# Patient Record
Sex: Female | Born: 1935
Health system: Southern US, Community
[De-identification: ages and names within clinical notes are randomized; demographics above are authoritative.]

## PROBLEM LIST (undated history)

## (undated) DIAGNOSIS — I2510 Atherosclerotic heart disease of native coronary artery without angina pectoris: Secondary | ICD-10-CM

## (undated) DIAGNOSIS — I679 Cerebrovascular disease, unspecified: Secondary | ICD-10-CM

## (undated) DIAGNOSIS — M069 Rheumatoid arthritis, unspecified: Secondary | ICD-10-CM

## (undated) DIAGNOSIS — E119 Type 2 diabetes mellitus without complications: Secondary | ICD-10-CM

## (undated) DIAGNOSIS — I639 Cerebral infarction, unspecified: Secondary | ICD-10-CM

## (undated) DIAGNOSIS — I10 Essential (primary) hypertension: Secondary | ICD-10-CM

## (undated) DIAGNOSIS — E785 Hyperlipidemia, unspecified: Secondary | ICD-10-CM

## (undated) DIAGNOSIS — N184 Chronic kidney disease, stage 4 (severe): Secondary | ICD-10-CM

## (undated) HISTORY — DX: Atherosclerotic heart disease of native coronary artery without angina pectoris: I25.10

## (undated) HISTORY — PX: OTHER SURGICAL HISTORY: SHX169

## (undated) HISTORY — DX: Rheumatoid arthritis, unspecified: M06.9

## (undated) HISTORY — DX: Type 2 diabetes mellitus without complications: E11.9

## (undated) HISTORY — DX: Cerebrovascular disease, unspecified: I67.9

## (undated) HISTORY — DX: Chronic kidney disease, stage 4 (severe): N18.4

## (undated) HISTORY — DX: Essential (primary) hypertension: I10

## (undated) HISTORY — PX: EYE SURGERY: SHX253

## (undated) HISTORY — DX: Hyperlipidemia, unspecified: E78.5

---

## 1997-05-11 ENCOUNTER — Other Ambulatory Visit: Admission: RE | Admit: 1997-05-11 | Discharge: 1997-05-11 | Payer: Self-pay | Admitting: Internal Medicine

## 1997-07-03 ENCOUNTER — Other Ambulatory Visit: Admission: RE | Admit: 1997-07-03 | Discharge: 1997-07-03 | Payer: Self-pay | Admitting: Internal Medicine

## 1997-07-26 ENCOUNTER — Ambulatory Visit (HOSPITAL_COMMUNITY): Admission: RE | Admit: 1997-07-26 | Discharge: 1997-07-26 | Payer: Self-pay | Admitting: Gastroenterology

## 1997-08-24 ENCOUNTER — Encounter: Admission: RE | Admit: 1997-08-24 | Discharge: 1997-11-22 | Payer: Self-pay | Admitting: Internal Medicine

## 1998-03-27 ENCOUNTER — Encounter: Admission: RE | Admit: 1998-03-27 | Discharge: 1998-06-25 | Payer: Self-pay | Admitting: Internal Medicine

## 1998-05-17 ENCOUNTER — Other Ambulatory Visit: Admission: RE | Admit: 1998-05-17 | Discharge: 1998-05-17 | Payer: Self-pay | Admitting: Internal Medicine

## 1998-05-18 ENCOUNTER — Ambulatory Visit (HOSPITAL_COMMUNITY): Admission: RE | Admit: 1998-05-18 | Discharge: 1998-05-18 | Payer: Self-pay | Admitting: Internal Medicine

## 1998-09-06 ENCOUNTER — Encounter: Admission: RE | Admit: 1998-09-06 | Discharge: 1998-09-17 | Payer: Self-pay | Admitting: Internal Medicine

## 1999-03-14 ENCOUNTER — Encounter: Admission: RE | Admit: 1999-03-14 | Discharge: 1999-06-12 | Payer: Self-pay | Admitting: Internal Medicine

## 1999-06-11 ENCOUNTER — Other Ambulatory Visit: Admission: RE | Admit: 1999-06-11 | Discharge: 1999-06-11 | Payer: Self-pay | Admitting: Internal Medicine

## 1999-08-15 ENCOUNTER — Encounter: Admission: RE | Admit: 1999-08-15 | Discharge: 1999-11-13 | Payer: Self-pay | Admitting: Internal Medicine

## 2000-03-02 ENCOUNTER — Encounter: Admission: RE | Admit: 2000-03-02 | Discharge: 2000-05-31 | Payer: Self-pay | Admitting: Internal Medicine

## 2000-06-25 ENCOUNTER — Other Ambulatory Visit: Admission: RE | Admit: 2000-06-25 | Discharge: 2000-06-25 | Payer: Self-pay | Admitting: Internal Medicine

## 2000-09-30 ENCOUNTER — Encounter: Admission: RE | Admit: 2000-09-30 | Discharge: 2000-12-29 | Payer: Self-pay | Admitting: Internal Medicine

## 2001-02-17 DIAGNOSIS — B029 Zoster without complications: Secondary | ICD-10-CM

## 2001-02-17 HISTORY — DX: Zoster without complications: B02.9

## 2001-03-03 ENCOUNTER — Encounter: Admission: RE | Admit: 2001-03-03 | Discharge: 2001-06-01 | Payer: Self-pay | Admitting: Internal Medicine

## 2001-04-07 ENCOUNTER — Encounter: Payer: Self-pay | Admitting: *Deleted

## 2001-04-07 ENCOUNTER — Inpatient Hospital Stay (HOSPITAL_COMMUNITY): Admission: EM | Admit: 2001-04-07 | Discharge: 2001-04-30 | Payer: Self-pay | Admitting: *Deleted

## 2001-04-09 ENCOUNTER — Encounter: Payer: Self-pay | Admitting: Cardiology

## 2001-04-15 ENCOUNTER — Encounter: Payer: Self-pay | Admitting: Surgery

## 2001-04-16 ENCOUNTER — Encounter: Payer: Self-pay | Admitting: Surgery

## 2001-04-17 ENCOUNTER — Encounter: Payer: Self-pay | Admitting: Surgery

## 2001-04-18 ENCOUNTER — Encounter: Payer: Self-pay | Admitting: Surgery

## 2001-04-19 ENCOUNTER — Encounter: Payer: Self-pay | Admitting: Surgery

## 2001-04-23 ENCOUNTER — Encounter: Payer: Self-pay | Admitting: Surgery

## 2001-04-26 ENCOUNTER — Encounter: Payer: Self-pay | Admitting: Cardiothoracic Surgery

## 2001-04-30 ENCOUNTER — Inpatient Hospital Stay (HOSPITAL_COMMUNITY)
Admission: RE | Admit: 2001-04-30 | Discharge: 2001-05-10 | Payer: Self-pay | Admitting: Physical Medicine & Rehabilitation

## 2001-05-11 ENCOUNTER — Encounter
Admission: RE | Admit: 2001-05-11 | Discharge: 2001-06-07 | Payer: Self-pay | Admitting: Physical Medicine & Rehabilitation

## 2001-05-25 ENCOUNTER — Encounter: Admission: RE | Admit: 2001-05-25 | Discharge: 2001-05-25 | Payer: Self-pay | Admitting: Surgery

## 2001-05-25 ENCOUNTER — Encounter: Payer: Self-pay | Admitting: Surgery

## 2001-10-06 ENCOUNTER — Encounter: Payer: Self-pay | Admitting: Emergency Medicine

## 2001-10-06 ENCOUNTER — Emergency Department (HOSPITAL_COMMUNITY): Admission: EM | Admit: 2001-10-06 | Discharge: 2001-10-06 | Payer: Self-pay | Admitting: Emergency Medicine

## 2001-10-07 ENCOUNTER — Inpatient Hospital Stay (HOSPITAL_COMMUNITY): Admission: AD | Admit: 2001-10-07 | Discharge: 2001-10-08 | Payer: Self-pay | Admitting: Neurology

## 2001-10-08 ENCOUNTER — Encounter: Payer: Self-pay | Admitting: Neurology

## 2001-10-08 ENCOUNTER — Encounter: Payer: Self-pay | Admitting: Cardiology

## 2001-10-22 ENCOUNTER — Encounter: Admission: RE | Admit: 2001-10-22 | Discharge: 2001-12-08 | Payer: Self-pay | Admitting: Neurology

## 2001-11-10 ENCOUNTER — Encounter: Admission: RE | Admit: 2001-11-10 | Discharge: 2002-02-08 | Payer: Self-pay | Admitting: Internal Medicine

## 2002-01-24 ENCOUNTER — Encounter: Admission: RE | Admit: 2002-01-24 | Discharge: 2002-04-24 | Payer: Self-pay | Admitting: Internal Medicine

## 2002-04-25 ENCOUNTER — Encounter: Admission: RE | Admit: 2002-04-25 | Discharge: 2002-07-24 | Payer: Self-pay | Admitting: Internal Medicine

## 2002-07-25 ENCOUNTER — Encounter: Admission: RE | Admit: 2002-07-25 | Discharge: 2002-10-23 | Payer: Self-pay | Admitting: Internal Medicine

## 2003-01-25 ENCOUNTER — Encounter: Admission: RE | Admit: 2003-01-25 | Discharge: 2003-04-25 | Payer: Self-pay | Admitting: Internal Medicine

## 2003-05-01 ENCOUNTER — Encounter: Admission: RE | Admit: 2003-05-01 | Discharge: 2003-07-30 | Payer: Self-pay | Admitting: Internal Medicine

## 2003-05-24 ENCOUNTER — Other Ambulatory Visit: Admission: RE | Admit: 2003-05-24 | Discharge: 2003-05-24 | Payer: Self-pay | Admitting: Internal Medicine

## 2003-07-31 ENCOUNTER — Encounter: Admission: RE | Admit: 2003-07-31 | Discharge: 2003-10-29 | Payer: Self-pay | Admitting: Internal Medicine

## 2003-11-29 ENCOUNTER — Encounter: Admission: RE | Admit: 2003-11-29 | Discharge: 2003-11-29 | Payer: Self-pay | Admitting: Internal Medicine

## 2004-04-22 ENCOUNTER — Ambulatory Visit: Payer: Self-pay | Admitting: Cardiology

## 2004-05-01 ENCOUNTER — Ambulatory Visit: Payer: Self-pay

## 2004-05-24 ENCOUNTER — Encounter: Admission: RE | Admit: 2004-05-24 | Discharge: 2004-08-22 | Payer: Self-pay | Admitting: Internal Medicine

## 2004-05-29 ENCOUNTER — Other Ambulatory Visit: Admission: RE | Admit: 2004-05-29 | Discharge: 2004-05-29 | Payer: Self-pay | Admitting: Internal Medicine

## 2004-07-16 ENCOUNTER — Ambulatory Visit: Payer: Self-pay | Admitting: Cardiology

## 2004-08-27 ENCOUNTER — Ambulatory Visit: Payer: Self-pay | Admitting: Cardiology

## 2004-10-16 ENCOUNTER — Ambulatory Visit: Payer: Self-pay | Admitting: Cardiology

## 2004-11-22 ENCOUNTER — Encounter: Admission: RE | Admit: 2004-11-22 | Discharge: 2004-11-22 | Payer: Self-pay | Admitting: Internal Medicine

## 2005-01-19 ENCOUNTER — Emergency Department (HOSPITAL_COMMUNITY): Admission: EM | Admit: 2005-01-19 | Discharge: 2005-01-19 | Payer: Self-pay | Admitting: Emergency Medicine

## 2005-01-19 IMAGING — CR DG CHEST 2V
2 series · 2 of 2 positions shown · non-contrast
Comparison: none

CLINICAL DATA: Altered mental status, weakness.  Coronary artery disease, hypertension and diabetes.
 CHEST - 2 VIEW:

[w chest pa]
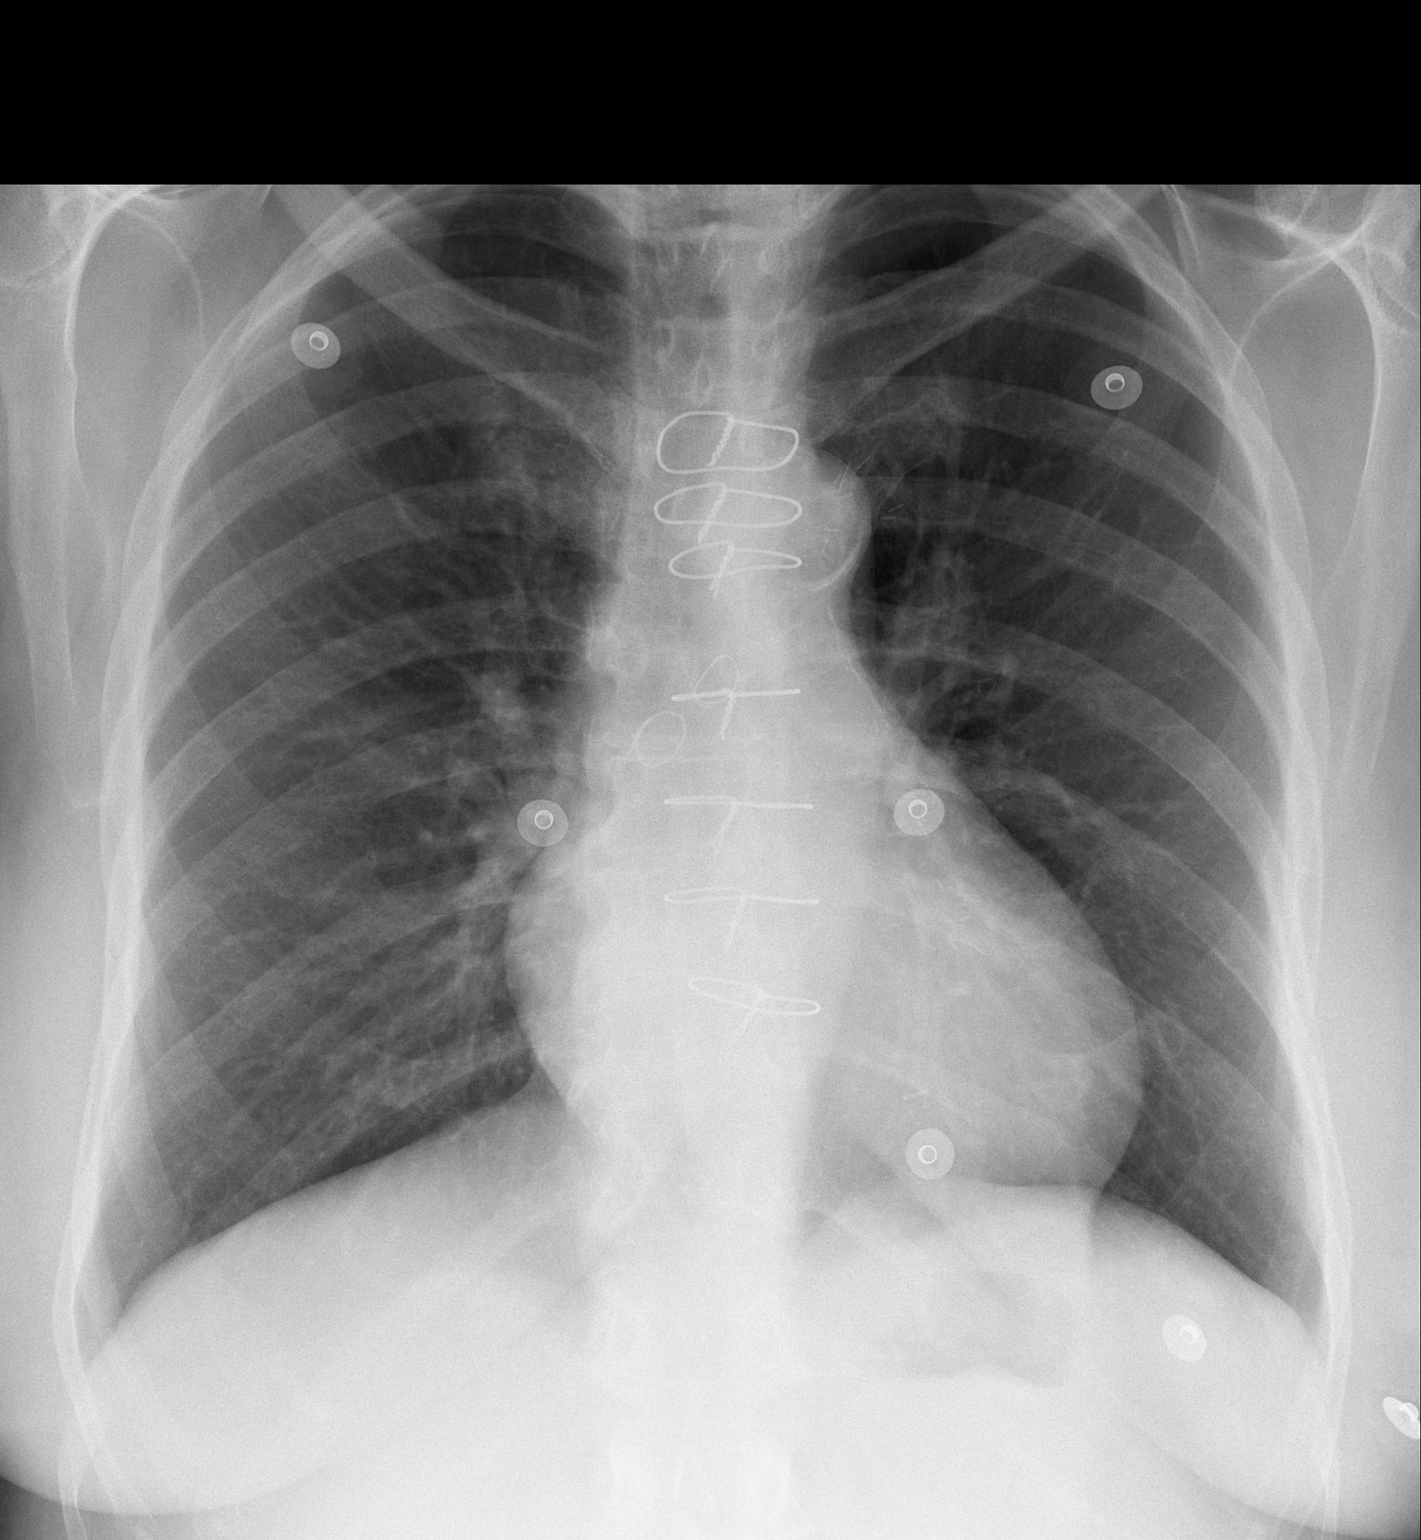

[w chest lat]
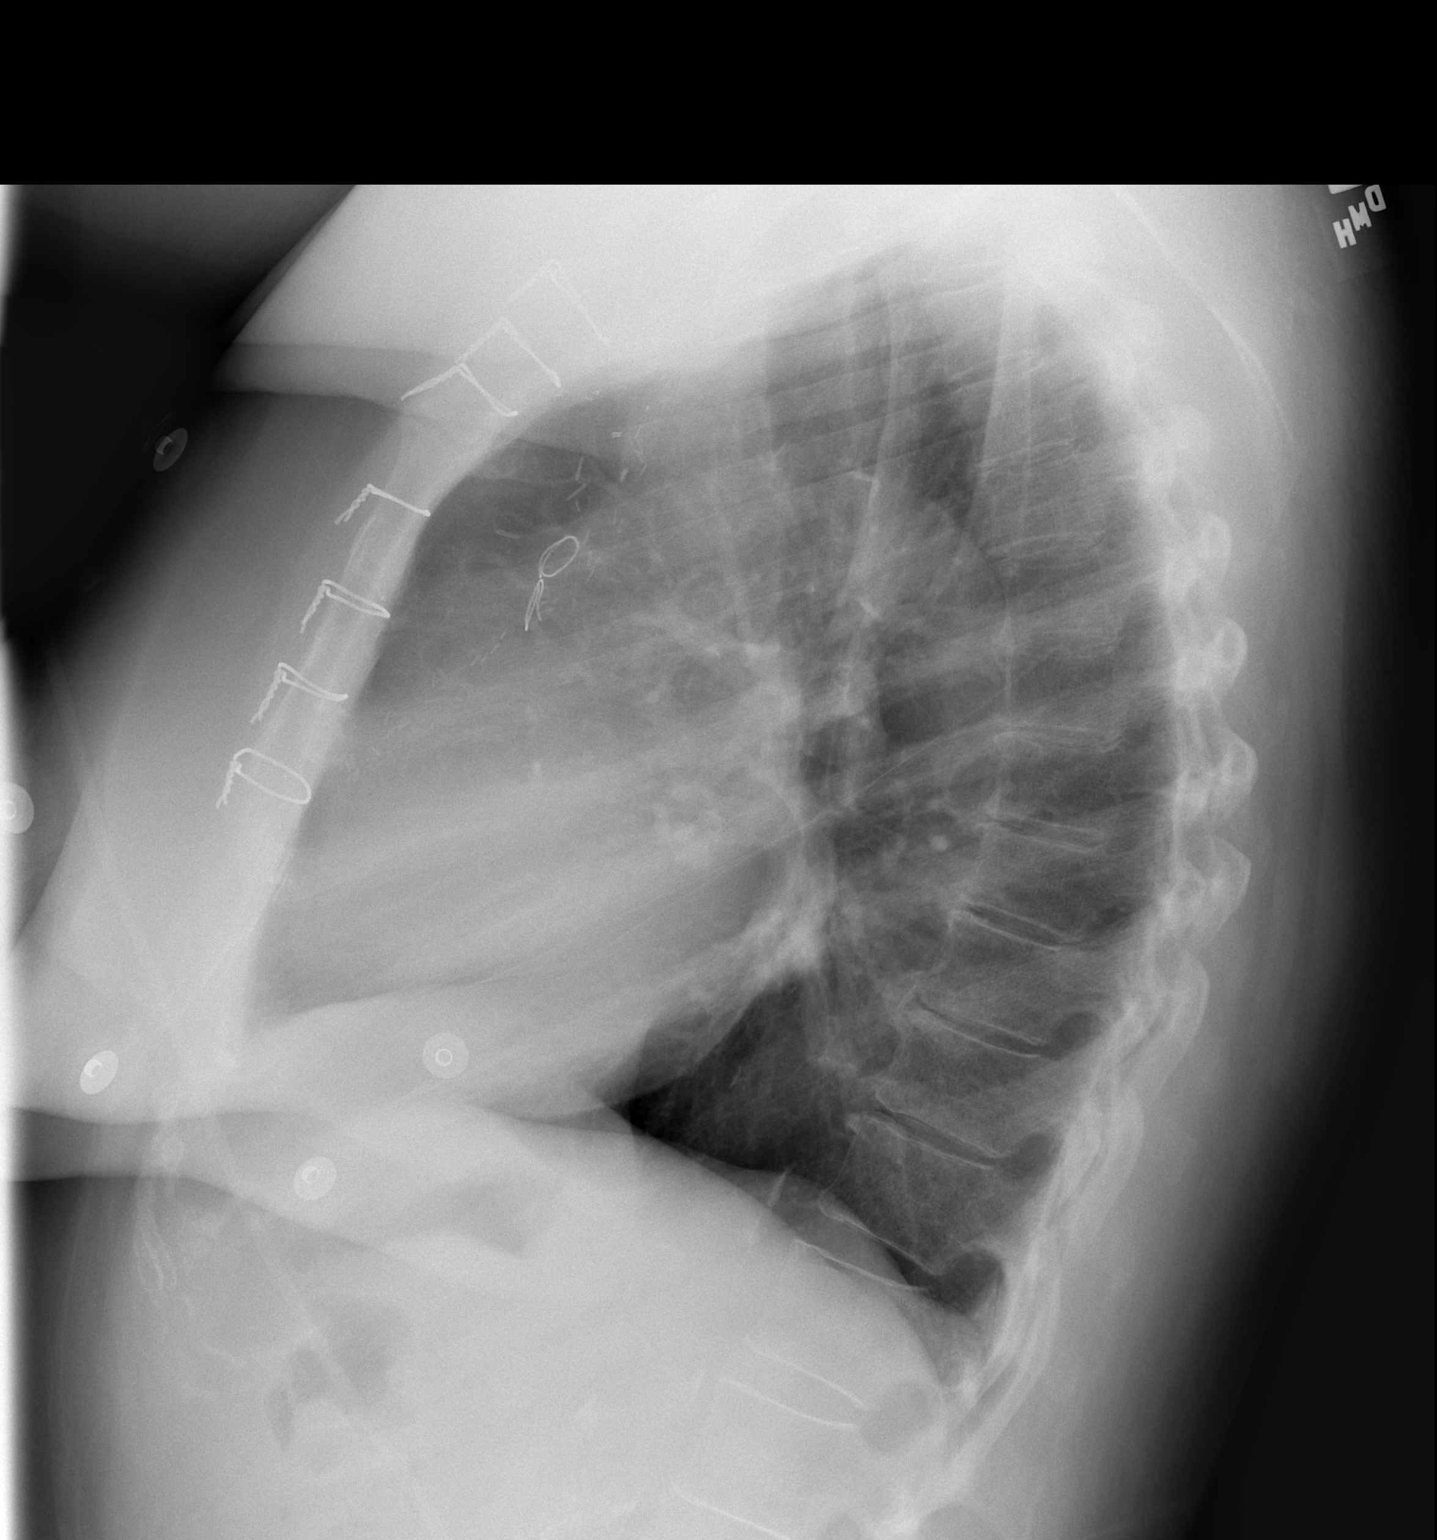

[2 of 2 positions shown; findings below may reference images not displayed]

FINDINGS: Heart size is at the upper limits of normal.  Pulmonary vascularity is normal.  Both lungs are clear.  There is no evidence of pleural effusion.  No mass or adenopathy identified.
IMPRESSION: No active disease.

## 2005-05-07 ENCOUNTER — Ambulatory Visit: Payer: Self-pay | Admitting: Cardiology

## 2005-05-27 ENCOUNTER — Encounter: Admission: RE | Admit: 2005-05-27 | Discharge: 2005-05-27 | Payer: Self-pay | Admitting: Internal Medicine

## 2005-06-05 ENCOUNTER — Other Ambulatory Visit: Admission: RE | Admit: 2005-06-05 | Discharge: 2005-06-05 | Payer: Self-pay | Admitting: Internal Medicine

## 2005-12-02 ENCOUNTER — Encounter: Admission: RE | Admit: 2005-12-02 | Discharge: 2006-03-02 | Payer: Self-pay | Admitting: Internal Medicine

## 2006-06-03 ENCOUNTER — Encounter: Admission: RE | Admit: 2006-06-03 | Discharge: 2006-09-01 | Payer: Self-pay | Admitting: Internal Medicine

## 2006-12-03 ENCOUNTER — Encounter: Admission: RE | Admit: 2006-12-03 | Discharge: 2006-12-03 | Payer: Self-pay | Admitting: Internal Medicine

## 2007-06-03 ENCOUNTER — Encounter: Admission: RE | Admit: 2007-06-03 | Discharge: 2007-06-03 | Payer: Self-pay | Admitting: Internal Medicine

## 2007-12-02 ENCOUNTER — Encounter: Admission: RE | Admit: 2007-12-02 | Discharge: 2007-12-02 | Payer: Self-pay | Admitting: Internal Medicine

## 2008-05-11 ENCOUNTER — Encounter (INDEPENDENT_AMBULATORY_CARE_PROVIDER_SITE_OTHER): Payer: Self-pay | Admitting: *Deleted

## 2008-05-11 DIAGNOSIS — M069 Rheumatoid arthritis, unspecified: Secondary | ICD-10-CM | POA: Insufficient documentation

## 2008-05-11 DIAGNOSIS — I679 Cerebrovascular disease, unspecified: Secondary | ICD-10-CM

## 2008-05-11 DIAGNOSIS — E785 Hyperlipidemia, unspecified: Secondary | ICD-10-CM | POA: Insufficient documentation

## 2008-05-11 DIAGNOSIS — I2510 Atherosclerotic heart disease of native coronary artery without angina pectoris: Secondary | ICD-10-CM | POA: Insufficient documentation

## 2008-05-11 DIAGNOSIS — I10 Essential (primary) hypertension: Secondary | ICD-10-CM | POA: Insufficient documentation

## 2008-05-12 ENCOUNTER — Encounter: Payer: Self-pay | Admitting: Cardiology

## 2008-05-12 ENCOUNTER — Ambulatory Visit: Payer: Self-pay | Admitting: Cardiology

## 2008-05-12 DIAGNOSIS — I2119 ST elevation (STEMI) myocardial infarction involving other coronary artery of inferior wall: Secondary | ICD-10-CM | POA: Insufficient documentation

## 2008-05-12 DIAGNOSIS — I2510 Atherosclerotic heart disease of native coronary artery without angina pectoris: Secondary | ICD-10-CM | POA: Insufficient documentation

## 2008-05-16 ENCOUNTER — Ambulatory Visit: Payer: Self-pay | Admitting: Internal Medicine

## 2008-05-16 ENCOUNTER — Inpatient Hospital Stay (HOSPITAL_COMMUNITY): Admission: EM | Admit: 2008-05-16 | Discharge: 2008-05-17 | Payer: Self-pay | Admitting: Emergency Medicine

## 2008-05-16 IMAGING — CR DG CHEST 2V
2 series · 2 of 2 positions shown · non-contrast
Comparison: [DATE].

CLINICAL DATA: Chest pain.

CHEST - 2 VIEW

[w chest pa]
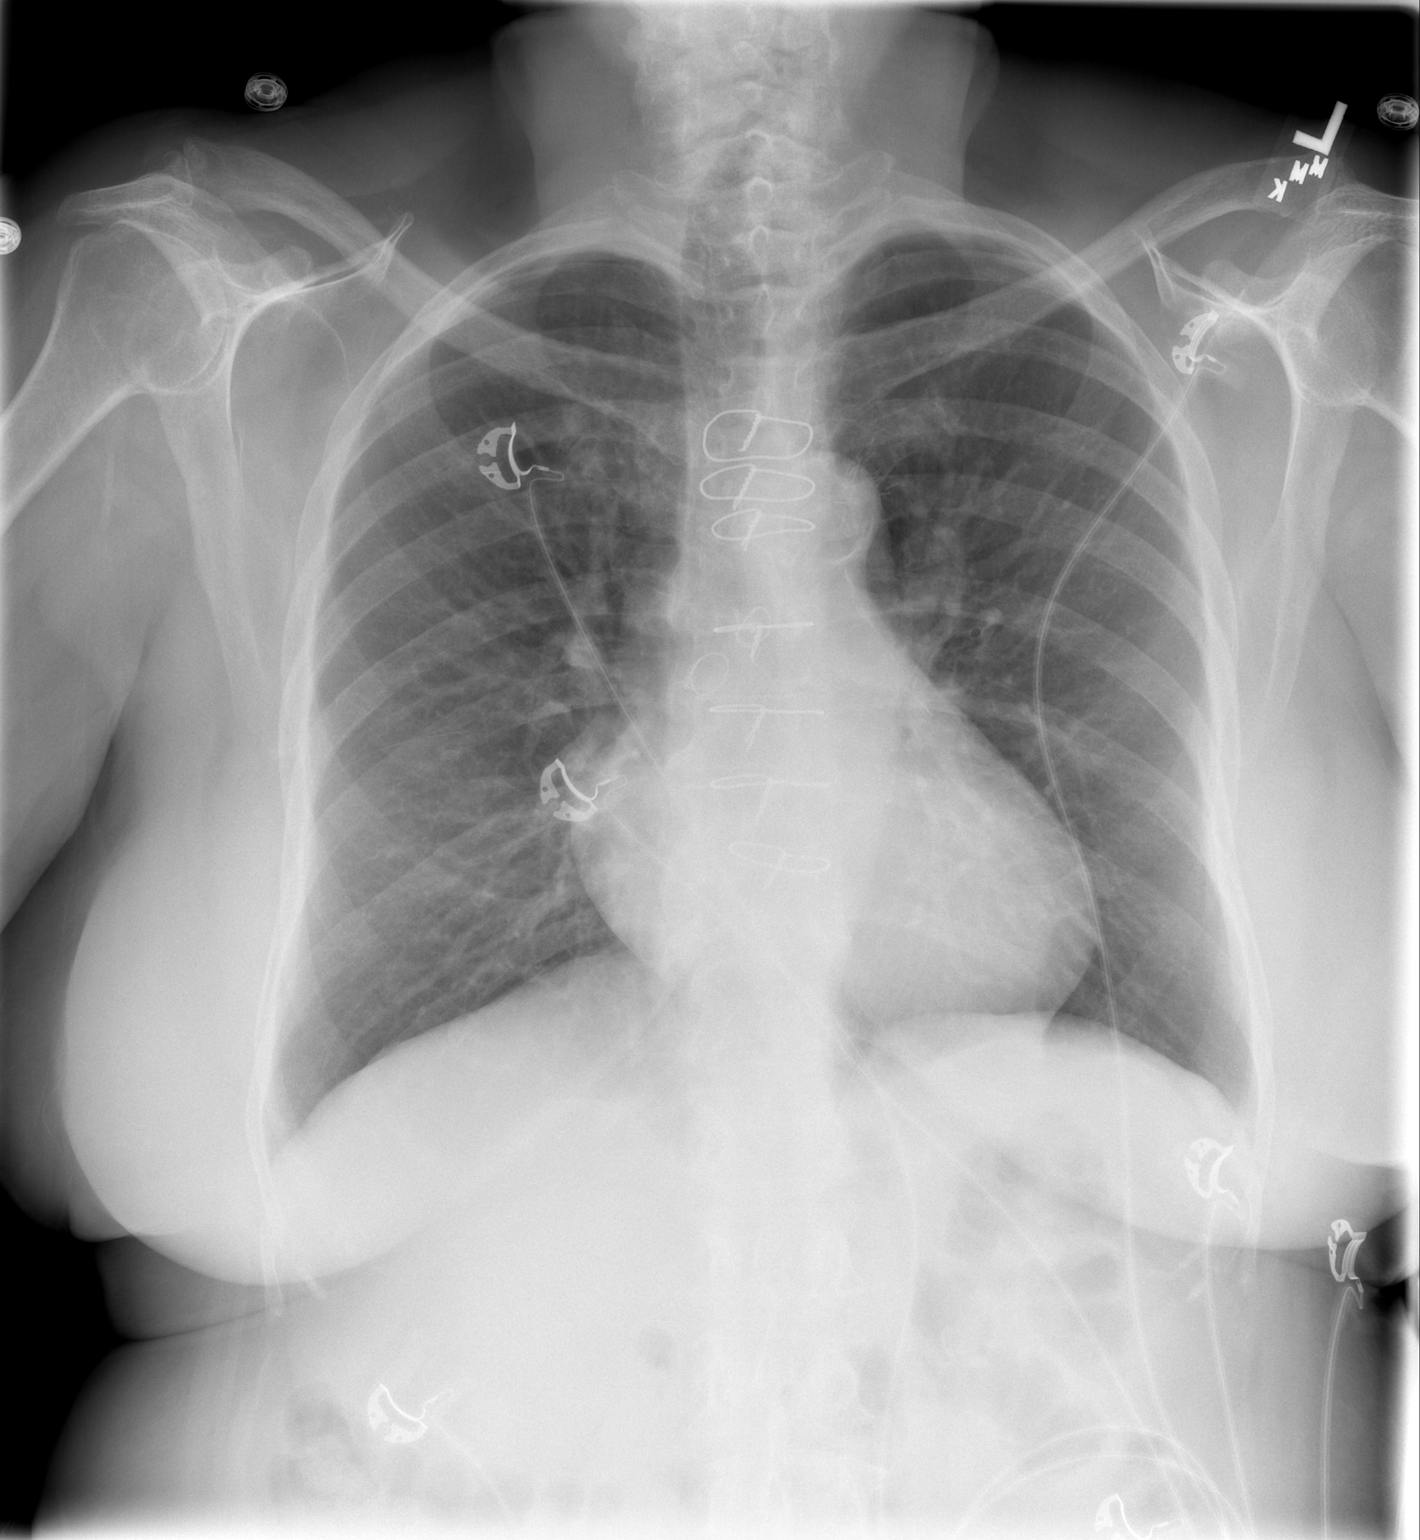

[w chest lat]
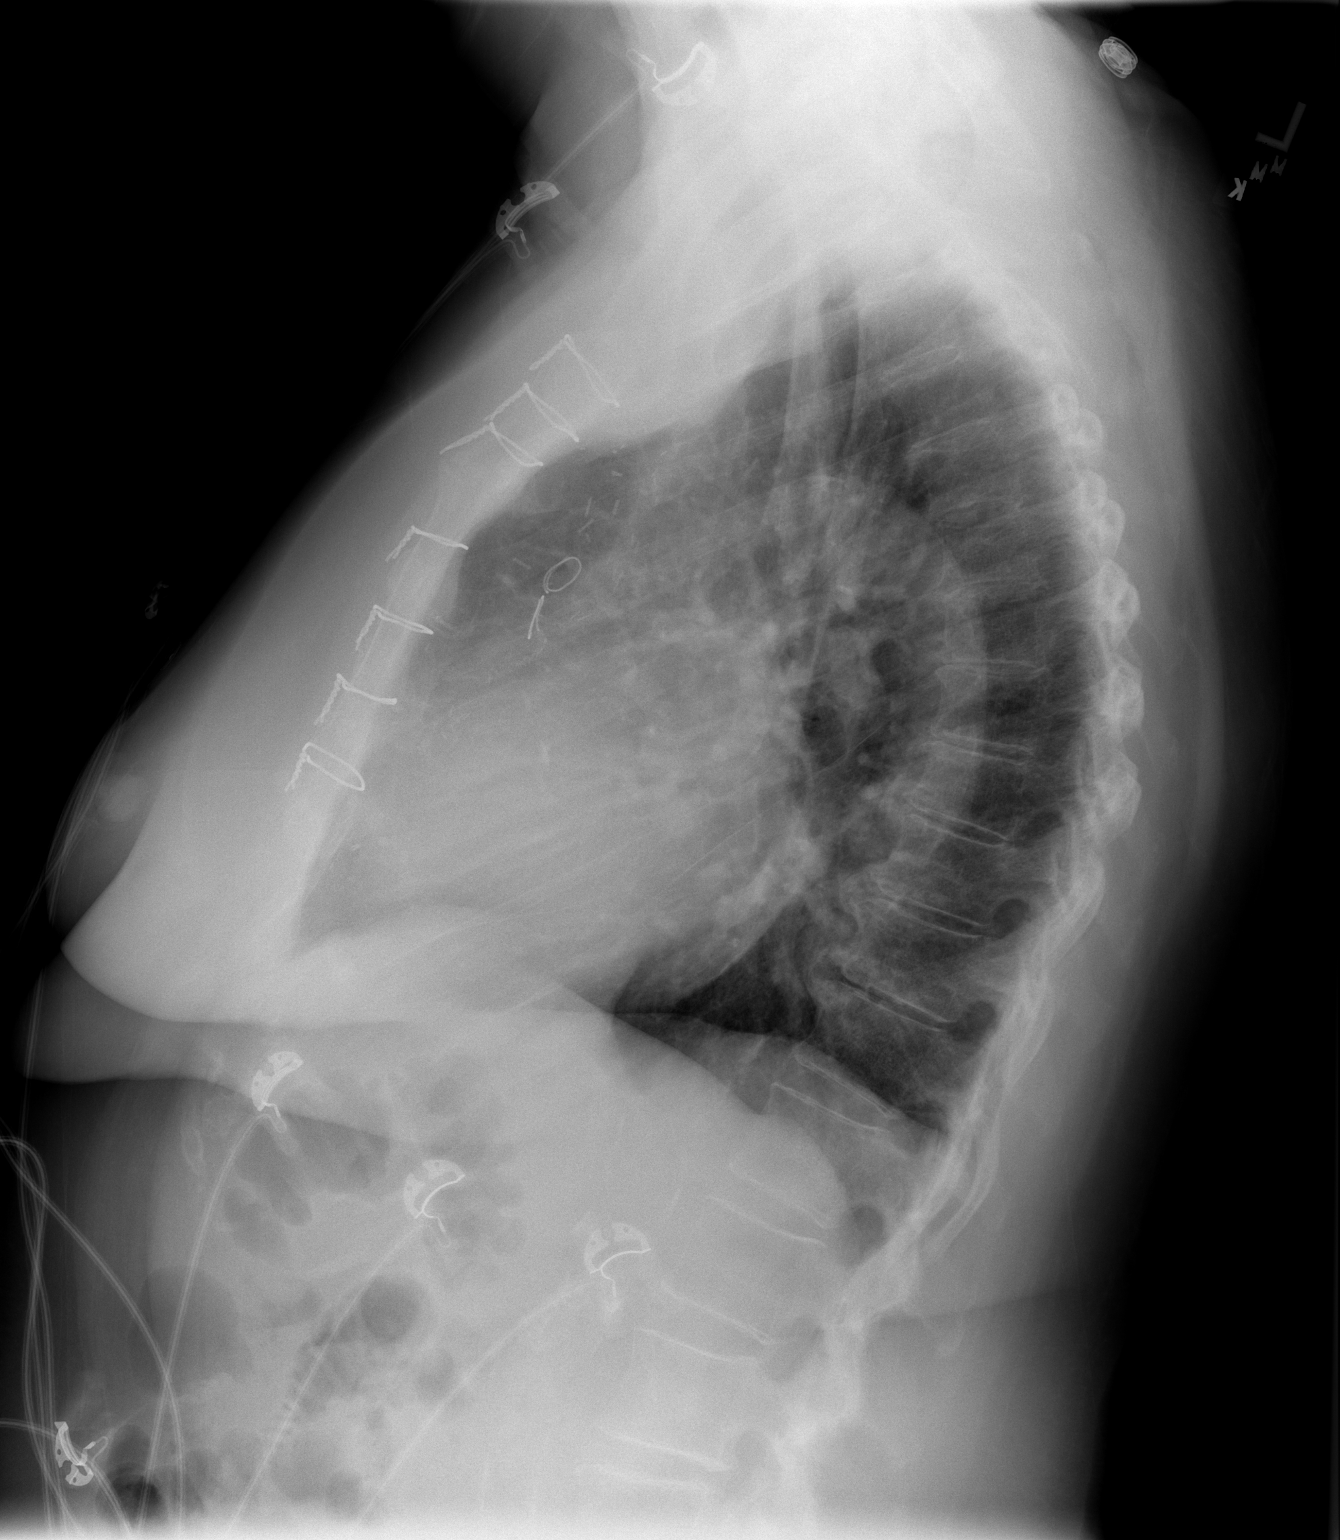

[2 of 2 positions shown; findings below may reference images not displayed]

FINDINGS: Postop CABG.  Heart size is mildly enlarged.  There is no
heart failure.  The lungs are clear there is no infiltrate effusion
or mass.
IMPRESSION: No active cardiopulmonary disease.

## 2008-05-25 ENCOUNTER — Ambulatory Visit: Payer: Self-pay

## 2008-05-30 ENCOUNTER — Telehealth (INDEPENDENT_AMBULATORY_CARE_PROVIDER_SITE_OTHER): Payer: Self-pay | Admitting: *Deleted

## 2008-05-31 ENCOUNTER — Ambulatory Visit: Payer: Self-pay

## 2008-05-31 ENCOUNTER — Encounter: Payer: Self-pay | Admitting: Cardiology

## 2008-05-31 ENCOUNTER — Ambulatory Visit: Payer: Self-pay | Admitting: Internal Medicine

## 2008-05-31 ENCOUNTER — Encounter: Payer: Self-pay | Admitting: Physician Assistant

## 2008-06-01 ENCOUNTER — Encounter: Admission: RE | Admit: 2008-06-01 | Discharge: 2008-06-01 | Payer: Self-pay | Admitting: Internal Medicine

## 2008-09-28 ENCOUNTER — Encounter (INDEPENDENT_AMBULATORY_CARE_PROVIDER_SITE_OTHER): Payer: Self-pay | Admitting: *Deleted

## 2008-11-17 ENCOUNTER — Encounter (INDEPENDENT_AMBULATORY_CARE_PROVIDER_SITE_OTHER): Payer: Self-pay | Admitting: *Deleted

## 2008-11-20 ENCOUNTER — Encounter: Admission: RE | Admit: 2008-11-20 | Discharge: 2008-11-20 | Payer: Self-pay | Admitting: Internal Medicine

## 2008-11-28 ENCOUNTER — Ambulatory Visit: Payer: Self-pay | Admitting: Cardiology

## 2009-01-22 ENCOUNTER — Encounter: Payer: Self-pay | Admitting: Cardiology

## 2009-02-20 ENCOUNTER — Telehealth: Payer: Self-pay | Admitting: Cardiology

## 2009-02-21 ENCOUNTER — Telehealth: Payer: Self-pay | Admitting: Cardiology

## 2009-04-26 ENCOUNTER — Inpatient Hospital Stay (HOSPITAL_COMMUNITY): Admission: EM | Admit: 2009-04-26 | Discharge: 2009-04-27 | Payer: Self-pay | Admitting: Emergency Medicine

## 2009-04-26 IMAGING — CR DG ABDOMEN 1V
2 series · 2 of 2 positions shown · non-contrast
Comparison: None

CLINICAL DATA: Rule out bowel obstruction

ABDOMEN - 1 VIEW

[t abdomen supine (1 of 2)]
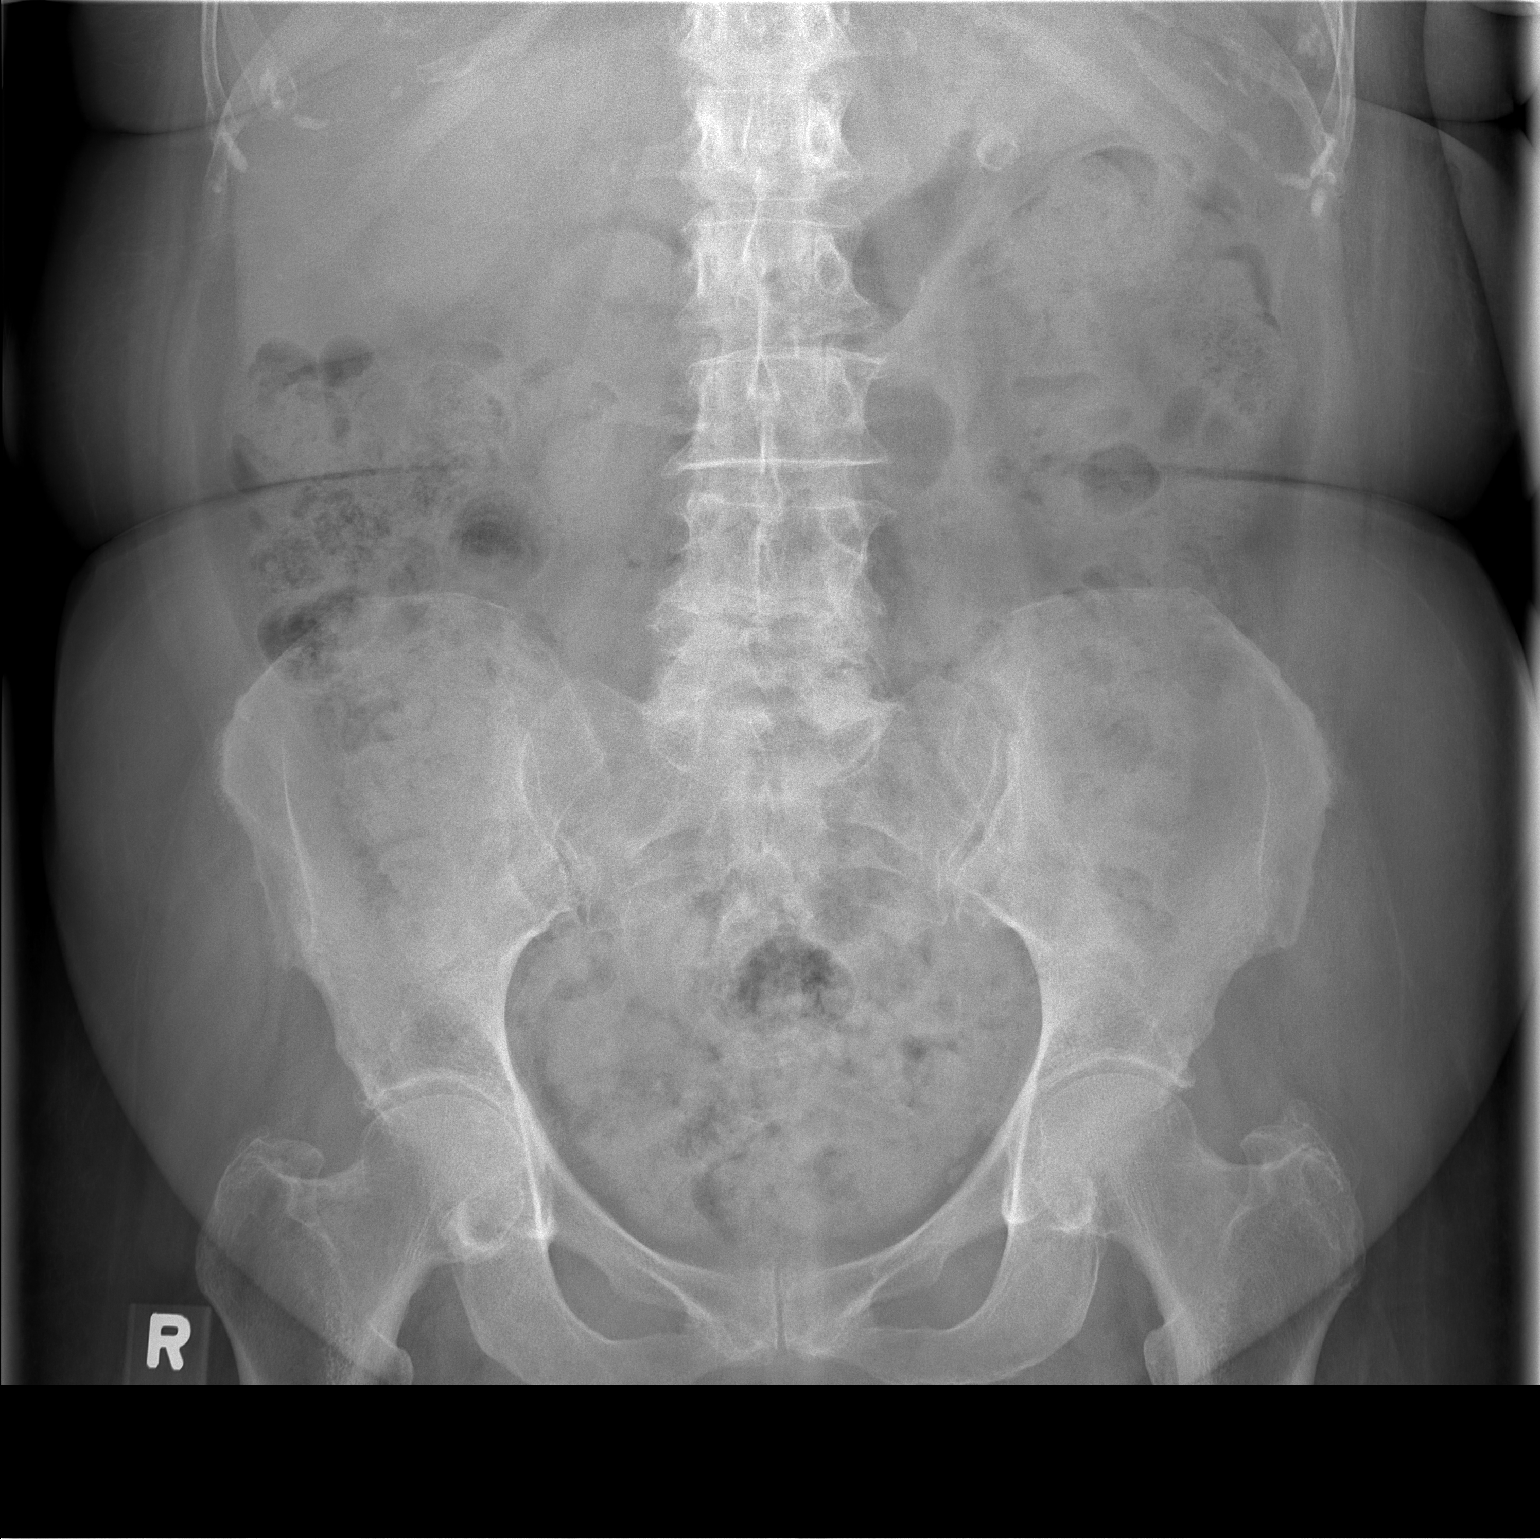

[t abdomen supine (2 of 2)]
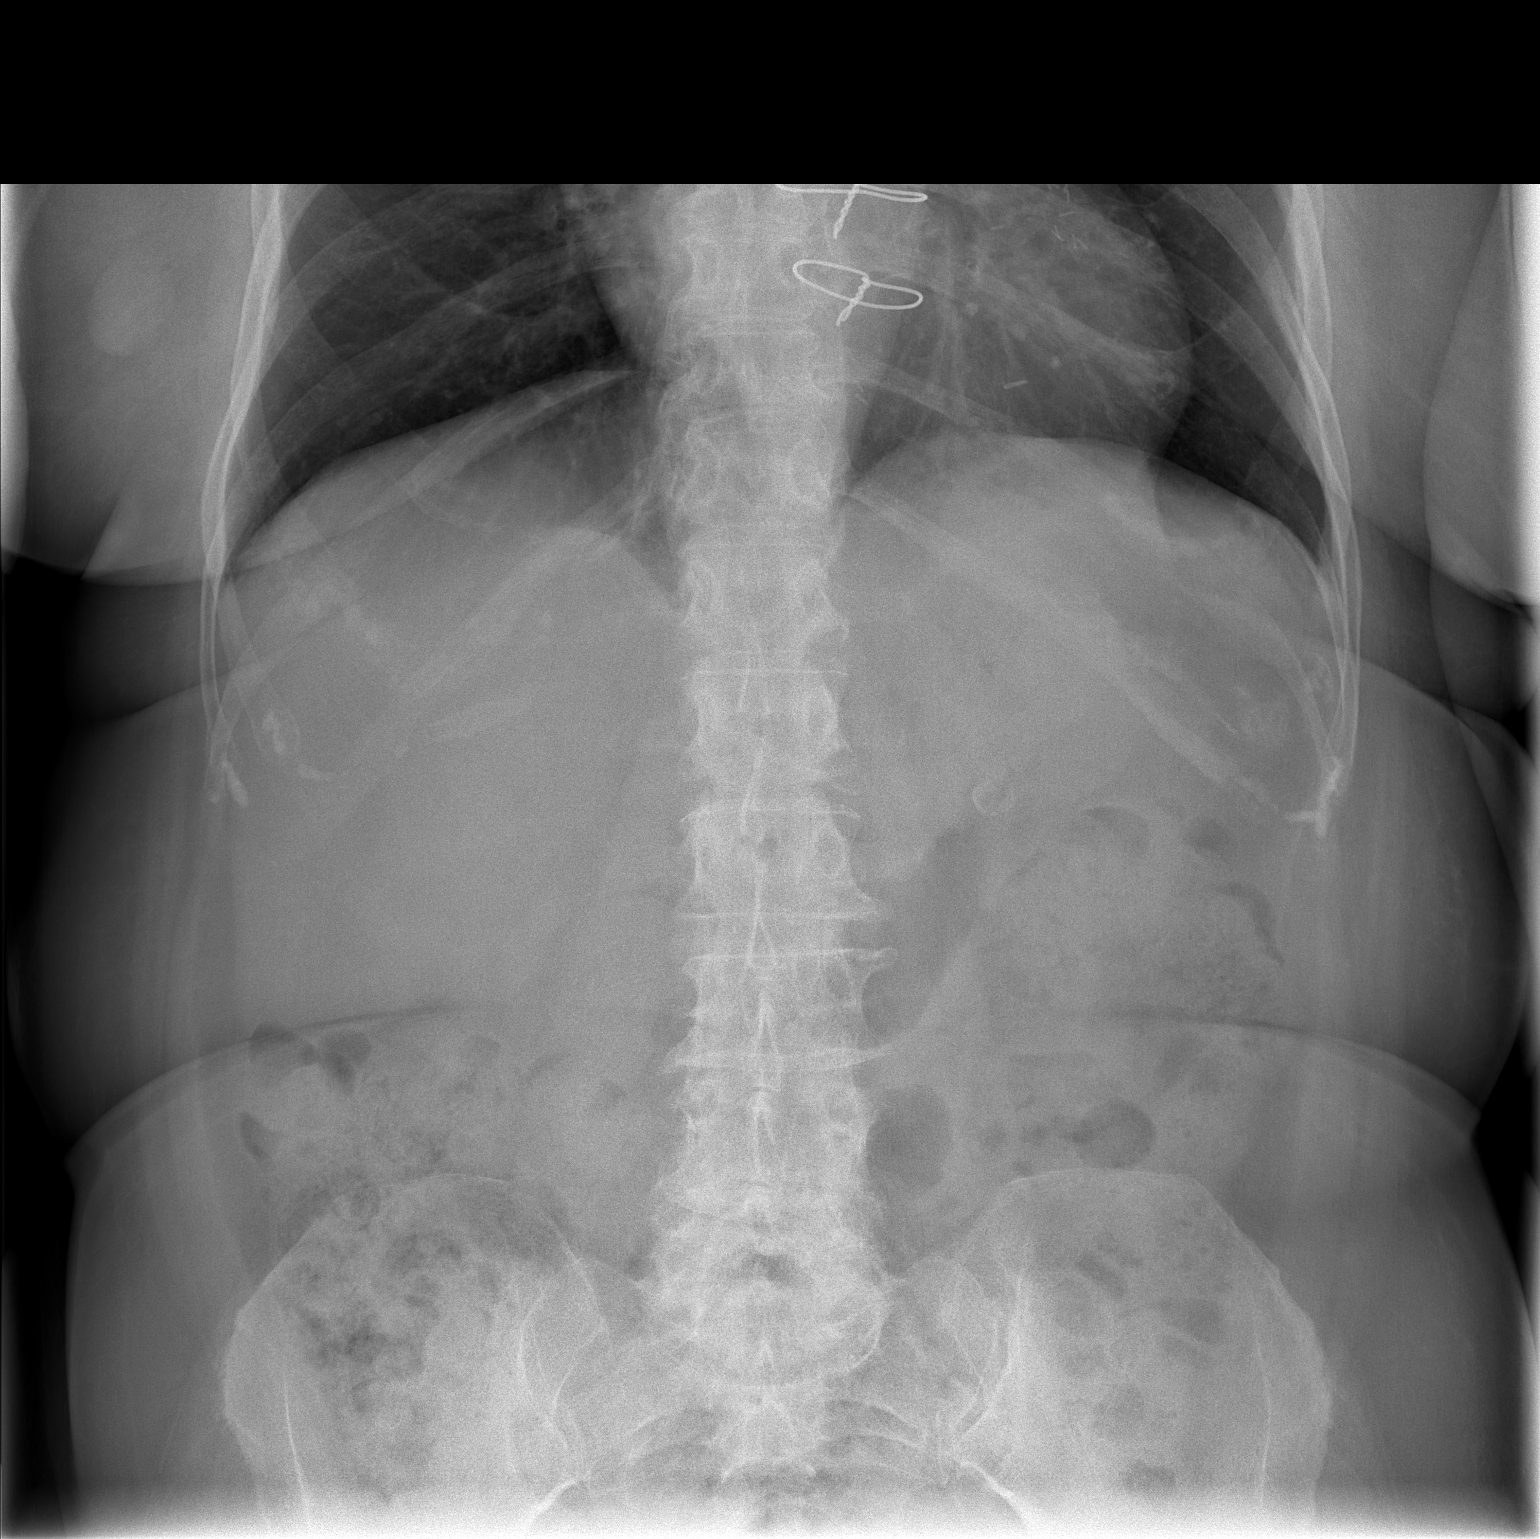

[2 of 2 positions shown; findings below may reference images not displayed]

FINDINGS: No dilated loops of small bowel or air-fluid levels
noted.

There is a moderate amount of retained stool identified throughout
the colon and rectum.

There is a rounded calcific density in the left upper quadrant of
the abdomen measuring 1.2 cm.
IMPRESSION: 1.  No evidence for high-grade bowel obstruction.
2.  Calcified lesion in the left upper quadrant the abdomen likely
represents a splenic artery aneurysm.

## 2009-05-08 ENCOUNTER — Ambulatory Visit: Payer: Self-pay | Admitting: Cardiology

## 2009-05-22 ENCOUNTER — Encounter: Admission: RE | Admit: 2009-05-22 | Discharge: 2009-05-22 | Payer: Self-pay | Admitting: Internal Medicine

## 2009-05-30 ENCOUNTER — Encounter: Admission: RE | Admit: 2009-05-30 | Discharge: 2009-08-15 | Payer: Self-pay | Admitting: Internal Medicine

## 2009-05-31 ENCOUNTER — Ambulatory Visit: Payer: Self-pay

## 2009-05-31 ENCOUNTER — Encounter: Payer: Self-pay | Admitting: Cardiology

## 2009-12-11 ENCOUNTER — Ambulatory Visit (HOSPITAL_COMMUNITY): Admission: RE | Admit: 2009-12-11 | Discharge: 2009-12-11 | Payer: Self-pay | Admitting: Internal Medicine

## 2009-12-11 IMAGING — CR DG CHEST 2V
2 series · 2 of 2 positions shown · non-contrast
Comparison: [DATE]

CLINICAL DATA: Cough and S O B

CHEST - 2 VIEW

[view not recorded (1 of 2)]
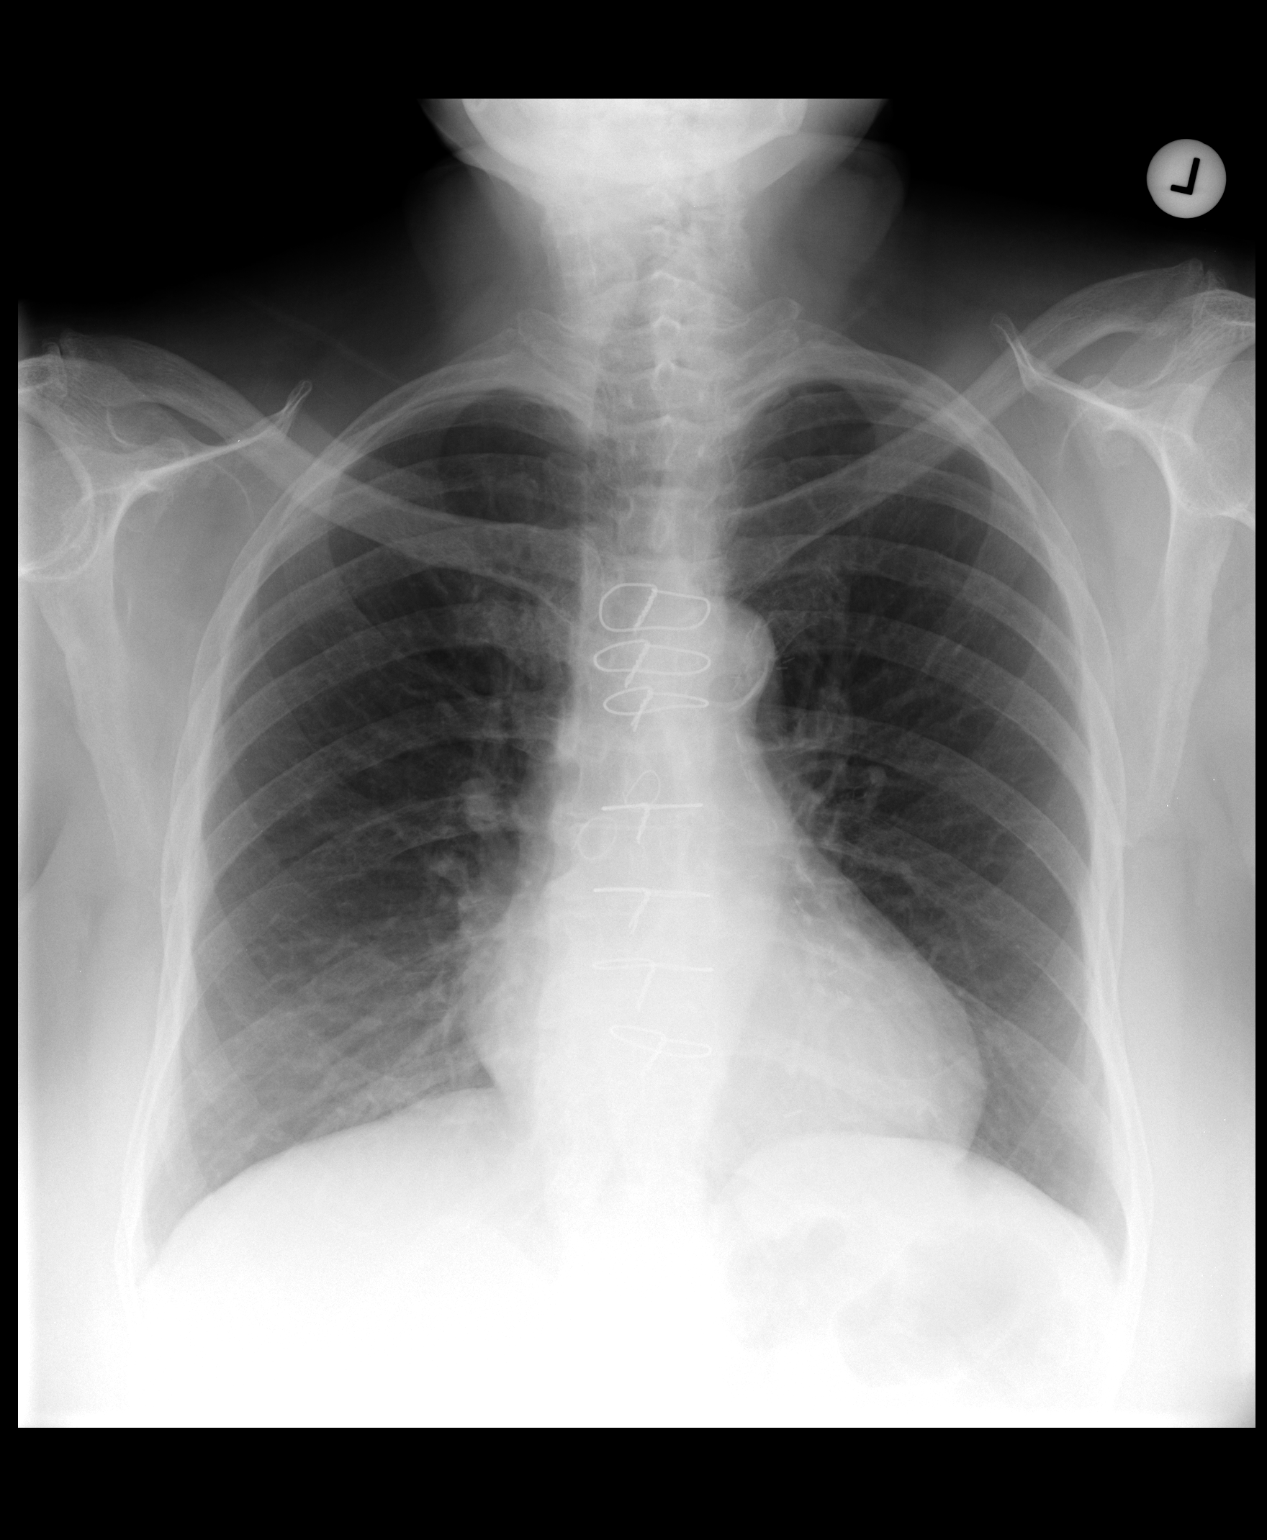

[view not recorded (2 of 2)]
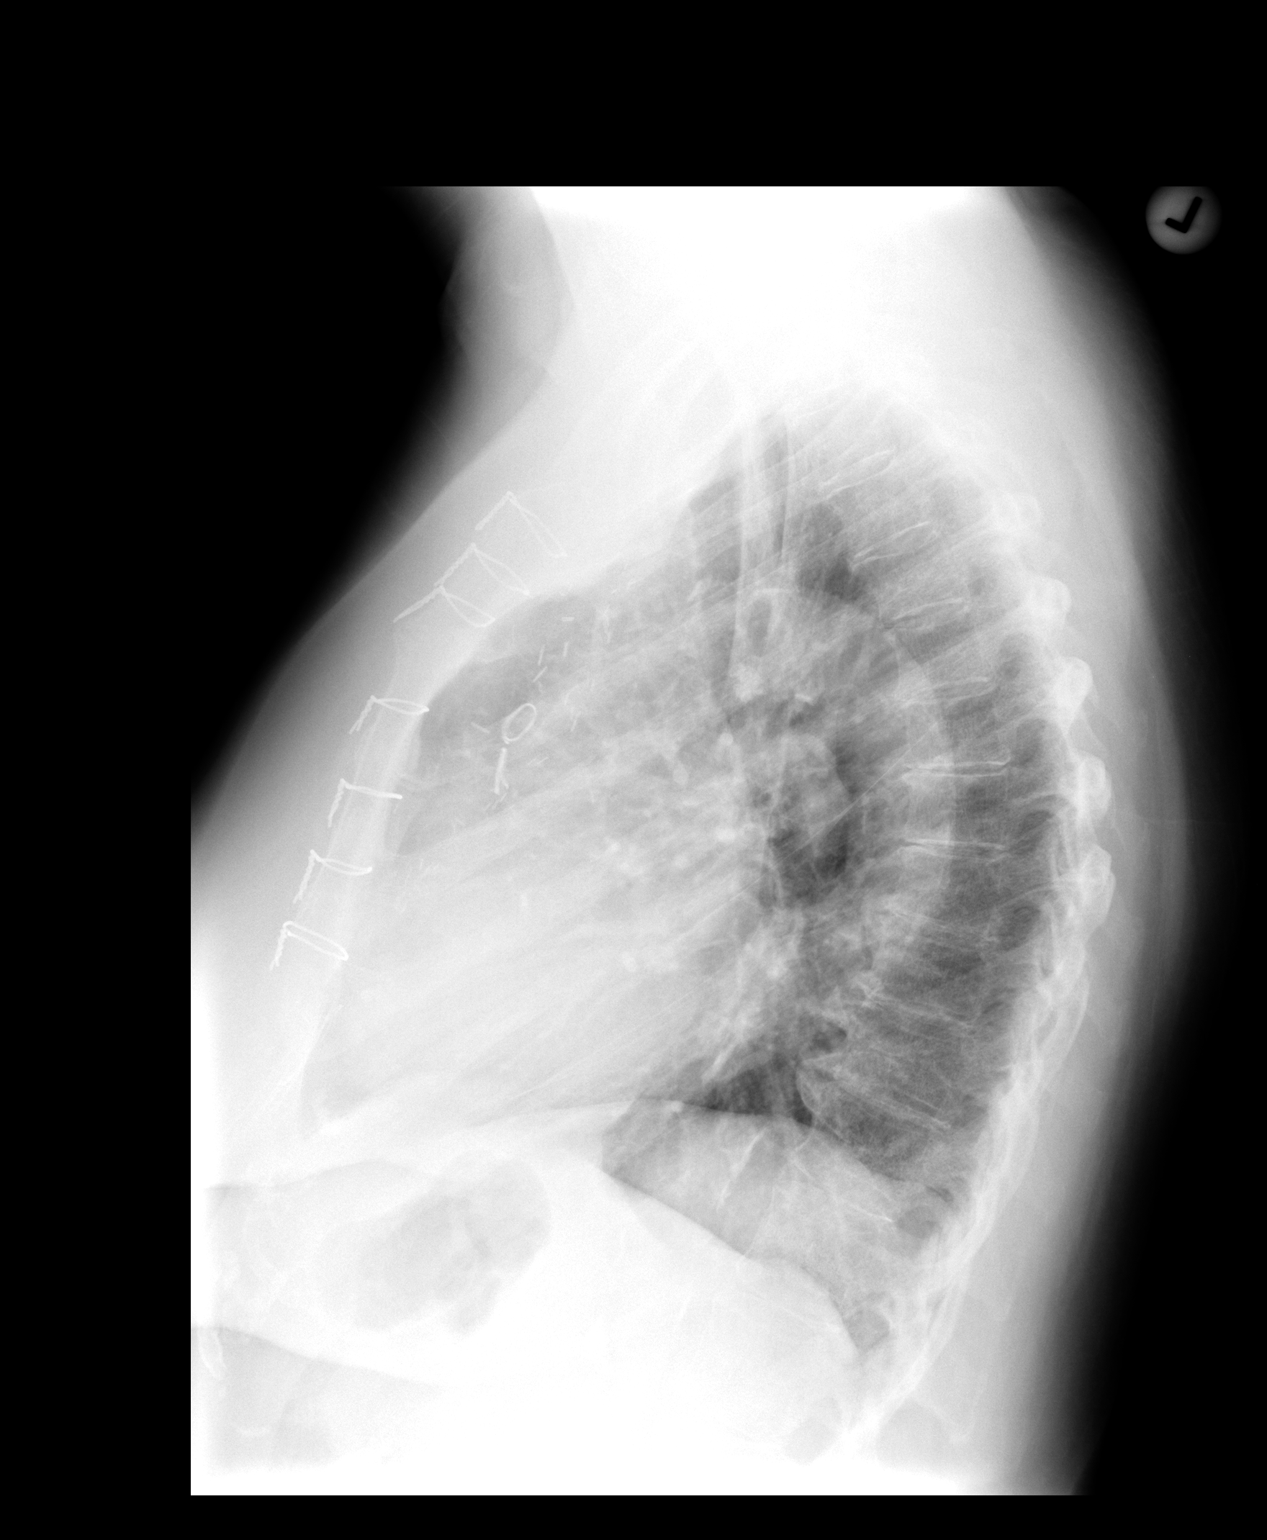

[2 of 2 positions shown; findings below may reference images not displayed]

FINDINGS: The patient is status post median sternotomy and CABG
procedure.

Mild cardiac enlargement.

No pleural effusion or pulmonary edema noted.  No airspace
consolidation  identified.

Mild thoracic spondylosis is identified within the spine.
IMPRESSION: 1.  No active cardiopulmonary abnormalities.

## 2010-03-11 ENCOUNTER — Telehealth: Payer: Self-pay | Admitting: Cardiology

## 2010-03-21 NOTE — Miscellaneous (Signed)
Summary: Orders Update  Clinical Lists Changes  Orders: Added new Test order of Carotid Duplex (Carotid Duplex) - Signed 

## 2010-03-21 NOTE — Progress Notes (Signed)
Summary: RX SENT TO [facility]  Phone Note Refill Request Message from:  Patient on March 11, 2010 10:33 AM  Refills Requested: Medication #1:  CRESTOR 5 MG TABS 1 once daily please send this into [facility].    Method Requested: Fax to [facility] Initial call taken by: Regan Lemming,  March 11, 2010 10:33 AM    Prescriptions: CRESTOR 5 MG TABS (ROSUVASTATIN CALCIUM) 1 once daily  #90 x 3   Entered by:   Julaine Hua, CMA   Authorized by:   Renella Cunas, MD, [facility]   Signed by:   Julaine Hua, CMA on 03/11/2010   Method used:   Faxed to ...       [facility] (mail-order)             , [place]         Ph: [phone]       Fax: [phone]   RxID:   [id number]

## 2010-03-21 NOTE — Progress Notes (Signed)
Summary: clarify medications  Phone Note Call from Patient Call back at Home Phone [phone]   Caller: Patient Summary of Call: Please call and speak to patient regarding medications.  RN spoke to her son yesterday, however, patient would like to clarify information. Initial call taken by: Alexander Bergeron,  February 21, 2009 10:40 AM  Follow-up for Phone Call        lmtcb Devra Dopp, LPN  January  5, 624THL 1:36 PM  RETURNING Janna Arch  February 22, 2009 10:19 AM   SPOKE WITH PT REQUESTED REFILL ON CRESTOR CALLED IN TO [facility] PER PT  Follow-up by: Devra Dopp, LPN,  January  6, 624THL 10:44 AM

## 2010-03-21 NOTE — Assessment & Plan Note (Signed)
Summary: yearly/sl   Visit Type:  1 yr  Primary Provider:  Jeanann Lewandowsky MD   History of Present Illness: [name] returns today for evaluation of her coronary disease.  She's having no angina or ischemic symptoms. She had an episode of syncope requiring hospitalization this year from hypoglycemia.  She had cardiac catheterization last year which showed all of her grafts to be patent. She has good left ventricular systolic function.  Laboratory data from Dr. Ainsley Spinner office reviewed from December. Total cholesterol is 106 triglycerides 117 HDL 26 LDL 57. Hemoglobin A1c was 6.3%.  Current Medications (verified): 1)  Norvasc 5 Mg Tabs (Amlodipine Besylate) .[name].. 1 Tab Once Daily 2)  Actos 15 Mg Tabs (Pioglitazone Hcl) .[name].. 1 Tab Once Daily 3)  Aggrenox 25-200 Mg Xr12h-Cap (Aspirin-Dipyridamole) .[name].. 1 Tab Two Times A Day 4)  Diovan Hct 160-12.5 Mg Tabs (Valsartan-Hydrochlorothiazide) .[name].. 1 Tab Once Daily 5)  Multivitamins   Tabs (Multiple Vitamin) .[name].. 1 Tab Once Daily 6)  Glimepiride 2 Mg Tabs (Glimepiride) .[name].. 1 Tab Once Daily 7)  Crestor 5 Mg Tabs (Rosuvastatin Calcium) .[name].. 1 Once Daily 8)  Caltrate 600+d 600-400 Mg-Unit Tabs (Calcium Carbonate-Vitamin D) .[name].. 1 Tab Once Daily 9)  Carvedilol 12.5 Mg Tabs (Carvedilol) .[name].. 1 Tab Two Times A Day 10)  Fish Oil 1000 Mg Caps (Omega-3 Fatty Acids) .[name].. 1 Cap Two Times A Day 11)  Apap 500 Mg Caps (Acetaminophen) .[name].. 1 Tab Two Times A Day 12)  Glucosamine-Chondroitin 1500-1200 Mg/62ml Liqd (Glucosamine-Chondroitin) .[name].. 1 Tab Two Times A Day 13)  Nitrostat 0.4 Mg Subl (Nitroglycerin) .[name].. 1 Tablet Under Tongue At Onset of Chest Pain; You May Repeat Every 5 Minutes For Up To 3 Doses.  Allergies (verified): No Known Drug Allergies  Past History:  Past Medical History: Last updated: 05/11/2008 RHEUMATOID ARTHRITIS (ICD-714.0) HYPERTENSION, UNSPECIFIED (ICD-401.9) HYPERLIPIDEMIA-MIXED (ICD-272.4) CEREBROVASCULAR DISEASE  (ICD-437.9) CAD, UNSPECIFIED SITE (ICD-414.00)    Family History: Last updated: 05/12/2008 Negative FH of Diabetes, Hypertension, or Coronary Artery Disease  Social History: Last updated: 05/12/2008 Retired  Tobacco Use - No.  Alcohol Use - no  Risk Factors: Smoking Status: never (05/12/2008)  Review of Systems       negative other than history of present illness  Vital Signs:  Patient profile:   77 year old female Height:      64 inches Weight:      173 pounds BMI:     29.80 Pulse rate:   64 / minute Pulse rhythm:   regular BP sitting:   136 / 72  (left arm) Cuff size:   large  Vitals Entered By: Julaine Hua, CMA (May 08, 2009 9:57 AM)  Physical Exam  General:  appears younger than stated age Head:  normocephalic and atraumatic Neck:  Neck supple, no JVD. No masses, thyromegaly or abnormal cervical nodes. Chest Lathen Seal:  no deformities or breast masses noted Lungs:  Clear bilaterally to auscultation and percussion. Heart:  regular rate and rhythm, normal S1-S2, no carotid bruits Msk:  Back normal, normal gait. Muscle strength and tone normal. Pulses:  pulses normal in all 4 extremities Extremities:  No clubbing or cyanosis. Neurologic:  Alert and oriented x 3. Skin:  Intact without lesions or rashes. Psych:  Normal affect.   EKG  Procedure date:  05/08/2009  Findings:      normal sinus rhythm, normal EKG  Impression & Recommendations:  Problem # 1:  CAD, NATIVE VESSEL (ICD-414.01) Assessment Unchanged  Her updated medication list for  this problem includes:    Norvasc 5 Mg Tabs (Amlodipine besylate) .[name]... 1 tab once daily    Aggrenox 25-200 Mg Xr12h-cap (Aspirin-dipyridamole) .[name]... 1 tab two times a day    Carvedilol 12.5 Mg Tabs (Carvedilol) .[name]... 1 tab two times a day    Nitrostat 0.4 Mg Subl (Nitroglycerin) .[name]... 1 tablet under tongue at onset of chest pain; you may repeat every 5 minutes for up to 3 doses.  Problem # 2:  CEREBROVASCULAR DISEASE  (ICD-437.9) Assessment: Unchanged I do not hear any carotid bruits suspect she still has nonobstructive disease. Continue secondary prevention and not repeat her ultrasound this year.  Problem # 3:  HYPERTENSION, UNSPECIFIED (ICD-401.9) Assessment: Improved  Her updated medication list for this problem includes:    Norvasc 5 Mg Tabs (Amlodipine besylate) .[name]... 1 tab once daily    Diovan Hct 160-12.5 Mg Tabs (Valsartan-hydrochlorothiazide) .[name]... 1 tab once daily    Carvedilol 12.5 Mg Tabs (Carvedilol) .[name]... 1 tab two times a day  Her updated medication list for this problem includes:    Norvasc 5 Mg Tabs (Amlodipine besylate) .[name]... 1 tab once daily    Diovan Hct 160-12.5 Mg Tabs (Valsartan-hydrochlorothiazide) .[name]... 1 tab once daily    Carvedilol 12.5 Mg Tabs (Carvedilol) .[name]... 1 tab two times a day  Problem # 4:  HYPERLIPIDEMIA-MIXED (ICD-272.4) Assessment: Improved  Her updated medication list for this problem includes:    Crestor 5 Mg Tabs (Rosuvastatin calcium) .[name]... 1 once daily  Her updated medication list for this problem includes:    Crestor 5 Mg Tabs (Rosuvastatin calcium) .[name]... 1 once daily  Patient Instructions: 1)  Your physician recommends that you schedule a follow-up appointment in: [place] 2)  Your physician recommends that you continue on your current medications as directed. Please refer to the Current Medication list given to you today.

## 2010-03-21 NOTE — Progress Notes (Signed)
Summary: questions about medications (dosage)  Phone Note Call from Patient Call back at Home Phone [phone]   Caller: Patient Reason for Call: Talk to Nurse Summary of Call: Patient has several questions regarding medications (dosage) Initial call taken by: Alexander Bergeron,  February 20, 2009 10:07 AM  Follow-up for Phone Call        Dr Carlis Abbott filled the med.  He will request for more tabs the next time [name], RN, BSN  February 20, 2009 10:43 AM

## 2010-05-13 LAB — COMPREHENSIVE METABOLIC PANEL
ALT: 20 U/L (ref 0–35)
AST: 22 U/L (ref 0–37)
Alkaline Phosphatase: 66 U/L (ref 39–117)
CO2: 25 mEq/L (ref 19–32)
Chloride: 105 mEq/L (ref 96–112)
Creatinine, Ser: 1.15 mg/dL (ref 0.4–1.2)
GFR calc Af Amer: 55 mL/min — ABNORMAL LOW (ref >60)
GFR calc non Af Amer: 46 mL/min — ABNORMAL LOW (ref >60)
Potassium: 4.1 mEq/L (ref 3.5–5.1)
Sodium: 134 mEq/L — ABNORMAL LOW (ref 135–145)
Total Bilirubin: 0.3 mg/dL (ref 0.3–1.2)

## 2010-05-13 LAB — BASIC METABOLIC PANEL
BUN: 17 mg/dL (ref 6–23)
CO2: 23 mEq/L (ref 19–32)
Calcium: 8.7 mg/dL (ref 8.4–10.5)
Creatinine, Ser: 1.09 mg/dL (ref 0.4–1.2)
GFR calc Af Amer: 59 mL/min — ABNORMAL LOW (ref >60)

## 2010-05-13 LAB — GLUCOSE, CAPILLARY
Glucose-Capillary: 113 mg/dL — ABNORMAL HIGH (ref 70–99)
Glucose-Capillary: 132 mg/dL — ABNORMAL HIGH (ref 70–99)
Glucose-Capillary: 235 mg/dL — ABNORMAL HIGH (ref 70–99)
Glucose-Capillary: 98 mg/dL (ref 70–99)
Glucose-Capillary: 98 mg/dL (ref 70–99)

## 2010-05-13 LAB — URINALYSIS, ROUTINE W REFLEX MICROSCOPIC
Glucose, UA: NEGATIVE mg/dL
Hgb urine dipstick: NEGATIVE
Ketones, ur: NEGATIVE mg/dL
Protein, ur: NEGATIVE mg/dL

## 2010-05-13 LAB — DIFFERENTIAL
Basophils Absolute: 0 10*3/uL (ref 0.0–0.1)
Basophils Relative: 0 % (ref 0–1)
Eosinophils Absolute: 0.1 10*3/uL (ref 0.0–0.7)
Eosinophils Relative: 2 % (ref 0–5)

## 2010-05-13 LAB — CBC
MCHC: 32.9 g/dL (ref 30.0–36.0)
MCV: 100.6 fL — ABNORMAL HIGH (ref 78.0–100.0)
MCV: 100.7 fL — ABNORMAL HIGH (ref 78.0–100.0)
Platelets: 161 10*3/uL (ref 150–400)
RBC: 3.37 MIL/uL — ABNORMAL LOW (ref 3.87–5.11)
RBC: 3.45 MIL/uL — ABNORMAL LOW (ref 3.87–5.11)
WBC: 3.8 10*3/uL — ABNORMAL LOW (ref 4.0–10.5)
WBC: 4.4 10*3/uL (ref 4.0–10.5)

## 2010-05-13 LAB — URINE CULTURE

## 2010-05-13 LAB — URINE MICROSCOPIC-ADD ON

## 2010-05-30 LAB — BASIC METABOLIC PANEL
BUN: 22 mg/dL (ref 6–23)
BUN: 29 mg/dL — ABNORMAL HIGH (ref 6–23)
Calcium: 8.7 mg/dL (ref 8.4–10.5)
Chloride: 106 mEq/L (ref 96–112)
Chloride: 107 mEq/L (ref 96–112)
Creatinine, Ser: 1.01 mg/dL (ref 0.4–1.2)
GFR calc non Af Amer: 46 mL/min — ABNORMAL LOW (ref >60)
Potassium: 4.4 mEq/L (ref 3.5–5.1)
Sodium: 141 mEq/L (ref 135–145)

## 2010-05-30 LAB — DIFFERENTIAL
Eosinophils Absolute: 0 10*3/uL (ref 0.0–0.7)
Eosinophils Relative: 1 % (ref 0–5)
Lymphocytes Relative: 16 % (ref 12–46)
Lymphs Abs: 0.6 10*3/uL — ABNORMAL LOW (ref 0.7–4.0)
Monocytes Absolute: 0.4 10*3/uL (ref 0.1–1.0)
Monocytes Relative: 11 % (ref 3–12)

## 2010-05-30 LAB — CBC
HCT: 37.1 % (ref 36.0–46.0)
Hemoglobin: 12.6 g/dL (ref 12.0–15.0)
MCHC: 35.2 g/dL (ref 30.0–36.0)
MCV: 98.3 fL (ref 78.0–100.0)
MCV: 98.7 fL (ref 78.0–100.0)
Platelets: 153 10*3/uL (ref 150–400)
Platelets: 169 10*3/uL (ref 150–400)
RBC: 3.76 MIL/uL — ABNORMAL LOW (ref 3.87–5.11)
WBC: 3.3 10*3/uL — ABNORMAL LOW (ref 4.0–10.5)
WBC: 3.7 10*3/uL — ABNORMAL LOW (ref 4.0–10.5)

## 2010-05-30 LAB — GLUCOSE, CAPILLARY
Glucose-Capillary: 191 mg/dL — ABNORMAL HIGH (ref 70–99)
Glucose-Capillary: 71 mg/dL (ref 70–99)

## 2010-05-30 LAB — HEMOGLOBIN A1C
Hgb A1c MFr Bld: 6.1 % (ref 4.6–6.1)
Mean Plasma Glucose: 128 mg/dL

## 2010-05-30 LAB — POCT CARDIAC MARKERS: Troponin i, poc: <0.05 ng/mL (ref 0.00–0.09)

## 2010-06-19 ENCOUNTER — Ambulatory Visit: Payer: Self-pay | Admitting: Cardiology

## 2010-06-28 ENCOUNTER — Encounter: Payer: Self-pay | Admitting: Cardiology

## 2010-07-01 ENCOUNTER — Ambulatory Visit: Payer: Self-pay | Admitting: Cardiology

## 2010-07-01 ENCOUNTER — Ambulatory Visit (INDEPENDENT_AMBULATORY_CARE_PROVIDER_SITE_OTHER): Payer: Medicare Other | Admitting: Cardiology

## 2010-07-01 ENCOUNTER — Encounter: Payer: Self-pay | Admitting: Cardiology

## 2010-07-01 VITALS — BP 150/72 | HR 62 | Resp 18 | Ht 64.0 in | Wt 170.8 lb

## 2010-07-01 DIAGNOSIS — I679 Cerebrovascular disease, unspecified: Secondary | ICD-10-CM

## 2010-07-01 DIAGNOSIS — I10 Essential (primary) hypertension: Secondary | ICD-10-CM

## 2010-07-01 DIAGNOSIS — Z9889 Other specified postprocedural states: Secondary | ICD-10-CM

## 2010-07-01 DIAGNOSIS — Z951 Presence of aortocoronary bypass graft: Secondary | ICD-10-CM | POA: Insufficient documentation

## 2010-07-01 DIAGNOSIS — I2510 Atherosclerotic heart disease of native coronary artery without angina pectoris: Secondary | ICD-10-CM

## 2010-07-01 DIAGNOSIS — I2119 ST elevation (STEMI) myocardial infarction involving other coronary artery of inferior wall: Secondary | ICD-10-CM

## 2010-07-01 NOTE — Assessment & Plan Note (Signed)
Doing well, no change in treatment.

## 2010-07-01 NOTE — Assessment & Plan Note (Signed)
Stable

## 2010-07-01 NOTE — Patient Instructions (Signed)
Your physician recommends that you schedule a follow-up appointment in: 1 year with Dr. Verl Blalock

## 2010-07-01 NOTE — Assessment & Plan Note (Signed)
I rechecked her BP....134/70. No change in treatment.

## 2010-07-01 NOTE — Progress Notes (Signed)
   Patient ID: [name], female    DOB: 08/04/1927, 77 y.o.   MRN: [id number]  HPI [name] returns for E and M of her CAD, Hx of CABG and  post op CVA. She is doing well with no angina or chest pain. She denies any sxs of TIA's or recurrent CVA . She remains on Aggrenox. Her blood work is followed by [name].  EKG shows NSR and normal EKG.    Review of Systems  All other systems reviewed and are negative.      Physical Exam  Nursing note and vitals reviewed. Constitutional: She is oriented to person, place, and time. She appears well-developed and well-nourished. No distress.  HENT:  Head: Normocephalic and atraumatic.  Eyes: EOM are normal. Pupils are equal, round, and reactive to light.  Neck: Normal range of motion. No JVD present. No tracheal deviation present. No thyromegaly present.  Cardiovascular: Normal rate, regular rhythm, S1 normal, S2 normal and normal pulses.   No extrasystoles are present. PMI is not displaced.     No systolic murmur is present  Pulmonary/Chest: Effort normal and breath sounds normal.  Abdominal: Soft. Bowel sounds are normal.  Musculoskeletal: She exhibits no edema.       Uses cane, left sided residual weakness.  Neurological: She is alert and oriented to person, place, and time.  Skin: Skin is warm and dry.  Psychiatric: She has a normal mood and affect.

## 2010-07-02 NOTE — H&P (Signed)
[name], [name]             ACCOUNT NO.:  [id number]   MEDICAL RECORD NO.:  [id number]          PATIENT TYPE:  EMS   LOCATION:  [facility]                         FACILITY:  [facility]   PHYSICIAN:  [name], MDDATE OF BIRTH:  1928-01-02   DATE OF ADMISSION:  05/16/2008  DATE OF DISCHARGE:                              HISTORY & PHYSICAL   PRIMARY CARDIOLOGIST:  Shannon Moores C. Verl Blalock, MD, [facility]   PRIMARY CARE PHYSICIAN:  [name], M.D.   REASON FOR ADMISSION:  Chest pain.   HISTORY OF PRESENT ILLNESS:  A 77 year old African American female with  known history of CAD, coronary artery bypass grafting in 2003, CVA while  sitting at a table today at [employer] study, had intense, nonradiating,  severe chest discomfort substernal with associated diaphoresis, no  dyspnea.  The pain lasted approximately 30 minutes.  A friend at the  church did a blood pressure and it was 148/68, heart rate of 68.  Secondary to continuing chest discomfort, her friends brought her to the  emergency room.  The pain lasted approximately 30 minutes and went away  on its own.  It is similar to the prior MI that she had in 2003, but she  states the one in 2003 felt like gas getting stronger.  Today, it was  not as strong and it did not become more intense with time.   REVIEW OF SYSTEMS:  Positive for sweatiness and chest pain, otherwise  all other systems were reviewed and found to be negative.   PAST MEDICAL HISTORY:  1. CAD.  a:  Status post myocardial infarction in 2003.  b:  Status post PCI to the right coronary artery 2003.  c:  Status post coronary artery bypass grafting, LIMA to LAD, sequential  vein graft to the first diagonal and OM1, and SVG to RCA  1. Status post right CVA in 2003.  2. Hypertension.  3. Diabetes.   PAST SURGICAL HISTORY:  Coronary artery bypass grafting, 2003.   SOCIAL HISTORY:  The patient lives in [place].  She is a widow.  She  lives alone.  She does not smoke, does not  drink alcohol.   FAMILY HISTORY:  Mother deceased from colon cancer.  Father deceased  from unknown cause.  The patient states he died suddenly.  She is an  only child.   CURRENT MEDICATIONS:  1. Aggrenox 200/25 daily.  2. Actos 15 mg daily.  3. Amaryl 4 mg daily.  4. Norvasc 5 mg daily.  5. Diovan HCT 160/12.5 daily.  6. Crestor 5 mg daily.  7. Coreg 12.5 mg b.i.d.  8. Cal-Citrate 500 mg daily.   ALLERGIES:  No known drug allergies.   CURRENT LABS:  Sodium 141, potassium 4.4, chloride 106, CO2 29, BUN 29,  creatinine 1.15, glucose 114, hemoglobin 12.6, hematocrit 37.1, white  blood cells 3.7, platelets 169.  Chest x-ray revealing no active  cardiopulmonary disease.  EKG revealing normal sinus rhythm, ventricular  rate of 61 beats per minute.   PHYSICAL EXAMINATION:  VITAL SIGNS:  Blood pressure 161/58, heart rate  65, respirations 18, temperature 97.7, O2 sat  99% on room air.  GENERAL:  She is awake, alert, oriented, in no acute distress.  HEENT:  Head is normocephalic and atraumatic.  Eyes, PERRLA.  Mucous  membranes of mouth pink and moist.  Tongue is midline.  NECK:  Supple.  No JVD.  No carotid bruits appreciated.  CARDIOVASCULAR:  Regular rate and rhythm without murmurs, rubs, or  gallops.  Pulses are 2+ and equal without bruits.  LUNGS:  Clear to auscultation without wheezes, rales, or rhonchi.  ABDOMEN:  Soft, nontender.  Bowel sounds 2+.  EXTREMITIES:  Without clubbing, cyanosis, or edema.  NEURO:  Cranial nerves II through XII are grossly intact.   IMPRESSION:  1. Chest pain worrisome for an unstable angina.  2. History of hypertension.  3. Diabetes.  4. Known history of coronary artery disease with status post three-      vessel coronary artery bypass graft in 2003, stent to the right      coronary artery in 2003, and status post myocardial infarction in      2003.   PLAN:  This 77 year old African American female admitted to the ER with  complaints of chest  pain with known history of CAD and CABG, chest pain  worrisome for unstable angina.  Patient has been seen and examined by  myself and Dr. Glori Houston, discussion with the patient for  possible cath versus stress test, the patient agrees to cardiac  catheterization.  This will be completed today, making further  recommendations throughout hospital course.     Shannon Myron. Purcell Nails, NP      Shannon Bensimhon, MD  Electronically Signed   KML/MEDQ  D:  05/16/2008  T:  05/17/2008  Job:  [id number]   cc:   [name], M.D.

## 2010-07-02 NOTE — Discharge Summary (Signed)
[name], [name]             ACCOUNT NO.:  [id number]   MEDICAL RECORD NO.:  [id number]          PATIENT TYPE:  INP   LOCATION:  2505                         FACILITY:  [facility]   PHYSICIAN:  [name], MDDATE OF BIRTH:  April 02, 1927   DATE OF ADMISSION:  05/16/2008  DATE OF DISCHARGE:  05/17/2008                               DISCHARGE SUMMARY   PRIMARY CARDIOLOGIST:  Marcello Moores C. Verl Blalock, MD, [facility]   PRIMARY CARE PHYSICIAN:  Jeanann Lewandowsky, MD   DISCHARGE DIAGNOSIS:  Coronary artery disease (triple-vessel coronary  artery disease, status post 4-vessel coronary artery bypass graft with  4/4 patent bypass grafts.  Normal left ventricular systolic function).   SECONDARY DIAGNOSES:  1. Hypertension.  2. Status post cerebrovascular accident in 2003.  3. Diabetes mellitus.   ALLERGIES:  NKDA.   PROCEDURES PERFORMED DURING THIS HOSPITALIZATION:  1. The patient had an EKG on May 16, 2008.  This shows sinus rhythm      with no acute ST-T wave changes, no significant Q-waves, normal      axis, no evidence of hypertrophy, intervals within normal limits,      and no significant change from EKG completed on January 19, 2005.  2. A chest x-ray performed May 16, 2008, that showed no active      cardiopulmonary disease.  3. Cardiac catheterization performed on May 16, 2008, that showed:      a.     Triple-vessel coronary artery disease, S/P 4-vessel CABG       with 4/4 patent bypass grafts.      b.     Normal left ventricular systolic function.   HISTORY OF PRESENT ILLNESS:  The patient is a 77 year old African  American female with a known history of CAD (S/P CABG), S/P CVA,  presenting with intense, nonradiating, severe chest discomfort in  substernal area with associated diaphoresis and no dyspnea that came on  today while sitting at [employer] study.  The pain lasted approximately 30  minutes.  A friend checked her blood pressure and it was 148/68 and  heart rate 68.  Secondary  to continuing chest discomfort, her friends  brought her to the ED.  The pain resolved on its own after 30 minutes.  The patient reports symptoms similar to prior MI in 2003, but she stated  that one in 2003 was much worse in severity.  Today, symptoms were not  nearly as severe and resolved completely after 30 minutes.   HOSPITAL COURSE:  The patient admitted and underwent procedures as  described above.  The patient tolerated them well without any  significant complications.  Per Dr. Camillia Herter recommendations, the  patient will be managed medically at this time.  She will have a follow  up appointment in 1 week for a stress Myoview and carotid artery  Dopplers and 1 week following that she will see Ermalinda Barrios at [facility]  Cardiology as Dr. Verl Blalock is unavailable at that time.  The patient will  receive her followup instructions as well as medication list and post  cath instructions in both oral and written form.  At the time  of  discharge, the patient had no questions or concerns that are not  addressed.  Please note the patient's blood glucose ranged from 58-191  without any symptoms of hypoglycemia. Vital signs remained stable during  hospital course with mild stable asymptomatic sinus bradycardia with  heart rate as low as 47 bpm.  Note, the patient reports 100% compliance  with medications at home and no symptoms.  Therefore, the patient will  continue on all of her home medications outpatient her beta-blocker.  Most recent vital signs on date of discharge; temperature 97.8 degrees  Fahrenheit, BP 117/48, pulse 66, respiratory rate 16, O2 saturation 98%  on room air, and weight 78.3 kg.   DISCHARGE LABORATORY DATA:  WBC 3.3, HGB 11.7, HCT 33.2, and PLT count  153.  Protime 14.2 and INR 1.1.  Sodium 139, potassium 4.2, chloride  107, CO2 of 29, BUN 22, creatinine 1.01, glucose 58.  Calcium is 8.7.  The patient had 1 set of point-of-care markers drawn with CK-MB 5.4,  troponin-I  less than 0.05, and myoglobin 341.   FOLLOWUP PLANS AND APPOINTMENTS:  1. The patient has an appointment for a stress test and carotid      Dopplers on May 25, 2008, at 12:30 p.m.  2. The patient has a followup appointment with Estella Husk at      [facility] on May 31, 2008, at 1:30 p.m.   DISCHARGE MEDICATIONS:  1. Amaryl 4 mg p.o. daily.  2. Norvasc 5 mg p.o. daily.  3. Crestor 2.5 mg p.o. daily  (one-half 5 mg tab).  4. Coreg 12.5 mg p.o. b.i.d.  5. Calcitrate as previously prescribed.  6. Actos 15 mg p.o. daily.  7. Aggrenox 200/25 mg p.o. b.i.d.  8. Diovan 160 mg p.o. daily.  9. Hydrochlorothiazide 12.5 mg p.o. daily.  10.Nitroglycerin 0.4 mg sublingual p.r.n. for chest pain.   Duration of discharge encounter including physician time was 45 minutes.      Guss Bunde, [facility]      Shannon Chandler, MD  Electronically Signed    MS/MEDQ  D:  05/17/2008  T:  05/17/2008  Job:  [id number]   cc:   Thomas C. Verl Blalock, MD, [facility]  Jeanann Lewandowsky, M.D.  Ermalinda Barrios, PA-C

## 2010-07-02 NOTE — Cardiovascular Report (Signed)
[name], [name]             ACCOUNT NO.:  [id number]   MEDICAL RECORD NO.:  [id number]          PATIENT TYPE:  INP   LOCATION:  2505                         FACILITY:  [facility]   PHYSICIAN:  Lauree Chandler, MDDATE OF BIRTH:  10/23/1927   DATE OF PROCEDURE:  05/16/2008  DATE OF DISCHARGE:                            CARDIAC CATHETERIZATION   PRIMARY CARDIOLOGIST:  Marcello Moores C. Wall, MD, [facility]   PROCEDURES PERFORMED:  1. Left heart catheterization.  2. Selective coronary angiography.  3. Left internal mammary artery bypass graft angiography.  4. Saphenous vein graft angiography.  5. Left ventricular angiogram.   OPERATOR:  Lauree Chandler, MD   INDICATIONS:  Chest pain in a patient with prior bypass surgery.   DETAILS OF PROCEDURE:  The patient was brought to the Inpatient Cardiac  Catheterization Laboratory after signing informed consent for the  procedure.  The right groin was prepped and draped in the sterile  fashion.  Lidocaine 1% was used for local anesthesia.  A 5-French sheath  was inserted into the right femoral artery without difficulty.  A JL4  diagnostic catheter was used to perform the left coronary angiography.  A Williams right catheter was used to selectively engage the right  coronary artery and perform selective angiography.  The JR4 catheter was  used to selectively engage and inject the saphenous vein grafts.  The  JR4 catheter was also used to selectively engage and inject the left  internal mammary artery bypass graft.  A 5-French pigtail catheter was  used to perform the left ventricular angiography.  The patient tolerated  the procedure well.  There was no significant gradient noted across the  aortic valve.  She was taken to the holding area in stable condition.   HEMODYNAMIC FINDINGS:  Central aortic pressure 155/64, left ventricular  pressure [id number], end-diastolic pressure 13.   ANGIOGRAPHIC FINDINGS:  1. The left main coronary artery has a  distal 20% stenosis.  2. The left anterior descending artery has a long segment in the      proximal portion that represents a 60-70% stenosis.  This begins      just prior to the septal perforator and extends distally beyond the      septal perforator into the midportion of the vessel.  There is a      99% functional occlusion in the midportion of the vessel with      competitive flow noted from the left internal mammary artery graft.      The mid and distal LAD fills from the left internal mammary artery      bypass graft.  The first diagonal is totally occluded.  The first      diagonal fills from the saphenous vein graft.  3. The circumflex artery has serial 30% lesions in the proximal and      mid vessel.  The first obtuse marginal is totally occluded at the      ostium and fills from the saphenous vein graft.  The second and      third obtuse marginals are small in caliber.  There is a large  posterolateral branch that is moderate sized and does not have      significant obstruction.  4. The right coronary artery has a long segment of 50% stenosis in the      proximal portion.  There appears to be a prior stent in the      proximal portion with 50% in-stent restenosis.  The vessel quickly      tapers down to a 90% long area of stenosis in the midportion.      There is competitive filling in the midportion of the vessel from      the saphenous vein graft.  The mid right coronary artery, distal      right coronary artery, and posterior descending artery fills from      the saphenous vein graft.  5. The saphenous vein graft to the distal right coronary artery is      patent.  6. The saphenous vein graft to the first obtuse marginal and first      diagonal (sequential) is patent.  7. The left internal mammary artery graft to the mid LAD is patent.      There is diffuse disease noted in the distal LAD beyond the graft      insertion site.  8. Left ventricular angiogram was  performed in the RAO projection and      shows normal systolic function with no wall motion abnormalities.      Ejection fraction is estimated at 65-70%.   IMPRESSION:  1. Triple-vessel coronary artery disease, status post four-vessel      coronary artery bypass graft with 4/4 patent bypass grafts.  2. Normal left ventricular systolic function.   RECOMMENDATIONS:  I recommend continued medical management in this  patient.  The patient will be monitored overnight on the telemetry unit.  No medication changes will be made at this time.      Lauree Chandler, MD  Electronically Signed     CM/MEDQ  D:  05/16/2008  T:  05/17/2008  Job:  [id number]   cc:   Thomas C. Wall, MD, [facility]

## 2010-07-04 ENCOUNTER — Other Ambulatory Visit: Payer: Self-pay | Admitting: *Deleted

## 2010-07-04 MED ORDER — NITROGLYCERIN 0.4 MG SL SUBL: 0.4000 mg | SUBLINGUAL_TABLET | SUBLINGUAL | Status: DC | PRN

## 2010-07-05 NOTE — Discharge Summary (Signed)
[place]  Patient:    [name], [name] Visit Number: [id number] MRN: [id number]          Service Type: MED Location: 2000 2024 01 Attending Physician:  Valla Leaver Dictated by:   Earnstine Regal, P.A. Admit Date:  04/07/2001 Discharge Date: 04/10/2001   CC:         Thomas C. Wall, M.D. [facility]   Discharge Summary  CONTINUATION  DATE OF BIRTH:  [birth year]  She was found to have a CVA.  She has a significant right hemiparesis with no function on the right side and significant aphasia for the next several days. Initial CT scan was negative, but the following CT showed small vessel disease and new areas of ischemia in the left frontal, right ventricular regions.  She was unable to swallow.  A tube feeding was started.  Tube feeding was placed by x-ray using fluoroscopy.  She remained stable.  Repeat Dopplers of the carotid showed right noncalcific plaque throughout and moderate left calcific plaque in the bifurcation.  No ICA stenosis and antegrade vertebral artery flow was present bilaterally.  She was ultimately transferred back to the floor and started on phase I cardiac rehab.  She has also had PT, OT, and speech therapy working with her.  Her diet has been slowly advanced.  She has recently had a modified barium swallow and showed continued improvement in her p.o. intake.  From a surgical standpoint, she has done quite well and her wounds are healing nicely.  Chest is clear.  Mentally, she is neurologically intact.  Her speech is clear, she is swallowing, and is on a dysphagia diet and has done well with that.  She was ultimately seen by rehab service and they agreed she is a good candidate for transfer to inpatient rehab.  She is very willing to do whatever she is asked to do.  She is now walking with some difficulty.  Her right hand is now functional although she does not have a great deal of strength in it, but at least she can  move it and is proceeding with her rehab therapy.  Her medicines have been markedly reduced.  She has been started back on DiaBeta for her glucose control and she has come off tube feedings, her sugars have come back into line.  Although her p.o. intake needs to improve, she is doing well and calorie counts on March 11 at 1610 showed she had taken 80 to 90% of her house diet and, by March 12, she has continued to show good progress.  Her current medications include aspirin 325 mg q.d., DiaBeta 2.5 mg p.o. b.i.d.  This was supposed to be increased to 5 mg in the a.m. and 2.5 in the p.m.  She is still on the sliding scale Humalog and she is also on Plavix.  From our standpoint, she looks good and incisions are healing well.  She has been seen in consultation by the rehab service and they are in agreement with her going to rehab.  She has excellent motivation and wants to go home when she is ready.  We plan to transfer her to rehab as soon as a bed is available on March 13 or April 30, 2001.  DISCHARGE MEDICATIONS: 1. Aspirin 325 mg q.d. 2. Plavix 75 mg q.d. 3. DiaBeta 5 mg in the a.m. and 2.5 mg in the p.m. 4. Tylox or Darvocet p.r.n. for pain.  We will follow the patient in  subacute care or in the rehab, and ______ appointment, she is to follow up with Dr. Cyndia Bent when she is ready to go home.  DISCHARGE LABS:  As of April 26, 2001, white count is 10.3, hemoglobin is 11.1, and hematocrit is 32.5.  Platelets are 434,000.  Electrolytes are normal.  BUN is 19, creatinine is 0.8.  CONDITION ON DISCHARGE:  Improving. Dictated by:   Earnstine Regal, P.A. Attending Physician:  Valla Leaver DD:  04/28/01 TD:  04/28/01 Job: [id number]

## 2010-07-05 NOTE — Op Note (Signed)
[place]  Patient:    [name], [name] Visit Number: [id number] MRN: [id number]          Service Type: MED Location: [id number] Attending Physician:  Garth Bigness Dictated by:   Gaye Pollack, M.D. Proc. Date: 04/15/01 Admit Date:  04/07/2001 Discharge Date: 04/10/2001   CC:         Thomas C. Wall, M.D. [facility]  Cardiac Cath Lab, [facility]   Operative Report  PREOPERATIVE DIAGNOSES:  Severe 3-vessel coronary artery disease, status post acute angioplasty of the right coronary artery after an acute myocardial infarction.  POSTOPERATIVE DIAGNOSES:  Severe 3-vessel coronary artery disease, status post acute angioplasty of the right coronary artery after an acute myocardial infarction.  PROCEDURES:  Median sternotomy, extracorporeal circulation, coronary artery bypass graft surgery x 4 using left internal mammary artery graft to left anterior descending coronary artery, with a sequential saphenous vein graft to the first diagonal branch of the left anterior descending, and the first obtuse marginal branch of the left circumflex coronary artery, and a saphenous vein graft to the right coronary artery; with Endovein harvest from the right thigh.  SURGEON:  Gaye Pollack, M.D.  ASSISTANT:  Ricki Miller, P.A.C.  ANESTHESIA:  General endotracheal.  CLINICAL HISTORY:  This patient is a 77 year old woman, who was admitted with an acute myocardial infarction secondary to occlusion of the right coronary artery.  She underwent acute angioplasty of the right coronary artery with a successful result.  In addition, she also had a residual 80-90% proximal to mid LAD stenosis.  There was about 90% ostial stenosis of a small first diagonal.  The left circumflex was codominant with a large first marginal and had 60-70% stenosis.  Left ventricular function was well-preserved.  After review of the angiogram and examination of the patient, it was  felt that coronary artery bypass graft surgery was the best treatment to prevent further ischemia and infarction due to her residual disease.  She had been on Plavix and I felt that it would be best to delay her surgery for approximately 7 days to allow this drug to get out of her system to minimize intraoperative bleeding.  I discussed the operative procedure of coronary artery bypass graft surgery with the patient and her sons, including alternatives, benefits, and risks, including bleeding, possible blood transfusion, infection, stroke, myocardial infarction, and death.  They understood and agreed to proceed.  OPERATIVE PROCEDURE:  The patient was taken to the operating room, placed on the table in supine position.  After induction of general endotracheal anesthesia, a Foley catheter was placed in bladder using sterile technique. Then, the chest, abdomen and both lower extremities were prepped and draped in usual sterile manner.  The chest was entered through a median sternotomy incision.  The pericardium opened in the midline.  Examination of the heart showed good ventricular contractility.  The ascending aorta had no palpable plaques in it.  Then, the left internal mammary artery was harvested from the chest wall as a pedicle graft.  This was a medium caliber vessel with excellent blood flow through it.  At the same time, a segment of greater saphenous vein was harvested from the right thigh using the Endovein harvest technique.  This vein was of medium caliber and good quality.  Then, the patient was heparinized and when an adequate activated clotting time was achieved, the distal ascending aorta was cannulated using a 20-French aortic cannula for arterial inflow.  Venous outflow  was achieved using a 2-stage venous cannula through the right atrial appendage.  An antegrade cardioplegia and vent cannula was inserted in the aortic root.  The patient was placed on cardiopulmonary  bypass and the distal coronaries identified.  The LAD was intramyocardial along its entire extent.  It was located in the mid portion and was free of disease here.  The first diagonal was small, but felt to be graftable.  The first marginal was also intramyocardial and was a large graftable vessel.  The right coronary artery was diffusely diseased in its proximal and mid portions, but distally had no visible disease in it.  Then, the aorta was cross-clamped and 500 cc of cold-blood antegrade cardioplegia was administered in the aortic root with quick arrest of the heart.  Systemic hypothermia to 20 degrees Centigrade and topical hypothermia with iced saline was used.  A temperature probe was placed in the septum and insulating pad in the pericardium.  The first distal anastomosis was performed to the diagonal branch.  The internal diameter was about 1.5 mm.  The conduit used was a segment of greater saphenous vein and anastomosis performed in a sequential side-to-side manner using continuous 7-0 Prolene suture.  Flow was measured through the graft and was excellent.  The second distal anastomosis was performed to the first marginal branch.  The internal diameter was about 2.5 mm.  The conduit used was the same segment of greater saphenous vein and the anastomosis performed in a sequential end-to-side manner using continuous 7-0 Prolene suture.  Flow was measured through the graft and was excellent.  Then a dose of cardioplegia was given down the vein graft and in the aortic root.  The third distal anastomosis was performed to the distal right coronary artery.  The internal diameter was greater than 2.5 mm.  The conduit used was a second segment of greater saphenous vein and the anastomosis performed in an end-to-side manner using continuous 7-0 Prolene suture.  Flow was measured through the graft and was excellent. Then another dose of cardioplegia was  administered through the vein  grafts and in the aortic root.  The fourth distal anastomosis was performed to the mid portion of the left anterior descending coronary artery.  The internal diameter of was about 2 mm. The conduit used was the left internal mammary graft and this was brought through an opening in the left pericardium anterior to the phrenic nerve.  It was anastomosed to the LAD in an end-to-side manner using continuous 8-0 Prolene suture.  The pedicle was tacked to epicardium with 6-0 Prolene sutures.  The patient was rewarmed to 37 degrees Centigrade and the clamp removed from the mammary pedicle.  There was rapid warming of the ventricular septum and return of spontaneous ventricular fibrillation.  The cross-clamp was removed with a time of 52 minutes and the patient defibrillated in sinus rhythm.  A partial occlusion clamp was placed on the aortic root and the two proximal vein graft anastomoses were performed in an end-to-side manner using continuous 6-0 Prolene suture.  The clamps were removed, and the vein grafts deaired, and the clamps removed from them.  The proximal and distal anastomoses appeared hemostatic and the lying of the grafts satisfactory. Graft markers were placed around the proximal anastomoses.  Two temporary right ventricular and right atrial pacing wires were placed and brought out through the skin.  When the patient had rewarmed to 37 degrees Centigrade, she was weaned from cardiopulmonary bypass on no inotropic agents.  Total bypass time was 93 minutes.  Cardiac function appeared excellent with a cardiac output of 4 L/min.  Protamine was given and the venous and aortic cannulae were removed without difficulty.  Hemostasis was achieved.  Three chest tubes were placed with a tube in the posterior pericardium, one in the left pleural space and one in the anterior mediastinum.  The pericardium was reapproximated over the heart.  The sternum was closed with #6 stainless steel  wires.  Fascia was closed with continuous #1 Vicryl suture.  Subcutaneous tissue was closed using continuous 2-0 Vicryl and the skin with 3-0 Vicryl subcuticular closure. Lower extremity vein harvest site was closed layers in a similar manner.  The sponge, needle and instrument counts were correct according to scrub nurse. Dry sterile dressings were applied over the incisions, around the chest tubes, which were hooked to Pleur-evac suction.  The patient remained hemodynamically stable and was transported to the surgical intensive care unit in guarded, but stable condition. Dictated by:   Gaye Pollack, M.D. Attending Physician:  Garth Bigness DD:  04/15/01 TD:  04/15/01 Job: [id number]

## 2010-07-05 NOTE — Discharge Summary (Signed)
[name], [name]                         ACCOUNT NO.:  [id number]   MEDICAL RECORD NO.:  [id number]                   PATIENT TYPE:  INP   LOCATION:  3037                                 FACILITY:  [facility]   PHYSICIAN:  Jill Alexanders, M.D.               DATE OF BIRTH:  09/01/1927   DATE OF ADMISSION:  10/07/2001  DATE OF DISCHARGE:  10/08/2001                                 DISCHARGE SUMMARY   ADMISSION DIAGNOSES:  1. Onset of left hemisensory deficit, rule out stroke.  2. Hypertension.  3. Diabetes.  4. Coronary artery disease.   DISCHARGE DIAGNOSES:  1. New onset right anterior cerebral artery distribution cerebral     infarction.  2. Diabetes.  3. Hypertension.  4. Coronary artery disease.   PROCEDURES:  1. MRI of the brain.  2. MR angiography.  3. Carotid Doppler study.  4. 2-D echocardiogram.   COMPLICATIONS:  None.   HISTORY OF PRESENT ILLNESS:  The patient is a 77 year old black female born  [birth year] with a history of hypertension, diabetes, and coronary artery  disease who presents with new onset of left-sided numbness of the arm and  leg that began about three days prior to admission.  The patient notes some  sensation of gait instability, began using a cane to walk.  The patient had  some slurring of speech.  The patient was seen in the emergency room  previously, a CT was negative and was discharged home.  The patient was seen  by Dr. Doy Mince prior to admission and brought into the hospital for  evaluation.   PAST MEDICAL HISTORY:  1. History of hypertension.  2. History of diabetes.  3. History of coronary artery disease.  4. Prior left brain stroke.  5. New onset of right brain anterior cerebral artery distribution cerebral     infarction.  6. History of CABG procedure in the past.  7. History of osteoarthritis.   MEDICATIONS PRIOR TO ADMISSION:  Multivitamins 1 a day, baby aspirin a day,  Coreg 12.5 mg a day, Actos 15 mg a day, Lipitor 20  mg a day for  hypercholesterolemia, Plavix 75 mg a day, Amaryl 4 mg a day, Diovan and  hydrochlorothiazide 80/12.5 mg daily.   ALLERGIES:  The patient has no known allergies.   Please refer to history and physical for the patient's family history,  social history, review of systems and physical examination.   LABORATORY VALUES:  Notable for hemoglobin 12.7, hematocrit 37.5, white  count 4.8, platelets of 234.  Sodium 142, potassium 3.7, chloride 101, CO2  30, BUN 20, creatinine 0.8, glucose 156.  INR 1.1.  SGOT 24, SGPT 27, alk  phos 66, calcium 9.4.  Homocystine level is pending.   HOSPITAL COURSE:  This patient has done well during the course of the  hospitalization.  The patient is admitted for evaluation of left-sided  numbness.  The patient underwent  an MRI scan of the brain showing evidence  of a right anterior cerebral artery distribution stroke, some  atherosclerotic changes noted at the origin of the anterior cerebral  arteries by MR angiogram.  Carotid Doppler study was performed and reveals  no internal carotid artery stenosis on the right, 46% internal carotid  artery stenosis on the left, in the lower end probably with a 2-D  echocardiogram that was performed showing evidence of an ejection fraction  in the normal range of 55-65%.  No left ventricular region wall motion  abnormality is noted.  Aortic valve was slightly calcified.  Left atrial  size is upper limits of normal.  Estimated right ventricular peak systolic  pressure was within normal limits.   This patient has been seen by physical and occupation therapy.  She is  ambulating at cane at this point.  The patient will be discharged to home.  She will be taken off aspirin and Plavix and has been placed on Aggrenox  taking one tablet a day for a week and then go up to one twice a day,  multivitamins daily, Coreg 12.5 mg daily, Actos 15 mg daily, Lipitor 20 mg  daily, Amaryl 4 mg daily, Diovan 80/12.5 mg daily.  The  patient will have a  low salt, low cholesterol diabetic diet.  [facility] 3-4 weeks following discharge.  At the time of discharge,  the patient is awake, alert, cooperative, has full visual fields, normal  speech, no drift to the upper extremities.  She is able to ambulate well  without assistance.  Romberg negative.  No pronator drift is seen.                                               Jill Alexanders, M.D.    CKW/MEDQ  D:  10/08/2001  T:  10/11/2001  Job:  [id number]

## 2010-07-05 NOTE — H&P (Signed)
[place]  Patient:    [name], [name] Visit Number: [id number] MRN: [id number]          Service Type: Attending:  Marijo Conception. Wall, M.D. [facility] Dictated by:   Marijo Conception. Wall, M.D. [facility]. Date:  04/07/01   CC:         [facility]   History and Physical  CHIEF COMPLAINT:  "Aching in my chest off and on since midnight."  HISTORY OF PRESENT ILLNESS:  [name] is a 77 year old African-American female with no previous history of coronary disease, who developed aching in her chest around midnight.  She thought it was from her Glucophage.  This morning it got worse around 7 a.m., and she came to the emergency room by 911.  In the emergency room, EKG shows ST elevation inferiorly, sinus bradycardia. Her blood pressure was about 80 systolic.  No sign of heart block was seen.  She has been treated with intravenous heparin bolus as well as a drip.  She has received aspirin.  She is getting a bolus of fluids as we are taking her to the catheterization lab.  PAST MEDICAL HISTORY:  She has a history of hypertension and type 2 diabetes.  MEDICATIONS:  Glucovance unknown dose q.d., a blood pressure pill which she does not know the name of, one q.d., and arthritis medications.  SOCIAL HISTORY:  She does not smoke or drink.  There is no illicit drug use.  ALLERGIES:  No known drug allergies.  FAMILY HISTORY:  There is no premature family history of coronary disease.  REVIEW OF SYSTEMS:  Other than the above HPI, is unremarkable.  There is no history of bleeding or bleeding diathesis.  PHYSICAL EXAMINATION:  VITAL SIGNS:  Blood pressure of 87/40, pulse is 56 and sinus bradycardia, respiratory rate is 15 and unlabored.  SKIN:  Warm and dry.  GENERAL:  She is in no acute distress.  NECK:  Her carotid upstrokes are equal bilaterally with soft bruits.  There is no JVD.  Thyroid is not enlarged.  Trachea is midline.  CHEST:  Lungs are  clear to auscultation and percussion.  CARDIAC:  Regular rate and rhythm without gallop.  ABDOMEN:  Soft, good bowel sounds.  There was no definite organomegaly.  There were no masses.  Good bowel sounds present.  EXTREMITIES:  Femoral pulses were intact with no bruits.  Distal pulses were also 2+/4+ bilaterally and symmetrical, and equal.  There is no edema.  There are no venous cords.  NEUROLOGIC:  Grossly intact other than the fact that she is somewhat lethargic.  DIAGNOSTIC STUDIES:  Chest x-ray was pending.  ISTAT shows a potassium of 3.5, glucose of 242, creatinine of 1.0.  ASSESSMENT AND PLAN: 1. Acute inferior wall myocardial infarction, probable onset of coronary    occlusion around 7 a.m.  Plan is to take her to an emergency    catheterization with Dr. Vicenta Aly.  Indications, risks, and potential    benefits have been discussed. 2. Type 2 diabetes. 3. Hypokalemia. 4. Hypertension. 5. Bilateral carotid bruits.  Will get bilateral carotid Dopplers. Dictated by:   Marijo Conception Wall, M.D. [place] Attending:  Marijo Conception. Verl Blalock, M.D. [facility] DD:  04/07/01 TD:  04/07/01 Job: [id number]

## 2010-07-05 NOTE — H&P (Signed)
[name], [name]                         ACCOUNT NO.:  [id number]   MEDICAL RECORD NO.:  [id number]                   PATIENT TYPE:  INP   LOCATION:  3037                                 FACILITY:  [facility]   PHYSICIAN:  Legrand Como L. Doy Mince, M.D.           DATE OF BIRTH:  1927-04-27   DATE OF ADMISSION:  10/07/2001  DATE OF DISCHARGE:  10/08/2001                                HISTORY & PHYSICAL   CHIEF COMPLAINT:  Right-side numbness, suspected stroke.   HISTORY OF PRESENT ILLNESS:  This is one of several [facility] admissions for this 77 year old right-handed women with a history of  multiple medical problems including hypertension, diabetes, coronary artery  disease and a left brain stroke suffered in February 2003 with resultant  right hemiparesis and aphasia which is subsequently much improved.  Yesterday, the patient awoke feeling well but at work she developed  relatively sudden onset of numbness and tingling involving the right face,  arm, and legs as well as slurring of speech and difficulty walking.  Her  coworkers also noticed that she was having difficulty walking.  She was seen  in the emergency room yesterday where a CT of the head was performed and was  negative and she was discharged home.  This morning, she called her primary  physician who arranged for an urgent office referral in [facility]  and because of her suspected stroke syndrome she was subsequently admitted.  The patient says that she feels somewhat better today than yesterday but her  symptoms do persist, that she feels unsteady enough since yesterday that she  is ambulating with a cane.  She denies any associated chest pain, shortness  of breath, or headache.   PAST MEDICAL HISTORY:  As above.  She had coronary artery bypass grafting in  February 2003.  Also osteoarthritis.   FAMILY HISTORY:  Family history is remarkable for hypertension; negative for  diabetes or  stroke.   SOCIAL HISTORY:  She lives with her son and is normally independent in her  activities of daily living.  She denies tobacco or alcohol use.   ALLERGIES:  No known drug allergies.   MEDICATIONS:  1. Multivitamin q.d.  2. Baby aspirin 81 mg q.d.  3. Plavix 75 mg q.d.  4. Coreg 12.5 mg q.d.  5. Actos 15 mg q.d.  6. Lipitor 20 mg q.d.  7. Amaryl 4 mg q.d.  8. Diovan HCT 80/12.5 mg q.d.  9. Tylenol p.r.n.   REVIEW OF SYSTEMS:  She has had pain and swelling in her left foot a couple  weeks ago which has gone down now and the etiology of that is uncertain.  Numbness and dysarthria as above as well as mild incoordination and trouble  walking.  She denies headache, loss of consciousness, visual changes,  swallowing problems, weight changes, confusion, urinary or bowel symptoms,  nausea, vomiting, chest pain, shortness of breath, or  skin rash.   PHYSICAL EXAMINATION:  VITAL SIGNS: Blood pressure 170/75, pulse 72,  respirations 16.  GENERAL: She is alert in no evident distress.  HEAD: Cranium is normocephalic and atraumatic.  Oropharynx is benign.  NECK: Supple, without carotid bruits.  CHEST: Clear to auscultation bilaterally.  HEART: Regular rate and rhythm, without murmurs.  ABDOMEN: Soft and benign.  EXTREMITIES: No edema.  NEUROLOGICAL: Mental status - She is awake, alert, and fully oriented.  Speech is mildly dysarthric.  She has no difficulty repeating a phrase or  naming objects.  Recent and remote memory are intact.  Attention span,  concentration, and fund of knowledge are appropriate.  Cranial nerves - Funduscopic exam is benign.  Pupils are equal and briskly  reactive.  Extraocular movements are normal, without nystagmus.  Visual  fields are full to confrontation.  Hearing is intact to finger rub.  Facial  sensation is diminished on the right side to pinprick.  Face, tongue, and  palate move normally and symmetrically.  Shoulder shrug strength is normal.  Motor  testing - Normal bulk and tone, normal strength in all tested  extremity muscles although there is some decreased persistence on the right  distally.  Sensory testing - She reports diminished pinprick and vibratory  sensation on the right side in the upper and lower extremities.  Coordination - Rapid alternating movements are normal.  Finger-to-nose is  performed well.  Gait is normal.  She was able to heel and toe walk, had  moderate difficulty with tandem walking.  Reflexes are 2+ and symmetric  except trace at the ankles.  Toes are downgoing.   IMPRESSION:  Suspected left brain stroke with right hemisensory symptoms.   PLAN:  Brief admission for:   1. MRI brain.  2. Carotid Doppler.  3. Echocardiogram.  4. Labs including hemoglobin A1C, homocysteine and lipid panel.  5. Physical therapy evaluation.   Pending outcome of the above, she may need to be switched to Aggrenox or  maybe further risk factor adjustment.                                               Michael L. Doy Mince, M.D.    MLR/MEDQ  D:  10/07/2001  T:  10/09/2001  Job:  [id number]   cc:   Don Broach. Carlis Abbott, M.D.

## 2010-07-05 NOTE — Discharge Summary (Signed)
[place]  Patient:    [name], [name] Visit Number: [id number] MRN: [id number]          Service Type: [facility] Location: [id number] Attending Physician:  Alger Simons T Dictated by:   Junius Argyle, PA Admit Date:  04/30/2001 Discharge Date: 05/10/2001   CC:         Don Broach. Carlis Abbott, M.D.  Gaye Pollack, M.D.  Orson Eva, M.D.   Discharge Summary  DISCHARGE DIAGNOSES: 1. Left frontal embolic cerebrovascular accident. 2. Status post coronary artery bypass grafting for coronary artery disease. 3. Escherichia coli urinary tract infection. 4. Postoperative anemia. 5. Diabetes mellitus.  HISTORY OF PRESENT ILLNESS:  The patient is a 77 year old right handed female with history of hypertension, diabetes mellitus, admitted to [facility] on April 07, 2001 with chest pain and inferior wall myocardial infarction.  The patient underwent cardiac catheterization with stenting the same day by Dr. Elta Guadeloupe Pulsipher.  The patient was noted to have three vessel disease with high grade left anterior descending stenosis.  She underwent coronary artery bypass grafting x4 by Dr. Cyndia Bent on April 15, 2001.  On April 16, 2001 the patient had lethargy and right sided weakness, decrease in speech.  The CT scan was initially negative.  Dr. Rosiland Oz was consulted and felt the patient probable had acute left brain infarct, middle cerebral artery territory that was thromboembolic.  A 2D echocardiogram done showed no thrombus.  A carotid duplex showed moderate calcific plaque on the left, no internal carotid artery stenosis.  A follow up CT scan of April 17, 2001 showed possibility of a left frontal periventricular white matter ischemia.  The patient did worsen transiently on April 18, 2001, was more alert on April 19, 2001 with CT scan showing progression of small vessel left frontal periventricular white matter disease, no  hemorrhage.  The patient was on aspirin and Plavix for cerebrovascular accident prophylaxis and barium swallow done was borderline normal swallow, patient on D3 thin liquids.  She has had problems with fevers.  She did require two units of packed red blood cells for drop in her hemoglobin and hematocrit to 7.4 and 22.1.   Blood sugars remained elevated with medications being adjusted.  Physical therapy was initiated and the patient continued close supervision to minimal assist for transfers, contact assist for ambulating 200 feet, moderate assist for balance, tends to bump into objects on the right.  Question of moderate receptive aphasia.  PAST MEDICAL HISTORY:  See discharge diagnoses.  Patient also has history of right knee laceration and degenerative joint disease.  ALLERGIES:  No known drug allergies.  SOCIAL HISTORY:  Patient lives with son in two level home with two steps at entry. Patient was independent prior to admission. She does not use any tobacco or alcohol.  Son works during the day.  HOSPITAL COURSE:  The patient was admitted to rehabilitation on April 30, 2001 for inpatient therapies to consist of physical therapy, occupational therapy, speech therapy daily.  As low grade fevers were noted, a urinalysis culture and sensitivity was sent off and urinalysis initially showed 7 to 10 white blood cells with many bacteria.  She was maintained on aspirin and Plavix for cerebrovascular accident prophylaxis and laboratory studies checked past admission showed hemoglobin of 11.5, hematocrit 33.4, white blood cell count 6.7, platelet count 452K.  Sodium was 139, potassium 4.0, chloride 105, Co2 26, BUN 15, creatinine 0.8, glucose 104, total bilirubin 1.7, SGPT 44.  Urine  culture showed Escherichia coli and the patient was started on Amoxicillin and treated with 7 day course of antibiotic therapy.  The patients incision was noted to be healing well without any signs of infection.   She was noted to be tachycardic and low dose beta blocker was added to help assist with this. Blood sugars were monitored q.a.c. and q.h.s. with CBGs and showed good control.  She was started on Medrol pack secondary to complaints of SI joint discomfort in her hips.  Her blood sugars did peak initially secondary to steroids. She was instructed to continue monitoring CBGs past discharge and expect the blood sugars to normalize once the steroid taper is completed.  Past admission speech therapy evaluation showed patient had mild receptive aphasia.  She was able to follow three step commands without difficulty. Basic comprehension and expression were intact.  By the time of discharge verbal expression was noted to be improved with decrease in word finding difficulties.  Speech intelligibility had increased to greater than 90% in conversation level and the patient showed good intellectual awareness of pre, post morbid difficulties.  The patient was limited only by endurance in physical therapy.  She did progress to being modified independent for transfers, modified independent using rolling walker for under 1000 feet on unit, requires close supervision to steady assist on uneven surfaces without assistive device.  She was modified independent for activities of daily living and toileting.  Further follow up therapies to include outpatient physical therapy to begin at [facility] outpatient rehabilitation on May 11, 2001 at 12 noon.  On May 10, 2001 the patient was discharged to home.  DISCHARGE MEDICATIONS: 1. Enteric coated aspirin 325 mg one q.d. 2. Plavix 75 mg one q.d. 3. Glucovance 2.5/500 mg b.i.d.  ACTIVITY:  Intermittent supervision.  DIET:  Diabetic diet, check blood sugars b.i.d. basis.  SPECIAL INSTRUCTIONS:  [facility] outpatient physical therapy to begin May 11, 2001 at 12 noon.   FOLLOW UP:  Patient is to follow up with Dr. Cyndia Bent, Dr. Carlis Abbott in two  weeks, follow up with Dr. Naaman Plummer on June 14, 2001 at 10:00 a.m. Dictated by:   Junius Argyle, PA Attending Physician:  Alger Simons T DD:  06/15/01 TD:  06/16/01 Job: [id number]

## 2010-07-05 NOTE — Discharge Summary (Signed)
[place]  Patient:    [name], [name] Visit Number: [id number] MRN: [id number]          Service Type: [facility] Location: [id number] Attending Physician:  Alger Simons T Dictated by:   Junius Argyle, PA Admit Date:  04/30/2001 Discharge Date: 05/10/2001                             Discharge Summary  NO DICTATION Dictated by:   Junius Argyle, PA Attending Physician:  Alger Simons T DD:  06/15/01 TD:  06/16/01 Job: [id number]

## 2010-07-05 NOTE — Cardiovascular Report (Signed)
[place]  Patient:    [name], [name] Visit Number: [id number] MRN: [id number]          Service Type: Attending:  Allene Dillon, M.D. [facility] Dictated by:   Allene Dillon, M.D. [facility] Date: 04/07/01   CC:         Don Broach. Carlis Abbott, M.D.  Thomas C. Verl Blalock, M.D. [facility] Cardiac Cath Lab   Cardiac Catheterization  PROCEDURE PERFORMED: 1. Left heart catheterization with coronary angiography, left ventriculography    and abdominal aortography. 2. Percutaneous transluminal coronary angioplasty with stent placement in the    proximal right coronary artery. 3. Percutaneous transluminal coronary angioplasty of the mid right coronary    artery.  INDICATION:  Ms. Dacy is a 77 year old woman with a history of hypertension and diabetes mellitus.  She presented to the emergency room with acute onset of substernal chest pain.  EKG was consistent with an acute inferior wall myocardial infarction.  She was brought emergently to the cardiac catheterization laboratory.  CATHETERIZATION PROCEDURAL NOTE:  A 7-French sheath was placed in the right femoral artery, 6-French sheath in the right femoral vein.  Diagnostic catheterization was performed with standard Judkins 6-French catheters. Contrast was Omnipaque.  There were no complications.  RESULTS: Hemodynamics:  Left ventricular pressure 130/30; aortic pressure 130/60. There was no aortic valve gradient.  Left ventriculogram:  There is mild akinesis of the inferior wall.  The anterior wall is hyperkinetic.  Ejection fraction is calculated at 75%.  There is trace mitral regurgitation.  Abdominal aortogram reveals a 30% in the right renal artery.  Left renal artery is patent.  There are minimal luminal irregularities in the abdominal aorta and iliac arteries.  Coronary arteriography (codominant): 1. Left main is normal. 2. Left anterior descending artery has a tubular 80-90% stenosis  extending    from the proximal to mid-vessel.  This area of disease extends across the    origin of a large septal perforator branch and a small-to-normal-sized    first diagonal branch.  The septal perforator itself has an 80% at its    origin and the first diagonal branch has a 90% at its origin.  Further down    in the mid-to-distal LAD is a less than 20% stenosis.  The apical LAD has a    95% stenosis, with the vessel being very small beyond this. 3. Left circumflex is a codominant vessel.  It gives rise to a    small-to-normal-sized ramus intermediate, a large first obtuse marginal    branch, small second obtuse marginal branch, small first and second    posterolateral branches and a normal-sized third posterolateral branch.    The first obtuse marginal branch has a 60-70% stenosis at its origin.  The    distal circumflex has a 30% stenosis across the origins of the first and    second posterolateral branches. 4. Right coronary artery is a relatively small codominant vessel.  There is a    30% stenosis in the proximal vessel followed by 100% occlusion of the    proximal vessel with TIMI-0 flow.  After we established reperfusion in the    right coronary artery, there was demonstrated to be a 90% stenosis in the    mid right coronary artery.  The distal right coronary artery ends as a    small-to-normal-sized bifurcating posterior descending artery.  IMPRESSIONS: 1. Preserved left ventricular systolic function with mild inferior wall    akinesis. 2. Three-vessel  coronary artery disease as described.  The culprit lesion is    an occlusion in the proximal right coronary artery.  PLAN:  Percutaneous intervention to the right coronary artery.  See below.  PERCUTANEOUS TRANSLUMINAL CORONARY ANGIOPLASTY PROCEDURAL NOTE:  Following completion of diagnostic catheterization, we proceeded with percutaneous coronary intervention.  Heparin had been administered prior to arrival in  the catheterization laboratory.  Integrilin was administered per protocol.  We used a 7-French right shepherds crook 3.5 guide with sideholes and a Hi-Torque Floppy wire.  Reperfusion of the vessel was achieved with a floppy wire and this was advanced into the distal vessel.  PTCA of the proximal vessel was then performed with a 2.0 x 15.0-mm Maverick balloon inflated to 8 atmospheres.  We then advanced this balloon further down into the mid-vessel across the 90% stenosis and inflated it to 7 atmospheres.  We pulled this balloon back across the area in the proximal vessel again and inflated it again to 7 atmospheres for three minutes.  Angiographic images at that point revealed improvement in the vessel lumen, however, there was a moderate dissection in the proximal vessel at the site of the original occlusion with moderate residual disease in the mid-vessel.  We therefore proceeded with stent placement in the proximal vessel.  We deployed a 2.0 x 13.0-mm Pixil stent at a deployment pressure of 13 atmospheres.  We then advanced the stent delivery balloon into the area of disease in the mid-vessel and performed three inflations, each to 8 atmospheres.  Final angiographic images revealed patency of the right coronary artery with 0% residual stenosis in the proximal vessel and 20% residual stenosis in the mid-vessel and TIMI-3 flow.  COMPLICATIONS:  None.  RESULTS: 1. Successful percutaneous transluminal coronary angioplasty with stent    placement in the proximal right coronary artery, reducing the    100% occlusion with TIMI-0 flow to 0% residual with TIMI-3 flow. 3. Successful percutaneous transluminal coronary angioplasty of the mid right    coronary artery, reducing a 90% stenosis to 20% residual with TIMI-3 flow.  PLAN:  Integrilin will be continued for 24 hours.  Will review the films  further with my colleagues to determine the best therapeutic option, which includes percutaneous  intervention of the left anterior descending artery versus coronary artery bypass surgery. Dictated by:   Allene Dillon, M.D. [place] Attending:  Allene Dillon, M.D. [facility] DD:  04/07/01 TD:  04/07/01 Job: [id number]

## 2010-07-05 NOTE — Discharge Summary (Signed)
. [facility]  Patient:    [name], [name] Visit Number: [id number] MRN: [id number]          Service Type: MED Location: 2000 2024 01 Attending Physician:  Valla Leaver Dictated by:   Earnstine Regal, P.A. Admit Date:  04/07/2001 Discharge Date: 04/10/2001                             Discharge Summary  DATE OF BIRTH:  09-May-1927  ADMITTING DIAGNOSES: 1. Acute inferior myocardial infarction. 2. Adult onset diabetes mellitus, non-insulin-dependent. 3. Hyperkalemia. 4. Hypertension. 5. Bilateral carotid bruits.  DISCHARGE DIAGNOSES: 1. Acute inferior myocardial infarction with severe three-vessel coronary    artery disease, 80-90% left anterior descending, 95% first diagonal, 100%    right coronary artery, and 60-70% circumflex with normal left ventricular    function. 2. Left brain cerebrovascular accident in the left frontal paraventricular    region with right hemiparesis and aphasia, 40-60% left internal carotid    artery stenosis. 3. Adult onset diabetes mellitus, non-insulin-dependent. 4. Hypertension.  PROCEDURES: 1. Cardiac catheterization, April 07, 2001. 2. Coronary artery bypass grafting x 4 of the left internal mammary to the    LAD, saphenous vein graft to the diagonal and OM sequentially, saphenous    vein graft to right coronary artery April 15, 2001, Dr. Cyndia Bent.  BRIEF HISTORY:  The patient is a 77 year old black female, who was admitted with chest pain.  She had a known history of coronary artery disease.  She was seen in consultation by Dr. Mar Daring.  She was stabilized in the ER, treated with intravenous heparin, and then taken to the cath lab.  PAST MEDICAL HISTORY: 1. Type 2 diabetes. 2. Hyperkalemia. 3. Hypertension. 4. Bilateral carotid bruits.  SOCIAL HISTORY:  She does not drink.  She does not smoke.  MEDICATIONS ON ADMISSION:  Glucovance, a blood pressure pill, and arthritis medication.   She had no names for any of these medications.  For further H&P, see Dr. Yolanda Bonine note.  HOSPITAL COURSE:  The patient was admitted, taken to the cath lab with severe three-vessel coronary artery disease, as noted above.  The patient was then referred to CVTS and Dr. Gaye Pollack.  He agreed that she was a candidate for coronary artery bypass grafting.  With her ______ agents, it was decided to hold off until those had worn off.  She remained hemodynamically stable and did well.  She was subsequently taken to the operating room on April 15, 2001, at which time she underwent coronary artery bypass grafting as described above.  She tolerated the procedure well and came off bypass without difficulty.  Preoperative Doppler studies showed mild calcific plaque at the origin of the ECA and, on the left, there was a mild calcific bifurcation in the origins of the ICA and ECA.  ABIs were normal.  Flow to her upper extremities were also normal.  She came off the bypass without difficulty, tolerated the procedure well, and was transferred to the ICU.  She made good progress over the first 24 hours and was ready for transfer to the floor on the morning of April 16, 2001 to 2000.  She was transferred to 2000 later that afternoon.  Dr. Cyndia Bent was called.  The patient was slow to arouse, weak on the right side.  She was examined by Dr. Cyndia Bent, and it was his impression that she most probably had a  left brain stroke with a right hemiparesis.  A CT scan was obtained.  She was transferred to the SICU for ______ observation and follow-up.  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  It was thought to be thromboembolic in nature.  She was started back on heparin.  For the first several days, she had a profound deficit including aphasia and  significant right-sided hemiparesis.  Her aphasia slowly improved as did her hemiparesis.  Currently Dictated by:   Earnstine Regal, P.A. Attending Physician:  Valla Leaver DD:  04/28/01 TD:  04/28/01 Job: [id number]

## 2011-06-19 ENCOUNTER — Other Ambulatory Visit: Payer: Self-pay | Admitting: Cardiology

## 2011-06-19 DIAGNOSIS — I6529 Occlusion and stenosis of unspecified carotid artery: Secondary | ICD-10-CM

## 2011-06-23 ENCOUNTER — Encounter (INDEPENDENT_AMBULATORY_CARE_PROVIDER_SITE_OTHER): Payer: Medicare Other

## 2011-06-23 DIAGNOSIS — I6529 Occlusion and stenosis of unspecified carotid artery: Secondary | ICD-10-CM

## 2013-02-09 ENCOUNTER — Ambulatory Visit (HOSPITAL_COMMUNITY)
Admission: RE | Admit: 2013-02-09 | Discharge: 2013-02-09 | Disposition: A | Discharge status: Home / Self Care | Payer: Medicare Other | Source: Ambulatory Visit | Attending: Internal Medicine | Admitting: Internal Medicine

## 2013-02-09 ENCOUNTER — Other Ambulatory Visit: Payer: Self-pay | Admitting: Internal Medicine

## 2013-02-09 DIAGNOSIS — M25559 Pain in unspecified hip: Secondary | ICD-10-CM | POA: Insufficient documentation

## 2013-02-09 DIAGNOSIS — R52 Pain, unspecified: Secondary | ICD-10-CM

## 2013-02-09 DIAGNOSIS — Q762 Congenital spondylolisthesis: Secondary | ICD-10-CM | POA: Insufficient documentation

## 2013-02-09 DIAGNOSIS — M25859 Other specified joint disorders, unspecified hip: Secondary | ICD-10-CM | POA: Insufficient documentation

## 2013-02-09 DIAGNOSIS — I700 Atherosclerosis of aorta: Secondary | ICD-10-CM | POA: Insufficient documentation

## 2013-02-09 DIAGNOSIS — M25519 Pain in unspecified shoulder: Secondary | ICD-10-CM | POA: Insufficient documentation

## 2013-02-09 DIAGNOSIS — M1710 Unilateral primary osteoarthritis, unspecified knee: Secondary | ICD-10-CM | POA: Insufficient documentation

## 2013-02-09 IMAGING — CR DG SHOULDER 2+V*R*
3 series · 3 of 3 positions shown · non-contrast
Comparison: None.

CLINICAL DATA: Pain

EXAM:
RIGHT SHOULDER - 2+ VIEW

[view not recorded (1 of 3)]
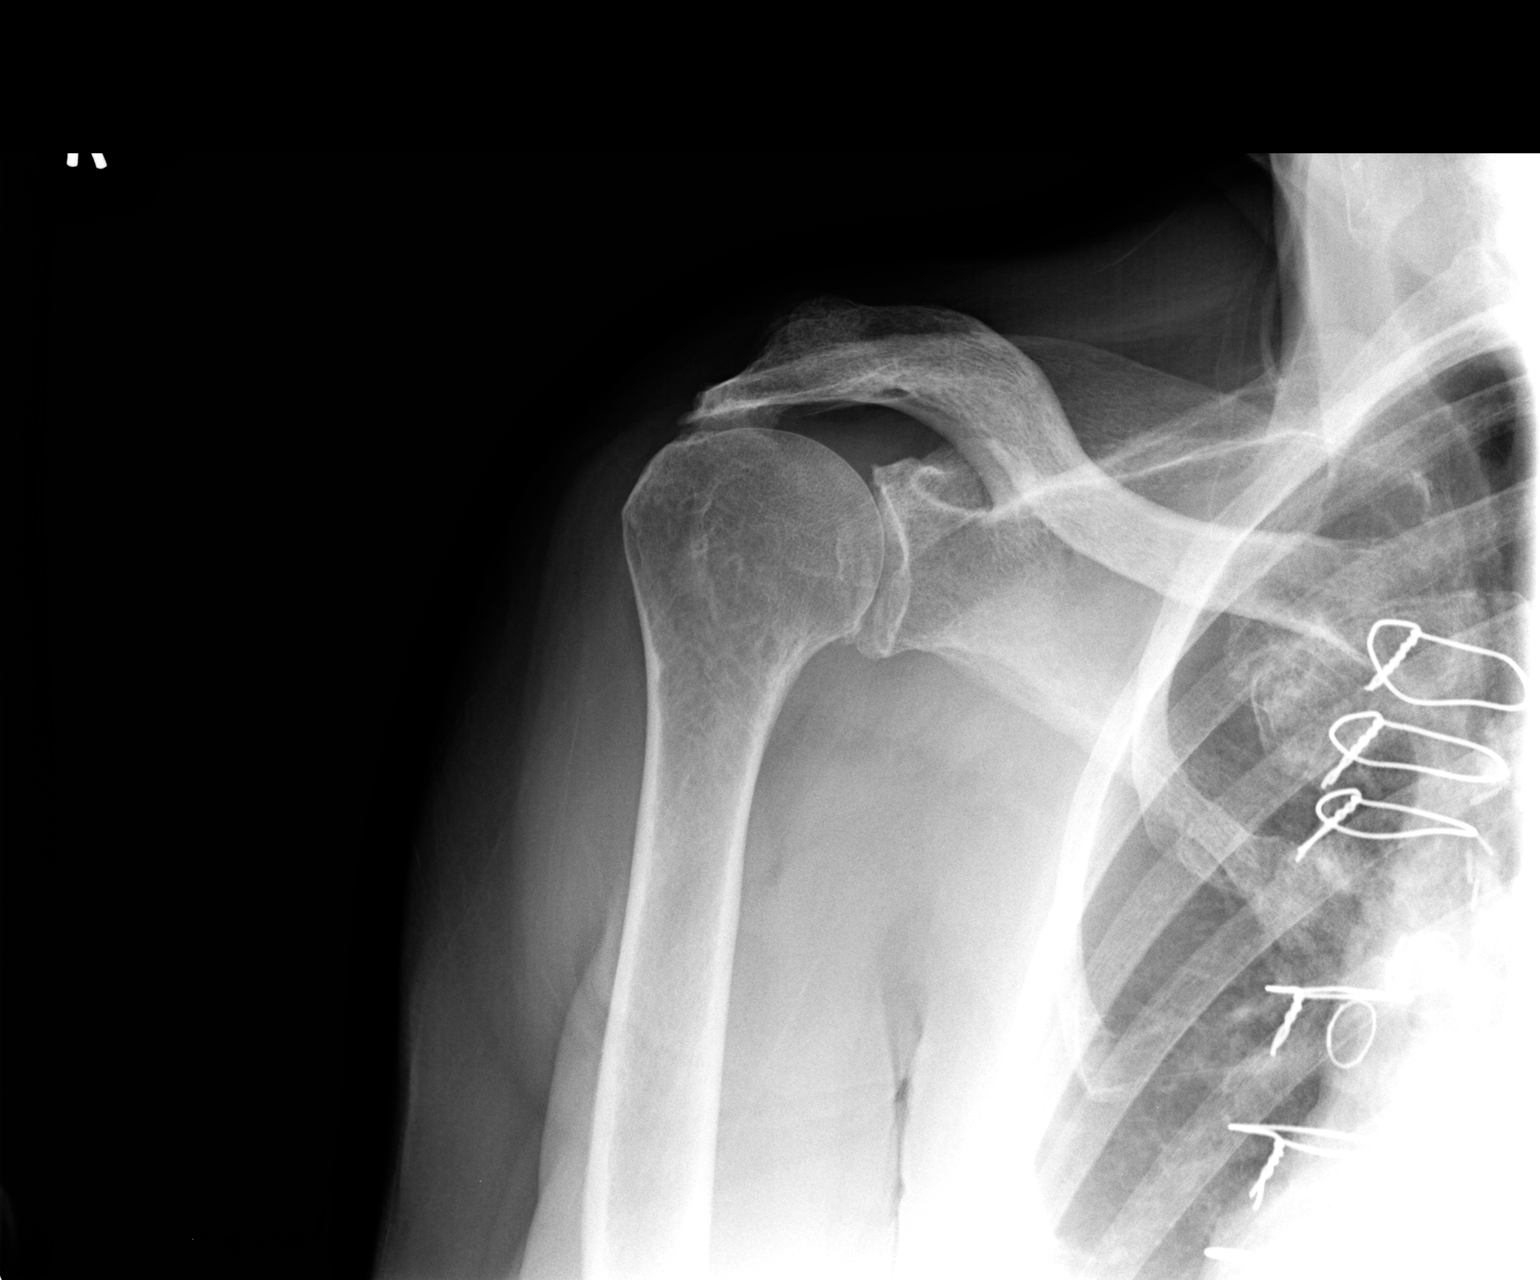

[view not recorded (2 of 3)]
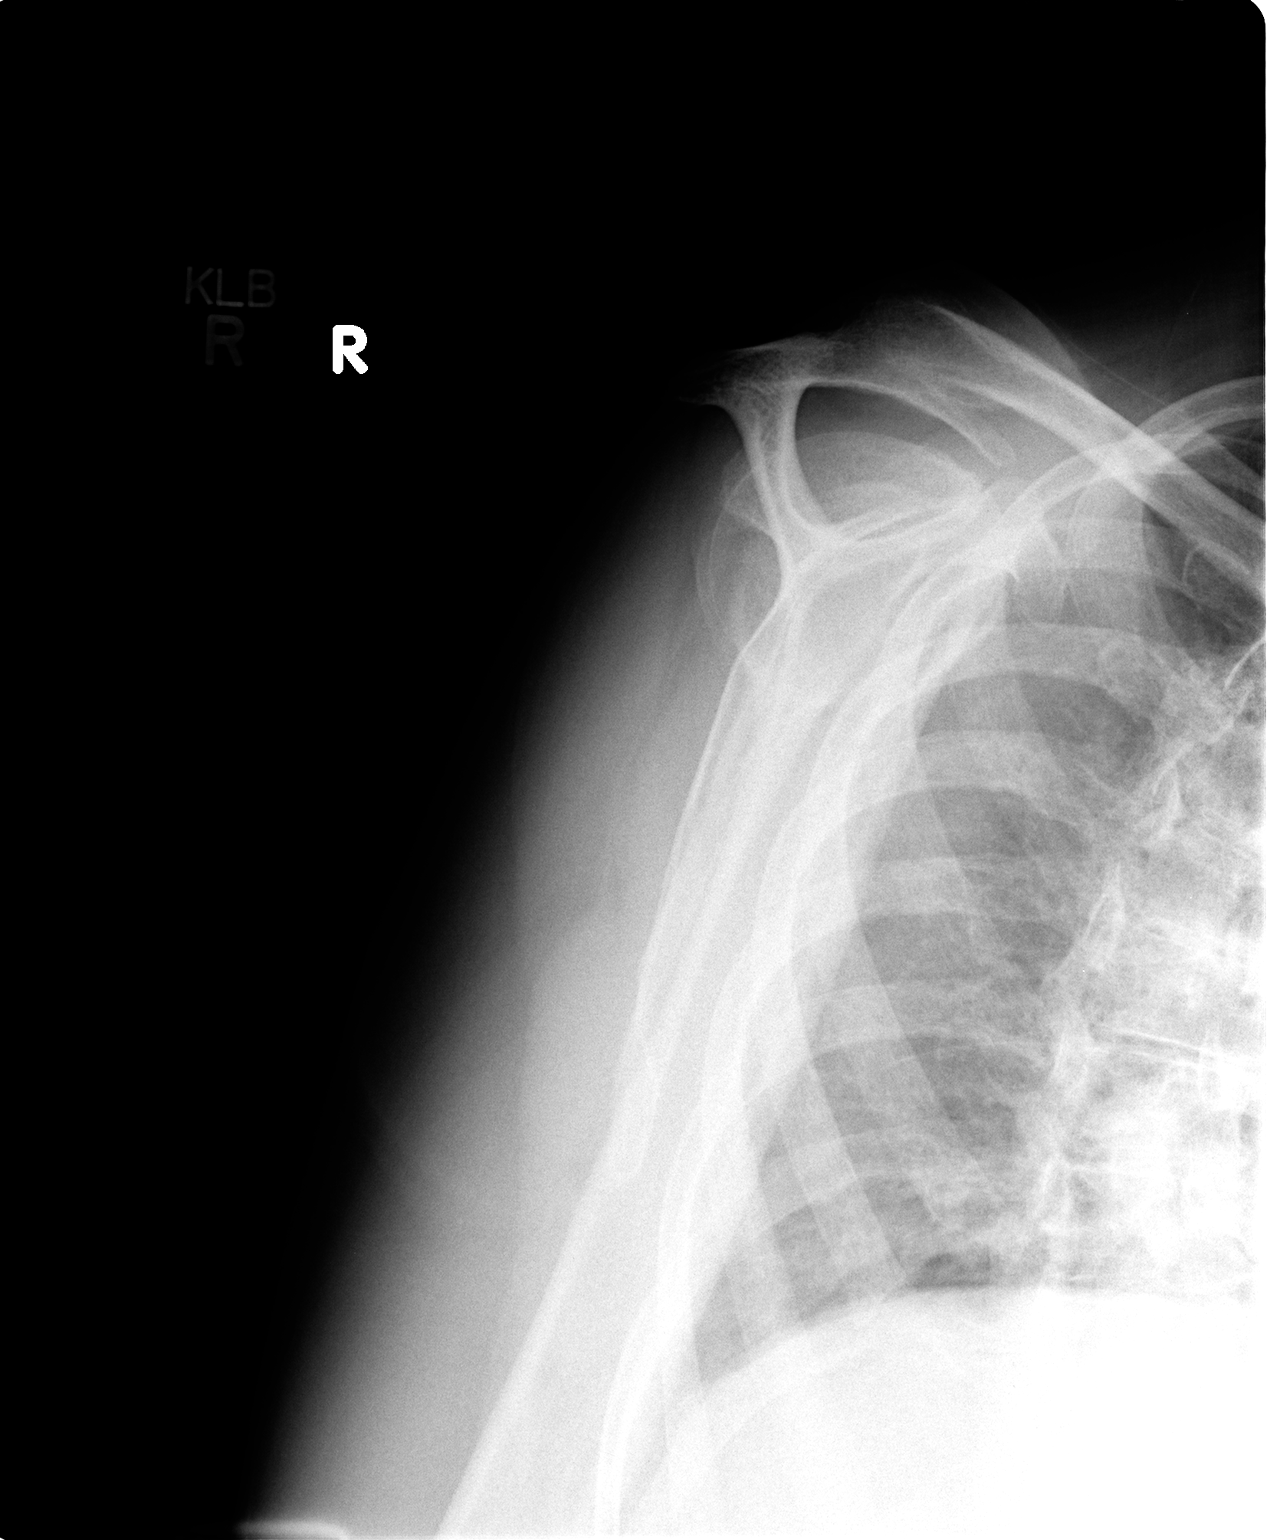

[view not recorded (3 of 3)]
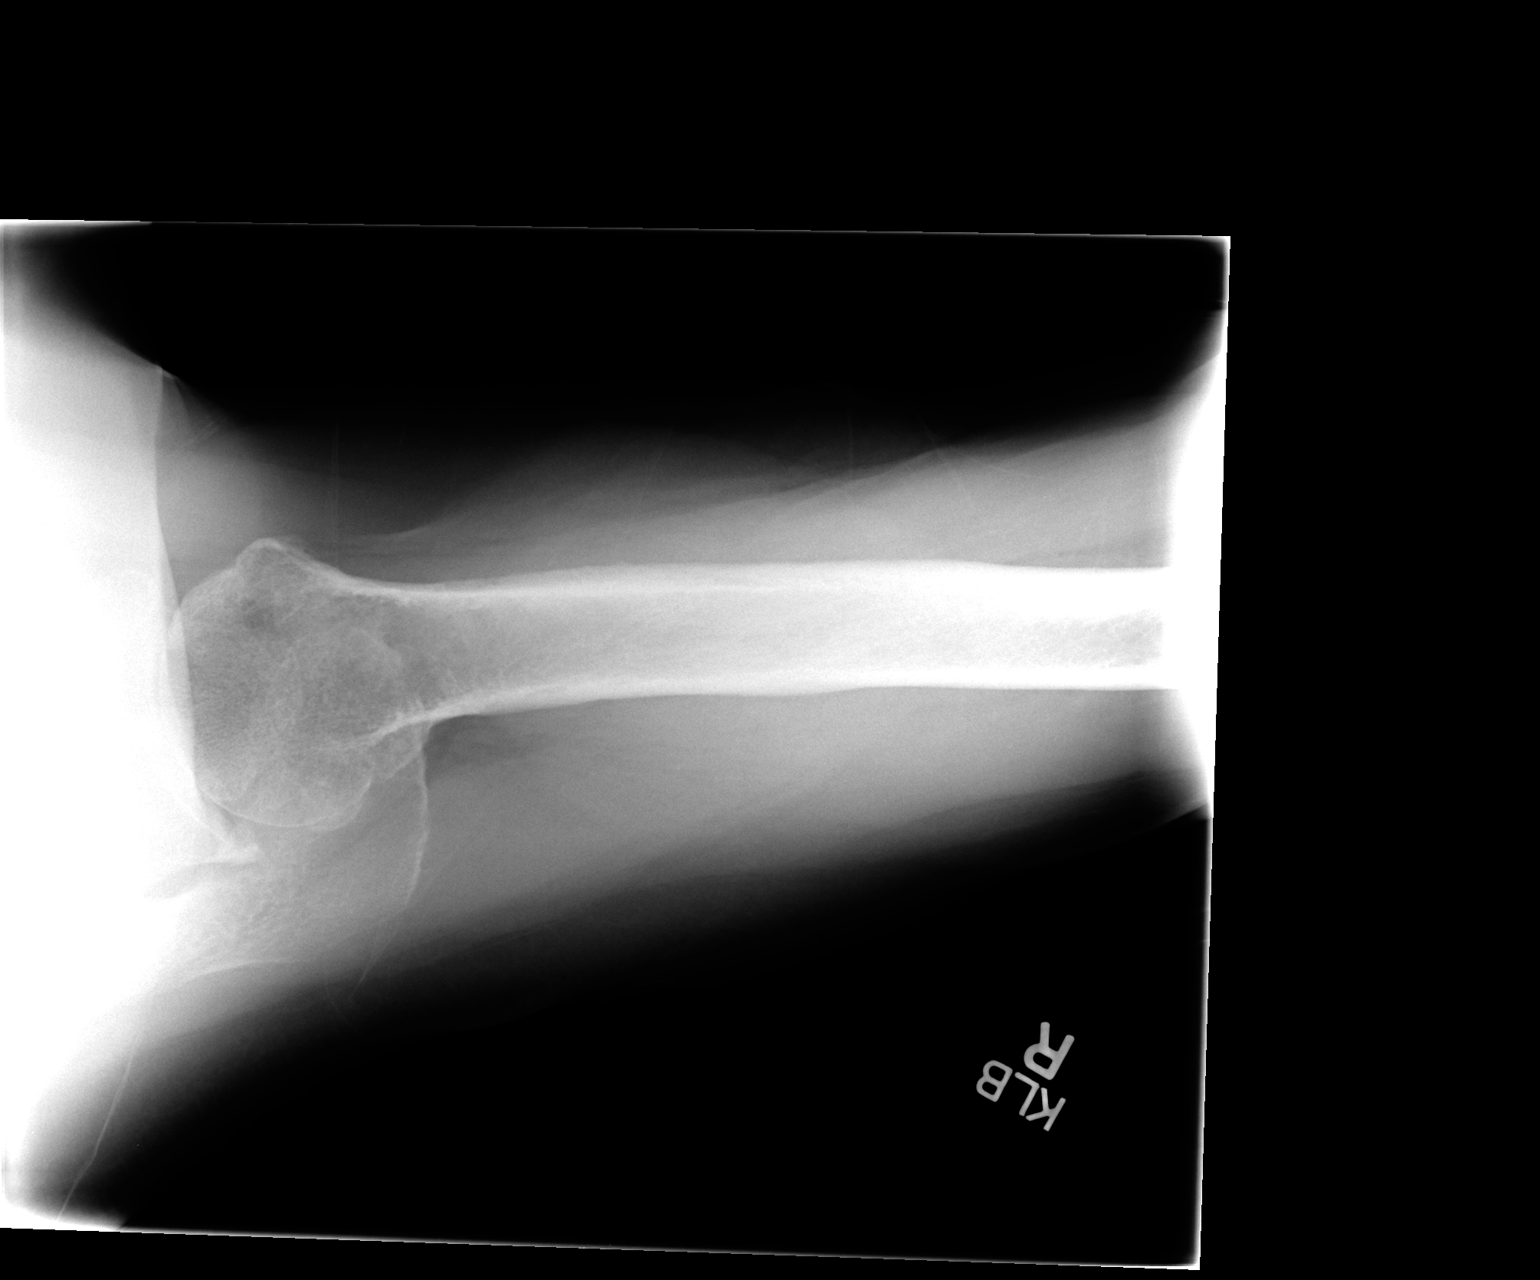

[3 of 3 positions shown; findings below may reference images not displayed]

FINDINGS: There is no evidence of acute fracture or dislocation. Areas of
osteophytosis identified along the humeral head and glenoid.
IMPRESSION: Osteoarthritic changes without evidence of acute osseous
abnormalities.

## 2013-02-09 IMAGING — CR DG LUMBAR SPINE COMPLETE 4+V
6 series · 6 of 6 positions shown · non-contrast
Comparison: None.

CLINICAL DATA: Back pain

EXAM:
LUMBAR SPINE - COMPLETE 4+ VIEW

[view not recorded (1 of 6)]
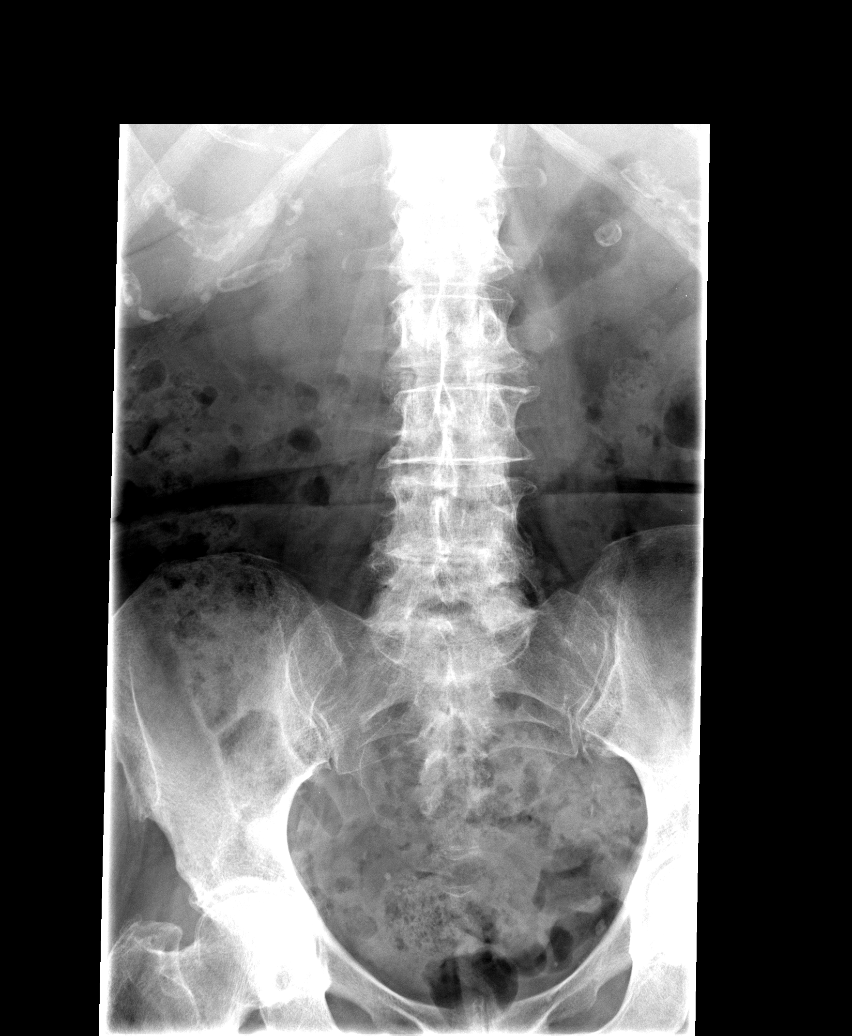

[view not recorded (2 of 6)]
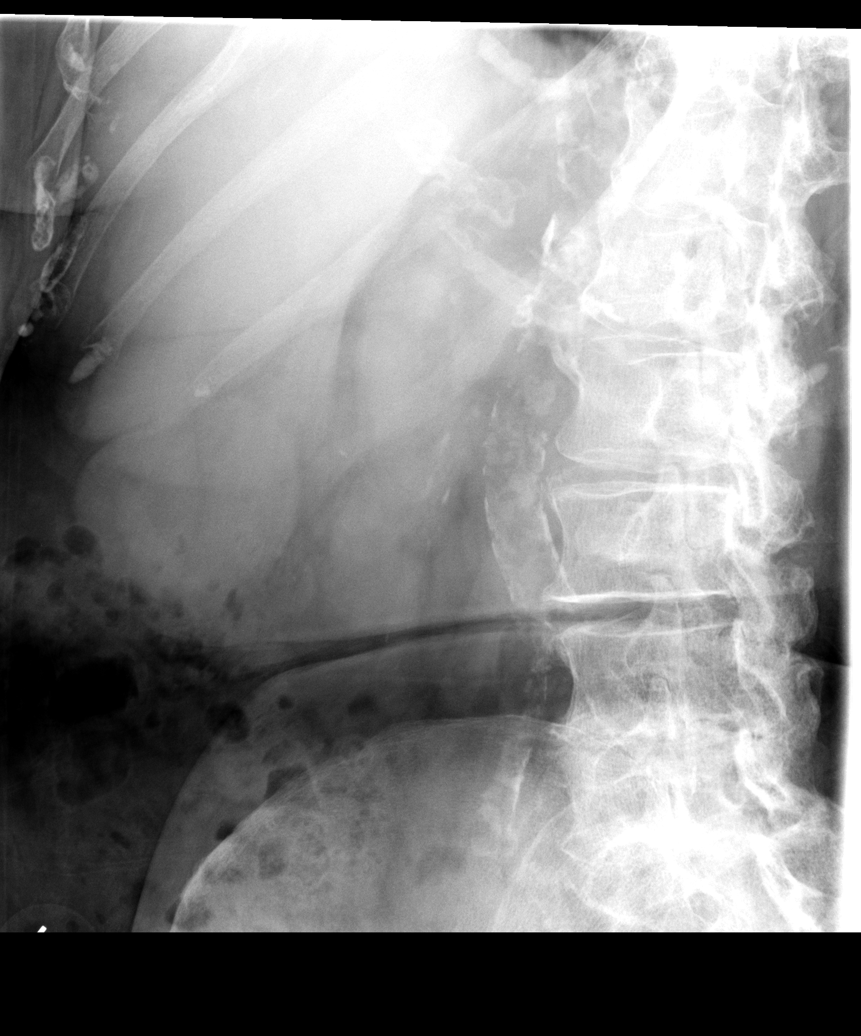

[view not recorded (3 of 6)]
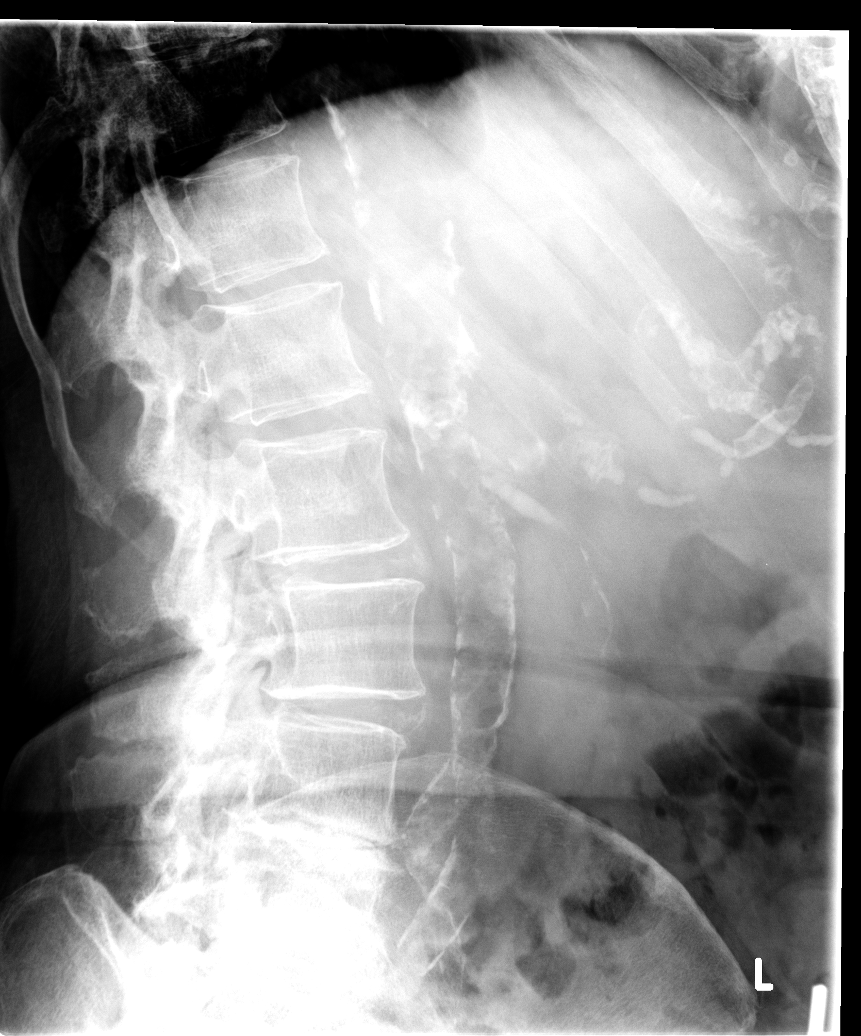

[view not recorded (4 of 6)]
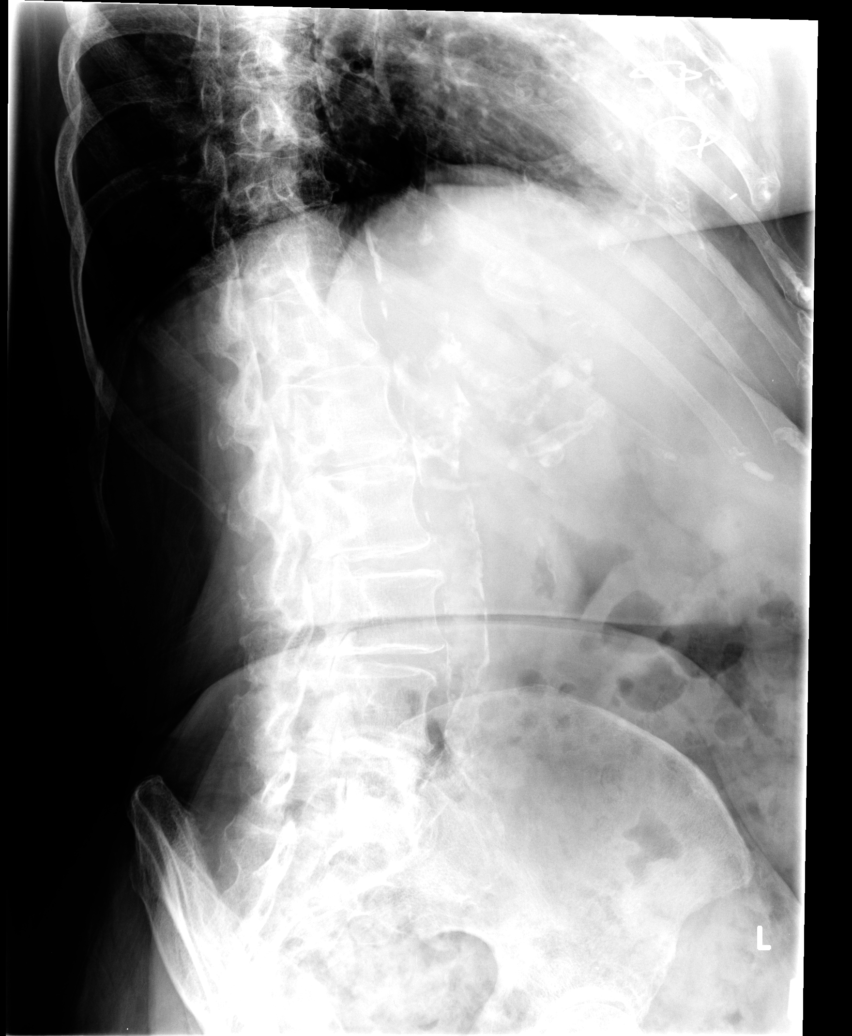

[view not recorded (5 of 6)]
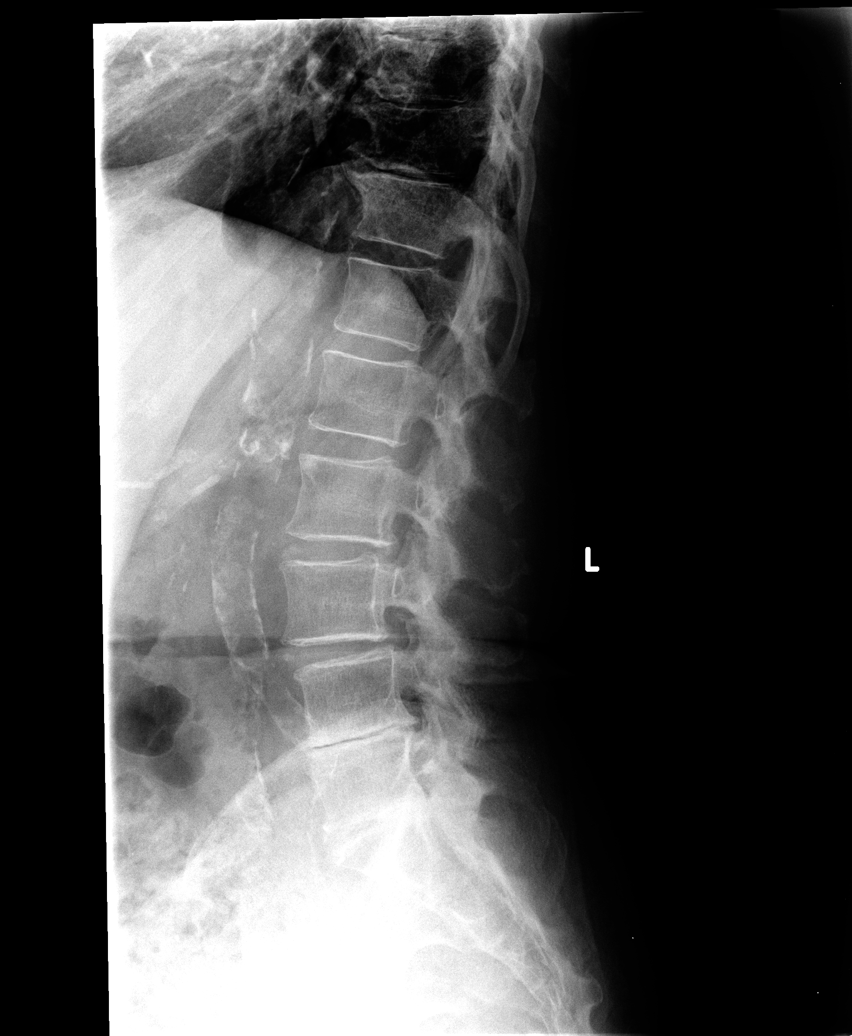

[view not recorded (6 of 6)]
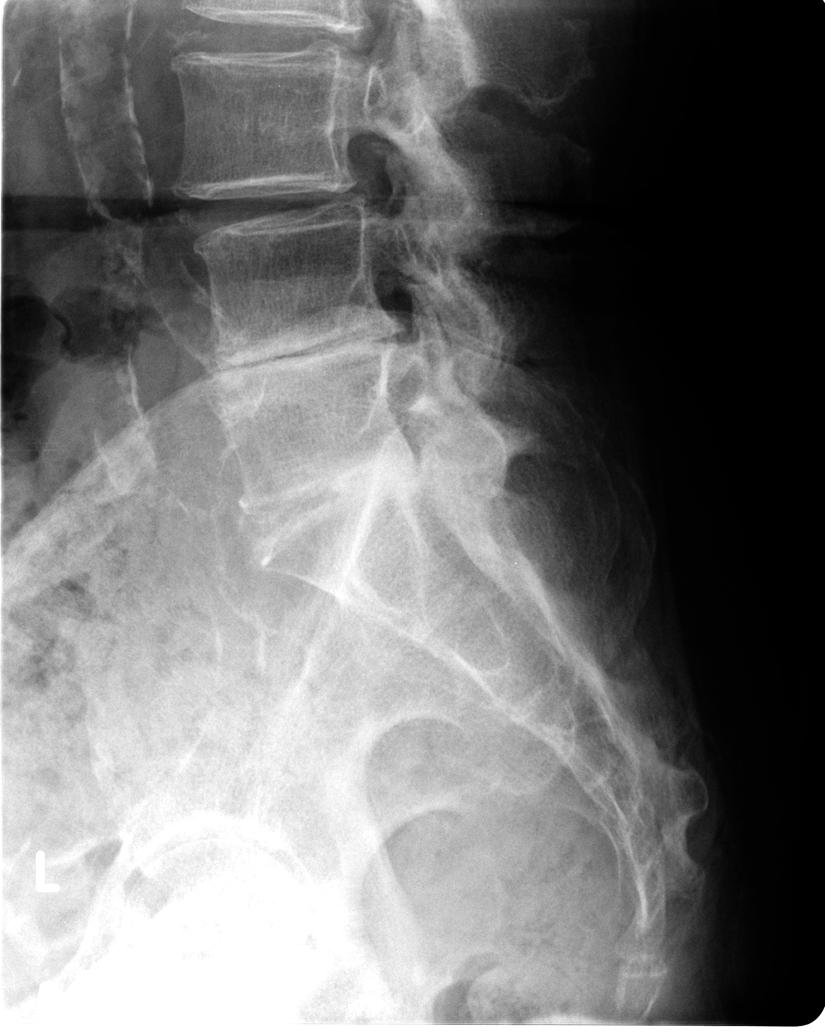

[6 of 6 positions shown; findings below may reference images not displayed]

FINDINGS: There is no evidence of acute fracture nor dislocation. Degenerative
disc disease changes are appreciated within the lower lumbar spine.
The areas of disc space narrowing, subchondral sclerosis and
endplate hypertrophic spurring. Coarse atherosclerotic
calcifications identified within the aorta. Stable calcified density
within the left upper quadrant again likely representing a calcified
splenic artery aneurysm.
IMPRESSION: Multilevel spondylolysis without evidence of acute osseous
abnormalities. Atherosclerotic changes within the aorta and iliac
vessels.

## 2013-02-09 IMAGING — CR DG KNEE 1-2V*R*
2 series · 2 of 2 positions shown · non-contrast
Comparison: None.

CLINICAL DATA: Pain

EXAM:
RIGHT KNEE - 1-2 VIEW

[view not recorded (1 of 2)]
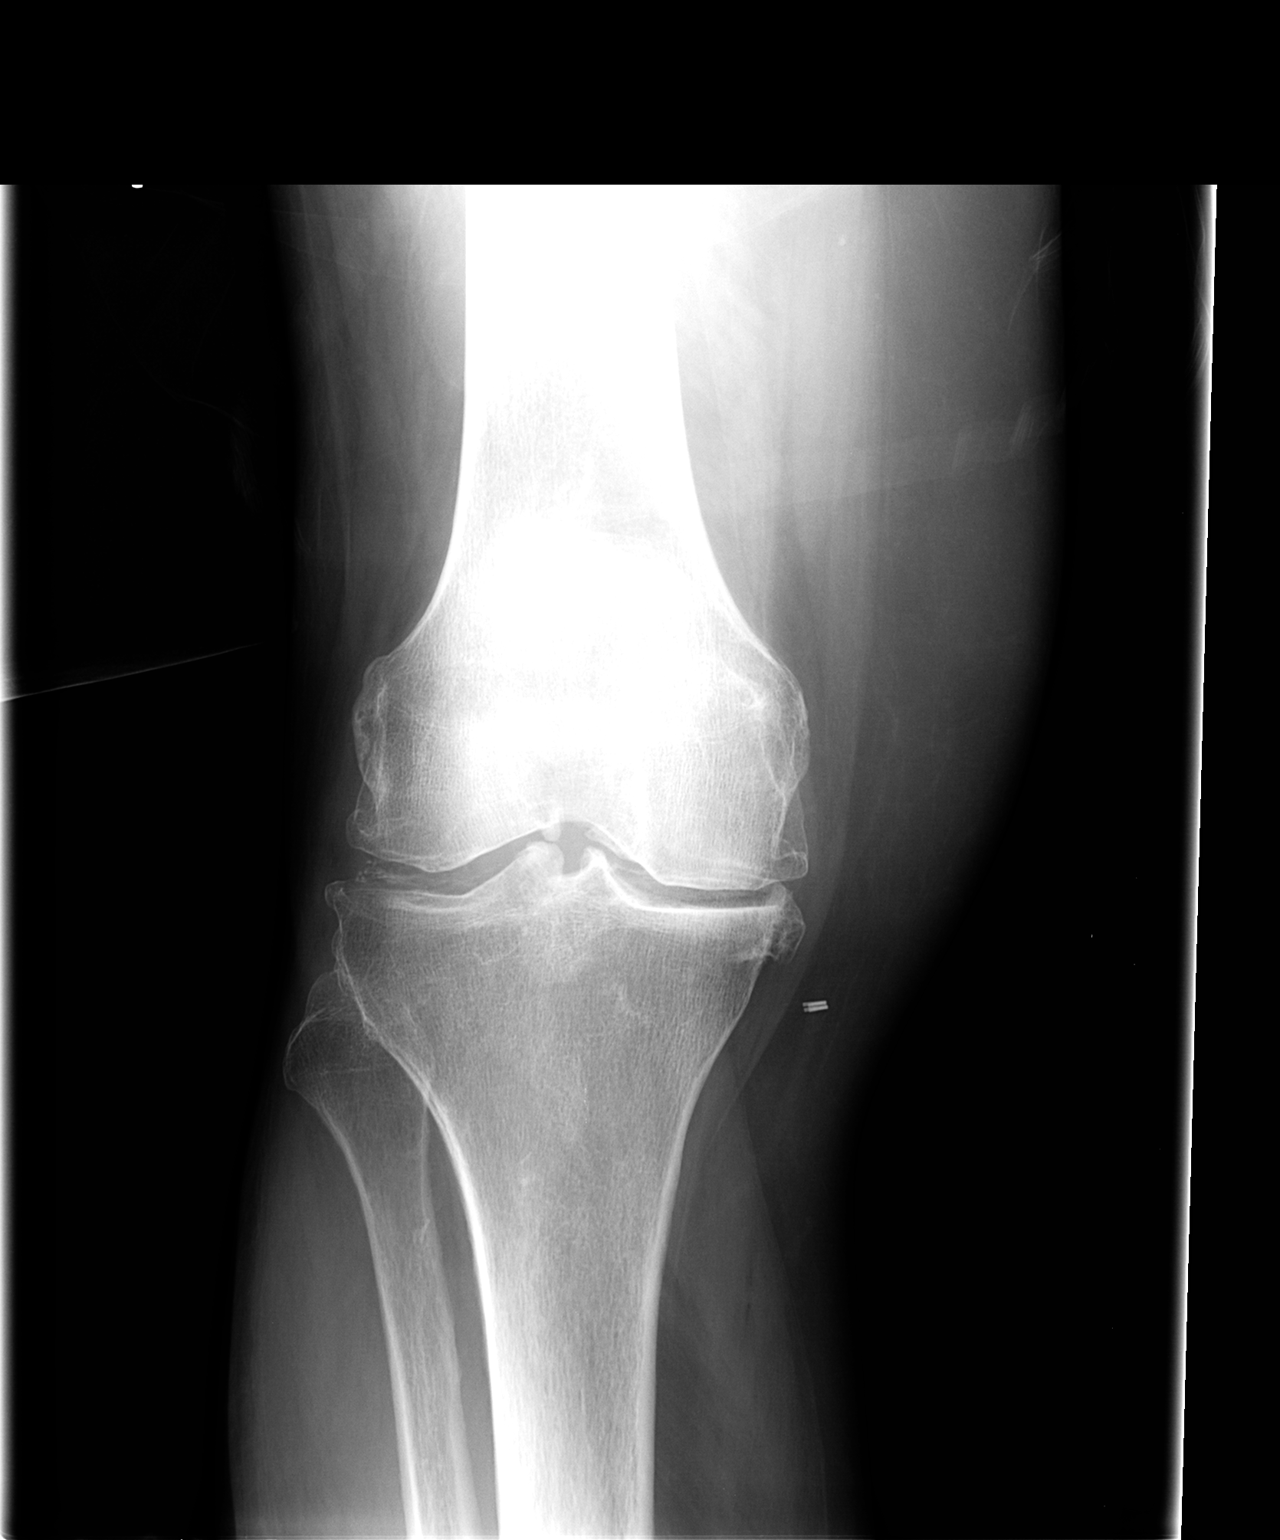

[view not recorded (2 of 2)]
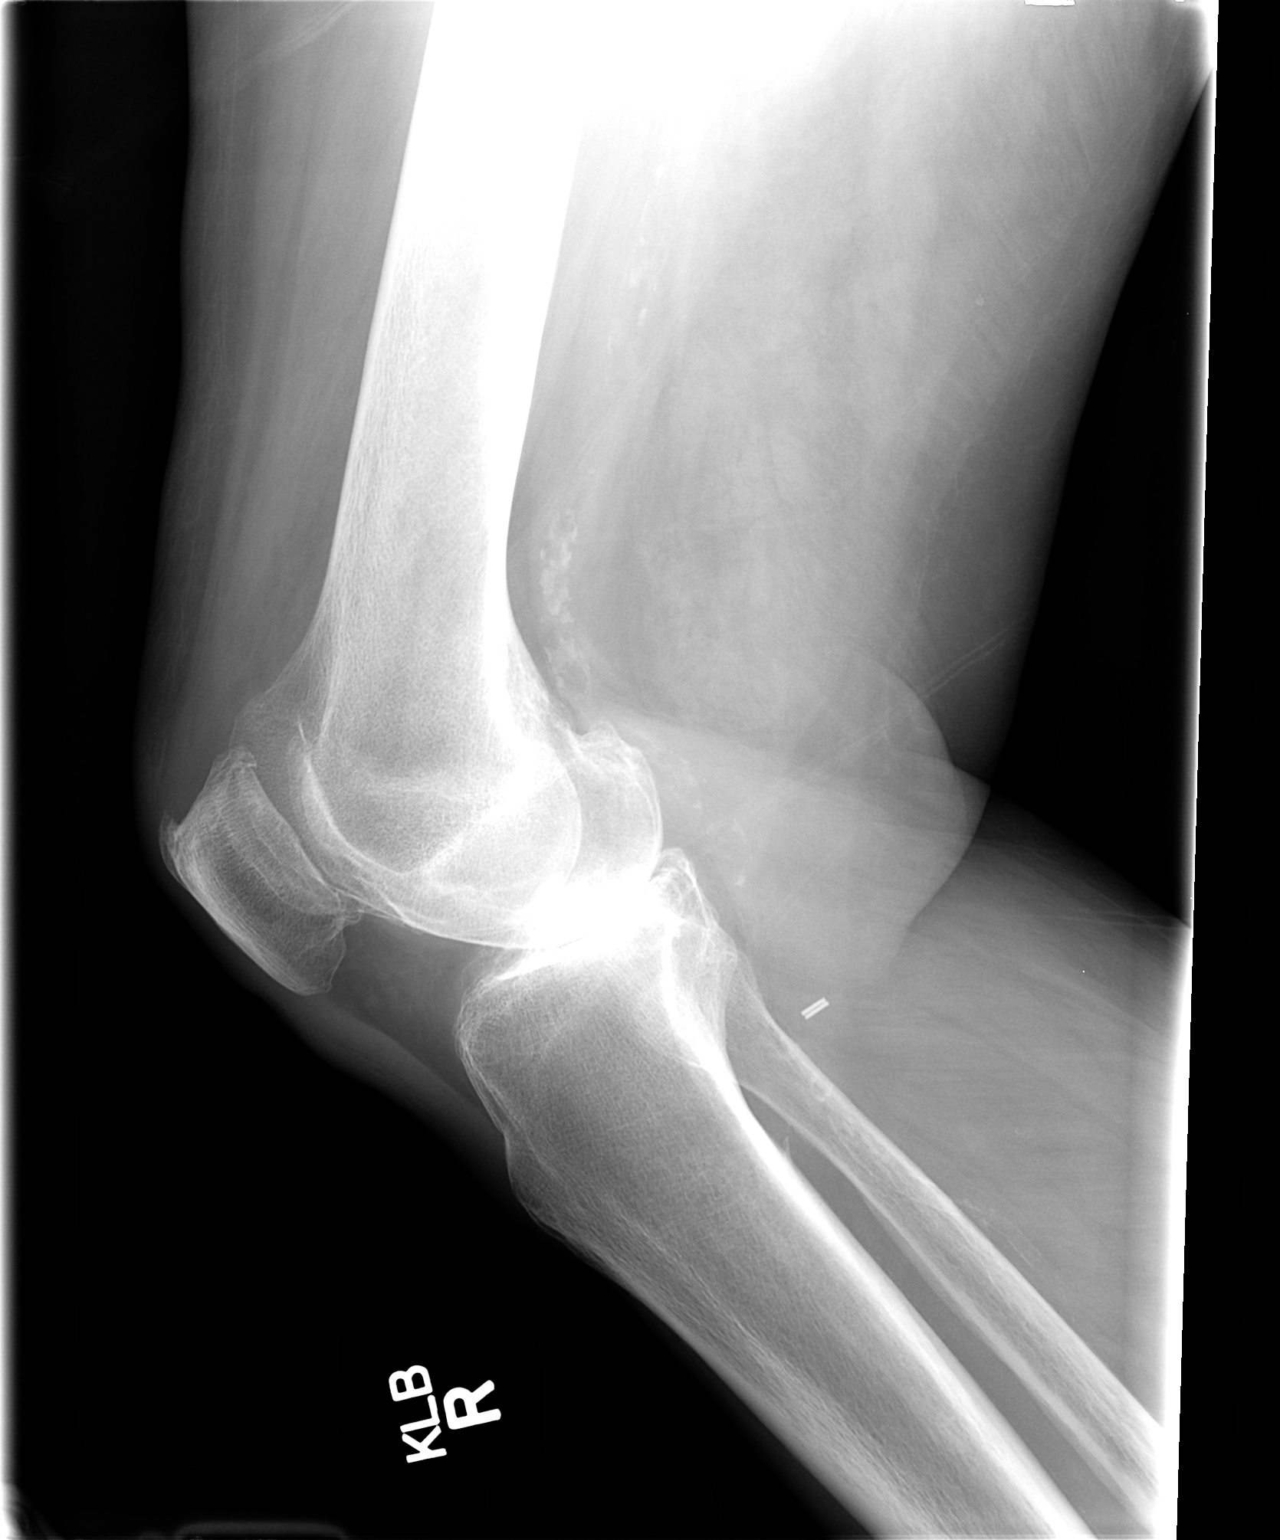

[2 of 2 positions shown; findings below may reference images not displayed]

FINDINGS: Frontal and lateral views were obtained. There is no fracture,
dislocation, or effusion. There is moderately severe narrowing
medially and in the patellofemoral joint. There is extensive
meniscal calcification. There is spurring in all compartments. There
is extensive arterial vascular calcification posteriorly.
IMPRESSION: Extensive osteoarthritic change. No fracture or effusion. There is
extensive meniscal calcification. While meniscal calcification may
be seen with osteoarthritis, it may also be seen with calcium
pyrophosphate deposition disease which may present clinically as
pseudogout.

There is extensive arterial vascular calcification.

## 2013-02-09 IMAGING — CR DG HIP COMPLETE 2+V*R*
2 series · 2 of 2 positions shown · non-contrast
Comparison: None.

CLINICAL DATA: Pain in the right hip when walking without history
of injury

EXAM:
RIGHT HIP - COMPLETE 2+ VIEW

[view not recorded (1 of 2)]
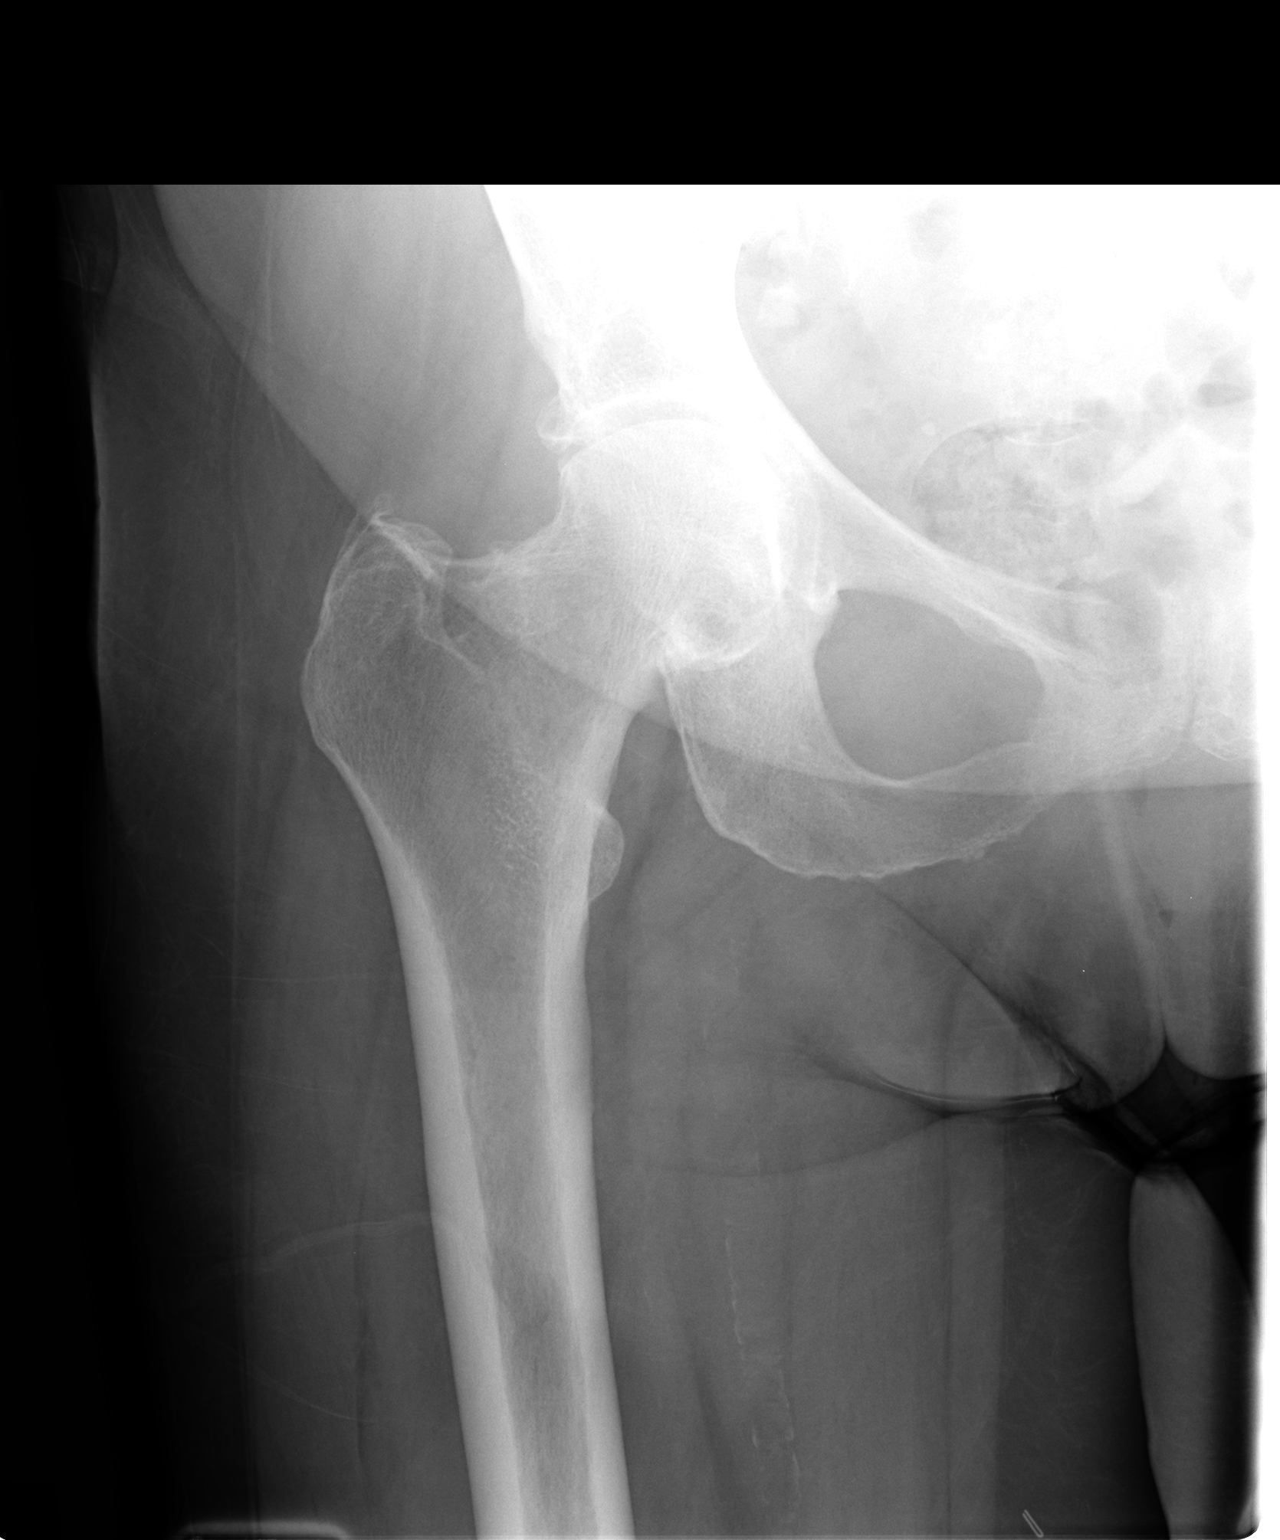

[view not recorded (2 of 2)]
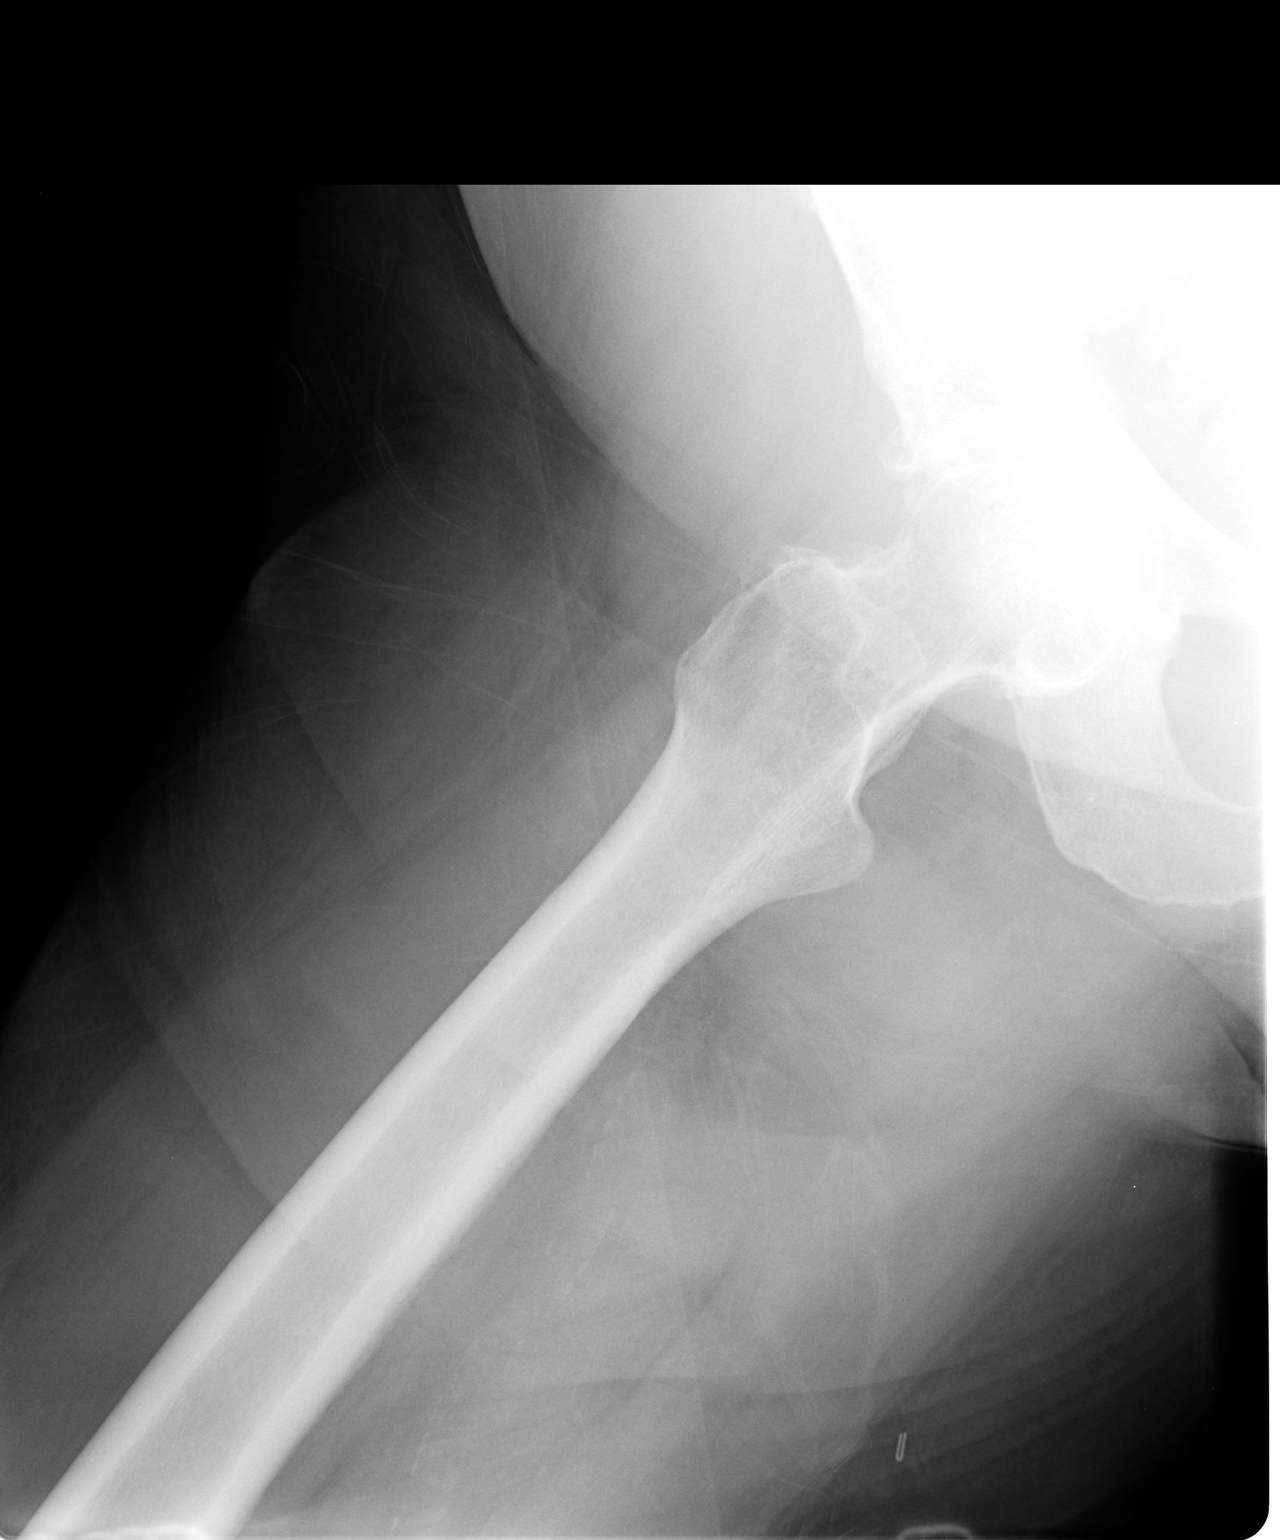

[2 of 2 positions shown; findings below may reference images not displayed]

FINDINGS: The AP and frog-leg lateral views of the right hip reveal the bones
to be reasonably well mineralized for age. There is no lytic or
blastic lesion or evidence of an acute fracture or dislocation.
There is mild narrowing of the hip joint space especially medially
but also superiorly consistent with osteoarthritis.
IMPRESSION: There are changes consistent with mild osteoarthritis. There is no
evidence of an acute fracture nor dislocation or other acute bony
abnormality of the right hip.

## 2013-08-31 ENCOUNTER — Other Ambulatory Visit (HOSPITAL_COMMUNITY): Payer: Self-pay | Admitting: Cardiology

## 2013-08-31 DIAGNOSIS — I6529 Occlusion and stenosis of unspecified carotid artery: Secondary | ICD-10-CM

## 2013-09-04 ENCOUNTER — Ambulatory Visit (INDEPENDENT_AMBULATORY_CARE_PROVIDER_SITE_OTHER): Payer: Medicare Other | Admitting: Family Medicine

## 2013-09-04 VITALS — BP 146/56 | HR 63 | Temp 98.7°F | Resp 20 | Ht 63.0 in | Wt 156.2 lb

## 2013-09-04 DIAGNOSIS — I63512 Cerebral infarction due to unspecified occlusion or stenosis of left middle cerebral artery: Secondary | ICD-10-CM

## 2013-09-04 DIAGNOSIS — S0033XA Contusion of nose, initial encounter: Secondary | ICD-10-CM

## 2013-09-04 DIAGNOSIS — S1093XA Contusion of unspecified part of neck, initial encounter: Secondary | ICD-10-CM

## 2013-09-04 DIAGNOSIS — W19XXXA Unspecified fall, initial encounter: Secondary | ICD-10-CM

## 2013-09-04 DIAGNOSIS — S0003XA Contusion of scalp, initial encounter: Secondary | ICD-10-CM

## 2013-09-04 DIAGNOSIS — S0083XA Contusion of other part of head, initial encounter: Secondary | ICD-10-CM

## 2013-09-04 DIAGNOSIS — S01511A Laceration without foreign body of lip, initial encounter: Secondary | ICD-10-CM

## 2013-09-04 DIAGNOSIS — I6350 Cerebral infarction due to unspecified occlusion or stenosis of unspecified cerebral artery: Secondary | ICD-10-CM

## 2013-09-04 DIAGNOSIS — S01501A Unspecified open wound of lip, initial encounter: Secondary | ICD-10-CM

## 2013-09-04 DIAGNOSIS — M069 Rheumatoid arthritis, unspecified: Secondary | ICD-10-CM

## 2013-09-04 MED ORDER — MUPIROCIN CALCIUM 2 % EX CREA: 1.0000 "application " | TOPICAL_CREAM | Freq: Two times a day (BID) | CUTANEOUS | Status: DC

## 2013-09-04 MED ORDER — MAGIC MOUTHWASH: 5.0000 mL | Freq: Three times a day (TID) | ORAL | Status: DC

## 2013-09-04 NOTE — Progress Notes (Signed)
This chart was scribed for Robyn Haber, MD by Elby Beck, Scribe. This patient was seen in room 1 and the patient's care was started at 2:18 PM.  @UMFCLOGO @  Patient ID: [name] MRN: [id number], DOB: [birth year], 80 y.o. Date of Encounter: 09/04/2013, 2:27 PM  Primary Physician: Foye Spurling, MD  Chief Complaint: Fall  HPI: 80 y.o. year old female with a history of with history below presents complaining after a mechanical fall that occurred about 4 hours ago. She states that she tripped and fell forward, landing on concrete. She states that fell on her outstretched arms, but that she also hit her face upon falling. She denies any oral trauma and denies any headache or LOC. She sustained an abrasion to her nose and states that she has some pain to the abrasion. She has applied ice to the area with some relief of her pain. She states that she did not sustain any other injuries at the time of the fall. She reports that she normally ambulates with a cane at baseline, but that she was not using her cane when she fell. She was driven to [facility] by a friend who will be picking her up.   Past Medical History  Diagnosis Date  . Rheumatoid arthritis(714.0)   . Unspecified essential hypertension   . Other and unspecified hyperlipidemia   . Cerebrovascular disease, unspecified   . Coronary atherosclerosis of unspecified type of vessel, native or graft   . Diabetes mellitus without complication      Home Meds: Prior to Admission medications   Medication Sig Start Date End Date Taking? Authorizing Provider  acetaminophen (TYLENOL) 500 MG tablet Take 500 mg by mouth every 6 (six) hours as needed.     Yes Historical Provider, MD  amLODipine (NORVASC) 5 MG tablet Take 5 mg by mouth daily.     Yes Historical Provider, MD  Calcium Carb-Vit D-Soy Isoflav (CALTRATE 600 + SOY PO) Take by mouth.     Yes Historical Provider, MD  carvedilol (COREG) 12.5 MG tablet Take 12.5 mg by mouth 2 (two) times  daily with a meal.     Yes Historical Provider, MD  dipyridamole-aspirin (AGGRENOX) 25-200 MG per 12 hr capsule Take 1 capsule by mouth 2 (two) times daily.     Yes Historical Provider, MD  fish oil-omega-3 fatty acids 1000 MG capsule Take 2 g by mouth daily.     Yes Historical Provider, MD  glimepiride (AMARYL) 2 MG tablet Take 2 mg by mouth daily before breakfast.     Yes Historical Provider, MD  glucosamine-chondroitin 500-400 MG tablet Take 1 tablet by mouth 3 (three) times daily.     Yes Historical Provider, MD  Multiple Vitamin (MULTIVITAMIN) capsule Take 1 capsule by mouth daily.     Yes Historical Provider, MD  nitroGLYCERIN (NITROSTAT) 0.4 MG SL tablet Place 1 tablet (0.4 mg total) under the tongue every 5 (five) minutes as needed. 07/04/10  Yes Renella Cunas, MD  rosuvastatin (CRESTOR) 5 MG tablet Take 5 mg by mouth daily.     Yes Historical Provider, MD  valsartan-hydrochlorothiazide (DIOVAN-HCT) 160-12.5 MG per tablet Take 1 tablet by mouth daily.     Yes Historical Provider, MD    Allergies: No Known Allergies  History   Social History  . Marital Status: Widowed    Spouse Name: N/A    Number of Children: N/A  . Years of Education: N/A   Occupational History  . Not on file.   Social  History Main Topics  . Smoking status: Never Smoker   . Smokeless tobacco: Never Used  . Alcohol Use: No  . Drug Use: No  . Sexual Activity: Not on file   Other Topics Concern  . Not on file   Social History Narrative   Retired   Tobacco use- no   Alcohol use-no           Review of Systems: Constitutional: negative for chills, fever, night sweats, weight changes, or fatigue  HEENT: negative for vision changes, hearing loss, congestion, rhinorrhea, ST, epistaxis, or sinus pressure Cardiovascular: negative for chest pain or palpitations Respiratory: negative for hemoptysis, wheezing, shortness of breath, or cough Abdominal: negative for abdominal pain, nausea, vomiting, diarrhea, or  constipation Dermatological: negative for rash Neurologic: negative for headache, dizziness, or syncope All other systems reviewed and are otherwise negative with the exception to those above and in the HPI.   Physical Exam: Blood pressure 146/56, pulse 63, temperature 98.7 F (37.1 C), temperature source Oral, resp. rate 20, height 5\' 3"  (1.6 m), weight 156 lb 4 oz (70.875 kg), SpO2 98.00%., Body mass index is 27.69 kg/(m^2). General: Well developed, well nourished, in no acute distress. Head: Normocephalic, atraumatic, eyes without discharge, sclera non-icteric, nares are without discharge. Bilateral auditory canals clear, TM's are without perforation, pearly grey and translucent with reflective cone of light bilaterally. Oral cavity moist, posterior pharynx without exudate, erythema, peritonsillar abscess, or post nasal drip. Swollen left upper lip.  Teeth intact, small internal lac. Neck: Supple. No thyromegaly. Full ROM. No lymphadenopathy. Lungs: Clear bilaterally to auscultation without wheezes, rales, or rhonchi. Breathing is unlabored. Heart: RRR with S1 S2. No murmurs, rubs, or gallops appreciated. Abdomen: Soft, non-tender, non-distended with normoactive bowel sounds. No hepatomegaly. No rebound/guarding. No obvious abdominal masses. Msk:  Strength and tone normal for age. Extremities/Skin: Warm and dry. No clubbing or cyanosis. No edema. No rashes or suspicious lesions.  1 cm abrasion over bridge of nose Neuro: Alert and oriented X 3. Moves all extremities spontaneously. Gait is normal. CNII-XII grossly in tact. Psych:  Responds to questions appropriately with a normal affect.     ASSESSMENT AND PLAN:  80 y.o. year old female with Nasal contusion, initial encounter - Plan: mupirocin cream (BACTROBAN) 2 %  Lip laceration, initial encounter - Plan: Alum & Mag Hydroxide-Simeth (MAGIC MOUTHWASH) SOLN  Fall, initial encounter  Cerebral infarction due to occlusion of left middle  cerebral artery  Rheumatoid arthritis     Signed, Robyn Haber, MD 09/04/2013 2:27 PM

## 2013-09-04 NOTE — Patient Instructions (Signed)

## 2013-09-05 ENCOUNTER — Ambulatory Visit (HOSPITAL_COMMUNITY): Payer: Medicare Other | Attending: Interventional Cardiology | Admitting: Radiology

## 2013-09-05 DIAGNOSIS — I2510 Atherosclerotic heart disease of native coronary artery without angina pectoris: Secondary | ICD-10-CM | POA: Insufficient documentation

## 2013-09-05 DIAGNOSIS — I658 Occlusion and stenosis of other precerebral arteries: Secondary | ICD-10-CM | POA: Insufficient documentation

## 2013-09-05 DIAGNOSIS — I6529 Occlusion and stenosis of unspecified carotid artery: Secondary | ICD-10-CM | POA: Insufficient documentation

## 2013-09-05 DIAGNOSIS — Z8673 Personal history of transient ischemic attack (TIA), and cerebral infarction without residual deficits: Secondary | ICD-10-CM | POA: Insufficient documentation

## 2013-09-05 DIAGNOSIS — I10 Essential (primary) hypertension: Secondary | ICD-10-CM | POA: Insufficient documentation

## 2013-09-05 NOTE — Progress Notes (Signed)
Carotid Duplex performed. 

## 2013-09-12 ENCOUNTER — Telehealth: Payer: Self-pay | Admitting: Cardiology

## 2013-09-12 NOTE — Telephone Encounter (Signed)
New message ° ° ° ° °Want carotid results °

## 2013-09-12 NOTE — Telephone Encounter (Signed)
Pt is aware of Carotid Duplex results. Pt was Dr. Winnifred Friar pt; her last office visit was 07/01/2010. Did Dr. Verl Blalock  assigned pt to be Dr. Kyla Balzarine pt? Pt wants to make an appointment; pt  did not know who was her Cardiologist.

## 2013-09-19 NOTE — Telephone Encounter (Signed)
Follow Up     Pt calling following up on test results. Please call.

## 2013-09-19 NOTE — Telephone Encounter (Signed)
Follow up     Calling for test results.

## 2013-09-19 NOTE — Telephone Encounter (Signed)
Patient calling to see if it has been determined if she was transferred to Dr. Johnsie Cancel from Dr. Verl Blalock.  Appointment for New Patient made for 10/25/13 at 1:45 pm. Patient knows to be here at 1:30 pm.

## 2013-10-21 ENCOUNTER — Encounter: Payer: Self-pay | Admitting: Cardiovascular Disease

## 2013-10-25 ENCOUNTER — Encounter: Payer: Self-pay | Admitting: Cardiovascular Disease

## 2013-10-25 ENCOUNTER — Ambulatory Visit: Payer: Medicare Other | Admitting: Cardiovascular Disease

## 2013-10-25 ENCOUNTER — Ambulatory Visit (INDEPENDENT_AMBULATORY_CARE_PROVIDER_SITE_OTHER): Payer: Medicare Other | Admitting: Cardiovascular Disease

## 2013-10-25 VITALS — BP 138/60 | HR 69 | Ht 63.0 in | Wt 162.9 lb

## 2013-10-25 DIAGNOSIS — M069 Rheumatoid arthritis, unspecified: Secondary | ICD-10-CM

## 2013-10-25 DIAGNOSIS — I2510 Atherosclerotic heart disease of native coronary artery without angina pectoris: Secondary | ICD-10-CM

## 2013-10-25 DIAGNOSIS — I10 Essential (primary) hypertension: Secondary | ICD-10-CM

## 2013-10-25 DIAGNOSIS — E785 Hyperlipidemia, unspecified: Secondary | ICD-10-CM

## 2013-10-25 DIAGNOSIS — I679 Cerebrovascular disease, unspecified: Secondary | ICD-10-CM

## 2013-10-25 NOTE — Patient Instructions (Signed)
Your physician wants you to follow-up in: YEAR WITH DR NISHAN  You will receive a reminder letter in the mail two months in advance. If you don't receive a letter, please call our office to schedule the follow-up appointment.  Your physician recommends that you continue on your current medications as directed. Please refer to the Current Medication list given to you today. 

## 2013-10-25 NOTE — Assessment & Plan Note (Signed)
Distant history of frontal lobe CVA  Recent carotids reviewed 1-39% bilateral disease F/U duplex 2 years Aggrenox and ASA

## 2013-10-25 NOTE — Progress Notes (Signed)
Patient ID: [name], female   DOB: May 22, 1927, 80 y.o.   MRN: [id number]   80 yo previously seen by TW Not seen in over 3 years  Had indigestion 2003  with IMI  Had culprit vessel stenting proximal and mid RCA followed by CABG by Dr Cyndia Bent  Coronary artery bypass grafting x 4 of the left internal mammary to the  LAD, saphenous vein graft to the diagonal and OM sequentially, saphenous  vein graft to right coronary artery April 15, 2001, Dr. Cyndia Bent.    CRF DM and HTN  No further indigestion/ chest pain.  Activity limited by arthritis  Seeing Dr Ouida Sills now Affects all her joints  Indicates Jeanann Lewandowsky as primary He follows lipids and A1c and she indicates both in good range  Has fresh nitro and compliant with meds       ROS: Denies fever, malais, weight loss, blurry vision, decreased visual acuity, cough, sputum, SOB, hemoptysis, pleuritic pain, palpitaitons, heartburn, abdominal pain, melena, lower extremity edema, claudication, or rash.  All other systems reviewed and negative   General: Affect appropriate Healthy:  appears stated age 58: normal Neck supple with no adenopathy JVP normal no bruits no thyromegaly Lungs clear with no wheezing and good diaphragmatic motion Heart:  S1/S2 no murmur,rub, gallop or click sternotomy  PMI normal Abdomen: benighn, BS positve, no tenderness, no AAA no bruit.  No HSM or HJR Distal pulses intact with no bruits No edema Neuro non-focal Skin warm and dry No muscular weakness  Medications Current Outpatient Prescriptions  Medication Sig Dispense Refill  . acetaminophen (TYLENOL) 500 MG tablet Take 500 mg by mouth every 6 (six) hours as needed.        . Alum & Mag Hydroxide-Simeth (MAGIC MOUTHWASH) SOLN Take 5 mLs by mouth 3 (three) times daily.  100 mL  0  . amLODipine (NORVASC) 5 MG tablet Take 5 mg by mouth daily.        . Calcium Carb-Vit D-Soy Isoflav (CALTRATE 600 + SOY PO) Take by mouth.        . carvedilol (COREG)  12.5 MG tablet Take 12.5 mg by mouth 2 (two) times daily with a meal.        . dipyridamole-aspirin (AGGRENOX) 25-200 MG per 12 hr capsule Take 1 capsule by mouth 2 (two) times daily.        . fish oil-omega-3 fatty acids 1000 MG capsule Take 2 g by mouth daily.        [name] glimepiride (AMARYL) 2 MG tablet Take 2 mg by mouth daily before breakfast.        . glucosamine-chondroitin 500-400 MG tablet Take 1 tablet by mouth 3 (three) times daily.        . Multiple Vitamin (MULTIVITAMIN) capsule Take 1 capsule by mouth daily.        . mupirocin cream (BACTROBAN) 2 % Apply 1 application topically 2 (two) times daily.  15 g  0  . nitroGLYCERIN (NITROSTAT) 0.4 MG SL tablet Place 1 tablet (0.4 mg total) under the tongue every 5 (five) minutes as needed.  25 tablet  2  . rosuvastatin (CRESTOR) 5 MG tablet Take 5 mg by mouth daily.        . valsartan-hydrochlorothiazide (DIOVAN-HCT) 160-12.5 MG per tablet Take 1 tablet by mouth daily.         No current facility-administered medications for this visit.    Allergies Review of patient's allergies indicates no known allergies.  Family History: Family History  Problem Relation Age of Onset  . Diabetes Neg Hx   . Coronary artery disease Neg Hx   . Cancer Mother   . Hypertension Father   . Cancer Brother   . Hypertension Paternal Grandmother   . Heart disease Brother     Social History: History   Social History  . Marital Status: Widowed    Spouse Name: N/A    Number of Children: N/A  . Years of Education: N/A   Occupational History  . Not on file.   Social History Main Topics  . Smoking status: Never Smoker   . Smokeless tobacco: Never Used  . Alcohol Use: No  . Drug Use: No  . Sexual Activity: Not on file   Other Topics Concern  . Not on file   Social History Narrative   Retired   Tobacco use- no   Alcohol use-no          Electrocardiogram:  SR rate 78 PR 278 [id number]   Assessment and Plan

## 2013-10-25 NOTE — Assessment & Plan Note (Signed)
F/u Dr Ouida Sills rheumatrex new med some help since January

## 2013-10-25 NOTE — Assessment & Plan Note (Signed)
Distant bypass Previous angina indigestion equivalent not recurrent  ON good medical Rx  Has nitro No indication for stress testing in asymptomatic individual Stable

## 2013-10-25 NOTE — Assessment & Plan Note (Signed)
Well controlled.  Continue current medications and low sodium Dash type diet.    

## 2013-10-25 NOTE — Assessment & Plan Note (Signed)
Cholesterol is at goal.  Continue current dose of statin and diet Rx.  No myalgias or side effects.  F/U  LFT's in 6 months. No results found for this basename: [facility]  Labs with Dr Carlis Abbott

## 2014-02-11 ENCOUNTER — Ambulatory Visit (INDEPENDENT_AMBULATORY_CARE_PROVIDER_SITE_OTHER): Payer: Medicare Other | Admitting: Family Medicine

## 2014-02-11 VITALS — BP 142/86 | HR 75 | Temp 98.6°F | Resp 16 | Ht 62.5 in | Wt 161.0 lb

## 2014-02-11 DIAGNOSIS — N183 Chronic kidney disease, stage 3 unspecified: Secondary | ICD-10-CM | POA: Insufficient documentation

## 2014-02-11 DIAGNOSIS — J029 Acute pharyngitis, unspecified: Secondary | ICD-10-CM

## 2014-02-11 DIAGNOSIS — E1122 Type 2 diabetes mellitus with diabetic chronic kidney disease: Secondary | ICD-10-CM | POA: Insufficient documentation

## 2014-02-11 DIAGNOSIS — J069 Acute upper respiratory infection, unspecified: Secondary | ICD-10-CM

## 2014-02-11 DIAGNOSIS — E119 Type 2 diabetes mellitus without complications: Secondary | ICD-10-CM | POA: Insufficient documentation

## 2014-02-11 DIAGNOSIS — J209 Acute bronchitis, unspecified: Secondary | ICD-10-CM

## 2014-02-11 DIAGNOSIS — E1169 Type 2 diabetes mellitus with other specified complication: Secondary | ICD-10-CM | POA: Insufficient documentation

## 2014-02-11 MED ORDER — AMOXICILLIN 875 MG PO TABS: 875.0000 mg | ORAL_TABLET | Freq: Two times a day (BID) | ORAL | Status: DC

## 2014-02-11 MED ORDER — HYDROCODONE-HOMATROPINE 5-1.5 MG/5ML PO SYRP: ORAL_SOLUTION | ORAL | Status: DC

## 2014-02-11 NOTE — Progress Notes (Signed)
Subjective: 80 year old independent lady who is here complaining of a cough for the last 3 days. She had a cold last week prior to this. She has a little bit of soreness in her throat. She is coughing up yellow mucus. She does get a little wheezy at times. The over-the-counter DM cough syrup did not seem to control cough for her. She does not smoke. She is diabetic and has heart disease, and is on some various medications. Her sugars usually apparently run okay.  Objective: Alert and oriented pleasant lady in no major distress. Her TMs are normal. Throat mildly erythematous. Neck supple without significant nodes. Chest is clear bilaterally to auscultation. No wheezes could be heard. Heart regular without murmurs.  Assessment: Bronchitis secondary to recent URI Type 2 diabetes  Plan: I do not think x-rays and lab studies are needed today. Will treat with medications. If she is getting worse she is to return or see her primary.  Patient is not actually taking the methotrexate right now so amoxicillin interaction is not of concern.

## 2014-02-11 NOTE — Patient Instructions (Signed)
Drink plenty of fluids and get enough rest  Take the amoxicillin one twice daily for infection  Take the cough syrup 1/2-1 teaspoon every 4-6 hours when needed for the coughing. It can cause constipation and/or drowsiness, so I would prefer you to start with a low-dose and make sure you tolerate it.  Return if worse

## 2014-06-11 ENCOUNTER — Ambulatory Visit (INDEPENDENT_AMBULATORY_CARE_PROVIDER_SITE_OTHER): Payer: Medicare Other

## 2014-06-11 ENCOUNTER — Other Ambulatory Visit: Payer: Self-pay | Admitting: Physician Assistant

## 2014-06-11 ENCOUNTER — Ambulatory Visit (INDEPENDENT_AMBULATORY_CARE_PROVIDER_SITE_OTHER): Payer: Medicare Other | Admitting: Physician Assistant

## 2014-06-11 VITALS — BP 144/52 | HR 77 | Temp 98.5°F | Resp 16 | Ht 63.0 in | Wt 161.0 lb

## 2014-06-11 DIAGNOSIS — Z8639 Personal history of other endocrine, nutritional and metabolic disease: Secondary | ICD-10-CM

## 2014-06-11 DIAGNOSIS — R05 Cough: Secondary | ICD-10-CM

## 2014-06-11 DIAGNOSIS — R0981 Nasal congestion: Secondary | ICD-10-CM

## 2014-06-11 DIAGNOSIS — R059 Cough, unspecified: Secondary | ICD-10-CM

## 2014-06-11 DIAGNOSIS — J189 Pneumonia, unspecified organism: Secondary | ICD-10-CM | POA: Diagnosis not present

## 2014-06-11 LAB — POCT CBC
Granulocyte percent: 81.6 %G — AB (ref 37–80)
HEMATOCRIT: 33.2 % — AB (ref 37.7–47.9)
Hemoglobin: 10.6 g/dL — AB (ref 12.2–16.2)
Lymph, poc: 0.6 (ref 0.6–3.4)
MCH: 31.2 pg (ref 27–31.2)
MCHC: 32.1 g/dL (ref 31.8–35.4)
MCV: 97.4 fL — AB (ref 80–97)
MID (CBC): 0.3 (ref 0–0.9)
MPV: 7.4 fL (ref 0–99.8)
POC Granulocyte: 3.8 (ref 2–6.9)
POC LYMPH %: 12.8 % (ref 10–50)
POC MID %: 5.6 %M (ref 0–12)
Platelet Count, POC: 181 10*3/uL (ref 142–424)
RBC: 3.41 M/uL — AB (ref 4.04–5.48)
RDW, POC: 13.7 %
WBC: 4.6 10*3/uL (ref 4.6–10.2)

## 2014-06-11 LAB — POCT GLYCOSYLATED HEMOGLOBIN (HGB A1C): HEMOGLOBIN A1C: 6.6

## 2014-06-11 IMAGING — CR DG CHEST 2V
2 series · 2 of 2 positions shown · non-contrast
Comparison: [DATE]

CLINICAL DATA: Nasal congestion and cough

EXAM:
CHEST  2 VIEW

[PA]
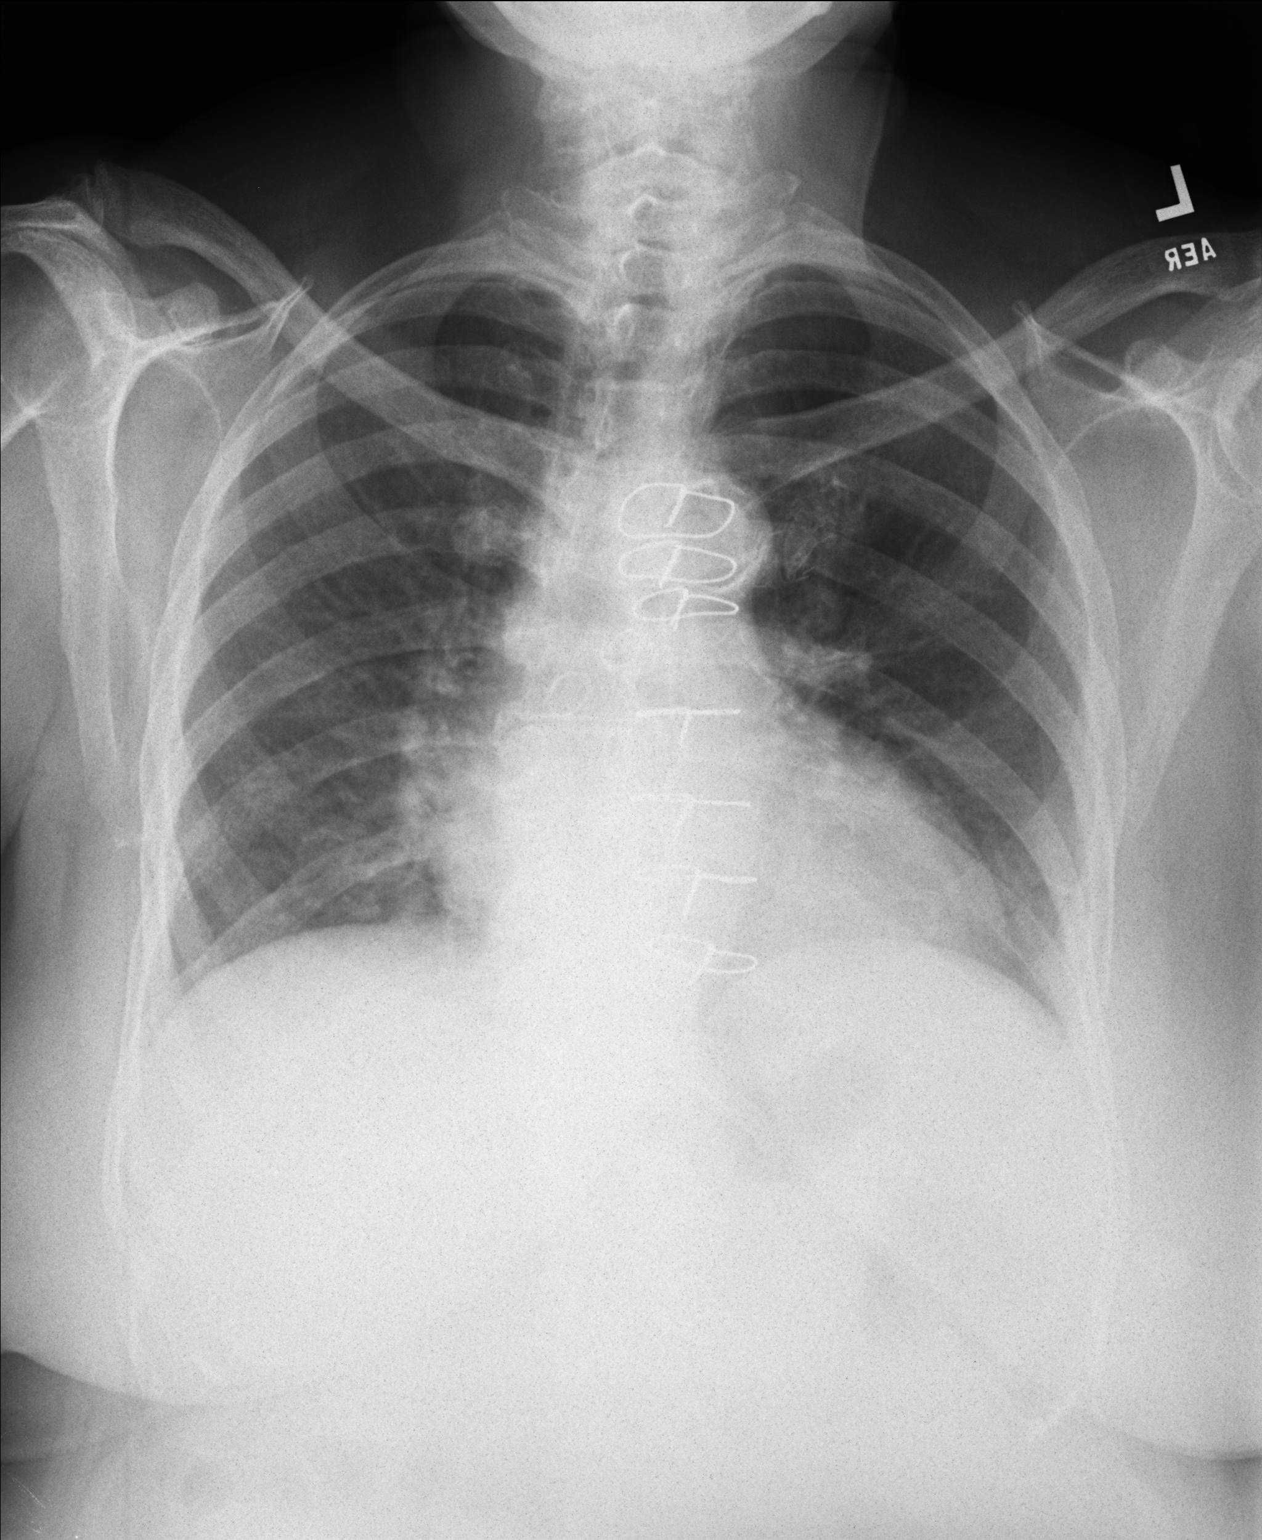

[lateral]
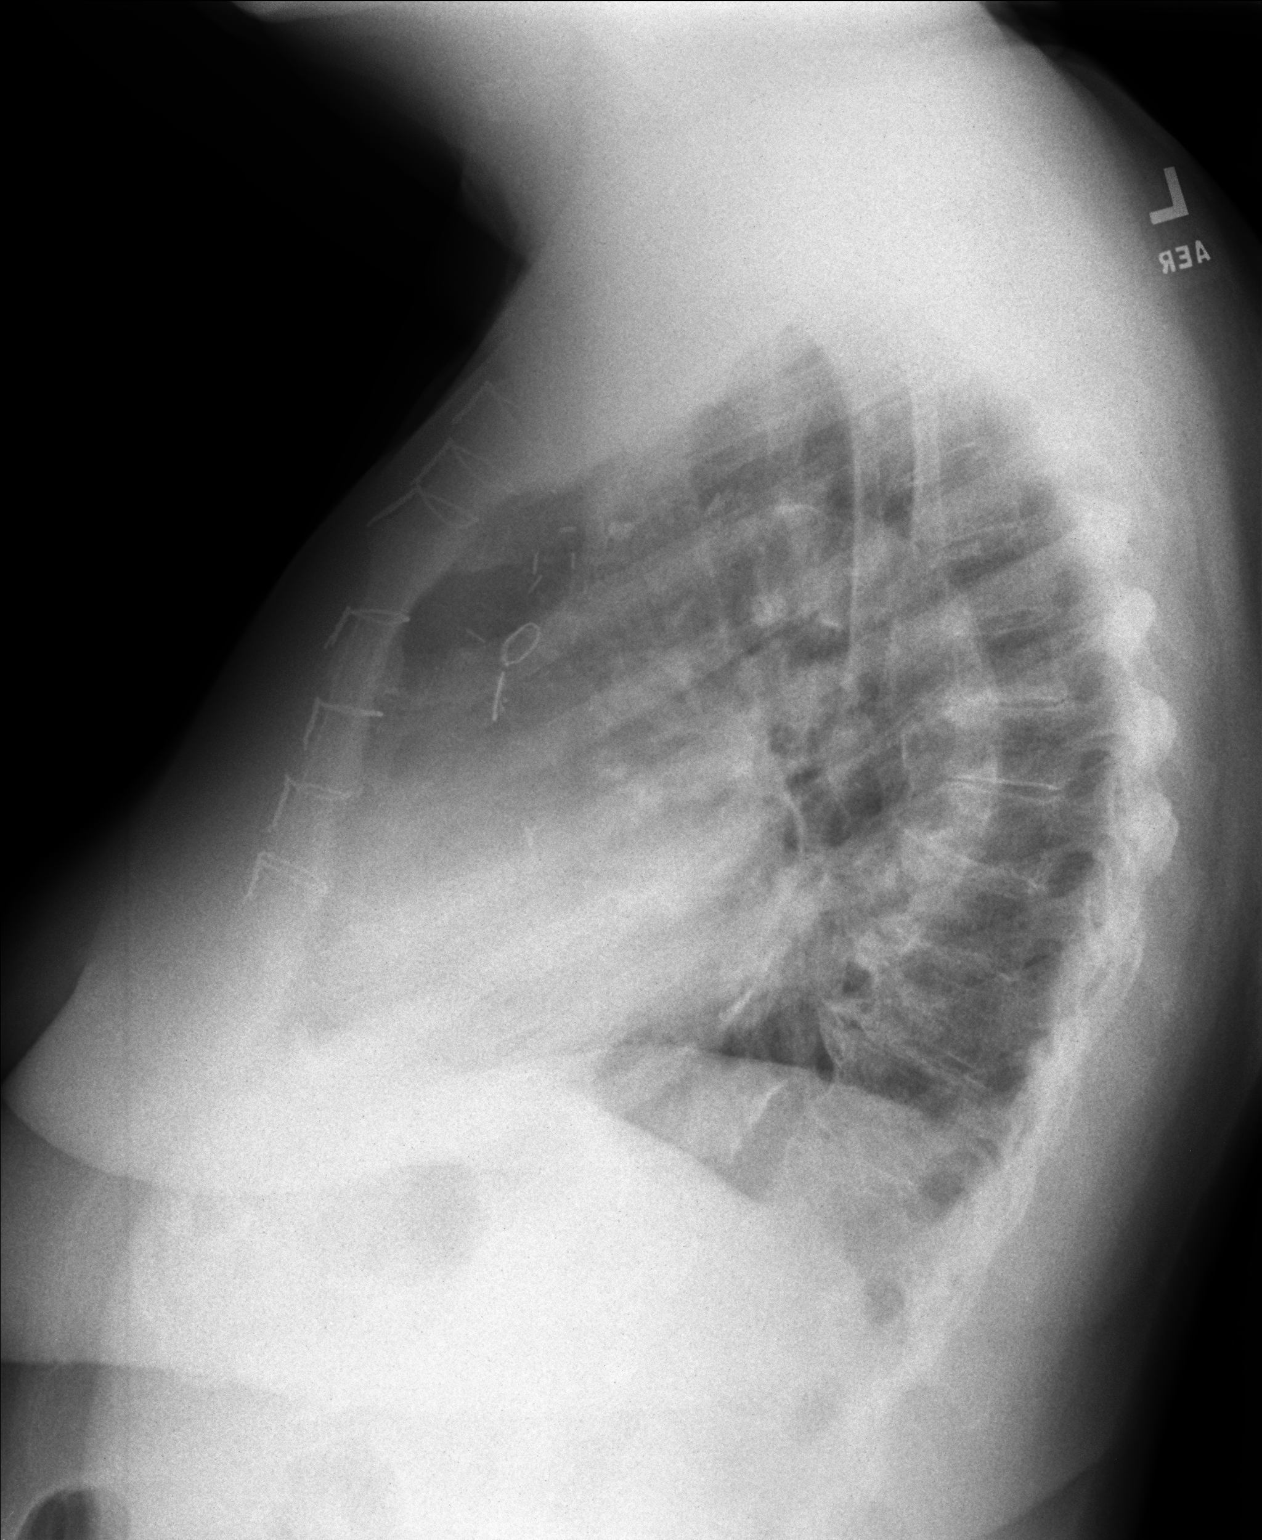

[2 of 2 positions shown; findings below may reference images not displayed]

FINDINGS: The heart size and mediastinal contours are within normal limits.
Both lungs are clear. The visualized skeletal structures are
unremarkable. Evidence of CABG reidentified. Atheromatous aortic
calcification noted without aneurysm. Minimal central bronchial wall
thickening is identified.
IMPRESSION: Minimal central bronchial wall thickening which could indicate
bronchitis.

## 2014-06-11 IMAGING — CR DG SINUSES 1-2V
1 series · 1 of 1 positions shown · non-contrast
Comparison: None.

CLINICAL DATA: Nasal congestion and cough for the past 4-5 days.

EXAM:
PARANASAL SINUSES - 1-2 VIEW

[waters]
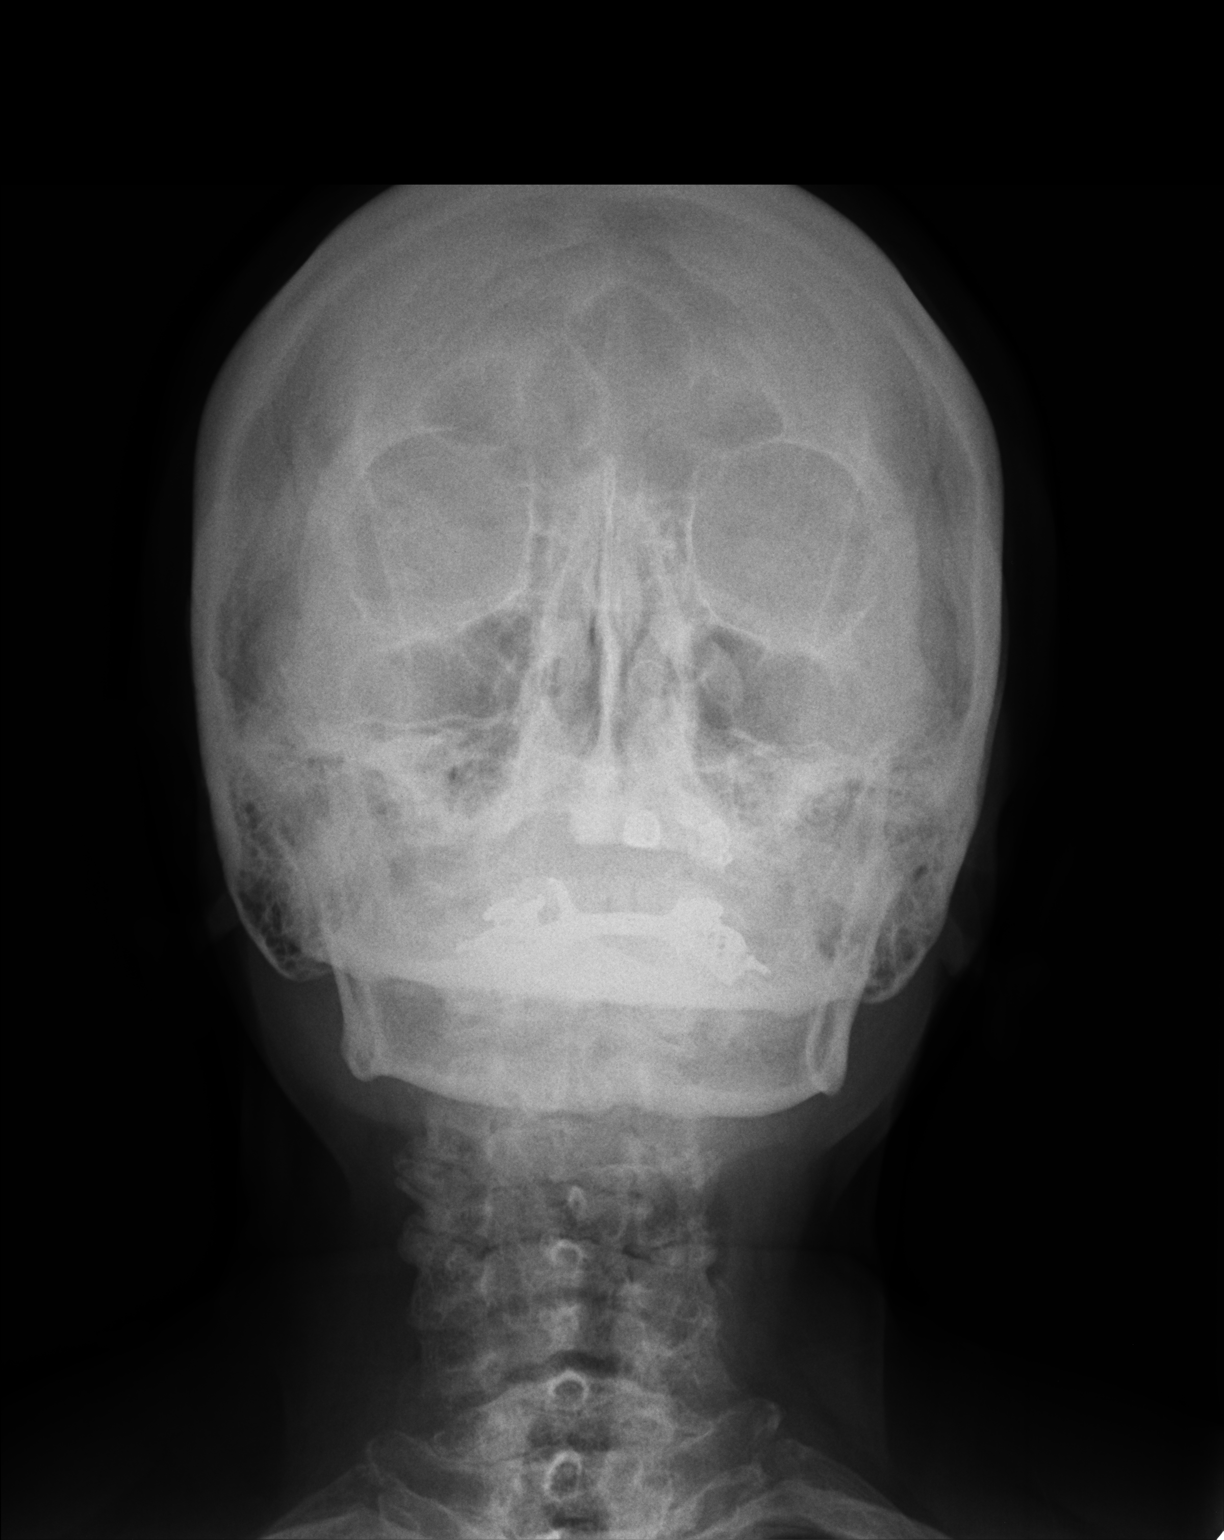

[1 of 1 positions shown; findings below may reference images not displayed]

FINDINGS: A single frontal view of the paranasal sinuses is submitted for
interpretation. The petrous bones are overlying the maxillary
sinuses. No gross mucosal thickening or air-fluid levels seen.
IMPRESSION: No plain radiographic evidence of sinusitis, limited by a suboptimal
positioning.

## 2014-06-11 MED ORDER — LORATADINE 10 MG PO TABS: 10.0000 mg | ORAL_TABLET | Freq: Every day | ORAL | Status: DC

## 2014-06-11 MED ORDER — HYDROCODONE-HOMATROPINE 5-1.5 MG/5ML PO SYRP: 2.5000 mL | ORAL_SOLUTION | Freq: Four times a day (QID) | ORAL | Status: DC | PRN

## 2014-06-11 MED ORDER — AZITHROMYCIN 250 MG PO TABS: 250.0000 mg | ORAL_TABLET | Freq: Every day | ORAL | Status: DC

## 2014-06-11 NOTE — Progress Notes (Signed)
06/11/2014 at 5:53 PM  [name] / DOB: [birth year] / MRN: [id number]  The patient has HYPERLIPIDEMIA-MIXED; HYPERTENSION, UNSPECIFIED; MYOCARDIAL INFARCTION, INFERIOR WALL, SUBSEQUENT CARE; CAD, UNSPECIFIED SITE; CAD, NATIVE VESSEL; CEREBROVASCULAR DISEASE; RHEUMATOID ARTHRITIS; History of coronary artery bypass graft; and Type 2 diabetes mellitus without complication on her problem list.  SUBJECTIVE  Chief complaint: Nasal Congestion and Cough  [name] is a 81 y.o. female complaining of URI symptoms that started 10 days ago.  Associated symptoms include runny nose, congestion and cough today, and she denies fever, sore throat and headache.The patient symptoms are worsening. Treatments tried thus far include cough suppressant of choice with poor relief. She denies sick contacts.   She  has a past medical history of Rheumatoid arthritis(714.0); Unspecified essential hypertension; Other and unspecified hyperlipidemia; Cerebrovascular disease, unspecified; Coronary atherosclerosis of unspecified type of vessel, native or graft; and Diabetes mellitus without complication.    Medications reviewed and updated by myself where necessary, and exist elsewhere in the encounter.   [name] has No Known Allergies. She  reports that she has never smoked. She has never used smokeless tobacco. She reports that she does not drink alcohol or use illicit drugs. She  has no sexual activity history on file. The patient  has past surgical history that includes Eye surgery and Cardiac Bypass.  Her family history includes Cancer in her brother and mother; Heart disease in her brother; Hypertension in her father and paternal grandmother. There is no history of Diabetes or Coronary artery disease.  Review of Systems  Constitutional: Negative for chills.  Respiratory: Negative for hemoptysis and wheezing.   Cardiovascular: Negative for chest pain.  Gastrointestinal: Negative for nausea.    Genitourinary: Negative for dysuria.  Musculoskeletal: Negative for myalgias.  Skin: Negative for rash.  Neurological: Negative for dizziness.    OBJECTIVE  Her  height is 5\' 3"  (1.6 m) and weight is 161 lb (73.029 kg). Her temperature is 98.5 F (36.9 C). Her blood pressure is 144/52 and her pulse is 77. Her respiration is 16 and oxygen saturation is 98%.  The patient's body mass index is 28.53 kg/(m^2).  Physical Exam  Constitutional: She is oriented to person, place, and time. She appears well-developed and well-nourished. No distress.  HENT:  Right Ear: Hearing, tympanic membrane, external ear and ear canal normal.  Left Ear: Hearing, tympanic membrane, external ear and ear canal normal.  Nose: No mucosal edema. Right sinus exhibits no maxillary sinus tenderness and no frontal sinus tenderness. Left sinus exhibits no maxillary sinus tenderness and no frontal sinus tenderness.  Mouth/Throat: Uvula is midline, oropharynx is clear and moist and mucous membranes are normal.  Cardiovascular: Normal rate, regular rhythm and normal heart sounds.   Respiratory: Effort normal and breath sounds normal. She has no wheezes. She has no rales.  Neurological: She is alert and oriented to person, place, and time.  Skin: Skin is warm and dry. She is not diaphoretic.  Psychiatric: She has a normal mood and affect.    Results for orders placed or performed in visit on 06/11/14 (from the past 24 hour(s))  POCT CBC     Status: Abnormal   Collection Time: 06/11/14 10:30 AM  Result Value Ref Range   WBC 4.6 4.6 - 10.2 K/uL   Lymph, poc 0.6 0.6 - 3.4   POC LYMPH PERCENT 12.8 10 - 50 %L   MID (cbc) 0.3 0 - 0.9   POC MID % 5.6 0 - 12 %M  POC Granulocyte 3.8 2 - 6.9   Granulocyte percent 81.6 (A) 37 - 80 %G   RBC 3.41 (A) 4.04 - 5.48 M/uL   Hemoglobin 10.6 (A) 12.2 - 16.2 g/dL   HCT, POC 33.2 (A) 37.7 - 47.9 %   MCV 97.4 (A) 80 - 97 fL   MCH, POC 31.2 27 - 31.2 pg   MCHC 32.1 31.8 - 35.4 g/dL    RDW, POC 13.7 %   Platelet Count, POC 181 142 - 424 K/uL   MPV 7.4 0 - 99.8 fL  POCT glycosylated hemoglobin (Hb A1C)     Status: None   Collection Time: 06/11/14 10:42 AM  Result Value Ref Range   Hemoglobin A1C 6.6    [facility] reading (PRIMARY) by  Dr. Elder Cyphers: Sinuses negative for air fluid levels.  Chest shows right perihilar infiltrate suspicious of pneumonia.   ASSESSMENT & PLAN  Coti was seen today for nasal congestion and cough.  Diagnoses and all orders for this visit:  Nasal congestion Orders: -     POCT CBC -     DG Chest 2 View; Future -     Cancel: DG Sinuses Complete; Future -     loratadine (CLARITIN) 10 MG tablet; Take 1 tablet (10 mg total) by mouth daily.  History of diabetes mellitus Orders: -     POCT glycosylated hemoglobin (Hb A1C) -     DG Chest 2 View; Future  Cough Orders: -     POCT CBC -     DG Chest 2 View; Future -     HYDROcodone-homatropine (HYCODAN) 5-1.5 MG/5ML syrup; Take 2.5 mLs by mouth every 6 (six) hours as needed for cough.  Walking pneumonia: Patient is elderly and has a history of DM2 further creating an immunocompromised status. Concern given questionable chest radiograph and increasing neutrophil count on CBC. Will treat.   Orders: -     azithromycin (ZITHROMAX Z-PAK) 250 MG tablet; Take 1 tablet (250 mg total) by mouth daily. Take two tabs on day one, and one tab daily thereafter. Take all of the medication.    The patient was advised to call or come back to clinic if she does not see an improvement in symptoms, or worsens with the above plan.   Philis Fendt, MHS, PA-C [facility] 06/11/2014 5:53 PM

## 2014-09-01 ENCOUNTER — Other Ambulatory Visit: Payer: Self-pay | Admitting: Cardiovascular Disease

## 2014-09-01 DIAGNOSIS — I6523 Occlusion and stenosis of bilateral carotid arteries: Secondary | ICD-10-CM

## 2014-09-14 ENCOUNTER — Ambulatory Visit (HOSPITAL_COMMUNITY): Payer: Medicare Other

## 2014-10-30 NOTE — Progress Notes (Addendum)
Patient ID: [name], female   DOB: 1927-10-27, 81 y.o.   MRN: [id number]     Cardiology Office Note   Date:  11/02/2014   ID:  [name], DOB Sep 20, 1927, MRN [id number]  PCP:  Foye Spurling, MD  Cardiologist:   Jenkins Rouge, MD   No chief complaint on file.     History of Present Illness: [name] is a 81 y.o. female who presents for evaluation of CAD.  Last seen by Dr Verl Blalock in 2012. Distant history of CABG complicated by CVA.  Last cath 06/2010 with patent grafts diffuse distal LAD disease In distal LAD.    SVG OM/D1 sequential SVG PDA LIMA to LAD  EF normal at that time    CRF;s HTN and elevated lipids on Rx  Has been on Aggrenox since stroke 09/08/13 reviewed duplex and had 123456 LICA stenosis and is due for repeat study. Living independently no angina , palpitations or syncope No dyspnea Ambulates with cain.  DM under good control     Past Medical History  Diagnosis Date  . Rheumatoid arthritis(714.0)   . Unspecified essential hypertension   . Other and unspecified hyperlipidemia   . Cerebrovascular disease, unspecified   . Coronary atherosclerosis of unspecified type of vessel, native or graft   . Diabetes mellitus without complication     Past Surgical History  Procedure Laterality Date  . Eye surgery    . Cardiac bypass       Current Outpatient Prescriptions  Medication Sig Dispense Refill  . acetaminophen (TYLENOL) 500 MG tablet Take 500 mg by mouth every 6 (six) hours as needed.      [name] allopurinol (ZYLOPRIM) 100 MG tablet Take 100 mg by mouth daily.    [name] amLODipine (NORVASC) 5 MG tablet Take 5 mg by mouth daily.      . Calcium Carb-Vit D-Soy Isoflav (CALTRATE 600 + SOY PO) Take by mouth.      . carvedilol (COREG) 12.5 MG tablet Take 12.5 mg by mouth 2 (two) times daily with a meal.      . Cholecalciferol (VITAMIN D3) 2000 UNITS capsule Take 2,000 Units by mouth daily.    [name] dipyridamole-aspirin (AGGRENOX) 25-200 MG per 12 hr capsule  Take 1 capsule by mouth 2 (two) times daily.      . fish oil-omega-3 fatty acids 1000 MG capsule Take 2 g by mouth daily.      . folic acid (FOLVITE) 1 MG tablet Take 1 mg by mouth daily.    [name] glimepiride (AMARYL) 2 MG tablet Take 2 mg by mouth daily before breakfast.      . glucosamine-chondroitin 500-400 MG tablet Take 1 tablet by mouth 3 (three) times daily.      [name] loratadine (CLARITIN) 10 MG tablet Take 1 tablet (10 mg total) by mouth daily. 30 tablet 11  . Multiple Vitamin (MULTIVITAMIN) capsule Take 1 capsule by mouth daily.      . nitroGLYCERIN (NITROSTAT) 0.4 MG SL tablet Place 1 tablet (0.4 mg total) under the tongue every 5 (five) minutes as needed. 25 tablet 2  . rosuvastatin (CRESTOR) 5 MG tablet Take 5 mg by mouth daily.      . valsartan-hydrochlorothiazide (DIOVAN-HCT) 160-12.5 MG per tablet Take 1 tablet by mouth daily.       No current facility-administered medications for this visit.    Allergies:   Review of patient's allergies indicates no known allergies.    Social History:  The patient  reports that she  has never smoked. She has never used smokeless tobacco. She reports that she does not drink alcohol or use illicit drugs.   Family History:  The patient's family history includes Cancer in her brother and mother; Heart disease in her brother; Hypertension in her father and paternal grandmother. There is no history of Diabetes or Coronary artery disease.    ROS:  Please see the history of present illness.   Otherwise, review of systems are positive for none.   All other systems are reviewed and negative.    PHYSICAL EXAM: VS:  BP 140/70 mmHg  Pulse 65  Ht 5\' 3"  (1.6 m)  Wt 73.573 kg (162 lb 3.2 oz)  BMI 28.74 kg/m2  SpO2 99% , BMI Body mass index is 28.74 kg/(m^2). Affect appropriate Healthy:  appears stated age 89: normal Neck supple with no adenopathy JVP normal no bruits no thyromegaly Lungs clear with no wheezing and good diaphragmatic motion Heart:   S1/S2 no murmur, no rub, gallop or click PMI normal Abdomen: benighn, BS positve, no tenderness, no AAA no bruit.  No HSM or HJR Distal pulses intact with no bruits No edema Neuro non-focal Skin warm and dry No muscular weakness    EKG:   10/25/13  SR rate 68 normal ECG  11/02/14  SR rate 65 nonspecific ST changes essentially normal    Recent Labs: 06/11/2014: Hemoglobin 10.6*    Lipid Panel No results found for: CHOL, TRIG, HDL, CHOLHDL, VLDL, LDLCALC, LDLDIRECT    Wt Readings from Last 3 Encounters:  11/02/14 73.573 kg (162 lb 3.2 oz)  06/11/14 73.029 kg (161 lb)  02/11/14 73.029 kg (161 lb)      Other studies Reviewed: Additional studies/ records that were reviewed today include: Epic notes and old CABG report .    ASSESSMENT AND PLAN:  1.  CAD/CABG: CABG 2003 with patent grafts on cath 2012 no angina continue medical Rx 2. Carotid:  F/u duplex 0000000 had 123456 LICA stenosis Previous stroke post CABG ASA 3. HTN:.  Well controlled.  Continue current medications and low sodium Dash type diet.   4. DM:  Discussed low carb diet.  Target hemoglobin A1c is 6.5 or less.  Continue current medications. 5. Chol on crestor labs with primary  Current medicines are reviewed at length with the patient today.  The patient does not have concerns regarding medicines.  The following changes have been made:  no change  Labs/ tests ordered today include:  Carotid Duplex   No orders of the defined types were placed in this encounter.     Disposition:   FU with me in a year      Signed, Jenkins Rouge, MD  11/02/2014 2:31 PM    [facility] [place], [place], [place]  [address] Phone: [phone]; Fax: [phone]

## 2014-11-02 ENCOUNTER — Encounter: Payer: Self-pay | Admitting: Cardiovascular Disease

## 2014-11-02 ENCOUNTER — Ambulatory Visit (INDEPENDENT_AMBULATORY_CARE_PROVIDER_SITE_OTHER): Payer: Medicare Other | Admitting: Cardiovascular Disease

## 2014-11-02 VITALS — BP 140/70 | HR 65 | Ht 63.0 in | Wt 162.2 lb

## 2014-11-02 DIAGNOSIS — I639 Cerebral infarction, unspecified: Secondary | ICD-10-CM

## 2014-11-02 DIAGNOSIS — I2510 Atherosclerotic heart disease of native coronary artery without angina pectoris: Secondary | ICD-10-CM | POA: Diagnosis not present

## 2014-11-02 NOTE — Patient Instructions (Signed)
Medication Instructions:  NONE  Labwork: NONE  Testing/Procedures: Your physician has requested that you have a carotid duplex. This test is an ultrasound of the carotid arteries in your neck. It looks at blood flow through these arteries that supply the brain with blood. Allow one hour for this exam. There are no restrictions or special instructions.    Follow-Up: Your physician wants you to follow-up in: [place] will receive a reminder letter in the mail two months in advance. If you don't receive a letter, please call our office to schedule the follow-up appointment.  Any Other Special Instructions Will Be Listed Below (If Applicable).

## 2014-11-20 ENCOUNTER — Ambulatory Visit (HOSPITAL_COMMUNITY)
Admission: RE | Admit: 2014-11-20 | Discharge: 2014-11-20 | Disposition: A | Discharge status: Home / Self Care | Payer: Medicare Other | Source: Ambulatory Visit | Attending: Cardiovascular Disease | Admitting: Cardiovascular Disease

## 2014-11-20 DIAGNOSIS — I639 Cerebral infarction, unspecified: Secondary | ICD-10-CM | POA: Insufficient documentation

## 2014-11-20 DIAGNOSIS — I6523 Occlusion and stenosis of bilateral carotid arteries: Secondary | ICD-10-CM | POA: Insufficient documentation

## 2014-11-20 DIAGNOSIS — I10 Essential (primary) hypertension: Secondary | ICD-10-CM | POA: Insufficient documentation

## 2014-11-20 DIAGNOSIS — I2510 Atherosclerotic heart disease of native coronary artery without angina pectoris: Secondary | ICD-10-CM | POA: Insufficient documentation

## 2015-05-10 ENCOUNTER — Ambulatory Visit (INDEPENDENT_AMBULATORY_CARE_PROVIDER_SITE_OTHER): Payer: Medicare Other | Admitting: Family Medicine

## 2015-05-10 VITALS — BP 132/80 | HR 71 | Temp 98.9°F | Resp 16 | Ht 64.0 in | Wt 159.0 lb

## 2015-05-10 DIAGNOSIS — J309 Allergic rhinitis, unspecified: Secondary | ICD-10-CM

## 2015-05-10 DIAGNOSIS — H5789 Other specified disorders of eye and adnexa: Secondary | ICD-10-CM

## 2015-05-10 DIAGNOSIS — R0981 Nasal congestion: Secondary | ICD-10-CM | POA: Diagnosis not present

## 2015-05-10 DIAGNOSIS — H578 Other specified disorders of eye and adnexa: Secondary | ICD-10-CM | POA: Diagnosis not present

## 2015-05-10 MED ORDER — LORATADINE 10 MG PO TABS: 10.0000 mg | ORAL_TABLET | Freq: Every day | ORAL | Status: DC

## 2015-05-10 MED ORDER — FLUTICASONE PROPIONATE 50 MCG/ACT NA SUSP: 2.0000 | Freq: Every day | NASAL | Status: DC

## 2015-05-10 NOTE — Progress Notes (Signed)
Subjective:    Patient ID: [name], female    DOB: 1927-07-02, 82 y.o.   MRN: [id number]  HPI This is a pleasant 82 yo female who presents today with nasal congestion for about a week. She has little nasal drainage. No sore throat. Her right eye has been red and irritated for 2 days. Sensitive to light. No visual changes, no watering/drainage. No fever. Occasional dry cough. No SOB, no wheeze. Has flonase at home, not using, not sure if it helped when she used it sporadically in the past.  Saw Dr. Carlis Abbott earlier this month for her diabetes- "everything good." Blood sugars not elevated with illness. This morning was 140.  She was seen this time last year with similar nasal congestion and pneumonia.  No change in activity level, drove herself this morning.    Past Medical History  Diagnosis Date  . Rheumatoid arthritis(714.0)   . Unspecified essential hypertension   . Other and unspecified hyperlipidemia   . Cerebrovascular disease, unspecified   . Coronary atherosclerosis of unspecified type of vessel, native or graft   . Diabetes mellitus without complication [facility])    Past Surgical History  Procedure Laterality Date  . Eye surgery    . Cardiac bypass     Family History  Problem Relation Age of Onset  . Diabetes Neg Hx   . Coronary artery disease Neg Hx   . Cancer Mother   . Hypertension Father   . Cancer Brother   . Hypertension Paternal Grandmother   . Heart disease Brother    Social History  Substance Use Topics  . Smoking status: Never Smoker   . Smokeless tobacco: Never Used  . Alcohol Use: No      Review of Systems  Constitutional: Negative for fever, chills, activity change, appetite change and fatigue.  HENT: Positive for congestion, rhinorrhea and sinus pressure. Negative for ear pain and sore throat.   Respiratory: Positive for cough (occasional, dry). Negative for shortness of breath and wheezing.   Cardiovascular: Negative for chest pain.         Objective:   Physical Exam  Constitutional: She is oriented to person, place, and time. She appears well-developed and well-nourished. No distress.  HENT:  Head: Normocephalic and atraumatic.  Right Ear: Tympanic membrane, external ear and ear canal normal.  Left Ear: Tympanic membrane, external ear and ear canal normal.  Nose: Mucosal edema and rhinorrhea present.  Mouth/Throat: Uvula is midline and oropharynx is clear and moist.  Eyes: Conjunctivae, EOM and lids are normal. Pupils are equal, round, and reactive to light. Right eye exhibits no discharge and no exudate. Left eye exhibits no discharge and no exudate. Right conjunctiva is not injected. Left conjunctiva is not injected.  Cardiovascular: Normal rate, regular rhythm and normal heart sounds.   Pulmonary/Chest: Effort normal and breath sounds normal.  Neurological: She is alert and oriented to person, place, and time.  Skin: Skin is warm and dry. She is not diaphoretic.  Psychiatric: She has a normal mood and affect. Her behavior is normal. Judgment and thought content normal.  Vitals reviewed.     BP 132/80 mmHg  Pulse 71  Temp(Src) 98.9 F (37.2 C) (Oral)  Resp 16  Ht 5\' 4"  (1.626 m)  Wt 159 lb (72.122 kg)  BMI 27.28 kg/m2  SpO2 96% Wt Readings from Last 3 Encounters:  05/10/15 159 lb (72.122 kg)  11/02/14 162 lb 3.2 oz (73.573 kg)  06/11/14 161 lb (73.029 kg)  Assessment & Plan:  1. Allergic rhinitis, unspecified allergic rhinitis type - loratadine (CLARITIN) 10 MG tablet; Take 1 tablet (10 mg total) by mouth daily.  Dispense: 30 tablet; Refill: 11 - fluticasone (FLONASE) 50 MCG/ACT nasal spray; Place 2 sprays into both nostrils daily.  Dispense: 16 g; Refill: 6  2. Nasal congestion - loratadine (CLARITIN) 10 MG tablet; Take 1 tablet (10 mg total) by mouth daily.  Dispense: 30 tablet; Refill: 11 - fluticasone (FLONASE) 50 MCG/ACT nasal spray; Place 2 sprays into both nostrils daily.  Dispense: 16 g;  Refill: 6  3. Redness of eye, right - no abnormal findings on exam, suggested artificial tears for dryness, RTC if any visual changes, pain, drainage  - RTC if any fever/chills/sob/wheeze   Clarene Reamer, FNP-BC  [facility]  05/10/2015 12:12 PM

## 2015-05-10 NOTE — Patient Instructions (Addendum)
Please take the antihistamine Claritin every day for at least 2 week and use nasal spray every night for 3-4 weeks Then you can use these medications as needed Can also use saline nasal spray as needed     IF you received an x-ray today, you will receive an invoice from [facility]. Please contact [facility] at [phone] with questions or concerns regarding your invoice.   IF you received labwork today, you will receive an invoice from [employer]. Please contact [facility] at [phone] with questions or concerns regarding your invoice.   Our billing staff will not be able to assist you with questions regarding bills from these companies.  You will be contacted with the lab results as soon as they are available. The fastest way to get your results is to activate your My Chart account. Instructions are located on the last page of this paperwork. If you have not heard from [place] regarding the results in 2 weeks, please contact this office.

## 2015-11-08 NOTE — Progress Notes (Signed)
Patient ID: [name], female   DOB: 01-11-1928, 81 y.o.   MRN: [id number]     Cardiology Office Note   Date:  11/15/2015   ID:  [name], DOB 02-09-1928, MRN [id number]  PCP:  Foye Spurling, MD  Cardiologist:   Jenkins Rouge, MD   Chief Complaint  Patient presents with  . Coronary artery disease involving native coronary artery of       History of Present Illness: [name] is a 82 y.o. female who presents for evaluation of CAD.  Last seen by Dr Verl Blalock in 2012. Distant history of CABG complicated by CVA.  Last cath 06/2010 with patent grafts diffuse distal LAD disease In distal LAD.    SVG OM/D1 sequential SVG PDA LIMA to LAD  EF normal at that time    CRF;s HTN and elevated lipids on Rx  Has been on Aggrenox since stroke 09/08/13 reviewed duplex and had 63-78% LICA stenosis and is due for repeat study. Living independently no angina , palpitations or syncope No dyspnea Ambulates with cain.  DM under good control     Past Medical History:  Diagnosis Date  . Cerebrovascular disease, unspecified   . Coronary atherosclerosis of unspecified type of vessel, native or graft   . Diabetes mellitus without complication ([place])   . Other and unspecified hyperlipidemia   . Rheumatoid arthritis(714.0)   . Unspecified essential hypertension     Past Surgical History:  Procedure Laterality Date  . Cardiac Bypass    . EYE SURGERY       Current Outpatient Prescriptions  Medication Sig Dispense Refill  . acetaminophen (TYLENOL) 500 MG tablet Take 500 mg by mouth every 6 (six) hours as needed.      [name] allopurinol (ZYLOPRIM) 100 MG tablet Take 100 mg by mouth daily.    [name] amLODipine (NORVASC) 5 MG tablet Take 5 mg by mouth daily.      . Calcium Carb-Vit D-Soy Isoflav (CALTRATE 600 + SOY PO) Take by mouth.      . carvedilol (COREG) 12.5 MG tablet Take 12.5 mg by mouth 2 (two) times daily with a meal.      . Cholecalciferol (VITAMIN D3) 2000 UNITS capsule Take 2,000 Units  by mouth daily.    [name] dipyridamole-aspirin (AGGRENOX) 25-200 MG per 12 hr capsule Take 1 capsule by mouth 2 (two) times daily.      . fish oil-omega-3 fatty acids 1000 MG capsule Take 2 g by mouth daily.      . fluticasone (FLONASE) 50 MCG/ACT nasal spray Place 2 sprays into both nostrils daily. 16 g 6  . glimepiride (AMARYL) 2 MG tablet Take 2 mg by mouth daily before breakfast.      . glucosamine-chondroitin 500-400 MG tablet Take 1 tablet by mouth 3 (three) times daily.      [name] loratadine (CLARITIN) 10 MG tablet Take 1 tablet (10 mg total) by mouth daily. 30 tablet 11  . Multiple Vitamin (MULTIVITAMIN) capsule Take 1 capsule by mouth daily.      . nitroGLYCERIN (NITROSTAT) 0.4 MG SL tablet Place 1 tablet (0.4 mg total) under the tongue every 5 (five) minutes as needed. 25 tablet 2  . rosuvastatin (CRESTOR) 5 MG tablet Take 5 mg by mouth daily.      . valsartan-hydrochlorothiazide (DIOVAN-HCT) 160-12.5 MG per tablet Take 1 tablet by mouth daily.       No current facility-administered medications for this visit.     Allergies:   Review of patient's  allergies indicates no known allergies.    Social History:  The patient  reports that she has never smoked. She has never used smokeless tobacco. She reports that she does not drink alcohol or use drugs.   Family History:  The patient's family history includes Cancer in her brother and mother; Heart disease in her brother; Hypertension in her father and paternal grandmother.    ROS:  Please see the history of present illness.   Otherwise, review of systems are positive for none.   All other systems are reviewed and negative.    PHYSICAL EXAM: VS:  BP (!) 160/70   Pulse 67   Ht 5\' 4"  (1.626 m)   Wt 73.6 kg (162 lb 3.2 oz)   SpO2 98%   BMI 27.84 kg/m  , BMI Body mass index is 27.84 kg/m. Affect appropriate Healthy:  appears stated age 89: normal Neck supple with no adenopathy JVP normal no bruits no thyromegaly Lungs clear with no  wheezing and good diaphragmatic motion Heart:  S1/S2 no murmur, no rub, gallop or click PMI normal Abdomen: benighn, BS positve, no tenderness, no AAA no bruit.  No HSM or HJR Distal pulses intact with no bruits No edema Neuro non-focal Skin warm and dry No muscular weakness    EKG:   10/25/13  SR rate 68 normal ECG  11/02/14  SR rate 65 nonspecific ST changes essentially normal    Recent Labs: No results found for requested labs within last 8760 hours.    Lipid Panel No results found for: CHOL, TRIG, HDL, CHOLHDL, VLDL, LDLCALC, LDLDIRECT    Wt Readings from Last 3 Encounters:  11/15/15 73.6 kg (162 lb 3.2 oz)  05/10/15 72.1 kg (159 lb)  11/02/14 73.6 kg (162 lb 3.2 oz)      Other studies Reviewed: Additional studies/ records that were reviewed today include: Epic notes and old CABG report .    ASSESSMENT AND PLAN:  1.  CAD/CABG: CABG 2003 with patent grafts on cath 2012 no angina continue medical Rx 2. Carotid:  F/u duplex ordered-  02/6107 had 60-45% LICA stenosis Previous stroke post CABG ASA 3. HTN:.  Well controlled.  Continue current medications and low sodium Dash type diet.   4. DM:  Discussed low carb diet.  Target hemoglobin A1c is 6.5 or less.  Continue current medications. 5. Chol on crestor labs with primary  Current medicines are reviewed at length with the patient today.  The patient does not have concerns regarding medicines.  The following changes have been made:  no change  Labs/ tests ordered today include:   Carotid duplex     Orders Placed This Encounter  Procedures  . EKG 12-Lead     Disposition:   FU with me in a year      Signed, Jenkins Rouge, MD  11/15/2015 10:02 AM    [facility] [place], [place], [place]  [address] Phone: [phone]; Fax: [phone]

## 2015-11-15 ENCOUNTER — Encounter: Payer: Self-pay | Admitting: Cardiovascular Disease

## 2015-11-15 ENCOUNTER — Encounter (INDEPENDENT_AMBULATORY_CARE_PROVIDER_SITE_OTHER): Payer: Self-pay

## 2015-11-15 ENCOUNTER — Ambulatory Visit (INDEPENDENT_AMBULATORY_CARE_PROVIDER_SITE_OTHER): Payer: Medicare Other | Admitting: Cardiovascular Disease

## 2015-11-15 VITALS — BP 150/70 | HR 67 | Ht 64.0 in | Wt 162.2 lb

## 2015-11-15 DIAGNOSIS — I2510 Atherosclerotic heart disease of native coronary artery without angina pectoris: Secondary | ICD-10-CM | POA: Diagnosis not present

## 2015-11-15 NOTE — Patient Instructions (Addendum)

## 2015-11-29 ENCOUNTER — Ambulatory Visit (HOSPITAL_COMMUNITY)
Admission: RE | Admit: 2015-11-29 | Discharge: 2015-11-29 | Disposition: A | Discharge status: Home / Self Care | Payer: Medicare Other | Source: Ambulatory Visit | Attending: Cardiovascular Disease | Admitting: Cardiovascular Disease

## 2015-11-29 DIAGNOSIS — I10 Essential (primary) hypertension: Secondary | ICD-10-CM | POA: Diagnosis not present

## 2015-11-29 DIAGNOSIS — I6523 Occlusion and stenosis of bilateral carotid arteries: Secondary | ICD-10-CM | POA: Insufficient documentation

## 2015-11-29 DIAGNOSIS — E785 Hyperlipidemia, unspecified: Secondary | ICD-10-CM | POA: Diagnosis not present

## 2015-11-29 DIAGNOSIS — Z8673 Personal history of transient ischemic attack (TIA), and cerebral infarction without residual deficits: Secondary | ICD-10-CM | POA: Insufficient documentation

## 2015-11-29 DIAGNOSIS — Z951 Presence of aortocoronary bypass graft: Secondary | ICD-10-CM | POA: Insufficient documentation

## 2015-11-29 DIAGNOSIS — I2510 Atherosclerotic heart disease of native coronary artery without angina pectoris: Secondary | ICD-10-CM | POA: Diagnosis not present

## 2015-11-29 DIAGNOSIS — E119 Type 2 diabetes mellitus without complications: Secondary | ICD-10-CM | POA: Insufficient documentation

## 2015-12-27 ENCOUNTER — Encounter: Payer: Self-pay | Admitting: Cardiovascular Disease

## 2016-01-14 ENCOUNTER — Telehealth: Payer: Self-pay | Admitting: Cardiovascular Disease

## 2016-01-14 NOTE — Telephone Encounter (Signed)
New message ° °Pt is returning call  ° °Please call back °

## 2016-01-14 NOTE — Telephone Encounter (Signed)
Returned patient's call about lab work. Patient denies that she had lab work this month. Patient had lab work done by her PCP Dr. Carlis Abbott. Called Dr. Ainsley Spinner office, his office stated patient had lab work done day of her office visit with Dr. Carlis Abbott. Clarified with Dr. Ainsley Spinner office that lab work was patient's labs.

## 2016-12-01 ENCOUNTER — Telehealth: Payer: Self-pay

## 2016-12-01 DIAGNOSIS — I2510 Atherosclerotic heart disease of native coronary artery without angina pectoris: Secondary | ICD-10-CM

## 2016-12-01 NOTE — Telephone Encounter (Signed)
-----   Message from Michaelyn Barter, RN sent at 11/30/2015  5:12 PM EDT ----- Regarding: carotid f/u 1 year Need carotid

## 2016-12-01 NOTE — Telephone Encounter (Signed)
Ordered carotid for patient to have done before office visit at the end of the month. Sent message to [facility] to schedule.

## 2016-12-04 ENCOUNTER — Other Ambulatory Visit: Payer: Self-pay | Admitting: Cardiovascular Disease

## 2016-12-04 ENCOUNTER — Ambulatory Visit (HOSPITAL_COMMUNITY)
Admission: RE | Admit: 2016-12-04 | Discharge: 2016-12-04 | Disposition: A | Discharge status: Home / Self Care | Payer: Medicare Other | Source: Ambulatory Visit | Attending: Cardiology | Admitting: Cardiology

## 2016-12-04 DIAGNOSIS — I6523 Occlusion and stenosis of bilateral carotid arteries: Secondary | ICD-10-CM | POA: Diagnosis not present

## 2016-12-04 DIAGNOSIS — I2510 Atherosclerotic heart disease of native coronary artery without angina pectoris: Secondary | ICD-10-CM

## 2016-12-09 ENCOUNTER — Telehealth: Payer: Self-pay

## 2016-12-09 DIAGNOSIS — I2510 Atherosclerotic heart disease of native coronary artery without angina pectoris: Secondary | ICD-10-CM

## 2016-12-09 NOTE — Telephone Encounter (Signed)
-----   Message from Josue Hector, MD sent at 12/08/2016  5:33 PM EDT ----- Plaque no bad stenosis f/u in 2 years

## 2016-12-09 NOTE — Telephone Encounter (Signed)
Patient aware of carotid results. Order in for 2 year f/u

## 2016-12-16 NOTE — Progress Notes (Signed)
Patient ID: [name], female   DOB: [birth year], 83 y.o.   MRN: [id number]     Cardiology Office Note   Date:  12/17/2016   ID:  Aunna Lykins, DOB 1927-12-28, MRN [id number]  PCP:  Glendale Chard, MD  Cardiologist:   Jenkins Rouge, MD   No chief complaint on file.     History of Present Illness: [name] is a 83 y.o. female who presents for evaluation of CAD.  Seen by Dr Verl Blalock in 2012. Distant history of CABG complicated by CVA.  Last cath 06/2010 with patent grafts diffuse distal LAD disease In distal LAD.    SVG OM/D1 sequential SVG PDA LIMA to LAD  EF normal at that time    CRF;s HTN and elevated lipids on Rx  Has been on Aggrenox since stroke 09/08/13 Carotid 11/2016 plaque no stenosis  Living independently no angina , palpitations or syncope No dyspnea Ambulates with cain.  DM under good control  No chest pain Still driving Dr Carlis Abbott to retire she will see Dr Baird Cancer      Past Medical History:  Diagnosis Date  . Cerebrovascular disease, unspecified   . Coronary atherosclerosis of unspecified type of vessel, native or graft   . Diabetes mellitus without complication ([place])   . Other and unspecified hyperlipidemia   . Rheumatoid arthritis(714.0)   . Unspecified essential hypertension     Past Surgical History:  Procedure Laterality Date  . Cardiac Bypass    . EYE SURGERY       Current Outpatient Prescriptions  Medication Sig Dispense Refill  . acetaminophen (TYLENOL) 500 MG tablet Take 500 mg by mouth every 6 (six) hours as needed.      [name] allopurinol (ZYLOPRIM) 100 MG tablet Take 100 mg by mouth daily.    [name] amLODipine (NORVASC) 5 MG tablet Take 5 mg by mouth daily.      . Calcium Carb-Vit D-Soy Isoflav (CALTRATE 600 + SOY PO) Take by mouth.      . carvedilol (COREG) 12.5 MG tablet Take 12.5 mg by mouth 2 (two) times daily with a meal.      . Cholecalciferol (VITAMIN D3) 2000 UNITS capsule Take 2,000 Units by mouth daily.    [name] dipyridamole-aspirin  (AGGRENOX) 25-200 MG per 12 hr capsule Take 1 capsule by mouth 2 (two) times daily.      . fish oil-omega-3 fatty acids 1000 MG capsule Take 2 g by mouth daily.      . fluticasone (FLONASE) 50 MCG/ACT nasal spray Place 2 sprays into both nostrils daily. 16 g 6  . glimepiride (AMARYL) 2 MG tablet Take 2 mg by mouth daily before breakfast.      . glucosamine-chondroitin 500-400 MG tablet Take 1 tablet by mouth 3 (three) times daily.      . irbesartan-hydrochlorothiazide (AVALIDE) 150-12.5 MG tablet Take 1 tablet by mouth daily.  0  . loratadine (CLARITIN) 10 MG tablet Take 1 tablet (10 mg total) by mouth daily. 30 tablet 11  . Multiple Vitamin (MULTIVITAMIN) capsule Take 1 capsule by mouth daily.      . nitroGLYCERIN (NITROSTAT) 0.4 MG SL tablet Place 1 tablet (0.4 mg total) under the tongue every 5 (five) minutes as needed. 25 tablet 2  . rosuvastatin (CRESTOR) 5 MG tablet Take 5 mg by mouth daily.       No current facility-administered medications for this visit.     Allergies:   Patient has no known allergies.    Social History:  The patient  reports that she has never smoked. She has never used smokeless tobacco. She reports that she does not drink alcohol or use drugs.   Family History:  The patient's family history includes Cancer in her brother and mother; Heart disease in her brother; Hypertension in her father and paternal grandmother.    ROS:  Please see the history of present illness.   Otherwise, review of systems are positive for none.   All other systems are reviewed and negative.    PHYSICAL EXAM: VS:  BP (!) 162/70   Pulse 66   Ht 5\' 4"  (1.626 m)   Wt 152 lb 1.9 oz (69 kg)   SpO2 99%   BMI 26.11 kg/m  , BMI Body mass index is 26.11 kg/m. Affect appropriate Healthy:  appears stated age 43: normal Neck supple with no adenopathy JVP normal no bruits no thyromegaly Lungs clear with no wheezing and good diaphragmatic motion Heart:  S1/S2 no murmur, no rub, gallop  or click PMI normal Abdomen: benighn, BS positve, no tenderness, no AAA no bruit.  No HSM or HJR Distal pulses intact with no bruits No edema Neuro non-focal Skin warm and dry No muscular weakness    EKG:   10/25/13  SR rate 68 normal ECG  11/02/14  SR rate 65 nonspecific ST changes essentially normal  12/17/16 SR rate 66 nonspecific ST changes   Recent Labs: No results found for requested labs within last 8760 hours.    Lipid Panel No results found for: CHOL, TRIG, HDL, CHOLHDL, VLDL, LDLCALC, LDLDIRECT    Wt Readings from Last 3 Encounters:  12/17/16 152 lb 1.9 oz (69 kg)  11/15/15 162 lb 3.2 oz (73.6 kg)  05/10/15 159 lb (72.1 kg)      Other studies Reviewed: Additional studies/ records that were reviewed today include: Epic notes and old CABG report .    ASSESSMENT AND PLAN:  1.  CAD/CABG: CABG 2003 with patent grafts on cath 2012 no angina continue medical Rx 2. Carotid:  Duplex 12/04/16 plaque no stenosis f/u duplex October 2020 3. HTN:.  Well controlled.  Continue current medications and low sodium Dash type diet.   4. DM:  Discussed low carb diet.  Target hemoglobin A1c is 6.5 or less.  Continue current medications. 5. Chol on crestor labs with primary  Current medicines are reviewed at length with the patient today.  The patient does not have concerns regarding medicines.  The following changes have been made:  no change  Labs/ tests ordered today include:   None     Orders Placed This Encounter  Procedures  . EKG 12-Lead     Disposition:   FU with me in a year      Signed, Jenkins Rouge, MD  12/17/2016 9:30 AM    [address], [place], [place]  [address] Phone: [phone]; Fax: [phone]

## 2016-12-17 ENCOUNTER — Ambulatory Visit (INDEPENDENT_AMBULATORY_CARE_PROVIDER_SITE_OTHER): Payer: Medicare Other | Admitting: Cardiovascular Disease

## 2016-12-17 ENCOUNTER — Encounter: Payer: Self-pay | Admitting: Cardiovascular Disease

## 2016-12-17 VITALS — BP 162/70 | HR 66 | Ht 64.0 in | Wt 152.1 lb

## 2016-12-17 DIAGNOSIS — I2510 Atherosclerotic heart disease of native coronary artery without angina pectoris: Secondary | ICD-10-CM | POA: Diagnosis not present

## 2016-12-17 DIAGNOSIS — I10 Essential (primary) hypertension: Secondary | ICD-10-CM | POA: Diagnosis not present

## 2016-12-17 NOTE — Patient Instructions (Addendum)

## 2017-06-11 LAB — HEPATIC FUNCTION PANEL
ALK PHOS: 80 (ref 25–125)
ALT: 13 (ref 7–35)
AST: 19 (ref 13–35)

## 2017-06-11 LAB — HEMOGLOBIN A1C: HEMOGLOBIN A1C: 6.7 % — AB (ref 4.0–5.6)

## 2017-06-11 LAB — BASIC METABOLIC PANEL
BUN: 43 — AB (ref 4–21)
Creatinine: 1.7 — AB (ref 0.5–1.1)
Glucose: 91
Potassium: 5.2 (ref 3.4–5.3)
SODIUM: 143 (ref 137–147)

## 2017-11-20 ENCOUNTER — Other Ambulatory Visit: Payer: Self-pay | Admitting: Nurse Practitioner

## 2017-12-02 ENCOUNTER — Other Ambulatory Visit: Payer: Self-pay | Admitting: Cardiovascular Disease

## 2017-12-02 DIAGNOSIS — I679 Cerebrovascular disease, unspecified: Secondary | ICD-10-CM

## 2017-12-07 ENCOUNTER — Encounter: Payer: Self-pay | Admitting: Internal Medicine

## 2017-12-07 DIAGNOSIS — M109 Gout, unspecified: Secondary | ICD-10-CM | POA: Insufficient documentation

## 2017-12-08 ENCOUNTER — Telehealth: Payer: Self-pay

## 2017-12-08 ENCOUNTER — Ambulatory Visit (HOSPITAL_COMMUNITY)
Admission: RE | Admit: 2017-12-08 | Discharge: 2017-12-08 | Disposition: A | Discharge status: Home / Self Care | Payer: Medicare Other | Source: Ambulatory Visit | Attending: Cardiology | Admitting: Cardiology

## 2017-12-08 DIAGNOSIS — I679 Cerebrovascular disease, unspecified: Secondary | ICD-10-CM | POA: Diagnosis present

## 2017-12-08 DIAGNOSIS — I2510 Atherosclerotic heart disease of native coronary artery without angina pectoris: Secondary | ICD-10-CM

## 2017-12-08 NOTE — Telephone Encounter (Signed)
Patient aware of carotid. Per Dr. Johnsie Cancel, Plaque no stenosis f/u carotid duplex in 2 years. Patient verbalized understanding. Placed order to have carotid duplex in 2 years.

## 2017-12-08 NOTE — Telephone Encounter (Signed)
-----   Message from Josue Hector, MD sent at 12/08/2017  1:17 PM EDT ----- Plaque no stenosis f/u carotid duplex in 2 years

## 2017-12-10 ENCOUNTER — Encounter: Payer: Self-pay | Admitting: Nurse Practitioner

## 2017-12-10 ENCOUNTER — Ambulatory Visit: Payer: Medicare Other | Admitting: Nurse Practitioner

## 2017-12-10 VITALS — BP 140/80 | HR 64 | Temp 98.1°F | Ht 64.0 in | Wt 151.0 lb

## 2017-12-10 DIAGNOSIS — E119 Type 2 diabetes mellitus without complications: Secondary | ICD-10-CM

## 2017-12-10 DIAGNOSIS — I10 Essential (primary) hypertension: Secondary | ICD-10-CM

## 2017-12-10 NOTE — Progress Notes (Signed)
Subjective:     Patient ID: [name] , female    DOB: 1927-05-29 , 84 y.o.   MRN: [id number]   Chief Complaint  Patient presents with  . Diabetes    Diabetes  She presents for her follow-up diabetic visit. She has type 2 diabetes mellitus. Her disease course has been stable. Pertinent negatives for hypoglycemia include no dizziness. Pertinent negatives for diabetes include no fatigue, no polydipsia, no polyphagia and no polyuria. Symptoms are stable. Current diabetic treatment includes oral agent (monotherapy). She is following a generally healthy diet. When asked about meal planning, she reported none. She has not had a previous visit with a dietitian. She participates in exercise intermittently. Her overall blood glucose range is 90-110 mg/dl. An ACE inhibitor/angiotensin II receptor blocker is being taken.     Past Medical History:  Diagnosis Date  . Cerebrovascular disease, unspecified   . Coronary atherosclerosis of unspecified type of vessel, native or graft   . Diabetes mellitus without complication ([place])   . Other and unspecified hyperlipidemia   . Rheumatoid arthritis(714.0)   . Unspecified essential hypertension       Current Outpatient Medications:  .  acetaminophen (TYLENOL) 500 MG tablet, Take 500 mg by mouth every 6 (six) hours as needed.  , Disp: , Rfl:  .  allopurinol (ZYLOPRIM) 100 MG tablet, Take 100 mg by mouth daily., Disp: , Rfl:  .  amLODipine (NORVASC) 5 MG tablet, Take 5 mg by mouth daily.  , Disp: , Rfl:  .  Calcium Carb-Vit D-Soy Isoflav (CALTRATE 600 + SOY PO), Take by mouth.  , Disp: , Rfl:  .  carvedilol (COREG) 12.5 MG tablet, TAKE 1 TABLET BY MOUTH TWICE DAILY WITH FOOD, Disp: 90 tablet, Rfl: 0 .  Cholecalciferol (VITAMIN D3) 2000 UNITS capsule, Take 2,000 Units by mouth daily., Disp: , Rfl:  .  dipyridamole-aspirin (AGGRENOX) 200-25 MG 12hr capsule, Take by mouth., Disp: , Rfl:  .  dipyridamole-aspirin (AGGRENOX) 25-200 MG per 12 hr capsule,  Take 1 capsule by mouth 2 (two) times daily.  , Disp: , Rfl:  .  fish oil-omega-3 fatty acids 1000 MG capsule, Take 2 g by mouth daily.  , Disp: , Rfl:  .  fluticasone (FLONASE) 50 MCG/ACT nasal spray, Place 2 sprays into both nostrils daily., Disp: 16 g, Rfl: 6 .  glimepiride (AMARYL) 1 MG tablet, TAKE 1 TABLET BY MOUTH EVERY DAY, Disp: 90 tablet, Rfl: 0 .  glucosamine-chondroitin 500-400 MG tablet, Take 1 tablet by mouth 3 (three) times daily.  , Disp: , Rfl:  .  irbesartan-hydrochlorothiazide (AVALIDE) 150-12.5 MG tablet, TAKE 1 TABLET BY MOUTH EVERY DAY, Disp: 90 tablet, Rfl: 0 .  loratadine (CLARITIN) 10 MG tablet, Take 1 tablet (10 mg total) by mouth daily., Disp: 30 tablet, Rfl: 11 .  Multiple Vitamin (MULTIVITAMIN) capsule, Take 1 capsule by mouth daily.  , Disp: , Rfl:  .  nitroGLYCERIN (NITROSTAT) 0.4 MG SL tablet, Place 1 tablet (0.4 mg total) under the tongue every 5 (five) minutes as needed., Disp: 25 tablet, Rfl: 2 .  rosuvastatin (CRESTOR) 5 MG tablet, Take 5 mg by mouth daily.  , Disp: , Rfl:    No Known Allergies   Review of Systems  Constitutional: Negative for fatigue.  Endocrine: Negative for polydipsia, polyphagia and polyuria.  Neurological: Negative for dizziness.     Today's Vitals   12/10/17 1147  BP: 140/80  Pulse: 64  Temp: 98.1 F (36.7 C)  TempSrc: Oral  SpO2: 97%  Weight: 151 lb (68.5 kg)  Height: 5\' 4"  (1.626 m)   Body mass index is 25.92 kg/m.   Objective:  Physical Exam  Constitutional: She is oriented to person, place, and time. She appears well-developed and well-nourished.  Cardiovascular: Normal rate, regular rhythm, normal heart sounds and intact distal pulses.  Pulmonary/Chest: Effort normal and breath sounds normal.  Neurological: She is alert and oriented to person, place, and time.  Skin: Skin is warm and dry.        Assessment And Plan:    1. Type 2 diabetes mellitus without complication, without long-term current use of insulin  (HCC)  Chronic, controlled  Continue with current medications - Hemoglobin A1c - BMP8+Anion Gap  2. Essential hypertension  Chronic, controlled  Continue with current medications - BMP8+Anion Gap       Minette Brine, FNP

## 2017-12-11 LAB — BMP8+ANION GAP
Anion Gap: 16 mmol/L (ref 10.0–18.0)
BUN / CREAT RATIO: 27 (ref 12–28)
BUN: 45 mg/dL — ABNORMAL HIGH (ref 8–27)
CO2: 22 mmol/L (ref 20–29)
Calcium: 9.5 mg/dL (ref 8.7–10.3)
Chloride: 105 mmol/L (ref 96–106)
Creatinine, Ser: 1.64 mg/dL — ABNORMAL HIGH (ref 0.57–1.00)
GFR calc Af Amer: 32 mL/min/{1.73_m2} — ABNORMAL LOW (ref >59)
GFR, EST NON AFRICAN AMERICAN: 28 mL/min/{1.73_m2} — AB (ref >59)
GLUCOSE: 93 mg/dL (ref 65–99)
POTASSIUM: 5.1 mmol/L (ref 3.5–5.2)
SODIUM: 143 mmol/L (ref 134–144)

## 2017-12-11 LAB — HEMOGLOBIN A1C
ESTIMATED AVERAGE GLUCOSE: 154 mg/dL
HEMOGLOBIN A1C: 7 % — AB (ref 4.8–5.6)

## 2017-12-18 ENCOUNTER — Telehealth: Payer: Self-pay

## 2017-12-18 ENCOUNTER — Other Ambulatory Visit: Payer: Self-pay

## 2017-12-18 NOTE — Telephone Encounter (Signed)
Left the patient a message that I was calling to get the name of the medication that she said that the directions was changed on so that I can let Ms. Laurance Flatten, [place] know.

## 2018-01-20 ENCOUNTER — Telehealth: Payer: Self-pay

## 2018-01-20 ENCOUNTER — Other Ambulatory Visit: Payer: Self-pay | Admitting: Nurse Practitioner

## 2018-01-20 ENCOUNTER — Other Ambulatory Visit: Payer: Medicare Other

## 2018-01-20 DIAGNOSIS — M109 Gout, unspecified: Secondary | ICD-10-CM

## 2018-01-20 LAB — BMP8+EGFR
BUN/Creatinine Ratio: 28 (ref 12–28)
BUN: 45 mg/dL — ABNORMAL HIGH (ref 8–27)
CO2: 21 mmol/L (ref 20–29)
CREATININE: 1.58 mg/dL — AB (ref 0.57–1.00)
Calcium: 9.7 mg/dL (ref 8.7–10.3)
Chloride: 103 mmol/L (ref 96–106)
GFR calc Af Amer: 33 mL/min/{1.73_m2} — ABNORMAL LOW (ref >59)
GFR, EST NON AFRICAN AMERICAN: 29 mL/min/{1.73_m2} — AB (ref >59)
Glucose: 118 mg/dL — ABNORMAL HIGH (ref 65–99)
POTASSIUM: 5.1 mmol/L (ref 3.5–5.2)
SODIUM: 140 mmol/L (ref 134–144)

## 2018-01-20 NOTE — Progress Notes (Signed)
bm 

## 2018-01-20 NOTE — Telephone Encounter (Signed)
Patient called stating she is unable to make her appointment today at 8:30 I returned pt call to reschedule her appt left v/m to call office. YRL,RMA

## 2018-01-22 NOTE — Progress Notes (Signed)
Patient ID: [name], female   DOB: 1927/05/08, 84 y.o.   MRN: [id number]     Cardiology Office Note   Date:  01/25/2018   ID:  [name], DOB [birth year], MRN [id number]  PCP:  Glendale Chard, MD  Cardiologist:   Jenkins Rouge, MD   No chief complaint on file.     History of Present Illness: [name] is a 84 y.o. female who presents for evaluation of CAD. Distant history of CABG complicated by CVA.  Last cath 06/2010 with patent grafts diffuse distal LAD disease   SVG OM/D1 sequential SVG PDA LIMA to LAD  EF normal at that time    CRF;s HTN and elevated lipids on Rx  Has been on Aggrenox since stroke 09/08/13 Carotid 12/08/17  plaque no stenosis  Living independently no angina , palpitations or syncope No dyspnea Ambulates with cain.  DM under good control  No chest pain No longer driving . Living independently has one son living in [place] and one in [place]   No angina active walking mild edema in RLE       Past Medical History:  Diagnosis Date  . Cerebrovascular disease, unspecified   . Coronary atherosclerosis of unspecified type of vessel, native or graft   . Diabetes mellitus without complication ([place])   . Other and unspecified hyperlipidemia   . Rheumatoid arthritis(714.0)   . Unspecified essential hypertension     Past Surgical History:  Procedure Laterality Date  . Cardiac Bypass    . EYE SURGERY       Current Outpatient Medications  Medication Sig Dispense Refill  . acetaminophen (TYLENOL) 500 MG tablet Take 500 mg by mouth every 6 (six) hours as needed.      [name] allopurinol (ZYLOPRIM) 100 MG tablet Take 100 mg by mouth daily.    [name] amLODipine (NORVASC) 5 MG tablet Take 5 mg by mouth daily.      . Calcium Carb-Vit D-Soy Isoflav (CALTRATE 600 + SOY PO) Take by mouth.      . carvedilol (COREG) 12.5 MG tablet TAKE 1 TABLET BY MOUTH TWICE DAILY WITH FOOD 90 tablet 0  . Cholecalciferol (VITAMIN D3) 2000 UNITS capsule Take 2,000 Units by mouth  daily.    [name] dipyridamole-aspirin (AGGRENOX) 25-200 MG per 12 hr capsule Take 1 capsule by mouth 2 (two) times daily.      . fish oil-omega-3 fatty acids 1000 MG capsule Take 2 g by mouth daily.      . fluticasone (FLONASE) 50 MCG/ACT nasal spray Place 2 sprays into both nostrils daily. 16 g 6  . glimepiride (AMARYL) 1 MG tablet TAKE 1 TABLET BY MOUTH EVERY DAY 90 tablet 0  . glucosamine-chondroitin 500-400 MG tablet Take 1 tablet by mouth 3 (three) times daily.      . irbesartan-hydrochlorothiazide (AVALIDE) 150-12.5 MG tablet TAKE 1 TABLET BY MOUTH EVERY DAY 90 tablet 0  . loratadine (CLARITIN) 10 MG tablet Take 1 tablet (10 mg total) by mouth daily. 30 tablet 11  . Multiple Vitamin (MULTIVITAMIN) capsule Take 1 capsule by mouth daily.      . nitroGLYCERIN (NITROSTAT) 0.4 MG SL tablet Place 1 tablet (0.4 mg total) under the tongue every 5 (five) minutes as needed. 25 tablet 2  . rosuvastatin (CRESTOR) 5 MG tablet Take 5 mg by mouth daily.       No current facility-administered medications for this visit.     Allergies:   Patient has no known allergies.    Social  History:  The patient  reports that she has never smoked. She has never used smokeless tobacco. She reports that she does not drink alcohol or use drugs.   Family History:  The patient's family history includes Cancer in her brother and mother; Heart disease in her brother; Hypertension in her father and paternal grandmother.    ROS:  Please see the history of present illness.   Otherwise, review of systems are positive for none.   All other systems are reviewed and negative.    PHYSICAL EXAM: VS:  BP (!) 160/70 (BP Location: Left Arm)   Pulse 60   Resp 11   Ht 5\' 4"  (1.626 m)   Wt 149 lb (67.6 kg)   BMI 25.58 kg/m  , BMI Body mass index is 25.58 kg/m. Affect appropriate Healthy:  appears stated age 89: normal Neck supple with no adenopathy JVP normal no bruits no thyromegaly Lungs clear with no wheezing and good  diaphragmatic motion Heart:  S1/S2 no murmur, no rub, gallop or click PMI normal Abdomen: benighn, BS positve, no tenderness, no AAA no bruit.  No HSM or HJR Distal pulses intact with no bruits No edema Neuro non-focal Skin warm and dry No muscular weakness    EKG:   10/25/13  SR rate 68 normal ECG  11/02/14  SR rate 65 nonspecific ST changes essentially normal  12/17/16 SR rate 66 nonspecific ST changes  01/25/18 SR rate 66 normal   Recent Labs: 06/11/2017: ALT 13 01/20/2018: BUN 45; Creatinine, Ser 1.58; Potassium 5.1; Sodium 140    Lipid Panel No results found for: CHOL, TRIG, HDL, CHOLHDL, VLDL, LDLCALC, LDLDIRECT    Wt Readings from Last 3 Encounters:  01/25/18 149 lb (67.6 kg)  12/10/17 151 lb (68.5 kg)  09/03/17 150 lb (68 kg)      Other studies Reviewed: Additional studies/ records that were reviewed today include: Epic notes and old CABG report .    ASSESSMENT AND PLAN:  1. CAD/CABG: CABG 2003 with patent grafts on cath 2012 no angina continue medical Rx 2. Carotid:  Duplex 12/08/17  plaque no stenosis f/u duplex October 2021 3. HTN:.  Well controlled.  Continue current medications and low sodium Dash type diet.   4. DM:  Discussed low carb diet.  Target hemoglobin A1c is 6.5 or less.  Continue current medications. 5. Chol on crestor labs with primary 6. Edema:  RLE from SVG harvest chronic Has diuretic on board   Current medicines are reviewed at length with the patient today.  The patient does not have concerns regarding medicines.  The following changes have been made:  no change  Labs/ tests ordered today include:   None     Orders Placed This Encounter  Procedures  . EKG 12-Lead     Disposition:   FU with me in a year      Signed, Jenkins Rouge, MD  01/25/2018 10:14 AM    [facility] [address], [place], [place]  [address] Phone: [phone]; Fax: [phone]

## 2018-01-25 ENCOUNTER — Encounter: Payer: Self-pay | Admitting: Cardiovascular Disease

## 2018-01-25 ENCOUNTER — Ambulatory Visit: Payer: Medicare Other | Admitting: Cardiovascular Disease

## 2018-01-25 VITALS — BP 160/70 | HR 60 | Resp 11 | Ht 64.0 in | Wt 149.0 lb

## 2018-01-25 DIAGNOSIS — I2510 Atherosclerotic heart disease of native coronary artery without angina pectoris: Secondary | ICD-10-CM

## 2018-01-25 NOTE — Patient Instructions (Signed)
Medication Instructions:  Your physician recommends that you continue on your current medications as directed. Please refer to the Current Medication list given to you today.  If you need a refill on your cardiac medications before your next appointment, please call your pharmacy.   Lab work: None ordered If you have labs (blood work) drawn today and your tests are completely normal, you will receive your results only by: [name] MyChart Message (if you have MyChart) OR . A paper copy in the mail If you have any lab test that is abnormal or we need to change your treatment, we will call you to review the results.  Testing/Procedures: None ordered  Follow-Up: At [facility], you and your health needs are our priority.  As part of our continuing mission to provide you with exceptional heart care, we have created designated Provider Care Teams.  These Care Teams include your primary Cardiologist (physician) and Advanced Practice Providers (APPs -  Physician Assistants and Nurse Practitioners) who all work together to provide you with the care you need, when you need it. You will need a follow up appointment in 12 months.  Please call our office 2 months in advance to schedule this appointment.  You may see Dr. Johnsie Cancel or one of the following Advanced Practice Providers on your designated Care Team:   Truitt Merle, NP Cecilie Kicks, NP . Kathyrn Drown, NP  Any Other Special Instructions Will Be Listed Below (If Applicable).

## 2018-02-18 ENCOUNTER — Other Ambulatory Visit: Payer: Self-pay | Admitting: Nurse Practitioner

## 2018-02-19 ENCOUNTER — Other Ambulatory Visit: Payer: Self-pay | Admitting: Nurse Practitioner

## 2018-03-01 ENCOUNTER — Other Ambulatory Visit: Payer: Self-pay | Admitting: Nurse Practitioner

## 2018-03-02 ENCOUNTER — Other Ambulatory Visit: Payer: Self-pay | Admitting: Nurse Practitioner

## 2018-03-16 ENCOUNTER — Ambulatory Visit: Payer: Medicare Other | Admitting: Internal Medicine

## 2018-03-16 ENCOUNTER — Encounter: Payer: Self-pay | Admitting: Internal Medicine

## 2018-03-16 VITALS — BP 128/76 | HR 62 | Temp 98.6°F | Ht 61.4 in | Wt 152.4 lb

## 2018-03-16 DIAGNOSIS — I1310 Hypertensive heart and chronic kidney disease without heart failure, with stage 1 through stage 4 chronic kidney disease, or unspecified chronic kidney disease: Secondary | ICD-10-CM | POA: Diagnosis not present

## 2018-03-16 DIAGNOSIS — N183 Chronic kidney disease, stage 3 unspecified: Secondary | ICD-10-CM

## 2018-03-16 DIAGNOSIS — E7800 Pure hypercholesterolemia, unspecified: Secondary | ICD-10-CM

## 2018-03-16 DIAGNOSIS — I2510 Atherosclerotic heart disease of native coronary artery without angina pectoris: Secondary | ICD-10-CM | POA: Diagnosis not present

## 2018-03-16 DIAGNOSIS — E1122 Type 2 diabetes mellitus with diabetic chronic kidney disease: Secondary | ICD-10-CM | POA: Diagnosis not present

## 2018-03-16 DIAGNOSIS — M1A3790 Chronic gout due to renal impairment, unspecified ankle and foot, without tophus (tophi): Secondary | ICD-10-CM

## 2018-03-16 MED ORDER — ROSUVASTATIN CALCIUM 5 MG PO TABS: 5.0000 mg | ORAL_TABLET | Freq: Every day | ORAL | 2 refills | Status: DC

## 2018-03-16 NOTE — Patient Instructions (Signed)
Hypoglycemia Hypoglycemia is when the sugar (glucose) level in your blood is too low. Signs of low blood sugar may include:  Feeling: ? Hungry. ? Worried or nervous (anxious). ? Sweaty and clammy. ? Confused. ? Dizzy. ? Sleepy. ? Sick to your stomach (nauseous).  Having: ? A fast heartbeat. ? A headache. ? A change in your vision. ? Tingling or no feeling (numbness) around your mouth, lips, or tongue. ? Jerky movements that you cannot control (seizure).  Having trouble with: ? Moving (coordination). ? Sleeping. ? Passing out (fainting). ? Getting upset easily (irritability). Low blood sugar can happen to people who have diabetes and people who do not have diabetes. Low blood sugar can happen quickly, and it can be an emergency. Treating low blood sugar Low blood sugar is often treated by eating or drinking something sugary right away, such as:  Fruit juice, 4-6 oz (120-150 mL).  Regular soda (not diet soda), 4-6 oz (120-150 mL).  Low-fat milk, 4 oz (120 mL).  Several pieces of hard candy.  Sugar or honey, 1 Tbsp (15 mL). Treating low blood sugar if you have diabetes If you can think clearly and swallow safely, follow the 15:15 rule:  Take 15 grams of a fast-acting carb (carbohydrate). Talk with your doctor about how much you should take.  Always keep a source of fast-acting carb with you, such as: ? Sugar tablets (glucose pills). Take 3-4 pills. ? 6-8 pieces of hard candy. ? 4-6 oz (120-150 mL) of fruit juice. ? 4-6 oz (120-150 mL) of regular (not diet) soda. ? 1 Tbsp (15 mL) honey or sugar.  Check your blood sugar 15 minutes after you take the carb.  If your blood sugar is still at or below 70 mg/dL (3.9 mmol/L), take 15 grams of a carb again.  If your blood sugar does not go above 70 mg/dL (3.9 mmol/L) after 3 tries, get help right away.  After your blood sugar goes back to normal, eat a meal or a snack within 1 hour.  Treating very low blood sugar If your  blood sugar is at or below 54 mg/dL (3 mmol/L), you have very low blood sugar (severe hypoglycemia). This may also cause:  Passing out.  Jerky movements you cannot control (seizure).  Losing consciousness (coma). This is an emergency. Do not wait to see if the symptoms will go away. Get medical help right away. Call your local emergency services (911 in the [place] Do not drive yourself to the hospital. If you have very low blood sugar and you cannot eat or drink, you may need a glucagon shot (injection). A family member or friend should learn how to check your blood sugar and how to give you a glucagon shot. Ask your doctor if you need to have a glucagon shot kit at home. Follow these instructions at home: General instructions  Take over-the-counter and prescription medicines only as told by your doctor.  Stay aware of your blood sugar as told by your doctor.  Limit alcohol intake to no more than 1 drink a day for nonpregnant women and 2 drinks a day for men. One drink equals 12 oz of beer (355 mL), 5 oz of wine (148 mL), or 1 oz of hard liquor (44 mL).  Keep all follow-up visits as told by your doctor. This is important. If you have diabetes:   Follow your diabetes care plan as told by your doctor. Make sure you: ? Know the signs of low blood sugar. ?  Take your medicines as told. ? Follow your exercise and meal plan. ? Eat on time. Do not skip meals. ? Check your blood sugar as often as told by your doctor. Always check it before and after exercise. ? Follow your sick day plan when you cannot eat or drink normally. Make this plan ahead of time with your doctor.  Share your diabetes care plan with: ? Your work or school. ? People you live with.  Check your pee (urine) for ketones: ? When you are sick. ? As told by your doctor.  Carry a card or wear jewelry that says you have diabetes. Contact a doctor if:  You have trouble keeping your blood sugar in your target  range.  You have low blood sugar often. Get help right away if:  You still have symptoms after you eat or drink something sugary.  Your blood sugar is at or below 54 mg/dL (3 mmol/L).  You have jerky movements that you cannot control.  You pass out. These symptoms may be an emergency. Do not wait to see if the symptoms will go away. Get medical help right away. Call your local emergency services (911 in the [place] Do not drive yourself to the hospital. Summary  Hypoglycemia happens when the level of sugar (glucose) in your blood is too low.  Low blood sugar can happen to people who have diabetes and people who do not have diabetes. Low blood sugar can happen quickly, and it can be an emergency.  Make sure you know the signs of low blood sugar and know how to treat it.  Always keep a source of sugar (fast-acting carb) with you to treat low blood sugar. This information is not intended to replace advice given to you by your health care provider. Make sure you discuss any questions you have with your health care provider. Document Released: 04/30/2009 Document Revised: 07/28/2017 Document Reviewed: 03/09/2015 Elsevier Interactive Patient Education  2019 [employer].

## 2018-03-17 LAB — CMP14+EGFR
ALBUMIN: 4.1 g/dL (ref 3.6–4.6)
ALK PHOS: 92 IU/L (ref 39–117)
ALT: 11 IU/L (ref 0–32)
AST: 20 IU/L (ref 0–40)
Albumin/Globulin Ratio: 1.4 (ref 1.2–2.2)
BUN / CREAT RATIO: 20 (ref 12–28)
BUN: 30 mg/dL — ABNORMAL HIGH (ref 8–27)
Bilirubin Total: <0.2 mg/dL (ref 0.0–1.2)
CO2: 19 mmol/L — ABNORMAL LOW (ref 20–29)
CREATININE: 1.5 mg/dL — AB (ref 0.57–1.00)
Calcium: 9.5 mg/dL (ref 8.7–10.3)
Chloride: 103 mmol/L (ref 96–106)
GFR calc Af Amer: 36 mL/min/{1.73_m2} — ABNORMAL LOW (ref >59)
GFR calc non Af Amer: 31 mL/min/{1.73_m2} — ABNORMAL LOW (ref >59)
Globulin, Total: 3 g/dL (ref 1.5–4.5)
Glucose: 118 mg/dL — ABNORMAL HIGH (ref 65–99)
Potassium: 5 mmol/L (ref 3.5–5.2)
Sodium: 139 mmol/L (ref 134–144)
Total Protein: 7.1 g/dL (ref 6.0–8.5)

## 2018-03-17 LAB — LIPID PANEL
CHOLESTEROL TOTAL: 136 mg/dL (ref 100–199)
Chol/HDL Ratio: 4.7 ratio — ABNORMAL HIGH (ref 0.0–4.4)
HDL: 29 mg/dL — AB (ref >39)
LDL Calculated: 54 mg/dL (ref 0–99)
Triglycerides: 265 mg/dL — ABNORMAL HIGH (ref 0–149)
VLDL Cholesterol Cal: 53 mg/dL — ABNORMAL HIGH (ref 5–40)

## 2018-03-17 LAB — URIC ACID: Uric Acid: 7.9 mg/dL — ABNORMAL HIGH (ref 2.5–7.1)

## 2018-03-17 LAB — HEMOGLOBIN A1C
ESTIMATED AVERAGE GLUCOSE: 140 mg/dL
HEMOGLOBIN A1C: 6.5 % — AB (ref 4.8–5.6)

## 2018-03-17 NOTE — Progress Notes (Signed)
Subjective:     Patient ID: [place] , female    DOB: 1927-06-02 , 85 y.o.   MRN: [id number]   Chief Complaint  Patient presents with  . Diabetes  . Hypertension    HPI  Diabetes  She presents for her follow-up diabetic visit. She has type 2 diabetes mellitus. Her disease course has been stable. There are no hypoglycemic associated symptoms. Pertinent negatives for diabetes include no blurred vision and no chest pain. There are no hypoglycemic complications. Risk factors for coronary artery disease include dyslipidemia, hypertension, post-menopausal and diabetes mellitus. Current diabetic treatment includes oral agent (monotherapy).  Hypertension  This is a chronic problem. The current episode started more than 1 year ago. The problem has been gradually improving since onset. The problem is controlled. Pertinent negatives include no blurred vision, chest pain, palpitations or shortness of breath.   She reports compliance with meds.   Past Medical History:  Diagnosis Date  . Cerebrovascular disease, unspecified   . Coronary atherosclerosis of unspecified type of vessel, native or graft   . Diabetes mellitus without complication ([place])   . Other and unspecified hyperlipidemia   . Rheumatoid arthritis(714.0)   . Unspecified essential hypertension      Family History  Problem Relation Age of Onset  . Cancer Mother   . Hypertension Father   . Cancer Brother   . Hypertension Paternal Grandmother   . Heart disease Brother   . Diabetes Neg Hx   . Coronary artery disease Neg Hx      Current Outpatient Medications:  .  amLODipine (NORVASC) 5 MG tablet, TAKE 1 TABLET BY MOUTH EVERY DAY, Disp: 90 tablet, Rfl: 0 .  Calcium Carb-Vit D-Soy Isoflav (CALTRATE 600 + SOY PO), Take by mouth.  , Disp: , Rfl:  .  carvedilol (COREG) 12.5 MG tablet, TAKE 1 TABLET BY MOUTH TWICE DAILY WITH FOOD, Disp: 180 tablet, Rfl: 0 .  Cholecalciferol (VITAMIN D) 125 MCG (5000 UT) CAPS, Take by mouth.,  Disp: , Rfl:  .  dipyridamole-aspirin (AGGRENOX) 25-200 MG per 12 hr capsule, Take 1 capsule by mouth daily. , Disp: , Rfl:  .  fish oil-omega-3 fatty acids 1000 MG capsule, Take 2 g by mouth daily.  , Disp: , Rfl:  .  glimepiride (AMARYL) 1 MG tablet, TAKE 1 TABLET BY MOUTH EVERY DAY, Disp: 90 tablet, Rfl: 0 .  glucosamine-chondroitin 500-400 MG tablet, Take 1 tablet by mouth 3 (three) times daily.  , Disp: , Rfl:  .  irbesartan-hydrochlorothiazide (AVALIDE) 150-12.5 MG tablet, TAKE 1 TABLET BY MOUTH EVERY DAY, Disp: 90 tablet, Rfl: 0 .  Multiple Vitamin (MULTIVITAMIN) capsule, Take 1 capsule by mouth daily.  , Disp: , Rfl:  .  naproxen sodium (ALEVE) 220 MG tablet, Take 220 mg by mouth., Disp: , Rfl:  .  nitroGLYCERIN (NITROSTAT) 0.4 MG SL tablet, Place 1 tablet (0.4 mg total) under the tongue every 5 (five) minutes as needed., Disp: 25 tablet, Rfl: 2 .  rosuvastatin (CRESTOR) 5 MG tablet, Take 1 tablet (5 mg total) by mouth daily., Disp: 90 tablet, Rfl: 2   No Known Allergies   Review of Systems  Constitutional: Negative.   Eyes: Negative for blurred vision.  Respiratory: Negative.  Negative for shortness of breath.   Cardiovascular: Negative.  Negative for chest pain and palpitations.  Gastrointestinal: Negative.   Neurological: Negative.   Psychiatric/Behavioral: Negative.      Today's Vitals   03/16/18 0940  BP: 128/76  Pulse: 62  Temp: 98.6 F (37 C)  TempSrc: Oral  Weight: 152 lb 6.4 oz (69.1 kg)  Height: 5' 1.4" (1.56 m)  PainSc: 0-No pain   Body mass index is 28.42 kg/m.   Objective:  Physical Exam Vitals signs and nursing note reviewed.  Constitutional:      Appearance: Normal appearance.  HENT:     Head: Normocephalic and atraumatic.  Cardiovascular:     Rate and Rhythm: Normal rate and regular rhythm.     Heart sounds: Normal heart sounds.  Pulmonary:     Effort: Pulmonary effort is normal.     Breath sounds: Normal breath sounds.  Skin:    General:  Skin is warm.  Neurological:     Mental Status: She is alert.         Assessment And Plan:     1. Type 2 diabetes mellitus with stage 3 chronic kidney disease, without long-term current use of insulin ([place])  I will check labs as listed below. Pt advised of risks associated use sulfonylurea use in setting of CKD.  She reports she has been on this medication for a long time, and was prescribed by her previous physician.  I will consider cutting her dose in half. I will make further recommendations once her lab are available for review.   - CMP14+EGFR - Hemoglobin A1c - Lipid panel  2. Hypertensive heart and renal disease with renal failure, stage 1 through stage 4 or unspecified chronic kidney disease, without heart failure  Well controlled. She will continue with current meds. She is encouraged to avoid adding salt to her foods.   3. Atherosclerosis of native coronary artery of native heart without angina pectoris  Chronic, yet stable. Importance of compliance with statin use was discussed with the patient.   4. Pure hypercholesterolemia   I will check lipid panel and LFTs. She is encouraged to limit her intake of fried foods.  5. Chronic gout due to renal impairment involving foot without tophus, unspecified laterality  I will check uric acid level. She is encouraged to stay well hydrated.   - Uric acid        [name], MD

## 2018-03-18 ENCOUNTER — Other Ambulatory Visit: Payer: Self-pay | Admitting: Nurse Practitioner

## 2018-03-18 ENCOUNTER — Other Ambulatory Visit: Payer: Self-pay

## 2018-03-18 DIAGNOSIS — I2510 Atherosclerotic heart disease of native coronary artery without angina pectoris: Secondary | ICD-10-CM

## 2018-03-18 MED ORDER — ASPIRIN-DIPYRIDAMOLE ER 25-200 MG PO CP12: 1.0000 | ORAL_CAPSULE | Freq: Every day | ORAL | 1 refills | Status: DC

## 2018-03-18 MED ORDER — GLIMEPIRIDE 1 MG PO TABS: ORAL_TABLET | ORAL | 1 refills | Status: DC

## 2018-03-18 NOTE — Progress Notes (Signed)
Okay I will send a Rx for once daily Aggrenox. No she does not get frequent gout flares.

## 2018-05-05 ENCOUNTER — Encounter: Payer: Self-pay | Admitting: Internal Medicine

## 2018-05-19 ENCOUNTER — Other Ambulatory Visit: Payer: Self-pay | Admitting: Nurse Practitioner

## 2018-05-19 ENCOUNTER — Other Ambulatory Visit: Payer: Self-pay | Admitting: Internal Medicine

## 2018-08-09 ENCOUNTER — Other Ambulatory Visit: Payer: Self-pay | Admitting: Nurse Practitioner

## 2018-08-09 ENCOUNTER — Other Ambulatory Visit: Payer: Self-pay | Admitting: Internal Medicine

## 2018-08-14 ENCOUNTER — Other Ambulatory Visit: Payer: Self-pay | Admitting: Nurse Practitioner

## 2018-09-16 ENCOUNTER — Other Ambulatory Visit: Payer: Self-pay

## 2018-09-16 ENCOUNTER — Ambulatory Visit (INDEPENDENT_AMBULATORY_CARE_PROVIDER_SITE_OTHER): Payer: Medicare Other | Admitting: Internal Medicine

## 2018-09-16 ENCOUNTER — Ambulatory Visit (INDEPENDENT_AMBULATORY_CARE_PROVIDER_SITE_OTHER): Payer: Medicare Other

## 2018-09-16 ENCOUNTER — Encounter: Payer: Self-pay | Admitting: Internal Medicine

## 2018-09-16 VITALS — BP 140/78 | HR 68 | Temp 98.4°F | Ht 60.6 in | Wt 145.8 lb

## 2018-09-16 DIAGNOSIS — N183 Chronic kidney disease, stage 3 (moderate): Secondary | ICD-10-CM | POA: Diagnosis not present

## 2018-09-16 DIAGNOSIS — Z23 Encounter for immunization: Secondary | ICD-10-CM | POA: Diagnosis not present

## 2018-09-16 DIAGNOSIS — E1122 Type 2 diabetes mellitus with diabetic chronic kidney disease: Secondary | ICD-10-CM | POA: Diagnosis not present

## 2018-09-16 DIAGNOSIS — Z0000 Encounter for general adult medical examination without abnormal findings: Secondary | ICD-10-CM | POA: Diagnosis not present

## 2018-09-16 DIAGNOSIS — I1310 Hypertensive heart and chronic kidney disease without heart failure, with stage 1 through stage 4 chronic kidney disease, or unspecified chronic kidney disease: Secondary | ICD-10-CM

## 2018-09-16 DIAGNOSIS — I2510 Atherosclerotic heart disease of native coronary artery without angina pectoris: Secondary | ICD-10-CM

## 2018-09-16 DIAGNOSIS — M069 Rheumatoid arthritis, unspecified: Secondary | ICD-10-CM

## 2018-09-16 DIAGNOSIS — E663 Overweight: Secondary | ICD-10-CM

## 2018-09-16 DIAGNOSIS — E119 Type 2 diabetes mellitus without complications: Secondary | ICD-10-CM

## 2018-09-16 DIAGNOSIS — E7800 Pure hypercholesterolemia, unspecified: Secondary | ICD-10-CM

## 2018-09-16 LAB — POCT URINALYSIS DIPSTICK
Bilirubin, UA: NEGATIVE
Glucose, UA: NEGATIVE
Ketones, UA: NEGATIVE
Leukocytes, UA: NEGATIVE
Nitrite, UA: NEGATIVE
Protein, UA: POSITIVE — AB
Spec Grav, UA: 1.02 (ref 1.010–1.025)
Urobilinogen, UA: 0.2 E.U./dL
pH, UA: 6 (ref 5.0–8.0)

## 2018-09-16 LAB — POCT UA - MICROALBUMIN
Creatinine, POC: 100 mg/dL
Microalbumin Ur, POC: 150 mg/L

## 2018-09-16 MED ORDER — PREVNAR 13 IM SUSP: 0.5000 mL | INTRAMUSCULAR | 0 refills | Status: AC

## 2018-09-16 MED ORDER — BOOSTRIX 5-2.5-18.5 LF-MCG/0.5 IM SUSP: 0.5000 mL | Freq: Once | INTRAMUSCULAR | 0 refills | Status: AC

## 2018-09-16 NOTE — Patient Instructions (Signed)
[name] , Thank you for taking time to come for your Medicare Wellness Visit. I appreciate your ongoing commitment to your health goals. Please review the following plan we discussed and let me know if I can assist you in the future.   Screening recommendations/referrals: Colonoscopy: not required Mammogram: 02/2018 Bone Density: 02/2018 Recommended yearly ophthalmology/optometry visit for glaucoma screening and checkup Recommended yearly dental visit for hygiene and checkup  Vaccinations: Influenza vaccine: 11/2017 Pneumococcal vaccine: sent to pharmacy Tdap vaccine: sent to pharmacy Shingles vaccine: discussed    Advanced directives: Please bring a copy of your POA (Power of [place]) and/or Living Will to your next appointment.    Conditions/risks identified: overweight  Next appointment: 09/16/2018   Preventive Care 23 Years and Older, Female Preventive care refers to lifestyle choices and visits with your health care provider that can promote health and wellness. What does preventive care include?  A yearly physical exam. This is also called an annual well check.  Dental exams once or twice a year.  Routine eye exams. Ask your health care provider how often you should have your eyes checked.  Personal lifestyle choices, including:  Daily care of your teeth and gums.  Regular physical activity.  Eating a healthy diet.  Avoiding tobacco and drug use.  Limiting alcohol use.  Practicing safe sex.  Taking low-dose aspirin every day.  Taking vitamin and mineral supplements as recommended by your health care provider. What happens during an annual well check? The services and screenings done by your health care provider during your annual well check will depend on your age, overall health, lifestyle risk factors, and family history of disease. Counseling  Your health care provider may ask you questions about your:  Alcohol use.  Tobacco use.  Drug use.   Emotional well-being.  Home and relationship well-being.  Sexual activity.  Eating habits.  History of falls.  Memory and ability to understand (cognition).  Work and work Statistician.  Reproductive health. Screening  You may have the following tests or measurements:  Height, weight, and BMI.  Blood pressure.  Lipid and cholesterol levels. These may be checked every 5 years, or more frequently if you are over 21 years old.  Skin check.  Lung cancer screening. You may have this screening every year starting at age 75 if you have a 30-pack-year history of smoking and currently smoke or have quit within the past 15 years.  Fecal occult blood test (FOBT) of the stool. You may have this test every year starting at age 67.  Flexible sigmoidoscopy or colonoscopy. You may have a sigmoidoscopy every 5 years or a colonoscopy every 10 years starting at age 15.  Hepatitis C blood test.  Hepatitis B blood test.  Sexually transmitted disease (STD) testing.  Diabetes screening. This is done by checking your blood sugar (glucose) after you have not eaten for a while (fasting). You may have this done every 1-3 years.  Bone density scan. This is done to screen for osteoporosis. You may have this done starting at age 34.  Mammogram. This may be done every 1-2 years. Talk to your health care provider about how often you should have regular mammograms. Talk with your health care provider about your test results, treatment options, and if necessary, the need for more tests. Vaccines  Your health care provider may recommend certain vaccines, such as:  Influenza vaccine. This is recommended every year.  Tetanus, diphtheria, and acellular pertussis (Tdap, Td) vaccine. You may need  a Td booster every 10 years.  Zoster vaccine. You may need this after age 1.  Pneumococcal 13-valent conjugate (PCV13) vaccine. One dose is recommended after age 75.  Pneumococcal polysaccharide (PPSV23)  vaccine. One dose is recommended after age 61. Talk to your health care provider about which screenings and vaccines you need and how often you need them. This information is not intended to replace advice given to you by your health care provider. Make sure you discuss any questions you have with your health care provider. Document Released: 03/02/2015 Document Revised: 10/24/2015 Document Reviewed: 12/05/2014 Elsevier Interactive Patient Education  2017 [employer] Prevention in the Home Falls can cause injuries. They can happen to people of all ages. There are many things you can do to make your home safe and to help prevent falls. What can I do on the outside of my home?  Regularly fix the edges of walkways and driveways and fix any cracks.  Remove anything that might make you trip as you walk through a door, such as a raised step or threshold.  Trim any bushes or trees on the path to your home.  Use bright outdoor lighting.  Clear any walking paths of anything that might make someone trip, such as rocks or tools.  Regularly check to see if handrails are loose or broken. Make sure that both sides of any steps have handrails.  Any raised decks and porches should have guardrails on the edges.  Have any leaves, snow, or ice cleared regularly.  Use sand or salt on walking paths during winter.  Clean up any spills in your garage right away. This includes oil or grease spills. What can I do in the bathroom?  Use night lights.  Install grab bars by the toilet and in the tub and shower. Do not use towel bars as grab bars.  Use non-skid mats or decals in the tub or shower.  If you need to sit down in the shower, use a plastic, non-slip stool.  Keep the floor dry. Clean up any water that spills on the floor as soon as it happens.  Remove soap buildup in the tub or shower regularly.  Attach bath mats securely with double-sided non-slip rug tape.  Do not have throw rugs  and other things on the floor that can make you trip. What can I do in the bedroom?  Use night lights.  Make sure that you have a light by your bed that is easy to reach.  Do not use any sheets or blankets that are too big for your bed. They should not hang down onto the floor.  Have a firm chair that has side arms. You can use this for support while you get dressed.  Do not have throw rugs and other things on the floor that can make you trip. What can I do in the kitchen?  Clean up any spills right away.  Avoid walking on wet floors.  Keep items that you use a lot in easy-to-reach places.  If you need to reach something above you, use a strong step stool that has a grab bar.  Keep electrical cords out of the way.  Do not use floor polish or wax that makes floors slippery. If you must use wax, use non-skid floor wax.  Do not have throw rugs and other things on the floor that can make you trip. What can I do with my stairs?  Do not leave any items on the  stairs.  Make sure that there are handrails on both sides of the stairs and use them. Fix handrails that are broken or loose. Make sure that handrails are as long as the stairways.  Check any carpeting to make sure that it is firmly attached to the stairs. Fix any carpet that is loose or worn.  Avoid having throw rugs at the top or bottom of the stairs. If you do have throw rugs, attach them to the floor with carpet tape.  Make sure that you have a light switch at the top of the stairs and the bottom of the stairs. If you do not have them, ask someone to add them for you. What else can I do to help prevent falls?  Wear shoes that:  Do not have high heels.  Have rubber bottoms.  Are comfortable and fit you well.  Are closed at the toe. Do not wear sandals.  If you use a stepladder:  Make sure that it is fully opened. Do not climb a closed stepladder.  Make sure that both sides of the stepladder are locked into  place.  Ask someone to hold it for you, if possible.  Clearly mark and make sure that you can see:  Any grab bars or handrails.  First and last steps.  Where the edge of each step is.  Use tools that help you move around (mobility aids) if they are needed. These include:  Canes.  Walkers.  Scooters.  Crutches.  Turn on the lights when you go into a dark area. Replace any light bulbs as soon as they burn out.  Set up your furniture so you have a clear path. Avoid moving your furniture around.  If any of your floors are uneven, fix them.  If there are any pets around you, be aware of where they are.  Review your medicines with your doctor. Some medicines can make you feel dizzy. This can increase your chance of falling. Ask your doctor what other things that you can do to help prevent falls. This information is not intended to replace advice given to you by your health care provider. Make sure you discuss any questions you have with your health care provider. Document Released: 11/30/2008 Document Revised: 07/12/2015 Document Reviewed: 03/10/2014 Elsevier Interactive Patient Education  2017 [employer].

## 2018-09-16 NOTE — Patient Instructions (Signed)
Health Maintenance, Female Adopting a healthy lifestyle and getting preventive care are important in promoting health and wellness. Ask your health care provider about:  The right schedule for you to have regular tests and exams.  Things you can do on your own to prevent diseases and keep yourself healthy. What should I know about diet, weight, and exercise? Eat a healthy diet   Eat a diet that includes plenty of vegetables, fruits, low-fat dairy products, and lean protein.  Do not eat a lot of foods that are high in solid fats, added sugars, or sodium. Maintain a healthy weight Body mass index (BMI) is used to identify weight problems. It estimates body fat based on height and weight. Your health care provider can help determine your BMI and help you achieve or maintain a healthy weight. Get regular exercise Get regular exercise. This is one of the most important things you can do for your health. Most adults should:  Exercise for at least 150 minutes each week. The exercise should increase your heart rate and make you sweat (moderate-intensity exercise).  Do strengthening exercises at least twice a week. This is in addition to the moderate-intensity exercise.  Spend less time sitting. Even light physical activity can be beneficial. Watch cholesterol and blood lipids Have your blood tested for lipids and cholesterol at 83 years of age, then have this test every 5 years. Have your cholesterol levels checked more often if:  Your lipid or cholesterol levels are high.  You are older than 83 years of age.  You are at high risk for heart disease. What should I know about cancer screening? Depending on your health history and family history, you may need to have cancer screening at various ages. This may include screening for:  Breast cancer.  Cervical cancer.  Colorectal cancer.  Skin cancer.  Lung cancer. What should I know about heart disease, diabetes, and high blood  pressure? Blood pressure and heart disease  High blood pressure causes heart disease and increases the risk of stroke. This is more likely to develop in people who have high blood pressure readings, are of African descent, or are overweight.  Have your blood pressure checked: ? Every 3-5 years if you are 18-39 years of age. ? Every year if you are 40 years old or older. Diabetes Have regular diabetes screenings. This checks your fasting blood sugar level. Have the screening done:  Once every three years after age 40 if you are at a normal weight and have a low risk for diabetes.  More often and at a younger age if you are overweight or have a high risk for diabetes. What should I know about preventing infection? Hepatitis B If you have a higher risk for hepatitis B, you should be screened for this virus. Talk with your health care provider to find out if you are at risk for hepatitis B infection. Hepatitis C Testing is recommended for:  Everyone born from 1945 through 1965.  Anyone with known risk factors for hepatitis C. Sexually transmitted infections (STIs)  Get screened for STIs, including gonorrhea and chlamydia, if: ? You are sexually active and are younger than 83 years of age. ? You are older than 83 years of age and your health care provider tells you that you are at risk for this type of infection. ? Your sexual activity has changed since you were last screened, and you are at increased risk for chlamydia or gonorrhea. Ask your health care provider if   you are at risk.  Ask your health care provider about whether you are at high risk for HIV. Your health care provider may recommend a prescription medicine to help prevent HIV infection. If you choose to take medicine to prevent HIV, you should first get tested for HIV. You should then be tested every 3 months for as long as you are taking the medicine. Pregnancy  If you are about to stop having your period (premenopausal) and  you may become pregnant, seek counseling before you get pregnant.  Take 400 to 800 micrograms (mcg) of folic acid every day if you become pregnant.  Ask for birth control (contraception) if you want to prevent pregnancy. Osteoporosis and menopause Osteoporosis is a disease in which the bones lose minerals and strength with aging. This can result in bone fractures. If you are 65 years old or older, or if you are at risk for osteoporosis and fractures, ask your health care provider if you should:  Be screened for bone loss.  Take a calcium or vitamin D supplement to lower your risk of fractures.  Be given hormone replacement therapy (HRT) to treat symptoms of menopause. Follow these instructions at home: Lifestyle  Do not use any products that contain nicotine or tobacco, such as cigarettes, e-cigarettes, and chewing tobacco. If you need help quitting, ask your health care provider.  Do not use street drugs.  Do not share needles.  Ask your health care provider for help if you need support or information about quitting drugs. Alcohol use  Do not drink alcohol if: ? Your health care provider tells you not to drink. ? You are pregnant, may be pregnant, or are planning to become pregnant.  If you drink alcohol: ? Limit how much you use to 0-1 drink a day. ? Limit intake if you are breastfeeding.  Be aware of how much alcohol is in your drink. In the [place] one drink equals one 12 oz bottle of beer (355 mL), one 5 oz glass of wine (148 mL), or one 1 oz glass of hard liquor (44 mL). General instructions  Schedule regular health, dental, and eye exams.  Stay current with your vaccines.  Tell your health care provider if: ? You often feel depressed. ? You have ever been abused or do not feel safe at home. Summary  Adopting a healthy lifestyle and getting preventive care are important in promoting health and wellness.  Follow your health care provider's instructions about healthy  diet, exercising, and getting tested or screened for diseases.  Follow your health care provider's instructions on monitoring your cholesterol and blood pressure. This information is not intended to replace advice given to you by your health care provider. Make sure you discuss any questions you have with your health care provider. Document Released: 08/19/2010 Document Revised: 01/27/2018 Document Reviewed: 01/27/2018 Elsevier Patient Education  2020 [employer]  

## 2018-09-16 NOTE — Progress Notes (Signed)
Subjective:   [name] is a 85 y.o. female who presents for Medicare Annual (Subsequent) preventive examination.  Review of Systems:  n/a Cardiac Risk Factors include: advanced age (>72men, >48 women);diabetes mellitus;dyslipidemia;hypertension;sedentary lifestyle     Objective:     Vitals: BP 140/78 (BP Location: Left Arm, Patient Position: Sitting, Cuff Size: Normal)   Pulse 68   Temp 98.4 F (36.9 C) (Oral)   Ht 5' 0.6" (1.539 m)   Wt 145 lb 12.8 oz (66.1 kg)   SpO2 98%   BMI 27.91 kg/m   Body mass index is 27.91 kg/m.  Advanced Directives 09/16/2018  Does Patient Have a Medical Advance Directive? Yes  Type of Paramedic of [place];Living will  Copy of [place] in Chart? No - copy requested    Tobacco Social History   Tobacco Use  Smoking Status Never Smoker  Smokeless Tobacco Never Used     Counseling given: Not Answered   Clinical Intake:  Pre-visit preparation completed: Yes  Pain : 0-10 Pain Score: 10-Worst pain ever Pain Type: Chronic pain Pain Location: Hip Pain Orientation: Right Pain Radiating Towards: Whole right side Pain Descriptors / Indicators: Aching Pain Onset: More than a month ago Pain Frequency: Intermittent Pain Relieving Factors: rest and heat help Effect of Pain on Daily Activities: affects walking  Pain Relieving Factors: rest and heat help  Nutritional Status: BMI 25 -29 Overweight Nutritional Risks: None Diabetes: Yes CBG done?: No Did pt. bring in CBG monitor from home?: No  How often do you need to have someone help you when you read instructions, pamphlets, or other written materials from your doctor or pharmacy?: 1 - Never What is the last grade level you completed in school?: 2 years college  Interpreter Needed?: No  Information entered by :: NAllen LPN  Past Medical History:  Diagnosis Date  . Cerebrovascular disease, unspecified   . Coronary atherosclerosis  of unspecified type of vessel, native or graft   . Diabetes mellitus without complication ([place])   . Other and unspecified hyperlipidemia   . Rheumatoid arthritis(714.0)   . Unspecified essential hypertension    Past Surgical History:  Procedure Laterality Date  . Cardiac Bypass    . EYE SURGERY     Family History  Problem Relation Age of Onset  . Cancer Mother   . Hypertension Father   . Cancer Brother   . Hypertension Paternal Grandmother   . Heart disease Brother   . Diabetes Neg Hx   . Coronary artery disease Neg Hx    Social History   Socioeconomic History  . Marital status: Widowed    Spouse name: Not on file  . Number of children: Not on file  . Years of education: Not on file  . Highest education level: Not on file  Occupational History  . Occupation: retired  Scientific laboratory technician  . Financial resource strain: Not hard at all  . Food insecurity    Worry: Never true    Inability: Never true  . Transportation needs    Medical: No    Non-medical: No  Tobacco Use  . Smoking status: Never Smoker  . Smokeless tobacco: Never Used  Substance and Sexual Activity  . Alcohol use: No  . Drug use: No  . Sexual activity: Not Currently  Lifestyle  . Physical activity    Days per week: 0 days    Minutes per session: 0 min  . Stress: Not at all  Relationships  .  Social Herbalist on phone: Not on file    Gets together: Not on file    Attends religious service: Not on file    Active member of club or organization: Not on file    Attends meetings of clubs or organizations: Not on file    Relationship status: Not on file  Other Topics Concern  . Not on file  Social History Narrative   Retired   Tobacco use- no   Alcohol use-no          Outpatient Encounter Medications as of 09/16/2018  Medication Sig  . amLODipine (NORVASC) 5 MG tablet TAKE 1 TABLET BY MOUTH EVERY DAY  . carvedilol (COREG) 12.5 MG tablet TAKE 1 TABLET BY MOUTH TWICE DAILY WITH FOOD  .  Cholecalciferol (VITAMIN D) 125 MCG (5000 UT) CAPS Take by mouth.  . dipyridamole-aspirin (AGGRENOX) 200-25 MG 12hr capsule Take 1 capsule by mouth daily.  [name] glimepiride (AMARYL) 1 MG tablet Take 1/2 tablet by mouth daily  . glucosamine-chondroitin 500-400 MG tablet Take 1 tablet by mouth 3 (three) times daily.    . irbesartan-hydrochlorothiazide (AVALIDE) 150-12.5 MG tablet TAKE 1 TABLET BY MOUTH EVERY DAY  . nitroGLYCERIN (NITROSTAT) 0.4 MG SL tablet PLACE 1 TABLET UNDER THE TONGUE AT THE 1ST SIGN OF ATTACK, MAY REPEAT EVERY 5 MINUTES FOR 3 DOSES IN 15 MINUTES, IF PAIN PERSISTS, CALL 911  . rosuvastatin (CRESTOR) 5 MG tablet Take 1 tablet (5 mg total) by mouth daily.  . Calcium Carb-Vit D-Soy Isoflav (CALTRATE 600 + SOY PO) Take by mouth.    . fish oil-omega-3 fatty acids 1000 MG capsule Take 2 g by mouth daily.    . Multiple Vitamin (MULTIVITAMIN) capsule Take 1 capsule by mouth daily.    . naproxen sodium (ALEVE) 220 MG tablet Take 220 mg by mouth.  . [DISCONTINUED] amLODipine (NORVASC) 5 MG tablet TAKE 1 TABLET BY MOUTH EVERY DAY  . [DISCONTINUED] carvedilol (COREG) 12.5 MG tablet TAKE 1 TABLET BY MOUTH TWICE DAILY WITH FOOD  . [DISCONTINUED] dipyridamole-aspirin (AGGRENOX) 25-200 MG per 12 hr capsule Take 1 capsule by mouth daily.   . [DISCONTINUED] glimepiride (AMARYL) 1 MG tablet TAKE 1 TABLET BY MOUTH EVERY DAY  . [DISCONTINUED] irbesartan-hydrochlorothiazide (AVALIDE) 150-12.5 MG tablet TAKE 1 TABLET BY MOUTH EVERY DAY  . [DISCONTINUED] nitroGLYCERIN (NITROSTAT) 0.4 MG SL tablet Place 1 tablet (0.4 mg total) under the tongue every 5 (five) minutes as needed.   No facility-administered encounter medications on file as of 09/16/2018.     Activities of Daily Living In your present state of health, do you have any difficulty performing the following activities: 09/16/2018  Hearing? N  Vision? N  Difficulty concentrating or making decisions? N  Walking or climbing stairs? Y  Comment  arthritis  Dressing or bathing? N  Doing errands, shopping? N  Preparing Food and eating ? N  Using the Toilet? N  In the past six months, have you accidently leaked urine? Y  Do you have problems with loss of bowel control? N  Managing your Medications? N  Managing your Finances? N  Housekeeping or managing your Housekeeping? N  Some recent data might be hidden    Patient Care Team: Glendale Chard, MD as PCP - General (Internal Medicine) Josue Hector, MD as PCP - Cardiology (Cardiology)    Assessment:   This is a routine wellness examination for Kori.  Exercise Activities and Dietary recommendations Current Exercise Habits: The patient does not participate  in regular exercise at present  Goals    . Exercise 150 min/wk Moderate Activity     09/16/2018, wants to exercise more       Fall Risk Fall Risk  09/16/2018 03/16/2018 12/10/2017  Falls in the past year? 0 0 No  Risk for fall due to : Impaired balance/gait;Impaired mobility;Medication side effect - -  Follow up Falls evaluation completed;Education provided;Falls prevention discussed - -   Is the patient's home free of loose throw rugs in walkways, pet beds, electrical cords, etc?   yes      Grab bars in the bathroom? yes      Handrails on the stairs?   yes      Adequate lighting?   yes  Timed Get Up and Go performed: n/a  Depression Screen PHQ 2/9 Scores 09/16/2018 03/16/2018 12/10/2017 05/10/2015  PHQ - 2 Score 0 0 0 0  PHQ- 9 Score 0 - - -     Cognitive Function     6CIT Screen 09/16/2018  What Year? 0 points  What month? 0 points  What time? 0 points  Count back from 20 4 points  Months in reverse 0 points  Repeat phrase 0 points  Total Score 4    Immunization History  Administered Date(s) Administered  . Influenza-Unspecified 11/17/2012, 11/20/2017    Qualifies for Shingles Vaccine? yes  Screening Tests Health Maintenance  Topic Date Due  . FOOT EXAM  08/09/1939  . TETANUS/TDAP  08/08/1948   . DEXA SCAN  08/09/1994  . PNA vac Low Risk Adult (2 of 2 - PPSV23) 01/01/2017  . OPHTHALMOLOGY EXAM  08/18/2018  . HEMOGLOBIN A1C  09/14/2018  . INFLUENZA VACCINE  09/18/2018    Cancer Screenings: Lung: Low Dose CT Chest recommended if Age 3-80 years, 30 pack-year currently smoking OR have quit w/in 15years. Patient does not qualify. Breast:  Up to date on Mammogram? Yes   Up to date of Bone Density/Dexa? Yes Colorectal: not required  Additional Screenings: : Hepatitis C Screening: n/a     Plan:    6 CIT was 4. Patient wants to start exercising more.  I have personally reviewed and noted the following in the patient's chart:   . Medical and social history . Use of alcohol, tobacco or illicit drugs  . Current medications and supplements . Functional ability and status . Nutritional status . Physical activity . Advanced directives . List of other physicians . Hospitalizations, surgeries, and ER visits in previous 12 months . Vitals . Screenings to include cognitive, depression, and falls . Referrals and appointments  In addition, I have reviewed and discussed with patient certain preventive protocols, quality metrics, and best practice recommendations. A written personalized care plan for preventive services as well as general preventive health recommendations were provided to patient.     Kellie Simmering, LPN  7/35/3299

## 2018-09-16 NOTE — Addendum Note (Signed)
Addended by: Glenna Durand E on: 09/16/2018 12:47 PM   Modules accepted: Orders

## 2018-09-17 LAB — CBC
Hematocrit: 35.2 % (ref 34.0–46.6)
Hemoglobin: 11.5 g/dL (ref 11.1–15.9)
MCH: 31.9 pg (ref 26.6–33.0)
MCHC: 32.7 g/dL (ref 31.5–35.7)
MCV: 98 fL — ABNORMAL HIGH (ref 79–97)
Platelets: 222 10*3/uL (ref 150–450)
RBC: 3.61 x10E6/uL — ABNORMAL LOW (ref 3.77–5.28)
RDW: 13.2 % (ref 11.7–15.4)
WBC: 5.4 10*3/uL (ref 3.4–10.8)

## 2018-09-17 LAB — CMP14+EGFR
ALT: 9 IU/L (ref 0–32)
AST: 15 IU/L (ref 0–40)
Albumin/Globulin Ratio: 1.7 (ref 1.2–2.2)
Albumin: 4.7 g/dL — ABNORMAL HIGH (ref 3.6–4.6)
Alkaline Phosphatase: 89 IU/L (ref 39–117)
BUN/Creatinine Ratio: 26 (ref 12–28)
BUN: 43 mg/dL — ABNORMAL HIGH (ref 8–27)
Bilirubin Total: 0.3 mg/dL (ref 0.0–1.2)
CO2: 20 mmol/L (ref 20–29)
Calcium: 10 mg/dL (ref 8.7–10.3)
Chloride: 106 mmol/L (ref 96–106)
Creatinine, Ser: 1.67 mg/dL — ABNORMAL HIGH (ref 0.57–1.00)
GFR calc Af Amer: 31 mL/min/{1.73_m2} — ABNORMAL LOW (ref >59)
GFR calc non Af Amer: 27 mL/min/{1.73_m2} — ABNORMAL LOW (ref >59)
Globulin, Total: 2.8 g/dL (ref 1.5–4.5)
Glucose: 145 mg/dL — ABNORMAL HIGH (ref 65–99)
Potassium: 4.7 mmol/L (ref 3.5–5.2)
Sodium: 145 mmol/L — ABNORMAL HIGH (ref 134–144)
Total Protein: 7.5 g/dL (ref 6.0–8.5)

## 2018-09-17 LAB — LIPID PANEL
Chol/HDL Ratio: 3.5 ratio (ref 0.0–4.4)
Cholesterol, Total: 119 mg/dL (ref 100–199)
HDL: 34 mg/dL — ABNORMAL LOW (ref >39)
LDL Calculated: 65 mg/dL (ref 0–99)
Triglycerides: 101 mg/dL (ref 0–149)
VLDL Cholesterol Cal: 20 mg/dL (ref 5–40)

## 2018-09-17 LAB — HEMOGLOBIN A1C
Est. average glucose Bld gHb Est-mCnc: 146 mg/dL
Hgb A1c MFr Bld: 6.7 % — ABNORMAL HIGH (ref 4.8–5.6)

## 2018-09-20 ENCOUNTER — Other Ambulatory Visit: Payer: Self-pay | Admitting: Internal Medicine

## 2018-09-22 ENCOUNTER — Ambulatory Visit: Payer: Self-pay

## 2018-09-22 DIAGNOSIS — M069 Rheumatoid arthritis, unspecified: Secondary | ICD-10-CM

## 2018-09-22 DIAGNOSIS — N183 Chronic kidney disease, stage 3 unspecified: Secondary | ICD-10-CM

## 2018-09-22 DIAGNOSIS — I10 Essential (primary) hypertension: Secondary | ICD-10-CM

## 2018-09-22 DIAGNOSIS — E1122 Type 2 diabetes mellitus with diabetic chronic kidney disease: Secondary | ICD-10-CM

## 2018-09-22 NOTE — Chronic Care Management (AMB) (Signed)
  Care Management Note   [name] is a 85 y.o. year old female who is a primary care patient of Glendale Chard, MD . The CM team was consulted for assistance with chronic care management.   Review of patient status, including review of consultants reports, and collaboration with appropriate care team members and the patient's provider was performed as part of comprehensive patient evaluation and provision of care management services. Telephone outreach to patient today to introduce CM services.   I reached out to [name] by phone today.   Ms. [name] was given information about Chronic Care Management services today including:  1. CCM service includes personalized support from designated clinical staff supervised by her physician, including individualized plan of care and coordination with other care providers 2. 24/7 contact phone numbers for assistance for urgent and routine care needs. 3. Service will only be billed when office clinical staff spend 20 minutes or more in a month to coordinate care. 4. Only one practitioner may furnish and bill the service in a calendar month. 5. The patient may stop CCM services at any time (effective at the end of the month) by phone call to the office staff. 6. The patient will be responsible for cost sharing (co-pay) of up to 20% of the service fee (after annual deductible is met).   Patient agreed to services and verbal consent obtained.    SDOH (social determinants of health) screening was conducted on today's call. The patient does not identify challenges with any social determinants at this time. The patient does not indicate pharmacy needs at this time and reports ability to afford all medications.  Follow Up Plan: No planned SW follow up at this time. CCM SW has collaborated with Chief Strategy Officer via in [employer] regarding patient enrollment into the program.  Daneen Schick, BSW, CDP Social Worker, Certified Dementia  Practitioner [facility] [phone]

## 2018-10-03 ENCOUNTER — Encounter: Payer: Self-pay | Admitting: Internal Medicine

## 2018-10-03 NOTE — Progress Notes (Signed)
Subjective:     Patient ID: [name] , female    DOB: [birth year] , 85 y.o.   MRN: [id number]   Chief Complaint  Patient presents with  . Annual Exam  . Diabetes  . Hypertension    HPI  She is here today for a full physical examination. She does not have any specific concerns or complaints at this time.   Diabetes She presents for her follow-up diabetic visit. She has type 2 diabetes mellitus. There are no hypoglycemic associated symptoms. Pertinent negatives for diabetes include no blurred vision and no chest pain. There are no hypoglycemic complications. Risk factors for coronary artery disease include diabetes mellitus, hypertension, post-menopausal and sedentary lifestyle. She is compliant with treatment most of the time. She is following a diabetic diet. She never participates in exercise. Her home blood glucose trend is fluctuating minimally. An ACE inhibitor/angiotensin II receptor blocker is not being taken. Eye exam is not current.  Hypertension This is a chronic problem. The current episode started more than 1 year ago. The problem has been gradually improving since onset. The problem is controlled. Pertinent negatives include no blurred vision, chest pain, palpitations or shortness of breath. Past treatments include calcium channel blockers. The current treatment provides moderate improvement. Hypertensive end-organ damage includes kidney disease.     Past Medical History:  Diagnosis Date  . Cerebrovascular disease, unspecified   . Coronary atherosclerosis of unspecified type of vessel, native or graft   . Diabetes mellitus without complication ([place])   . Other and unspecified hyperlipidemia   . Rheumatoid arthritis(714.0)   . Unspecified essential hypertension      Family History  Problem Relation Age of Onset  . Cancer Mother   . Hypertension Father   . Cancer Brother   . Hypertension Paternal Grandmother   . Heart disease Brother   . Diabetes Neg Hx   .  Coronary artery disease Neg Hx      Current Outpatient Medications:  .  amLODipine (NORVASC) 5 MG tablet, TAKE 1 TABLET BY MOUTH EVERY DAY, Disp: 90 tablet, Rfl: 0 .  Calcium Carb-Vit D-Soy Isoflav (CALTRATE 600 + SOY PO), Take by mouth.  , Disp: , Rfl:  .  carvedilol (COREG) 12.5 MG tablet, TAKE 1 TABLET BY MOUTH TWICE DAILY WITH FOOD, Disp: 180 tablet, Rfl: 0 .  Cholecalciferol (VITAMIN D) 125 MCG (5000 UT) CAPS, Take by mouth., Disp: , Rfl:  .  dipyridamole-aspirin (AGGRENOX) 200-25 MG 12hr capsule, Take 1 capsule by mouth daily., Disp: 90 capsule, Rfl: 1 .  fish oil-omega-3 fatty acids 1000 MG capsule, Take 2 g by mouth daily.  , Disp: , Rfl:  .  glimepiride (AMARYL) 1 MG tablet, TAKE 1/2 TABLET BY MOUTH DAILY, Disp: 30 tablet, Rfl: 1 .  glucosamine-chondroitin 500-400 MG tablet, Take 1 tablet by mouth 3 (three) times daily.  , Disp: , Rfl:  .  irbesartan-hydrochlorothiazide (AVALIDE) 150-12.5 MG tablet, TAKE 1 TABLET BY MOUTH EVERY DAY, Disp: 90 tablet, Rfl: 0 .  Multiple Vitamin (MULTIVITAMIN) capsule, Take 1 capsule by mouth daily.  , Disp: , Rfl:  .  naproxen sodium (ALEVE) 220 MG tablet, Take 220 mg by mouth., Disp: , Rfl:  .  nitroGLYCERIN (NITROSTAT) 0.4 MG SL tablet, PLACE 1 TABLET UNDER THE TONGUE AT THE 1ST SIGN OF ATTACK, MAY REPEAT EVERY 5 MINUTES FOR 3 DOSES IN 15 MINUTES, IF PAIN PERSISTS, CALL 911, Disp: 50 tablet, Rfl: 0 .  rosuvastatin (CRESTOR) 5 MG tablet, Take 1 tablet (  5 mg total) by mouth daily., Disp: 90 tablet, Rfl: 2   No Known Allergies    The patient states she uses none for birth control. Last LMP was No LMP recorded. Patient is postmenopausal.. Negative for Dysmenorrhea '@MAMMOFINDINGS' @. Negative for: breast discharge, breast lump(s), breast pain and breast self exam. Associated symptoms include abnormal vaginal bleeding. Pertinent negatives include abnormal bleeding (hematology), anxiety, decreased libido, depression, difficulty falling sleep, dyspareunia,  history of infertility, nocturia, sexual dysfunction, sleep disturbances, urinary incontinence, urinary urgency, vaginal discharge and vaginal itching. Diet regular.The patient states her exercise level is  minimal.  . The patient's tobacco use is:  Social History   Tobacco Use  Smoking Status Never Smoker  Smokeless Tobacco Never Used  . She has been exposed to passive smoke. The patient's alcohol use is:  Social History   Substance and Sexual Activity  Alcohol Use No    Review of Systems  Constitutional: Negative.   HENT: Negative.   Eyes: Negative.  Negative for blurred vision.  Respiratory: Negative.  Negative for shortness of breath.   Cardiovascular: Negative.  Negative for chest pain and palpitations.  Endocrine: Negative.   Genitourinary: Negative.   Musculoskeletal: Negative.   Skin: Negative.   Allergic/Immunologic: Negative.   Neurological: Negative.   Hematological: Negative.   Psychiatric/Behavioral: Negative.      Today's Vitals   09/16/18 1025  BP: 140/78  Pulse: 68  Temp: 98.4 F (36.9 C)  TempSrc: Oral  Weight: 145 lb 12.8 oz (66.1 kg)  Height: 5' 0.6" (1.539 m)  PainSc: 10-Worst pain ever  PainLoc: Hip   Body mass index is 27.91 kg/m.   Objective:  Physical Exam Vitals signs and nursing note reviewed.  Constitutional:      Appearance: Normal appearance.  HENT:     Head: Normocephalic and atraumatic.     Right Ear: Tympanic membrane, ear canal and external ear normal.     Left Ear: Tympanic membrane, ear canal and external ear normal.     Nose: Nose normal.     Mouth/Throat:     Mouth: Mucous membranes are moist.     Pharynx: Oropharynx is clear.  Eyes:     Extraocular Movements: Extraocular movements intact.     Conjunctiva/sclera: Conjunctivae normal.     Pupils: Pupils are equal, round, and reactive to light.  Neck:     Musculoskeletal: Normal range of motion and neck supple.  Cardiovascular:     Rate and Rhythm: Normal rate and  regular rhythm.     Pulses:          Dorsalis pedis pulses are 1+ on the right side and 1+ on the left side.     Heart sounds: Normal heart sounds.  Pulmonary:     Effort: Pulmonary effort is normal.     Breath sounds: Normal breath sounds.  Chest:     Breasts: Tanner Score is 5.        Right: Normal.        Left: Normal.     Comments: Healed surgical scar Abdominal:     General: Abdomen is flat. Bowel sounds are normal.     Palpations: Abdomen is soft.  Genitourinary:    Comments: deferred Musculoskeletal: Normal range of motion.  Feet:     Right foot:     Protective Sensation: 5 sites tested. 5 sites sensed.     Skin integrity: Skin integrity normal.     Toenail Condition: Right toenails are normal.  Left foot:     Protective Sensation: 5 sites tested. 5 sites sensed.     Skin integrity: Skin integrity normal.     Toenail Condition: Left toenails are normal.  Skin:    General: Skin is warm and dry.  Neurological:     General: No focal deficit present.     Mental Status: She is alert and oriented to person, place, and time.  Psychiatric:        Mood and Affect: Mood normal.        Behavior: Behavior normal.         Assessment And Plan:     1. Routine general medical examination at health care facility  A full exam was performed. Importance of monthly self breast exams was discussed with the patient. PATIENT HAS BEEN ADVISED TO GET 30-45 MINUTES REGULAR EXERCISE NO LESS THAN FOUR TO FIVE DAYS PER WEEK - BOTH WEIGHTBEARING EXERCISES AND AEROBIC ARE RECOMMENDED.  SHE IS ADVISED TO FOLLOW A HEALTHY DIET WITH AT LEAST SIX FRUITS/VEGGIES PER DAY, DECREASE INTAKE OF RED MEAT, AND TO INCREASE FISH INTAKE TO TWO DAYS PER WEEK.  MEATS/FISH SHOULD NOT BE FRIED, BAKED OR BROILED IS PREFERABLE.  I SUGGEST WEARING SPF 50 SUNSCREEN ON EXPOSED PARTS AND ESPECIALLY WHEN IN THE DIRECT SUNLIGHT FOR AN EXTENDED PERIOD OF TIME.  PLEASE AVOID FAST FOOD RESTAURANTS AND INCREASE YOUR WATER  INTAKE.   2. Type 2 diabetes mellitus with stage 3 chronic kidney disease, without long-term current use of insulin ([place])  Diabetic foot exam was performed. I DISCUSSED WITH THE PATIENT AT LENGTH REGARDING THE GOALS OF GLYCEMIC CONTROL AND POSSIBLE LONG-TERM COMPLICATIONS.  I  ALSO STRESSED THE IMPORTANCE OF COMPLIANCE WITH HOME GLUCOSE MONITORING, DIETARY RESTRICTIONS INCLUDING AVOIDANCE OF SUGARY DRINKS/PROCESSED FOODS,  ALONG WITH REGULAR EXERCISE.  I  ALSO STRESSED THE IMPORTANCE OF ANNUAL EYE EXAMS, SELF FOOT CARE AND COMPLIANCE WITH OFFICE VISITS.  - CMP14+EGFR - CBC - Lipid panel - Hemoglobin A1c - Referral to Chronic Care Management Services  3. Hypertensive heart and renal disease with renal failure, stage 1 through stage 4 or unspecified chronic kidney disease, without heart failure  Fair control. She will continue with current meds for now. She is encouraged to avoid adding salt to her foods. EKG performed NSR w/o acute changes. She agrees to rto in six months for re-evaluation. She also agrees to [facility] referral. She would like assistance with one of her medications.   - EKG 12-Lead - Referral to Chronic Care Management Services  4. Atherosclerosis of native coronary artery of native heart without angina pectoris  Chronic, yet stable. She is s/p CABG 2003.  Last cath 2012, w/ patent grafts. She is not experiencing angina at this time.  She will continue with medical therapy.   - Referral to Chronic Care Management Services  5. Pure hypercholesterolemia  Chronic. Importance of statin compliance was stressed to the patient. She is also encouraged to limit her fried food intake and to remain as active as possible.   6. Rheumatoid arthritis involving multiple sites, unspecified rheumatoid factor presence (HCC)  Chronic, yet stable.   7. Overweight (BMI 25.0-29.9)  Her weight is stable. She is encouraged to stay active - she will continue to perform all of her ADLs  independently.   [name], MD    THE PATIENT IS ENCOURAGED TO PRACTICE SOCIAL DISTANCING DUE TO THE COVID-19 PANDEMIC.

## 2018-10-07 ENCOUNTER — Other Ambulatory Visit: Payer: Self-pay | Admitting: Nurse Practitioner

## 2018-10-07 DIAGNOSIS — I2510 Atherosclerotic heart disease of native coronary artery without angina pectoris: Secondary | ICD-10-CM

## 2018-10-08 ENCOUNTER — Other Ambulatory Visit: Payer: Self-pay | Admitting: Nurse Practitioner

## 2018-10-08 DIAGNOSIS — I2510 Atherosclerotic heart disease of native coronary artery without angina pectoris: Secondary | ICD-10-CM

## 2018-10-13 ENCOUNTER — Ambulatory Visit: Payer: Self-pay

## 2018-10-13 ENCOUNTER — Telehealth: Payer: Self-pay

## 2018-10-13 DIAGNOSIS — I10 Essential (primary) hypertension: Secondary | ICD-10-CM

## 2018-10-13 DIAGNOSIS — M069 Rheumatoid arthritis, unspecified: Secondary | ICD-10-CM

## 2018-10-13 DIAGNOSIS — E1122 Type 2 diabetes mellitus with diabetic chronic kidney disease: Secondary | ICD-10-CM

## 2018-10-13 NOTE — Chronic Care Management (AMB) (Signed)
  Chronic Care Management   Outreach Note  10/13/2018 Name: Pearla Carandang MRN: [id number] DOB: February 22, 1927  Referred by: Glendale Chard, MD Reason for referral : Chronic Care Management (INITIAL CCM RNCM Telephone Follow up)   An unsuccessful telephone outreach was attempted today. The patient was referred to the case management team by Glendale Chard MD for assistance with chronic care management and care coordination.   Follow Up Plan: Telephone follow up appointment with care management team member scheduled for: 11/05/18    Barb Merino, RN, BSN, CCM Care Management Coordinator [facility]  Direct Phone: [phone]

## 2018-10-21 ENCOUNTER — Other Ambulatory Visit: Payer: Self-pay | Admitting: Nurse Practitioner

## 2018-11-01 ENCOUNTER — Other Ambulatory Visit: Payer: Self-pay

## 2018-11-01 DIAGNOSIS — I10 Essential (primary) hypertension: Secondary | ICD-10-CM

## 2018-11-01 MED ORDER — IRBESARTAN-HYDROCHLOROTHIAZIDE 150-12.5 MG PO TABS: 1.0000 | ORAL_TABLET | Freq: Every day | ORAL | 1 refills | Status: DC

## 2018-11-01 MED ORDER — CARVEDILOL 12.5 MG PO TABS: 12.5000 mg | ORAL_TABLET | Freq: Two times a day (BID) | ORAL | 1 refills | Status: DC

## 2018-11-05 ENCOUNTER — Telehealth: Payer: Self-pay

## 2018-11-10 ENCOUNTER — Ambulatory Visit (INDEPENDENT_AMBULATORY_CARE_PROVIDER_SITE_OTHER): Payer: Medicare Other

## 2018-11-10 DIAGNOSIS — I10 Essential (primary) hypertension: Secondary | ICD-10-CM

## 2018-11-10 DIAGNOSIS — E1122 Type 2 diabetes mellitus with diabetic chronic kidney disease: Secondary | ICD-10-CM | POA: Diagnosis not present

## 2018-11-10 DIAGNOSIS — N183 Chronic kidney disease, stage 3 unspecified: Secondary | ICD-10-CM

## 2018-11-10 DIAGNOSIS — M069 Rheumatoid arthritis, unspecified: Secondary | ICD-10-CM

## 2018-11-11 NOTE — Patient Instructions (Signed)
Visit Information  Goals Addressed      Patient Stated   . "I would like to better manage my diabetes" (pt-stated)       Current Barriers:  [name] Knowledge Deficits related to diabetes disease process and Self Health management   Nurse Case Manager Clinical Goal(s):  [name] Over the next 90 days, patient will work with the CCM team to address needs related to diabetes disease management and lowering her A1C  CCM RN CM Interventions:  11/10/18 call completed with patient   . Evaluation of current treatment plan related to diabetes mellitus and patient's adherence to plan as established by provider. . Provided education to patient re: current A1C of 6.7 obtained on 09/16/18; discussed this is within target range; discussed the nondiabetic target range and discussed ways to reach this goal by adhering to a diabetic friendly diet, taking diabetic medications exactly as prescribed, implementing exercise in her daily routine (discussed ADA recommendations of 150 minutes weekly); discussed patient's average BG runs around 80-112 qd before breakfast; educated patient on s/s of hypo/hyperglycemia and how to respond to readings <80 and or >200  . Reviewed medications with patient and discussed patient is taking Glimepiride 1 mg 1/2 tablet daily w/o noted SE   . Discussed plans with patient for ongoing care management follow up and provided patient with direct contact information for care management team . Provided patient with diabetic educational materials related to "Manage Your Diabetes", "Diabetes Meal Planning" . Advised patient, providing education and rationale, to check cbg before meals daily and record, calling PCP and or CCM team for findings outside established parameters.  , "signs/symptoms of Hypo/Hyperglycemia"  Patient Self Care Activities:  . Self administers medications as prescribed . Attends all scheduled provider appointments . Calls pharmacy for medication refills . Performs ADL's  independently . Performs IADL's independently . Calls provider office for new concerns or questions  Initial goal documentation     . "I would like to continue to stay active despite my arthritic hip pain" (pt-stated)       Current Barriers:  . 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 Limited ROM secondary to having Arthritic pain   Nurse Case Manager Clinical Goal(s):  [name] Over the next 90 days, patient will maintain her ability to perform Self care and stay physically active  CCM RN CM Interventions:  11/10/18 call completed with patient   . Evaluation of current treatment plan related to bilateral hip pain and patient's adherence to plan as established by provider; discussed patient manages this hip pain by staying active within her home by stair climbing and moving around within her home while attending to her daily activities and house chores . Discussed currently patient is satisfied with her treatment plan for management of her arthritis  . Discussed plans with patient for ongoing care management follow up and provided patient with direct contact information for care management team  Patient Self Care Activities:  . Self administers medications as prescribed . Attends all scheduled provider appointments . Calls pharmacy for medication refills . Performs ADL's independently . Performs IADL's independently . Calls provider office for new concerns or questions  Initial goal documentation     . "What is a normal BP" (pt-stated)       Current Barriers:  [name] Knowledge Deficits related to disease process and Self Health management for Hypertension  Nurse Case Manager Clinical Goal(s):  [name] Over the next 60 days, patient will verbalize basic understanding of Hypertension  disease process and  self health management plan as evidenced by patient will log her BP taken at home and will report consistent BP readings of 130/80 or lower  CCM RN CM Interventions:  11/10/18 call completed with patient    . Evaluation of current treatment plan related to Hypertension and patient's adherence to plan as established by provider. . Provided education to patient re: the importance of keeping BP well controlled to help reduce the risk of potential cardiovascular complications and kidney disease; discussed importance of avoiding tobacco products, implementing exercise in her daily routine, getting enough sleep and maintaining body weight within recommended BMI; educated patient on importance of adhering to a low Sodium diet and eating a well balanced heart healthy diet; discussed patient has a BP cuff in her home and feels comfortable monitoring her BP as needed; patient encouraged to monitor her BP routinely and to record her readings alerting the CCM team and or PCP of abnormal readings; discussed target BP 130/80  . Reviewed medications with patient and discussed patient's current pharmacological treatment plan; discussed medication indication, dosage and frequency; discussed patient has a good understanding about each of her medications and is satisfied with her HTN management . Discussed plans with patient for ongoing care management follow up and provided patient with direct contact information for care management team . Provided patient with printed educational materials related to "My BP Log"  Patient Self Care Activities:  . Self administers medications as prescribed . Attends all scheduled provider appointments . Calls pharmacy for medication refills . Performs ADL's independently . Performs IADL's independently . Calls provider office for new concerns or questions  Initial goal documentation        The patient verbalized understanding of instructions provided today and declined a print copy of patient instruction materials.   Telephone follow up appointment with care management team member scheduled for: 12/03/18  Barb Merino, RN, BSN, CCM Care Management Coordinator [place]  [facility]  Direct Phone: [phone]

## 2018-11-11 NOTE — Chronic Care Management (AMB) (Signed)
Chronic Care Management   Initial Visit Note  11/10/2018 Name: [name] MRN: [id number] DOB: 09-13-1927  Referred by: Glendale Chard, MD Reason for referral : Chronic Care Management (#2 [place] [facility] )   [name] is a 85 y.o. year old female who is a primary care patient of Glendale Chard, MD. The CCM team was consulted for assistance with chronic disease management and care coordination needs.   Review of patient status, including review of consultants reports, relevant laboratory and other test results, and collaboration with appropriate care team members and the patient's provider was performed as part of comprehensive patient evaluation and provision of chronic care management services.    SDOH (Social Determinants of Health) screening performed today: None. See Care Plan for related entries.   Advanced Directives Status: N See Care Plan and Vynca application for related entries.   I spoke with [name] by telephone today to assess for CCM RN needs and a plan of care was established.   Medications: Outpatient Encounter Medications as of 11/10/2018  Medication Sig  . amLODipine (NORVASC) 5 MG tablet TAKE 1 TABLET BY MOUTH EVERY DAY  . Calcium Carb-Vit D-Soy Isoflav (CALTRATE 600 + SOY PO) Take by mouth.    . carvedilol (COREG) 12.5 MG tablet Take 1 tablet (12.5 mg total) by mouth 2 (two) times daily with a meal.  . Cholecalciferol (VITAMIN D) 125 MCG (5000 UT) CAPS Take by mouth.  . dipyridamole-aspirin (AGGRENOX) 200-25 MG 12hr capsule TAKE 1 CAPSULE BY MOUTH EVERY DAY  . fish oil-omega-3 fatty acids 1000 MG capsule Take 2 g by mouth daily.    [name] glimepiride (AMARYL) 1 MG tablet TAKE 1/2 TABLET BY MOUTH DAILY  . glucosamine-chondroitin 500-400 MG tablet Take 1 tablet by mouth 3 (three) times daily.    . irbesartan-hydrochlorothiazide (AVALIDE) 150-12.5 MG tablet Take 1 tablet by mouth daily.  . Multiple Vitamin (MULTIVITAMIN) capsule Take 1  capsule by mouth daily.    . naproxen sodium (ALEVE) 220 MG tablet Take 220 mg by mouth.  . nitroGLYCERIN (NITROSTAT) 0.4 MG SL tablet PLACE 1 TABLET UNDER THE TONGUE AT THE 1ST SIGN OF ATTACK, MAY REPEAT EVERY 5 MINUTES FOR 3 DOSES IN 15 MINUTES, IF PAIN PERSISTS, CALL 911  . rosuvastatin (CRESTOR) 5 MG tablet Take 1 tablet (5 mg total) by mouth daily.   No facility-administered encounter medications on file as of 11/10/2018.      Objective:  Lab Results  Component Value Date   HGBA1C 6.7 (H) 09/16/2018   HGBA1C 6.5 (H) 03/16/2018   HGBA1C 7.0 (H) 12/10/2017   Lab Results  Component Value Date   MICROALBUR 150 09/16/2018   LDLCALC 65 09/16/2018   CREATININE 1.67 (H) 09/16/2018   BP Readings from Last 3 Encounters:  09/16/18 140/78  09/16/18 140/78  03/16/18 128/76    Goals Addressed      Patient Stated   . "I would like to better manage my diabetes" (pt-stated)       Current Barriers:  [name] Knowledge Deficits related to diabetes disease process and Self Health management   Nurse Case Manager Clinical Goal(s):  [name] Over the next 90 days, patient will work with the CCM team to address needs related to diabetes disease management and lowering her A1C  CCM RN CM Interventions:  11/10/18 call completed with patient   . Evaluation of current treatment plan related to diabetes mellitus and patient's adherence to plan as established by provider. . Provided education  to patient re: current A1C of 6.7 obtained on 09/16/18; discussed this is within target range; discussed the nondiabetic target range and discussed ways to reach this goal by adhering to a diabetic friendly diet, taking diabetic medications exactly as prescribed, implementing exercise in her daily routine (discussed ADA recommendations of 150 minutes weekly); discussed patient's average BG runs around 80-112 qd before breakfast; educated patient on s/s of hypo/hyperglycemia and how to respond to readings <80 and or >200  .  Reviewed medications with patient and discussed patient is taking Glimepiride 1 mg 1/2 tablet daily w/o noted SE   . Discussed plans with patient for ongoing care management follow up and provided patient with direct contact information for care management team . Provided patient with diabetic educational materials related to "Manage Your Diabetes", "Diabetes Meal Planning" . Advised patient, providing education and rationale, to check cbg before meals daily and record, calling PCP and or CCM team for findings outside established parameters.  , "signs/symptoms of Hypo/Hyperglycemia"  Patient Self Care Activities:  . Self administers medications as prescribed . Attends all scheduled provider appointments . Calls pharmacy for medication refills . Performs ADL's independently . Performs IADL's independently . Calls provider office for new concerns or questions  Initial goal documentation     . "I would like to continue to stay active despite my arthritic hip pain" (pt-stated)       Current Barriers:  . 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 Limited ROM secondary to having Arthritic pain   Nurse Case Manager Clinical Goal(s):  [name] Over the next 90 days, patient will maintain her ability to perform Self care and stay physically active  CCM RN CM Interventions:  11/10/18 call completed with patient   . Evaluation of current treatment plan related to bilateral hip pain and patient's adherence to plan as established by provider; discussed patient manages this hip pain by staying active within her home by stair climbing and moving around within her home while attending to her daily activities and house chores . Discussed currently patient is satisfied with her treatment plan for management of her arthritis  . Discussed plans with patient for ongoing care management follow up and provided patient with direct contact information for care management team  Patient Self Care Activities:  . Self administers  medications as prescribed . Attends all scheduled provider appointments . Calls pharmacy for medication refills . Performs ADL's independently . Performs IADL's independently . Calls provider office for new concerns or questions  Initial goal documentation     . "What is a normal BP" (pt-stated)       Current Barriers:  [name] Knowledge Deficits related to disease process and Self Health management for Hypertension  Nurse Case Manager Clinical Goal(s):  [name] Over the next 60 days, patient will verbalize basic understanding of Hypertension  disease process and self health management plan as evidenced by patient will log her BP taken at home and will report consistent BP readings of 130/80 or lower  CCM RN CM Interventions:  11/10/18 call completed with patient   . Evaluation of current treatment plan related to Hypertension and patient's adherence to plan as established by provider. . Provided education to patient re: the importance of keeping BP well controlled to help reduce the risk of potential cardiovascular complications and kidney disease; discussed importance of avoiding tobacco products, implementing exercise in her daily routine, getting enough sleep and maintaining body weight within recommended BMI; educated patient on importance of adhering to a low  Sodium diet and eating a well balanced heart healthy diet; discussed patient has a BP cuff in her home and feels comfortable monitoring her BP as needed; patient encouraged to monitor her BP routinely and to record her readings alerting the CCM team and or PCP of abnormal readings; discussed target BP 130/80  . Reviewed medications with patient and discussed patient's current pharmacological treatment plan; discussed medication indication, dosage and frequency; discussed patient has a good understanding about each of her medications and is satisfied with her HTN management . Discussed plans with patient for ongoing care management follow up and  provided patient with direct contact information for care management team . Provided patient with printed educational materials related to "My BP Log"  Patient Self Care Activities:  . Self administers medications as prescribed . Attends all scheduled provider appointments . Calls pharmacy for medication refills . Performs ADL's independently . Performs IADL's independently . Calls provider office for new concerns or questions  Initial goal documentation        Telephone follow up appointment with care management team member scheduled for: 12/03/18   Barb Merino, RN, BSN, CCM Care Management Coordinator [place] [facility]  Direct Phone: [phone]

## 2018-12-03 ENCOUNTER — Telehealth: Payer: Self-pay

## 2018-12-06 ENCOUNTER — Telehealth: Payer: Self-pay

## 2018-12-06 ENCOUNTER — Other Ambulatory Visit: Payer: Self-pay

## 2018-12-06 NOTE — Telephone Encounter (Signed)
Please update chart with directions. She may skip meds on Sat/Sun

## 2018-12-06 NOTE — Telephone Encounter (Signed)
The pt was notified that [name] wanted to know if the pt is taking her glimepiride 1 mg tablet daily and the pt said yes..  The pt was asked if she is willing to switch to another medication due to her kidney function she is at risk for hypoglycemia.  The pt said yes she is willing to switch the medication.  The pt was told that [name] said that they can discuss at the pt's next visit but that she suggests the pt immediately cut back to a half tablet and that the pt would need a pill cutter. The pt said she has already been taking a half pill

## 2018-12-06 NOTE — Telephone Encounter (Signed)
The pt was notified that [name] said to take 1/2 tablet of her glimepiride 1 mg Monday - Friday only.

## 2018-12-08 ENCOUNTER — Other Ambulatory Visit: Payer: Self-pay

## 2018-12-08 MED ORDER — GLIMEPIRIDE 1 MG PO TABS: ORAL_TABLET | ORAL | 1 refills | Status: DC

## 2018-12-28 ENCOUNTER — Encounter: Payer: Self-pay | Admitting: Internal Medicine

## 2018-12-28 ENCOUNTER — Other Ambulatory Visit: Payer: Self-pay

## 2018-12-28 ENCOUNTER — Ambulatory Visit: Payer: Medicare Other | Admitting: Internal Medicine

## 2018-12-28 VITALS — BP 138/86 | HR 77 | Temp 98.2°F | Ht 60.2 in | Wt 146.0 lb

## 2018-12-28 DIAGNOSIS — I2510 Atherosclerotic heart disease of native coronary artery without angina pectoris: Secondary | ICD-10-CM | POA: Diagnosis not present

## 2018-12-28 DIAGNOSIS — N183 Chronic kidney disease, stage 3 unspecified: Secondary | ICD-10-CM

## 2018-12-28 DIAGNOSIS — E1122 Type 2 diabetes mellitus with diabetic chronic kidney disease: Secondary | ICD-10-CM

## 2018-12-28 DIAGNOSIS — Z23 Encounter for immunization: Secondary | ICD-10-CM | POA: Diagnosis not present

## 2018-12-28 DIAGNOSIS — I1310 Hypertensive heart and chronic kidney disease without heart failure, with stage 1 through stage 4 chronic kidney disease, or unspecified chronic kidney disease: Secondary | ICD-10-CM | POA: Diagnosis not present

## 2018-12-28 MED ORDER — ROSUVASTATIN CALCIUM 5 MG PO TABS: ORAL_TABLET | ORAL | 2 refills | Status: DC

## 2018-12-28 MED ORDER — AMLODIPINE BESYLATE 5 MG PO TABS: 5.0000 mg | ORAL_TABLET | Freq: Every day | ORAL | 2 refills | Status: DC

## 2018-12-28 MED ORDER — IRBESARTAN-HYDROCHLOROTHIAZIDE 150-12.5 MG PO TABS: 1.0000 | ORAL_TABLET | Freq: Every day | ORAL | 2 refills | Status: DC

## 2018-12-28 MED ORDER — ASPIRIN-DIPYRIDAMOLE ER 25-200 MG PO CP12: 1.0000 | ORAL_CAPSULE | Freq: Every day | ORAL | 1 refills | Status: DC

## 2018-12-28 NOTE — Progress Notes (Signed)
Subjective:     Patient ID: [name] , female    DOB: 1927-05-15 , 85 y.o.   MRN: [id number]   Chief Complaint  Patient presents with  . Diabetes  . Hypertension    HPI  Diabetes She presents for her follow-up diabetic visit. She has type 2 diabetes mellitus. Her disease course has been stable. There are no hypoglycemic associated symptoms. Pertinent negatives for diabetes include no blurred vision and no chest pain. There are no hypoglycemic complications. Risk factors for coronary artery disease include dyslipidemia, hypertension, post-menopausal and diabetes mellitus. Current diabetic treatment includes oral agent (monotherapy).  Hypertension This is a chronic problem. The current episode started more than 1 year ago. The problem has been gradually improving since onset. The problem is controlled. Pertinent negatives include no blurred vision, chest pain, palpitations or shortness of breath.     Past Medical History:  Diagnosis Date  . Cerebrovascular disease, unspecified   . Coronary atherosclerosis of unspecified type of vessel, native or graft   . Diabetes mellitus without complication ([place])   . Other and unspecified hyperlipidemia   . Rheumatoid arthritis(714.0)   . Shingles 2003  . Unspecified essential hypertension      Family History  Problem Relation Age of Onset  . Cancer Mother   . Hypertension Father   . Cancer Brother   . Hypertension Paternal Grandmother   . Heart disease Brother   . Diabetes Neg Hx   . Coronary artery disease Neg Hx      Current Outpatient Medications:  .  amLODipine (NORVASC) 5 MG tablet, Take 1 tablet (5 mg total) by mouth daily., Disp: 90 tablet, Rfl: 2 .  carvedilol (COREG) 12.5 MG tablet, Take 1 tablet (12.5 mg total) by mouth 2 (two) times daily with a meal., Disp: 180 tablet, Rfl: 1 .  dipyridamole-aspirin (AGGRENOX) 200-25 MG 12hr capsule, Take 1 capsule by mouth daily., Disp: 90 capsule, Rfl: 1 .  glimepiride (AMARYL) 1  MG tablet, Take 1/2 tablet by mouth Monday - Friday., Disp: 30 tablet, Rfl: 1 .  glucosamine-chondroitin 500-400 MG tablet, Take 1 tablet by mouth 3 (three) times daily.  , Disp: , Rfl:  .  irbesartan-hydrochlorothiazide (AVALIDE) 150-12.5 MG tablet, Take 1 tablet by mouth daily., Disp: 90 tablet, Rfl: 2 .  Multiple Vitamin (MULTIVITAMIN) capsule, Take 1 capsule by mouth daily.  , Disp: , Rfl:  .  nitroGLYCERIN (NITROSTAT) 0.4 MG SL tablet, PLACE 1 TABLET UNDER THE TONGUE AT THE 1ST SIGN OF ATTACK, MAY REPEAT EVERY 5 MINUTES FOR 3 DOSES IN 15 MINUTES, IF PAIN PERSISTS, CALL 911, Disp: 50 tablet, Rfl: 0 .  rosuvastatin (CRESTOR) 5 MG tablet, One tab po qd M-F, skip Saturday and Sunday, Disp: 90 tablet, Rfl: 2 .  Cholecalciferol (VITAMIN D) 125 MCG (5000 UT) CAPS, Take by mouth., Disp: , Rfl:    No Known Allergies   Review of Systems  Constitutional: Negative.   Eyes: Negative for blurred vision.  Respiratory: Negative.  Negative for shortness of breath.   Cardiovascular: Negative.  Negative for chest pain and palpitations.  Gastrointestinal: Negative.   Neurological: Negative.   Psychiatric/Behavioral: Negative.      Today's Vitals   12/28/18 0952  BP: 138/86  Pulse: 77  Temp: 98.2 F (36.8 C)  TempSrc: Oral  Weight: 146 lb (66.2 kg)  Height: 5' 0.2" (1.529 m)   Body mass index is 28.32 kg/m.   Objective:  Physical Exam Vitals signs and nursing note reviewed.  Constitutional:      Appearance: Normal appearance.  HENT:     Head: Normocephalic and atraumatic.  Cardiovascular:     Rate and Rhythm: Normal rate and regular rhythm.     Heart sounds: Normal heart sounds.  Pulmonary:     Effort: Pulmonary effort is normal.     Breath sounds: Normal breath sounds.  Skin:    General: Skin is warm.  Neurological:     General: No focal deficit present.     Mental Status: She is alert.  Psychiatric:        Mood and Affect: Mood normal.        Behavior: Behavior normal.          Assessment And Plan:     1. Type 2 diabetes mellitus with stage 3 chronic kidney disease, without long-term current use of insulin, unspecified whether stage 3a or 3b CKD (HCC)  Chronic. I spent the bulk of her visit going through each of her medications and supplements. We discussed dosing, timing of administration and indications. All questions were answered to her satisfaction. We discussed the risk of pts w/ CKD taking sulfonylureas. She is currently taking 1/2 glimepiride M-F. She has not had any episodes of hypoglycemia. I plan to further decrease her dose to MWF dosing only, then eventually weaning her completely off of the medication. I will make further recommendations once her labs are available for review. Greater than 50% of face to face time was spent in counseling and coordination of care. I spent more than 25 minutes with the patient. I will also refer her to [facility] pharmacist for medication assistance (Aggrenox).   - CCM Nurse - Hemoglobin A1c - BMP8+EGFR  2. Hypertensive heart and renal disease with renal failure, stage 1 through stage 4 or unspecified chronic kidney disease, without heart failure  Chronic, fair control. She will continue with current meds. A refill of her medication was sent to the pharmacy.   - irbesartan-hydrochlorothiazide (AVALIDE) 150-12.5 MG tablet; Take 1 tablet by mouth daily.  Dispense: 90 tablet; Refill: 2 - CCM Nurse  3. Atherosclerosis of native coronary artery of native heart without angina pectoris  Chronic, yet stable. She is encouraged to avoid fried foods, participate in chair exercises and to follow dietary recommendations.   - dipyridamole-aspirin (AGGRENOX) 200-25 MG 12hr capsule; Take 1 capsule by mouth daily.  Dispense: 90 capsule; Refill: 1  4. Immunization due  - Pneumococcal polysaccharide vaccine 23-valent (for > 47 year old)        [name], MD    THE PATIENT IS ENCOURAGED TO PRACTICE SOCIAL DISTANCING DUE TO  THE COVID-19 PANDEMIC.

## 2018-12-28 NOTE — Patient Instructions (Signed)
Exercises To Do While Sitting  Exercises that you do while sitting (chair exercises) can give you many of the same benefits as full exercise. Benefits include strengthening your heart, burning calories, and keeping muscles and joints healthy. Exercise can also improve your mood and help with depression and anxiety. You may benefit from chair exercises if you are unable to do standing exercises because of:  Diabetic foot pain.  Obesity.  Illness.  Arthritis.  Recovery from surgery or injury.  Breathing problems.  Balance problems.  Another type of disability. Before starting chair exercises, check with your health care provider or a physical therapist to find out how much exercise you can tolerate and which exercises are safe for you. If your health care provider approves:  Start out slowly and build up over time. Aim to work up to about 10-20 minutes for each exercise session.  Make exercise part of your daily routine.  Drink water when you exercise. Do not wait until you are thirsty. Drink every 10-15 minutes.  Stop exercising right away if you have pain, nausea, shortness of breath, or dizziness.  If you are exercising in a wheelchair, make sure to lock the wheels.  Ask your health care provider whether you can do tai chi or yoga. Many positions in these mind-body exercises can be modified to do while seated. Warm-up Before starting other exercises: 1. Sit up as straight as you can. Have your knees bent at 90 degrees, which is the shape of the capital letter "L." Keep your feet flat on the floor. 2. Sit at the front edge of your chair, if you can. 3. Pull in (tighten) the muscles in your abdomen and stretch your spine and neck as straight as you can. Hold this position for a few minutes. 4. Breathe in and out evenly. Try to concentrate on your breathing, and relax your mind. Stretching Exercise A: Arm stretch 1. Hold your arms out straight in front of your body. 2. Bend  your hands at the wrist with your fingers pointing up, as if signaling someone to stop. Notice the slight tension in your forearms as you hold the position. 3. Keeping your arms out and your hands bent, rotate your hands outward as far as you can and hold this stretch. Aim to have your thumbs pointing up and your pinkie fingers pointing down. Slowly repeat arm stretches for one minute as tolerated. Exercise B: Leg stretch 1. If you can move your legs, try to "draw" letters on the floor with the toes of your foot. Write your name with one foot. 2. Write your name with the toes of your other foot. Slowly repeat the movements for one minute as tolerated. Exercise C: Reach for the sky 1. Reach your hands as far over your head as you can to stretch your spine. 2. Move your hands and arms as if you are climbing a rope. Slowly repeat the movements for one minute as tolerated. Range of motion exercises Exercise A: Shoulder roll 1. Let your arms hang loosely at your sides. 2. Lift just your shoulders up toward your ears, then let them relax back down. 3. When your shoulders feel loose, rotate your shoulders in backward and forward circles. Do shoulder rolls slowly for one minute as tolerated. Exercise B: March in place 1. As if you are marching, pump your arms and lift your legs up and down. Lift your knees as high as you can. ? If you are unable to lift your knees,  just pump your arms and move your ankles and feet up and down. March in place for one minute as tolerated. Exercise C: Seated jumping jacks 1. Let your arms hang down straight. 2. Keeping your arms straight, lift them up over your head. Aim to point your fingers to the ceiling. 3. While you lift your arms, straighten your legs and slide your heels along the floor to your sides, as wide as you can. 4. As you bring your arms back down to your sides, slide your legs back together. ? If you are unable to use your legs, just move your arms.  Slowly repeat seated jumping jacks for one minute as tolerated. Strengthening exercises Exercise A: Shoulder squeeze 1. Hold your arms straight out from your body to your sides, with your elbows bent and your fists pointed at the ceiling. 2. Keeping your arms in the bent position, move them forward so your elbows and forearms meet in front of your face. 3. Open your arms back out as wide as you can with your elbows still bent, until you feel your shoulder blades squeezing together. Hold for 5 seconds. Slowly repeat the movements forward and backward for one minute as tolerated. Contact a health care provider if you:  Had to stop exercising due to any of the following: ? Pain. ? Nausea. ? Shortness of breath. ? Dizziness. ? Fatigue.  Have significant pain or soreness after exercising. Get help right away if you have:  Chest pain.  Difficulty breathing. These symptoms may represent a serious problem that is an emergency. Do not wait to see if the symptoms will go away. Get medical help right away. Call your local emergency services (911 in the [place] Do not drive yourself to the hospital. This information is not intended to replace advice given to you by your health care provider. Make sure you discuss any questions you have with your health care provider. Document Released: 12/17/2016 Document Revised: 05/27/2018 Document Reviewed: 12/17/2016 Elsevier Patient Education  2020 [employer].

## 2018-12-29 LAB — HEMOGLOBIN A1C
Est. average glucose Bld gHb Est-mCnc: 148 mg/dL
Hgb A1c MFr Bld: 6.8 % — ABNORMAL HIGH (ref 4.8–5.6)

## 2018-12-29 LAB — BMP8+EGFR
BUN/Creatinine Ratio: 30 — ABNORMAL HIGH (ref 12–28)
BUN: 54 mg/dL — ABNORMAL HIGH (ref 8–27)
CO2: 18 mmol/L — ABNORMAL LOW (ref 20–29)
Calcium: 9.9 mg/dL (ref 8.7–10.3)
Chloride: 103 mmol/L (ref 96–106)
Creatinine, Ser: 1.78 mg/dL — ABNORMAL HIGH (ref 0.57–1.00)
GFR calc Af Amer: 29 mL/min/{1.73_m2} — ABNORMAL LOW (ref >59)
GFR calc non Af Amer: 25 mL/min/{1.73_m2} — ABNORMAL LOW (ref >59)
Glucose: 137 mg/dL — ABNORMAL HIGH (ref 65–99)
Potassium: 5.1 mmol/L (ref 3.5–5.2)
Sodium: 139 mmol/L (ref 134–144)

## 2018-12-31 ENCOUNTER — Telehealth: Payer: Self-pay

## 2018-12-31 ENCOUNTER — Other Ambulatory Visit: Payer: Self-pay

## 2018-12-31 MED ORDER — GLIMEPIRIDE 1 MG PO TABS: ORAL_TABLET | ORAL | 1 refills | Status: DC

## 2018-12-31 NOTE — Telephone Encounter (Signed)
Left the patient a message to call back for lab results. 

## 2018-12-31 NOTE — Telephone Encounter (Signed)
-----   Message from Glendale Chard, MD sent at 12/30/2018  8:24 PM EST ----- Your hba1c is great at 6.8.  I would like for you to cut back your glimepiride to 1/2 tab on MWF only. You may send her a refill with same directions #36/2 refills. You rkidney fxn has decreased slightly. Be sure to stay well hydrated. If persistent, we will need to stop the hctz that is in your bp medication, irbesartan.

## 2019-01-03 ENCOUNTER — Ambulatory Visit: Payer: Self-pay | Admitting: Pharmacist

## 2019-01-03 DIAGNOSIS — I1310 Hypertensive heart and chronic kidney disease without heart failure, with stage 1 through stage 4 chronic kidney disease, or unspecified chronic kidney disease: Secondary | ICD-10-CM

## 2019-01-03 DIAGNOSIS — N183 Chronic kidney disease, stage 3 unspecified: Secondary | ICD-10-CM

## 2019-01-06 NOTE — Patient Instructions (Signed)
Visit Information  Goals Addressed            This Visit's Progress     Patient Stated   . My aggrenox is expensive (pt-stated)       Current Barriers:  . Financial Barriers: patient has [facility] insurance and reports copay for Aggrenox (generic) is cost prohibitive at this time  Pharmacist Clinical Goal(s):  [name] Over the next 30 days, patient will work with PharmD and providers to relieve medication access concerns  Interventions: . Comprehensive medication review completed; medication list updated in electronic medical record.  . Call placed to pharmacy.  Patient is in the coverage gap and paying over $100 month for generic Aggrenox . Outreach to PharmD and [facility] to investigate if patient should remain on Aggrenox or is there an alternative agent we can use.  It appears patient has been on this drug since 2015 (stroke).  Will continue to follow.  Patient Self Care Activities:  . Patient will provide necessary portions of application   Initial goal documentation        The patient verbalized understanding of instructions provided today and declined a print copy of patient instruction materials.   The care management team will reach out to the patient again over the next 30 days.   SIGNATURE Regina Eck, PharmD, BCPS Clinical Pharmacist, [facility] [place]: [phone]

## 2019-01-06 NOTE — Progress Notes (Signed)
  Chronic Care Management   Initial Visit Note  01/03/2019 Name: [name] MRN: [id number] DOB: 02-26-1927  Referred by: Glendale Chard, MD Reason for referral : Chronic Care Management  [name] is a 85 y.o. year old female who is a primary care patient of Glendale Chard, MD. The CCM team was consulted for assistance with chronic disease management and care coordination needs related to DMII  Review of patient status, including review of consultants reports, relevant laboratory and other test results, and collaboration with appropriate care team members and the patient's provider was performed as part of comprehensive patient evaluation and provision of chronic care management services.    Medications: Outpatient Encounter Medications as of 01/03/2019  Medication Sig  . amLODipine (NORVASC) 5 MG tablet Take 1 tablet (5 mg total) by mouth daily.  . carvedilol (COREG) 12.5 MG tablet Take 1 tablet (12.5 mg total) by mouth 2 (two) times daily with a meal.  . Cholecalciferol (VITAMIN D) 125 MCG (5000 UT) CAPS Take by mouth.  . dipyridamole-aspirin (AGGRENOX) 200-25 MG 12hr capsule Take 1 capsule by mouth daily.  [name] glimepiride (AMARYL) 1 MG tablet Take 1/2 tablet by mouth Monday, Wednesday, Friday  . glucosamine-chondroitin 500-400 MG tablet Take 1 tablet by mouth 3 (three) times daily.    . irbesartan-hydrochlorothiazide (AVALIDE) 150-12.5 MG tablet Take 1 tablet by mouth daily.  . Multiple Vitamin (MULTIVITAMIN) capsule Take 1 capsule by mouth daily.    . nitroGLYCERIN (NITROSTAT) 0.4 MG SL tablet PLACE 1 TABLET UNDER THE TONGUE AT THE 1ST SIGN OF ATTACK, MAY REPEAT EVERY 5 MINUTES FOR 3 DOSES IN 15 MINUTES, IF PAIN PERSISTS, CALL 911  . rosuvastatin (CRESTOR) 5 MG tablet One tab po qd M-F, skip Saturday and Sunday   No facility-administered encounter medications on file as of 01/03/2019.      Objective:   Goals Addressed            This Visit's Progress     Patient  Stated   . My aggrenox is expensive (pt-stated)       Current Barriers:  . Financial Barriers: patient has [facility] insurance and reports copay for Aggrenox (generic) is cost prohibitive at this time  Pharmacist Clinical Goal(s):  [name] Over the next 30 days, patient will work with PharmD and providers to relieve medication access concerns  Interventions: . Comprehensive medication review completed; medication list updated in electronic medical record.  . Call placed to pharmacy.  Patient is in the coverage gap and paying over $100 month for generic Aggrenox . Outreach to PharmD and [facility] to investigate if patient should remain on Aggrenox or is there an alternative agent we can use.  It appears patient has been on this drug since 2015 (stroke).  Will continue to follow.  Patient Self Care Activities:  . Patient will provide necessary portions of application   Initial goal documentation         Plan:   The care management team will reach out to the patient again over the next 30 days.   Provider Signature  Regina Eck, PharmD, BCPS Clinical Pharmacist, [facility] [place]: [phone]

## 2019-01-10 ENCOUNTER — Telehealth: Payer: Self-pay

## 2019-01-10 ENCOUNTER — Ambulatory Visit (INDEPENDENT_AMBULATORY_CARE_PROVIDER_SITE_OTHER): Payer: Medicare Other

## 2019-01-10 DIAGNOSIS — E1122 Type 2 diabetes mellitus with diabetic chronic kidney disease: Secondary | ICD-10-CM | POA: Diagnosis not present

## 2019-01-10 DIAGNOSIS — M069 Rheumatoid arthritis, unspecified: Secondary | ICD-10-CM | POA: Diagnosis not present

## 2019-01-10 DIAGNOSIS — N183 Chronic kidney disease, stage 3 unspecified: Secondary | ICD-10-CM

## 2019-01-10 DIAGNOSIS — I10 Essential (primary) hypertension: Secondary | ICD-10-CM

## 2019-01-10 NOTE — Chronic Care Management (AMB) (Signed)
  Chronic Care Management   Outreach Note  01/10/2019 Name: [name] MRN: [id number] DOB: 07/11/1927  Referred by: Glendale Chard, MD Reason for referral : Chronic Care Management (CCM RNCM Telephone Follow up)   An unsuccessful telephone outreach was attempted today. The patient was referred to the case management team by Glendale Chard MD for assistance with care management and care coordination.   Follow Up Plan: A HIPPA compliant phone message was left for the patient providing contact information and requesting a return call.  Telephone follow up appointment with care management team member scheduled for: 02/21/19  Barb Merino, RN, BSN, CCM Care Management Coordinator [facility]  Direct Phone: [phone]

## 2019-01-11 ENCOUNTER — Ambulatory Visit: Payer: Self-pay | Admitting: Pharmacist

## 2019-01-11 ENCOUNTER — Telehealth: Payer: Self-pay

## 2019-01-11 DIAGNOSIS — N183 Chronic kidney disease, stage 3 unspecified: Secondary | ICD-10-CM

## 2019-01-11 DIAGNOSIS — I10 Essential (primary) hypertension: Secondary | ICD-10-CM

## 2019-01-11 NOTE — Progress Notes (Signed)
  Chronic Care Management   Outreach Note  01/11/2019 Name: [name] MRN: [id number] DOB: 12-Sep-1927  Referred by: Glendale Chard, MD Reason for referral : Chronic Care Management   An unsuccessful telephone outreach was attempted today. The patient was referred to the case management team by for assistance with care management and care coordination.   Follow Up Plan: The care management team will reach out to the patient again over the next 7-10 days.   SIGNATURE Regina Eck, PharmD, BCPS Clinical Pharmacist, [facility] [place]: [phone]

## 2019-01-11 NOTE — Telephone Encounter (Signed)
LVM for pt to call the office  Per [name] does pt see specialist for rheumatoid arthritis?

## 2019-01-12 ENCOUNTER — Ambulatory Visit: Payer: Self-pay | Admitting: Pharmacist

## 2019-01-12 DIAGNOSIS — E1122 Type 2 diabetes mellitus with diabetic chronic kidney disease: Secondary | ICD-10-CM

## 2019-01-12 DIAGNOSIS — I10 Essential (primary) hypertension: Secondary | ICD-10-CM

## 2019-01-17 NOTE — Progress Notes (Signed)
Chronic Care Management   Visit Note  01/12/2019 Name: Abigayil Wellons MRN: [id number] DOB: Jul 16, 1927  Referred by: Glendale Chard, MD Reason for referral : Chronic Care Management   [name] is a 85 y.o. year old female who is a primary care patient of Glendale Chard, MD. The CCM team was consulted for assistance with chronic disease management and care coordination needs related to HTN, HLD and DMII  Review of patient status, including review of consultants reports, relevant laboratory and other test results, and collaboration with appropriate care team members and the patient's provider was performed as part of comprehensive patient evaluation and provision of chronic care management services.    I spoke with [name] by telephone today.  Medications: Outpatient Encounter Medications as of 01/12/2019  Medication Sig  . amLODipine (NORVASC) 5 MG tablet Take 1 tablet (5 mg total) by mouth daily.  . carvedilol (COREG) 12.5 MG tablet Take 1 tablet (12.5 mg total) by mouth 2 (two) times daily with a meal.  . Cholecalciferol (VITAMIN D) 125 MCG (5000 UT) CAPS Take by mouth.  . dipyridamole-aspirin (AGGRENOX) 200-25 MG 12hr capsule Take 1 capsule by mouth daily.  [name] glimepiride (AMARYL) 1 MG tablet Take 1/2 tablet by mouth Monday, Wednesday, Friday  . glucosamine-chondroitin 500-400 MG tablet Take 1 tablet by mouth 3 (three) times daily.    . irbesartan-hydrochlorothiazide (AVALIDE) 150-12.5 MG tablet Take 1 tablet by mouth daily.  . Multiple Vitamin (MULTIVITAMIN) capsule Take 1 capsule by mouth daily.    . nitroGLYCERIN (NITROSTAT) 0.4 MG SL tablet PLACE 1 TABLET UNDER THE TONGUE AT THE 1ST SIGN OF ATTACK, MAY REPEAT EVERY 5 MINUTES FOR 3 DOSES IN 15 MINUTES, IF PAIN PERSISTS, CALL 911  . rosuvastatin (CRESTOR) 5 MG tablet One tab po qd M-F, skip Saturday and Sunday   No facility-administered encounter medications on file as of 01/12/2019.      Objective:   Goals  Addressed            This Visit's Progress     Patient Stated   . "I would like to better manage my diabetes" (pt-stated)       Current Barriers:  [name] Knowledge Deficits related to diabetes disease process and Self Health management   Nurse Case Manager & PharmD Clinical Goal(s):  [name] Over the next 90 days, patient will work with the CCM team to address needs related to diabetes disease management and lowering her A1C  CCM PharmD Interventions:  01/12/19 call completed with patient  . T2DM, last A1c was 6.8% on 12/28/18 . Patient remains on glimepiride 1/2 tablet on M, W, F; denies hypoglycemia o Glimepiride not ideal in 89yoF due to risk of hypglycemia o Counseled on hypoglycemia and how to react to Bg<70 o Patient does not wish to change regimen at this time.  Will continue for now . FBG 100s-120s . She is taking an ARB and statin (M-F only)  Patient Self Care Activities:  . Self administers medications as prescribed . Attends all scheduled provider appointments . Calls pharmacy for medication refills . Performs ADL's independently . Performs IADL's independently . Calls provider office for new concerns or questions  Please see past updates related to this goal by clicking on the "Past Updates" button in the selected goal      . My aggrenox is expensive (pt-stated)       Current Barriers:  . Financial Barriers: patient has [facility] insurance and reports copay for Aggrenox (generic) is cost prohibitive  at this time  Pharmacist Clinical Goal(s):  [name] Over the next 30 days, patient will work with PharmD and providers to relieve medication access concerns  Interventions: Completed call with patient on 01/12/19 . Comprehensive medication review completed; medication list updated in electronic medical record.  . Call placed to pharmacy.  Patient is in the coverage gap and paying over $100 month for generic Aggrenox . Outreach to PharmD at [facility] to investigate if patient  should remain on Aggrenox or is there an alternative agent we can use.  It appears patient has been on this drug since 2015 (stroke).  Patient is tolerating this medication and denies adverse events.   . Encouraged patient to ask about Aggrenox at upcoming cardiology visit on 01/24/19 . Will continue to follow  Patient Self Care Activities:  . Patient will provide necessary portions of application   Please see past updates related to this goal by clicking on the "Past Updates" button in the selected goal         Plan:   The care management team will reach out to the patient again over the next 30 days.   Provider Signature Regina Eck, PharmD, BCPS Clinical Pharmacist, [facility] [place]: [phone]

## 2019-01-17 NOTE — Patient Instructions (Signed)
Visit Information  Goals Addressed            This Visit's Progress     Patient Stated   . My aggrenox is expensive (pt-stated)       Current Barriers:  . Financial Barriers: patient has [facility] insurance and reports copay for Aggrenox (generic) is cost prohibitive at this time  Pharmacist Clinical Goal(s):  [name] Over the next 30 days, patient will work with PharmD and providers to relieve medication access concerns  Interventions: Completed call with patient on 01/12/19 . Comprehensive medication review completed; medication list updated in electronic medical record.  . Call placed to pharmacy.  Patient is in the coverage gap and paying over $100 month for generic Aggrenox . Outreach to PharmD at [facility] to investigate if patient should remain on Aggrenox or is there an alternative agent we can use.  It appears patient has been on this drug since 2015 (stroke).  Patient is tolerating this medication and denies adverse events.   . Encouraged patient to ask about Aggrenox at upcoming cardiology visit on 01/24/19 . Will continue to follow  Patient Self Care Activities:  . Patient will provide necessary portions of application   Please see past updates related to this goal by clicking on the "Past Updates" button in the selected goal         The patient verbalized understanding of instructions provided today and declined a print copy of patient instruction materials.   The care management team will reach out to the patient again over the next 30 days.   SIGNATURE Regina Eck, PharmD, BCPS Clinical Pharmacist, [facility] [place]: [phone]

## 2019-01-19 ENCOUNTER — Telehealth: Payer: Self-pay

## 2019-01-24 NOTE — Progress Notes (Signed)
Patient ID: [name], female   DOB: [birth year], 85 y.o.   MRN: [id number]     Cardiology Office Note   Date:  01/26/2019   ID:  [name], DOB [birth year], MRN [id number]  PCP:  Glendale Chard, MD  Cardiologist:   Jenkins Rouge, MD   No chief complaint on file.     History of Present Illness: [name] is a 85 y.o. female who presents for f/u  of CAD. Distant history of CABG complicated by CVA.  Last cath 06/2010 with patent grafts diffuse distal LAD disease   SVG OM/D1 sequential SVG PDA LIMA to LAD  EF normal at that time    CRF;s HTN and elevated lipids on Rx  Has been on Aggrenox since stroke 09/08/13 Carotid 12/08/17  plaque no stenosis  Living independently  Ambulates with cain.  DM under good control   No longer driving . Living independently has one son living in [place] and one in [place]   No angina active walking mild edema in RLE    Mostly limited by arthritis on right side of her body   Past Medical History:  Diagnosis Date  . Cerebrovascular disease, unspecified   . Coronary atherosclerosis of unspecified type of vessel, native or graft   . Diabetes mellitus without complication ([place])   . Other and unspecified hyperlipidemia   . Rheumatoid arthritis(714.0)   . Shingles 2003  . Unspecified essential hypertension     Past Surgical History:  Procedure Laterality Date  . Cardiac Bypass  2003  . CATARACT EXTRACTION, BILATERAL  2007     Current Outpatient Medications  Medication Sig Dispense Refill  . amLODipine (NORVASC) 5 MG tablet Take 1 tablet (5 mg total) by mouth daily. 90 tablet 2  . carvedilol (COREG) 12.5 MG tablet Take 1 tablet (12.5 mg total) by mouth 2 (two) times daily with a meal. 180 tablet 1  . Cholecalciferol (VITAMIN D) 125 MCG (5000 UT) CAPS Take by mouth.    . dipyridamole-aspirin (AGGRENOX) 200-25 MG 12hr capsule Take 1 capsule by mouth daily. 90 capsule 1  . glimepiride (AMARYL) 1 MG tablet Take 1/2 tablet by mouth  Monday, Wednesday, Friday 36 tablet 1  . glucosamine-chondroitin 500-400 MG tablet Take 1 tablet by mouth 3 (three) times daily.      . irbesartan-hydrochlorothiazide (AVALIDE) 150-12.5 MG tablet Take 1 tablet by mouth daily. 90 tablet 2  . Multiple Vitamin (MULTIVITAMIN) capsule Take 1 capsule by mouth daily.      . nitroGLYCERIN (NITROSTAT) 0.4 MG SL tablet PLACE 1 TABLET UNDER THE TONGUE AT THE 1ST SIGN OF ATTACK, MAY REPEAT EVERY 5 MINUTES FOR 3 DOSES IN 15 MINUTES, IF PAIN PERSISTS, CALL 911 50 tablet 0  . rosuvastatin (CRESTOR) 5 MG tablet One tab po qd M-F, skip Saturday and Sunday 90 tablet 2   No current facility-administered medications for this visit.     Allergies:   Patient has no known allergies.    Social History:  The patient  reports that she has never smoked. She has never used smokeless tobacco. She reports that she does not drink alcohol or use drugs.   Family History:  The patient's family history includes Cancer in her brother and mother; Heart disease in her brother; Hypertension in her father and paternal grandmother.    ROS:  Please see the history of present illness.   Otherwise, review of systems are positive for none.   All other systems are reviewed and negative.  PHYSICAL EXAM: VS:  BP (!) 150/60   Pulse 70   Ht 5' (1.524 m)   Wt 148 lb (67.1 kg)   SpO2 99%   BMI 28.90 kg/m  , BMI Body mass index is 28.9 kg/m. Affect appropriate Elderly female  HEENT: normal Neck supple with no adenopathy JVP normal no bruits no thyromegaly Lungs clear with no wheezing and good diaphragmatic motion Heart:  S1/S2 no murmur, no rub, gallop or click PMI normal post sternotomy  Abdomen: benighn, BS positve, no tenderness, no AAA no bruit.  No HSM or HJR Distal pulses intact with no bruits Plus one RLE edema Neuro non-focal Skin warm and dry No muscular weakness    EKG:   10/25/13  SR rate 68 normal ECG  11/02/14  SR rate 65 nonspecific ST changes essentially  normal  12/17/16 SR rate 66 nonspecific ST changes  01/25/18 SR rate 66 normal   Recent Labs: 09/16/2018: ALT 9; Hemoglobin 11.5; Platelets 222 12/28/2018: BUN 54; Creatinine, Ser 1.78; Potassium 5.1; Sodium 139    Lipid Panel    Component Value Date/Time   CHOL 119 09/16/2018 1233   TRIG 101 09/16/2018 1233   HDL 34 (L) 09/16/2018 1233   CHOLHDL 3.5 09/16/2018 1233   LDLCALC 65 09/16/2018 1233      Wt Readings from Last 3 Encounters:  01/26/19 148 lb (67.1 kg)  12/28/18 146 lb (66.2 kg)  09/16/18 145 lb 12.8 oz (66.1 kg)      Other studies Reviewed: Additional studies/ records that were reviewed today include: Epic notes and old CABG report .    ASSESSMENT AND PLAN:  1. CAD/CABG: CABG 2003 with patent grafts on cath 2012 no angina continue medical Rx 2. Carotid:  Duplex 12/08/17  plaque no stenosis f/u duplex October 2021 3. HTN:.  Well controlled.  Continue current medications and low sodium Dash type diet.   4. DM:  Discussed low carb diet.  Target hemoglobin A1c is 6.5 or less.  Continue current medications. 5. Chol on crestor labs with primary 6. Edema:  RLE from SVG harvest chronic Has diuretic on board   Current medicines are reviewed at length with the patient today.  The patient does not have concerns regarding medicines.  The following changes have been made:  no change  Labs/ tests ordered today include:   None     No orders of the defined types were placed in this encounter.    Disposition:   FU with me in a year      Signed, Jenkins Rouge, MD  01/26/2019 10:07 AM    [address], [place], [place]  [address] Phone: [phone]; Fax: [phone]

## 2019-01-26 ENCOUNTER — Encounter: Payer: Self-pay | Admitting: Cardiovascular Disease

## 2019-01-26 ENCOUNTER — Other Ambulatory Visit: Payer: Self-pay

## 2019-01-26 ENCOUNTER — Ambulatory Visit: Payer: Medicare Other | Admitting: Cardiovascular Disease

## 2019-01-26 VITALS — BP 150/60 | HR 70 | Ht 60.0 in | Wt 148.0 lb

## 2019-01-26 DIAGNOSIS — Z951 Presence of aortocoronary bypass graft: Secondary | ICD-10-CM

## 2019-01-26 NOTE — Patient Instructions (Addendum)
Medication Instructions:   *If you need a refill on your cardiac medications before your next appointment, please call your pharmacy*  Lab Work:  If you have labs (blood work) drawn today and your tests are completely normal, you will receive your results only by: [name] MyChart Message (if you have MyChart) OR . A paper copy in the mail If you have any lab test that is abnormal or we need to change your treatment, we will call you to review the results.  Follow-Up: At [facility], you and your health needs are our priority.  As part of our continuing mission to provide you with exceptional heart care, we have created designated Provider Care Teams.  These Care Teams include your primary Cardiologist (physician) and Advanced Practice Providers (APPs -  Physician Assistants and Nurse Practitioners) who all work together to provide you with the care you need, when you need it.  Your next appointment:   1 year(s)  The format for your next appointment:   In Person  Provider:   You may see Jenkins Rouge, MD or one of the following Advanced Practice Providers on your designated Care Team:    Truitt Merle, NP  Cecilie Kicks, NP  Kathyrn Drown, NP

## 2019-01-28 ENCOUNTER — Telehealth: Payer: Self-pay

## 2019-02-07 ENCOUNTER — Other Ambulatory Visit: Payer: Self-pay | Admitting: Nurse Practitioner

## 2019-02-07 DIAGNOSIS — I2510 Atherosclerotic heart disease of native coronary artery without angina pectoris: Secondary | ICD-10-CM

## 2019-02-15 ENCOUNTER — Ambulatory Visit: Payer: Self-pay

## 2019-02-15 DIAGNOSIS — N183 Chronic kidney disease, stage 3 unspecified: Secondary | ICD-10-CM

## 2019-02-15 NOTE — Chronic Care Management (AMB) (Signed)
  Chronic Care Management   Outreach Note  02/15/2019 Name: [name] MRN: [id number] DOB: [birth year]  Referred by: Glendale Chard, MD Reason for referral : Care Coordination   SW placed an outbound call to the patients home to assist with care coordination needs. SW informed by the patients caregiver, the patient was unavailable to complete today's call. SW informed the patients caregiver a member of the case management team would follow up with the patient over the next 30 days.  Follow Up Plan: The care management team will reach out to the patient again over the next 30 days.   Daneen Schick, BSW, CDP Social Worker, Certified Dementia Practitioner [place] [facility] [place] [facility] [phone]

## 2019-02-21 ENCOUNTER — Telehealth: Payer: Self-pay

## 2019-03-05 ENCOUNTER — Other Ambulatory Visit: Payer: Self-pay | Admitting: Internal Medicine

## 2019-03-16 ENCOUNTER — Telehealth: Payer: Self-pay

## 2019-03-24 DIAGNOSIS — Z1231 Encounter for screening mammogram for malignant neoplasm of breast: Secondary | ICD-10-CM | POA: Diagnosis not present

## 2019-03-24 LAB — HM MAMMOGRAPHY

## 2019-04-12 ENCOUNTER — Encounter: Payer: Self-pay | Admitting: Internal Medicine

## 2019-04-20 ENCOUNTER — Telehealth: Payer: Self-pay | Admitting: Pharmacist

## 2019-04-22 ENCOUNTER — Telehealth: Payer: Self-pay

## 2019-04-23 DIAGNOSIS — E119 Type 2 diabetes mellitus without complications: Secondary | ICD-10-CM | POA: Diagnosis not present

## 2019-04-27 ENCOUNTER — Other Ambulatory Visit: Payer: Self-pay | Admitting: Internal Medicine

## 2019-04-27 DIAGNOSIS — I10 Essential (primary) hypertension: Secondary | ICD-10-CM

## 2019-04-28 ENCOUNTER — Other Ambulatory Visit: Payer: Self-pay

## 2019-04-28 ENCOUNTER — Encounter: Payer: Self-pay | Admitting: Internal Medicine

## 2019-04-28 ENCOUNTER — Ambulatory Visit: Payer: Medicare PPO | Admitting: Internal Medicine

## 2019-04-28 VITALS — BP 128/66 | HR 77 | Temp 98.6°F | Ht 61.0 in | Wt 140.8 lb

## 2019-04-28 DIAGNOSIS — M069 Rheumatoid arthritis, unspecified: Secondary | ICD-10-CM

## 2019-04-28 DIAGNOSIS — I1310 Hypertensive heart and chronic kidney disease without heart failure, with stage 1 through stage 4 chronic kidney disease, or unspecified chronic kidney disease: Secondary | ICD-10-CM

## 2019-04-28 DIAGNOSIS — M545 Low back pain, unspecified: Secondary | ICD-10-CM

## 2019-04-28 DIAGNOSIS — G8929 Other chronic pain: Secondary | ICD-10-CM | POA: Diagnosis not present

## 2019-04-28 DIAGNOSIS — E1122 Type 2 diabetes mellitus with diabetic chronic kidney disease: Secondary | ICD-10-CM | POA: Diagnosis not present

## 2019-04-28 DIAGNOSIS — N183 Chronic kidney disease, stage 3 unspecified: Secondary | ICD-10-CM | POA: Diagnosis not present

## 2019-04-28 DIAGNOSIS — M25551 Pain in right hip: Secondary | ICD-10-CM | POA: Diagnosis not present

## 2019-04-28 DIAGNOSIS — I2510 Atherosclerotic heart disease of native coronary artery without angina pectoris: Secondary | ICD-10-CM

## 2019-04-28 NOTE — Patient Instructions (Signed)
Hip Pain The hip is the joint between the upper legs and the lower pelvis. The bones, cartilage, tendons, and muscles of your hip joint support your body and allow you to move around. Hip pain can range from a minor ache to severe pain in one or both of your hips. The pain may be felt on the inside of the hip joint near the groin, or on the outside near the buttocks and upper thigh. You may also have swelling or stiffness in your hip area. Follow these instructions at home: Managing pain, stiffness, and swelling      If directed, put ice on the painful area. To do this: ? Put ice in a plastic bag. ? Place a towel between your skin and the bag. ? Leave the ice on for 20 minutes, 2-3 times a day.  If directed, apply heat to the affected area as often as told by your health care provider. Use the heat source that your health care provider recommends, such as a moist heat pack or a heating pad. ? Place a towel between your skin and the heat source. ? Leave the heat on for 20-30 minutes. ? Remove the heat if your skin turns bright red. This is especially important if you are unable to feel pain, heat, or cold. You may have a greater risk of getting burned. Activity  Do exercises as told by your health care provider.  Avoid activities that cause pain. General instructions   Take over-the-counter and prescription medicines only as told by your health care provider.  Keep a journal of your symptoms. Write down: ? How often you have hip pain. ? The location of your pain. ? What the pain feels like. ? What makes the pain worse.  Sleep with a pillow between your legs on your most comfortable side.  Keep all follow-up visits as told by your health care provider. This is important. Contact a health care provider if:  You cannot put weight on your leg.  Your pain or swelling continues or gets worse after one week.  It gets harder to walk.  You have a fever. Get help right away  if:  You fall.  You have a sudden increase in pain and swelling in your hip.  Your hip is red or swollen or very tender to touch. Summary  Hip pain can range from a minor ache to severe pain in one or both of your hips.  The pain may be felt on the inside of the hip joint near the groin, or on the outside near the buttocks and upper thigh.  Avoid activities that cause pain.  Write down how often you have hip pain, the location of the pain, what makes it worse, and what it feels like. This information is not intended to replace advice given to you by your health care provider. Make sure you discuss any questions you have with your health care provider. Document Revised: 06/21/2018 Document Reviewed: 06/21/2018 Elsevier Patient Education  2020 Elsevier Inc. -- 

## 2019-04-29 LAB — CMP14+EGFR
ALT: 9 IU/L (ref 0–32)
AST: 18 IU/L (ref 0–40)
Albumin/Globulin Ratio: 1.3 (ref 1.2–2.2)
Albumin: 3.9 g/dL (ref 3.6–4.6)
Alkaline Phosphatase: 78 IU/L (ref 39–117)
BUN/Creatinine Ratio: 26 (ref 12–28)
BUN: 48 mg/dL — ABNORMAL HIGH (ref 8–27)
Bilirubin Total: <0.2 mg/dL (ref 0.0–1.2)
CO2: 24 mmol/L (ref 20–29)
Calcium: 9.8 mg/dL (ref 8.7–10.3)
Chloride: 104 mmol/L (ref 96–106)
Creatinine, Ser: 1.87 mg/dL — ABNORMAL HIGH (ref 0.57–1.00)
GFR calc Af Amer: 27 mL/min/{1.73_m2} — ABNORMAL LOW (ref >59)
GFR calc non Af Amer: 23 mL/min/{1.73_m2} — ABNORMAL LOW (ref >59)
Globulin, Total: 2.9 g/dL (ref 1.5–4.5)
Glucose: 122 mg/dL — ABNORMAL HIGH (ref 65–99)
Potassium: 5.3 mmol/L — ABNORMAL HIGH (ref 3.5–5.2)
Sodium: 142 mmol/L (ref 134–144)
Total Protein: 6.8 g/dL (ref 6.0–8.5)

## 2019-04-29 LAB — LIPID PANEL
Chol/HDL Ratio: 4.4 ratio (ref 0.0–4.4)
Cholesterol, Total: 118 mg/dL (ref 100–199)
HDL: 27 mg/dL — ABNORMAL LOW (ref >39)
LDL Chol Calc (NIH): 64 mg/dL (ref 0–99)
Triglycerides: 154 mg/dL — ABNORMAL HIGH (ref 0–149)
VLDL Cholesterol Cal: 27 mg/dL (ref 5–40)

## 2019-04-29 LAB — RHEUMATOID FACTOR: Rheumatoid fact SerPl-aCnc: 37.3 IU/mL — ABNORMAL HIGH (ref 0.0–13.9)

## 2019-04-29 LAB — URIC ACID: Uric Acid: 8.7 mg/dL — ABNORMAL HIGH (ref 3.1–7.9)

## 2019-04-29 LAB — HEMOGLOBIN A1C
Est. average glucose Bld gHb Est-mCnc: 148 mg/dL
Hgb A1c MFr Bld: 6.8 % — ABNORMAL HIGH (ref 4.8–5.6)

## 2019-04-29 LAB — SEDIMENTATION RATE: Sed Rate: 32 mm/hr (ref 0–40)

## 2019-04-29 LAB — ANTINUCLEAR ANTIBODIES, IFA: ANA Titer 1: NEGATIVE

## 2019-04-29 LAB — CYCLIC CITRUL PEPTIDE ANTIBODY, IGG/IGA: Cyclic Citrullin Peptide Ab: 161 units — ABNORMAL HIGH (ref 0–19)

## 2019-05-16 NOTE — Progress Notes (Signed)
This visit occurred during the SARS-CoV-2 public health emergency.  Safety protocols were in place, including screening questions prior to the visit, additional usage of staff PPE, and extensive cleaning of exam room while observing appropriate contact time as indicated for disinfecting solutions.  Subjective:     Patient ID: [place] , female    DOB: 10-16-1927 , 86 y.o.   MRN: [id number]   Chief Complaint  Patient presents with  . Diabetes  . Hypertension    HPI  Diabetes She presents for her follow-up diabetic visit. She has type 2 diabetes mellitus. Her disease course has been stable. Pertinent negatives for hypoglycemia include no dizziness. Pertinent negatives for diabetes include no blurred vision, no chest pain, no fatigue, no polydipsia, no polyphagia and no polyuria. Symptoms are stable. Current diabetic treatment includes oral agent (monotherapy). She is following a generally healthy diet. When asked about meal planning, she reported none. She has not had a previous visit with a dietitian. She participates in exercise intermittently. Her overall blood glucose range is 90-110 mg/dl. An ACE inhibitor/angiotensin II receptor blocker is being taken.  Hypertension This is a chronic problem. The current episode started more than 1 year ago. The problem has been gradually improving since onset. The problem is controlled. Pertinent negatives include no blurred vision, chest pain, palpitations or shortness of breath. Risk factors for coronary artery disease include post-menopausal state.     Past Medical History:  Diagnosis Date  . Cerebrovascular disease, unspecified   . Coronary atherosclerosis of unspecified type of vessel, native or graft   . Diabetes mellitus without complication ([place])   . Other and unspecified hyperlipidemia   . Rheumatoid arthritis(714.0)   . Shingles 2003  . Unspecified essential hypertension      Family History  Problem Relation Age of Onset  .  Cancer Mother   . Hypertension Father   . Cancer Brother   . Hypertension Paternal Grandmother   . Heart disease Brother   . Diabetes Neg Hx   . Coronary artery disease Neg Hx      Current Outpatient Medications:  .  acetaminophen (TYLENOL) 650 MG CR tablet, Take 650 mg by mouth every 8 (eight) hours as needed for pain., Disp: , Rfl:  .  amLODipine (NORVASC) 5 MG tablet, Take 1 tablet (5 mg total) by mouth daily., Disp: 90 tablet, Rfl: 2 .  carvedilol (COREG) 12.5 MG tablet, TAKE 1 TABLET(12.5 MG) BY MOUTH TWICE DAILY WITH A MEAL, Disp: 180 tablet, Rfl: 1 .  Cholecalciferol (VITAMIN D) 125 MCG (5000 UT) CAPS, Take by mouth., Disp: , Rfl:  .  dipyridamole-aspirin (AGGRENOX) 200-25 MG 12hr capsule, TAKE ONE CAPLET CAPSULE BY MOUTH EVERY DAY, Disp: 60 capsule, Rfl: 0 .  glimepiride (AMARYL) 1 MG tablet, Take 1/2 tablet by mouth Monday, Wednesday, Friday, Disp: 36 tablet, Rfl: 1 .  glucosamine-chondroitin 500-400 MG tablet, Take 1 tablet by mouth 3 (three) times daily.  , Disp: , Rfl:  .  irbesartan-hydrochlorothiazide (AVALIDE) 150-12.5 MG tablet, Take 1 tablet by mouth daily., Disp: 90 tablet, Rfl: 2 .  Multiple Vitamin (MULTIVITAMIN) capsule, Take 1 capsule by mouth daily.  , Disp: , Rfl:  .  nitroGLYCERIN (NITROSTAT) 0.4 MG SL tablet, PLACE 1 TABLET UNDER THE TONGUE AT THE 1ST SIGN OF ATTACK, MAY REPEAT EVERY 5 MINUTES FOR 3 DOSES IN 15 MINUTES, IF PAIN PERSISTS, CALL 911, Disp: 50 tablet, Rfl: 0 .  rosuvastatin (CRESTOR) 5 MG tablet, TAKE 1 TABLET(5 MG) BY MOUTH DAILY,  Disp: 90 tablet, Rfl: 1   No Known Allergies   Review of Systems  Constitutional: Negative.  Negative for fatigue.  Eyes: Negative for blurred vision.  Respiratory: Negative.  Negative for shortness of breath.   Cardiovascular: Negative.  Negative for chest pain and palpitations.  Gastrointestinal: Negative.   Endocrine: Negative for polydipsia, polyphagia and polyuria.  Musculoskeletal: Positive for arthralgias and  back pain.       She c/o low back pain. Denies fall/trauma. Described as dull, achy pain. Denies LE weakness/paresthesias. Unsure what triggered her initial sx.   Neurological: Negative.  Negative for dizziness.  Psychiatric/Behavioral: Negative.      Today's Vitals   04/28/19 1007  BP: 128/66  Pulse: 77  Temp: 98.6 F (37 C)  TempSrc: Oral  SpO2: 92%  Weight: 140 lb 12.8 oz (63.9 kg)  Height: '5\' 1"'$  (1.549 m)   Body mass index is 26.6 kg/m.   Objective:  Physical Exam Vitals and nursing note reviewed.  Constitutional:      Appearance: Normal appearance.  HENT:     Head: Normocephalic and atraumatic.  Cardiovascular:     Rate and Rhythm: Normal rate and regular rhythm.     Heart sounds: Normal heart sounds.  Pulmonary:     Effort: Pulmonary effort is normal.     Breath sounds: Normal breath sounds.  Musculoskeletal:        General: Tenderness present.     Comments: Paraspinal muscle tenderness to palpitation. No pinpoint/bony tenderness  Skin:    General: Skin is warm.  Neurological:     General: No focal deficit present.     Mental Status: She is alert.  Psychiatric:        Mood and Affect: Mood normal.        Behavior: Behavior normal.         Assessment And Plan:     1. Type 2 diabetes mellitus with stage 3 chronic kidney disease, without long-term current use of insulin, unspecified whether stage 3a or 3b CKD (HCC)  Chronic, I will check labs as listed below. Importance of dietary compliance was discussed with the patient. I would like to completely wean her off of sulfonylurea, but she does not wish to change her meds.   - CMP14+EGFR - Hemoglobin A1c - Lipid panel  2. Hypertensive heart and renal disease with renal failure, stage 1 through stage 4 or unspecified chronic kidney disease, without heart failure  Chronic, well controlled. She will continue with current meds. She is encouraged to avoid adding salt to her foods.   3. Atherosclerosis of  native coronary artery of native heart without angina pectoris  Chronic, yet stable. She is encouraged to continue with statin therapy.   4. Rheumatoid arthritis involving multiple sites, unspecified whether rheumatoid factor present (HCC)  Chronic. Unfortunately, she is no longer under the care of a rheumatologist. I will refer her to Rheum if needed.   - ANA, IFA (with reflex) - CYCLIC CITRUL PEPTIDE ANTIBODY, IGG/IGA - Rheumatoid factor - Sedimentation rate - Uric acid  5. Chronic bilateral low back pain without sciatica  Chronic. She agrees to [facility] evaluation. Pt advised that she will benefit from physical therapy. She is encouraged to perform exercises taught to her, even on her "off" days.   - Ambulatory referral to [place]  6. Right hip pain  Possibly due to bursitis vs. OA. I will refer hre to Ortho when  [name], MD    THE PATIENT  IS ENCOURAGED TO PRACTICE SOCIAL DISTANCING DUE TO THE COVID-19 PANDEMIC.

## 2019-05-17 ENCOUNTER — Other Ambulatory Visit: Payer: Self-pay

## 2019-05-17 ENCOUNTER — Telehealth: Payer: Self-pay

## 2019-05-17 DIAGNOSIS — E119 Type 2 diabetes mellitus without complications: Secondary | ICD-10-CM

## 2019-05-17 MED ORDER — ACCU-CHEK AVIVA PLUS W/DEVICE KIT: PACK | 3 refills | Status: DC

## 2019-05-17 MED ORDER — GLUCOSE BLOOD VI STRP: ORAL_STRIP | 12 refills | Status: DC

## 2019-05-17 MED ORDER — ACCU-CHEK SOFTCLIX LANCETS MISC: 12 refills | Status: DC

## 2019-05-17 MED ORDER — ACCU-CHEK SOFTCLIX LANCETS MISC: 12 refills | Status: AC

## 2019-05-17 MED ORDER — GLUCOSE BLOOD VI STRP: ORAL_STRIP | 12 refills | Status: AC

## 2019-05-17 NOTE — Telephone Encounter (Signed)
Reached out to patient to advise her since she is not currently on insulin she will have to cover the price of test strips and supplies for a meter.

## 2019-05-24 DIAGNOSIS — M160 Bilateral primary osteoarthritis of hip: Secondary | ICD-10-CM | POA: Diagnosis not present

## 2019-05-24 DIAGNOSIS — M545 Low back pain: Secondary | ICD-10-CM | POA: Diagnosis not present

## 2019-05-24 DIAGNOSIS — I69354 Hemiplegia and hemiparesis following cerebral infarction affecting left non-dominant side: Secondary | ICD-10-CM | POA: Diagnosis not present

## 2019-05-24 DIAGNOSIS — N183 Chronic kidney disease, stage 3 unspecified: Secondary | ICD-10-CM | POA: Diagnosis not present

## 2019-05-24 DIAGNOSIS — G8929 Other chronic pain: Secondary | ICD-10-CM | POA: Diagnosis not present

## 2019-05-24 DIAGNOSIS — I129 Hypertensive chronic kidney disease with stage 1 through stage 4 chronic kidney disease, or unspecified chronic kidney disease: Secondary | ICD-10-CM | POA: Diagnosis not present

## 2019-05-24 DIAGNOSIS — Z951 Presence of aortocoronary bypass graft: Secondary | ICD-10-CM

## 2019-05-24 DIAGNOSIS — I2510 Atherosclerotic heart disease of native coronary artery without angina pectoris: Secondary | ICD-10-CM | POA: Diagnosis not present

## 2019-05-24 DIAGNOSIS — M0609 Rheumatoid arthritis without rheumatoid factor, multiple sites: Secondary | ICD-10-CM | POA: Diagnosis not present

## 2019-05-24 DIAGNOSIS — E785 Hyperlipidemia, unspecified: Secondary | ICD-10-CM

## 2019-05-24 DIAGNOSIS — Z7984 Long term (current) use of oral hypoglycemic drugs: Secondary | ICD-10-CM

## 2019-05-24 DIAGNOSIS — E1122 Type 2 diabetes mellitus with diabetic chronic kidney disease: Secondary | ICD-10-CM | POA: Diagnosis not present

## 2019-05-27 DIAGNOSIS — I69354 Hemiplegia and hemiparesis following cerebral infarction affecting left non-dominant side: Secondary | ICD-10-CM | POA: Diagnosis not present

## 2019-05-27 DIAGNOSIS — M0609 Rheumatoid arthritis without rheumatoid factor, multiple sites: Secondary | ICD-10-CM | POA: Diagnosis not present

## 2019-05-27 DIAGNOSIS — I129 Hypertensive chronic kidney disease with stage 1 through stage 4 chronic kidney disease, or unspecified chronic kidney disease: Secondary | ICD-10-CM | POA: Diagnosis not present

## 2019-05-27 DIAGNOSIS — G8929 Other chronic pain: Secondary | ICD-10-CM | POA: Diagnosis not present

## 2019-05-27 DIAGNOSIS — M160 Bilateral primary osteoarthritis of hip: Secondary | ICD-10-CM | POA: Diagnosis not present

## 2019-05-27 DIAGNOSIS — M545 Low back pain: Secondary | ICD-10-CM | POA: Diagnosis not present

## 2019-05-27 DIAGNOSIS — I2510 Atherosclerotic heart disease of native coronary artery without angina pectoris: Secondary | ICD-10-CM | POA: Diagnosis not present

## 2019-05-27 DIAGNOSIS — E1122 Type 2 diabetes mellitus with diabetic chronic kidney disease: Secondary | ICD-10-CM | POA: Diagnosis not present

## 2019-05-27 DIAGNOSIS — N183 Chronic kidney disease, stage 3 unspecified: Secondary | ICD-10-CM | POA: Diagnosis not present

## 2019-05-31 DIAGNOSIS — I2510 Atherosclerotic heart disease of native coronary artery without angina pectoris: Secondary | ICD-10-CM | POA: Diagnosis not present

## 2019-05-31 DIAGNOSIS — G8929 Other chronic pain: Secondary | ICD-10-CM | POA: Diagnosis not present

## 2019-05-31 DIAGNOSIS — M0609 Rheumatoid arthritis without rheumatoid factor, multiple sites: Secondary | ICD-10-CM | POA: Diagnosis not present

## 2019-05-31 DIAGNOSIS — N183 Chronic kidney disease, stage 3 unspecified: Secondary | ICD-10-CM | POA: Diagnosis not present

## 2019-05-31 DIAGNOSIS — E1122 Type 2 diabetes mellitus with diabetic chronic kidney disease: Secondary | ICD-10-CM | POA: Diagnosis not present

## 2019-05-31 DIAGNOSIS — M545 Low back pain: Secondary | ICD-10-CM | POA: Diagnosis not present

## 2019-05-31 DIAGNOSIS — M160 Bilateral primary osteoarthritis of hip: Secondary | ICD-10-CM | POA: Diagnosis not present

## 2019-05-31 DIAGNOSIS — I69354 Hemiplegia and hemiparesis following cerebral infarction affecting left non-dominant side: Secondary | ICD-10-CM | POA: Diagnosis not present

## 2019-05-31 DIAGNOSIS — I129 Hypertensive chronic kidney disease with stage 1 through stage 4 chronic kidney disease, or unspecified chronic kidney disease: Secondary | ICD-10-CM | POA: Diagnosis not present

## 2019-06-02 ENCOUNTER — Other Ambulatory Visit: Payer: Self-pay

## 2019-06-02 DIAGNOSIS — M160 Bilateral primary osteoarthritis of hip: Secondary | ICD-10-CM | POA: Diagnosis not present

## 2019-06-02 DIAGNOSIS — M545 Low back pain: Secondary | ICD-10-CM | POA: Diagnosis not present

## 2019-06-02 DIAGNOSIS — I2510 Atherosclerotic heart disease of native coronary artery without angina pectoris: Secondary | ICD-10-CM | POA: Diagnosis not present

## 2019-06-02 DIAGNOSIS — E1122 Type 2 diabetes mellitus with diabetic chronic kidney disease: Secondary | ICD-10-CM | POA: Diagnosis not present

## 2019-06-02 DIAGNOSIS — I69354 Hemiplegia and hemiparesis following cerebral infarction affecting left non-dominant side: Secondary | ICD-10-CM | POA: Diagnosis not present

## 2019-06-02 DIAGNOSIS — M0609 Rheumatoid arthritis without rheumatoid factor, multiple sites: Secondary | ICD-10-CM | POA: Diagnosis not present

## 2019-06-02 DIAGNOSIS — G8929 Other chronic pain: Secondary | ICD-10-CM | POA: Diagnosis not present

## 2019-06-02 DIAGNOSIS — N183 Chronic kidney disease, stage 3 unspecified: Secondary | ICD-10-CM | POA: Diagnosis not present

## 2019-06-02 DIAGNOSIS — I129 Hypertensive chronic kidney disease with stage 1 through stage 4 chronic kidney disease, or unspecified chronic kidney disease: Secondary | ICD-10-CM | POA: Diagnosis not present

## 2019-06-02 MED ORDER — ACCU-CHEK AVIVA VI SOLN: 3 refills | Status: AC

## 2019-06-07 DIAGNOSIS — M160 Bilateral primary osteoarthritis of hip: Secondary | ICD-10-CM | POA: Diagnosis not present

## 2019-06-07 DIAGNOSIS — I2510 Atherosclerotic heart disease of native coronary artery without angina pectoris: Secondary | ICD-10-CM | POA: Diagnosis not present

## 2019-06-07 DIAGNOSIS — E1122 Type 2 diabetes mellitus with diabetic chronic kidney disease: Secondary | ICD-10-CM | POA: Diagnosis not present

## 2019-06-07 DIAGNOSIS — I69354 Hemiplegia and hemiparesis following cerebral infarction affecting left non-dominant side: Secondary | ICD-10-CM | POA: Diagnosis not present

## 2019-06-07 DIAGNOSIS — N183 Chronic kidney disease, stage 3 unspecified: Secondary | ICD-10-CM | POA: Diagnosis not present

## 2019-06-07 DIAGNOSIS — M0609 Rheumatoid arthritis without rheumatoid factor, multiple sites: Secondary | ICD-10-CM | POA: Diagnosis not present

## 2019-06-07 DIAGNOSIS — M545 Low back pain: Secondary | ICD-10-CM | POA: Diagnosis not present

## 2019-06-07 DIAGNOSIS — G8929 Other chronic pain: Secondary | ICD-10-CM | POA: Diagnosis not present

## 2019-06-07 DIAGNOSIS — I129 Hypertensive chronic kidney disease with stage 1 through stage 4 chronic kidney disease, or unspecified chronic kidney disease: Secondary | ICD-10-CM | POA: Diagnosis not present

## 2019-06-09 DIAGNOSIS — G8929 Other chronic pain: Secondary | ICD-10-CM | POA: Diagnosis not present

## 2019-06-09 DIAGNOSIS — N183 Chronic kidney disease, stage 3 unspecified: Secondary | ICD-10-CM | POA: Diagnosis not present

## 2019-06-09 DIAGNOSIS — I69354 Hemiplegia and hemiparesis following cerebral infarction affecting left non-dominant side: Secondary | ICD-10-CM | POA: Diagnosis not present

## 2019-06-09 DIAGNOSIS — M0609 Rheumatoid arthritis without rheumatoid factor, multiple sites: Secondary | ICD-10-CM | POA: Diagnosis not present

## 2019-06-09 DIAGNOSIS — M160 Bilateral primary osteoarthritis of hip: Secondary | ICD-10-CM | POA: Diagnosis not present

## 2019-06-09 DIAGNOSIS — M545 Low back pain: Secondary | ICD-10-CM | POA: Diagnosis not present

## 2019-06-09 DIAGNOSIS — I129 Hypertensive chronic kidney disease with stage 1 through stage 4 chronic kidney disease, or unspecified chronic kidney disease: Secondary | ICD-10-CM | POA: Diagnosis not present

## 2019-06-09 DIAGNOSIS — E1122 Type 2 diabetes mellitus with diabetic chronic kidney disease: Secondary | ICD-10-CM | POA: Diagnosis not present

## 2019-06-09 DIAGNOSIS — I2510 Atherosclerotic heart disease of native coronary artery without angina pectoris: Secondary | ICD-10-CM | POA: Diagnosis not present

## 2019-06-14 DIAGNOSIS — I2510 Atherosclerotic heart disease of native coronary artery without angina pectoris: Secondary | ICD-10-CM | POA: Diagnosis not present

## 2019-06-14 DIAGNOSIS — I129 Hypertensive chronic kidney disease with stage 1 through stage 4 chronic kidney disease, or unspecified chronic kidney disease: Secondary | ICD-10-CM | POA: Diagnosis not present

## 2019-06-14 DIAGNOSIS — E1122 Type 2 diabetes mellitus with diabetic chronic kidney disease: Secondary | ICD-10-CM | POA: Diagnosis not present

## 2019-06-14 DIAGNOSIS — N183 Chronic kidney disease, stage 3 unspecified: Secondary | ICD-10-CM | POA: Diagnosis not present

## 2019-06-14 DIAGNOSIS — I69354 Hemiplegia and hemiparesis following cerebral infarction affecting left non-dominant side: Secondary | ICD-10-CM | POA: Diagnosis not present

## 2019-06-14 DIAGNOSIS — G8929 Other chronic pain: Secondary | ICD-10-CM | POA: Diagnosis not present

## 2019-06-14 DIAGNOSIS — M545 Low back pain: Secondary | ICD-10-CM | POA: Diagnosis not present

## 2019-06-14 DIAGNOSIS — M160 Bilateral primary osteoarthritis of hip: Secondary | ICD-10-CM | POA: Diagnosis not present

## 2019-06-14 DIAGNOSIS — M0609 Rheumatoid arthritis without rheumatoid factor, multiple sites: Secondary | ICD-10-CM | POA: Diagnosis not present

## 2019-06-16 DIAGNOSIS — I2510 Atherosclerotic heart disease of native coronary artery without angina pectoris: Secondary | ICD-10-CM | POA: Diagnosis not present

## 2019-06-16 DIAGNOSIS — N183 Chronic kidney disease, stage 3 unspecified: Secondary | ICD-10-CM | POA: Diagnosis not present

## 2019-06-16 DIAGNOSIS — M160 Bilateral primary osteoarthritis of hip: Secondary | ICD-10-CM | POA: Diagnosis not present

## 2019-06-16 DIAGNOSIS — M545 Low back pain: Secondary | ICD-10-CM | POA: Diagnosis not present

## 2019-06-16 DIAGNOSIS — G8929 Other chronic pain: Secondary | ICD-10-CM | POA: Diagnosis not present

## 2019-06-16 DIAGNOSIS — I69354 Hemiplegia and hemiparesis following cerebral infarction affecting left non-dominant side: Secondary | ICD-10-CM | POA: Diagnosis not present

## 2019-06-16 DIAGNOSIS — E1122 Type 2 diabetes mellitus with diabetic chronic kidney disease: Secondary | ICD-10-CM | POA: Diagnosis not present

## 2019-06-16 DIAGNOSIS — M0609 Rheumatoid arthritis without rheumatoid factor, multiple sites: Secondary | ICD-10-CM | POA: Diagnosis not present

## 2019-06-16 DIAGNOSIS — I129 Hypertensive chronic kidney disease with stage 1 through stage 4 chronic kidney disease, or unspecified chronic kidney disease: Secondary | ICD-10-CM | POA: Diagnosis not present

## 2019-06-21 DIAGNOSIS — E1165 Type 2 diabetes mellitus with hyperglycemia: Secondary | ICD-10-CM | POA: Diagnosis not present

## 2019-06-23 DIAGNOSIS — G8929 Other chronic pain: Secondary | ICD-10-CM | POA: Diagnosis not present

## 2019-06-23 DIAGNOSIS — N183 Chronic kidney disease, stage 3 unspecified: Secondary | ICD-10-CM | POA: Diagnosis not present

## 2019-06-23 DIAGNOSIS — E1122 Type 2 diabetes mellitus with diabetic chronic kidney disease: Secondary | ICD-10-CM | POA: Diagnosis not present

## 2019-06-23 DIAGNOSIS — I69354 Hemiplegia and hemiparesis following cerebral infarction affecting left non-dominant side: Secondary | ICD-10-CM | POA: Diagnosis not present

## 2019-06-23 DIAGNOSIS — I129 Hypertensive chronic kidney disease with stage 1 through stage 4 chronic kidney disease, or unspecified chronic kidney disease: Secondary | ICD-10-CM | POA: Diagnosis not present

## 2019-06-23 DIAGNOSIS — M0609 Rheumatoid arthritis without rheumatoid factor, multiple sites: Secondary | ICD-10-CM | POA: Diagnosis not present

## 2019-06-23 DIAGNOSIS — M545 Low back pain: Secondary | ICD-10-CM | POA: Diagnosis not present

## 2019-06-23 DIAGNOSIS — M160 Bilateral primary osteoarthritis of hip: Secondary | ICD-10-CM | POA: Diagnosis not present

## 2019-06-23 DIAGNOSIS — I2510 Atherosclerotic heart disease of native coronary artery without angina pectoris: Secondary | ICD-10-CM | POA: Diagnosis not present

## 2019-06-28 DIAGNOSIS — N183 Chronic kidney disease, stage 3 unspecified: Secondary | ICD-10-CM | POA: Diagnosis not present

## 2019-06-28 DIAGNOSIS — I129 Hypertensive chronic kidney disease with stage 1 through stage 4 chronic kidney disease, or unspecified chronic kidney disease: Secondary | ICD-10-CM | POA: Diagnosis not present

## 2019-06-28 DIAGNOSIS — M0609 Rheumatoid arthritis without rheumatoid factor, multiple sites: Secondary | ICD-10-CM | POA: Diagnosis not present

## 2019-06-28 DIAGNOSIS — M160 Bilateral primary osteoarthritis of hip: Secondary | ICD-10-CM | POA: Diagnosis not present

## 2019-06-28 DIAGNOSIS — M545 Low back pain: Secondary | ICD-10-CM | POA: Diagnosis not present

## 2019-06-28 DIAGNOSIS — I69354 Hemiplegia and hemiparesis following cerebral infarction affecting left non-dominant side: Secondary | ICD-10-CM | POA: Diagnosis not present

## 2019-06-28 DIAGNOSIS — G8929 Other chronic pain: Secondary | ICD-10-CM | POA: Diagnosis not present

## 2019-06-28 DIAGNOSIS — I2510 Atherosclerotic heart disease of native coronary artery without angina pectoris: Secondary | ICD-10-CM | POA: Diagnosis not present

## 2019-06-28 DIAGNOSIS — E1122 Type 2 diabetes mellitus with diabetic chronic kidney disease: Secondary | ICD-10-CM | POA: Diagnosis not present

## 2019-07-05 ENCOUNTER — Other Ambulatory Visit: Payer: Self-pay

## 2019-07-05 ENCOUNTER — Ambulatory Visit: Payer: Medicare PPO

## 2019-07-05 VITALS — BP 138/74 | HR 68 | Temp 98.4°F | Ht 60.8 in | Wt 134.4 lb

## 2019-07-05 DIAGNOSIS — G8929 Other chronic pain: Secondary | ICD-10-CM | POA: Diagnosis not present

## 2019-07-05 DIAGNOSIS — E1122 Type 2 diabetes mellitus with diabetic chronic kidney disease: Secondary | ICD-10-CM | POA: Diagnosis not present

## 2019-07-05 DIAGNOSIS — N183 Chronic kidney disease, stage 3 unspecified: Secondary | ICD-10-CM | POA: Diagnosis not present

## 2019-07-05 DIAGNOSIS — M545 Low back pain: Secondary | ICD-10-CM | POA: Diagnosis not present

## 2019-07-05 DIAGNOSIS — I129 Hypertensive chronic kidney disease with stage 1 through stage 4 chronic kidney disease, or unspecified chronic kidney disease: Secondary | ICD-10-CM | POA: Diagnosis not present

## 2019-07-05 DIAGNOSIS — I69354 Hemiplegia and hemiparesis following cerebral infarction affecting left non-dominant side: Secondary | ICD-10-CM | POA: Diagnosis not present

## 2019-07-05 DIAGNOSIS — M160 Bilateral primary osteoarthritis of hip: Secondary | ICD-10-CM | POA: Diagnosis not present

## 2019-07-05 DIAGNOSIS — I2510 Atherosclerotic heart disease of native coronary artery without angina pectoris: Secondary | ICD-10-CM | POA: Diagnosis not present

## 2019-07-05 DIAGNOSIS — E119 Type 2 diabetes mellitus without complications: Secondary | ICD-10-CM

## 2019-07-05 DIAGNOSIS — M0609 Rheumatoid arthritis without rheumatoid factor, multiple sites: Secondary | ICD-10-CM | POA: Diagnosis not present

## 2019-07-05 NOTE — Progress Notes (Signed)
Patient is here today to be taught how to use freestyle  libre meter. Application of the sensor was explained an demonstrated. The reader was set up and explained to the patient. Informed to change every 14 days and also shown how to take sensor off. Pt stated that she is understanding and that she is excited that she does not have to stick herself anymore. Pt encouraged to call the office if she has any trouble with meter.

## 2019-07-12 DIAGNOSIS — G8929 Other chronic pain: Secondary | ICD-10-CM | POA: Diagnosis not present

## 2019-07-12 DIAGNOSIS — N183 Chronic kidney disease, stage 3 unspecified: Secondary | ICD-10-CM | POA: Diagnosis not present

## 2019-07-12 DIAGNOSIS — I69354 Hemiplegia and hemiparesis following cerebral infarction affecting left non-dominant side: Secondary | ICD-10-CM | POA: Diagnosis not present

## 2019-07-12 DIAGNOSIS — M160 Bilateral primary osteoarthritis of hip: Secondary | ICD-10-CM | POA: Diagnosis not present

## 2019-07-12 DIAGNOSIS — E1122 Type 2 diabetes mellitus with diabetic chronic kidney disease: Secondary | ICD-10-CM | POA: Diagnosis not present

## 2019-07-12 DIAGNOSIS — I2510 Atherosclerotic heart disease of native coronary artery without angina pectoris: Secondary | ICD-10-CM | POA: Diagnosis not present

## 2019-07-12 DIAGNOSIS — I129 Hypertensive chronic kidney disease with stage 1 through stage 4 chronic kidney disease, or unspecified chronic kidney disease: Secondary | ICD-10-CM | POA: Diagnosis not present

## 2019-07-12 DIAGNOSIS — M545 Low back pain: Secondary | ICD-10-CM | POA: Diagnosis not present

## 2019-07-12 DIAGNOSIS — M0609 Rheumatoid arthritis without rheumatoid factor, multiple sites: Secondary | ICD-10-CM | POA: Diagnosis not present

## 2019-07-13 ENCOUNTER — Telehealth: Payer: Self-pay

## 2019-07-19 DIAGNOSIS — M069 Rheumatoid arthritis, unspecified: Secondary | ICD-10-CM | POA: Diagnosis not present

## 2019-07-20 DIAGNOSIS — M160 Bilateral primary osteoarthritis of hip: Secondary | ICD-10-CM | POA: Diagnosis not present

## 2019-07-20 DIAGNOSIS — I129 Hypertensive chronic kidney disease with stage 1 through stage 4 chronic kidney disease, or unspecified chronic kidney disease: Secondary | ICD-10-CM | POA: Diagnosis not present

## 2019-07-20 DIAGNOSIS — M0609 Rheumatoid arthritis without rheumatoid factor, multiple sites: Secondary | ICD-10-CM | POA: Diagnosis not present

## 2019-07-20 DIAGNOSIS — G8929 Other chronic pain: Secondary | ICD-10-CM | POA: Diagnosis not present

## 2019-07-20 DIAGNOSIS — M545 Low back pain: Secondary | ICD-10-CM | POA: Diagnosis not present

## 2019-07-20 DIAGNOSIS — I2510 Atherosclerotic heart disease of native coronary artery without angina pectoris: Secondary | ICD-10-CM | POA: Diagnosis not present

## 2019-07-20 DIAGNOSIS — N183 Chronic kidney disease, stage 3 unspecified: Secondary | ICD-10-CM | POA: Diagnosis not present

## 2019-07-20 DIAGNOSIS — E1122 Type 2 diabetes mellitus with diabetic chronic kidney disease: Secondary | ICD-10-CM | POA: Diagnosis not present

## 2019-07-20 DIAGNOSIS — I69354 Hemiplegia and hemiparesis following cerebral infarction affecting left non-dominant side: Secondary | ICD-10-CM | POA: Diagnosis not present

## 2019-07-23 DIAGNOSIS — I129 Hypertensive chronic kidney disease with stage 1 through stage 4 chronic kidney disease, or unspecified chronic kidney disease: Secondary | ICD-10-CM | POA: Diagnosis not present

## 2019-07-23 DIAGNOSIS — N183 Chronic kidney disease, stage 3 unspecified: Secondary | ICD-10-CM | POA: Diagnosis not present

## 2019-07-23 DIAGNOSIS — M160 Bilateral primary osteoarthritis of hip: Secondary | ICD-10-CM | POA: Diagnosis not present

## 2019-07-23 DIAGNOSIS — M0609 Rheumatoid arthritis without rheumatoid factor, multiple sites: Secondary | ICD-10-CM | POA: Diagnosis not present

## 2019-07-23 DIAGNOSIS — M545 Low back pain: Secondary | ICD-10-CM | POA: Diagnosis not present

## 2019-07-23 DIAGNOSIS — I2510 Atherosclerotic heart disease of native coronary artery without angina pectoris: Secondary | ICD-10-CM | POA: Diagnosis not present

## 2019-07-23 DIAGNOSIS — G8929 Other chronic pain: Secondary | ICD-10-CM | POA: Diagnosis not present

## 2019-07-23 DIAGNOSIS — I69354 Hemiplegia and hemiparesis following cerebral infarction affecting left non-dominant side: Secondary | ICD-10-CM | POA: Diagnosis not present

## 2019-07-23 DIAGNOSIS — E1122 Type 2 diabetes mellitus with diabetic chronic kidney disease: Secondary | ICD-10-CM | POA: Diagnosis not present

## 2019-07-25 DIAGNOSIS — I129 Hypertensive chronic kidney disease with stage 1 through stage 4 chronic kidney disease, or unspecified chronic kidney disease: Secondary | ICD-10-CM | POA: Diagnosis not present

## 2019-07-25 DIAGNOSIS — M545 Low back pain: Secondary | ICD-10-CM | POA: Diagnosis not present

## 2019-07-25 DIAGNOSIS — M160 Bilateral primary osteoarthritis of hip: Secondary | ICD-10-CM | POA: Diagnosis not present

## 2019-07-25 DIAGNOSIS — I69354 Hemiplegia and hemiparesis following cerebral infarction affecting left non-dominant side: Secondary | ICD-10-CM | POA: Diagnosis not present

## 2019-07-25 DIAGNOSIS — G8929 Other chronic pain: Secondary | ICD-10-CM | POA: Diagnosis not present

## 2019-07-25 DIAGNOSIS — E1122 Type 2 diabetes mellitus with diabetic chronic kidney disease: Secondary | ICD-10-CM | POA: Diagnosis not present

## 2019-07-25 DIAGNOSIS — N183 Chronic kidney disease, stage 3 unspecified: Secondary | ICD-10-CM | POA: Diagnosis not present

## 2019-07-26 DIAGNOSIS — M160 Bilateral primary osteoarthritis of hip: Secondary | ICD-10-CM | POA: Diagnosis not present

## 2019-07-26 DIAGNOSIS — I2510 Atherosclerotic heart disease of native coronary artery without angina pectoris: Secondary | ICD-10-CM | POA: Diagnosis not present

## 2019-07-26 DIAGNOSIS — M0609 Rheumatoid arthritis without rheumatoid factor, multiple sites: Secondary | ICD-10-CM | POA: Diagnosis not present

## 2019-07-26 DIAGNOSIS — G8929 Other chronic pain: Secondary | ICD-10-CM | POA: Diagnosis not present

## 2019-07-26 DIAGNOSIS — I129 Hypertensive chronic kidney disease with stage 1 through stage 4 chronic kidney disease, or unspecified chronic kidney disease: Secondary | ICD-10-CM | POA: Diagnosis not present

## 2019-07-26 DIAGNOSIS — E1122 Type 2 diabetes mellitus with diabetic chronic kidney disease: Secondary | ICD-10-CM | POA: Diagnosis not present

## 2019-07-26 DIAGNOSIS — M545 Low back pain: Secondary | ICD-10-CM | POA: Diagnosis not present

## 2019-07-26 DIAGNOSIS — I69354 Hemiplegia and hemiparesis following cerebral infarction affecting left non-dominant side: Secondary | ICD-10-CM | POA: Diagnosis not present

## 2019-07-26 DIAGNOSIS — N183 Chronic kidney disease, stage 3 unspecified: Secondary | ICD-10-CM | POA: Diagnosis not present

## 2019-07-28 DIAGNOSIS — E1122 Type 2 diabetes mellitus with diabetic chronic kidney disease: Secondary | ICD-10-CM | POA: Diagnosis not present

## 2019-07-28 DIAGNOSIS — M160 Bilateral primary osteoarthritis of hip: Secondary | ICD-10-CM | POA: Diagnosis not present

## 2019-07-28 DIAGNOSIS — G8929 Other chronic pain: Secondary | ICD-10-CM | POA: Diagnosis not present

## 2019-07-28 DIAGNOSIS — I129 Hypertensive chronic kidney disease with stage 1 through stage 4 chronic kidney disease, or unspecified chronic kidney disease: Secondary | ICD-10-CM | POA: Diagnosis not present

## 2019-07-28 DIAGNOSIS — M0609 Rheumatoid arthritis without rheumatoid factor, multiple sites: Secondary | ICD-10-CM | POA: Diagnosis not present

## 2019-07-28 DIAGNOSIS — I2510 Atherosclerotic heart disease of native coronary artery without angina pectoris: Secondary | ICD-10-CM | POA: Diagnosis not present

## 2019-07-28 DIAGNOSIS — N183 Chronic kidney disease, stage 3 unspecified: Secondary | ICD-10-CM | POA: Diagnosis not present

## 2019-07-28 DIAGNOSIS — M545 Low back pain: Secondary | ICD-10-CM | POA: Diagnosis not present

## 2019-07-28 DIAGNOSIS — I69354 Hemiplegia and hemiparesis following cerebral infarction affecting left non-dominant side: Secondary | ICD-10-CM | POA: Diagnosis not present

## 2019-08-02 DIAGNOSIS — E1122 Type 2 diabetes mellitus with diabetic chronic kidney disease: Secondary | ICD-10-CM | POA: Diagnosis not present

## 2019-08-02 DIAGNOSIS — I2510 Atherosclerotic heart disease of native coronary artery without angina pectoris: Secondary | ICD-10-CM | POA: Diagnosis not present

## 2019-08-02 DIAGNOSIS — M0609 Rheumatoid arthritis without rheumatoid factor, multiple sites: Secondary | ICD-10-CM | POA: Diagnosis not present

## 2019-08-02 DIAGNOSIS — I129 Hypertensive chronic kidney disease with stage 1 through stage 4 chronic kidney disease, or unspecified chronic kidney disease: Secondary | ICD-10-CM | POA: Diagnosis not present

## 2019-08-02 DIAGNOSIS — N183 Chronic kidney disease, stage 3 unspecified: Secondary | ICD-10-CM | POA: Diagnosis not present

## 2019-08-02 DIAGNOSIS — M160 Bilateral primary osteoarthritis of hip: Secondary | ICD-10-CM | POA: Diagnosis not present

## 2019-08-02 DIAGNOSIS — M545 Low back pain: Secondary | ICD-10-CM | POA: Diagnosis not present

## 2019-08-02 DIAGNOSIS — I69354 Hemiplegia and hemiparesis following cerebral infarction affecting left non-dominant side: Secondary | ICD-10-CM | POA: Diagnosis not present

## 2019-08-02 DIAGNOSIS — G8929 Other chronic pain: Secondary | ICD-10-CM | POA: Diagnosis not present

## 2019-08-03 ENCOUNTER — Other Ambulatory Visit: Payer: Self-pay

## 2019-08-03 ENCOUNTER — Telehealth: Payer: Self-pay

## 2019-08-03 ENCOUNTER — Ambulatory Visit: Payer: Medicare PPO | Admitting: Internal Medicine

## 2019-08-03 ENCOUNTER — Encounter: Payer: Self-pay | Admitting: Internal Medicine

## 2019-08-03 VITALS — BP 126/78 | HR 70 | Temp 98.8°F | Ht 59.8 in | Wt 139.0 lb

## 2019-08-03 DIAGNOSIS — I2510 Atherosclerotic heart disease of native coronary artery without angina pectoris: Secondary | ICD-10-CM

## 2019-08-03 DIAGNOSIS — N184 Chronic kidney disease, stage 4 (severe): Secondary | ICD-10-CM

## 2019-08-03 DIAGNOSIS — E1122 Type 2 diabetes mellitus with diabetic chronic kidney disease: Secondary | ICD-10-CM

## 2019-08-03 DIAGNOSIS — I1310 Hypertensive heart and chronic kidney disease without heart failure, with stage 1 through stage 4 chronic kidney disease, or unspecified chronic kidney disease: Secondary | ICD-10-CM | POA: Diagnosis not present

## 2019-08-03 NOTE — Patient Instructions (Signed)
Chronic Kidney Disease, Adult Chronic kidney disease (CKD) happens when the kidneys are damaged over a long period of time. The kidneys are two organs that help with:  Getting rid of waste and extra fluid from the blood.  Making hormones that maintain the amount of fluid in your tissues and blood vessels.  Making sure that the body has the right amount of fluids and chemicals. Most of the time, CKD does not go away, but it can usually be controlled. Steps must be taken to slow down the kidney damage or to stop it from getting worse. If this is not done, the kidneys may stop working. Follow these instructions at home: Medicines  Take over-the-counter and prescription medicines only as told by your doctor. You may need to change the amount of medicines you take.  Do not take any new medicines unless your doctor says it is okay. Many medicines can make your kidney damage worse.  Do not take any vitamin and supplements unless your doctor says it is okay. Many vitamins and supplements can make your kidney damage worse. General instructions  Follow a diet as told by your doctor. You may need to stay away from: ? Alcohol. ? Salty foods. ? Foods that are high in:  Potassium.  Calcium.  Protein.  Do not use any products that contain nicotine or tobacco, such as cigarettes and e-cigarettes. If you need help quitting, ask your doctor.  Keep track of your blood pressure at home. Tell your doctor about any changes.  If you have diabetes, keep track of your blood sugar as told by your doctor.  Try to stay at a healthy weight. If you need help, ask your doctor.  Exercise at least 30 minutes a day, 5 days a week.  Stay up-to-date with your shots (immunizations) as told by your doctor.  Keep all follow-up visits as told by your doctor. This is important. Contact a doctor if:  Your symptoms get worse.  You have new symptoms. Get help right away if:  You have symptoms of end-stage  kidney disease. These may include: ? Headaches. ? Numbness in your hands or feet. ? Easy bruising. ? Having hiccups often. ? Chest pain. ? Shortness of breath. ? Stopping of menstrual periods in women.  You have a fever.  You have very little pee (urine).  You have pain or bleeding when you pee. Summary  Chronic kidney disease (CKD) happens when the kidneys are damaged over a long period of time.  Most of the time, this condition does not go away, but it can usually be controlled. Steps must be taken to slow down the kidney damage or to stop it from getting worse.  Treatment may include a combination of medicines and lifestyle changes. This information is not intended to replace advice given to you by your health care provider. Make sure you discuss any questions you have with your health care provider. Document Revised: 01/16/2017 Document Reviewed: 03/10/2016 Elsevier Patient Education  2020 Elsevier Inc.  

## 2019-08-03 NOTE — Telephone Encounter (Signed)
PT CONSENT TO MEDICARE WELL VIRTUAL VISIT ON 11/09/2019-TIMA

## 2019-08-04 ENCOUNTER — Telehealth: Payer: Self-pay

## 2019-08-04 DIAGNOSIS — N183 Chronic kidney disease, stage 3 unspecified: Secondary | ICD-10-CM | POA: Diagnosis not present

## 2019-08-04 DIAGNOSIS — M0609 Rheumatoid arthritis without rheumatoid factor, multiple sites: Secondary | ICD-10-CM | POA: Diagnosis not present

## 2019-08-04 DIAGNOSIS — G8929 Other chronic pain: Secondary | ICD-10-CM | POA: Diagnosis not present

## 2019-08-04 DIAGNOSIS — M160 Bilateral primary osteoarthritis of hip: Secondary | ICD-10-CM | POA: Diagnosis not present

## 2019-08-04 DIAGNOSIS — E1122 Type 2 diabetes mellitus with diabetic chronic kidney disease: Secondary | ICD-10-CM | POA: Diagnosis not present

## 2019-08-04 DIAGNOSIS — I2510 Atherosclerotic heart disease of native coronary artery without angina pectoris: Secondary | ICD-10-CM | POA: Diagnosis not present

## 2019-08-04 DIAGNOSIS — I69354 Hemiplegia and hemiparesis following cerebral infarction affecting left non-dominant side: Secondary | ICD-10-CM | POA: Diagnosis not present

## 2019-08-04 DIAGNOSIS — M545 Low back pain: Secondary | ICD-10-CM | POA: Diagnosis not present

## 2019-08-04 DIAGNOSIS — I129 Hypertensive chronic kidney disease with stage 1 through stage 4 chronic kidney disease, or unspecified chronic kidney disease: Secondary | ICD-10-CM | POA: Diagnosis not present

## 2019-08-04 NOTE — Telephone Encounter (Signed)
I was calling the pt to see if she has been compliant with her rosuvastatin 5 mg because the office got an alert form [facility] that she may not be.

## 2019-08-05 LAB — BMP8+EGFR
BUN/Creatinine Ratio: 29 — ABNORMAL HIGH (ref 12–28)
BUN: 50 mg/dL — ABNORMAL HIGH (ref 8–27)
CO2: 24 mmol/L (ref 20–29)
Calcium: 9.6 mg/dL (ref 8.7–10.3)
Chloride: 104 mmol/L (ref 96–106)
Creatinine, Ser: 1.74 mg/dL — ABNORMAL HIGH (ref 0.57–1.00)
GFR calc Af Amer: 30 mL/min/{1.73_m2} — ABNORMAL LOW (ref >59)
GFR calc non Af Amer: 26 mL/min/{1.73_m2} — ABNORMAL LOW (ref >59)
Glucose: 96 mg/dL (ref 65–99)
Potassium: 5.3 mmol/L — ABNORMAL HIGH (ref 3.5–5.2)
Sodium: 142 mmol/L (ref 134–144)

## 2019-08-05 LAB — PHOSPHORUS: Phosphorus: 4.3 mg/dL (ref 3.0–4.3)

## 2019-08-05 LAB — PTH, INTACT AND CALCIUM: PTH: 37 pg/mL (ref 15–65)

## 2019-08-05 LAB — CBC
Hematocrit: 32.5 % — ABNORMAL LOW (ref 34.0–46.6)
Hemoglobin: 11.1 g/dL (ref 11.1–15.9)
MCH: 32.5 pg (ref 26.6–33.0)
MCHC: 34.2 g/dL (ref 31.5–35.7)
MCV: 95 fL (ref 79–97)
Platelets: 199 10*3/uL (ref 150–450)
RBC: 3.42 x10E6/uL — ABNORMAL LOW (ref 3.77–5.28)
RDW: 13.2 % (ref 11.7–15.4)
WBC: 5.2 10*3/uL (ref 3.4–10.8)

## 2019-08-05 LAB — PROTEIN ELECTROPHORESIS, SERUM
A/G Ratio: 1.1 (ref 0.7–1.7)
Albumin ELP: 3.7 g/dL (ref 2.9–4.4)
Alpha 1: 0.2 g/dL (ref 0.0–0.4)
Alpha 2: 1 g/dL (ref 0.4–1.0)
Beta: 1 g/dL (ref 0.7–1.3)
Gamma Globulin: 1.3 g/dL (ref 0.4–1.8)
Globulin, Total: 3.5 g/dL (ref 2.2–3.9)
M-Spike, %: 0.1 g/dL — ABNORMAL HIGH
Total Protein: 7.2 g/dL (ref 6.0–8.5)

## 2019-08-05 LAB — HEMOGLOBIN A1C
Est. average glucose Bld gHb Est-mCnc: 148 mg/dL
Hgb A1c MFr Bld: 6.8 % — ABNORMAL HIGH (ref 4.8–5.6)

## 2019-08-09 ENCOUNTER — Other Ambulatory Visit: Payer: Self-pay | Admitting: Internal Medicine

## 2019-08-09 DIAGNOSIS — I2510 Atherosclerotic heart disease of native coronary artery without angina pectoris: Secondary | ICD-10-CM

## 2019-08-11 DIAGNOSIS — E1122 Type 2 diabetes mellitus with diabetic chronic kidney disease: Secondary | ICD-10-CM | POA: Diagnosis not present

## 2019-08-11 DIAGNOSIS — M160 Bilateral primary osteoarthritis of hip: Secondary | ICD-10-CM | POA: Diagnosis not present

## 2019-08-11 DIAGNOSIS — M0609 Rheumatoid arthritis without rheumatoid factor, multiple sites: Secondary | ICD-10-CM | POA: Diagnosis not present

## 2019-08-11 DIAGNOSIS — G8929 Other chronic pain: Secondary | ICD-10-CM | POA: Diagnosis not present

## 2019-08-11 DIAGNOSIS — I2510 Atherosclerotic heart disease of native coronary artery without angina pectoris: Secondary | ICD-10-CM | POA: Diagnosis not present

## 2019-08-11 DIAGNOSIS — I129 Hypertensive chronic kidney disease with stage 1 through stage 4 chronic kidney disease, or unspecified chronic kidney disease: Secondary | ICD-10-CM | POA: Diagnosis not present

## 2019-08-11 DIAGNOSIS — I69354 Hemiplegia and hemiparesis following cerebral infarction affecting left non-dominant side: Secondary | ICD-10-CM | POA: Diagnosis not present

## 2019-08-11 DIAGNOSIS — M545 Low back pain: Secondary | ICD-10-CM | POA: Diagnosis not present

## 2019-08-11 DIAGNOSIS — N183 Chronic kidney disease, stage 3 unspecified: Secondary | ICD-10-CM | POA: Diagnosis not present

## 2019-08-12 ENCOUNTER — Ambulatory Visit: Payer: Self-pay

## 2019-08-12 DIAGNOSIS — I1310 Hypertensive heart and chronic kidney disease without heart failure, with stage 1 through stage 4 chronic kidney disease, or unspecified chronic kidney disease: Secondary | ICD-10-CM

## 2019-08-12 DIAGNOSIS — N184 Chronic kidney disease, stage 4 (severe): Secondary | ICD-10-CM

## 2019-08-12 NOTE — Chronic Care Management (AMB) (Signed)
  Chronic Care Management    Social Work Follow Up Note  08/12/2019 Name: [name] MRN: [id number] DOB: [birth year]  [name] is a 86 y.o. year old female who is a primary care patient of Glendale Chard, MD. The CCM team was consulted for assistance with care coordination.   Review of patient status, including review of consultants reports, other relevant assessments, and collaboration with appropriate care team members and the patient's provider was performed as part of comprehensive patient evaluation and provision of chronic care management services.    SDOH (Social Determinants of Health) assessments performed: Yes; no acute needs identified. SDOH Interventions     Most Recent Value  SDOH Interventions  Food Insecurity Interventions Intervention Not Indicated  Housing Interventions Intervention Not Indicated  Transportation Interventions Intervention Not Indicated       Outpatient Encounter Medications as of 08/12/2019  Medication Sig  . Accu-Chek Softclix Lancets lancets Use as instructed to check blood sugars up to 3 times a day  Dx code e11.65  . acetaminophen (TYLENOL) 650 MG CR tablet Take 650 mg by mouth every 8 (eight) hours as needed for pain.  [name] amLODipine (NORVASC) 5 MG tablet Take 1 tablet (5 mg total) by mouth daily.  . Blood Glucose Calibration (ACCU-CHEK AVIVA) SOLN Use with accu-check aviva machine  . Blood Glucose Monitoring Suppl (ACCU-CHEK AVIVA PLUS) w/Device KIT Use to check  Blood sugars up to 3 times a day.  Dx code e11.65  . carvedilol (COREG) 12.5 MG tablet TAKE 1 TABLET(12.5 MG) BY MOUTH TWICE DAILY WITH A MEAL  . Cholecalciferol (VITAMIN D) 125 MCG (5000 UT) CAPS Take by mouth.  . dipyridamole-aspirin (AGGRENOX) 200-25 MG 12hr capsule TAKE 1 CAPSULE BY MOUTH EVERY DAY  . glimepiride (AMARYL) 1 MG tablet Take 1/2 tablet by mouth Monday, Wednesday, Friday  . glucosamine-chondroitin 500-400 MG tablet Take 1 tablet by mouth 3 (three) times daily.     [name] glucose blood test strip Use as instructed to check blood sugars up to 3 times a day.  Dx code e11.65  . irbesartan-hydrochlorothiazide (AVALIDE) 150-12.5 MG tablet Take 1 tablet by mouth daily.  . Multiple Vitamin (MULTIVITAMIN) capsule Take 1 capsule by mouth daily.    . nitroGLYCERIN (NITROSTAT) 0.4 MG SL tablet PLACE 1 TABLET UNDER THE TONGUE AT THE 1ST SIGN OF ATTACK, MAY REPEAT EVERY 5 MINUTES FOR 3 DOSES IN 15 MINUTES, IF PAIN PERSISTS, CALL 911  . rosuvastatin (CRESTOR) 5 MG tablet TAKE 1 TABLET(5 MG) BY MOUTH DAILY   No facility-administered encounter medications on file as of 08/12/2019.     Follow Up Plan: No SW follow up planned at this time. The patient will remain active with RN Care Manager.   Daneen Schick, BSW, CDP Social Worker, Certified Dementia Practitioner [place] [facility] [place] [facility] [phone]

## 2019-08-14 NOTE — Progress Notes (Signed)
This visit occurred during the SARS-CoV-2 public health emergency.  Safety protocols were in place, including screening questions prior to the visit, additional usage of staff PPE, and extensive cleaning of exam room while observing appropriate contact time as indicated for disinfecting solutions.  Subjective:     Patient ID: [name] , female    DOB: [birth year] , 86 y.o.   MRN: [id number]   Chief Complaint  Patient presents with  . Diabetes  . Hypertension    HPI  She presents today for DM/HTN check. She reports compliance with meds.  Diabetes She presents for her follow-up diabetic visit. She has type 2 diabetes mellitus. Her disease course has been stable. There are no hypoglycemic associated symptoms. Pertinent negatives for diabetes include no blurred vision. There are no hypoglycemic complications. Risk factors for coronary artery disease include dyslipidemia, hypertension, post-menopausal and diabetes mellitus. Current diabetic treatment includes oral agent (monotherapy).  Hypertension This is a chronic problem. The current episode started more than 1 year ago. The problem has been gradually improving since onset. The problem is controlled. Pertinent negatives include no blurred vision.     Past Medical History:  Diagnosis Date  . Cerebrovascular disease, unspecified   . Coronary atherosclerosis of unspecified type of vessel, native or graft   . Diabetes mellitus without complication ([place])   . Other and unspecified hyperlipidemia   . Rheumatoid arthritis(714.0)   . Shingles 2003  . Unspecified essential hypertension      Family History  Problem Relation Age of Onset  . Cancer Mother   . Hypertension Father   . Cancer Brother   . Hypertension Paternal Grandmother   . Heart disease Brother   . Diabetes Neg Hx   . Coronary artery disease Neg Hx      Current Outpatient Medications:  .  Accu-Chek Softclix Lancets lancets, Use as instructed to check blood sugars  up to 3 times a day  Dx code e11.65, Disp: 100 each, Rfl: 12 .  acetaminophen (TYLENOL) 650 MG CR tablet, Take 650 mg by mouth every 8 (eight) hours as needed for pain., Disp: , Rfl:  .  amLODipine (NORVASC) 5 MG tablet, Take 1 tablet (5 mg total) by mouth daily., Disp: 90 tablet, Rfl: 2 .  Blood Glucose Calibration (ACCU-CHEK AVIVA) SOLN, Use with accu-check aviva machine, Disp: 1 each, Rfl: 3 .  Blood Glucose Monitoring Suppl (ACCU-CHEK AVIVA PLUS) w/Device KIT, Use to check  Blood sugars up to 3 times a day.  Dx code e11.65, Disp: 1 kit, Rfl: 3 .  carvedilol (COREG) 12.5 MG tablet, TAKE 1 TABLET(12.5 MG) BY MOUTH TWICE DAILY WITH A MEAL, Disp: 180 tablet, Rfl: 1 .  Cholecalciferol (VITAMIN D) 125 MCG (5000 UT) CAPS, Take by mouth., Disp: , Rfl:  .  glimepiride (AMARYL) 1 MG tablet, Take 1/2 tablet by mouth Monday, Wednesday, Friday, Disp: 36 tablet, Rfl: 1 .  glucosamine-chondroitin 500-400 MG tablet, Take 1 tablet by mouth 3 (three) times daily.  , Disp: , Rfl:  .  glucose blood test strip, Use as instructed to check blood sugars up to 3 times a day.  Dx code e11.65, Disp: 100 each, Rfl: 12 .  irbesartan-hydrochlorothiazide (AVALIDE) 150-12.5 MG tablet, Take 1 tablet by mouth daily., Disp: 90 tablet, Rfl: 2 .  Multiple Vitamin (MULTIVITAMIN) capsule, Take 1 capsule by mouth daily.  , Disp: , Rfl:  .  nitroGLYCERIN (NITROSTAT) 0.4 MG SL tablet, PLACE 1 TABLET UNDER THE TONGUE AT THE 1ST SIGN OF  ATTACK, MAY REPEAT EVERY 5 MINUTES FOR 3 DOSES IN 15 MINUTES, IF PAIN PERSISTS, CALL 911, Disp: 50 tablet, Rfl: 0 .  rosuvastatin (CRESTOR) 5 MG tablet, TAKE 1 TABLET(5 MG) BY MOUTH DAILY, Disp: 90 tablet, Rfl: 1 .  dipyridamole-aspirin (AGGRENOX) 200-25 MG 12hr capsule, TAKE 1 CAPSULE BY MOUTH EVERY DAY, Disp: 180 capsule, Rfl: 0   No Known Allergies   Review of Systems  Constitutional: Negative.   Eyes: Negative for blurred vision.  Respiratory: Negative.   Cardiovascular: Negative.    Gastrointestinal: Negative.   Neurological: Negative.   Psychiatric/Behavioral: Negative.      Today's Vitals   08/03/19 1107  BP: 126/78  Pulse: 70  Temp: 98.8 F (37.1 C)  TempSrc: Oral  Weight: 139 lb (63 kg)  Height: 4' 11.8" (1.519 m)  PainSc: 0-No pain   Body mass index is 27.33 kg/m.   Objective:  Physical Exam Vitals and nursing note reviewed.  Constitutional:      Appearance: Normal appearance.  HENT:     Head: Normocephalic and atraumatic.  Cardiovascular:     Rate and Rhythm: Normal rate and regular rhythm.     Heart sounds: Normal heart sounds.  Pulmonary:     Effort: Pulmonary effort is normal.     Breath sounds: Normal breath sounds.  Skin:    General: Skin is warm.  Neurological:     General: No focal deficit present.     Mental Status: She is alert.  Psychiatric:        Mood and Affect: Mood normal.        Behavior: Behavior normal.         Assessment And Plan:     1. Type 2 diabetes mellitus with stage 4 chronic kidney disease, without long-term current use of insulin (HCC)  Chronic. I will check labs as listed below.  I will also check renal labs as well. I plan to also refer her to Renal. Encouraged to stay well hydrated.   - CBC no Diff - BMP8+EGFR - Hemoglobin A1c - Protein electrophoresis, serum - Phosphorus - Parathyroid Hormone, Intact w/Ca  2. Hypertensive heart and renal disease with renal failure, stage 1 through stage 4 or unspecified chronic kidney disease, without heart failure  Chronic, well controlled. She will continue with current meds. She is encouraged to avoid adding salt to her foods.   3. Atherosclerosis of native coronary artery of native heart without angina pectoris  Chronic. Importance of statin and dietary compliance, and following heart healthy diet was discussed with the patient.   [name], MD    THE PATIENT IS ENCOURAGED TO PRACTICE SOCIAL DISTANCING DUE TO THE COVID-19 PANDEMIC.

## 2019-08-18 ENCOUNTER — Telehealth: Payer: Self-pay

## 2019-08-18 DIAGNOSIS — G8929 Other chronic pain: Secondary | ICD-10-CM | POA: Diagnosis not present

## 2019-08-18 DIAGNOSIS — M160 Bilateral primary osteoarthritis of hip: Secondary | ICD-10-CM | POA: Diagnosis not present

## 2019-08-18 DIAGNOSIS — I129 Hypertensive chronic kidney disease with stage 1 through stage 4 chronic kidney disease, or unspecified chronic kidney disease: Secondary | ICD-10-CM | POA: Diagnosis not present

## 2019-08-18 DIAGNOSIS — M545 Low back pain: Secondary | ICD-10-CM | POA: Diagnosis not present

## 2019-08-18 DIAGNOSIS — E1122 Type 2 diabetes mellitus with diabetic chronic kidney disease: Secondary | ICD-10-CM | POA: Diagnosis not present

## 2019-08-18 DIAGNOSIS — I2510 Atherosclerotic heart disease of native coronary artery without angina pectoris: Secondary | ICD-10-CM | POA: Diagnosis not present

## 2019-08-18 DIAGNOSIS — N183 Chronic kidney disease, stage 3 unspecified: Secondary | ICD-10-CM | POA: Diagnosis not present

## 2019-08-18 DIAGNOSIS — M0609 Rheumatoid arthritis without rheumatoid factor, multiple sites: Secondary | ICD-10-CM | POA: Diagnosis not present

## 2019-08-18 DIAGNOSIS — I69354 Hemiplegia and hemiparesis following cerebral infarction affecting left non-dominant side: Secondary | ICD-10-CM | POA: Diagnosis not present

## 2019-08-18 NOTE — Telephone Encounter (Signed)
The pt wants to know if she should see a kidney doctor because of her CKD stage 3.

## 2019-08-22 DIAGNOSIS — N183 Chronic kidney disease, stage 3 unspecified: Secondary | ICD-10-CM | POA: Diagnosis not present

## 2019-08-22 DIAGNOSIS — I129 Hypertensive chronic kidney disease with stage 1 through stage 4 chronic kidney disease, or unspecified chronic kidney disease: Secondary | ICD-10-CM | POA: Diagnosis not present

## 2019-08-22 DIAGNOSIS — I69354 Hemiplegia and hemiparesis following cerebral infarction affecting left non-dominant side: Secondary | ICD-10-CM | POA: Diagnosis not present

## 2019-08-22 DIAGNOSIS — M545 Low back pain: Secondary | ICD-10-CM | POA: Diagnosis not present

## 2019-08-22 DIAGNOSIS — E1122 Type 2 diabetes mellitus with diabetic chronic kidney disease: Secondary | ICD-10-CM | POA: Diagnosis not present

## 2019-08-22 DIAGNOSIS — M160 Bilateral primary osteoarthritis of hip: Secondary | ICD-10-CM | POA: Diagnosis not present

## 2019-08-22 DIAGNOSIS — I2510 Atherosclerotic heart disease of native coronary artery without angina pectoris: Secondary | ICD-10-CM | POA: Diagnosis not present

## 2019-08-22 DIAGNOSIS — G8929 Other chronic pain: Secondary | ICD-10-CM | POA: Diagnosis not present

## 2019-08-22 DIAGNOSIS — M0609 Rheumatoid arthritis without rheumatoid factor, multiple sites: Secondary | ICD-10-CM | POA: Diagnosis not present

## 2019-08-25 DIAGNOSIS — E1122 Type 2 diabetes mellitus with diabetic chronic kidney disease: Secondary | ICD-10-CM | POA: Diagnosis not present

## 2019-08-25 DIAGNOSIS — I69354 Hemiplegia and hemiparesis following cerebral infarction affecting left non-dominant side: Secondary | ICD-10-CM | POA: Diagnosis not present

## 2019-08-25 DIAGNOSIS — I129 Hypertensive chronic kidney disease with stage 1 through stage 4 chronic kidney disease, or unspecified chronic kidney disease: Secondary | ICD-10-CM | POA: Diagnosis not present

## 2019-08-25 DIAGNOSIS — N183 Chronic kidney disease, stage 3 unspecified: Secondary | ICD-10-CM | POA: Diagnosis not present

## 2019-08-25 DIAGNOSIS — I2510 Atherosclerotic heart disease of native coronary artery without angina pectoris: Secondary | ICD-10-CM | POA: Diagnosis not present

## 2019-08-25 DIAGNOSIS — M545 Low back pain: Secondary | ICD-10-CM | POA: Diagnosis not present

## 2019-08-25 DIAGNOSIS — G8929 Other chronic pain: Secondary | ICD-10-CM | POA: Diagnosis not present

## 2019-08-25 DIAGNOSIS — M0609 Rheumatoid arthritis without rheumatoid factor, multiple sites: Secondary | ICD-10-CM | POA: Diagnosis not present

## 2019-08-25 DIAGNOSIS — M160 Bilateral primary osteoarthritis of hip: Secondary | ICD-10-CM | POA: Diagnosis not present

## 2019-08-30 ENCOUNTER — Telehealth: Payer: Self-pay

## 2019-08-30 ENCOUNTER — Other Ambulatory Visit: Payer: Self-pay

## 2019-08-30 DIAGNOSIS — E1122 Type 2 diabetes mellitus with diabetic chronic kidney disease: Secondary | ICD-10-CM

## 2019-08-30 NOTE — Telephone Encounter (Signed)
The pt was notified that it can take up to 2 months to get in to see the kidney doctor and that Dr Baird Cancer wanted the pt to know that the CKD isn't anything new. The pt was asked if Dr. Carlis Abbott every mentioned it and the pt said no that a nurse from her insurance came out and said something about it awhile back.

## 2019-09-01 DIAGNOSIS — M160 Bilateral primary osteoarthritis of hip: Secondary | ICD-10-CM | POA: Diagnosis not present

## 2019-09-01 DIAGNOSIS — I129 Hypertensive chronic kidney disease with stage 1 through stage 4 chronic kidney disease, or unspecified chronic kidney disease: Secondary | ICD-10-CM | POA: Diagnosis not present

## 2019-09-01 DIAGNOSIS — N183 Chronic kidney disease, stage 3 unspecified: Secondary | ICD-10-CM | POA: Diagnosis not present

## 2019-09-01 DIAGNOSIS — I69354 Hemiplegia and hemiparesis following cerebral infarction affecting left non-dominant side: Secondary | ICD-10-CM | POA: Diagnosis not present

## 2019-09-01 DIAGNOSIS — I2510 Atherosclerotic heart disease of native coronary artery without angina pectoris: Secondary | ICD-10-CM | POA: Diagnosis not present

## 2019-09-01 DIAGNOSIS — E1122 Type 2 diabetes mellitus with diabetic chronic kidney disease: Secondary | ICD-10-CM | POA: Diagnosis not present

## 2019-09-01 DIAGNOSIS — M0609 Rheumatoid arthritis without rheumatoid factor, multiple sites: Secondary | ICD-10-CM | POA: Diagnosis not present

## 2019-09-01 DIAGNOSIS — M545 Low back pain: Secondary | ICD-10-CM | POA: Diagnosis not present

## 2019-09-01 DIAGNOSIS — G8929 Other chronic pain: Secondary | ICD-10-CM | POA: Diagnosis not present

## 2019-09-05 ENCOUNTER — Telehealth: Payer: Medicare PPO

## 2019-09-05 ENCOUNTER — Other Ambulatory Visit: Payer: Self-pay

## 2019-09-05 ENCOUNTER — Ambulatory Visit: Payer: Self-pay

## 2019-09-05 DIAGNOSIS — I129 Hypertensive chronic kidney disease with stage 1 through stage 4 chronic kidney disease, or unspecified chronic kidney disease: Secondary | ICD-10-CM | POA: Diagnosis not present

## 2019-09-05 DIAGNOSIS — M069 Rheumatoid arthritis, unspecified: Secondary | ICD-10-CM

## 2019-09-05 DIAGNOSIS — E1122 Type 2 diabetes mellitus with diabetic chronic kidney disease: Secondary | ICD-10-CM

## 2019-09-05 DIAGNOSIS — I1310 Hypertensive heart and chronic kidney disease without heart failure, with stage 1 through stage 4 chronic kidney disease, or unspecified chronic kidney disease: Secondary | ICD-10-CM

## 2019-09-05 DIAGNOSIS — M545 Low back pain: Secondary | ICD-10-CM | POA: Diagnosis not present

## 2019-09-05 DIAGNOSIS — I69354 Hemiplegia and hemiparesis following cerebral infarction affecting left non-dominant side: Secondary | ICD-10-CM | POA: Diagnosis not present

## 2019-09-05 DIAGNOSIS — M160 Bilateral primary osteoarthritis of hip: Secondary | ICD-10-CM | POA: Diagnosis not present

## 2019-09-05 DIAGNOSIS — G8929 Other chronic pain: Secondary | ICD-10-CM | POA: Diagnosis not present

## 2019-09-05 DIAGNOSIS — M0609 Rheumatoid arthritis without rheumatoid factor, multiple sites: Secondary | ICD-10-CM | POA: Diagnosis not present

## 2019-09-05 DIAGNOSIS — N183 Chronic kidney disease, stage 3 unspecified: Secondary | ICD-10-CM | POA: Diagnosis not present

## 2019-09-05 DIAGNOSIS — I2510 Atherosclerotic heart disease of native coronary artery without angina pectoris: Secondary | ICD-10-CM | POA: Diagnosis not present

## 2019-09-06 DIAGNOSIS — M545 Low back pain: Secondary | ICD-10-CM | POA: Diagnosis not present

## 2019-09-06 DIAGNOSIS — E785 Hyperlipidemia, unspecified: Secondary | ICD-10-CM | POA: Diagnosis not present

## 2019-09-06 DIAGNOSIS — M160 Bilateral primary osteoarthritis of hip: Secondary | ICD-10-CM | POA: Diagnosis not present

## 2019-09-06 DIAGNOSIS — G8929 Other chronic pain: Secondary | ICD-10-CM | POA: Diagnosis not present

## 2019-09-06 DIAGNOSIS — Z951 Presence of aortocoronary bypass graft: Secondary | ICD-10-CM | POA: Diagnosis not present

## 2019-09-06 DIAGNOSIS — Z7984 Long term (current) use of oral hypoglycemic drugs: Secondary | ICD-10-CM | POA: Diagnosis not present

## 2019-09-06 NOTE — Chronic Care Management (AMB) (Addendum)
  Chronic Care Management   Outreach Note  09/06/2019 Name: [name] MRN: [id number] DOB: 1927-06-28  Referred by: Glendale Chard, MD Reason for referral : Chronic Care Management (FU RN CM Call )   An unsuccessful telephone outreach was attempted today. The patient was referred to the case management team for assistance with care management and care coordination.   Follow Up Plan: Telephone follow up appointment with care management team member scheduled for: 11/08/19  Barb Merino, RN, BSN, CCM Care Management Coordinator [place] [facility]  Direct Phone: [phone]

## 2019-09-14 DIAGNOSIS — E1165 Type 2 diabetes mellitus with hyperglycemia: Secondary | ICD-10-CM | POA: Diagnosis not present

## 2019-09-22 ENCOUNTER — Encounter: Payer: Medicare Other | Admitting: Internal Medicine

## 2019-09-22 ENCOUNTER — Ambulatory Visit: Payer: Medicare Other

## 2019-10-12 DIAGNOSIS — D631 Anemia in chronic kidney disease: Secondary | ICD-10-CM | POA: Diagnosis not present

## 2019-10-12 DIAGNOSIS — N2581 Secondary hyperparathyroidism of renal origin: Secondary | ICD-10-CM | POA: Diagnosis not present

## 2019-10-12 DIAGNOSIS — I129 Hypertensive chronic kidney disease with stage 1 through stage 4 chronic kidney disease, or unspecified chronic kidney disease: Secondary | ICD-10-CM | POA: Diagnosis not present

## 2019-10-12 DIAGNOSIS — R778 Other specified abnormalities of plasma proteins: Secondary | ICD-10-CM | POA: Diagnosis not present

## 2019-10-12 DIAGNOSIS — N184 Chronic kidney disease, stage 4 (severe): Secondary | ICD-10-CM | POA: Diagnosis not present

## 2019-10-14 ENCOUNTER — Other Ambulatory Visit: Payer: Self-pay | Admitting: Nephrology

## 2019-10-14 DIAGNOSIS — N184 Chronic kidney disease, stage 4 (severe): Secondary | ICD-10-CM

## 2019-10-19 ENCOUNTER — Telehealth: Payer: Medicare PPO

## 2019-10-19 ENCOUNTER — Ambulatory Visit
Admission: RE | Admit: 2019-10-19 | Discharge: 2019-10-19 | Disposition: A | Discharge status: Home / Self Care | Payer: Medicare PPO | Source: Ambulatory Visit | Attending: Nephrology | Admitting: Nephrology

## 2019-10-19 DIAGNOSIS — N184 Chronic kidney disease, stage 4 (severe): Secondary | ICD-10-CM | POA: Diagnosis not present

## 2019-10-19 DIAGNOSIS — N281 Cyst of kidney, acquired: Secondary | ICD-10-CM | POA: Diagnosis not present

## 2019-10-19 IMAGING — US US RENAL
1 series · 13 of 25 positions shown · non-contrast
Comparison: None.

CLINICAL DATA: [AGE] with chronic kidney disease. Chronic
kidney disease stage 4, GFR 15-29 mL/minute.

EXAM:
RENAL / URINARY TRACT ULTRASOUND COMPLETE

[Series 1: us renal · 0.23mm/px · 13 of 47 slices shown]
[im 1/47]
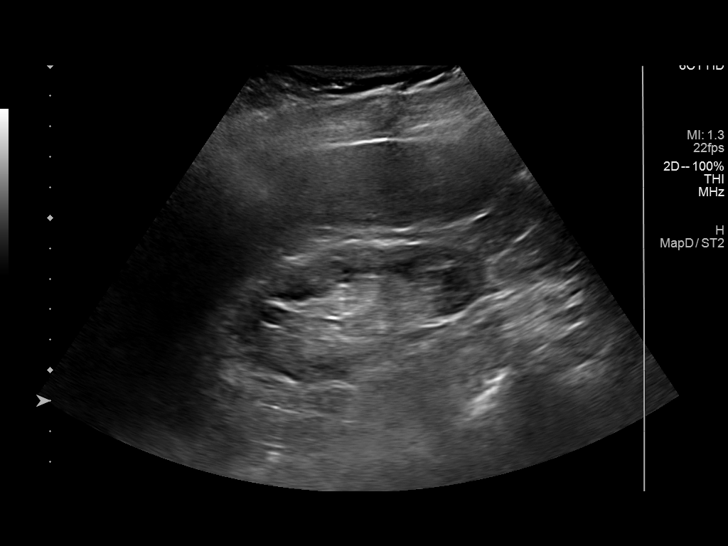
[im 4/47]
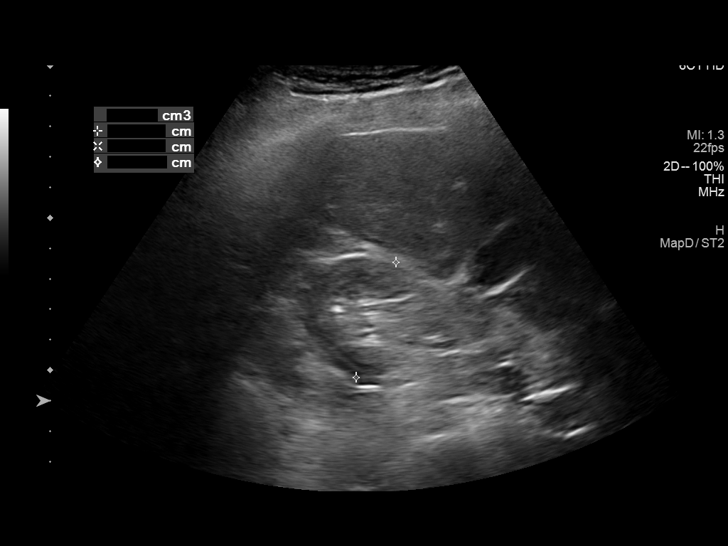
[im 8/47]
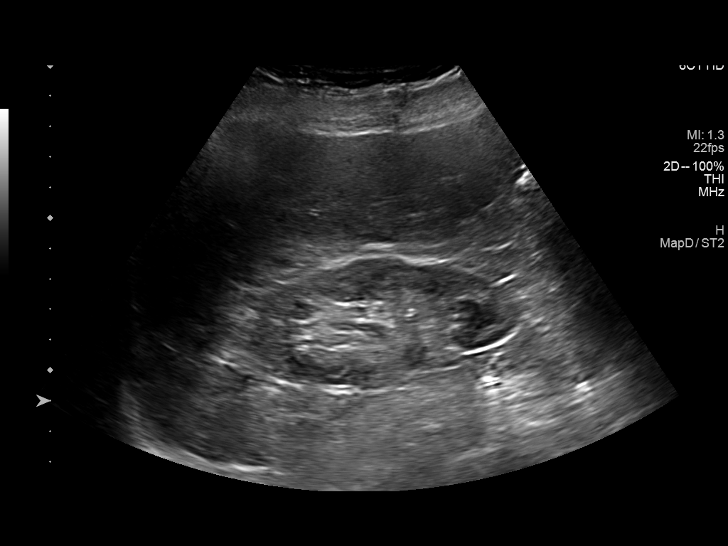
[im 12/47]
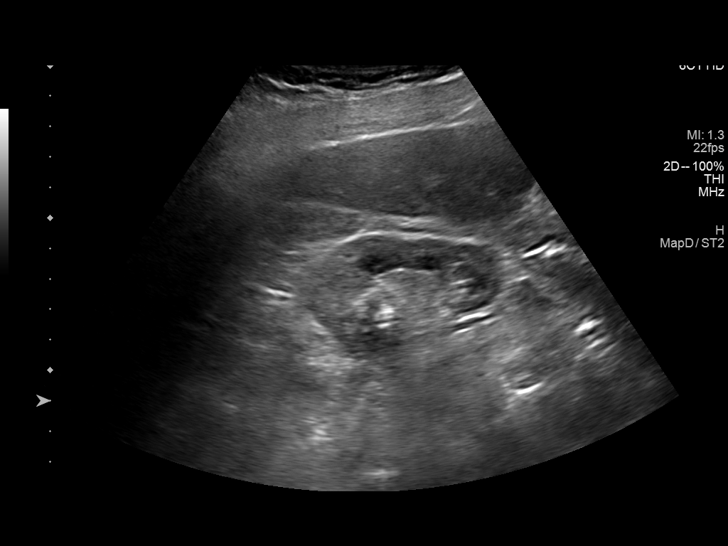
[im 16/47]
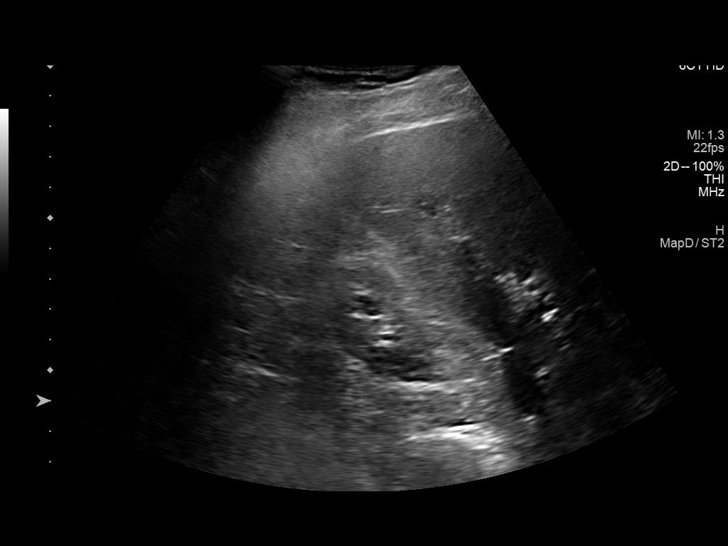
[im 20/47]
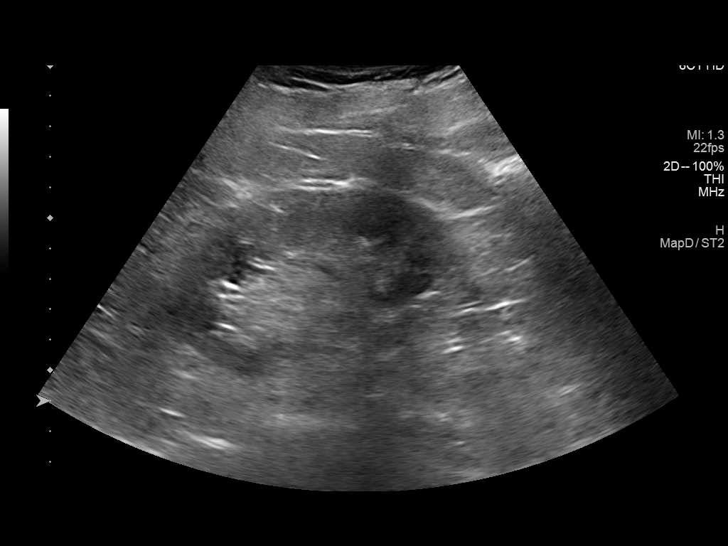
[im 24/47]
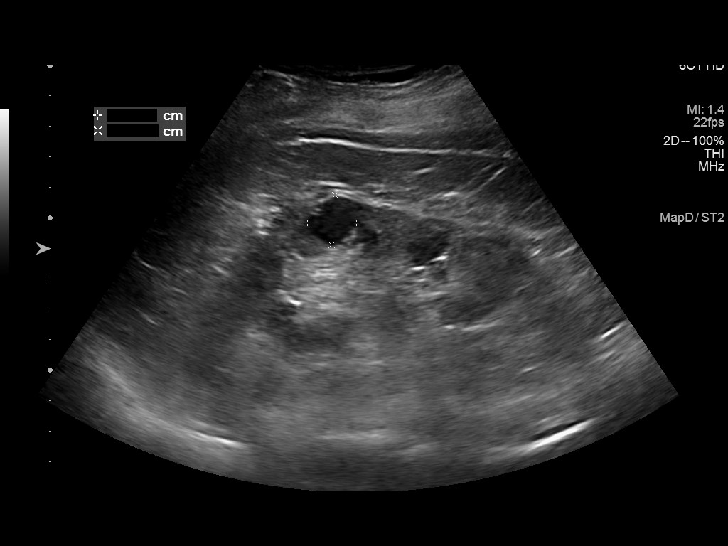
[im 27/47]
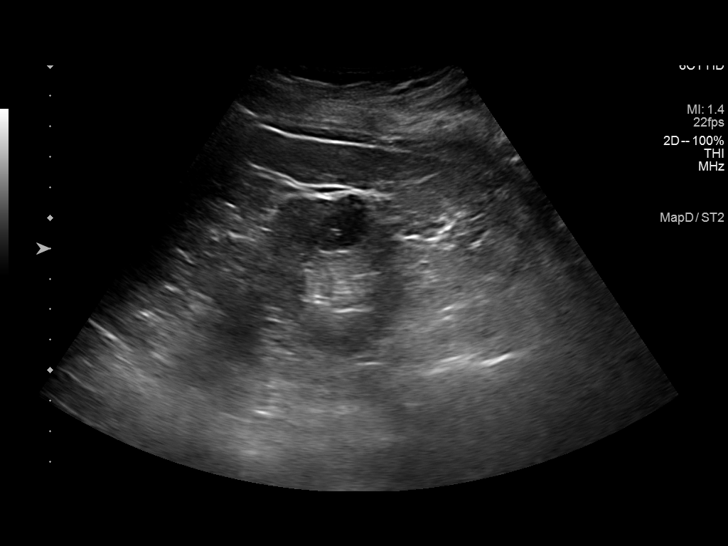
[im 31/47]
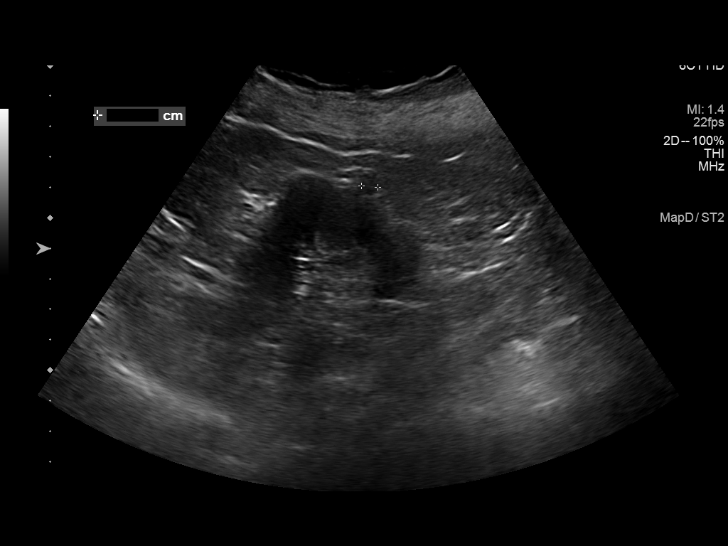
[im 35/47]
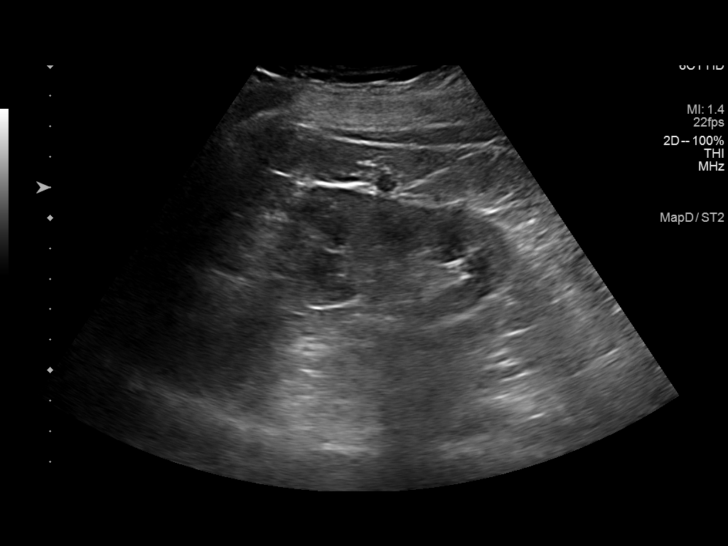
[im 39/47]
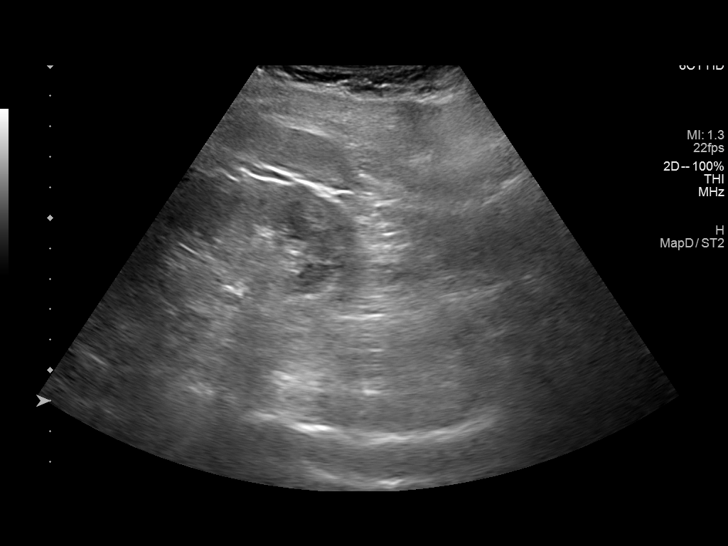
[im 43/47]
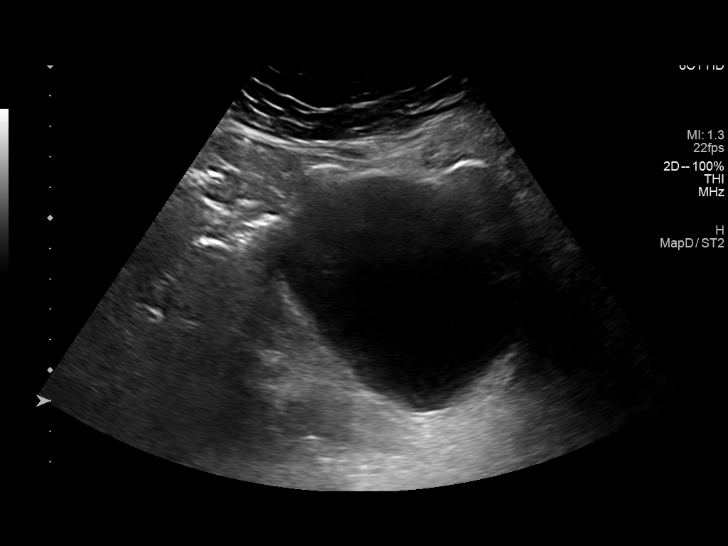
[im 47/47]
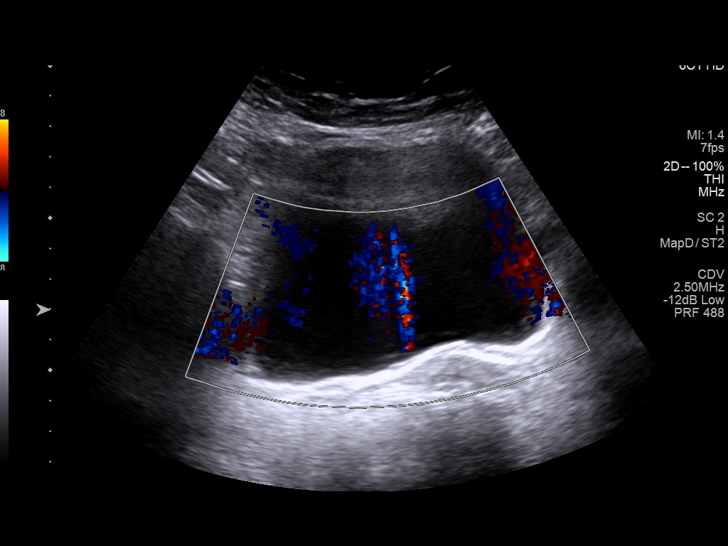

[13 of 25 positions shown; findings below may reference images not displayed]

FINDINGS: Right Kidney:

Renal measurements: 9.1 x 3.7 x 4.0 cm = volume: 71 mL. Increased
renal parenchymal echogenicity and cortical thinning. No mass or
hydronephrosis visualized. No evidence of shadowing calculus.

Left Kidney:

Renal measurements: 9.8 x 5.6 x 5.0 cm = volume: 144 mL. Mild
increased renal parenchymal echogenicity. Hypoechoic structure in
the mid kidney measuring 1.6 x 1.6 x 1.1 cm has increased through
transmission and is likely a complex cyst with possible internal
septations. There is a 7 mm exophytic cortical nodule in the mid
kidney that is difficult to accurately characterize given size. No
hydronephrosis visualized. No evidence of shadowing calculus.

Bladder:

Appears normal for degree of bladder distention.

Other:

None.
IMPRESSION: 1. Increased bilateral renal parenchymal echogenicity with thinning
of the right renal parenchyma consistent with chronic medical renal
disease. No obstructive uropathy.
2. Complex cyst in the left mid kidney measuring 1.6 cm. A 7 mm
exophytic nodule is from the mid left kidney is too small to
accurately characterize.

## 2019-10-24 ENCOUNTER — Other Ambulatory Visit: Payer: Self-pay | Admitting: Internal Medicine

## 2019-10-24 DIAGNOSIS — I10 Essential (primary) hypertension: Secondary | ICD-10-CM

## 2019-11-03 ENCOUNTER — Other Ambulatory Visit: Payer: Self-pay | Admitting: Nephrology

## 2019-11-03 DIAGNOSIS — N281 Cyst of kidney, acquired: Secondary | ICD-10-CM

## 2019-11-07 ENCOUNTER — Ambulatory Visit (INDEPENDENT_AMBULATORY_CARE_PROVIDER_SITE_OTHER): Payer: Medicare PPO | Admitting: Internal Medicine

## 2019-11-07 ENCOUNTER — Encounter: Payer: Medicare PPO | Admitting: Internal Medicine

## 2019-11-07 ENCOUNTER — Encounter: Payer: Self-pay | Admitting: Internal Medicine

## 2019-11-07 ENCOUNTER — Other Ambulatory Visit: Payer: Self-pay

## 2019-11-07 VITALS — BP 122/60 | HR 74 | Temp 98.1°F | Ht 60.4 in | Wt 139.2 lb

## 2019-11-07 DIAGNOSIS — E663 Overweight: Secondary | ICD-10-CM

## 2019-11-07 DIAGNOSIS — E1122 Type 2 diabetes mellitus with diabetic chronic kidney disease: Secondary | ICD-10-CM | POA: Diagnosis not present

## 2019-11-07 DIAGNOSIS — E559 Vitamin D deficiency, unspecified: Secondary | ICD-10-CM | POA: Diagnosis not present

## 2019-11-07 DIAGNOSIS — Z23 Encounter for immunization: Secondary | ICD-10-CM

## 2019-11-07 DIAGNOSIS — R350 Frequency of micturition: Secondary | ICD-10-CM | POA: Diagnosis not present

## 2019-11-07 DIAGNOSIS — I2510 Atherosclerotic heart disease of native coronary artery without angina pectoris: Secondary | ICD-10-CM | POA: Diagnosis not present

## 2019-11-07 DIAGNOSIS — N183 Chronic kidney disease, stage 3 unspecified: Secondary | ICD-10-CM | POA: Diagnosis not present

## 2019-11-07 DIAGNOSIS — Z6825 Body mass index (BMI) 25.0-25.9, adult: Secondary | ICD-10-CM

## 2019-11-07 DIAGNOSIS — I1310 Hypertensive heart and chronic kidney disease without heart failure, with stage 1 through stage 4 chronic kidney disease, or unspecified chronic kidney disease: Secondary | ICD-10-CM | POA: Diagnosis not present

## 2019-11-07 DIAGNOSIS — Z0000 Encounter for general adult medical examination without abnormal findings: Secondary | ICD-10-CM | POA: Diagnosis not present

## 2019-11-07 LAB — POCT URINALYSIS DIPSTICK
Bilirubin, UA: NEGATIVE
Blood, UA: NEGATIVE
Glucose, UA: NEGATIVE
Ketones, UA: NEGATIVE
Nitrite, UA: NEGATIVE
Protein, UA: NEGATIVE
Spec Grav, UA: 1.02 (ref 1.010–1.025)
Urobilinogen, UA: 0.2 E.U./dL
pH, UA: 6 (ref 5.0–8.0)

## 2019-11-07 LAB — POCT UA - MICROALBUMIN
Creatinine, POC: 100 mg/dL
Microalbumin Ur, POC: 80 mg/L

## 2019-11-07 NOTE — Progress Notes (Signed)
I,Tianna Badgett,acting as a Education administrator for [name], MD.,have documented all relevant documentation on the behalf of [name], MD,as directed by  [name], MD while in the presence of [name], MD.  This visit occurred during the SARS-CoV-2 public health emergency.  Safety protocols were in place, including screening questions prior to the visit, additional usage of staff PPE, and extensive cleaning of exam room while observing appropriate contact time as indicated for disinfecting solutions.  Subjective:     Patient ID: [place] , female    DOB: 11-Jan-1928 , 86 y.o.   MRN: [id number]   Chief Complaint  Patient presents with  . Annual Exam  . Diabetes  . Hypertension    HPI  She is here today for a full physical examination. She does not have any specific concerns or complaints at this time.  She denies headaches, chest pain and shortness of breath.   Diabetes She presents for her follow-up diabetic visit. She has type 2 diabetes mellitus. There are no hypoglycemic associated symptoms. Pertinent negatives for diabetes include no blurred vision and no chest pain. There are no hypoglycemic complications. Risk factors for coronary artery disease include diabetes mellitus, hypertension, post-menopausal and sedentary lifestyle. She is compliant with treatment most of the time. She is following a diabetic diet. She never participates in exercise. Her home blood glucose trend is fluctuating minimally. An ACE inhibitor/angiotensin II receptor blocker is not being taken. Eye exam is not current.  Hypertension This is a chronic problem. The current episode started more than 1 year ago. The problem has been gradually improving since onset. The problem is controlled. Pertinent negatives include no blurred vision, chest pain, palpitations or shortness of breath. Past treatments include calcium channel blockers. The current treatment provides moderate improvement. Hypertensive  end-organ damage includes kidney disease.     Past Medical History:  Diagnosis Date  . Cerebrovascular disease, unspecified   . Coronary atherosclerosis of unspecified type of vessel, native or graft   . Diabetes mellitus without complication ([place])   . Other and unspecified hyperlipidemia   . Rheumatoid arthritis(714.0)   . Shingles 2003  . Unspecified essential hypertension      Family History  Problem Relation Age of Onset  . Cancer Mother   . Hypertension Father   . Cancer Brother   . Hypertension Paternal Grandmother   . Heart disease Brother   . Diabetes Neg Hx   . Coronary artery disease Neg Hx      Current Outpatient Medications:  .  Accu-Chek Softclix Lancets lancets, Use as instructed to check blood sugars up to 3 times a day  Dx code e11.65, Disp: 100 each, Rfl: 12 .  acetaminophen (TYLENOL) 650 MG CR tablet, Take 650 mg by mouth every 8 (eight) hours as needed for pain., Disp: , Rfl:  .  amLODipine (NORVASC) 5 MG tablet, TAKE 1 TABLET(5 MG) BY MOUTH DAILY, Disp: 90 tablet, Rfl: 2 .  Blood Glucose Calibration (ACCU-CHEK AVIVA) SOLN, Use with accu-check aviva machine, Disp: 1 each, Rfl: 3 .  Blood Glucose Monitoring Suppl (ACCU-CHEK AVIVA PLUS) w/Device KIT, Use to check  Blood sugars up to 3 times a day.  Dx code e11.65, Disp: 1 kit, Rfl: 3 .  carvedilol (COREG) 12.5 MG tablet, TAKE 1 TABLET(12.5 MG) BY MOUTH TWICE DAILY WITH A MEAL, Disp: 180 tablet, Rfl: 1 .  chlorthalidone (HYGROTON) 25 MG tablet, , Disp: , Rfl:  .  Cholecalciferol (VITAMIN D) 125 MCG (  5000 UT) CAPS, Take by mouth., Disp: , Rfl:  .  dipyridamole-aspirin (AGGRENOX) 200-25 MG 12hr capsule, TAKE 1 CAPSULE BY MOUTH EVERY DAY, Disp: 180 capsule, Rfl: 0 .  glimepiride (AMARYL) 1 MG tablet, TAKE 1/2 TABLET BY MOUTH ON MONDAY, WEDNESDAY AND FRIDAY, Disp: 36 tablet, Rfl: 1 .  glucosamine-chondroitin 500-400 MG tablet, Take 1 tablet by mouth 3 (three) times daily.  , Disp: , Rfl:  .  glucose blood test strip,  Use as instructed to check blood sugars up to 3 times a day.  Dx code e11.65, Disp: 100 each, Rfl: 12 .  Multiple Vitamin (MULTIVITAMIN) capsule, Take 1 capsule by mouth daily.  , Disp: , Rfl:  .  nitroGLYCERIN (NITROSTAT) 0.4 MG SL tablet, PLACE 1 TABLET UNDER THE TONGUE AT THE 1ST SIGN OF ATTACK, MAY REPEAT EVERY 5 MINUTES FOR 3 DOSES IN 15 MINUTES, IF PAIN PERSISTS, CALL 911, Disp: 50 tablet, Rfl: 0 .  rosuvastatin (CRESTOR) 5 MG tablet, TAKE 1 TABLET(5 MG) BY MOUTH DAILY, Disp: 90 tablet, Rfl: 1 .  cephALEXin (KEFLEX) 250 MG capsule, Take one tablet by mouth every 12 hours for 5 day., Disp: 10 capsule, Rfl: 0 .  irbesartan (AVAPRO) 150 MG tablet, Take 1 tablet (150 mg total) by mouth daily., Disp: 30 tablet, Rfl: 11   No Known Allergies    The patient states she uses post menopausal status for birth control. Last LMP was No LMP recorded. Patient is postmenopausal.. Negative for Dysmenorrhea. Negative for: breast discharge, breast lump(s), breast pain and breast self exam. Associated symptoms include abnormal vaginal bleeding. Pertinent negatives include abnormal bleeding (hematology), anxiety, decreased libido, depression, difficulty falling sleep, dyspareunia, history of infertility, nocturia, sexual dysfunction, sleep disturbances, urinary incontinence, urinary urgency, vaginal discharge and vaginal itching. Diet regular.The patient states her exercise level is  minimal.   . The patient's tobacco use is:  Social History   Tobacco Use  Smoking Status Never Smoker  Smokeless Tobacco Never Used  . She has been exposed to passive smoke. The patient's alcohol use is:  Social History   Substance and Sexual Activity  Alcohol Use No    Review of Systems  Constitutional: Negative.   HENT: Negative.   Eyes: Negative.  Negative for blurred vision.  Respiratory: Negative.  Negative for shortness of breath.   Cardiovascular: Negative.  Negative for chest pain and palpitations.   Gastrointestinal: Negative.   Endocrine: Negative.   Genitourinary: Positive for frequency.  Musculoskeletal: Negative.   Skin: Negative.   Allergic/Immunologic: Negative.   Neurological: Negative.   Hematological: Negative.   Psychiatric/Behavioral: Negative.      Today's Vitals   11/07/19 1448  BP: 122/60  Pulse: 74  Temp: 98.1 F (36.7 C)  TempSrc: Oral  Weight: 139 lb 3.2 oz (63.1 kg)  Height: 5' 0.4" (1.534 m)   Body mass index is 26.83 kg/m.   Objective:  Physical Exam Vitals and nursing note reviewed.  Constitutional:      Appearance: Normal appearance.  HENT:     Head: Normocephalic and atraumatic.     Right Ear: Tympanic membrane, ear canal and external ear normal.     Left Ear: Tympanic membrane, ear canal and external ear normal.     Nose:     Comments: Deferred, masked    Mouth/Throat:     Comments: Deferred, masked Eyes:     Extraocular Movements: Extraocular movements intact.     Conjunctiva/sclera: Conjunctivae normal.     Pupils: Pupils are equal,  round, and reactive to light.  Cardiovascular:     Rate and Rhythm: Normal rate and regular rhythm.     Pulses:          Dorsalis pedis pulses are 1+ on the right side and 1+ on the left side.     Heart sounds: Normal heart sounds.  Pulmonary:     Effort: Pulmonary effort is normal.     Breath sounds: Normal breath sounds.  Chest:     Breasts: Tanner Score is 5.        Right: Normal.        Left: Normal.     Comments: Healed sternal scar Abdominal:     General: Abdomen is flat. Bowel sounds are normal.     Palpations: Abdomen is soft.  Genitourinary:    Comments: deferred Musculoskeletal:        General: Normal range of motion.     Cervical back: Normal range of motion and neck supple.  Feet:     Right foot:     Protective Sensation: 5 sites tested. 5 sites sensed.     Skin integrity: Dry skin present.     Toenail Condition: Right toenails are long.     Left foot:     Protective  Sensation: 5 sites tested. 5 sites sensed.     Skin integrity: Dry skin present.     Toenail Condition: Left toenails are long.  Skin:    General: Skin is warm and dry.  Neurological:     General: No focal deficit present.     Mental Status: She is alert and oriented to person, place, and time.  Psychiatric:        Mood and Affect: Mood normal.        Behavior: Behavior normal.         Assessment And Plan:     1. Routine general medical examination at health care facility Comments: A full exam was performed. Importance of monthly self breast exams was discussed with the patient. PATIENT IS ADVISED TO GET 30-45 MINUTES REGULAR EXERCISE NO LESS THAN FOUR TO FIVE DAYS PER WEEK - BOTH WEIGHTBEARING EXERCISES AND AEROBIC ARE RECOMMENDED.  PATIENT IS ADVISED TO FOLLOW A HEALTHY DIET WITH AT LEAST SIX FRUITS/VEGGIES PER DAY, DECREASE INTAKE OF RED MEAT, AND TO INCREASE FISH INTAKE TO TWO DAYS PER WEEK.  MEATS/FISH SHOULD NOT BE FRIED, BAKED OR BROILED IS PREFERABLE.  I SUGGEST WEARING SPF 50 SUNSCREEN ON EXPOSED PARTS AND ESPECIALLY WHEN IN THE DIRECT SUNLIGHT FOR AN EXTENDED PERIOD OF TIME.  PLEASE AVOID FAST FOOD RESTAURANTS AND INCREASE YOUR WATER INTAKE.  2. Type 2 diabetes mellitus with stage 3 chronic kidney disease, without long-term current use of insulin, unspecified whether stage 3a or 3b CKD ([place]) Comments: Diabetic foot exam was performed. I DISCUSSED WITH THE PATIENT AT LENGTH REGARDING THE GOALS OF GLYCEMIC CONTROL AND POSSIBLE LONG-TERM COMPLICATIONS.  I  ALSO STRESSED THE IMPORTANCE OF COMPLIANCE WITH HOME GLUCOSE MONITORING, DIETARY RESTRICTIONS INCLUDING AVOIDANCE OF SUGARY DRINKS/PROCESSED FOODS,  ALONG WITH REGULAR EXERCISE.  I  ALSO STRESSED THE IMPORTANCE OF ANNUAL EYE EXAMS, SELF FOOT CARE AND COMPLIANCE WITH OFFICE VISITS.  - CBC - Hemoglobin A1c - CMP14+EGFR - Lipid panel - POCT UA - Microalbumin - POCT urinalysis dipstick  3. Hypertensive heart and renal disease with  renal failure, stage 1 through stage 4 or unspecified chronic kidney disease, without heart failure Comments: Chronic, well controlled. She will continue with current meds. She is  encouraged to avoid adding salt to her foods. EKG performed, NSR w/o acute changes. - Lipid panel - POCT UA - Microalbumin - POCT urinalysis dipstick - EKG 12-Lead  4. Atherosclerosis of native coronary artery of native heart without angina pectoris Comments: Chronic, yet stable. S/p CABG 2003.   5. Urinary frequency Comments: I will check urinalysis. If suggestive of UTI, I will send off urine culture.  - Culture, Urine  6. Vitamin D deficiency Comments: I will check vitamin D level and supplement as needed.   7. Immunization due Comments: She was given high dose flu vaccine.  - Flu Vaccine QUAD High Dose(Fluad)  8. Overweight (BMI 25.0-29.9) Comments: Her BMI is acceptable for her demographic. She is still encouraged to increase daily actiivty, advised to perform chair exercises while watching TV.  . Wt Readings from Last 3 Encounters:  11/09/19 139 lb (63 kg)  11/07/19 139 lb 3.2 oz (63.1 kg)  08/03/19 139 lb (63 kg)       Patient was given opportunity to ask questions. Patient verbalized understanding of the plan and was able to repeat key elements of the plan. All questions were answered to their satisfaction.   [name], MD   I, [name], MD, have reviewed all documentation for this visit. The documentation on 12/04/19 for the exam, diagnosis, procedures, and orders are all accurate and complete.  THE PATIENT IS ENCOURAGED TO PRACTICE SOCIAL DISTANCING DUE TO THE COVID-19 PANDEMIC.

## 2019-11-07 NOTE — Patient Instructions (Signed)
Health Maintenance After Age 84 After age 84, you are at a higher risk for certain long-term diseases and infections as well as injuries from falls. Falls are a major cause of broken bones and head injuries in people who are older than age 84. Getting regular preventive care can help to keep you healthy and well. Preventive care includes getting regular testing and making lifestyle changes as recommended by your health care provider. Talk with your health care provider about:  Which screenings and tests you should have. A screening is a test that checks for a disease when you have no symptoms.  A diet and exercise plan that is right for you. What should I know about screenings and tests to prevent falls? Screening and testing are the best ways to find a health problem early. Early diagnosis and treatment give you the best chance of managing medical conditions that are common after age 84. Certain conditions and lifestyle choices may make you more likely to have a fall. Your health care provider may recommend:  Regular vision checks. Poor vision and conditions such as cataracts can make you more likely to have a fall. If you wear glasses, make sure to get your prescription updated if your vision changes.  Medicine review. Work with your health care provider to regularly review all of the medicines you are taking, including over-the-counter medicines. Ask your health care provider about any side effects that may make you more likely to have a fall. Tell your health care provider if any medicines that you take make you feel dizzy or sleepy.  Osteoporosis screening. Osteoporosis is a condition that causes the bones to get weaker. This can make the bones weak and cause them to break more easily.  Blood pressure screening. Blood pressure changes and medicines to control blood pressure can make you feel dizzy.  Strength and balance checks. Your health care provider may recommend certain tests to check your  strength and balance while standing, walking, or changing positions.  Foot health exam. Foot pain and numbness, as well as not wearing proper footwear, can make you more likely to have a fall.  Depression screening. You may be more likely to have a fall if you have a fear of falling, feel emotionally low, or feel unable to do activities that you used to do.  Alcohol use screening. Using too much alcohol can affect your balance and may make you more likely to have a fall. What actions can I take to lower my risk of falls? General instructions  Talk with your health care provider about your risks for falling. Tell your health care provider if: ? You fall. Be sure to tell your health care provider about all falls, even ones that seem minor. ? You feel dizzy, sleepy, or off-balance.  Take over-the-counter and prescription medicines only as told by your health care provider. These include any supplements.  Eat a healthy diet and maintain a healthy weight. A healthy diet includes low-fat dairy products, low-fat (lean) meats, and fiber from whole grains, beans, and lots of fruits and vegetables. Home safety  Remove any tripping hazards, such as rugs, cords, and clutter.  Install safety equipment such as grab bars in bathrooms and safety rails on stairs.  Keep rooms and walkways well-lit. Activity   Follow a regular exercise program to stay fit. This will help you maintain your balance. Ask your health care provider what types of exercise are appropriate for you.  If you need a cane or   walker, use it as recommended by your health care provider.  Wear supportive shoes that have nonskid soles. Lifestyle  Do not drink alcohol if your health care provider tells you not to drink.  If you drink alcohol, limit how much you have: ? 0-1 drink a day for women. ? 0-2 drinks a day for men.  Be aware of how much alcohol is in your drink. In the [place] one drink equals one typical bottle of beer (12  oz), one-half glass of wine (5 oz), or one shot of hard liquor (1 oz).  Do not use any products that contain nicotine or tobacco, such as cigarettes and e-cigarettes. If you need help quitting, ask your health care provider. Summary  Having a healthy lifestyle and getting preventive care can help to protect your health and wellness after age 84.  Screening and testing are the best way to find a health problem early and help you avoid having a fall. Early diagnosis and treatment give you the best chance for managing medical conditions that are more common for people who are older than age 84.  Falls are a major cause of broken bones and head injuries in people who are older than age 84. Take precautions to prevent a fall at home.  Work with your health care provider to learn what changes you can make to improve your health and wellness and to prevent falls. This information is not intended to replace advice given to you by your health care provider. Make sure you discuss any questions you have with your health care provider. Document Revised: 05/27/2018 Document Reviewed: 12/17/2016 [employer]vier Patient Education  2020 [employer]  

## 2019-11-08 ENCOUNTER — Other Ambulatory Visit: Payer: Self-pay

## 2019-11-08 ENCOUNTER — Telehealth: Payer: Medicare PPO

## 2019-11-08 DIAGNOSIS — R32 Unspecified urinary incontinence: Secondary | ICD-10-CM | POA: Diagnosis not present

## 2019-11-08 DIAGNOSIS — I2510 Atherosclerotic heart disease of native coronary artery without angina pectoris: Secondary | ICD-10-CM | POA: Diagnosis not present

## 2019-11-08 DIAGNOSIS — I252 Old myocardial infarction: Secondary | ICD-10-CM | POA: Diagnosis not present

## 2019-11-08 DIAGNOSIS — M1990 Unspecified osteoarthritis, unspecified site: Secondary | ICD-10-CM | POA: Diagnosis not present

## 2019-11-08 DIAGNOSIS — N189 Chronic kidney disease, unspecified: Secondary | ICD-10-CM | POA: Diagnosis not present

## 2019-11-08 DIAGNOSIS — E1122 Type 2 diabetes mellitus with diabetic chronic kidney disease: Secondary | ICD-10-CM | POA: Diagnosis not present

## 2019-11-08 DIAGNOSIS — Z8673 Personal history of transient ischemic attack (TIA), and cerebral infarction without residual deficits: Secondary | ICD-10-CM | POA: Diagnosis not present

## 2019-11-08 DIAGNOSIS — E785 Hyperlipidemia, unspecified: Secondary | ICD-10-CM | POA: Diagnosis not present

## 2019-11-08 DIAGNOSIS — I129 Hypertensive chronic kidney disease with stage 1 through stage 4 chronic kidney disease, or unspecified chronic kidney disease: Secondary | ICD-10-CM | POA: Diagnosis not present

## 2019-11-08 DIAGNOSIS — Z7902 Long term (current) use of antithrombotics/antiplatelets: Secondary | ICD-10-CM | POA: Diagnosis not present

## 2019-11-08 LAB — CBC
Hematocrit: 31.9 % — ABNORMAL LOW (ref 34.0–46.6)
Hemoglobin: 10.7 g/dL — ABNORMAL LOW (ref 11.1–15.9)
MCH: 32.3 pg (ref 26.6–33.0)
MCHC: 33.5 g/dL (ref 31.5–35.7)
MCV: 96 fL (ref 79–97)
Platelets: 191 10*3/uL (ref 150–450)
RBC: 3.31 x10E6/uL — ABNORMAL LOW (ref 3.77–5.28)
RDW: 13 % (ref 11.7–15.4)
WBC: 4.2 10*3/uL (ref 3.4–10.8)

## 2019-11-08 LAB — LIPID PANEL
Chol/HDL Ratio: 4.4 ratio (ref 0.0–4.4)
Cholesterol, Total: 141 mg/dL (ref 100–199)
HDL: 32 mg/dL — ABNORMAL LOW (ref >39)
LDL Chol Calc (NIH): 85 mg/dL (ref 0–99)
Triglycerides: 136 mg/dL (ref 0–149)
VLDL Cholesterol Cal: 24 mg/dL (ref 5–40)

## 2019-11-08 LAB — CMP14+EGFR
ALT: 9 IU/L (ref 0–32)
AST: 17 IU/L (ref 0–40)
Albumin/Globulin Ratio: 1.5 (ref 1.2–2.2)
Albumin: 4.3 g/dL (ref 3.5–4.6)
Alkaline Phosphatase: 78 IU/L (ref 44–121)
BUN/Creatinine Ratio: 23 (ref 12–28)
BUN: 41 mg/dL — ABNORMAL HIGH (ref 10–36)
Bilirubin Total: 0.3 mg/dL (ref 0.0–1.2)
CO2: 23 mmol/L (ref 20–29)
Calcium: 9.7 mg/dL (ref 8.7–10.3)
Chloride: 103 mmol/L (ref 96–106)
Creatinine, Ser: 1.79 mg/dL — ABNORMAL HIGH (ref 0.57–1.00)
GFR calc Af Amer: 28 mL/min/{1.73_m2} — ABNORMAL LOW (ref >59)
GFR calc non Af Amer: 25 mL/min/{1.73_m2} — ABNORMAL LOW (ref >59)
Globulin, Total: 2.9 g/dL (ref 1.5–4.5)
Glucose: 90 mg/dL (ref 65–99)
Potassium: 4.2 mmol/L (ref 3.5–5.2)
Sodium: 142 mmol/L (ref 134–144)
Total Protein: 7.2 g/dL (ref 6.0–8.5)

## 2019-11-08 LAB — HEMOGLOBIN A1C
Est. average glucose Bld gHb Est-mCnc: 146 mg/dL
Hgb A1c MFr Bld: 6.7 % — ABNORMAL HIGH (ref 4.8–5.6)

## 2019-11-09 ENCOUNTER — Ambulatory Visit (INDEPENDENT_AMBULATORY_CARE_PROVIDER_SITE_OTHER): Payer: Medicare PPO

## 2019-11-09 ENCOUNTER — Ambulatory Visit: Payer: Medicare PPO

## 2019-11-09 VITALS — Ht 60.0 in | Wt 139.0 lb

## 2019-11-09 DIAGNOSIS — Z0000 Encounter for general adult medical examination without abnormal findings: Secondary | ICD-10-CM | POA: Diagnosis not present

## 2019-11-09 MED ORDER — IRBESARTAN 150 MG PO TABS: 150.0000 mg | ORAL_TABLET | Freq: Every day | ORAL | 11 refills | Status: DC

## 2019-11-09 NOTE — Patient Instructions (Signed)
[name] , Thank you for taking time to come for your Medicare Wellness Visit. I appreciate your ongoing commitment to your health goals. Please review the following plan we discussed and let me know if I can assist you in the future.   Screening recommendations/referrals: Colonoscopy: not required Mammogram: completed 03/24/2019 Bone Density: completed 03/18/2018 Recommended yearly ophthalmology/optometry visit for glaucoma screening and checkup Recommended yearly dental visit for hygiene and checkup  Vaccinations: Influenza vaccine: completed 11/07/2019 Pneumococcal vaccine: completed 12/28/2018 Tdap vaccine: due Shingles vaccine: discussed   Covid-19: 04/26/2019, 04/01/2019  Advanced directives: Advance directive discussed with you today.    Conditions/risks identified: none  Next appointment: 03/08/2020 at 2:00 Follow up in one year for your annual wellness visit    Preventive Care 65 Years and Older, Female Preventive care refers to lifestyle choices and visits with your health care provider that can promote health and wellness. What does preventive care include?  A yearly physical exam. This is also called an annual well check.  Dental exams once or twice a year.  Routine eye exams. Ask your health care provider how often you should have your eyes checked.  Personal lifestyle choices, including:  Daily care of your teeth and gums.  Regular physical activity.  Eating a healthy diet.  Avoiding tobacco and drug use.  Limiting alcohol use.  Practicing safe sex.  Taking low-dose aspirin every day.  Taking vitamin and mineral supplements as recommended by your health care provider. What happens during an annual well check? The services and screenings done by your health care provider during your annual well check will depend on your age, overall health, lifestyle risk factors, and family history of disease. Counseling  Your health care provider may ask you  questions about your:  Alcohol use.  Tobacco use.  Drug use.  Emotional well-being.  Home and relationship well-being.  Sexual activity.  Eating habits.  History of falls.  Memory and ability to understand (cognition).  Work and work Statistician.  Reproductive health. Screening  You may have the following tests or measurements:  Height, weight, and BMI.  Blood pressure.  Lipid and cholesterol levels. These may be checked every 5 years, or more frequently if you are over 40 years old.  Skin check.  Lung cancer screening. You may have this screening every year starting at age 25 if you have a 30-pack-year history of smoking and currently smoke or have quit within the past 15 years.  Fecal occult blood test (FOBT) of the stool. You may have this test every year starting at age 70.  Flexible sigmoidoscopy or colonoscopy. You may have a sigmoidoscopy every 5 years or a colonoscopy every 10 years starting at age 33.  Hepatitis C blood test.  Hepatitis B blood test.  Sexually transmitted disease (STD) testing.  Diabetes screening. This is done by checking your blood sugar (glucose) after you have not eaten for a while (fasting). You may have this done every 1-3 years.  Bone density scan. This is done to screen for osteoporosis. You may have this done starting at age 43.  Mammogram. This may be done every 1-2 years. Talk to your health care provider about how often you should have regular mammograms. Talk with your health care provider about your test results, treatment options, and if necessary, the need for more tests. Vaccines  Your health care provider may recommend certain vaccines, such as:  Influenza vaccine. This is recommended every year.  Tetanus, diphtheria, and acellular pertussis (Tdap,  Td) vaccine. You may need a Td booster every 10 years.  Zoster vaccine. You may need this after age 41.  Pneumococcal 13-valent conjugate (PCV13) vaccine. One dose is  recommended after age 96.  Pneumococcal polysaccharide (PPSV23) vaccine. One dose is recommended after age 6. Talk to your health care provider about which screenings and vaccines you need and how often you need them. This information is not intended to replace advice given to you by your health care provider. Make sure you discuss any questions you have with your health care provider. Document Released: 03/02/2015 Document Revised: 10/24/2015 Document Reviewed: 12/05/2014 Elsevier Interactive Patient Education  2017 [place] Prevention in the Home Falls can cause injuries. They can happen to people of all ages. There are many things you can do to make your home safe and to help prevent falls. What can I do on the outside of my home?  Regularly fix the edges of walkways and driveways and fix any cracks.  Remove anything that might make you trip as you walk through a door, such as a raised step or threshold.  Trim any bushes or trees on the path to your home.  Use bright outdoor lighting.  Clear any walking paths of anything that might make someone trip, such as rocks or tools.  Regularly check to see if handrails are loose or broken. Make sure that both sides of any steps have handrails.  Any raised decks and porches should have guardrails on the edges.  Have any leaves, snow, or ice cleared regularly.  Use sand or salt on walking paths during winter.  Clean up any spills in your garage right away. This includes oil or grease spills. What can I do in the bathroom?  Use night lights.  Install grab bars by the toilet and in the tub and shower. Do not use towel bars as grab bars.  Use non-skid mats or decals in the tub or shower.  If you need to sit down in the shower, use a plastic, non-slip stool.  Keep the floor dry. Clean up any water that spills on the floor as soon as it happens.  Remove soap buildup in the tub or shower regularly.  Attach bath mats  securely with double-sided non-slip rug tape.  Do not have throw rugs and other things on the floor that can make you trip. What can I do in the bedroom?  Use night lights.  Make sure that you have a light by your bed that is easy to reach.  Do not use any sheets or blankets that are too big for your bed. They should not hang down onto the floor.  Have a firm chair that has side arms. You can use this for support while you get dressed.  Do not have throw rugs and other things on the floor that can make you trip. What can I do in the kitchen?  Clean up any spills right away.  Avoid walking on wet floors.  Keep items that you use a lot in easy-to-reach places.  If you need to reach something above you, use a strong step stool that has a grab bar.  Keep electrical cords out of the way.  Do not use floor polish or wax that makes floors slippery. If you must use wax, use non-skid floor wax.  Do not have throw rugs and other things on the floor that can make you trip. What can I do with my stairs?  Do not  leave any items on the stairs.  Make sure that there are handrails on both sides of the stairs and use them. Fix handrails that are broken or loose. Make sure that handrails are as long as the stairways.  Check any carpeting to make sure that it is firmly attached to the stairs. Fix any carpet that is loose or worn.  Avoid having throw rugs at the top or bottom of the stairs. If you do have throw rugs, attach them to the floor with carpet tape.  Make sure that you have a light switch at the top of the stairs and the bottom of the stairs. If you do not have them, ask someone to add them for you. What else can I do to help prevent falls?  Wear shoes that:  Do not have high heels.  Have rubber bottoms.  Are comfortable and fit you well.  Are closed at the toe. Do not wear sandals.  If you use a stepladder:  Make sure that it is fully opened. Do not climb a closed  stepladder.  Make sure that both sides of the stepladder are locked into place.  Ask someone to hold it for you, if possible.  Clearly mark and make sure that you can see:  Any grab bars or handrails.  First and last steps.  Where the edge of each step is.  Use tools that help you move around (mobility aids) if they are needed. These include:  Canes.  Walkers.  Scooters.  Crutches.  Turn on the lights when you go into a dark area. Replace any light bulbs as soon as they burn out.  Set up your furniture so you have a clear path. Avoid moving your furniture around.  If any of your floors are uneven, fix them.  If there are any pets around you, be aware of where they are.  Review your medicines with your doctor. Some medicines can make you feel dizzy. This can increase your chance of falling. Ask your doctor what other things that you can do to help prevent falls. This information is not intended to replace advice given to you by your health care provider. Make sure you discuss any questions you have with your health care provider. Document Released: 11/30/2008 Document Revised: 07/12/2015 Document Reviewed: 03/10/2014 Elsevier Interactive Patient Education  2017 [employer].

## 2019-11-09 NOTE — Progress Notes (Signed)
I connected with [place] today by telephone and verified that I am speaking with the correct person using two identifiers. Location patient: home Location provider: work Persons participating in the virtual visit: [place], Glenna Durand LPN.   I discussed the limitations, risks, security and privacy concerns of performing an evaluation and management service by telephone and the availability of in person appointments. I also discussed with the patient that there may be a patient responsible charge related to this service. The patient expressed understanding and verbally consented to this telephonic visit.    Interactive audio and video telecommunications were attempted between this provider and patient, however failed, due to patient having technical difficulties OR patient did not have access to video capability.  We continued and completed visit with audio only.     Vital signs may be patient reported or missing.  Subjective:   [name] is a 86 y.o. female who presents for Medicare Annual (Subsequent) preventive examination.  Review of Systems     Cardiac Risk Factors include: advanced age (>72mn, >>84women);diabetes mellitus;hypertension;sedentary lifestyle     Objective:    Today's Vitals   11/09/19 0916  Weight: 139 lb (63 kg)  Height: 5' (1.524 m)   Body mass index is 27.15 kg/m.  Advanced Directives 11/09/2019 09/16/2018  Does Patient Have a Medical Advance Directive? No Yes  Type of Advance Directive - [place]  Copy of [place] Chart? - No - copy requested    Current Medications (verified) Outpatient Encounter Medications as of 11/09/2019  Medication Sig  . Accu-Chek Softclix Lancets lancets Use as instructed to check blood sugars up to 3 times a day  Dx code e11.65  . acetaminophen (TYLENOL) 650 MG CR tablet Take 650 mg by mouth every 8 (eight) hours as needed for pain.  .Marland KitchenamLODipine  (NORVASC) 5 MG tablet TAKE 1 TABLET(5 MG) BY MOUTH DAILY  . Blood Glucose Calibration (ACCU-CHEK AVIVA) SOLN Use with accu-check aviva machine  . Blood Glucose Monitoring Suppl (ACCU-CHEK AVIVA PLUS) w/Device KIT Use to check  Blood sugars up to 3 times a day.  Dx code e11.65  . carvedilol (COREG) 12.5 MG tablet TAKE 1 TABLET(12.5 MG) BY MOUTH TWICE DAILY WITH A MEAL  . chlorthalidone (HYGROTON) 25 MG tablet   . Cholecalciferol (VITAMIN D) 125 MCG (5000 UT) CAPS Take by mouth.  . dipyridamole-aspirin (AGGRENOX) 200-25 MG 12hr capsule TAKE 1 CAPSULE BY MOUTH EVERY DAY  . glimepiride (AMARYL) 1 MG tablet TAKE 1/2 TABLET BY MOUTH ON MONDAY, WEDNESDAY AND FRIDAY  . glucosamine-chondroitin 500-400 MG tablet Take 1 tablet by mouth 3 (three) times daily.    .Marland Kitchenglucose blood test strip Use as instructed to check blood sugars up to 3 times a day.  Dx code e11.65  . irbesartan (AVAPRO) 150 MG tablet   . Multiple Vitamin (MULTIVITAMIN) capsule Take 1 capsule by mouth daily.    . nitroGLYCERIN (NITROSTAT) 0.4 MG SL tablet PLACE 1 TABLET UNDER THE TONGUE AT THE 1ST SIGN OF ATTACK, MAY REPEAT EVERY 5 MINUTES FOR 3 DOSES IN 15 MINUTES, IF PAIN PERSISTS, CALL 911  . rosuvastatin (CRESTOR) 5 MG tablet TAKE 1 TABLET(5 MG) BY MOUTH DAILY   No facility-administered encounter medications on file as of 11/09/2019.    Allergies (verified) Patient has no known allergies.   History: Past Medical History:  Diagnosis Date  . Cerebrovascular disease, unspecified   . Coronary atherosclerosis of unspecified type of vessel,  native or graft   . Diabetes mellitus without complication ([place])   . Other and unspecified hyperlipidemia   . Rheumatoid arthritis(714.0)   . Shingles 2003  . Unspecified essential hypertension    Past Surgical History:  Procedure Laterality Date  . Cardiac Bypass  2003  . CATARACT EXTRACTION, BILATERAL  2007   Family History  Problem Relation Age of Onset  . Cancer Mother   .  Hypertension Father   . Cancer Brother   . Hypertension Paternal Grandmother   . Heart disease Brother   . Diabetes Neg Hx   . Coronary artery disease Neg Hx    Social History   Socioeconomic History  . Marital status: Widowed    Spouse name: Not on file  . Number of children: Not on file  . Years of education: Not on file  . Highest education level: Not on file  Occupational History  . Occupation: retired  Tobacco Use  . Smoking status: Never Smoker  . Smokeless tobacco: Never Used  Vaping Use  . Vaping Use: Never used  Substance and Sexual Activity  . Alcohol use: No  . Drug use: No  . Sexual activity: Not Currently  Other Topics Concern  . Not on file  Social History Narrative   Retired   Tobacco use- no   Alcohol use-no         Social Determinants of Radio broadcast assistant Strain: Low Risk   . Difficulty of Paying Living Expenses: Not hard at all  Food Insecurity: No Food Insecurity  . Worried About Charity fundraiser in the Last Year: Never true  . Ran Out of Food in the Last Year: Never true  Transportation Needs: No Transportation Needs  . Lack of Transportation (Medical): No  . Lack of Transportation (Non-Medical): No  Physical Activity: Insufficiently Active  . Days of Exercise per Week: 2 days  . Minutes of Exercise per Session: 30 min  Stress: No Stress Concern Present  . Feeling of Stress : Not at all  Social Connections:   . Frequency of Communication with Friends and Family: Not on file  . Frequency of Social Gatherings with Friends and Family: Not on file  . Attends Religious Services: Not on file  . Active Member of Clubs or Organizations: Not on file  . Attends Archivist Meetings: Not on file  . Marital Status: Not on file    Tobacco Counseling Counseling given: Not Answered   Clinical Intake:  Pre-visit preparation completed: Yes  Pain : No/denies pain     Nutritional Status: BMI 25 -29 Overweight Nutritional  Risks: None Diabetes: No  How often do you need to have someone help you when you read instructions, pamphlets, or other written materials from your doctor or pharmacy?: 1 - Never What is the last grade level you completed in school?: 2 yrs college  Diabetic? Yes Nutrition Risk Assessment:  Has the patient had any N/V/D within the last 2 months?  No  Does the patient have any non-healing wounds?  No  Has the patient had any unintentional weight loss or weight gain?  No   Diabetes:  Is the patient diabetic?  Yes  If diabetic, was a CBG obtained today?  No  Did the patient bring in their glucometer from home?  No  How often do you monitor your CBG's? Twice daily.   Financial Strains and Diabetes Management:  Are you having any financial strains with the device, your  supplies or your medication? No .  Does the patient want to be seen by Chronic Care Management for management of their diabetes?  No  Would the patient like to be referred to a Nutritionist or for Diabetic Management?  No   Diabetic Exams:  Diabetic Eye Exam: Overdue for diabetic eye exam. Pt has been advised about the importance in completing this exam. Patient advised to call and schedule an eye exam. Diabetic Foot Exam: Completed 11/07/2019   Interpreter Needed?: No  Information entered by :: NAllen LPN   Activities of Daily Living In your present state of health, do you have any difficulty performing the following activities: 11/09/2019  Hearing? N  Vision? N  Difficulty concentrating or making decisions? N  Walking or climbing stairs? Y  Comment uses cane/walker  Dressing or bathing? N  Doing errands, shopping? N  Preparing Food and eating ? N  Using the Toilet? N  In the past six months, have you accidently leaked urine? Y  Comment wears depends  Do you have problems with loss of bowel control? N  Managing your Medications? N  Managing your Finances? N  Housekeeping or managing your Housekeeping? N    Some recent data might be hidden    Patient Care Team: Glendale Chard, MD as PCP - General (Internal Medicine) Josue Hector, MD as PCP - Cardiology (Cardiology) Little, Claudette Stapler, RN as Case Manager  Indicate any recent Medical Services you may have received from other than [facility] providers in the past year (date may be approximate).     Assessment:   This is a routine wellness examination for [place].  Hearing/Vision screen No exam data present  Dietary issues and exercise activities discussed: Current Exercise Habits: Home exercise routine, Type of exercise: walking, Time (Minutes): 30, Frequency (Times/Week): 3, Weekly Exercise (Minutes/Week): 90  Goals    .  "I would like to better manage my diabetes" (pt-stated)      Current Barriers:  [name] Knowledge Deficits related to diabetes disease process and Self Health management   Nurse Case Manager Clinical Goal(s):  [name] Over the next 90 days, patient will work with the CCM team to address needs related to diabetes disease management and lowering her A1C  CCM RN CM Interventions:  11/10/18 call completed with patient   . Evaluation of current treatment plan related to diabetes mellitus and patient's adherence to plan as established by provider. . Provided education to patient re: current A1C of 6.7 obtained on 09/16/18; discussed this is within target range; discussed the nondiabetic target range and discussed ways to reach this goal by adhering to a diabetic friendly diet, taking diabetic medications exactly as prescribed, implementing exercise in her daily routine (discussed ADA recommendations of 150 minutes weekly); discussed patient's average BG runs around 80-112 qd before breakfast; educated patient on s/s of hypo/hyperglycemia and how to respond to readings <80 and or >200  . Reviewed medications with patient and discussed patient is taking Glimepiride 1 mg 1/2 tablet daily w/o noted SE   . Discussed plans with patient for ongoing  care management follow up and provided patient with direct contact information for care management team . Provided patient with diabetic educational materials related to "Manage Your Diabetes", "Diabetes Meal Planning" . Advised patient, providing education and rationale, to check cbg before meals daily and record, calling PCP and or CCM team for findings outside established parameters.  , "signs/symptoms of Hypo/Hyperglycemia"  CCM PharmD Interventions:  01/12/19 call completed with patient  .  T2DM, last A1c was 6.8% on 12/28/18 . Patient remains on glimepiride 1/2 tablet on M, W, F; denies hypoglycemia o Glimepiride not ideal in 89yoF due to risk of hypglycemia o Counseled on hypoglycemia and how to react to Bg<70 o Patient does not wish to change regimen at this time.  Will continue for now . FBG 100s-120s . She is taking an ARB and statin (M-F only)  Patient Self Care Activities:  . Self administers medications as prescribed . Attends all scheduled provider appointments . Calls pharmacy for medication refills . Performs ADL's independently . Performs IADL's independently . Calls provider office for new concerns or questions  Please see past updates related to this goal by clicking on the "Past Updates" button in the selected goal      .  "I would like to continue to stay active despite my arthritic hip pain" (pt-stated)      Current Barriers:  . 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 Limited ROM secondary to having Arthritic pain   Nurse Case Manager Clinical Goal(s):  [name] Over the next 90 days, patient will maintain her ability to perform Self care and stay physically active  CCM RN CM Interventions:  11/10/18 call completed with patient   . Evaluation of current treatment plan related to bilateral hip pain and patient's adherence to plan as established by provider; discussed patient manages this hip pain by staying active within her home by stair climbing and moving around within her  home while attending to her daily activities and house chores . Discussed currently patient is satisfied with her treatment plan for management of her arthritis  . Discussed plans with patient for ongoing care management follow up and provided patient with direct contact information for care management team  Patient Self Care Activities:  . Self administers medications as prescribed . Attends all scheduled provider appointments . Calls pharmacy for medication refills . Performs ADL's independently . Performs IADL's independently . Calls provider office for new concerns or questions  Initial goal documentation     .  "What is a normal BP" (pt-stated)      Current Barriers:  [name] Knowledge Deficits related to disease process and Self Health management for Hypertension  Nurse Case Manager Clinical Goal(s):  [name] Over the next 60 days, patient will verbalize basic understanding of Hypertension  disease process and self health management plan as evidenced by patient will log her BP taken at home and will report consistent BP readings of 130/80 or lower  CCM RN CM Interventions:  11/10/18 call completed with patient   . Evaluation of current treatment plan related to Hypertension and patient's adherence to plan as established by provider. . Provided education to patient re: the importance of keeping BP well controlled to help reduce the risk of potential cardiovascular complications and kidney disease; discussed importance of avoiding tobacco products, implementing exercise in her daily routine, getting enough sleep and maintaining body weight within recommended BMI; educated patient on importance of adhering to a low Sodium diet and eating a well balanced heart healthy diet; discussed patient has a BP cuff in her home and feels comfortable monitoring her BP as needed; patient encouraged to monitor her BP routinely and to record her readings alerting the CCM team and or PCP of abnormal readings;  discussed target BP 130/80  . Reviewed medications with patient and discussed patient's current pharmacological treatment plan; discussed medication indication, dosage and frequency; discussed patient has a good understanding about each of her medications  and is satisfied with her HTN management . Discussed plans with patient for ongoing care management follow up and provided patient with direct contact information for care management team . Provided patient with printed educational materials related to "My BP Log"  Patient Self Care Activities:  . Self administers medications as prescribed . Attends all scheduled provider appointments . Calls pharmacy for medication refills . Performs ADL's independently . Performs IADL's independently . Calls provider office for new concerns or questions  Initial goal documentation     .  Exercise 150 min/wk Moderate Activity      09/16/2018, wants to exercise more    .  My aggrenox is expensive (pt-stated)      Current Barriers:  . Financial Barriers: patient has [facility] insurance and reports copay for Aggrenox (generic) is cost prohibitive at this time  Pharmacist Clinical Goal(s):  [name] Over the next 30 days, patient will work with PharmD and providers to relieve medication access concerns  Interventions: Completed call with patient on 01/12/19 . Comprehensive medication review completed; medication list updated in electronic medical record.  . Call placed to pharmacy.  Patient is in the coverage gap and paying over $100 month for generic Aggrenox . Outreach to PharmD at [facility] to investigate if patient should remain on Aggrenox or is there an alternative agent we can use.  It appears patient has been on this drug since 2015 (stroke).  Patient is tolerating this medication and denies adverse events.   . Encouraged patient to ask about Aggrenox at upcoming cardiology visit on 01/24/19 . Will continue to follow  Patient Self Care Activities:    . Patient will provide necessary portions of application   Please see past updates related to this goal by clicking on the "Past Updates" button in the selected goal      .  Patient Stated      11/09/2019, wants to walk without cane or walker      Depression Screen PHQ 2/9 Scores 11/09/2019 09/22/2018 09/16/2018 03/16/2018 12/10/2017 05/10/2015  PHQ - 2 Score 0 0 0 0 0 0  PHQ- 9 Score - 0 0 - - -    Fall Risk Fall Risk  11/09/2019 12/28/2018 09/16/2018 09/16/2018 03/16/2018  Falls in the past year? 0 0 0 0 0  Risk for fall due to : Medication side effect - - Impaired balance/gait;Impaired mobility;Medication side effect -  Follow up Falls evaluation completed;Education provided;Falls prevention discussed - - Falls evaluation completed;Education provided;Falls prevention discussed -    Any stairs in or around the home? Yes  If so, are there any without handrails? No  Home free of loose throw rugs in walkways, pet beds, electrical cords, etc? Yes  Adequate lighting in your home to reduce risk of falls? Yes   ASSISTIVE DEVICES UTILIZED TO PREVENT FALLS:  Life alert? No  Use of a cane, walker or w/c? Yes  Grab bars in the bathroom? Yes  Shower chair or bench in shower? Yes  Elevated toilet seat or a handicapped toilet? No   TIMED UP AND GO:  Was the test performed? No . .  Cognitive Function:     6CIT Screen 11/09/2019 09/16/2018  What Year? 0 points 0 points  What month? 0 points 0 points  What time? 0 points 0 points  Count back from 20 4 points 4 points  Months in reverse 4 points 0 points  Repeat phrase 0 points 0 points  Total Score 8 4    Immunizations  Immunization History  Administered Date(s) Administered  . Fluad Quad(high Dose 65+) 11/26/2018, 11/07/2019  . Influenza, High Dose Seasonal PF 11/21/2017  . Influenza-Unspecified 11/17/2012, 11/20/2017  . PFIZER SARS-COV-2 Vaccination 04/01/2019, 04/26/2019  . Pneumococcal Polysaccharide-23 12/28/2018  . Zoster  Recombinat (Shingrix) 11/26/2018    TDAP status: Due, Education has been provided regarding the importance of this vaccine. Advised may receive this vaccine at local pharmacy or Health Dept. Aware to provide a copy of the vaccination record if obtained from local pharmacy or Health Dept. Verbalized acceptance and understanding. Flu Vaccine status: Up to date Pneumococcal vaccine status: Up to date Covid-19 vaccine status: Completed vaccines  Qualifies for Shingles Vaccine? Yes   Zostavax completed No   Shingrix Completed?: Yes  Screening Tests Health Maintenance  Topic Date Due  . TETANUS/TDAP  Never done  . OPHTHALMOLOGY EXAM  08/18/2018  . MAMMOGRAM  03/23/2020  . HEMOGLOBIN A1C  05/06/2020  . FOOT EXAM  11/06/2020  . INFLUENZA VACCINE  Completed  . DEXA SCAN  Completed  . COVID-19 Vaccine  Completed  . PNA vac Low Risk Adult  Completed    Health Maintenance  Health Maintenance Due  Topic Date Due  . TETANUS/TDAP  Never done  . OPHTHALMOLOGY EXAM  08/18/2018    Colorectal cancer screening: No longer required.  Mammogram status: Completed 03/24/2019. Repeat every year Bone Density status: Completed 03/18/2018.   Lung Cancer Screening: (Low Dose CT Chest recommended if Age 78-80 years, 30 pack-year currently smoking OR have quit w/in 15years.) does not qualify.   Lung Cancer Screening Referral: no  Additional Screening:  Hepatitis C Screening: does not qualify;   Vision Screening: Recommended annual ophthalmology exams for early detection of glaucoma and other disorders of the eye. Is the patient up to date with their annual eye exam?  No  Who is the provider or what is the name of the office in which the patient attends annual eye exams? [facility] If pt is not established with a provider, would they like to be referred to a provider to establish care? No .   Dental Screening: Recommended annual dental exams for proper oral hygiene  Community Resource Referral /  Chronic Care Management: CRR required this visit?  No   CCM required this visit?  No      Plan:     I have personally reviewed and noted the following in the patient's chart:   . Medical and social history . Use of alcohol, tobacco or illicit drugs  . Current medications and supplements . Functional ability and status . Nutritional status . Physical activity . Advanced directives . List of other physicians . Hospitalizations, surgeries, and ER visits in previous 12 months . Vitals . Screenings to include cognitive, depression, and falls . Referrals and appointments  In addition, I have reviewed and discussed with patient certain preventive protocols, quality metrics, and best practice recommendations. A written personalized care plan for preventive services as well as general preventive health recommendations were provided to patient.     Kellie Simmering, LPN   08/[id number]   Nurse Notes:

## 2019-11-10 ENCOUNTER — Telehealth: Payer: Self-pay

## 2019-11-10 LAB — URINE CULTURE

## 2019-11-10 NOTE — Telephone Encounter (Signed)
Left the patient a message to call back for lab results.    Your hemoglobin has dropped a little bit - this could be due to kidney function. Let me know if you see blood in your stools. Your hba1c is 6.7, this is great. Your kidney function is stable. Your chol is stable.

## 2019-11-10 NOTE — Telephone Encounter (Signed)
The pt was notified of her most recent lab results.

## 2019-11-10 NOTE — Telephone Encounter (Signed)
-----   Message from Glendale Chard, MD sent at 11/10/2019  1:47 PM EDT ----- Your hemoglobin has dropped a little bit - this could be due to kidney function. Let me know if you see blood in your stools. Your hba1c is 6.7, this is great. Your kidney function is stable. Your chol is stable.   Look forward to seeing you next time!

## 2019-11-14 ENCOUNTER — Other Ambulatory Visit: Payer: Self-pay

## 2019-11-14 MED ORDER — CEPHALEXIN 250 MG PO CAPS: ORAL_CAPSULE | ORAL | 0 refills | Status: DC

## 2019-11-15 ENCOUNTER — Telehealth: Payer: Self-pay

## 2019-11-15 ENCOUNTER — Other Ambulatory Visit: Payer: Self-pay

## 2019-11-15 NOTE — Telephone Encounter (Signed)
The pt called and said that she wanted the office to know she picked up her antibiotic for her UTI.

## 2019-11-21 ENCOUNTER — Other Ambulatory Visit: Payer: Self-pay | Admitting: Nephrology

## 2019-11-22 ENCOUNTER — Ambulatory Visit
Admission: RE | Admit: 2019-11-22 | Discharge: 2019-11-22 | Disposition: A | Discharge status: Home / Self Care | Payer: Medicare PPO | Source: Ambulatory Visit | Attending: Nephrology | Admitting: Nephrology

## 2019-11-22 DIAGNOSIS — K5730 Diverticulosis of large intestine without perforation or abscess without bleeding: Secondary | ICD-10-CM | POA: Diagnosis not present

## 2019-11-22 DIAGNOSIS — N281 Cyst of kidney, acquired: Secondary | ICD-10-CM | POA: Diagnosis not present

## 2019-11-22 IMAGING — MR MR ABDOMEN WO/W CM
17 series · 48 of 48 positions shown · IV contrast (12 ML MULTIHANCE)
Comparison: Ultrasound on [DATE]

CLINICAL DATA: Indeterminate left renal lesions on recent
ultrasound. Chronic kidney disease stage 4.

EXAM:
MRI ABDOMEN WITHOUT AND WITH CONTRAST
TECHNIQUE: Multiplanar multisequence MR imaging of the abdomen was performed
both before and after the administration of intravenous contrast.
CONTRAST:  12mL MULTIHANCE GADOBENATE DIMEGLUMINE 529 MG/ML IV SOLN

[Series 4: DWI · axial · 5.0mm · 1.42mm/px · z∈[-27,+183]mm · 4 of 108 slices shown (1 of 2)]
[im 1/108]
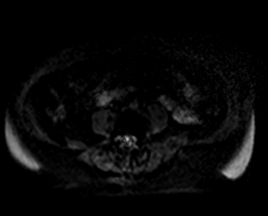
[im 36/108]
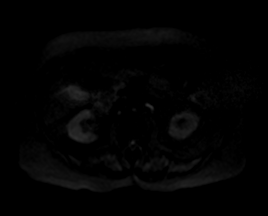
[im 72/108]
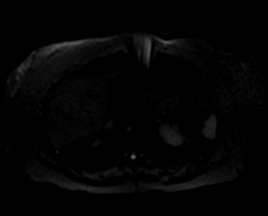
[im 108/108]
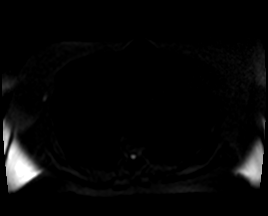

[Series 5: DWI · axial · 5.0mm · 1.42mm/px · z∈[-27,+183]mm · 2 of 36 slices shown (2 of 2)]
[im 1/36]
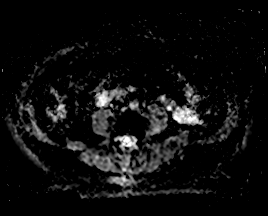
[im 36/36]
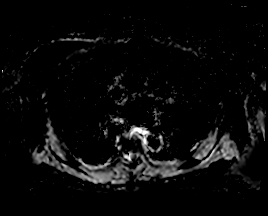

[Series 6: T2 · axial · 5.0mm · 1.48mm/px · z∈[-33,+189]mm · 2 of 38 slices shown (1 of 3)]
[im 1/38]
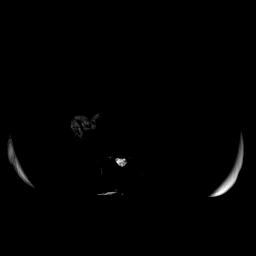
[im 38/38]
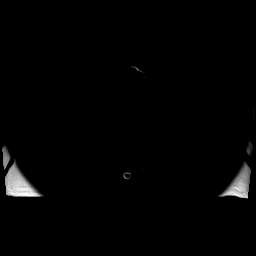

[Series 8: T2 · coronal · 5.0mm · 1.56mm/px · 1 of 32 slices shown (2 of 3)]
[im 1/32]
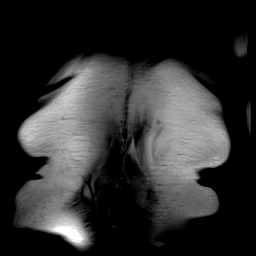

[Series 10: T2 · axial · 6.0mm · 1.22mm/px · 1 of 30 slices shown (3 of 3)]
[im 1/30]
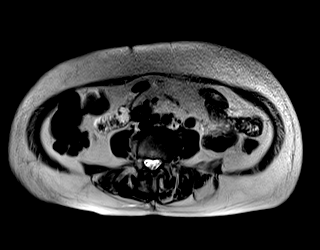

[Series 11: T1 · axial · 3.0mm · 1.19mm/px · z∈[-71,+142]mm · 6 of 144 slices shown]
[im 1/144]
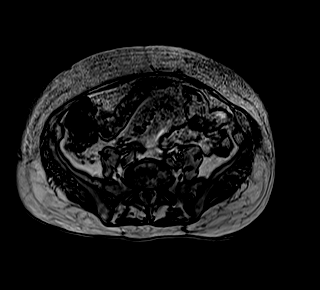
[im 29/144]
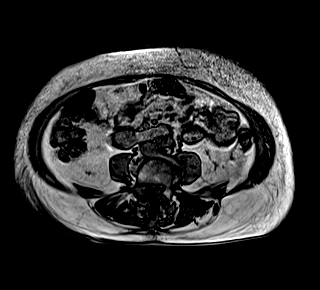
[im 58/144]
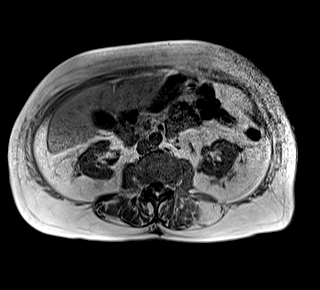
[im 86/144]
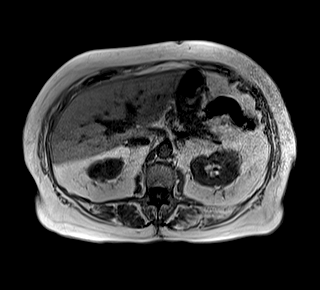
[im 115/144]
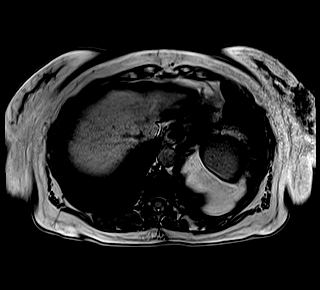
[im 144/144]
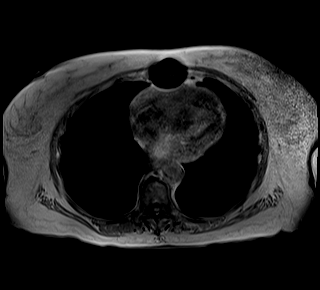

[Series 12: bSSFP · axial · 5.0mm · 1.25mm/px · z∈[-76,+146]mm · 2 of 38 slices shown]
[im 1/38]
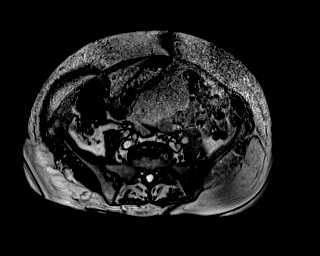
[im 38/38]
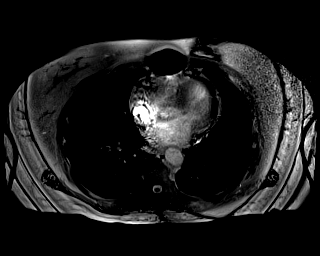

[Series 13: T1 dynamic · axial · non-contrast · 3.0mm · 1.25mm/px · z∈[-71,+142]mm · 3 of 72 slices shown]
[im 1/72]
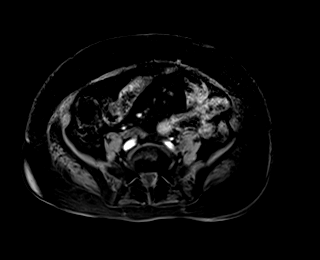
[im 36/72]
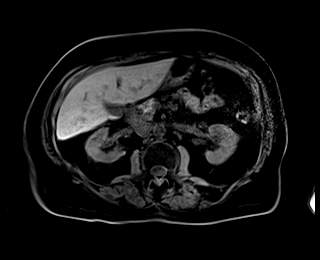
[im 72/72]
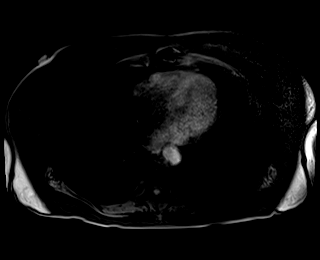

[Series 14: T1 dynamic post-contrast · axial · 3.0mm · 1.25mm/px · z∈[-71,+142]mm · 3 of 72 slices shown (1 of 9)]
[im 1/72]
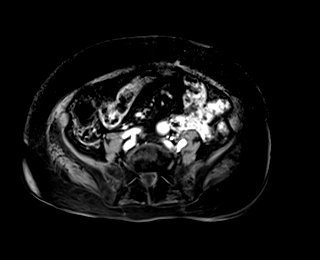
[im 36/72]
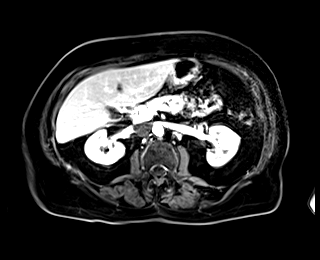
[im 72/72]
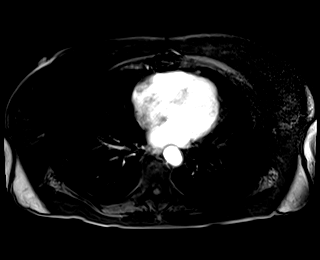

[Series 15: T1 dynamic post-contrast · axial · 3.0mm · 1.25mm/px · z∈[-71,+142]mm · 3 of 72 slices shown (2 of 9)]
[im 1/72]
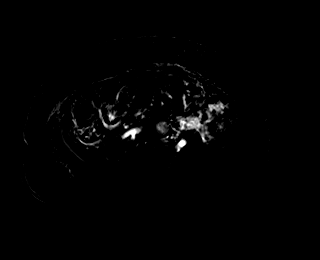
[im 36/72]
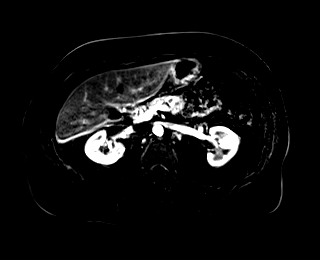
[im 72/72]
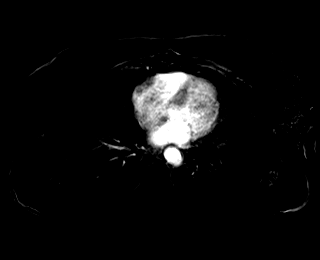

[Series 16: T1 dynamic post-contrast · axial · 3.0mm · 1.25mm/px · z∈[-71,+142]mm · 3 of 72 slices shown (3 of 9)]
[im 1/72]
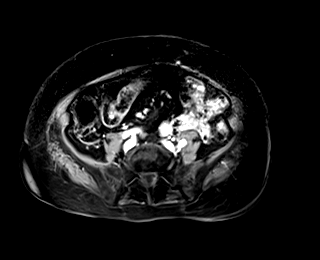
[im 36/72]
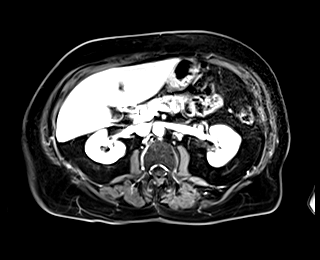
[im 72/72]
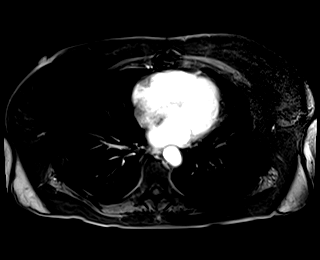

[Series 17: T1 dynamic post-contrast · axial · 3.0mm · 1.25mm/px · z∈[-71,+142]mm · 3 of 72 slices shown (4 of 9)]
[im 1/72]
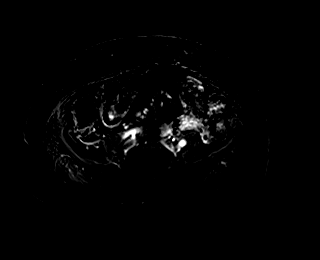
[im 36/72]
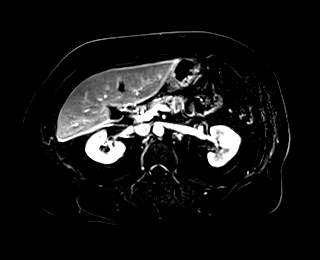
[im 72/72]
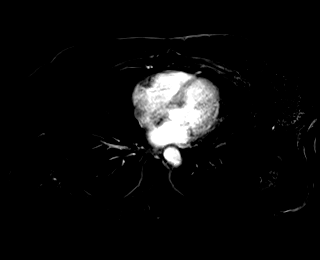

[Series 18: T1 dynamic post-contrast · axial · 3.0mm · 1.25mm/px · z∈[-71,+142]mm · 3 of 72 slices shown (5 of 9)]
[im 1/72]
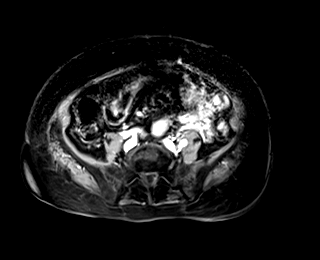
[im 36/72]
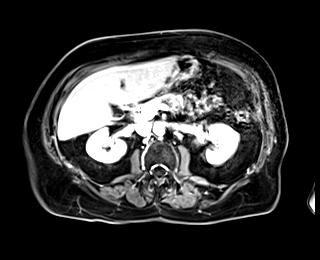
[im 72/72]
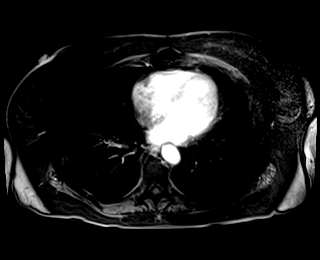

[Series 19: T1 dynamic post-contrast · axial · 3.0mm · 1.25mm/px · z∈[-71,+142]mm · 3 of 72 slices shown (6 of 9)]
[im 1/72]
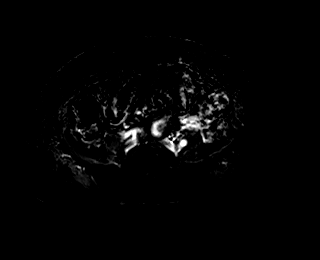
[im 36/72]
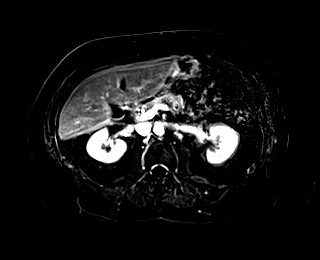
[im 72/72]
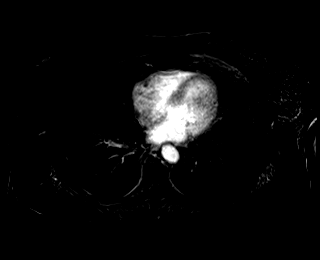

[Series 20: T1 dynamic post-contrast · coronal · 3.0mm · 1.25mm/px · 3 of 72 slices shown (7 of 9)]
[im 1/72]
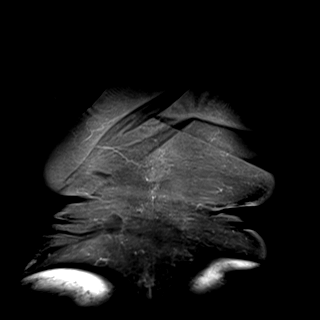
[im 36/72]
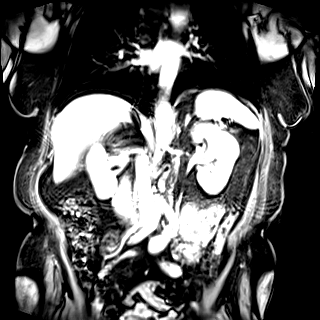
[im 72/72]
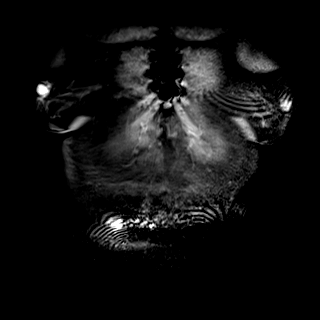

[Series 21: T1 dynamic post-contrast · axial · 3.0mm · 1.25mm/px · z∈[-71,+142]mm · 3 of 72 slices shown (8 of 9)]
[im 1/72]
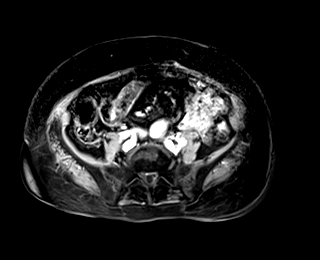
[im 36/72]
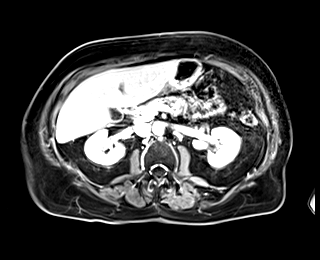
[im 72/72]
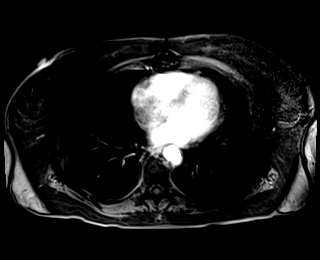

[Series 22: T1 dynamic post-contrast · axial · 3.0mm · 1.25mm/px · z∈[-71,+142]mm · 3 of 72 slices shown (9 of 9)]
[im 1/72]
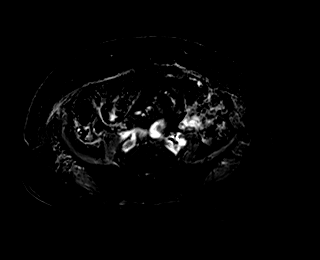
[im 36/72]
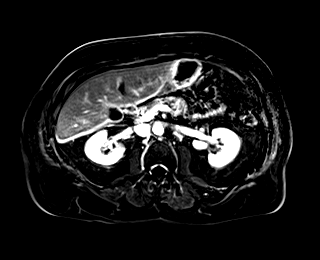
[im 72/72]
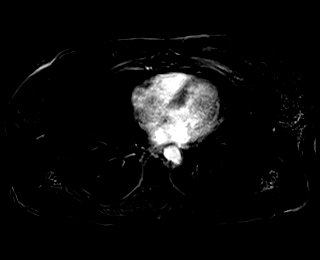

[48 of 48 positions shown; findings below may reference images not displayed]

FINDINGS: Lower chest: No acute findings.

Hepatobiliary: No hepatic masses identified. Layering sludge or tiny
gallstones are noted, however there is no evidence of cholecystitis
or biliary ductal dilatation.

Pancreas: A 1.0 cm simple appearing cystic lesion is seen along the
superior margin of the pancreatic body on image [DATE]. No other
pancreatic lesions identified. No evidence of pancreatic ductal
dilatation.

Spleen:  Within normal limits in size and appearance.

Adrenals/Urinary Tract: Normal adrenal glands. A 7 mm benign Bosniak
category 2 hemorrhagic cyst is seen in the lateral midpole of the
left kidney, and a 1.5 cm benign Bosniak category 2 simple cyst is
seen in the posterior midpole of the left kidney. These correspond
to the lesion seen on recent ultrasound. No evidence renal masses or
hydronephrosis.

Stomach/Bowel: Diverticulosis is seen involving visualized portions
of the ascending and transverse colon, however there is no evidence
of diverticulitis in these regions.

Vascular/Lymphatic: No pathologically enlarged lymph nodes
identified. No abdominal aortic aneurysm.

Other:  None.

Musculoskeletal:  No suspicious bone lesions identified.
IMPRESSION: Benign Bosniak category 1 and 2 left renal cysts, which correspond
to the lesions seen on recent ultrasound. No evidence of renal
neoplasm or hydronephrosis.

1.0 cm simple cystic lesion in the pancreatic body, suspicious for
an indolent cystic neoplasm such as a side-branch IPMN. Recommend
continued follow-up by MRI in 2 years. This recommendation follows
ACR consensus guidelines: Management of Incidental Pancreatic Cysts:
A White Paper of the ACR Incidental Findings Committee. [HOSPITAL] [R9];[DATE].

Colonic diverticulosis.  No radiographic evidence of diverticulitis.

Gallbladder sludge versus tiny gallstones. No radiographic signs of
cholecystitis or biliary dilatation.

## 2019-11-22 MED ORDER — GADOBENATE DIMEGLUMINE 529 MG/ML IV SOLN
12.0000 mL | Freq: Once | INTRAVENOUS | Status: AC | PRN
  Administered 2019-11-22: 12 mL via INTRAVENOUS

## 2019-12-03 DIAGNOSIS — E1165 Type 2 diabetes mellitus with hyperglycemia: Secondary | ICD-10-CM | POA: Diagnosis not present

## 2019-12-04 ENCOUNTER — Encounter: Payer: Self-pay | Admitting: Internal Medicine

## 2019-12-06 ENCOUNTER — Other Ambulatory Visit: Payer: Self-pay

## 2019-12-06 ENCOUNTER — Encounter: Payer: Self-pay | Admitting: Internal Medicine

## 2019-12-06 ENCOUNTER — Ambulatory Visit (INDEPENDENT_AMBULATORY_CARE_PROVIDER_SITE_OTHER): Payer: Medicare PPO | Admitting: Internal Medicine

## 2019-12-06 VITALS — BP 122/74 | HR 66 | Temp 98.5°F | Ht 60.0 in | Wt 138.2 lb

## 2019-12-06 DIAGNOSIS — N281 Cyst of kidney, acquired: Secondary | ICD-10-CM | POA: Diagnosis not present

## 2019-12-06 DIAGNOSIS — K862 Cyst of pancreas: Secondary | ICD-10-CM

## 2019-12-06 NOTE — Progress Notes (Signed)
I,Katawbba Wiggins,acting as a Education administrator for [name], MD.,have documented all relevant documentation on the behalf of [name], MD,as directed by  [name], MD while in the presence of [name], MD.  This visit occurred during the SARS-CoV-2 public health emergency.  Safety protocols were in place, including screening questions prior to the visit, additional usage of staff PPE, and extensive cleaning of exam room while observing appropriate contact time as indicated for disinfecting solutions.  Subjective:     Patient ID: Oliver , female    DOB: [birth year] , 86 y.o.   MRN: [id number]   Chief Complaint  Patient presents with  . MRI results    HPI  The patient is here today to discuss MRI results. MRI abdomen was initially ordered by Dr. Candiss Norse, her nephrologist. This was ordered to further evaluate renal lesions seen on renal ultrasound.     Past Medical History:  Diagnosis Date  . Cerebrovascular disease, unspecified   . Coronary atherosclerosis of unspecified type of vessel, native or graft   . Diabetes mellitus without complication ([place])   . Other and unspecified hyperlipidemia   . Rheumatoid arthritis(714.0)   . Shingles 2003  . Unspecified essential hypertension      Family History  Problem Relation Age of Onset  . Cancer Mother   . Hypertension Father   . Cancer Brother   . Hypertension Paternal Grandmother   . Heart disease Brother   . Diabetes Neg Hx   . Coronary artery disease Neg Hx      Current Outpatient Medications:  .  Accu-Chek Softclix Lancets lancets, Use as instructed to check blood sugars up to 3 times a day  Dx code e11.65, Disp: 100 each, Rfl: 12 .  acetaminophen (TYLENOL) 650 MG CR tablet, Take 650 mg by mouth every 8 (eight) hours as needed for pain., Disp: , Rfl:  .  amLODipine (NORVASC) 5 MG tablet, TAKE 1 TABLET(5 MG) BY MOUTH DAILY, Disp: 90 tablet, Rfl: 2 .  Blood Glucose Calibration (ACCU-CHEK AVIVA) SOLN, Use  with accu-check aviva machine, Disp: 1 each, Rfl: 3 .  Blood Glucose Monitoring Suppl (ACCU-CHEK AVIVA PLUS) w/Device KIT, Use to check  Blood sugars up to 3 times a day.  Dx code e11.65, Disp: 1 kit, Rfl: 3 .  carvedilol (COREG) 12.5 MG tablet, TAKE 1 TABLET(12.5 MG) BY MOUTH TWICE DAILY WITH A MEAL, Disp: 180 tablet, Rfl: 1 .  chlorthalidone (HYGROTON) 25 MG tablet, , Disp: , Rfl:  .  Cholecalciferol (VITAMIN D) 125 MCG (5000 UT) CAPS, Take by mouth., Disp: , Rfl:  .  dipyridamole-aspirin (AGGRENOX) 200-25 MG 12hr capsule, TAKE 1 CAPSULE BY MOUTH EVERY DAY, Disp: 180 capsule, Rfl: 0 .  glimepiride (AMARYL) 1 MG tablet, TAKE 1/2 TABLET BY MOUTH ON MONDAY, WEDNESDAY AND FRIDAY, Disp: 36 tablet, Rfl: 1 .  glucosamine-chondroitin 500-400 MG tablet, Take 1 tablet by mouth 3 (three) times daily.  , Disp: , Rfl:  .  glucose blood test strip, Use as instructed to check blood sugars up to 3 times a day.  Dx code e11.65, Disp: 100 each, Rfl: 12 .  irbesartan (AVAPRO) 150 MG tablet, Take 1 tablet (150 mg total) by mouth daily., Disp: 30 tablet, Rfl: 11 .  Multiple Vitamin (MULTIVITAMIN) capsule, Take 1 capsule by mouth daily.  , Disp: , Rfl:  .  nitroGLYCERIN (NITROSTAT) 0.4 MG SL tablet, PLACE 1 TABLET UNDER THE TONGUE AT THE 1ST SIGN OF ATTACK, MAY  REPEAT EVERY 5 MINUTES FOR 3 DOSES IN 15 MINUTES, IF PAIN PERSISTS, CALL 911, Disp: 50 tablet, Rfl: 0 .  rosuvastatin (CRESTOR) 5 MG tablet, TAKE 1 TABLET(5 MG) BY MOUTH DAILY, Disp: 90 tablet, Rfl: 1   No Known Allergies   Review of Systems  Constitutional: Negative.   Respiratory: Negative.   Cardiovascular: Negative.   Gastrointestinal: Negative.   Psychiatric/Behavioral: Negative.   All other systems reviewed and are negative.    Today's Vitals   12/06/19 1353  BP: 122/74  Pulse: 66  Temp: 98.5 F (36.9 C)  TempSrc: Oral  Weight: 138 lb 3.2 oz (62.7 kg)  Height: 5' (1.524 m)  PainSc: 4   PainLoc: Hip   Body mass index is 26.99 kg/m.    Objective:  Physical Exam Vitals and nursing note reviewed.  Constitutional:      Appearance: Normal appearance.  HENT:     Head: Normocephalic and atraumatic.  Cardiovascular:     Rate and Rhythm: Normal rate and regular rhythm.     Heart sounds: Normal heart sounds.  Pulmonary:     Breath sounds: Normal breath sounds.  Skin:    General: Skin is warm.  Neurological:     General: No focal deficit present.     Mental Status: She is alert and oriented to person, place, and time.     MRI results:   Assessment And Plan:     1. Pancreas cyst Comments: MRI results significant for benign renal cysts and a 1cm simple cystic lesion in the pancreatic body, suspicious for pancreatic neoplasm, such as side branch IPMN. Rec f/u in 2 years. Pt verbally acknowledges information that was given to her. She agrees to repeat testing in 2 years.  All questions were answered to her satisfaction.   2. Renal cyst   Please see above.   Patient was given opportunity to ask questions. Patient verbalized understanding of the plan and was able to repeat key elements of the plan. All questions were answered to their satisfaction.  [name], MD   I, [name], MD, have reviewed all documentation for this visit. The documentation on 12/12/19 for the exam, diagnosis, procedures, and orders are all accurate and complete.  THE PATIENT IS ENCOURAGED TO PRACTICE SOCIAL DISTANCING DUE TO THE COVID-19 PANDEMIC.

## 2019-12-06 NOTE — Patient Instructions (Signed)
Abdominal or Pelvic Ultrasound An ultrasound is a test that uses sound waves to take pictures of the inside of the body. An abdominal ultrasound takes pictures of the inside of your belly (abdomen). A pelvic ultrasound takes pictures of the inside of the area between your hip bones (pelvis). An ultrasound may be done to check an organ or look for problems. This is a safe test that does not hurt. It is done by placing a handheld device called a transducer on the outside of your belly or pelvis and moving it around. Tell your doctor about:  Any allergies you have.  All medicines you are taking.  Any surgeries you have had.  Any medical conditions you have.  Whether you are pregnant or may be pregnant. What are the risks? There are no known risks from having this test. What happens before the procedure?  Follow instructions from your doctor about eating or drinking before the test.  Wear clothing that is easy to wash. Gel from the test might get on your clothes. What happens during the procedure?   You will lie on an exam table.  Your clothes will be moved so your belly and pelvis are showing.  A gel will be put on your skin. It may feel cool.  The transducer device will be put on your skin. It will be moved back and forth over the area being looked at.  The device will take pictures. They will show on small TV screens.  You may be asked to change your position.  After the exam, the gel will be cleaned off. What happens after the procedure?  It is up to you to get the results of your test. Ask your doctor, or the department that is doing the test, when your results will be ready.  Keep all follow-up visits as told by your doctor. This is important. Summary  An ultrasound is a test that uses sound waves to take pictures of the inside of the body.  An ultrasound may be done to check an organ or look for problems.  The test is done by moving a handheld device around on the  outside of your belly or pelvis.  It is up to you to get the results of your test. Be sure to ask when your results will be ready. This information is not intended to replace advice given to you by your health care provider. Make sure you discuss any questions you have with your health care provider. Document Revised: 08/31/2017 Document Reviewed: 08/31/2017 Elsevier Patient Education  2020 [employer] Inc[employer]  

## 2019-12-13 ENCOUNTER — Telehealth: Payer: Medicare PPO

## 2019-12-13 ENCOUNTER — Telehealth: Payer: Self-pay

## 2019-12-13 NOTE — Telephone Encounter (Cosign Needed)
  Chronic Care Management   Outreach Note  12/13/2019 Name: [name] MRN: [id number] DOB: 1927-11-24  Referred by: Glendale Chard, MD Reason for referral : Chronic Care Management (CCM RNCM FU Call )   An unsuccessful telephone outreach was attempted today. The patient was referred to the case management team for assistance with care management and care coordination.   Follow Up Plan: Telephone follow up appointment with care management team member scheduled for: 01/24/20  Barb Merino, RN, BSN, CCM Care Management Coordinator [facility]  Direct Phone: [phone]

## 2020-01-17 DIAGNOSIS — N184 Chronic kidney disease, stage 4 (severe): Secondary | ICD-10-CM | POA: Diagnosis not present

## 2020-01-17 DIAGNOSIS — N182 Chronic kidney disease, stage 2 (mild): Secondary | ICD-10-CM | POA: Diagnosis not present

## 2020-01-17 DIAGNOSIS — I129 Hypertensive chronic kidney disease with stage 1 through stage 4 chronic kidney disease, or unspecified chronic kidney disease: Secondary | ICD-10-CM | POA: Diagnosis not present

## 2020-01-17 DIAGNOSIS — R778 Other specified abnormalities of plasma proteins: Secondary | ICD-10-CM | POA: Diagnosis not present

## 2020-01-17 DIAGNOSIS — N183 Chronic kidney disease, stage 3 unspecified: Secondary | ICD-10-CM | POA: Diagnosis not present

## 2020-01-17 DIAGNOSIS — D631 Anemia in chronic kidney disease: Secondary | ICD-10-CM | POA: Diagnosis not present

## 2020-01-17 DIAGNOSIS — N281 Cyst of kidney, acquired: Secondary | ICD-10-CM | POA: Diagnosis not present

## 2020-01-17 DIAGNOSIS — N2581 Secondary hyperparathyroidism of renal origin: Secondary | ICD-10-CM | POA: Diagnosis not present

## 2020-01-22 ENCOUNTER — Other Ambulatory Visit: Payer: Self-pay | Admitting: Internal Medicine

## 2020-01-24 ENCOUNTER — Telehealth: Payer: Medicare PPO

## 2020-01-24 NOTE — Progress Notes (Signed)
Patient ID: [name], female   DOB: 1927-05-05, 86 y.o.   MRN: [id number]     Cardiology Office Note   Date:  01/26/2020   ID:  [name], DOB [birth year], MRN [id number]  PCP:  Glendale Chard, MD  Cardiologist:   Jenkins Rouge, MD   No chief complaint on file.     History of Present Illness: [name] is a 86 y.o. female who presents for f/u  of CAD. Distant history of CABG complicated by CVA.  Last cath 06/2010 with patent grafts diffuse distal LAD disease   SVG OM/D1 sequential SVG PDA LIMA to LAD  EF normal at that time    CRF;s HTN and elevated lipids on Rx  Has been on Aggrenox since stroke 09/08/13 Carotid 12/08/17  plaque no stenosis  Living independently  Ambulates with cain.  DM under good control   No longer driving . Living independently has one son living in [place] and one in [place]   No angina active walking mild edema in RLE    Mostly limited by arthritis on right side of her body Seen by primary and had MRI turned out to be renal/pancreatic cysts  Baseline Cr 1.87   Worried about PAD but has good pedal pulses. Some arthritic right groin / hip pain  Has had vaccine Son lives next door and looks in on her  She has a little terrier mix that's good company for her    Past Medical History:  Diagnosis Date  . Cerebrovascular disease, unspecified   . Coronary atherosclerosis of unspecified type of vessel, native or graft   . Diabetes mellitus without complication ([place])   . Other and unspecified hyperlipidemia   . Rheumatoid arthritis(714.0)   . Shingles 2003  . Unspecified essential hypertension     Past Surgical History:  Procedure Laterality Date  . Cardiac Bypass  2003  . CATARACT EXTRACTION, BILATERAL  2007     Current Outpatient Medications  Medication Sig Dispense Refill  . Accu-Chek Softclix Lancets lancets Use as instructed to check blood sugars up to 3 times a day  Dx code e11.65 100 each 12  . acetaminophen (TYLENOL) 650  MG CR tablet Take 650 mg by mouth every 8 (eight) hours as needed for pain.    [name] amLODipine (NORVASC) 5 MG tablet TAKE 1 TABLET(5 MG) BY MOUTH DAILY 90 tablet 2  . Blood Glucose Calibration (ACCU-CHEK AVIVA) SOLN Use with accu-check aviva machine 1 each 3  . Blood Glucose Monitoring Suppl (ACCU-CHEK AVIVA PLUS) w/Device KIT Use to check  Blood sugars up to 3 times a day.  Dx code e11.65 1 kit 3  . carvedilol (COREG) 12.5 MG tablet TAKE 1 TABLET(12.5 MG) BY MOUTH TWICE DAILY WITH A MEAL 180 tablet 1  . chlorthalidone (HYGROTON) 25 MG tablet Take 12.5 mg by mouth daily.    . Cholecalciferol (VITAMIN D) 125 MCG (5000 UT) CAPS Take by mouth.    . dipyridamole-aspirin (AGGRENOX) 200-25 MG 12hr capsule TAKE 1 CAPSULE BY MOUTH EVERY DAY 180 capsule 0  . glimepiride (AMARYL) 1 MG tablet TAKE 1/2 TABLET BY MOUTH ON MONDAY, WEDNESDAY AND FRIDAY 36 tablet 1  . glucosamine-chondroitin 500-400 MG tablet Take 1 tablet by mouth 3 (three) times daily.    [name] glucose blood test strip Use as instructed to check blood sugars up to 3 times a day.  Dx code e11.65 100 each 12  . irbesartan (AVAPRO) 150 MG tablet Take 1 tablet (150 mg total) by  mouth daily. 30 tablet 11  . Multiple Vitamin (MULTIVITAMIN) capsule Take 1 capsule by mouth daily.    . nitroGLYCERIN (NITROSTAT) 0.4 MG SL tablet PLACE 1 TABLET UNDER THE TONGUE AT THE 1ST SIGN OF ATTACK, MAY REPEAT EVERY 5 MINUTES FOR 3 DOSES IN 15 MINUTES, IF PAIN PERSISTS, CALL 911 50 tablet 0  . rosuvastatin (CRESTOR) 5 MG tablet TAKE 1 TABLET(5 MG) BY MOUTH DAILY 90 tablet 1   No current facility-administered medications for this visit.    Allergies:   Patient has no known allergies.    Social History:  The patient  reports that she has never smoked. She has never used smokeless tobacco. She reports that she does not drink alcohol and does not use drugs.   Family History:  The patient's family history includes Cancer in her brother and mother; Heart disease in her  brother; Hypertension in her father and paternal grandmother.    ROS:  Please see the history of present illness.   Otherwise, review of systems are positive for none.   All other systems are reviewed and negative.    PHYSICAL EXAM: VS:  BP 134/62   Pulse 60   Ht 5' (1.524 m)   Wt 63.1 kg   SpO2 100%   BMI 27.19 kg/m  , BMI Body mass index is 27.19 kg/m.  Affect appropriate Healthy:  appears stated age 58: normal Neck supple with no adenopathy JVP normal no bruits no thyromegaly Lungs clear with no wheezing and good diaphragmatic motion Heart:  S1/S2 no murmur, no rub, gallop or click PMI normal Abdomen: benighn, BS positve, no tenderness, no AAA no bruit.  No HSM or HJR Distal pulses intact with no bruits No edema Neuro non-focal Skin warm and dry No muscular weakness Plus one RLE edema     EKG:   10/25/13  SR rate 68 normal ECG  11/02/14  SR rate 65 nonspecific ST changes essentially normal  12/17/16 SR rate 66 nonspecific ST changes  01/25/18 SR rate 66 normal   Recent Labs: 11/07/2019: ALT 9; BUN 41; Creatinine, Ser 1.79; Hemoglobin 10.7; Platelets 191; Potassium 4.2; Sodium 142    Lipid Panel    Component Value Date/Time   CHOL 141 11/07/2019 1558   TRIG 136 11/07/2019 1558   HDL 32 (L) 11/07/2019 1558   CHOLHDL 4.4 11/07/2019 1558   LDLCALC 85 11/07/2019 1558      Wt Readings from Last 3 Encounters:  01/26/20 63.1 kg  12/06/19 62.7 kg  11/09/19 63 kg      Other studies Reviewed: Additional studies/ records that were reviewed today include: Epic notes and old CABG report . Labs 11/07/19    ASSESSMENT AND PLAN:  1. CAD/CABG: CABG 2003 with patent grafts on cath 2012 no angina continue medical Rx 2. Carotid:  Duplex 12/08/17  plaque no stenosis observe given age  81. HTN:.  Well controlled.  Continue current medications and low sodium Dash type diet.   4. DM:  Discussed low carb diet.  Target hemoglobin A1c is 6.5 or less.  Continue current  medications. 5. Chol: continue crestor LDL 11/07/19  6. Edema:  RLE from SVG harvest chronic Has diuretic continue hygroton 7. CRF:  Cr around 1.9 do not escalate diuretic f/u nephrology   Current medicines are reviewed at length with the patient today.  The patient does not have concerns regarding medicines.  The following changes have been made:  no change  Labs/ tests ordered today include:  None     No orders of the defined types were placed in this encounter.    Disposition:   FU with me in a year      Signed, Jenkins Rouge, MD  01/26/2020 9:21 AM    [address], [place], [place]  [address] Phone: [phone]; Fax: [phone]

## 2020-01-25 ENCOUNTER — Other Ambulatory Visit: Payer: Self-pay | Admitting: Internal Medicine

## 2020-01-25 DIAGNOSIS — H04123 Dry eye syndrome of bilateral lacrimal glands: Secondary | ICD-10-CM | POA: Diagnosis not present

## 2020-01-25 DIAGNOSIS — Z961 Presence of intraocular lens: Secondary | ICD-10-CM | POA: Diagnosis not present

## 2020-01-25 DIAGNOSIS — H5203 Hypermetropia, bilateral: Secondary | ICD-10-CM | POA: Diagnosis not present

## 2020-01-25 DIAGNOSIS — H524 Presbyopia: Secondary | ICD-10-CM | POA: Diagnosis not present

## 2020-01-25 LAB — HM DIABETES EYE EXAM

## 2020-01-26 ENCOUNTER — Other Ambulatory Visit: Payer: Self-pay

## 2020-01-26 ENCOUNTER — Encounter: Payer: Self-pay | Admitting: Cardiovascular Disease

## 2020-01-26 ENCOUNTER — Ambulatory Visit: Payer: Medicare PPO | Admitting: Cardiovascular Disease

## 2020-01-26 VITALS — BP 134/62 | HR 60 | Ht 60.0 in | Wt 139.2 lb

## 2020-01-26 DIAGNOSIS — Z951 Presence of aortocoronary bypass graft: Secondary | ICD-10-CM

## 2020-01-26 NOTE — Patient Instructions (Signed)

## 2020-02-21 DIAGNOSIS — E1165 Type 2 diabetes mellitus with hyperglycemia: Secondary | ICD-10-CM | POA: Diagnosis not present

## 2020-02-22 ENCOUNTER — Other Ambulatory Visit: Payer: Self-pay | Admitting: Internal Medicine

## 2020-02-22 DIAGNOSIS — I2510 Atherosclerotic heart disease of native coronary artery without angina pectoris: Secondary | ICD-10-CM

## 2020-03-08 ENCOUNTER — Ambulatory Visit: Payer: Medicare PPO | Admitting: Internal Medicine

## 2020-03-08 ENCOUNTER — Other Ambulatory Visit: Payer: Self-pay

## 2020-03-08 ENCOUNTER — Ambulatory Visit (INDEPENDENT_AMBULATORY_CARE_PROVIDER_SITE_OTHER): Payer: Medicare PPO

## 2020-03-08 ENCOUNTER — Encounter: Payer: Self-pay | Admitting: Internal Medicine

## 2020-03-08 VITALS — BP 150/60 | HR 69 | Temp 98.2°F | Ht 60.0 in | Wt 142.6 lb

## 2020-03-08 VITALS — BP 150/60 | HR 69 | Temp 98.2°F | Ht 60.0 in | Wt 142.0 lb

## 2020-03-08 DIAGNOSIS — N183 Chronic kidney disease, stage 3 unspecified: Secondary | ICD-10-CM

## 2020-03-08 DIAGNOSIS — E1122 Type 2 diabetes mellitus with diabetic chronic kidney disease: Secondary | ICD-10-CM | POA: Diagnosis not present

## 2020-03-08 DIAGNOSIS — I1310 Hypertensive heart and chronic kidney disease without heart failure, with stage 1 through stage 4 chronic kidney disease, or unspecified chronic kidney disease: Secondary | ICD-10-CM | POA: Diagnosis not present

## 2020-03-08 DIAGNOSIS — Z23 Encounter for immunization: Secondary | ICD-10-CM | POA: Diagnosis not present

## 2020-03-08 DIAGNOSIS — Z0000 Encounter for general adult medical examination without abnormal findings: Secondary | ICD-10-CM | POA: Diagnosis not present

## 2020-03-08 DIAGNOSIS — I10 Essential (primary) hypertension: Secondary | ICD-10-CM

## 2020-03-08 DIAGNOSIS — I2510 Atherosclerotic heart disease of native coronary artery without angina pectoris: Secondary | ICD-10-CM

## 2020-03-08 MED ORDER — BOOSTRIX 5-2.5-18.5 LF-MCG/0.5 IM SUSP: 0.5000 mL | Freq: Once | INTRAMUSCULAR | 0 refills | Status: AC

## 2020-03-08 MED ORDER — NITROGLYCERIN 0.4 MG SL SUBL
SUBLINGUAL_TABLET | SUBLINGUAL | 0 refills | Status: DC
Start: 2020-03-08 — End: 2020-08-24

## 2020-03-08 MED ORDER — CARVEDILOL 12.5 MG PO TABS: ORAL_TABLET | ORAL | 1 refills | Status: DC

## 2020-03-08 MED ORDER — GLIMEPIRIDE 1 MG PO TABS: ORAL_TABLET | ORAL | 1 refills | Status: DC

## 2020-03-08 MED ORDER — ROSUVASTATIN CALCIUM 5 MG PO TABS: ORAL_TABLET | ORAL | 1 refills | Status: DC

## 2020-03-08 MED ORDER — ASPIRIN-DIPYRIDAMOLE ER 25-200 MG PO CP12: 1.0000 | ORAL_CAPSULE | Freq: Every day | ORAL | 1 refills | Status: DC

## 2020-03-08 NOTE — Patient Instructions (Signed)
Ms. [name] , Thank you for taking time to come for your Medicare Wellness Visit. I appreciate your ongoing commitment to your health goals. Please review the following plan we discussed and let me know if I can assist you in the future.   Screening recommendations/referrals: Colonoscopy: not required Mammogram: completed 03/24/2019, due 03/23/2020 Bone Density: completed 03/18/2018 Recommended yearly ophthalmology/optometry visit for glaucoma screening and checkup Recommended yearly dental visit for hygiene and checkup  Vaccinations: Influenza vaccine: completed 11/07/2019, due 09/17/2020 Pneumococcal vaccine: completed 12/28/2018 Tdap vaccine: sent to pharmacy Shingles vaccine: completed   Covid-19:12/08/2019, 04/26/2019, 04/01/2019  Advanced directives: Please bring a copy of your POA (Power of Attorney) and/or Living Will to your next appointment.   Conditions/risks identified: none  Next appointment: Follow up in one year for your annual wellness visit    Preventive Care 65 Years and Older, Female Preventive care refers to lifestyle choices and visits with your health care provider that can promote health and wellness. What does preventive care include?  A yearly physical exam. This is also called an annual well check.  Dental exams once or twice a year.  Routine eye exams. Ask your health care provider how often you should have your eyes checked.  Personal lifestyle choices, including:  Daily care of your teeth and gums.  Regular physical activity.  Eating a healthy diet.  Avoiding tobacco and drug use.  Limiting alcohol use.  Practicing safe sex.  Taking low-dose aspirin every day.  Taking vitamin and mineral supplements as recommended by your health care provider. What happens during an annual well check? The services and screenings done by your health care provider during your annual well check will depend on your age, overall health, lifestyle risk factors, and  family history of disease. Counseling  Your health care provider may ask you questions about your:  Alcohol use.  Tobacco use.  Drug use.  Emotional well-being.  Home and relationship well-being.  Sexual activity.  Eating habits.  History of falls.  Memory and ability to understand (cognition).  Work and work Statistician.  Reproductive health. Screening  You may have the following tests or measurements:  Height, weight, and BMI.  Blood pressure.  Lipid and cholesterol levels. These may be checked every 5 years, or more frequently if you are over 7 years old.  Skin check.  Lung cancer screening. You may have this screening every year starting at age 30 if you have a 30-pack-year history of smoking and currently smoke or have quit within the past 15 years.  Fecal occult blood test (FOBT) of the stool. You may have this test every year starting at age 35.  Flexible sigmoidoscopy or colonoscopy. You may have a sigmoidoscopy every 5 years or a colonoscopy every 10 years starting at age 30.  Hepatitis C blood test.  Hepatitis B blood test.  Sexually transmitted disease (STD) testing.  Diabetes screening. This is done by checking your blood sugar (glucose) after you have not eaten for a while (fasting). You may have this done every 1-3 years.  Bone density scan. This is done to screen for osteoporosis. You may have this done starting at age 5.  Mammogram. This may be done every 1-2 years. Talk to your health care provider about how often you should have regular mammograms. Talk with your health care provider about your test results, treatment options, and if necessary, the need for more tests. Vaccines  Your health care provider may recommend certain vaccines, such as:  Influenza  vaccine. This is recommended every year.  Tetanus, diphtheria, and acellular pertussis (Tdap, Td) vaccine. You may need a Td booster every 10 years.  Zoster vaccine. You may need this  after age 52.  Pneumococcal 13-valent conjugate (PCV13) vaccine. One dose is recommended after age 75.  Pneumococcal polysaccharide (PPSV23) vaccine. One dose is recommended after age 47. Talk to your health care provider about which screenings and vaccines you need and how often you need them. This information is not intended to replace advice given to you by your health care provider. Make sure you discuss any questions you have with your health care provider. Document Released: 03/02/2015 Document Revised: 10/24/2015 Document Reviewed: 12/05/2014 Elsevier Interactive Patient Education  2017 [place] Prevention in the Home Falls can cause injuries. They can happen to people of all ages. There are many things you can do to make your home safe and to help prevent falls. What can I do on the outside of my home?  Regularly fix the edges of walkways and driveways and fix any cracks.  Remove anything that might make you trip as you walk through a door, such as a raised step or threshold.  Trim any bushes or trees on the path to your home.  Use bright outdoor lighting.  Clear any walking paths of anything that might make someone trip, such as rocks or tools.  Regularly check to see if handrails are loose or broken. Make sure that both sides of any steps have handrails.  Any raised decks and porches should have guardrails on the edges.  Have any leaves, snow, or ice cleared regularly.  Use sand or salt on walking paths during winter.  Clean up any spills in your garage right away. This includes oil or grease spills. What can I do in the bathroom?  Use night lights.  Install grab bars by the toilet and in the tub and shower. Do not use towel bars as grab bars.  Use non-skid mats or decals in the tub or shower.  If you need to sit down in the shower, use a plastic, non-slip stool.  Keep the floor dry. Clean up any water that spills on the floor as soon as it  happens.  Remove soap buildup in the tub or shower regularly.  Attach bath mats securely with double-sided non-slip rug tape.  Do not have throw rugs and other things on the floor that can make you trip. What can I do in the bedroom?  Use night lights.  Make sure that you have a light by your bed that is easy to reach.  Do not use any sheets or blankets that are too big for your bed. They should not hang down onto the floor.  Have a firm chair that has side arms. You can use this for support while you get dressed.  Do not have throw rugs and other things on the floor that can make you trip. What can I do in the kitchen?  Clean up any spills right away.  Avoid walking on wet floors.  Keep items that you use a lot in easy-to-reach places.  If you need to reach something above you, use a strong step stool that has a grab bar.  Keep electrical cords out of the way.  Do not use floor polish or wax that makes floors slippery. If you must use wax, use non-skid floor wax.  Do not have throw rugs and other things on the floor that can  make you trip. What can I do with my stairs?  Do not leave any items on the stairs.  Make sure that there are handrails on both sides of the stairs and use them. Fix handrails that are broken or loose. Make sure that handrails are as long as the stairways.  Check any carpeting to make sure that it is firmly attached to the stairs. Fix any carpet that is loose or worn.  Avoid having throw rugs at the top or bottom of the stairs. If you do have throw rugs, attach them to the floor with carpet tape.  Make sure that you have a light switch at the top of the stairs and the bottom of the stairs. If you do not have them, ask someone to add them for you. What else can I do to help prevent falls?  Wear shoes that:  Do not have high heels.  Have rubber bottoms.  Are comfortable and fit you well.  Are closed at the toe. Do not wear sandals.  If you  use a stepladder:  Make sure that it is fully opened. Do not climb a closed stepladder.  Make sure that both sides of the stepladder are locked into place.  Ask someone to hold it for you, if possible.  Clearly mark and make sure that you can see:  Any grab bars or handrails.  First and last steps.  Where the edge of each step is.  Use tools that help you move around (mobility aids) if they are needed. These include:  Canes.  Walkers.  Scooters.  Crutches.  Turn on the lights when you go into a dark area. Replace any light bulbs as soon as they burn out.  Set up your furniture so you have a clear path. Avoid moving your furniture around.  If any of your floors are uneven, fix them.  If there are any pets around you, be aware of where they are.  Review your medicines with your doctor. Some medicines can make you feel dizzy. This can increase your chance of falling. Ask your doctor what other things that you can do to help prevent falls. This information is not intended to replace advice given to you by your health care provider. Make sure you discuss any questions you have with your health care provider. Document Released: 11/30/2008 Document Revised: 07/12/2015 Document Reviewed: 03/10/2014 Elsevier Interactive Patient Education  2017 [employer].

## 2020-03-08 NOTE — Patient Instructions (Signed)
Hypertension, Adult Hypertension is another name for high blood pressure. High blood pressure forces your heart to work harder to pump blood. This can cause problems over time. There are two numbers in a blood pressure reading. There is a top number (systolic) over a bottom number (diastolic). It is best to have a blood pressure that is below 120/80. Healthy choices can help lower your blood pressure, or you may need medicine to help lower it. What are the causes? The cause of this condition is not known. Some conditions may be related to high blood pressure. What increases the risk?  Smoking.  Having type 2 diabetes mellitus, high cholesterol, or both.  Not getting enough exercise or physical activity.  Being overweight.  Having too much fat, sugar, calories, or salt (sodium) in your diet.  Drinking too much alcohol.  Having long-term (chronic) kidney disease.  Having a family history of high blood pressure.  Age. Risk increases with age.  Race. You may be at higher risk if you are African American.  Gender. Men are at higher risk than women before age 45. After age 65, women are at higher risk than men.  Having obstructive sleep apnea.  Stress. What are the signs or symptoms?  High blood pressure may not cause symptoms. Very high blood pressure (hypertensive crisis) may cause: ? Headache. ? Feelings of worry or nervousness (anxiety). ? Shortness of breath. ? Nosebleed. ? A feeling of being sick to your stomach (nausea). ? Throwing up (vomiting). ? Changes in how you see. ? Very bad chest pain. ? Seizures. How is this treated?  This condition is treated by making healthy lifestyle changes, such as: ? Eating healthy foods. ? Exercising more. ? Drinking less alcohol.  Your health care provider may prescribe medicine if lifestyle changes are not enough to get your blood pressure under control, and if: ? Your top number is above 130. ? Your bottom number is above  80.  Your personal target blood pressure may vary. Follow these instructions at home: Eating and drinking  If told, follow the DASH eating plan. To follow this plan: ? Fill one half of your plate at each meal with fruits and vegetables. ? Fill one fourth of your plate at each meal with whole grains. Whole grains include whole-wheat pasta, brown rice, and whole-grain bread. ? Eat or drink low-fat dairy products, such as skim milk or low-fat yogurt. ? Fill one fourth of your plate at each meal with low-fat (lean) proteins. Low-fat proteins include fish, chicken without skin, eggs, beans, and tofu. ? Avoid fatty meat, cured and processed meat, or chicken with skin. ? Avoid pre-made or processed food.  Eat less than 1,500 mg of salt each day.  Do not drink alcohol if: ? Your doctor tells you not to drink. ? You are pregnant, may be pregnant, or are planning to become pregnant.  If you drink alcohol: ? Limit how much you use to:  0-1 drink a day for women.  0-2 drinks a day for men. ? Be aware of how much alcohol is in your drink. In the [place] one drink equals one 12 oz bottle of beer (355 mL), one 5 oz glass of wine (148 mL), or one 1 oz glass of hard liquor (44 mL).   Lifestyle  Work with your doctor to stay at a healthy weight or to lose weight. Ask your doctor what the best weight is for you.  Get at least 30 minutes of exercise most   days of the week. This may include walking, swimming, or biking.  Get at least 30 minutes of exercise that strengthens your muscles (resistance exercise) at least 3 days a week. This may include lifting weights or doing Pilates.  Do not use any products that contain nicotine or tobacco, such as cigarettes, e-cigarettes, and chewing tobacco. If you need help quitting, ask your doctor.  Check your blood pressure at home as told by your doctor.  Keep all follow-up visits as told by your doctor. This is important.   Medicines  Take over-the-counter  and prescription medicines only as told by your doctor. Follow directions carefully.  Do not skip doses of blood pressure medicine. The medicine does not work as well if you skip doses. Skipping doses also puts you at risk for problems.  Ask your doctor about side effects or reactions to medicines that you should watch for. Contact a doctor if you:  Think you are having a reaction to the medicine you are taking.  Have headaches that keep coming back (recurring).  Feel dizzy.  Have swelling in your ankles.  Have trouble with your vision. Get help right away if you:  Get a very bad headache.  Start to feel mixed up (confused).  Feel weak or numb.  Feel faint.  Have very bad pain in your: ? Chest. ? Belly (abdomen).  Throw up more than once.  Have trouble breathing. Summary  Hypertension is another name for high blood pressure.  High blood pressure forces your heart to work harder to pump blood.  For most people, a normal blood pressure is less than 120/80.  Making healthy choices can help lower blood pressure. If your blood pressure does not get lower with healthy choices, you may need to take medicine. This information is not intended to replace advice given to you by your health care provider. Make sure you discuss any questions you have with your health care provider. Document Revised: 10/14/2017 Document Reviewed: 10/14/2017 Elsevier Patient Education  [place]. Diabetes Mellitus and Exercise Exercising regularly is important for overall health, especially for people who have diabetes mellitus. Exercising is not only about losing weight. It has many other health benefits, such as increasing muscle strength and bone density and reducing body fat and stress. This leads to improved fitness, flexibility, and endurance, all of which result in better overall health. What are the benefits of exercise if I have diabetes? Exercise has many benefits for people with  diabetes. They include:  Helping to lower and control blood sugar (glucose).  Helping the body to respond better to the hormone insulin by improving insulin sensitivity.  Reducing how much insulin the body needs.  Lowering the risk for heart disease by: ? Lowering "bad" cholesterol and triglyceride levels. ? Increasing "good" cholesterol levels. ? Lowering blood pressure. ? Lowering blood glucose levels. What is my activity plan? Your health care provider or certified diabetes educator can help you make a plan for the type and frequency of exercise that works for you. This is called your activity plan. Be sure to:  Get at least 150 minutes of medium-intensity or high-intensity exercise each week. Exercises may include brisk walking, biking, or water aerobics.  Do stretching and strengthening exercises, such as yoga or weight lifting, at least 2 times a week.  Spread out your activity over at least 3 days of the week.  Get some form of physical activity each day. ? Do not go more than 2 days in  a row without some kind of physical activity. ? Avoid being inactive for more than 90 minutes at a time. Take frequent breaks to walk or stretch.  Choose exercises or activities that you enjoy. Set realistic goals.  Start slowly and gradually increase your exercise intensity over time.   How do I manage my diabetes during exercise? Monitor your blood glucose  Check your blood glucose before and after exercising. If your blood glucose is: ? 240 mg/dL (13.3 mmol/L) or higher before you exercise, check your urine for ketones. These are chemicals created by the liver. If you have ketones in your urine, do not exercise until your blood glucose returns to normal. ? 100 mg/dL (5.6 mmol/L) or lower, eat a snack containing 15-20 grams of carbohydrate. Check your blood glucose 15 minutes after the snack to make sure that your glucose level is above 100 mg/dL (5.6 mmol/L) before you start your  exercise.  Know the symptoms of low blood glucose (hypoglycemia) and how to treat it. Your risk for hypoglycemia increases during and after exercise. Follow these tips and your health care provider's instructions  Keep a carbohydrate snack that is fast-acting for use before, during, and after exercise to help prevent or treat hypoglycemia.  Avoid injecting insulin into areas of the body that are going to be exercised. For example, avoid injecting insulin into: ? Your arms, when you are about to play tennis. ? Your legs, when you are about to go jogging.  Keep records of your exercise habits. Doing this can help you and your health care provider adjust your diabetes management plan as needed. Write down: ? Food that you eat before and after you exercise. ? Blood glucose levels before and after you exercise. ? The type and amount of exercise you have done.  Work with your health care provider when you start a new exercise or activity. He or she may need to: ? Make sure that the activity is safe for you. ? Adjust your insulin, other medicines, and food that you eat.  Drink plenty of water while you exercise. This prevents loss of water (dehydration) and problems caused by a lot of heat in the body (heat stroke).   Where to find more information  American Diabetes Association: www.diabetes.org Summary  Exercising regularly is important for overall health, especially for people who have diabetes mellitus.  Exercising has many health benefits. It increases muscle strength and bone density and reduces body fat and stress. It also lowers and controls blood glucose.  Your health care provider or certified diabetes educator can help you make an activity plan for the type and frequency of exercise that works for you.  Work with your health care provider to make sure any new activity is safe for you. Also work with your health care provider to adjust your insulin, other medicines, and the food  you eat. This information is not intended to replace advice given to you by your health care provider. Make sure you discuss any questions you have with your health care provider. Document Revised: 11/01/2018 Document Reviewed: 11/01/2018 Elsevier Patient Education  [place].

## 2020-03-08 NOTE — Progress Notes (Signed)
Rutherford Nail as a scribe for [name], MD.,have documented all relevant documentation on the behalf of [name], MD,as directed by  [name], MD while in the presence of [name], MD. This visit occurred during the SARS-CoV-2 public health emergency.  Safety protocols were in place, including screening questions prior to the visit, additional usage of staff PPE, and extensive cleaning of exam room while observing appropriate contact time as indicated for disinfecting solutions.  Subjective:     Patient ID: [name] , female    DOB: [birth year] , 87 y.o.   MRN: [id number]   Chief Complaint  Patient presents with  . Diabetes  . Hypertension    HPI  She presents today for DM/HTN check. She reports compliance with meds. She denies having any headaches, chest pain and shortness of breath. She has been using [employer]. She had been using a traditional meter prior to this and checking her sugars four times per day.   Diabetes She presents for her follow-up diabetic visit. She has type 2 diabetes mellitus. Her disease course has been stable. There are no hypoglycemic associated symptoms. Pertinent negatives for hypoglycemia include no dizziness or headaches. Pertinent negatives for diabetes include no blurred vision, no fatigue, no polydipsia, no polyphagia and no polyuria. There are no hypoglycemic complications. Risk factors for coronary artery disease include dyslipidemia, hypertension, post-menopausal and diabetes mellitus. Current diabetic treatment includes oral agent (monotherapy).  Hypertension This is a chronic problem. The current episode started more than 1 year ago. The problem has been gradually improving since onset. The problem is controlled. Pertinent negatives include no blurred vision or headaches.     Past Medical History:  Diagnosis Date  . Cerebrovascular disease, unspecified   . Coronary atherosclerosis of unspecified type  of vessel, native or graft   . Diabetes mellitus without complication ([place])   . Other and unspecified hyperlipidemia   . Rheumatoid arthritis(714.0)   . Shingles 2003  . Unspecified essential hypertension      Family History  Problem Relation Age of Onset  . Cancer Mother   . Hypertension Father   . Cancer Brother   . Hypertension Paternal Grandmother   . Heart disease Brother   . Diabetes Neg Hx   . Coronary artery disease Neg Hx      Current Outpatient Medications:  .  Accu-Chek Softclix Lancets lancets, Use as instructed to check blood sugars up to 3 times a day  Dx code e11.65, Disp: 100 each, Rfl: 12 .  acetaminophen (TYLENOL) 650 MG CR tablet, Take 650 mg by mouth every 8 (eight) hours as needed for pain., Disp: , Rfl:  .  amLODipine (NORVASC) 5 MG tablet, TAKE 1 TABLET(5 MG) BY MOUTH DAILY, Disp: 90 tablet, Rfl: 2 .  Blood Glucose Calibration (ACCU-CHEK AVIVA) SOLN, Use with accu-check aviva machine, Disp: 1 each, Rfl: 3 .  Blood Glucose Monitoring Suppl (ACCU-CHEK AVIVA PLUS) w/Device KIT, Use to check  Blood sugars up to 3 times a day.  Dx code e11.65, Disp: 1 kit, Rfl: 3 .  carvedilol (COREG) 12.5 MG tablet, TAKE 1 TABLET(12.5 MG) BY MOUTH TWICE DAILY WITH A MEAL, Disp: 180 tablet, Rfl: 1 .  chlorthalidone (HYGROTON) 25 MG tablet, Take 12.5 mg by mouth daily., Disp: , Rfl:  .  Cholecalciferol (VITAMIN D) 125 MCG (5000 UT) CAPS, Take by mouth., Disp: , Rfl:  .  dipyridamole-aspirin (AGGRENOX) 200-25 MG 12hr capsule, Take 1 capsule by mouth daily.,  Disp: 90 capsule, Rfl: 1 .  glimepiride (AMARYL) 1 MG tablet, 1/2 tab po M/W/F, Disp: 36 tablet, Rfl: 1 .  glucosamine-chondroitin 500-400 MG tablet, Take 1 tablet by mouth 3 (three) times daily., Disp: , Rfl:  .  glucose blood test strip, Use as instructed to check blood sugars up to 3 times a day.  Dx code e11.65, Disp: 100 each, Rfl: 12 .  irbesartan (AVAPRO) 150 MG tablet, Take 1 tablet (150 mg total) by mouth daily., Disp: 30  tablet, Rfl: 11 .  Multiple Vitamin (MULTIVITAMIN) capsule, Take 1 capsule by mouth daily., Disp: , Rfl:  .  nitroGLYCERIN (NITROSTAT) 0.4 MG SL tablet, PLACE 1 TABLET UNDER THE TONGUE AT THE 1ST SIGN OF ATTACK, MAY REPEAT EVERY 5 MINUTES FOR 3 DOSES IN 15 MINUTES, IF PAIN PERSISTS, CALL 911, Disp: 50 tablet, Rfl: 0 .  rosuvastatin (CRESTOR) 5 MG tablet, One tab po M- F, skip Sat/Sun, Disp: 90 tablet, Rfl: 1   No Known Allergies   Review of Systems  Constitutional: Negative.  Negative for fatigue.  HENT: Negative.   Eyes: Negative for blurred vision.  Respiratory: Negative.   Cardiovascular: Negative.   Endocrine: Negative for polydipsia, polyphagia and polyuria.  Musculoskeletal: Negative.   Skin: Negative.   Neurological: Negative for dizziness and headaches.  Hematological: Negative.   Psychiatric/Behavioral: Negative.      Today's Vitals   03/08/20 1424  BP: (!) 150/60  Pulse: 69  Temp: 98.2 F (36.8 C)  TempSrc: Oral  Weight: 142 lb (64.4 kg)  Height: 5' (1.524 m)   Body mass index is 27.73 kg/m.  Wt Readings from Last 3 Encounters:  03/08/20 142 lb 9.6 oz (64.7 kg)  03/08/20 142 lb (64.4 kg)  01/26/20 139 lb 3.2 oz (63.1 kg)   Objective:  Physical Exam Vitals and nursing note reviewed.  Constitutional:      Appearance: Normal appearance.  HENT:     Head: Normocephalic and atraumatic.     Nose:     Comments: Masked     Mouth/Throat:     Comments: Masked  Cardiovascular:     Rate and Rhythm: Normal rate and regular rhythm.     Heart sounds: Normal heart sounds.  Pulmonary:     Effort: Pulmonary effort is normal.     Breath sounds: Normal breath sounds.  Skin:    General: Skin is warm.  Neurological:     General: No focal deficit present.     Mental Status: She is alert.  Psychiatric:        Mood and Affect: Mood normal.        Behavior: Behavior normal.         Assessment And Plan:     1. Type 2 diabetes mellitus with stage 3 chronic kidney  disease, without long-term current use of insulin, unspecified whether stage 3a or 3b CKD ([place]) Comments: Chronic, I will check labs as listed below. I will check renal lab as well. I will also forward her labs to Dr. Candiss Norse, her nephrologist.  - BMP8+EGFR - Hemoglobin A1c - Protein electrophoresis, serum - Phosphorus - CBC no Diff - glimepiride (AMARYL) 1 MG tablet; 1/2 tab po M/W/F  Dispense: 36 tablet; Refill: 1  2. Hypertensive heart and renal disease with renal failure, stage 1 through stage 4 or unspecified chronic kidney disease, without heart failure Comments: Chronic, uncontrolled. She is encouraged to follow low sodium diet. She may not have taken meds today, she can't recall. I  will not change meds today.  - carvedilol (COREG) 12.5 MG tablet; TAKE 1 TABLET(12.5 MG) BY MOUTH TWICE DAILY WITH A MEAL  Dispense: 180 tablet; Refill: 1  3. Atherosclerosis of native coronary artery of native heart without angina pectoris Comments: Chronic, encouraged to follow heart healthy lifestyle. She is advised to comply with her statin regimen as well.  - dipyridamole-aspirin (AGGRENOX) 200-25 MG 12hr capsule; Take 1 capsule by mouth daily.  Dispense: 90 capsule; Refill: 1 - rosuvastatin (CRESTOR) 5 MG tablet; One tab po M- F, skip Sat/Sun  Dispense: 90 tablet; Refill: 1   Patient was given opportunity to ask questions. Patient verbalized understanding of the plan and was able to repeat key elements of the plan. All questions were answered to their satisfaction.  [name], MD   I, [name], MD, have reviewed all documentation for this visit. The documentation on 03/22/20 for the exam, diagnosis, procedures, and orders are all accurate and complete.  THE PATIENT IS ENCOURAGED TO PRACTICE SOCIAL DISTANCING DUE TO THE COVID-19 PANDEMIC.

## 2020-03-08 NOTE — Progress Notes (Signed)
This visit occurred during the SARS-CoV-2 public health emergency.  Safety protocols were in place, including screening questions prior to the visit, additional usage of staff PPE, and extensive cleaning of exam room while observing appropriate contact time as indicated for disinfecting solutions.  Subjective:   [name] is a 87 y.o. female who presents for Medicare Annual (Subsequent) preventive examination.  Review of Systems     Cardiac Risk Factors include: advanced age (>16mn, >>19women);diabetes mellitus;dyslipidemia;hypertension;sedentary lifestyle     Objective:    Today's Vitals   03/08/20 1404  BP: (!) 150/60  Pulse: 69  Temp: 98.2 F (36.8 C)  TempSrc: Oral  SpO2: 99%  Weight: 142 lb 9.6 oz (64.7 kg)  Height: 5' (1.524 m)   Body mass index is 27.85 kg/m.  Advanced Directives 03/08/2020 11/09/2019 09/16/2018  Does Patient Have a Medical Advance Directive? Yes No Yes  Type of [place] will - [place] will  Copy of [place] Chart? No - copy requested - No - copy requested    Current Medications (verified) Outpatient Encounter Medications as of 03/08/2020  Medication Sig  . Accu-Chek Softclix Lancets lancets Use as instructed to check blood sugars up to 3 times a day  Dx code e11.65  . acetaminophen (TYLENOL) 650 MG CR tablet Take 650 mg by mouth every 8 (eight) hours as needed for pain.  .Marland KitchenamLODipine (NORVASC) 5 MG tablet TAKE 1 TABLET(5 MG) BY MOUTH DAILY  . Blood Glucose Calibration (ACCU-CHEK AVIVA) SOLN Use with accu-check aviva machine  . Blood Glucose Monitoring Suppl (ACCU-CHEK AVIVA PLUS) w/Device KIT Use to check  Blood sugars up to 3 times a day.  Dx code e11.65  . carvedilol (COREG) 12.5 MG tablet TAKE 1 TABLET(12.5 MG) BY MOUTH TWICE DAILY WITH A MEAL  . chlorthalidone (HYGROTON) 25 MG tablet Take 12.5 mg by mouth daily.  . Cholecalciferol (VITAMIN D)  125 MCG (5000 UT) CAPS Take by mouth.  . dipyridamole-aspirin (AGGRENOX) 200-25 MG 12hr capsule TAKE 1 CAPSULE BY MOUTH EVERY DAY  . glimepiride (AMARYL) 1 MG tablet TAKE 1/2 TABLET BY MOUTH ON MONDAY, WEDNESDAY AND FRIDAY  . glucosamine-chondroitin 500-400 MG tablet Take 1 tablet by mouth 3 (three) times daily.  .Marland Kitchenglucose blood test strip Use as instructed to check blood sugars up to 3 times a day.  Dx code e11.65  . irbesartan (AVAPRO) 150 MG tablet Take 1 tablet (150 mg total) by mouth daily.  . Multiple Vitamin (MULTIVITAMIN) capsule Take 1 capsule by mouth daily.  . nitroGLYCERIN (NITROSTAT) 0.4 MG SL tablet PLACE 1 TABLET UNDER THE TONGUE AT THE 1ST SIGN OF ATTACK, MAY REPEAT EVERY 5 MINUTES FOR 3 DOSES IN 15 MINUTES, IF PAIN PERSISTS, CALL 911  . rosuvastatin (CRESTOR) 5 MG tablet TAKE 1 TABLET(5 MG) BY MOUTH DAILY   No facility-administered encounter medications on file as of 03/08/2020.    Allergies (verified) Patient has no known allergies.   History: Past Medical History:  Diagnosis Date  . Cerebrovascular disease, unspecified   . Coronary atherosclerosis of unspecified type of vessel, native or graft   . Diabetes mellitus without complication [place]   . Other and unspecified hyperlipidemia   . Rheumatoid arthritis(714.0)   . Shingles 2003  . Unspecified essential hypertension    Past Surgical History:  Procedure Laterality Date  . Cardiac Bypass  2003  . CATARACT EXTRACTION, BILATERAL  2007   Family History  Problem Relation  Age of Onset  . Cancer Mother   . Hypertension Father   . Cancer Brother   . Hypertension Paternal Grandmother   . Heart disease Brother   . Diabetes Neg Hx   . Coronary artery disease Neg Hx    Social History   Socioeconomic History  . Marital status: Widowed    Spouse name: Not on file  . Number of children: Not on file  . Years of education: Not on file  . Highest education level: Not on file  Occupational History  . Occupation:  retired  Tobacco Use  . Smoking status: Never Smoker  . Smokeless tobacco: Never Used  Vaping Use  . Vaping Use: Never used  Substance and Sexual Activity  . Alcohol use: No  . Drug use: No  . Sexual activity: Not Currently  Other Topics Concern  . Not on file  Social History Narrative   Retired   Tobacco use- no   Alcohol use-no         Social Determinants of Radio broadcast assistant Strain: Low Risk   . Difficulty of Paying Living Expenses: Not hard at all  Food Insecurity: No Food Insecurity  . Worried About Charity fundraiser in the Last Year: Never true  . Ran Out of Food in the Last Year: Never true  Transportation Needs: No Transportation Needs  . Lack of Transportation (Medical): No  . Lack of Transportation (Non-Medical): No  Physical Activity: Inactive  . Days of Exercise per Week: 0 days  . Minutes of Exercise per Session: 0 min  Stress: No Stress Concern Present  . Feeling of Stress : Not at all  Social Connections: Not on file    Tobacco Counseling Counseling given: Not Answered   Clinical Intake:  Pre-visit preparation completed: Yes  Pain : No/denies pain     Nutritional Status: BMI 25 -29 Overweight Nutritional Risks: None Diabetes: Yes  How often do you need to have someone help you when you read instructions, pamphlets, or other written materials from your doctor or pharmacy?: 1 - Never What is the last grade level you completed in school?: 65yr college  Diabetic? Yes Nutrition Risk Assessment:  Has the patient had any N/V/D within the last 2 months?  No  Does the patient have any non-healing wounds?  No  Has the patient had any unintentional weight loss or weight gain?  No   Diabetes:  Is the patient diabetic?  Yes  If diabetic, was a CBG obtained today?  No  Did the patient bring in their glucometer from home?  No  How often do you monitor your CBG's? Automatically with sensor.   Financial Strains and Diabetes  Management:  Are you having any financial strains with the device, your supplies or your medication? No .  Does the patient want to be seen by Chronic Care Management for management of their diabetes?  No  Would the patient like to be referred to a Nutritionist or for Diabetic Management?  No   Diabetic Exams:  Diabetic Eye Exam: Completed 01/25/2020 Diabetic Foot Exam: Completed 11/07/2019   Interpreter Needed?: No  Information entered by :: NAllen LPN   Activities of Daily Living In your present state of health, do you have any difficulty performing the following activities: 03/08/2020 11/09/2019  Hearing? N N  Vision? N N  Difficulty concentrating or making decisions? N N  Walking or climbing stairs? Y Y  Comment - uses cAirline pilotor  bathing? N N  Doing errands, shopping? N N  Preparing Food and eating ? N N  Using the Toilet? N N  In the past six months, have you accidently leaked urine? Y Y  Comment - wears depends  Do you have problems with loss of bowel control? N N  Managing your Medications? N N  Managing your Finances? N N  Housekeeping or managing your Housekeeping? N N  Some recent data might be hidden    Patient Care Team: Glendale Chard, MD as PCP - General (Internal Medicine) Josue Hector, MD as PCP - Cardiology (Cardiology) Little, Claudette Stapler, RN as Case Manager  Indicate any recent Medical Services you may have received from other than [facility] providers in the past year (date may be approximate).     Assessment:   This is a routine wellness examination for [name].  Hearing/Vision screen No exam data present  Dietary issues and exercise activities discussed: Current Exercise Habits: The patient does not participate in regular exercise at present  Goals    .  "I would like to better manage my diabetes" (pt-stated)      Current Barriers:  [name] Knowledge Deficits related to diabetes disease process and Self Health management   Nurse Case Manager  Clinical Goal(s):  [name] Over the next 90 days, patient will work with the CCM team to address needs related to diabetes disease management and lowering her A1C  CCM RN CM Interventions:  11/10/18 call completed with patient   . Evaluation of current treatment plan related to diabetes mellitus and patient's adherence to plan as established by provider. . Provided education to patient re: current A1C of 6.7 obtained on 09/16/18; discussed this is within target range; discussed the nondiabetic target range and discussed ways to reach this goal by adhering to a diabetic friendly diet, taking diabetic medications exactly as prescribed, implementing exercise in her daily routine (discussed ADA recommendations of 150 minutes weekly); discussed patient's average BG runs around 80-112 qd before breakfast; educated patient on s/s of hypo/hyperglycemia and how to respond to readings <80 and or >200  . Reviewed medications with patient and discussed patient is taking Glimepiride 1 mg 1/2 tablet daily w/o noted SE   . Discussed plans with patient for ongoing care management follow up and provided patient with direct contact information for care management team . Provided patient with diabetic educational materials related to "Manage Your Diabetes", "Diabetes Meal Planning" . Advised patient, providing education and rationale, to check cbg before meals daily and record, calling PCP and or CCM team for findings outside established parameters.  , "signs/symptoms of Hypo/Hyperglycemia"  CCM PharmD Interventions:  01/12/19 call completed with patient  . T2DM, last A1c was 6.8% on 12/28/18 . Patient remains on glimepiride 1/2 tablet on M, W, F; denies hypoglycemia o Glimepiride not ideal in 89yoF due to risk of hypglycemia o Counseled on hypoglycemia and how to react to Bg<70 o Patient does not wish to change regimen at this time.  Will continue for now . FBG 100s-120s . She is taking an ARB and statin (M-F  only)  Patient Self Care Activities:  . Self administers medications as prescribed . Attends all scheduled provider appointments . Calls pharmacy for medication refills . Performs ADL's independently . Performs IADL's independently . Calls provider office for new concerns or questions  Please see past updates related to this goal by clicking on the "Past Updates" button in the selected goal      .  "  I would like to continue to stay active despite my arthritic hip pain" (pt-stated)      Current Barriers:  . 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 Limited ROM secondary to having Arthritic pain   Nurse Case Manager Clinical Goal(s):  [name] Over the next 90 days, patient will maintain her ability to perform Self care and stay physically active  CCM RN CM Interventions:  11/10/18 call completed with patient   . Evaluation of current treatment plan related to bilateral hip pain and patient's adherence to plan as established by provider; discussed patient manages this hip pain by staying active within her home by stair climbing and moving around within her home while attending to her daily activities and house chores . Discussed currently patient is satisfied with her treatment plan for management of her arthritis  . Discussed plans with patient for ongoing care management follow up and provided patient with direct contact information for care management team  Patient Self Care Activities:  . Self administers medications as prescribed . Attends all scheduled provider appointments . Calls pharmacy for medication refills . Performs ADL's independently . Performs IADL's independently . Calls provider office for new concerns or questions  Initial goal documentation     .  "What is a normal BP" (pt-stated)      Current Barriers:  [name] Knowledge Deficits related to disease process and Self Health management for Hypertension  Nurse Case Manager Clinical Goal(s):  [name] Over the next 60 days, patient will  verbalize basic understanding of Hypertension  disease process and self health management plan as evidenced by patient will log her BP taken at home and will report consistent BP readings of 130/80 or lower  CCM RN CM Interventions:  11/10/18 call completed with patient   . Evaluation of current treatment plan related to Hypertension and patient's adherence to plan as established by provider. . Provided education to patient re: the importance of keeping BP well controlled to help reduce the risk of potential cardiovascular complications and kidney disease; discussed importance of avoiding tobacco products, implementing exercise in her daily routine, getting enough sleep and maintaining body weight within recommended BMI; educated patient on importance of adhering to a low Sodium diet and eating a well balanced heart healthy diet; discussed patient has a BP cuff in her home and feels comfortable monitoring her BP as needed; patient encouraged to monitor her BP routinely and to record her readings alerting the CCM team and or PCP of abnormal readings; discussed target BP 130/80  . Reviewed medications with patient and discussed patient's current pharmacological treatment plan; discussed medication indication, dosage and frequency; discussed patient has a good understanding about each of her medications and is satisfied with her HTN management . Discussed plans with patient for ongoing care management follow up and provided patient with direct contact information for care management team . Provided patient with printed educational materials related to "My BP Log"  Patient Self Care Activities:  . Self administers medications as prescribed . Attends all scheduled provider appointments . Calls pharmacy for medication refills . Performs ADL's independently . Performs IADL's independently . Calls provider office for new concerns or questions  Initial goal documentation     .  Exercise 150 min/wk  Moderate Activity      09/16/2018, wants to exercise more    .  My aggrenox is expensive (pt-stated)      Current Barriers:  . Financial Barriers: patient has [facility] insurance and reports copay for Aggrenox (generic)  is cost prohibitive at this time  Pharmacist Clinical Goal(s):  [name] Over the next 30 days, patient will work with PharmD and providers to relieve medication access concerns  Interventions: Completed call with patient on 01/12/19 . Comprehensive medication review completed; medication list updated in electronic medical record.  . Call placed to pharmacy.  Patient is in the coverage gap and paying over $100 month for generic Aggrenox . Outreach to PharmD at [facility] to investigate if patient should remain on Aggrenox or is there an alternative agent we can use.  It appears patient has been on this drug since 2015 (stroke).  Patient is tolerating this medication and denies adverse events.   . Encouraged patient to ask about Aggrenox at upcoming cardiology visit on 01/24/19 . Will continue to follow  Patient Self Care Activities:  . Patient will provide necessary portions of application   Please see past updates related to this goal by clicking on the "Past Updates" button in the selected goal      .  Patient Stated      11/09/2019, wants to walk without cane or walker    .  Patient Stated      03/08/2020, wants to be able to walk better      Depression Screen PHQ 2/9 Scores 03/08/2020 11/09/2019 09/22/2018 09/16/2018 03/16/2018 12/10/2017 05/10/2015  PHQ - 2 Score 0 0 0 0 0 0 0  PHQ- 9 Score - - 0 0 - - -    Fall Risk Fall Risk  03/08/2020 11/09/2019 12/28/2018 09/16/2018 09/16/2018  Falls in the past year? 0 0 0 0 0  Risk for fall due to : Impaired balance/gait;Impaired mobility;Medication side effect Medication side effect - - Impaired balance/gait;Impaired mobility;Medication side effect  Follow up Falls evaluation completed;Education provided;Falls prevention discussed  Falls evaluation completed;Education provided;Falls prevention discussed - - Falls evaluation completed;Education provided;Falls prevention discussed    FALL RISK PREVENTION PERTAINING TO THE HOME:  Any stairs in or around the home? Yes  If so, are there any without handrails? No  Home free of loose throw rugs in walkways, pet beds, electrical cords, etc? Yes  Adequate lighting in your home to reduce risk of falls? Yes   ASSISTIVE DEVICES UTILIZED TO PREVENT FALLS:  Life alert? No  Use of a cane, walker or w/c? Yes  Grab bars in the bathroom? Yes  Shower chair or bench in shower? Yes  Elevated toilet seat or a handicapped toilet? No   TIMED UP AND GO:  Was the test performed? No .   Gait slow and steady with assistive device  Cognitive Function:     6CIT Screen 03/08/2020 11/09/2019 09/16/2018  What Year? 0 points 0 points 0 points  What month? 0 points 0 points 0 points  What time? 0 points 0 points 0 points  Count back from 20 4 points 4 points 4 points  Months in reverse 4 points 4 points 0 points  Repeat phrase 6 points 0 points 0 points  Total Score '14 8 4    ' Immunizations Immunization History  Administered Date(s) Administered  . Fluad Quad(high Dose 65+) 11/26/2018, 11/07/2019  . Influenza, High Dose Seasonal PF 11/21/2017  . Influenza-Unspecified 11/17/2012, 11/20/2017  . PFIZER(Purple Top)SARS-COV-2 Vaccination 04/01/2019, 04/26/2019  . Pneumococcal Polysaccharide-23 12/28/2018  . Zoster Recombinat (Shingrix) 11/26/2018    TDAP status: Due, Education has been provided regarding the importance of this vaccine. Advised may receive this vaccine at local pharmacy or Health Dept. Aware to provide a copy  of the vaccination record if obtained from local pharmacy or Health Dept. Verbalized acceptance and understanding.  Flu Vaccine status: Up to date  Pneumococcal vaccine status: Up to date  Covid-19 vaccine status: Completed vaccines  Qualifies for Shingles  Vaccine? Yes   Zostavax completed No   Shingrix Completed?: Yes  Screening Tests Health Maintenance  Topic Date Due  . TETANUS/TDAP  Never done  . OPHTHALMOLOGY EXAM  08/18/2018  . COVID-19 Vaccine (3 - Booster for Pfizer series) 10/27/2019  . MAMMOGRAM  03/23/2020  . HEMOGLOBIN A1C  05/06/2020  . FOOT EXAM  11/06/2020  . INFLUENZA VACCINE  Completed  . DEXA SCAN  Completed  . PNA vac Low Risk Adult  Completed    Health Maintenance  Health Maintenance Due  Topic Date Due  . TETANUS/TDAP  Never done  . OPHTHALMOLOGY EXAM  08/18/2018  . COVID-19 Vaccine (3 - Booster for Pfizer series) 10/27/2019    Colorectal cancer screening: No longer required.   Mammogram status: Completed 03/24/2019. Repeat every year  Bone Density status: Completed 03/18/2018.   Lung Cancer Screening: (Low Dose CT Chest recommended if Age 81-80 years, 30 pack-year currently smoking OR have quit w/in 15years.) does not qualify.   Lung Cancer Screening Referral: no  Additional Screening:  Hepatitis C Screening: does not qualify;   Vision Screening: Recommended annual ophthalmology exams for early detection of glaucoma and other disorders of the eye. Is the patient up to date with their annual eye exam?  Yes  Who is the provider or what is the name of the office in which the patient attends annual eye exams? [facility] If pt is not established with a provider, would they like to be referred to a provider to establish care? No .   Dental Screening: Recommended annual dental exams for proper oral hygiene  Community Resource Referral / Chronic Care Management: CRR required this visit?  No   CCM required this visit?  No      Plan:     I have personally reviewed and noted the following in the patient's chart:   . Medical and social history . Use of alcohol, tobacco or illicit drugs  . Current medications and supplements . Functional ability and status . Nutritional status . Physical  activity . Advanced directives . List of other physicians . Hospitalizations, surgeries, and ER visits in previous 12 months . Vitals . Screenings to include cognitive, depression, and falls . Referrals and appointments  In addition, I have reviewed and discussed with patient certain preventive protocols, quality metrics, and best practice recommendations. A written personalized care plan for preventive services as well as general preventive health recommendations were provided to patient.     Kellie Simmering, LPN   [id number]/0141   Nurse Notes:

## 2020-03-09 ENCOUNTER — Telehealth: Payer: Medicare PPO

## 2020-03-12 LAB — CBC
Hematocrit: 31.5 % — ABNORMAL LOW (ref 34.0–46.6)
Hemoglobin: 10.7 g/dL — ABNORMAL LOW (ref 11.1–15.9)
MCH: 32.5 pg (ref 26.6–33.0)
MCHC: 34 g/dL (ref 31.5–35.7)
MCV: 96 fL (ref 79–97)
Platelets: 198 10*3/uL (ref 150–450)
RBC: 3.29 x10E6/uL — ABNORMAL LOW (ref 3.77–5.28)
RDW: 12.4 % (ref 11.7–15.4)
WBC: 4.7 10*3/uL (ref 3.4–10.8)

## 2020-03-12 LAB — BMP8+EGFR
BUN/Creatinine Ratio: 28 (ref 12–28)
BUN: 56 mg/dL — ABNORMAL HIGH (ref 10–36)
CO2: 26 mmol/L (ref 20–29)
Calcium: 9.5 mg/dL (ref 8.7–10.3)
Chloride: 104 mmol/L (ref 96–106)
Creatinine, Ser: 1.99 mg/dL — ABNORMAL HIGH (ref 0.57–1.00)
GFR calc Af Amer: 25 mL/min/{1.73_m2} — ABNORMAL LOW (ref >59)
GFR calc non Af Amer: 22 mL/min/{1.73_m2} — ABNORMAL LOW (ref >59)
Glucose: 99 mg/dL (ref 65–99)
Potassium: 4.8 mmol/L (ref 3.5–5.2)
Sodium: 141 mmol/L (ref 134–144)

## 2020-03-12 LAB — PROTEIN ELECTROPHORESIS, SERUM
A/G Ratio: 1.1 (ref 0.7–1.7)
Albumin ELP: 3.6 g/dL (ref 2.9–4.4)
Alpha 1: 0.2 g/dL (ref 0.0–0.4)
Alpha 2: 0.9 g/dL (ref 0.4–1.0)
Beta: 1 g/dL (ref 0.7–1.3)
Gamma Globulin: 1.3 g/dL (ref 0.4–1.8)
Globulin, Total: 3.3 g/dL (ref 2.2–3.9)
Total Protein: 6.9 g/dL (ref 6.0–8.5)

## 2020-03-12 LAB — HEMOGLOBIN A1C
Est. average glucose Bld gHb Est-mCnc: 137 mg/dL
Hgb A1c MFr Bld: 6.4 % — ABNORMAL HIGH (ref 4.8–5.6)

## 2020-03-12 LAB — PHOSPHORUS: Phosphorus: 3.6 mg/dL (ref 3.0–4.3)

## 2020-03-22 DIAGNOSIS — I1310 Hypertensive heart and chronic kidney disease without heart failure, with stage 1 through stage 4 chronic kidney disease, or unspecified chronic kidney disease: Secondary | ICD-10-CM | POA: Insufficient documentation

## 2020-03-29 DIAGNOSIS — Z1231 Encounter for screening mammogram for malignant neoplasm of breast: Secondary | ICD-10-CM | POA: Diagnosis not present

## 2020-03-29 LAB — HM MAMMOGRAPHY

## 2020-04-03 ENCOUNTER — Encounter: Payer: Self-pay | Admitting: Internal Medicine

## 2020-04-13 ENCOUNTER — Ambulatory Visit (INDEPENDENT_AMBULATORY_CARE_PROVIDER_SITE_OTHER): Payer: Medicare HMO

## 2020-04-13 ENCOUNTER — Telehealth: Payer: Medicare HMO

## 2020-04-13 DIAGNOSIS — E1122 Type 2 diabetes mellitus with diabetic chronic kidney disease: Secondary | ICD-10-CM

## 2020-04-13 DIAGNOSIS — M069 Rheumatoid arthritis, unspecified: Secondary | ICD-10-CM

## 2020-04-13 DIAGNOSIS — I739 Peripheral vascular disease, unspecified: Secondary | ICD-10-CM

## 2020-04-13 DIAGNOSIS — I1310 Hypertensive heart and chronic kidney disease without heart failure, with stage 1 through stage 4 chronic kidney disease, or unspecified chronic kidney disease: Secondary | ICD-10-CM

## 2020-04-13 DIAGNOSIS — I2510 Atherosclerotic heart disease of native coronary artery without angina pectoris: Secondary | ICD-10-CM | POA: Diagnosis not present

## 2020-04-13 DIAGNOSIS — N183 Chronic kidney disease, stage 3 unspecified: Secondary | ICD-10-CM

## 2020-04-16 NOTE — Patient Instructions (Signed)
Goals Addressed      Other   .  Diabetes Management - Disease Progression Prevented or Minimized   On track     Timeframe:  Long-Range Goal Priority:  Medium Start Date: 04/13/20                            Expected End Date:  10/11/20  Next Follow Up Date: 07/20/20        Patient Goals/Self-Care Activities:  . Self administer medications as prescribed . Attend all scheduled provider appointments . Call pharmacy for medication refills . Call provider office for new concerns or questions . Continue to monitor CBG's as discussed . Continue to adhere to dietary and exercise recommendations                   .  Rheumatoid Arthritis - Disease Progression Prevented or Minimized   On track     Timeframe:  Long-Range Goal Priority:  High Start Date: 04/13/20                             Expected End Date: 10/11/20   Next Follow Up Date: 07/20/20     Patient Goals/Self-Care Activities:  . Self administer medications as prescribed . Attend all scheduled provider appointments . Call pharmacy for medication refills . Call provider office for new concerns or questions . Continue to follow exercise recommendations and alert Rheumatologist of new or worsening symptoms                 .  Track and Manage My Blood Pressure-Hypertension   On track     Timeframe:  Long-Range Goal Priority:  Medium Start Date: 04/13/20                           Expected End Date:  10/11/20                     Follow Up Date: 07/20/20   . Attend all scheduled provider appointments . Call pharmacy for medication refills . Call provider office for new concerns or questions . Continue to adhere to a low sodium diet as discussed . Continue to adhere to dietary and exercise recommendations . Continue to monitor BP at home and record readings, reporting abnormal readings to PCP as discussed    Why is this important?    You won't feel high blood pressure, but it can still hurt your blood vessels.   High blood pressure  can cause heart or kidney problems. It can also cause a stroke.   Making lifestyle changes like losing a [name] weight or eating less salt will help.   Checking your blood pressure at home and at different times of the day can help to control blood pressure.   If the doctor prescribes medicine remember to take it the way the doctor ordered.   Call the office if you cannot afford the medicine or if there are questions about it.     Notes:

## 2020-04-16 NOTE — Chronic Care Management (AMB) (Signed)
Chronic Care Management   CCM RN Visit Note  04/13/2020 Name: [name] MRN: [id number] DOB: 07-Apr-1927  Subjective: [name] is a 87 y.o. year old female who is a primary care patient of Glendale Chard, MD. The care management team was consulted for assistance with disease management and care coordination needs.    Engaged with patient by telephone for follow up visit in response to provider referral for case management and/or care coordination services.   Consent to Services:  The patient was given information about Chronic Care Management services, agreed to services, and gave verbal consent prior to initiation of services.  Please see initial visit note for detailed documentation.   Patient agreed to services and verbal consent obtained.   Assessment: Review of patient past medical history, allergies, medications, health status, including review of consultants reports, laboratory and other test data, was performed as part of comprehensive evaluation and provision of chronic care management services.   SDOH (Social Determinants of Health) assessments and interventions performed:  Yes, no acute challenges   CCM Care Plan  No Known Allergies  Outpatient Encounter Medications as of 04/13/2020  Medication Sig  . Accu-Chek Softclix Lancets lancets Use as instructed to check blood sugars up to 3 times a day  Dx code e11.65  . acetaminophen (TYLENOL) 650 MG CR tablet Take 650 mg by mouth every 8 (eight) hours as needed for pain.  [name] amLODipine (NORVASC) 5 MG tablet TAKE 1 TABLET(5 MG) BY MOUTH DAILY  . Blood Glucose Calibration (ACCU-CHEK AVIVA) SOLN Use with accu-check aviva machine  . Blood Glucose Monitoring Suppl (ACCU-CHEK AVIVA PLUS) w/Device KIT Use to check  Blood sugars up to 3 times a day.  Dx code e11.65  . carvedilol (COREG) 12.5 MG tablet TAKE 1 TABLET(12.5 MG) BY MOUTH TWICE DAILY WITH A MEAL  . chlorthalidone (HYGROTON) 25 MG tablet Take 12.5 mg by mouth  daily.  . Cholecalciferol (VITAMIN D) 125 MCG (5000 UT) CAPS Take by mouth.  . dipyridamole-aspirin (AGGRENOX) 200-25 MG 12hr capsule Take 1 capsule by mouth daily.  [name] glimepiride (AMARYL) 1 MG tablet 1/2 tab po M/W/F  . glucosamine-chondroitin 500-400 MG tablet Take 1 tablet by mouth 3 (three) times daily.  [name] glucose blood test strip Use as instructed to check blood sugars up to 3 times a day.  Dx code e11.65  . irbesartan (AVAPRO) 150 MG tablet Take 1 tablet (150 mg total) by mouth daily.  . Multiple Vitamin (MULTIVITAMIN) capsule Take 1 capsule by mouth daily.  . nitroGLYCERIN (NITROSTAT) 0.4 MG SL tablet PLACE 1 TABLET UNDER THE TONGUE AT THE 1ST SIGN OF ATTACK, MAY REPEAT EVERY 5 MINUTES FOR 3 DOSES IN 15 MINUTES, IF PAIN PERSISTS, CALL 911  . rosuvastatin (CRESTOR) 5 MG tablet One tab po M- F, skip Sat/Sun   No facility-administered encounter medications on file as of 04/13/2020.    Patient Active Problem List   Diagnosis Date Noted  . Hypertensive heart and renal disease 03/22/2020  . Gout 12/07/2017  . Type 2 diabetes mellitus with stage 3 chronic kidney disease, without long-term current use of insulin ([place]) 02/11/2014  . History of coronary artery bypass graft 07/01/2010  . MYOCARDIAL INFARCTION, INFERIOR WALL, SUBSEQUENT CARE 05/12/2008  . CAD, NATIVE VESSEL 05/12/2008  . HYPERLIPIDEMIA-MIXED 05/11/2008  . Essential hypertension 05/11/2008  . CAD, UNSPECIFIED SITE 05/11/2008  . CEREBROVASCULAR DISEASE 05/11/2008  . Rheumatoid arthritis ([place]) 05/11/2008    Conditions to be addressed/monitored:DM related to type 2 with stage 3  CKD, Rheumatoid Arthritis, HTN, PAD, CAD   Care Plan : Diabetes Type 2 (Adult)  Updates made by Lynne Logan, RN since 04/16/2020 12:00 AM    Problem: Disease Progression (Diabetes, Type 2)   Priority: Medium    Long-Range Goal: Disease Progression Prevented or Minimized   Start Date: 04/13/2020  Expected End Date: 10/11/2020  This Visit's  Progress: On track  Priority: Medium  Note:   Current Barriers:  [name] Knowledge Deficits related to diabetes disease process and Self Health management of DM  . Chronic Disease Management support and education needs related to DM related to type 2 with stage 3 CKD, Rheumatoid Arthritis, HTN, PAD, CAD  Nurse Case Manager Clinical Goal(s):  [name] Over the next 90 days, patient will work with the CCM team to address needs related to diabetes disease management and lowering her A1C CCM RN CM Interventions:  04/13/20 call completed with patient  . Evaluation of current treatment plan related to diabetes mellitus and patient's adherence to plan as established by provider. . Provided education to patient re: current A1C of 6.4; Educated on daily glycemic control FBS 80-130; after meals <180; Educated on dietary and exercise recommendations  . Reviewed medications with patient and assessed for adherence. Current regimen:  o  glimepiride (AMARYL) 1 MG tablet; 1/2 tab po M/W/F . Advised patient, providing education and rationale, to check cbg before meals daily and record, calling PCP and or CCM team for findings outside established parameters. . Discussed plans with patient for ongoing care management follow up and provided patient with direct contact information for care management team Patient Self Care Activities:  . Self administer medications as prescribed . Attend all scheduled provider appointments . Call pharmacy for medication refills . Call provider office for new concerns or questions . Continue to monitor CBG's as discussed . Continue to adhere to dietary and exercise recommendations     Next Follow Up Date: 07/20/20  Care Plan : Hypertension (Adult)  Updates made by Lynne Logan, RN since 04/16/2020 12:00 AM    Problem: Disease Progression (Hypertension)   Priority: Medium    Long-Range Goal: Disease Progression Prevented or Minimized   Start Date: 04/13/2020  Expected End Date: 10/11/2020   This Visit's Progress: On track  Priority: Medium  Note:   Current Barriers:  [name] Knowledge Deficits related to disease process and Self Health management for Hypertension . Chronic Disease Management support and education needs related to DM related to type 2 with stage 3 CKD, Rheumatoid Arthritis, HTN, PAD, CAD  Nurse Case Manager Clinical Goal(s):  [name] Over the next 60 days, patient will verbalize basic understanding of Hypertension  disease process and self health management plan as evidenced by patient will log her BP taken at home and will report consistent BP readings of 130/80 or lower CCM RN CM Interventions:  04/13/20 call completed with patient  . Evaluation of current treatment plan related to Hypertension and patient's adherence to plan as established by provider. . Provided education to patient re: the importance of keeping BP well controlled to help reduce the risk of potential cardiovascular complications and kidney disease; discussed importance of avoiding tobacco products, implementing exercise in her daily routine, getting enough sleep and maintaining body weight within recommended BMI; educated patient on importance of adhering to a low Sodium diet and eating a well balanced heart healthy diet; discussed patient has a BP cuff in her home and feels comfortable monitoring her BP as needed; patient encouraged to  monitor her BP routinely and to record her readings alerting the CCM team and or PCP of abnormal readings; discussed target BP 130/80  . Reviewed medications with patient and discussed patient's current pharmacological treatment plan; discussed medication indication, dosage and frequency; discussed patient has a good understanding about each of her medications and is satisfied with her HTN management; Current regimen:  o carvedilol (COREG) 12.5 MG tablet; TAKE 1 TABLET(12.5 MG) BY MOUTH TWICE DAILY WITH A MEAL   . Discussed plans with patient for ongoing care management follow up  and provided patient with direct contact information for care management team Patient Self Care Activities:  . Self administer medications as prescribed . Attend all scheduled provider appointments . Call pharmacy for medication refills . Call provider office for new concerns or questions . Continue to adhere to a low sodium diet as discussed . Continue to adhere to dietary and exercise recommendations . Continue to monitor BP at home and record readings, reporting abnormal readings to PCP as discussed  Next Follow Up Date: 07/20/20    Care Plan : Rheumatoid Arthritis  Updates made by Lynne Logan, RN since 04/16/2020 12:00 AM    Problem: Rheumatoid Arthritis worsened by PAD   Priority: High    Long-Range Goal: RA pain management optimized   Start Date: 04/13/2020  Expected End Date: 10/11/2020  This Visit's Progress: On track  Priority: High  Note:   Current Barriers:  [name] Knowledge Deficits related to disease process and Self Health management for PAD  . Chronic Disease Management support and education needs related to DM related to type 2 with stage 3 CKD, Rheumatoid Arthritis, HTN, PAD, CAD   . 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 Limited ROM secondary to having Arthritic pain  Nurse Case Manager Clinical Goal(s):  [name] Over the next 90 days, patient will maintain her ability to perform Self care and stay physically active CCM RN CM Interventions:  04/13/20 call completed with patient  . Evaluation of current treatment plan related to RA worsened by PAD and patient's adherence to plan as established by provider; discussed patient manages this hip pain by staying active within her home by stair climbing and moving around within her home while attending to her daily activities and house chores . Reviewed and discussed recent follow up with Cardiology. Assessment/Plan:  o Mostly limited by arthritis on right side of her body  o Worried about PAD but has good pedal pulses. Some arthritic right  groin / hip pain  o 1. CAD/CABG: CABG 2003 with patent grafts on cath 2012 no angina continue medical Rx o 2. Carotid:  Duplex 12/08/17  plaque no stenosis observe given age  o 36. HTN:.  Well controlled.  Continue current medications and low sodium Dash type diet.   o 4. DM:  Discussed low carb diet.  Target hemoglobin A1c is 6.5 or less.  Continue current medications. o 5. Chol: continue crestor LDL 11/07/19  o 6. Edema:  RLE from SVG harvest chronic Has diuretic continue hygroton o 7. CRF:  Cr around 1.9 do not escalate diuretic f/u nephrology  o Current medicines are reviewed at length with the patient today.  The patient does not have concerns regarding medicines. o The following changes have been made:  no change o Labs/ tests ordered today include:   None  o No orders of the defined types were placed in this encounter. o Disposition:   FU with me in a year   . Discussed plans  with patient for ongoing care management follow up and provided patient with direct contact information for care management team Patient Self Care Activities:  . Self administer medications as prescribed . Attend all scheduled provider appointments . Call pharmacy for medication refills . Call provider office for new concerns or questions . Continue to follow exercise recommendations and alert Rheumatologist of new or worsening symptoms Next Follow Up Date: 07/20/20      Plan:Telephone follow up appointment with care management team member scheduled for:  07/20/20   Barb Merino, RN, BSN, CCM Care Management Coordinator [facility] [facility]  Direct Phone: [phone]

## 2020-05-11 DIAGNOSIS — E1165 Type 2 diabetes mellitus with hyperglycemia: Secondary | ICD-10-CM | POA: Diagnosis not present

## 2020-06-25 DIAGNOSIS — N184 Chronic kidney disease, stage 4 (severe): Secondary | ICD-10-CM | POA: Diagnosis not present

## 2020-06-25 LAB — BASIC METABOLIC PANEL
BUN: 50 — AB (ref 4–21)
CO2: 28 — AB (ref 13–22)
Creatinine: 1.7 — AB (ref 0.5–1.1)
Glucose: 124
Potassium: 4.5 (ref 3.4–5.3)
Sodium: 139 (ref 137–147)

## 2020-06-25 LAB — COMPREHENSIVE METABOLIC PANEL
Albumin: 4.3 (ref 3.5–5.0)
Calcium: 9.8 (ref 8.7–10.7)
GFR calc Af Amer: 28

## 2020-06-25 LAB — CBC AND DIFFERENTIAL
HCT: 34 — AB (ref 36–46)
Hemoglobin: 11.3 — AB (ref 12.0–16.0)
Platelets: 207 (ref 150–399)
WBC: 4.9

## 2020-06-25 LAB — CBC: RBC: 3.62 — AB (ref 3.87–5.11)

## 2020-07-03 DIAGNOSIS — N184 Chronic kidney disease, stage 4 (severe): Secondary | ICD-10-CM | POA: Diagnosis not present

## 2020-07-03 DIAGNOSIS — I129 Hypertensive chronic kidney disease with stage 1 through stage 4 chronic kidney disease, or unspecified chronic kidney disease: Secondary | ICD-10-CM | POA: Diagnosis not present

## 2020-07-03 DIAGNOSIS — E785 Hyperlipidemia, unspecified: Secondary | ICD-10-CM | POA: Diagnosis not present

## 2020-07-03 DIAGNOSIS — N281 Cyst of kidney, acquired: Secondary | ICD-10-CM | POA: Diagnosis not present

## 2020-07-03 DIAGNOSIS — R778 Other specified abnormalities of plasma proteins: Secondary | ICD-10-CM | POA: Diagnosis not present

## 2020-07-03 DIAGNOSIS — N2581 Secondary hyperparathyroidism of renal origin: Secondary | ICD-10-CM | POA: Diagnosis not present

## 2020-07-03 DIAGNOSIS — D631 Anemia in chronic kidney disease: Secondary | ICD-10-CM | POA: Diagnosis not present

## 2020-07-06 ENCOUNTER — Telehealth: Payer: Medicare HMO

## 2020-07-12 ENCOUNTER — Encounter: Payer: Self-pay | Admitting: Internal Medicine

## 2020-07-19 ENCOUNTER — Ambulatory Visit: Payer: Medicare HMO | Admitting: Internal Medicine

## 2020-07-19 ENCOUNTER — Encounter: Payer: Self-pay | Admitting: Internal Medicine

## 2020-07-19 ENCOUNTER — Other Ambulatory Visit: Payer: Self-pay

## 2020-07-19 VITALS — BP 124/60 | HR 64 | Temp 98.3°F | Ht 60.0 in | Wt 136.0 lb

## 2020-07-19 DIAGNOSIS — I1310 Hypertensive heart and chronic kidney disease without heart failure, with stage 1 through stage 4 chronic kidney disease, or unspecified chronic kidney disease: Secondary | ICD-10-CM | POA: Diagnosis not present

## 2020-07-19 DIAGNOSIS — N184 Chronic kidney disease, stage 4 (severe): Secondary | ICD-10-CM | POA: Diagnosis not present

## 2020-07-19 DIAGNOSIS — I2510 Atherosclerotic heart disease of native coronary artery without angina pectoris: Secondary | ICD-10-CM

## 2020-07-19 DIAGNOSIS — E1122 Type 2 diabetes mellitus with diabetic chronic kidney disease: Secondary | ICD-10-CM

## 2020-07-19 DIAGNOSIS — M79604 Pain in right leg: Secondary | ICD-10-CM

## 2020-07-19 DIAGNOSIS — M069 Rheumatoid arthritis, unspecified: Secondary | ICD-10-CM | POA: Diagnosis not present

## 2020-07-19 DIAGNOSIS — N183 Chronic kidney disease, stage 3 unspecified: Secondary | ICD-10-CM

## 2020-07-19 DIAGNOSIS — L601 Onycholysis: Secondary | ICD-10-CM | POA: Diagnosis not present

## 2020-07-19 DIAGNOSIS — Z9989 Dependence on other enabling machines and devices: Secondary | ICD-10-CM | POA: Diagnosis not present

## 2020-07-19 MED ORDER — ASPIRIN-DIPYRIDAMOLE ER 25-200 MG PO CP12: 1.0000 | ORAL_CAPSULE | Freq: Every day | ORAL | 1 refills | Status: DC

## 2020-07-19 MED ORDER — CARVEDILOL 12.5 MG PO TABS: ORAL_TABLET | ORAL | 1 refills | Status: DC

## 2020-07-19 MED ORDER — AMLODIPINE BESYLATE 5 MG PO TABS
ORAL_TABLET | ORAL | 2 refills | Status: DC
Start: 2020-07-19 — End: 2021-04-24

## 2020-07-19 MED ORDER — IRBESARTAN 150 MG PO TABS: 150.0000 mg | ORAL_TABLET | Freq: Every day | ORAL | 2 refills | Status: DC

## 2020-07-19 NOTE — Progress Notes (Signed)
I,Katawbba Wiggins,acting as a Education administrator for [name], MD.,have documented all relevant documentation on the behalf of [name], MD,as directed by  [name], MD while in the presence of [name], MD.  This visit occurred during the SARS-CoV-2 public health emergency.  Safety protocols were in place, including screening questions prior to the visit, additional usage of staff PPE, and extensive cleaning of exam room while observing appropriate contact time as indicated for disinfecting solutions.  Subjective:     Patient ID: [place] , female    DOB: Jun 24, 1927 , 87 y.o.   MRN: [id number]   Chief Complaint  Patient presents with   Diabetes   Hypertension    HPI  She presents today for DM/HTN check. She reports compliance with meds. She denies headaches, chest pain and shortness of breath.   Diabetes She presents for her follow-up diabetic visit. She has type 2 diabetes mellitus. Her disease course has been stable. There are no hypoglycemic associated symptoms. Pertinent negatives for hypoglycemia include no dizziness or headaches. Pertinent negatives for diabetes include no blurred vision, no fatigue, no polydipsia, no polyphagia and no polyuria. There are no hypoglycemic complications. Risk factors for coronary artery disease include dyslipidemia, hypertension, post-menopausal and diabetes mellitus. Current diabetic treatment includes oral agent (monotherapy).  Hypertension This is a chronic problem. The current episode started more than 1 year ago. The problem has been gradually improving since onset. The problem is controlled. Pertinent negatives include no blurred vision or headaches.    Past Medical History:  Diagnosis Date   Cerebrovascular disease, unspecified    Coronary atherosclerosis of unspecified type of vessel, native or graft    Diabetes mellitus without complication ([place])    Other and unspecified hyperlipidemia    Rheumatoid arthritis(714.0)     Shingles 2003   Unspecified essential hypertension      Family History  Problem Relation Age of Onset   Cancer Mother    Hypertension Father    Cancer Brother    Hypertension Paternal Grandmother    Heart disease Brother    Diabetes Neg Hx    Coronary artery disease Neg Hx      Current Outpatient Medications:    Accu-Chek Softclix Lancets lancets, Use as instructed to check blood sugars up to 3 times a day  Dx code e11.65, Disp: 100 each, Rfl: 12   acetaminophen (TYLENOL) 650 MG CR tablet, Take 650 mg by mouth every 8 (eight) hours as needed for pain., Disp: , Rfl:    Blood Glucose Calibration (ACCU-CHEK AVIVA) SOLN, Use with accu-check aviva machine, Disp: 1 each, Rfl: 3   Blood Glucose Monitoring Suppl (ACCU-CHEK AVIVA PLUS) w/Device KIT, Use to check  Blood sugars up to 3 times a day.  Dx code e11.65, Disp: 1 kit, Rfl: 3   chlorthalidone (HYGROTON) 25 MG tablet, Take 12.5 mg by mouth daily., Disp: , Rfl:    Cholecalciferol (VITAMIN D) 50 MCG (2000 UT) CAPS, Take by mouth., Disp: , Rfl:    glimepiride (AMARYL) 1 MG tablet, 1/2 tab po M/W/F, Disp: 36 tablet, Rfl: 1   glucosamine-chondroitin 500-400 MG tablet, Take 1 tablet by mouth 3 (three) times daily., Disp: , Rfl:    glucose blood test strip, Use as instructed to check blood sugars up to 3 times a day.  Dx code e11.65, Disp: 100 each, Rfl: 12   Multiple Vitamin (MULTIVITAMIN) capsule, Take 1 capsule by mouth daily., Disp: , Rfl:    nitroGLYCERIN (NITROSTAT)  0.4 MG SL tablet, PLACE 1 TABLET UNDER THE TONGUE AT THE 1ST SIGN OF ATTACK, MAY REPEAT EVERY 5 MINUTES FOR 3 DOSES IN 15 MINUTES, IF PAIN PERSISTS, CALL 911, Disp: 50 tablet, Rfl: 0   rosuvastatin (CRESTOR) 5 MG tablet, One tab po M- F, skip Sat/Sun, Disp: 90 tablet, Rfl: 1   amLODipine (NORVASC) 5 MG tablet, TAKE 1 TABLET(5 MG) BY MOUTH DAILY, Disp: 90 tablet, Rfl: 2   carvedilol (COREG) 12.5 MG tablet, TAKE 1 TABLET(12.5 MG) BY MOUTH TWICE DAILY WITH A MEAL, Disp: 180  tablet, Rfl: 1   dipyridamole-aspirin (AGGRENOX) 200-25 MG 12hr capsule, Take 1 capsule by mouth daily., Disp: 90 capsule, Rfl: 1   irbesartan (AVAPRO) 150 MG tablet, Take 1 tablet (150 mg total) by mouth daily., Disp: 90 tablet, Rfl: 2   No Known Allergies   Review of Systems  Constitutional: Negative.  Negative for fatigue.  Eyes:  Negative for blurred vision.  Respiratory: Negative.    Cardiovascular: Negative.   Gastrointestinal: Negative.   Endocrine: Negative for polydipsia, polyphagia and polyuria.  Musculoskeletal:        She c/o r leg pain with ambulation. Denies fall/trauma. Discomfort is behind her leg. Relieved with rest.   Neurological:  Negative for dizziness and headaches.  Psychiatric/Behavioral: Negative.    All other systems reviewed and are negative.   Today's Vitals   07/19/20 1013  BP: 124/60  Pulse: 64  Temp: 98.3 F (36.8 C)  TempSrc: Oral  Weight: 136 lb (61.7 kg)  Height: 5' (1.524 m)  PainSc: 0-No pain   Body mass index is 26.56 kg/m.  Wt Readings from Last 3 Encounters:  07/19/20 136 lb (61.7 kg)  03/08/20 142 lb 9.6 oz (64.7 kg)  03/08/20 142 lb (64.4 kg)   BP Readings from Last 3 Encounters:  07/19/20 124/60  03/08/20 (!) 150/60  03/08/20 (!) 150/60   Objective:  Physical Exam Vitals and nursing note reviewed.  Constitutional:      Appearance: Normal appearance.  HENT:     Head: Normocephalic and atraumatic.  Cardiovascular:     Rate and Rhythm: Normal rate and regular rhythm.     Heart sounds: Normal heart sounds.  Pulmonary:     Effort: Pulmonary effort is normal.     Breath sounds: Normal breath sounds.  Musculoskeletal:     Comments: Ambulatory with walker   Skin:    General: Skin is warm.     Comments: Right middle fingernail is lifting up. Melanonychia is present  Neurological:     General: No focal deficit present.     Mental Status: She is alert.  Psychiatric:        Mood and Affect: Mood normal.        Behavior:  Behavior normal.        Assessment And Plan:     1. Type 2 diabetes mellitus with stage 4 chronic kidney disease, without long-term current use of insulin ([facility]) Comments: Chronic, I will check labs as listed below. Encouraged to limit her intake of sweetened beverages.  - Hemoglobin A1c - Lipid panel - TSH  2. Hypertensive heart and renal disease with renal failure, stage 1 through stage 4 or unspecified chronic kidney disease, without heart failure Comments: Chronic, well controlled. She is encouraged to limit her salt intake.  - Lipid panel - carvedilol (COREG) 12.5 MG tablet; TAKE 1 TABLET(12.5 MG) BY MOUTH TWICE DAILY WITH A MEAL  Dispense: 180 tablet; Refill: 1 - irbesartan (AVAPRO)  150 MG tablet; Take 1 tablet (150 mg total) by mouth daily.  Dispense: 90 tablet; Refill: 2  3. Atherosclerosis of native coronary artery of native heart without angina pectoris Comments: Chronic, encouraged to follow heart healthy lifestyle. She is advised to comply with her statin regimen as well.  - dipyridamole-aspirin (AGGRENOX) 200-25 MG 12hr capsule; Take 1 capsule by mouth daily.  Dispense: 90 capsule; Refill: 1  4. Rheumatoid arthritis, involving unspecified site, unspecified whether rheumatoid factor present ([place]) Comments: Chronic, encouraged to follow anti-inflammatory diet free of processed foods.   5. Right leg pain Comments: I will refer her to Vascular for ABI.  - VAS [place] ABI WITH/WO TBI; Future  6. Onycholysis Comments: I will refer her to Derm for further evaluation/removal of nail.  - Ambulatory referral to Dermatology   Patient was given opportunity to ask questions. Patient verbalized understanding of the plan and was able to repeat key elements of the plan. All questions were answered to their satisfaction.   I, [name], MD, have reviewed all documentation for this visit. The documentation on 07/26/20 for the exam, diagnosis, procedures, and orders are all accurate and  complete.   IF YOU HAVE BEEN REFERRED TO A SPECIALIST, IT MAY TAKE 1-2 WEEKS TO SCHEDULE/PROCESS THE REFERRAL. IF YOU HAVE NOT HEARD FROM US/SPECIALIST IN TWO WEEKS, PLEASE GIVE [place] A CALL AT [phone] X [phone].   THE PATIENT IS ENCOURAGED TO PRACTICE SOCIAL DISTANCING DUE TO THE COVID-19 PANDEMIC.

## 2020-07-19 NOTE — Patient Instructions (Signed)
Diabetes Mellitus and Foot Care Foot care is an important part of your health, especially when you have diabetes. Diabetes may cause you to have problems because of poor blood flow (circulation) to your feet and legs, which can cause your skin to:  Become thinner and drier.  Break more easily.  Heal more slowly.  Peel and crack. You may also have nerve damage (neuropathy) in your legs and feet, causing decreased feeling in them. This means that you may not notice minor injuries to your feet that could lead to more serious problems. Noticing and addressing any potential problems early is the best way to prevent future foot problems. How to care for your feet Foot hygiene  Wash your feet daily with warm water and mild soap. Do not use hot water. Then, pat your feet and the areas between your toes until they are completely dry. Do not soak your feet as this can dry your skin.  Trim your toenails straight across. Do not dig under them or around the cuticle. File the edges of your nails with an emery board or nail file.  Apply a moisturizing lotion or petroleum jelly to the skin on your feet and to dry, brittle toenails. Use lotion that does not contain alcohol and is unscented. Do not apply lotion between your toes.   Shoes and socks  Wear clean socks or stockings every day. Make sure they are not too tight. Do not wear knee-high stockings since they may decrease blood flow to your legs.  Wear shoes that fit properly and have enough cushioning. Always look in your shoes before you put them on to be sure there are no objects inside.  To break in new shoes, wear them for just a few hours a day. This prevents injuries on your feet. Wounds, scrapes, corns, and calluses  Check your feet daily for blisters, cuts, bruises, sores, and redness. If you cannot see the bottom of your feet, use a mirror or ask someone for help.  Do not cut corns or calluses or try to remove them with medicine.  If you  find a minor scrape, cut, or break in the skin on your feet, keep it and the skin around it clean and dry. You may clean these areas with mild soap and water. Do not clean the area with peroxide, alcohol, or iodine.  If you have a wound, scrape, corn, or callus on your foot, look at it several times a day to make sure it is healing and not infected. Check for: ? Redness, swelling, or pain. ? Fluid or blood. ? Warmth. ? Pus or a bad smell.   General tips  Do not cross your legs. This may decrease blood flow to your feet.  Do not use heating pads or hot water bottles on your feet. They may burn your skin. If you have lost feeling in your feet or legs, you may not know this is happening until it is too late.  Protect your feet from hot and cold by wearing shoes, such as at the beach or on hot pavement.  Schedule a complete foot exam at least once a year (annually) or more often if you have foot problems. Report any cuts, sores, or bruises to your health care provider immediately. Where to find more information  American Diabetes Association: www.diabetes.org  Association of Diabetes Care & Education Specialists: www.diabeteseducator.org Contact a health care provider if:  You have a medical condition that increases your risk of infection and   you have any cuts, sores, or bruises on your feet.  You have an injury that is not healing.  You have redness on your legs or feet.  You feel burning or tingling in your legs or feet.  You have pain or cramps in your legs and feet.  Your legs or feet are numb.  Your feet always feel cold.  You have pain around any toenails. Get help right away if:  You have a wound, scrape, corn, or callus on your foot and: ? You have pain, swelling, or redness that gets worse. ? You have fluid or blood coming from the wound, scrape, corn, or callus. ? Your wound, scrape, corn, or callus feels warm to the touch. ? You have pus or a bad smell coming from  the wound, scrape, corn, or callus. ? You have a fever. ? You have a red line going up your leg. Summary  Check your feet every day for blisters, cuts, bruises, sores, and redness.  Apply a moisturizing lotion or petroleum jelly to the skin on your feet and to dry, brittle toenails.  Wear shoes that fit properly and have enough cushioning.  If you have foot problems, report any cuts, sores, or bruises to your health care provider immediately.  Schedule a complete foot exam at least once a year (annually) or more often if you have foot problems. This information is not intended to replace advice given to you by your health care provider. Make sure you discuss any questions you have with your health care provider. Document Revised: 08/25/2019 Document Reviewed: 08/25/2019 Elsevier Patient Education  2021 Elsevier Inc.  

## 2020-07-20 ENCOUNTER — Telehealth: Payer: Medicare HMO

## 2020-07-20 LAB — LIPID PANEL
Chol/HDL Ratio: 4.2 ratio (ref 0.0–4.4)
Cholesterol, Total: 129 mg/dL (ref 100–199)
HDL: 31 mg/dL — ABNORMAL LOW (ref >39)
LDL Chol Calc (NIH): 76 mg/dL (ref 0–99)
Triglycerides: 120 mg/dL (ref 0–149)
VLDL Cholesterol Cal: 22 mg/dL (ref 5–40)

## 2020-07-20 LAB — HEMOGLOBIN A1C
Est. average glucose Bld gHb Est-mCnc: 146 mg/dL
Hgb A1c MFr Bld: 6.7 % — ABNORMAL HIGH (ref 4.8–5.6)

## 2020-07-20 LAB — TSH: TSH: 2.08 u[IU]/mL (ref 0.450–4.500)

## 2020-07-25 ENCOUNTER — Ambulatory Visit (HOSPITAL_COMMUNITY)
Admission: RE | Admit: 2020-07-25 | Discharge: 2020-07-25 | Disposition: A | Discharge status: Home / Self Care | Payer: Medicare HMO | Source: Ambulatory Visit | Attending: Internal Medicine | Admitting: Internal Medicine

## 2020-07-25 ENCOUNTER — Other Ambulatory Visit: Payer: Self-pay

## 2020-07-25 DIAGNOSIS — M79604 Pain in right leg: Secondary | ICD-10-CM | POA: Insufficient documentation

## 2020-07-26 DIAGNOSIS — M79604 Pain in right leg: Secondary | ICD-10-CM | POA: Insufficient documentation

## 2020-07-26 DIAGNOSIS — L603 Nail dystrophy: Secondary | ICD-10-CM | POA: Insufficient documentation

## 2020-07-30 DIAGNOSIS — E1165 Type 2 diabetes mellitus with hyperglycemia: Secondary | ICD-10-CM | POA: Diagnosis not present

## 2020-08-03 ENCOUNTER — Other Ambulatory Visit: Payer: Self-pay | Admitting: Internal Medicine

## 2020-08-03 ENCOUNTER — Telehealth: Payer: Self-pay

## 2020-08-03 DIAGNOSIS — I739 Peripheral vascular disease, unspecified: Secondary | ICD-10-CM

## 2020-08-03 NOTE — Telephone Encounter (Signed)
The pt said yes to seeing a specialist.

## 2020-08-03 NOTE — Telephone Encounter (Signed)
-----   Message from Glendale Chard, MD sent at 07/29/2020  4:33 PM EDT ----- ABI results: abnormal on the right. Is she willing to see vascular specialist?

## 2020-08-24 ENCOUNTER — Other Ambulatory Visit: Payer: Self-pay | Admitting: Internal Medicine

## 2020-08-24 DIAGNOSIS — E1122 Type 2 diabetes mellitus with diabetic chronic kidney disease: Secondary | ICD-10-CM

## 2020-08-24 DIAGNOSIS — N183 Chronic kidney disease, stage 3 unspecified: Secondary | ICD-10-CM

## 2020-08-26 NOTE — Progress Notes (Signed)
[facility] [place]  ASSESSMENT / PLAN: [name] is a 87 y.o. female with right lower extremity swelling and episodic pain. The pain is not typical of peripheral arterial disease.  She has no palpable pedal pulses, but good flow to her feet by noninvasive testing today.  Her right lower extremity swelling and discomfort is likely related to autonomic dysregulation after her stroke.  I encouraged her to continue compression garments and walk as much as she can.  She can follow-up with me as needed  CHIEF COMPLAINT: Right leg pain  HISTORY OF PRESENT ILLNESS: [name] is a 87 y.o. female referred to clinic for evaluation of right lower extremity pain.  The patient suffered a stroke as ago and has had trouble with her right lower extremity since.  She reports problems with swelling which worsened over the course of the day.  She has been wearing compression garments with symptomatic relief.  She also describes a sensation of cold in her right lower extremity.  She does not have symptoms typical of intermittent claudication, but I do not think she walks far or fast enough to claudicate.  She does not have symptoms typical of ischemic rest pain.  She does not have any ulceration about her foot.  Past Medical History:  Diagnosis Date   Cerebrovascular disease, unspecified    Coronary atherosclerosis of unspecified type of vessel, native or graft    Diabetes mellitus without complication ([place])    Other and unspecified hyperlipidemia    Rheumatoid arthritis(714.0)    Shingles 2003   Unspecified essential hypertension     Past Surgical History:  Procedure Laterality Date   Cardiac Bypass  2003   CATARACT EXTRACTION, BILATERAL  2007    Family History  Problem Relation Age of Onset   Cancer Mother    Hypertension Father    Cancer Brother    Hypertension Paternal Grandmother    Heart disease Brother    Diabetes Neg Hx    Coronary artery disease Neg Hx      Social History   Socioeconomic History   Marital status: Widowed    Spouse name: Not on file   Number of children: Not on file   Years of education: Not on file   Highest education level: Not on file  Occupational History   Occupation: retired  Tobacco Use   Smoking status: Never   Smokeless tobacco: Never  Vaping Use   Vaping Use: Never used  Substance and Sexual Activity   Alcohol use: No   Drug use: No   Sexual activity: Not Currently  Other Topics Concern   Not on file  Social History Narrative   Retired   Tobacco use- no   Alcohol use-no         Social Determinants of Radio broadcast assistant Strain: Low Risk    Difficulty of Paying Living Expenses: Not hard at all  Food Insecurity: No Food Insecurity   Worried About Charity fundraiser in the Last Year: Never true   Arboriculturist in the Last Year: Never true  Transportation Needs: No Transportation Needs   Lack of Transportation (Medical): No   Lack of Transportation (Non-Medical): No  Physical Activity: Inactive   Days of Exercise per Week: 0 days   Minutes of Exercise per Session: 0 min  Stress: No Stress Concern Present   Feeling of Stress : Not at all  Social Connections: Not on file  Intimate Partner  Violence: Not on file    No Known Allergies  Current Outpatient Medications  Medication Sig Dispense Refill   Accu-Chek Softclix Lancets lancets Use as instructed to check blood sugars up to 3 times a day  Dx code e11.65 100 each 12   acetaminophen (TYLENOL) 650 MG CR tablet Take 650 mg by mouth every 8 (eight) hours as needed for pain.     amLODipine (NORVASC) 5 MG tablet TAKE 1 TABLET(5 MG) BY MOUTH DAILY 90 tablet 2   Blood Glucose Calibration (ACCU-CHEK AVIVA) SOLN Use with accu-check aviva machine 1 each 3   Blood Glucose Monitoring Suppl (ACCU-CHEK AVIVA PLUS) w/Device KIT Use to check  Blood sugars up to 3 times a day.  Dx code e11.65 1 kit 3   carvedilol (COREG) 12.5 MG tablet TAKE  1 TABLET(12.5 MG) BY MOUTH TWICE DAILY WITH A MEAL 180 tablet 1   chlorthalidone (HYGROTON) 25 MG tablet Take 12.5 mg by mouth daily.     Cholecalciferol (VITAMIN D) 50 MCG (2000 UT) CAPS Take by mouth.     dipyridamole-aspirin (AGGRENOX) 200-25 MG 12hr capsule Take 1 capsule by mouth daily. 90 capsule 1   glimepiride (AMARYL) 1 MG tablet TAKE 1/2 TABLET BY MOUTH ON MONDAYS, WEDNESDAYS, AND FRIDAYS 36 tablet 1   glucosamine-chondroitin 500-400 MG tablet Take 1 tablet by mouth 3 (three) times daily.     glucose blood test strip Use as instructed to check blood sugars up to 3 times a day.  Dx code e11.65 100 each 12   irbesartan (AVAPRO) 150 MG tablet Take 1 tablet (150 mg total) by mouth daily. 90 tablet 2   Multiple Vitamin (MULTIVITAMIN) capsule Take 1 capsule by mouth daily.     nitroGLYCERIN (NITROSTAT) 0.4 MG SL tablet PLACE 1 TABLET UNDER THE TONGUE AT THE FIRST SIGN OF ATTACK, MAY REPEAT EVERY 5 MINUTES FOR 3 DOSES IN 15 MINUTES. IF PAIN PERSISTS CALL 911 50 tablet 0   rosuvastatin (CRESTOR) 5 MG tablet One tab po M- F, skip Sat/Sun 90 tablet 1   No current facility-administered medications for this visit.    REVIEW OF SYSTEMS:  '[X]'  denotes positive finding, '[ ]'  denotes negative finding Cardiac  Comments:  Chest pain or chest pressure:    Shortness of breath upon exertion:    Short of breath when lying flat:    Irregular heart rhythm:        Vascular    Pain in calf, thigh, or hip brought on by ambulation:    Pain in feet at night that wakes you up from your sleep:     Blood clot in your veins:    Leg swelling:         Pulmonary    Oxygen at home:    Productive cough:     Wheezing:         Neurologic    Sudden weakness in arms or legs:     Sudden numbness in arms or legs:     Sudden onset of difficulty speaking or slurred speech:    Temporary loss of vision in one eye:     Problems with dizziness:         Gastrointestinal    Blood in stool:     Vomited blood:          Genitourinary    Burning when urinating:     Blood in urine:        Psychiatric    Major depression:  Hematologic    Bleeding problems:    Problems with blood clotting too easily:        Skin    Rashes or ulcers:        Constitutional    Fever or chills:      PHYSICAL EXAM Vitals:   08/28/20 1015  BP: (!) 168/70  Pulse: 66  Resp: 16  Temp: 97.6 F (36.4 C)  TempSrc: Temporal  SpO2: 100%  Weight: 136 lb 8 oz (61.9 kg)  Height: '5\' 4"'  (1.626 m)    Constitutional: Elderly, but well appearing. no distress. Appears well nourished.  Neurologic: CN intact. no focal findings. no sensory loss. Psychiatric:  Mood and affect symmetric and appropriate. Eyes:  No icterus. No conjunctival pallor. Ears, nose, throat:  mucous membranes moist. Midline trachea.  Cardiac: regular rate and rhythm.  Respiratory:  unlabored. Abdominal:  soft, non-tender, non-distended.  Peripheral vascular: no palpable pedal pulses. <2s capillary refill. Extremity: trace to  1+ RLE pitting edema. no cyanosis. no pallor.  Skin: no gangrene. no ulceration.  Lymphatic: no Stemmer's sign. no palpable lymphadenopathy.  PERTINENT LABORATORY AND RADIOLOGIC DATA  Most recent CBC CBC Latest Ref Rng & Units 06/25/2020 03/08/2020 11/07/2019  WBC - 4.9 4.7 4.2  Hemoglobin 12.0 - 16.0 11.3(A) 10.7(L) 10.7(L)  Hematocrit 36 - 46 34(A) 31.5(L) 31.9(L)  Platelets 150 - 399 207 198 191     Most recent CMP CMP Latest Ref Rng & Units 06/25/2020 03/08/2020 11/07/2019  Glucose 65 - 99 mg/dL - 99 90  BUN 4 - 21 50(A) 56(H) 41(H)  Creatinine 0.5 - 1.1 1.7(A) 1.99(H) 1.79(H)  Sodium 137 - 147 139 141 142  Potassium 3.4 - 5.3 4.5 4.8 4.2  Chloride 96 - 106 mmol/L - 104 103  CO2 13 - 22 28(A) 26 23  Calcium 8.7 - 10.7 9.8 9.5 9.7  Total Protein 6.0 - 8.5 g/dL - 6.9 7.2  Total Bilirubin 0.0 - 1.2 mg/dL - - 0.3  Alkaline Phos 44 - 121 IU/L - - 78  AST 0 - 40 IU/L - - 17  ALT 0 - 32 IU/L - - 9    Renal  function CrCl cannot be calculated (Patient's most recent lab result is older than the maximum 21 days allowed.).  Hgb A1c MFr Bld (%)  Date Value  07/19/2020 6.7 (H)    LDL Chol Calc (NIH)  Date Value Ref Range Status  07/19/2020 76 0 - 99 mg/dL Final    ABI 07/25/20 R 1.4 ([place]); TP 92; monophasic waveforms L 1.1 ([place]); TP 135; biphasic waveworms  Dondra Rhett N. Stanford Breed, MD [facility] Phone Number: [phone] 08/26/2020 2:21 PM

## 2020-08-28 ENCOUNTER — Other Ambulatory Visit: Payer: Self-pay

## 2020-08-28 ENCOUNTER — Ambulatory Visit: Payer: Medicare HMO | Admitting: Vascular Surgery

## 2020-08-28 ENCOUNTER — Ambulatory Visit (INDEPENDENT_AMBULATORY_CARE_PROVIDER_SITE_OTHER): Payer: Medicare HMO

## 2020-08-28 ENCOUNTER — Telehealth: Payer: Medicare HMO

## 2020-08-28 ENCOUNTER — Encounter: Payer: Self-pay | Admitting: Vascular Surgery

## 2020-08-28 VITALS — BP 168/70 | HR 66 | Temp 97.6°F | Resp 16 | Ht 64.0 in | Wt 136.5 lb

## 2020-08-28 DIAGNOSIS — M069 Rheumatoid arthritis, unspecified: Secondary | ICD-10-CM

## 2020-08-28 DIAGNOSIS — N184 Chronic kidney disease, stage 4 (severe): Secondary | ICD-10-CM

## 2020-08-28 DIAGNOSIS — I2510 Atherosclerotic heart disease of native coronary artery without angina pectoris: Secondary | ICD-10-CM

## 2020-08-28 DIAGNOSIS — I739 Peripheral vascular disease, unspecified: Secondary | ICD-10-CM | POA: Diagnosis not present

## 2020-08-28 DIAGNOSIS — E1122 Type 2 diabetes mellitus with diabetic chronic kidney disease: Secondary | ICD-10-CM

## 2020-08-28 DIAGNOSIS — I1310 Hypertensive heart and chronic kidney disease without heart failure, with stage 1 through stage 4 chronic kidney disease, or unspecified chronic kidney disease: Secondary | ICD-10-CM

## 2020-08-30 ENCOUNTER — Telehealth: Payer: Medicare HMO

## 2020-08-31 NOTE — Patient Instructions (Signed)
Goals      Diabetes Management - Disease Progression Prevented or Minimized     Timeframe:  Long-Range Goal Priority:  Medium Start Date: 04/13/20                            Expected End Date:  04/13/21  Next Follow Up Date:  11/19/20      Patient Goals/Self-Care Activities:  Self administer medications as prescribed Attend all scheduled provider appointments Call pharmacy for medication refills Call provider office for new concerns or questions Continue to monitor CBG's as discussed Continue to adhere to dietary and exercise recommendations                    Rheumatoid Arthritis - Disease Progression Prevented or Minimized     Timeframe:  Long-Range Goal Priority:  High Start Date: 04/13/20                             Expected End Date: 04/13/21  Next Follow Up Date: 11/19/20     Patient Goals/Self-Care Activities:  Self administer medications as prescribed Attend all scheduled provider appointments Call pharmacy for medication refills Call provider office for new concerns or questions Continue to follow exercise recommendations and alert Rheumatologist of new or worsening symptoms                  Track and Manage My Blood Pressure-Hypertension     Timeframe:  Long-Range Goal Priority:  Medium Start Date: 04/13/20                           Expected End Date:  04/13/21                     Follow Up Date: 11/19/20   Attend all scheduled provider appointments Call pharmacy for medication refills Call provider office for new concerns or questions Continue to adhere to a low sodium diet as discussed Continue to adhere to dietary and exercise recommendations Continue to monitor BP at home and record readings, reporting abnormal readings to PCP as discussed    Why is this important?   You won't feel high blood pressure, but it can still hurt your blood vessels.  High blood pressure can cause heart or kidney problems. It can also cause a stroke.  Making lifestyle changes like  losing a [place] weight or eating less salt will help.  Checking your blood pressure at home and at different times of the day can help to control blood pressure.  If the doctor prescribes medicine remember to take it the way the doctor ordered.  Call the office if you cannot afford the medicine or if there are questions about it.     Notes:

## 2020-08-31 NOTE — Chronic Care Management (AMB) (Addendum)
Chronic Care Management   CCM RN Visit Note  08/30/2020 Name: [name] MRN: [id number] DOB: 06-Aug-1927  Subjective: [name] is a 87 y.o. year old female who is a primary care patient of Glendale Chard, MD. The care management team was consulted for assistance with disease management and care coordination needs.    Engaged with patient by telephone for follow up visit in response to provider referral for case management and/or care coordination services.   Consent to Services:  The patient was given information about Chronic Care Management services, agreed to services, and gave verbal consent prior to initiation of services.  Please see initial visit note for detailed documentation.   Patient agreed to services and verbal consent obtained.   Assessment: Review of patient past medical history, allergies, medications, health status, including review of consultants reports, laboratory and other test data, was performed as part of comprehensive evaluation and provision of chronic care management services.   SDOH (Social Determinants of Health) assessments and interventions performed:  Yes, no acute needs identified   CCM Care Plan  No Known Allergies  Outpatient Encounter Medications as of 08/28/2020  Medication Sig   Accu-Chek Softclix Lancets lancets Use as instructed to check blood sugars up to 3 times a day  Dx code e11.65   acetaminophen (TYLENOL) 650 MG CR tablet Take 650 mg by mouth every 8 (eight) hours as needed for pain.   amLODipine (NORVASC) 5 MG tablet TAKE 1 TABLET(5 MG) BY MOUTH DAILY   Blood Glucose Calibration (ACCU-CHEK AVIVA) SOLN Use with accu-check aviva machine   Blood Glucose Monitoring Suppl (ACCU-CHEK AVIVA PLUS) w/Device KIT Use to check  Blood sugars up to 3 times a day.  Dx code e11.65   carvedilol (COREG) 12.5 MG tablet TAKE 1 TABLET(12.5 MG) BY MOUTH TWICE DAILY WITH A MEAL   chlorthalidone (HYGROTON) 25 MG tablet Take 12.5 mg by mouth  daily.   Cholecalciferol (VITAMIN D) 50 MCG (2000 UT) CAPS Take by mouth.   dipyridamole-aspirin (AGGRENOX) 200-25 MG 12hr capsule Take 1 capsule by mouth daily.   glimepiride (AMARYL) 1 MG tablet TAKE 1/2 TABLET BY MOUTH ON MONDAYS, WEDNESDAYS, AND FRIDAYS   glucosamine-chondroitin 500-400 MG tablet Take 1 tablet by mouth 3 (three) times daily.   glucose blood test strip Use as instructed to check blood sugars up to 3 times a day.  Dx code e11.65   irbesartan (AVAPRO) 150 MG tablet Take 1 tablet (150 mg total) by mouth daily.   Multiple Vitamin (MULTIVITAMIN) capsule Take 1 capsule by mouth daily.   nitroGLYCERIN (NITROSTAT) 0.4 MG SL tablet PLACE 1 TABLET UNDER THE TONGUE AT THE FIRST SIGN OF ATTACK, MAY REPEAT EVERY 5 MINUTES FOR 3 DOSES IN 15 MINUTES. IF PAIN PERSISTS CALL 911   rosuvastatin (CRESTOR) 5 MG tablet One tab po M- F, skip Sat/Sun   No facility-administered encounter medications on file as of 08/28/2020.    Patient Active Problem List   Diagnosis Date Noted   Right leg pain 07/26/2020   Onycholysis 07/26/2020   CKD (chronic kidney disease) stage 4, GFR 15-29 ml/min ([name]) 07/19/2020   Hypertensive heart and renal disease 03/22/2020   Gout 12/07/2017   Type 2 diabetes mellitus with stage 3 chronic kidney disease, without long-term current use of insulin ([name]) 02/11/2014   History of coronary artery bypass graft 07/01/2010   MYOCARDIAL INFARCTION, INFERIOR WALL, SUBSEQUENT CARE 05/12/2008   CAD, NATIVE VESSEL 05/12/2008   HYPERLIPIDEMIA-MIXED 05/11/2008   Essential hypertension 05/11/2008  CAD, UNSPECIFIED SITE 05/11/2008   CEREBROVASCULAR DISEASE 05/11/2008   Rheumatoid arthritis ([name]) 05/11/2008    Conditions to be addressed/monitored: DM related to type 2 with stage 3 CKD, Rheumatoid Arthritis, HTN, PAD, CAD   Care Plan : Diabetes Type 2 (Adult)  Updates made by Lynne Logan, RN since 08/30/2020 12:00 AM     Problem: Disease Progression (Diabetes, Type 2)    Priority: Medium     Long-Range Goal: Disease Progression Prevented or Minimized   Start Date: 04/13/2020  Expected End Date: 04/12/2021  Recent Progress: On track  Priority: Medium  Note:   Objective:  Lab Results  Component Value Date   HGBA1C 6.7 (H) 07/19/2020   Lab Results  Component Value Date   CREATININE 1.7 (A) 06/25/2020   CREATININE 1.99 (H) 03/08/2020   CREATININE 1.79 (H) 11/07/2019   No results found for: EGFR Objective:  Lab Results  Component Value Date   HGBA1C 6.7 (H) 07/19/2020   Lab Results  Component Value Date   CREATININE 1.7 (A) 06/25/2020   CREATININE 1.99 (H) 03/08/2020   CREATININE 1.79 (H) 11/07/2019   No results found for: EGFR Current Barriers:  Knowledge Deficits related to basic Diabetes pathophysiology and self care/management Knowledge Deficits related to medications used for management of diabetes Case Manager Clinical Goal(s):  patient will demonstrate improved adherence to prescribed treatment plan for diabetes self care/management as evidenced by: adherence to ADA/ carb modified diet exercise 5 days/week adherence to prescribed medication regimen contacting provider for new or worsened symptoms or questions Interventions:  08/30/20 completed successful outbound call with patient  Collaboration with Glendale Chard, MD regarding development and update of comprehensive plan of care as evidenced by provider attestation and co-signature Inter-disciplinary care team collaboration (see longitudinal plan of care) Provided education to patient about basic DM disease process Review of patient status, including review of consultant's reports, relevant laboratory and other test results, and medications completed. Reviewed medications with patient and discussed importance of medication adherence Educated patient on dietary and exercise recommendations; daily glycemic control FBS 80-130, <180 after meals;15'15' rule Advised patient, providing  education and rationale, to check cbg daily before meals and at bedtime and record, calling the CCM team and or PCP for findings outside established parameters Mailed printed educational material related to Low Carb High Protein Smoothies  Discussed plans with patient for ongoing care management follow up and provided patient with direct contact information for care management team Self-Care Activities Attends all scheduled provider appointments Checks blood sugars as prescribed and utilize hyper and hypoglycemia protocol as needed Adheres to prescribed ADA/carb modified Patient Goals: - manage portion size  Follow Up Plan: Telephone follow up appointment with care management team member scheduled for: 11/19/20    Care Plan : Hypertension (Adult)  Updates made by Lynne Logan, RN since 08/30/2020 12:00 AM     Problem: Disease Progression (Hypertension)   Priority: Medium     Long-Range Goal: Disease Progression Prevented or Minimized   Start Date: 04/13/2020  Expected End Date: 04/12/2021  Recent Progress: On track  Priority: Medium  Note:   Objective:  Last practice recorded BP readings:  BP Readings from Last 3 Encounters:  08/28/20 (!) 168/70  07/19/20 124/60  03/08/20 (!) 150/60   Most recent eGFR/CrCl: No results found for: EGFR  No components found for: CRCL Current Barriers:  Knowledge Deficits related to basic understanding of hypertension pathophysiology and self care management Knowledge Deficits related to understanding of medications prescribed for  management of hypertension Case Manager Clinical Goal(s):  patient will demonstrate improved adherence to prescribed treatment plan for hypertension as evidenced by taking all medications as prescribed, monitoring and recording blood pressure as directed, adhering to low sodium/DASH diet Interventions:  08/30/20 completed successful outbound call to patient  Collaboration with Glendale Chard, MD regarding development and  update of comprehensive plan of care as evidenced by provider attestation and co-signature Inter-disciplinary care team collaboration (see longitudinal plan of care) Evaluation of current treatment plan related to hypertension self management and patient's adherence to plan as established by provider. Provided education to patient re: stroke prevention, s/s of heart attack and stroke, DASH diet, complications of uncontrolled blood pressure Reviewed medications with patient and discussed importance of compliance Advised patient, providing education and rationale, to monitor blood pressure daily and record, calling PCP for findings outside established parameters.  Discussed plans with patient for ongoing care management follow up and provided patient with direct contact information for care management team Self-Care Activities: Self administers medications as prescribed Attends all scheduled provider appointments Calls provider office for new concerns, questions, or BP outside discussed parameters Checks BP and records as discussed Follows a low sodium diet/DASH diet Patient Goals: - check blood pressure 3 times per week - choose a place to take my blood pressure (home, clinic or office, retail store) - write blood pressure results in a log or diary - learn about high blood pressure  Follow Up Plan: Telephone follow up appointment with care management team member scheduled for: 11/19/20    Care Plan : Rheumatoid Arthritis  Updates made by Lynne Logan, RN since 08/30/2020 12:00 AM     Problem: Rheumatoid Arthritis worsened by PAD   Priority: High     Long-Range Goal: RA pain management optimized   Start Date: 04/13/2020  Expected End Date: 04/12/2021  Recent Progress: On track  Priority: High  Note:   Current Barriers:  Knowledge Deficits related to disease process and Self Health management for PAD  Chronic Disease Management support and education needs related to DM related to  type 2 with stage 3 CKD, Rheumatoid Arthritis, HTN, PAD, CAD   Arthritis to bilateral hips Limited ROM secondary to having Arthritic pain  Nurse Case Manager Clinical Goal(s):  Patient will maintain her ability to perform Self care and stay physically active CCM RN CM Interventions:  08/30/20 call completed with patient  Evaluation of current treatment plan related to RA worsened by PAD and patient's adherence to plan as established by provider; discussed patient manages this hip pain by staying active within her home by stair climbing and moving around within her home while attending to her daily activities and house chores Reviewed and discussed recent follow up with [facility]. Assessment/Plan:  [facility] ASSESSMENT / PLAN: Danashia Sahr is a 87 y.o. female with right lower extremity swelling and episodic pain. The pain is not typical of peripheral arterial disease.  She has no palpable pedal pulses, but good flow to her feet by noninvasive testing today.  Her right lower extremity swelling and discomfort is likely related to autonomic dysregulation after her stroke.  I encouraged her to continue compression garments and walk as much as she can.  She can follow-up with me as needed CHIEF COMPLAINT: Right leg pain HISTORY OF PRESENT ILLNESS: Margarethe Mckown is a 87 y.o. female referred to clinic for evaluation of right lower extremity pain.  The patient suffered a stroke as ago and has had  trouble with her right lower extremity since.  She reports problems with swelling which worsened over the course of the day.  She has been wearing compression garments with symptomatic relief.  She also describes a sensation of cold in her right lower extremity.  She does not have symptoms typical of intermittent claudication, but I do not think she walks far or fast enough to claudicate.  She does not have symptoms typical of ischemic rest pain.  She does not have any ulceration  about her foot. Discussed plans with patient for ongoing care management follow up and provided patient with direct contact information for care management team Patient Self Care Activities:  Self administer medications as prescribed Attend all scheduled provider appointments Call pharmacy for medication refills Call provider office for new concerns or questions Continue to follow exercise recommendations and alert Rheumatologist of new or worsening symptoms   Follow Up Plan: Telephone follow up appointment with care management team member scheduled for: 11/19/20     Plan:Telephone follow up appointment with care management team member scheduled for:  11/19/20  Barb Merino, RN, BSN, CCM Care Management Coordinator [facility]  Direct Phone: [phone]

## 2020-08-31 NOTE — Progress Notes (Signed)
This encounter was created in error - please disregard.

## 2020-09-03 ENCOUNTER — Ambulatory Visit: Payer: Self-pay

## 2020-09-03 ENCOUNTER — Telehealth: Payer: Medicare HMO

## 2020-09-03 DIAGNOSIS — E1122 Type 2 diabetes mellitus with diabetic chronic kidney disease: Secondary | ICD-10-CM | POA: Diagnosis not present

## 2020-09-03 DIAGNOSIS — I1310 Hypertensive heart and chronic kidney disease without heart failure, with stage 1 through stage 4 chronic kidney disease, or unspecified chronic kidney disease: Secondary | ICD-10-CM | POA: Diagnosis not present

## 2020-09-03 DIAGNOSIS — I2510 Atherosclerotic heart disease of native coronary artery without angina pectoris: Secondary | ICD-10-CM

## 2020-09-03 DIAGNOSIS — N184 Chronic kidney disease, stage 4 (severe): Secondary | ICD-10-CM

## 2020-09-03 DIAGNOSIS — M069 Rheumatoid arthritis, unspecified: Secondary | ICD-10-CM

## 2020-09-03 DIAGNOSIS — I739 Peripheral vascular disease, unspecified: Secondary | ICD-10-CM
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
## 2020-10-18 DIAGNOSIS — E1165 Type 2 diabetes mellitus with hyperglycemia: Secondary | ICD-10-CM | POA: Diagnosis not present

## 2020-11-01 DIAGNOSIS — B351 Tinea unguium: Secondary | ICD-10-CM | POA: Diagnosis not present

## 2020-11-01 DIAGNOSIS — L603 Nail dystrophy: Secondary | ICD-10-CM | POA: Diagnosis not present

## 2020-11-14 ENCOUNTER — Ambulatory Visit (INDEPENDENT_AMBULATORY_CARE_PROVIDER_SITE_OTHER): Payer: Medicare HMO | Admitting: Internal Medicine

## 2020-11-14 ENCOUNTER — Other Ambulatory Visit: Payer: Self-pay

## 2020-11-14 ENCOUNTER — Encounter: Payer: Self-pay | Admitting: Internal Medicine

## 2020-11-14 VITALS — BP 130/62 | HR 70 | Temp 98.1°F | Ht 64.0 in | Wt 137.6 lb

## 2020-11-14 DIAGNOSIS — Z23 Encounter for immunization: Secondary | ICD-10-CM | POA: Diagnosis not present

## 2020-11-14 DIAGNOSIS — N184 Chronic kidney disease, stage 4 (severe): Secondary | ICD-10-CM

## 2020-11-14 DIAGNOSIS — I1310 Hypertensive heart and chronic kidney disease without heart failure, with stage 1 through stage 4 chronic kidney disease, or unspecified chronic kidney disease: Secondary | ICD-10-CM

## 2020-11-14 DIAGNOSIS — E1122 Type 2 diabetes mellitus with diabetic chronic kidney disease: Secondary | ICD-10-CM | POA: Diagnosis not present

## 2020-11-14 DIAGNOSIS — Z0000 Encounter for general adult medical examination without abnormal findings: Secondary | ICD-10-CM

## 2020-11-14 DIAGNOSIS — R0989 Other specified symptoms and signs involving the circulatory and respiratory systems: Secondary | ICD-10-CM

## 2020-11-14 DIAGNOSIS — M25561 Pain in right knee: Secondary | ICD-10-CM | POA: Diagnosis not present

## 2020-11-14 LAB — POCT URINALYSIS DIPSTICK
Bilirubin, UA: NEGATIVE
Blood, UA: NEGATIVE
Glucose, UA: NEGATIVE
Ketones, UA: NEGATIVE
Nitrite, UA: NEGATIVE
Protein, UA: NEGATIVE
Spec Grav, UA: 1.015 (ref 1.010–1.025)
Urobilinogen, UA: 0.2 E.U./dL
pH, UA: 7 (ref 5.0–8.0)

## 2020-11-14 LAB — POCT UA - MICROALBUMIN
Albumin/Creatinine Ratio, Urine, POC: <30
Creatinine, POC: 100 mg/dL
Microalbumin Ur, POC: 30 mg/L

## 2020-11-14 NOTE — Progress Notes (Signed)
I,Tianna Badgett,acting as a Education administrator for [name], MD.,have documented all relevant documentation on the behalf of [name], MD,as directed by  [name], MD while in the presence of [name], MD.  This visit occurred during the SARS-CoV-2 public health emergency.  Safety protocols were in place, including screening questions prior to the visit, additional usage of staff PPE, and extensive cleaning of exam room while observing appropriate contact time as indicated for disinfecting solutions.  Subjective:     Patient ID: [place] , female    DOB: 08-19-1927 , 87 y.o.   MRN: [id number]   Chief Complaint  Patient presents with   Annual Exam   Diabetes   Hypertension    HPI  She is here today for a full physical examination. She does not have any specific concerns or complaints at this time.  She denies headaches, chest pain and shortness of breath.   Diabetes She presents for her follow-up diabetic visit. She has type 2 diabetes mellitus. There are no hypoglycemic associated symptoms. Pertinent negatives for diabetes include no blurred vision and no chest pain. There are no hypoglycemic complications. Risk factors for coronary artery disease include diabetes mellitus, hypertension, post-menopausal and sedentary lifestyle. She is compliant with treatment most of the time. She is following a diabetic diet. She never participates in exercise. Her home blood glucose trend is fluctuating minimally. An ACE inhibitor/angiotensin II receptor blocker is not being taken. Eye exam is not current.  Hypertension This is a chronic problem. The current episode started more than 1 year ago. The problem has been gradually improving since onset. The problem is controlled. Pertinent negatives include no blurred vision, chest pain, palpitations or shortness of breath. Past treatments include calcium channel blockers. The current treatment provides moderate improvement. Hypertensive  end-organ damage includes kidney disease.    Past Medical History:  Diagnosis Date   Cerebrovascular disease, unspecified    Coronary atherosclerosis of unspecified type of vessel, native or graft    Diabetes mellitus without complication ([place])    Other and unspecified hyperlipidemia    Rheumatoid arthritis(714.0)    Shingles 2003   Unspecified essential hypertension      Family History  Problem Relation Age of Onset   Cancer Mother    Hypertension Father    Cancer Brother    Hypertension Paternal Grandmother    Heart disease Brother    Diabetes Neg Hx    Coronary artery disease Neg Hx      Current Outpatient Medications:    Accu-Chek Softclix Lancets lancets, Use as instructed to check blood sugars up to 3 times a day  Dx code e11.65, Disp: 100 each, Rfl: 12   acetaminophen (TYLENOL) 650 MG CR tablet, Take 650 mg by mouth every 8 (eight) hours as needed for pain., Disp: , Rfl:    amLODipine (NORVASC) 5 MG tablet, TAKE 1 TABLET(5 MG) BY MOUTH DAILY, Disp: 90 tablet, Rfl: 2   Blood Glucose Calibration (ACCU-CHEK AVIVA) SOLN, Use with accu-check aviva machine, Disp: 1 each, Rfl: 3   Blood Glucose Monitoring Suppl (ACCU-CHEK AVIVA PLUS) w/Device KIT, Use to check  Blood sugars up to 3 times a day.  Dx code e11.65, Disp: 1 kit, Rfl: 3   carvedilol (COREG) 12.5 MG tablet, TAKE 1 TABLET(12.5 MG) BY MOUTH TWICE DAILY WITH A MEAL, Disp: 180 tablet, Rfl: 1   chlorthalidone (HYGROTON) 25 MG tablet, Take 12.5 mg by mouth daily., Disp: , Rfl:    Cholecalciferol (  VITAMIN D) 50 MCG (2000 UT) CAPS, Take by mouth., Disp: , Rfl:    dipyridamole-aspirin (AGGRENOX) 200-25 MG 12hr capsule, Take 1 capsule by mouth daily., Disp: 90 capsule, Rfl: 1   glimepiride (AMARYL) 1 MG tablet, TAKE 1/2 TABLET BY MOUTH ON MONDAYS, WEDNESDAYS, AND FRIDAYS, Disp: 36 tablet, Rfl: 1   glucosamine-chondroitin 500-400 MG tablet, Take 1 tablet by mouth 3 (three) times daily., Disp: , Rfl:    glucose blood test strip,  Use as instructed to check blood sugars up to 3 times a day.  Dx code e11.65, Disp: 100 each, Rfl: 12   irbesartan (AVAPRO) 150 MG tablet, Take 1 tablet (150 mg total) by mouth daily., Disp: 90 tablet, Rfl: 2   Multiple Vitamin (MULTIVITAMIN) capsule, Take 1 capsule by mouth daily., Disp: , Rfl:    nitroGLYCERIN (NITROSTAT) 0.4 MG SL tablet, PLACE 1 TABLET UNDER THE TONGUE AT THE FIRST SIGN OF ATTACK, MAY REPEAT EVERY 5 MINUTES FOR 3 DOSES IN 15 MINUTES. IF PAIN PERSISTS CALL 911, Disp: 50 tablet, Rfl: 0   rosuvastatin (CRESTOR) 5 MG tablet, One tab po M- F, skip Sat/Sun, Disp: 90 tablet, Rfl: 1   No Known Allergies    The patient states she uses post menopausal status for birth control. Last LMP was No LMP recorded. Patient is postmenopausal.. Negative for Dysmenorrhea. Negative for: breast discharge, breast lump(s), breast pain and breast self exam. Associated symptoms include abnormal vaginal bleeding. Pertinent negatives include abnormal bleeding (hematology), anxiety, decreased libido, depression, difficulty falling sleep, dyspareunia, history of infertility, nocturia, sexual dysfunction, sleep disturbances, urinary incontinence, urinary urgency, vaginal discharge and vaginal itching. Diet regular.The patient states her exercise level is  minimal.   . The patient's tobacco use is:  Social History   Tobacco Use  Smoking Status Never  Smokeless Tobacco Never  . She has been exposed to passive smoke. The patient's alcohol use is:  Social History   Substance and Sexual Activity  Alcohol Use No   Review of Systems  Constitutional: Negative.   HENT: Negative.    Eyes: Negative.  Negative for blurred vision.  Respiratory: Negative.  Negative for shortness of breath.   Cardiovascular: Negative.  Negative for chest pain and palpitations.  Gastrointestinal: Negative.   Endocrine: Negative.   Genitourinary: Negative.   Musculoskeletal:  Positive for arthralgias.       She c/o r knee pain.  There is some pain with ambulation.   Skin: Negative.   Allergic/Immunologic: Negative.   Neurological: Negative.   Hematological: Negative.   Psychiatric/Behavioral: Negative.      Today's Vitals   11/14/20 1424  BP: 130/62  Pulse: 70  Temp: 98.1 F (36.7 C)  TempSrc: Oral  Weight: 137 lb 9.6 oz (62.4 kg)  Height: '5\' 4"'  (1.626 m)   Body mass index is 23.62 kg/m.  Wt Readings from Last 3 Encounters:  11/14/20 137 lb 9.6 oz (62.4 kg)  08/28/20 136 lb 8 oz (61.9 kg)  07/19/20 136 lb (61.7 kg)    Objective:  Physical Exam Vitals and nursing note reviewed.  Constitutional:      Appearance: Normal appearance.  HENT:     Head: Normocephalic and atraumatic.     Right Ear: Tympanic membrane, ear canal and external ear normal.     Left Ear: Tympanic membrane, ear canal and external ear normal.     Nose:     Comments: Masked     Mouth/Throat:     Comments: Masked  Eyes:  Extraocular Movements: Extraocular movements intact.     Conjunctiva/sclera: Conjunctivae normal.     Pupils: Pupils are equal, round, and reactive to light.  Cardiovascular:     Rate and Rhythm: Normal rate and regular rhythm.     Pulses:          Dorsalis pedis pulses are 0 on the right side and 1+ on the left side.     Heart sounds: Normal heart sounds.  Pulmonary:     Effort: Pulmonary effort is normal.     Breath sounds: Normal breath sounds.  Chest:  Breasts:    Tanner Score is 5.     Right: Normal.     Left: Normal.  Abdominal:     General: Abdomen is flat. Bowel sounds are normal.     Palpations: Abdomen is soft.  Genitourinary:    Comments: deferred Musculoskeletal:        General: Normal range of motion.     Cervical back: Normal range of motion and neck supple.  Feet:     Right foot:     Protective Sensation: 5 sites tested.  5 sites sensed.     Skin integrity: Dry skin present.     Toenail Condition: Right toenails are long.     Left foot:     Protective Sensation: 5 sites  tested.  5 sites sensed.     Skin integrity: Dry skin present.     Toenail Condition: Left toenails are long.  Skin:    General: Skin is warm and dry.  Neurological:     General: No focal deficit present.     Mental Status: She is alert and oriented to person, place, and time.  Psychiatric:        Mood and Affect: Mood normal.        Behavior: Behavior normal.        Assessment And Plan:     1. Routine general medical examination at health care facility Comments: A full exam was performed. Importance of monthly self breast exams was discussed with the patient.  PATIENT IS ADVISED TO GET 30-45 MINUTES REGULAR EXERCISE NO LESS THAN FOUR TO FIVE DAYS PER WEEK - BOTH WEIGHTBEARING EXERCISES AND AEROBIC ARE RECOMMENDED.  PATIENT IS ADVISED TO FOLLOW A HEALTHY DIET WITH AT LEAST SIX FRUITS/VEGGIES PER DAY, DECREASE INTAKE OF RED MEAT, AND TO INCREASE FISH INTAKE TO TWO DAYS PER WEEK.  MEATS/FISH SHOULD NOT BE FRIED, BAKED OR BROILED IS PREFERABLE.  IT IS ALSO IMPORTANT TO CUT BACK ON YOUR SUGAR INTAKE. PLEASE AVOID ANYTHING WITH ADDED SUGAR, CORN SYRUP OR OTHER SWEETENERS. IF YOU MUST USE A SWEETENER, YOU CAN TRY STEVIA. IT IS ALSO IMPORTANT TO AVOID ARTIFICIALLY SWEETENERS AND DIET BEVERAGES. LASTLY, I SUGGEST WEARING SPF 50 SUNSCREEN ON EXPOSED PARTS AND ESPECIALLY WHEN IN THE DIRECT SUNLIGHT FOR AN EXTENDED PERIOD OF TIME.  PLEASE AVOID FAST FOOD RESTAURANTS AND INCREASE YOUR WATER INTAKE.  2. Type 2 diabetes mellitus with stage 4 chronic kidney disease, without long-term current use of insulin (HCC) Comments: Diabetic foot exam was performed. I will refer her to Podiatry as requested. She will f/u in 4 months for re-evaluation. I DISCUSSED WITH THE PATIENT AT LENGTH REGARDING THE GOALS OF GLYCEMIC CONTROL AND POSSIBLE LONG-TERM COMPLICATIONS.  I  ALSO STRESSED THE IMPORTANCE OF COMPLIANCE WITH HOME GLUCOSE MONITORING, DIETARY RESTRICTIONS INCLUDING AVOIDANCE OF SUGARY DRINKS/PROCESSED FOODS,  ALONG  WITH REGULAR EXERCISE.  I  ALSO STRESSED THE IMPORTANCE OF ANNUAL EYE EXAMS, SELF  FOOT CARE AND COMPLIANCE WITH OFFICE VISITS.  - POCT Urinalysis Dipstick (81002) - POCT UA - Microalbumin - EKG 12-Lead - CBC - Hemoglobin A1c - CMP14+EGFR - Protein electrophoresis, serum - Ambulatory referral to Podiatry  3. Hypertensive heart and renal disease with renal failure, stage 1 through stage 4 or unspecified chronic kidney disease, without heart failure Comments: Chronic, controlled. EKG performed, NSR w/o acute changes. Encouraged to follow low sodium diet. She will f/u in 4-6 months for re-evaluation.   4. Decreased dorsalis pedis pulse Comments: She has had ABIs performed, abnl on R. Has already had Vascular eval as well. She is encouraged to increase daily activity as tolerated.   5. Recurrent pain of right knee Comments: I will refer her to Ortho for further evaluation/radiographic studies.  - Ambulatory referral to Orthopedic Surgery  6. Immunization due - Flu Vaccine QUAD High Dose(Fluad)   Patient was given opportunity to ask questions. Patient verbalized understanding of the plan and was able to repeat key elements of the plan. All questions were answered to their satisfaction.   I, [name], MD, have reviewed all documentation for this visit. The documentation on 11/14/20 for the exam, diagnosis, procedures, and orders are all accurate and complete.  THE PATIENT IS ENCOURAGED TO PRACTICE SOCIAL DISTANCING DUE TO THE COVID-19 PANDEMIC.

## 2020-11-14 NOTE — Patient Instructions (Signed)
Health Maintenance, Female Adopting a healthy lifestyle and getting preventive care are important in promoting health and wellness. Ask your health care provider about: The right schedule for you to have regular tests and exams. Things you can do on your own to prevent diseases and keep yourself healthy. What should I know about diet, weight, and exercise? Eat a healthy diet  Eat a diet that includes plenty of vegetables, fruits, low-fat dairy products, and lean protein. Do not eat a lot of foods that are high in solid fats, added sugars, or sodium. Maintain a healthy weight Body mass index (BMI) is used to identify weight problems. It estimates body fat based on height and weight. Your health care provider can help determine your BMI and help you achieve or maintain a healthy weight. Get regular exercise Get regular exercise. This is one of the most important things you can do for your health. Most adults should: Exercise for at least 150 minutes each week. The exercise should increase your heart rate and make you sweat (moderate-intensity exercise). Do strengthening exercises at least twice a week. This is in addition to the moderate-intensity exercise. Spend less time sitting. Even light physical activity can be beneficial. Watch cholesterol and blood lipids Have your blood tested for lipids and cholesterol at 85 years of age, then have this test every 5 years. Have your cholesterol levels checked more often if: Your lipid or cholesterol levels are high. You are older than 85 years of age. You are at high risk for heart disease. What should I know about cancer screening? Depending on your health history and family history, you may need to have cancer screening at various ages. This may include screening for: Breast cancer. Cervical cancer. Colorectal cancer. Skin cancer. Lung cancer. What should I know about heart disease, diabetes, and high blood pressure? Blood pressure and heart  disease High blood pressure causes heart disease and increases the risk of stroke. This is more likely to develop in people who have high blood pressure readings, are of African descent, or are overweight. Have your blood pressure checked: Every 3-5 years if you are 18-39 years of age. Every year if you are 40 years old or older. Diabetes Have regular diabetes screenings. This checks your fasting blood sugar level. Have the screening done: Once every three years after age 40 if you are at a normal weight and have a low risk for diabetes. More often and at a younger age if you are overweight or have a high risk for diabetes. What should I know about preventing infection? Hepatitis B If you have a higher risk for hepatitis B, you should be screened for this virus. Talk with your health care provider to find out if you are at risk for hepatitis B infection. Hepatitis C Testing is recommended for: Everyone born from 1945 through 1965. Anyone with known risk factors for hepatitis C. Sexually transmitted infections (STIs) Get screened for STIs, including gonorrhea and chlamydia, if: You are sexually active and are younger than 85 years of age. You are older than 85 years of age and your health care provider tells you that you are at risk for this type of infection. Your sexual activity has changed since you were last screened, and you are at increased risk for chlamydia or gonorrhea. Ask your health care provider if you are at risk. Ask your health care provider about whether you are at high risk for HIV. Your health care provider may recommend a prescription medicine   to help prevent HIV infection. If you choose to take medicine to prevent HIV, you should first get tested for HIV. You should then be tested every 3 months for as long as you are taking the medicine. Pregnancy If you are about to stop having your period (premenopausal) and you may become pregnant, seek counseling before you get  pregnant. Take 400 to 800 micrograms (mcg) of folic acid every day if you become pregnant. Ask for birth control (contraception) if you want to prevent pregnancy. Osteoporosis and menopause Osteoporosis is a disease in which the bones lose minerals and strength with aging. This can result in bone fractures. If you are 65 years old or older, or if you are at risk for osteoporosis and fractures, ask your health care provider if you should: Be screened for bone loss. Take a calcium or vitamin D supplement to lower your risk of fractures. Be given hormone replacement therapy (HRT) to treat symptoms of menopause. Follow these instructions at home: Lifestyle Do not use any products that contain nicotine or tobacco, such as cigarettes, e-cigarettes, and chewing tobacco. If you need help quitting, ask your health care provider. Do not use street drugs. Do not share needles. Ask your health care provider for help if you need support or information about quitting drugs. Alcohol use Do not drink alcohol if: Your health care provider tells you not to drink. You are pregnant, may be pregnant, or are planning to become pregnant. If you drink alcohol: Limit how much you use to 0-1 drink a day. Limit intake if you are breastfeeding. Be aware of how much alcohol is in your drink. In the [place] one drink equals one 12 oz bottle of beer (355 mL), one 5 oz glass of wine (148 mL), or one 1 oz glass of hard liquor (44 mL). General instructions Schedule regular health, dental, and eye exams. Stay current with your vaccines. Tell your health care provider if: You often feel depressed. You have ever been abused or do not feel safe at home. Summary Adopting a healthy lifestyle and getting preventive care are important in promoting health and wellness. Follow your health care provider's instructions about healthy diet, exercising, and getting tested or screened for diseases. Follow your health care provider's  instructions on monitoring your cholesterol and blood pressure. This information is not intended to replace advice given to you by your health care provider. Make sure you discuss any questions you have with your health care provider. Document Revised: 04/13/2020 Document Reviewed: 01/27/2018 Elsevier Patient Education  2022 Elsevier Inc.  

## 2020-11-15 LAB — HEMOGLOBIN A1C
Est. average glucose Bld gHb Est-mCnc: 151 mg/dL
Hgb A1c MFr Bld: 6.9 % — ABNORMAL HIGH (ref 4.8–5.6)

## 2020-11-15 LAB — CMP14+EGFR
ALT: 10 IU/L (ref 0–32)
AST: 18 IU/L (ref 0–40)
Albumin/Globulin Ratio: 1.5 (ref 1.2–2.2)
Albumin: 4.4 g/dL (ref 3.5–4.6)
Alkaline Phosphatase: 90 IU/L (ref 44–121)
BUN/Creatinine Ratio: 30 — ABNORMAL HIGH (ref 12–28)
BUN: 58 mg/dL — ABNORMAL HIGH (ref 10–36)
Bilirubin Total: <0.2 mg/dL (ref 0.0–1.2)
CO2: 23 mmol/L (ref 20–29)
Calcium: 9.8 mg/dL (ref 8.7–10.3)
Chloride: 105 mmol/L (ref 96–106)
Creatinine, Ser: 1.91 mg/dL — ABNORMAL HIGH (ref 0.57–1.00)
Globulin, Total: 2.9 g/dL (ref 1.5–4.5)
Glucose: 84 mg/dL (ref 70–99)
Potassium: 5 mmol/L (ref 3.5–5.2)
Sodium: 142 mmol/L (ref 134–144)
Total Protein: 7.3 g/dL (ref 6.0–8.5)
eGFR: 24 mL/min/{1.73_m2} — ABNORMAL LOW (ref >59)

## 2020-11-15 LAB — PROTEIN ELECTROPHORESIS, SERUM
A/G Ratio: 1.1 (ref 0.7–1.7)
Albumin ELP: 3.8 g/dL (ref 2.9–4.4)
Alpha 1: 0.2 g/dL (ref 0.0–0.4)
Alpha 2: 0.9 g/dL (ref 0.4–1.0)
Beta: 1.1 g/dL (ref 0.7–1.3)
Gamma Globulin: 1.4 g/dL (ref 0.4–1.8)
Globulin, Total: 3.5 g/dL (ref 2.2–3.9)

## 2020-11-15 LAB — CBC
Hematocrit: 32.8 % — ABNORMAL LOW (ref 34.0–46.6)
Hemoglobin: 10.8 g/dL — ABNORMAL LOW (ref 11.1–15.9)
MCH: 31.9 pg (ref 26.6–33.0)
MCHC: 32.9 g/dL (ref 31.5–35.7)
MCV: 97 fL (ref 79–97)
Platelets: 206 10*3/uL (ref 150–450)
RBC: 3.39 x10E6/uL — ABNORMAL LOW (ref 3.77–5.28)
RDW: 13.1 % (ref 11.7–15.4)
WBC: 4.4 10*3/uL (ref 3.4–10.8)

## 2020-11-19 ENCOUNTER — Ambulatory Visit (INDEPENDENT_AMBULATORY_CARE_PROVIDER_SITE_OTHER): Payer: Medicare HMO

## 2020-11-19 ENCOUNTER — Telehealth: Payer: Medicare HMO

## 2020-11-19 DIAGNOSIS — E1122 Type 2 diabetes mellitus with diabetic chronic kidney disease: Secondary | ICD-10-CM

## 2020-11-19 DIAGNOSIS — M069 Rheumatoid arthritis, unspecified: Secondary | ICD-10-CM

## 2020-11-19 DIAGNOSIS — I1310 Hypertensive heart and chronic kidney disease without heart failure, with stage 1 through stage 4 chronic kidney disease, or unspecified chronic kidney disease: Secondary | ICD-10-CM

## 2020-11-19 DIAGNOSIS — I2510 Atherosclerotic heart disease of native coronary artery without angina pectoris: Secondary | ICD-10-CM

## 2020-11-19 DIAGNOSIS — I739 Peripheral vascular disease, unspecified: Secondary | ICD-10-CM

## 2020-11-19 DIAGNOSIS — N184 Chronic kidney disease, stage 4 (severe): Secondary | ICD-10-CM

## 2020-12-02 NOTE — Patient Instructions (Signed)
Visit Information  PATIENT GOALS:  Goals Addressed      Diabetes Management - Disease Progression Prevented or Minimized   On track    Timeframe:  Long-Range Goal Priority:  Medium Start Date: 04/13/20                            Expected End Date:  04/13/21  Next Follow Up Date:  11/19/20      Patient Goals/Self-Care Activities:  Self administer medications as prescribed Attend all scheduled provider appointments Call pharmacy for medication refills Call provider office for new concerns or questions Continue to monitor CBG's as discussed Continue to adhere to dietary and exercise recommendations                    Rheumatoid Arthritis - Disease Progression Prevented or Minimized       Timeframe:  Long-Range Goal Priority:  High Start Date: 04/13/20                             Expected End Date: 04/13/21  Next Follow Up Date: 01/21/21     Patient Goals/Self-Care Activities:  Self administer medications as prescribed Attend all scheduled provider appointments Call pharmacy for medication refills Call provider office for new concerns or questions Continue to follow exercise recommendations and alert Rheumatologist of new or worsening symptoms                  Track and Manage My Blood Pressure-Hypertension   On track    Timeframe:  Long-Range Goal Priority:  Medium Start Date: 04/13/20                           Expected End Date:  04/13/21                     Follow Up Date: 01/21/21   Attend all scheduled provider appointments Call pharmacy for medication refills Call provider office for new concerns or questions Continue to adhere to a low sodium diet as discussed Continue to adhere to dietary and exercise recommendations Continue to monitor BP at home and record readings, reporting abnormal readings to PCP as discussed    Why is this important?   You won't feel high blood pressure, but it can still hurt your blood vessels.  High blood pressure can cause heart or kidney  problems. It can also cause a stroke.  Making lifestyle changes like losing a [place] weight or eating less salt will help.  Checking your blood pressure at home and at different times of the day can help to control blood pressure.  If the doctor prescribes medicine remember to take it the way the doctor ordered.  Call the office if you cannot afford the medicine or if there are questions about it.     Notes:      The patient verbalized understanding of instructions, educational materials, and care plan provided today and declined offer to receive copy of patient instructions, educational materials, and care plan.   Telephone follow up appointment with care management team member scheduled for: 01/21/21  Barb Merino, RN, BSN, CCM Care Management Coordinator [facility]  Direct Phone: [phone]

## 2020-12-02 NOTE — Chronic Care Management (AMB) (Signed)
Chronic Care Management   CCM RN Visit Note  11/19/2020 Name: [name] MRN: [id number] DOB: [birth year]  Subjective: [name] is a 87 y.o. year old female who is a primary care patient of Glendale Chard, MD. The care management team was consulted for assistance with disease management and care coordination needs.    Engaged with patient by telephone for follow up visit in response to provider referral for case management and/or care coordination services.   Consent to Services:  The patient was given information about Chronic Care Management services, agreed to services, and gave verbal consent prior to initiation of services.  Please see initial visit note for detailed documentation.   Patient agreed to services and verbal consent obtained.   Assessment: Review of patient past medical history, allergies, medications, health status, including review of consultants reports, laboratory and other test data, was performed as part of comprehensive evaluation and provision of chronic care management services.   SDOH (Social Determinants of Health) assessments and interventions performed:    CCM Care Plan  No Known Allergies  Outpatient Encounter Medications as of 11/19/2020  Medication Sig   Accu-Chek Softclix Lancets lancets Use as instructed to check blood sugars up to 3 times a day  Dx code e11.65   acetaminophen (TYLENOL) 650 MG CR tablet Take 650 mg by mouth every 8 (eight) hours as needed for pain.   amLODipine (NORVASC) 5 MG tablet TAKE 1 TABLET(5 MG) BY MOUTH DAILY   Blood Glucose Calibration (ACCU-CHEK AVIVA) SOLN Use with accu-check aviva machine   Blood Glucose Monitoring Suppl (ACCU-CHEK AVIVA PLUS) w/Device KIT Use to check  Blood sugars up to 3 times a day.  Dx code e11.65   carvedilol (COREG) 12.5 MG tablet TAKE 1 TABLET(12.5 MG) BY MOUTH TWICE DAILY WITH A MEAL   chlorthalidone (HYGROTON) 25 MG tablet Take 12.5 mg by mouth daily.   Cholecalciferol (VITAMIN  D) 50 MCG (2000 UT) CAPS Take by mouth.   dipyridamole-aspirin (AGGRENOX) 200-25 MG 12hr capsule Take 1 capsule by mouth daily.   glimepiride (AMARYL) 1 MG tablet TAKE 1/2 TABLET BY MOUTH ON MONDAYS, WEDNESDAYS, AND FRIDAYS   glucosamine-chondroitin 500-400 MG tablet Take 1 tablet by mouth 3 (three) times daily.   glucose blood test strip Use as instructed to check blood sugars up to 3 times a day.  Dx code e11.65   irbesartan (AVAPRO) 150 MG tablet Take 1 tablet (150 mg total) by mouth daily.   Multiple Vitamin (MULTIVITAMIN) capsule Take 1 capsule by mouth daily.   nitroGLYCERIN (NITROSTAT) 0.4 MG SL tablet PLACE 1 TABLET UNDER THE TONGUE AT THE FIRST SIGN OF ATTACK, MAY REPEAT EVERY 5 MINUTES FOR 3 DOSES IN 15 MINUTES. IF PAIN PERSISTS CALL 911   rosuvastatin (CRESTOR) 5 MG tablet One tab po M- F, skip Sat/Sun   No facility-administered encounter medications on file as of 11/19/2020.    Patient Active Problem List   Diagnosis Date Noted   Right leg pain 07/26/2020   Onycholysis 07/26/2020   CKD (chronic kidney disease) stage 4, GFR 15-29 ml/min ([place]) 07/19/2020   Hypertensive heart and renal disease 03/22/2020   Gout 12/07/2017   Type 2 diabetes mellitus with stage 3 chronic kidney disease, without long-term current use of insulin ([place]) 02/11/2014   History of coronary artery bypass graft 07/01/2010   MYOCARDIAL INFARCTION, INFERIOR WALL, SUBSEQUENT CARE 05/12/2008   CAD, NATIVE VESSEL 05/12/2008   HYPERLIPIDEMIA-MIXED 05/11/2008   Essential hypertension 05/11/2008   CAD, UNSPECIFIED SITE 05/11/2008  CEREBROVASCULAR DISEASE 05/11/2008   Rheumatoid arthritis ([place]) 05/11/2008    Conditions to be addressed/monitored: DM related to type 2 with stage 3 CKD, Rheumatoid Arthritis, HTN, PAD, CAD   Care Plan : Diabetes Type 2 (Adult)  Updates made by Shannon Logan, RN since 11/19/2020 12:00 AM     Problem: Disease Progression (Diabetes, Type 2)   Priority: Medium     Long-Range  Goal: Disease Progression Prevented or Minimized   Start Date: 04/13/2020  Expected End Date: 04/12/2021  Recent Progress: On track  Priority: Medium  Note:   Objective:  Lab Results  Component Value Date   HGBA1C 6.9 (H) 11/14/2020   Lab Results  Component Value Date   CREATININE 1.91 (H) 11/14/2020   CREATININE 1.7 (A) 06/25/2020   CREATININE 1.99 (H) 03/08/2020   Lab Results  Component Value Date   EGFR 24 (L) 11/14/2020  Current Barriers:  Knowledge Deficits related to basic Diabetes pathophysiology and self care/management Knowledge Deficits related to medications used for management of diabetes Case Manager Clinical Goal(s):  patient will demonstrate improved adherence to prescribed treatment plan for diabetes self care/management as evidenced by: adherence to ADA/ carb modified diet exercise 5 days/week adherence to prescribed medication regimen contacting provider for new or worsened symptoms or questions Interventions:  11/19/20 completed successful outbound call with patient  Collaboration with Glendale Chard, MD regarding development and update of comprehensive plan of care as evidenced by provider attestation and co-signature Inter-disciplinary care team collaboration (see longitudinal plan of care) Provided education to patient about basic DM disease process Review of patient status, including review of consultant's reports, relevant laboratory and other test results, and medications completed. Reviewed medications with patient and discussed importance of medication adherence Educated patient on dietary and exercise recommendations; daily glycemic control FBS 80-130, <180 after meals;15'15' rule Advised patient, providing education and rationale, to check cbg daily before meals and at bedtime and record, calling the CCM team and or PCP for findings outside established parameters Mailed printed educational material related to [place]  Discussed plans with  patient for ongoing care management follow up and provided patient with direct contact information for care management team Self-Care Activities Attends all scheduled provider appointments Checks blood sugars as prescribed and utilize hyper and hypoglycemia protocol as needed Adheres to prescribed ADA/carb modified Patient Goals: - manage portion size  Follow Up Plan: Telephone follow up appointment with care management team member scheduled for: 01/21/21    Care Plan : Hypertension (Adult)  Updates made by Shannon Logan, RN since 11/19/2020 12:00 AM     Problem: Disease Progression (Hypertension)   Priority: Medium     Long-Range Goal: Disease Progression Prevented or Minimized   Start Date: 04/13/2020  Expected End Date: 04/12/2021  Recent Progress: On track  Priority: Medium  Note:   BP Readings from Last 3 Encounters:  11/14/20 130/62  08/28/20 (!) 168/70  07/19/20 124/60    Current Barriers:  Knowledge Deficits related to basic understanding of hypertension pathophysiology and self care management Knowledge Deficits related to understanding of medications prescribed for management of hypertension Case Manager Clinical Goal(s):  patient will demonstrate improved adherence to prescribed treatment plan for hypertension as evidenced by taking all medications as prescribed, monitoring and recording blood pressure as directed, adhering to low sodium/DASH diet Interventions:  11/19/20 completed successful outbound call to patient  Collaboration with Glendale Chard, MD regarding development and update of comprehensive plan of care as evidenced by provider attestation and co-signature  Inter-disciplinary care team collaboration (see longitudinal plan of care) Evaluation of current treatment plan related to hypertension self management and patient's adherence to plan as established by provider. Provided education to patient re: stroke prevention, s/s of heart attack and stroke, DASH  diet, complications of uncontrolled blood pressure Reviewed medications with patient and discussed importance of compliance Advised patient, providing education and rationale, to monitor blood pressure daily and record, calling PCP for findings outside established parameters.  Instructed patient on how to accurately self monitor her BP at home; Mailed printed instructions Discussed plans with patient for ongoing care management follow up and provided patient with direct contact information for care management team Self-Care Activities: Self administers medications as prescribed Attends all scheduled provider appointments Calls provider office for new concerns, questions, or BP outside discussed parameters Checks BP and records as discussed Follows a low sodium diet/DASH diet Patient Goals: - check blood pressure 3 times per week - choose a place to take my blood pressure (home, clinic or office, retail store) - write blood pressure results in a log or diary - learn about high blood pressure  Follow Up Plan: Telephone follow up appointment with care management team member scheduled for: 01/21/21    Care Plan : Rheumatoid Arthritis  Updates made by Shannon Logan, RN since 12/02/2020 12:00 AM     Problem: Rheumatoid Arthritis worsened by PAD   Priority: High     Long-Range Goal: RA pain management optimized   Start Date: 04/13/2020  Expected End Date: 04/12/2021  Recent Progress: On track  Priority: High  Note:   Current Barriers:  Knowledge Deficits related to disease process and Self Health management for PAD  Chronic Disease Management support and education needs related to DM related to type 2 with stage 3 CKD, Rheumatoid Arthritis, HTN, PAD, CAD   Arthritis to bilateral hips Limited ROM secondary to having Arthritic pain  Nurse Case Manager Clinical Goal(s):  Patient will maintain her ability to perform Self care and stay physically active CCM RN CM Interventions:  08/30/20  call completed with patient  Evaluation of current treatment plan related to RA worsened by PAD and patient's adherence to plan as established by provider; discussed patient manages this hip pain by staying active within her home by stair climbing and moving around within her home while attending to her daily activities and house chores Reviewed and discussed recent follow up with V[facility]. Assessment/Plan:  [facility] ASSESSMENT / PLAN: [name] is a 87 y.o. female with right lower extremity swelling and episodic pain. The pain is not typical of peripheral arterial disease.  She has no palpable pedal pulses, but good flow to her feet by noninvasive testing today.  Her right lower extremity swelling and discomfort is likely related to autonomic dysregulation after her stroke.  I encouraged her to continue compression garments and walk as much as she can.  She can follow-up with me as needed CHIEF COMPLAINT: Right leg pain HISTORY OF PRESENT ILLNESS: [name] is a 87 y.o. female referred to clinic for evaluation of right lower extremity pain.  The patient suffered a stroke as ago and has had trouble with her right lower extremity since.  She reports problems with swelling which worsened over the course of the day.  She has been wearing compression garments with symptomatic relief.  She also describes a sensation of cold in her right lower extremity.  She does not have symptoms typical of intermittent claudication, but  I do not think she walks far or fast enough to claudicate.  She does not have symptoms typical of ischemic rest pain.  She does not have any ulceration about her foot. Discussed plans with patient for ongoing care management follow up and provided patient with direct contact information for care management team Patient Self Care Activities:  Self administer medications as prescribed Attend all scheduled provider appointments Call pharmacy for  medication refills Call provider office for new concerns or questions Continue to follow exercise recommendations and alert Rheumatologist of new or worsening symptoms   Follow Up Plan: Telephone follow up appointment with care management team member scheduled for: 01/21/21      Plan:Telephone follow up appointment with care management team member scheduled for:  01/21/21  Shannon Merino, RN, BSN, CCM Care Management Coordinator [facility]  Direct Phone: [phone]

## 2020-12-06 ENCOUNTER — Other Ambulatory Visit: Payer: Self-pay

## 2020-12-06 ENCOUNTER — Ambulatory Visit (INDEPENDENT_AMBULATORY_CARE_PROVIDER_SITE_OTHER): Payer: Medicare HMO | Admitting: Sports Medicine

## 2020-12-06 ENCOUNTER — Encounter: Payer: Self-pay | Admitting: Sports Medicine

## 2020-12-06 DIAGNOSIS — N183 Chronic kidney disease, stage 3 unspecified: Secondary | ICD-10-CM

## 2020-12-06 DIAGNOSIS — M79609 Pain in unspecified limb: Secondary | ICD-10-CM | POA: Diagnosis not present

## 2020-12-06 DIAGNOSIS — E1122 Type 2 diabetes mellitus with diabetic chronic kidney disease: Secondary | ICD-10-CM

## 2020-12-06 DIAGNOSIS — M79604 Pain in right leg: Secondary | ICD-10-CM

## 2020-12-06 DIAGNOSIS — R531 Weakness: Secondary | ICD-10-CM | POA: Diagnosis not present

## 2020-12-06 DIAGNOSIS — Z8673 Personal history of transient ischemic attack (TIA), and cerebral infarction without residual deficits: Secondary | ICD-10-CM

## 2020-12-06 DIAGNOSIS — B351 Tinea unguium: Secondary | ICD-10-CM

## 2020-12-06 NOTE — Progress Notes (Signed)
Subjective: [name] is a 87 y.o. female patient with history of diabetes who presents to office today complaining of long,mildly painful nails  while ambulating in shoes; unable to trim. Patient states that he has issues with pain in her ankles knees upper thighs and a history of significant arthritis.  Patient uses walker because she no longer can use a cane anymore for stability purposes.  Right side is weak and has progressively gotten worse over the last 2 to 3 years.  History of stroke in 2003.  Fasting blood sugar not recorded.   Patient Active Problem List   Diagnosis Date Noted   Right leg pain 07/26/2020   Onycholysis 07/26/2020   CKD (chronic kidney disease) stage 4, GFR 15-29 ml/min (HCC) 07/19/2020   Hypertensive heart and renal disease 03/22/2020   Gout 12/07/2017   Type 2 diabetes mellitus with stage 3 chronic kidney disease, without long-term current use of insulin ([place]) 02/11/2014   History of coronary artery bypass graft 07/01/2010   MYOCARDIAL INFARCTION, INFERIOR WALL, SUBSEQUENT CARE 05/12/2008   CAD, NATIVE VESSEL 05/12/2008   HYPERLIPIDEMIA-MIXED 05/11/2008   Essential hypertension 05/11/2008   CAD, UNSPECIFIED SITE 05/11/2008   CEREBROVASCULAR DISEASE 05/11/2008   Rheumatoid arthritis ([place]) 05/11/2008   Current Outpatient Medications on File Prior to Visit  Medication Sig Dispense Refill   Accu-Chek Softclix Lancets lancets Use as instructed to check blood sugars up to 3 times a day  Dx code e11.65 100 each 12   acetaminophen (TYLENOL) 650 MG CR tablet Take 650 mg by mouth every 8 (eight) hours as needed for pain.     amLODipine (NORVASC) 5 MG tablet TAKE 1 TABLET(5 MG) BY MOUTH DAILY 90 tablet 2   Blood Glucose Calibration (ACCU-CHEK AVIVA) SOLN Use with accu-check aviva machine 1 each 3   Blood Glucose Monitoring Suppl (ACCU-CHEK AVIVA PLUS) w/Device KIT Use to check  Blood sugars up to 3 times a day.  Dx code e11.65 1 kit 3   carvedilol (COREG) 12.5  MG tablet TAKE 1 TABLET(12.5 MG) BY MOUTH TWICE DAILY WITH A MEAL 180 tablet 1   chlorthalidone (HYGROTON) 25 MG tablet Take 12.5 mg by mouth daily.     Cholecalciferol (VITAMIN D) 50 MCG (2000 UT) CAPS Take by mouth.     dipyridamole-aspirin (AGGRENOX) 200-25 MG 12hr capsule Take 1 capsule by mouth daily. 90 capsule 1   glimepiride (AMARYL) 1 MG tablet TAKE 1/2 TABLET BY MOUTH ON MONDAYS, WEDNESDAYS, AND FRIDAYS 36 tablet 1   glucosamine-chondroitin 500-400 MG tablet Take 1 tablet by mouth 3 (three) times daily.     glucose blood test strip Use as instructed to check blood sugars up to 3 times a day.  Dx code e11.65 100 each 12   irbesartan (AVAPRO) 150 MG tablet Take 1 tablet (150 mg total) by mouth daily. 90 tablet 2   Multiple Vitamin (MULTIVITAMIN) capsule Take 1 capsule by mouth daily.     nitroGLYCERIN (NITROSTAT) 0.4 MG SL tablet PLACE 1 TABLET UNDER THE TONGUE AT THE FIRST SIGN OF ATTACK, MAY REPEAT EVERY 5 MINUTES FOR 3 DOSES IN 15 MINUTES. IF PAIN PERSISTS CALL 911 50 tablet 0   rosuvastatin (CRESTOR) 5 MG tablet One tab po M- F, skip Sat/Sun 90 tablet 1   No current facility-administered medications on file prior to visit.   No Known Allergies  Recent Results (from the past 2160 hour(s))  CBC     Status: Abnormal   Collection Time: 11/14/20  3:24 PM  Result Value Ref Range   WBC 4.4 3.4 - 10.8 x10E3/uL   RBC 3.39 (L) 3.77 - 5.28 x10E6/uL   Hemoglobin 10.8 (L) 11.1 - 15.9 g/dL   Hematocrit 32.8 (L) 34.0 - 46.6 %   MCV 97 79 - 97 fL   MCH 31.9 26.6 - 33.0 pg   MCHC 32.9 31.5 - 35.7 g/dL   RDW 13.1 11.7 - 15.4 %   Platelets 206 150 - 450 x10E3/uL  Hemoglobin A1c     Status: Abnormal   Collection Time: 11/14/20  3:24 PM  Result Value Ref Range   Hgb A1c MFr Bld 6.9 (H) 4.8 - 5.6 %    Comment:          Prediabetes: 5.7 - 6.4          Diabetes: >6.4          Glycemic control for adults with diabetes: <7.0    Est. average glucose Bld gHb Est-mCnc 151 mg/dL  CMP14+EGFR      Status: Abnormal   Collection Time: 11/14/20  3:24 PM  Result Value Ref Range   Glucose 84 70 - 99 mg/dL    Comment:               **Please note reference interval change**   BUN 58 (H) 10 - 36 mg/dL   Creatinine, Ser 1.91 (H) 0.57 - 1.00 mg/dL   eGFR 24 (L) >59 mL/min/1.73   BUN/Creatinine Ratio 30 (H) 12 - 28   Sodium 142 134 - 144 mmol/L   Potassium 5.0 3.5 - 5.2 mmol/L   Chloride 105 96 - 106 mmol/L   CO2 23 20 - 29 mmol/L   Calcium 9.8 8.7 - 10.3 mg/dL   Total Protein 7.3 6.0 - 8.5 g/dL   Albumin 4.4 3.5 - 4.6 g/dL   Globulin, Total 2.9 1.5 - 4.5 g/dL   Albumin/Globulin Ratio 1.5 1.2 - 2.2   Bilirubin Total <0.2 0.0 - 1.2 mg/dL   Alkaline Phosphatase 90 44 - 121 IU/L   AST 18 0 - 40 IU/L   ALT 10 0 - 32 IU/L  Protein electrophoresis, serum     Status: None   Collection Time: 11/14/20  3:24 PM  Result Value Ref Range   Albumin ELP 3.8 2.9 - 4.4 g/dL   Alpha 1 0.2 0.0 - 0.4 g/dL   Alpha 2 0.9 0.4 - 1.0 g/dL   Beta 1.1 0.7 - 1.3 g/dL   Gamma Globulin 1.4 0.4 - 1.8 g/dL   M-Spike, % Not Observed Not Observed g/dL   Globulin, Total 3.5 2.2 - 3.9 g/dL   A/G Ratio 1.1 0.7 - 1.7   Please Note: Comment     Comment: Protein electrophoresis scan will follow via computer, mail, or courier delivery.    Interpretation: Comment     Comment: The SPE pattern appears unremarkable. Evidence of monoclonal protein is not apparent.   POCT UA - Microalbumin     Status: None   Collection Time: 11/14/20  4:07 PM  Result Value Ref Range   Microalbumin Ur, POC 30 mg/L   Creatinine, POC 100 mg/dL   Albumin/Creatinine Ratio, Urine, POC <30   POCT Urinalysis Dipstick ([id number])     Status: Abnormal   Collection Time: 11/14/20  4:08 PM  Result Value Ref Range   Color, UA yellow    Clarity, UA clear    Glucose, UA Negative Negative   Bilirubin, UA negative    Ketones, UA negative  Spec Grav, UA 1.015 1.010 - 1.025   Blood, UA negative    pH, UA 7.0 5.0 - 8.0   Protein, UA Negative  Negative   Urobilinogen, UA 0.2 0.2 or 1.0 E.U./dL   Nitrite, UA negative    Leukocytes, UA Small (1+) (A) Negative   Appearance     Odor      Objective: General: Patient is awake, alert, and oriented x 3 and in no acute distress.  Integument: Skin is warm, dry and supple bilateral. Nails are tender, long, thickened and dystrophic with subungual debris, consistent with onychomycosis, 1-5 bilateral. No signs of infection. No open lesions or preulcerative lesions present bilateral. Remaining integument unremarkable.  Vasculature:  Dorsalis Pedis pulse 1/4 bilateral. Posterior Tibial pulse 1/4 bilateral. Capillary fill time <5 sec 1-5 bilateral. Positive hair growth to the level of the digits.Temperature gradient within normal limits. No varicosities present bilateral.  Trace edema to the right lower extremity.  Neurology: The patient has diminished sensation measured with a 5.07/10g Semmes Weinstein Monofilament at all pedal sites bilateral . Vibratory sensation diminished bilateral with tuning fork.   Musculoskeletal: Asymptomatic pes planus pedal deformities noted bilateral.  There is limited range of motion at the right foot and ankle with significant weakness 4 out of 5 consistent with history of stroke on the right.  Assessment and Plan: Problem List Items Addressed This Visit       Endocrine   Type 2 diabetes mellitus with stage 3 chronic kidney disease, without long-term current use of insulin (HCC)     Other   Right leg pain   Other Visit Diagnoses     Pain due to onychomycosis of nail    -  Primary   Right sided weakness       History of stroke           -Examined patient. -Discussed and educated patient on diabetic foot care, especially with  regards to the vascular, neurological and musculoskeletal systems.  -Stressed the importance of good glycemic control and the detriment of not  controlling glucose levels in relation to the foot. -Mechanically debrided all  nails 1-5 bilateral using sterile nail nipper and filed with dremel without incident  -Advised patient to continue with her orthopedic follow-up and states that she has another appointment for them to also make her a special brace or insole as well -Answered all patient questions -Patient to return  in 3 months for at risk foot care -Patient advised to call the office if any problems or questions arise in the meantime.  Landis Martins, DPM

## 2020-12-07 ENCOUNTER — Ambulatory Visit: Payer: Self-pay

## 2020-12-07 ENCOUNTER — Ambulatory Visit (INDEPENDENT_AMBULATORY_CARE_PROVIDER_SITE_OTHER): Payer: Medicare HMO | Admitting: Orthopaedic Surgery

## 2020-12-07 ENCOUNTER — Encounter: Payer: Self-pay | Admitting: Orthopaedic Surgery

## 2020-12-07 DIAGNOSIS — M25561 Pain in right knee: Secondary | ICD-10-CM | POA: Diagnosis not present

## 2020-12-07 DIAGNOSIS — G8929 Other chronic pain: Secondary | ICD-10-CM | POA: Diagnosis not present

## 2020-12-07 DIAGNOSIS — M25551 Pain in right hip: Secondary | ICD-10-CM

## 2020-12-07 MED ORDER — LIDOCAINE HCL 1 % IJ SOLN
2.0000 mL | INTRAMUSCULAR | Status: AC | PRN
  Administered 2020-12-07: 2 mL

## 2020-12-07 MED ORDER — BUPIVACAINE HCL 0.25 % IJ SOLN
2.0000 mL | INTRAMUSCULAR | Status: AC | PRN
  Administered 2020-12-07: 2 mL via INTRA_ARTICULAR

## 2020-12-07 MED ORDER — METHYLPREDNISOLONE ACETATE 40 MG/ML IJ SUSP
40.0000 mg | INTRAMUSCULAR | Status: AC | PRN
Start: 2020-12-07 — End: 2020-12-07
  Administered 2020-12-07: 40 mg via INTRA_ARTICULAR

## 2020-12-07 NOTE — Progress Notes (Signed)
Office Visit Note   Patient: [name]           Date of Birth: Nov 15, 1927           MRN: [id number] Visit Date: 12/07/2020              Requested by: Glendale Chard, [address] [place],  [place] [address] PCP: Glendale Chard, MD   Assessment & Plan: Visit Diagnoses:  1. Chronic pain of right knee   2. Pain in right hip     Plan: Impression is advanced degenerative joint disease right hip and right knee.  We have discussed operative versus nonoperative treatment options to include cortisone injections.  Based on the patient's age and multiple comorbidities, she has elected to try cortisone injections first.  We we will proceed with the knee today.  We will refer her to Dr. Ernestina Patches for right hip injection.  She will keep a close eye on her blood sugar over the next few days.  Follow-up with [place] as needed. Follow-Up Instructions: Return if symptoms worsen or fail to improve.   Orders:  Orders Placed This Encounter  Procedures   XR KNEE 3 VIEW RIGHT   XR HIP UNILAT W OR W/O PELVIS 2-3 VIEWS RIGHT   No orders of the defined types were placed in this encounter.     Procedures: Large Joint Inj: R knee on 12/07/2020 10:28 AM Indications: pain Details: 22 G needle, anterolateral approach Medications: 2 mL lidocaine 1 %; 2 mL bupivacaine 0.25 %; 40 mg methylPREDNISolone acetate 40 MG/ML     Clinical Data: No additional findings.   Subjective: Chief Complaint  Patient presents with   Right Knee - Pain   Right Hip - Pain    HPI patient is a pleasant 87 year old female who comes in today with right hip and knee pain.  This is been ongoing for a while.  The pain is worse with ambulating and she is now using a walker because of this.  She has been taking Tylenol without significant relief.  She is unable to take NSAIDs due to underlying CKD.  She has not previously undergone injection to either the hip or knee in the past.  Of note, she does have a history  of MI, CVA and diabetes.  Review of Systems as detailed in HPI.  All others reviewed are negative.   Objective: Vital Signs: There were no vitals taken for this visit.  Physical Exam well-developed well-nourished female no acute distress.  Alert and oriented x3.  Ortho Exam right knee exam shows a small effusion.  Range of motion 0 to 95 degrees.  Medial joint line tenderness.  Moderate patellofemoral crepitus.  She is neurovascular tact distally.  Right hip shows a markedly positive logroll with little internal rotation.  No focal weakness.  Specialty Comments:  No specialty comments available.  Imaging: No results found.   PMFS History: Patient Active Problem List   Diagnosis Date Noted   Right leg pain 07/26/2020   Onycholysis 07/26/2020   CKD (chronic kidney disease) stage 4, GFR 15-29 ml/min ([place]) 07/19/2020   Hypertensive heart and renal disease 03/22/2020   Gout 12/07/2017   Type 2 diabetes mellitus with stage 3 chronic kidney disease, without long-term current use of insulin ([place]) 02/11/2014   History of coronary artery bypass graft 07/01/2010   MYOCARDIAL INFARCTION, INFERIOR WALL, SUBSEQUENT CARE 05/12/2008   CAD, NATIVE VESSEL 05/12/2008   HYPERLIPIDEMIA-MIXED 05/11/2008   Essential hypertension 05/11/2008  CAD, UNSPECIFIED SITE 05/11/2008   CEREBROVASCULAR DISEASE 05/11/2008   Rheumatoid arthritis ([place]) 05/11/2008   Past Medical History:  Diagnosis Date   Cerebrovascular disease, unspecified    Coronary atherosclerosis of unspecified type of vessel, native or graft    Diabetes mellitus without complication ([place])    Other and unspecified hyperlipidemia    Rheumatoid arthritis(714.0)    Shingles 2003   Unspecified essential hypertension     Family History  Problem Relation Age of Onset   Cancer Mother    Hypertension Father    Cancer Brother    Hypertension Paternal Grandmother    Heart disease Brother    Diabetes Neg Hx    Coronary artery disease Neg  Hx     Past Surgical History:  Procedure Laterality Date   Cardiac Bypass  2003   CATARACT EXTRACTION, BILATERAL  2007   Social History   Occupational History   Occupation: retired  Tobacco Use   Smoking status: Never   Smokeless tobacco: Never  Vaping Use   Vaping Use: Never used  Substance and Sexual Activity   Alcohol use: No   Drug use: No   Sexual activity: Not Currently

## 2020-12-10 ENCOUNTER — Other Ambulatory Visit: Payer: Self-pay

## 2020-12-10 DIAGNOSIS — E119 Type 2 diabetes mellitus without complications: Secondary | ICD-10-CM

## 2020-12-10 MED ORDER — ACCU-CHEK AVIVA PLUS W/DEVICE KIT: PACK | 3 refills | Status: AC

## 2020-12-17 DIAGNOSIS — N184 Chronic kidney disease, stage 4 (severe): Secondary | ICD-10-CM

## 2020-12-17 DIAGNOSIS — I2510 Atherosclerotic heart disease of native coronary artery without angina pectoris: Secondary | ICD-10-CM

## 2020-12-17 DIAGNOSIS — I1310 Hypertensive heart and chronic kidney disease without heart failure, with stage 1 through stage 4 chronic kidney disease, or unspecified chronic kidney disease: Secondary | ICD-10-CM

## 2020-12-17 DIAGNOSIS — M069 Rheumatoid arthritis, unspecified: Secondary | ICD-10-CM | POA: Diagnosis not present

## 2020-12-17 DIAGNOSIS — E1122 Type 2 diabetes mellitus with diabetic chronic kidney disease: Secondary | ICD-10-CM | POA: Diagnosis not present

## 2020-12-25 DIAGNOSIS — N184 Chronic kidney disease, stage 4 (severe): Secondary | ICD-10-CM | POA: Diagnosis not present

## 2020-12-26 ENCOUNTER — Other Ambulatory Visit: Payer: Self-pay

## 2020-12-26 ENCOUNTER — Encounter: Payer: Self-pay | Admitting: Physical Medicine and Rehabilitation

## 2020-12-26 ENCOUNTER — Ambulatory Visit: Payer: Self-pay

## 2020-12-26 ENCOUNTER — Ambulatory Visit (INDEPENDENT_AMBULATORY_CARE_PROVIDER_SITE_OTHER): Payer: Medicare HMO | Admitting: Physical Medicine and Rehabilitation

## 2020-12-26 DIAGNOSIS — M1611 Unilateral primary osteoarthritis, right hip: Secondary | ICD-10-CM

## 2020-12-26 NOTE — Progress Notes (Signed)
Pt right hip pain that travels down her right leg. Pt state walking, standing and laying down makes the pain worse. Pt state she takes over the counter pain meds and uses heat to help ease her pain.  Numeric Pain Rating Scale and Functional Assessment Average Pain 7   In the last MONTH (on 0-10 scale) has pain interfered with the following?  1. General activity like being  able to carry out your everyday physical activities such as walking, climbing stairs, carrying groceries, or moving a chair?  Rating(7)   -BT, -Dye Allergies.

## 2020-12-30 MED ORDER — TRIAMCINOLONE ACETONIDE 40 MG/ML IJ SUSP
60.0000 mg | INTRAMUSCULAR | Status: AC | PRN
  Administered 2020-12-26: 60 mg via INTRA_ARTICULAR

## 2020-12-30 MED ORDER — BUPIVACAINE HCL 0.25 % IJ SOLN
4.0000 mL | INTRAMUSCULAR | Status: AC | PRN
  Administered 2020-12-26: 4 mL via INTRA_ARTICULAR

## 2020-12-30 NOTE — Progress Notes (Signed)
   [name] - 87 y.o. female MRN [id number]  Date of birth: [birth year]  Office Visit Note: Visit Date: 12/26/2020 PCP: Glendale Chard, MD Referred by: Glendale Chard, MD  Subjective: No chief complaint on file.  HPI:  [name] is a 87 y.o. female who comes in today at the request of Dr. Eduard Roux for planned Right anesthetic hip arthrogram with fluoroscopic guidance.  The patient has failed conservative care including home exercise, medications, time and activity modification.  This injection will be diagnostic and hopefully therapeutic.  Please see requesting physician notes for further details and justification.  ROS Otherwise per HPI.  Assessment & Plan: Visit Diagnoses:    ICD-10-CM   1. Unilateral primary osteoarthritis, right hip  M16.11 XR C-ARM NO REPORT      Plan: No additional findings.   Meds & Orders: No orders of the defined types were placed in this encounter.   Orders Placed This Encounter  Procedures   Large Joint Inj   XR C-ARM NO REPORT    Follow-up: Return for visit to requesting provider as needed.   Procedures: Large Joint Inj: R hip joint on 12/26/2020 11:00 AM Indications: diagnostic evaluation and pain Details: 22 G 3.5 in needle, fluoroscopy-guided anterior approach  Arthrogram: No  Medications: 4 mL bupivacaine 0.25 %; 60 mg triamcinolone acetonide 40 MG/ML Outcome: tolerated well, no immediate complications  There was excellent flow of contrast producing a partial arthrogram of the hip. The patient did have relief of symptoms during the anesthetic phase of the injection. Procedure, treatment alternatives, risks and benefits explained, specific risks discussed. Consent was given by the patient. Immediately prior to procedure a time out was called to verify the correct patient, procedure, equipment, support staff and site/side marked as required. Patient was prepped and draped in the usual sterile fashion.         Clinical  History: No specialty comments available.     Objective:  VS:  HT:    WT:   BMI:     BP:   HR: bpm  TEMP: ( )  RESP:  Physical Exam   Imaging: No results found.

## 2021-01-04 DIAGNOSIS — D631 Anemia in chronic kidney disease: Secondary | ICD-10-CM | POA: Diagnosis not present

## 2021-01-04 DIAGNOSIS — E785 Hyperlipidemia, unspecified: Secondary | ICD-10-CM | POA: Diagnosis not present

## 2021-01-04 DIAGNOSIS — I129 Hypertensive chronic kidney disease with stage 1 through stage 4 chronic kidney disease, or unspecified chronic kidney disease: Secondary | ICD-10-CM | POA: Diagnosis not present

## 2021-01-04 DIAGNOSIS — N281 Cyst of kidney, acquired: Secondary | ICD-10-CM | POA: Diagnosis not present

## 2021-01-04 DIAGNOSIS — N2581 Secondary hyperparathyroidism of renal origin: Secondary | ICD-10-CM | POA: Diagnosis not present

## 2021-01-04 DIAGNOSIS — N184 Chronic kidney disease, stage 4 (severe): Secondary | ICD-10-CM | POA: Diagnosis not present

## 2021-01-04 DIAGNOSIS — R778 Other specified abnormalities of plasma proteins: Secondary | ICD-10-CM | POA: Diagnosis not present

## 2021-01-07 DIAGNOSIS — E1165 Type 2 diabetes mellitus with hyperglycemia: Secondary | ICD-10-CM | POA: Diagnosis not present

## 2021-01-21 ENCOUNTER — Telehealth: Payer: Medicare HMO

## 2021-01-21 ENCOUNTER — Ambulatory Visit (INDEPENDENT_AMBULATORY_CARE_PROVIDER_SITE_OTHER): Payer: Medicare HMO

## 2021-01-21 DIAGNOSIS — I2510 Atherosclerotic heart disease of native coronary artery without angina pectoris: Secondary | ICD-10-CM

## 2021-01-21 DIAGNOSIS — I739 Peripheral vascular disease, unspecified: Secondary | ICD-10-CM

## 2021-01-21 DIAGNOSIS — I1310 Hypertensive heart and chronic kidney disease without heart failure, with stage 1 through stage 4 chronic kidney disease, or unspecified chronic kidney disease: Secondary | ICD-10-CM

## 2021-01-21 DIAGNOSIS — E1122 Type 2 diabetes mellitus with diabetic chronic kidney disease: Secondary | ICD-10-CM

## 2021-01-21 DIAGNOSIS — M069 Rheumatoid arthritis, unspecified: Secondary | ICD-10-CM

## 2021-01-21 DIAGNOSIS — N184 Chronic kidney disease, stage 4 (severe): Secondary | ICD-10-CM

## 2021-01-21 NOTE — Chronic Care Management (AMB) (Signed)
Chronic Care Management   CCM RN Visit Note  01/21/2021 Name: [name] MRN: [id number] DOB: [birth year]  Subjective: [name] is a 87 y.o. year old female who is a primary care patient of Glendale Chard, MD. The care management team was consulted for assistance with disease management and care coordination needs.    Engaged with patient by telephone for follow up visit in response to provider referral for case management and/or care coordination services.   Consent to Services:  The patient was given information about Chronic Care Management services, agreed to services, and gave verbal consent prior to initiation of services.  Please see initial visit note for detailed documentation.   Patient agreed to services and verbal consent obtained.   Assessment: Review of patient past medical history, allergies, medications, health status, including review of consultants reports, laboratory and other test data, was performed as part of comprehensive evaluation and provision of chronic care management services.   SDOH (Social Determinants of Health) assessments and interventions performed:  Yes, see care plan   CCM Care Plan  No Known Allergies  Outpatient Encounter Medications as of 01/21/2021  Medication Sig   Accu-Chek Softclix Lancets lancets Use as instructed to check blood sugars up to 3 times a day  Dx code e11.65   acetaminophen (TYLENOL) 650 MG CR tablet Take 650 mg by mouth every 8 (eight) hours as needed for pain.   amLODipine (NORVASC) 5 MG tablet TAKE 1 TABLET(5 MG) BY MOUTH DAILY   Blood Glucose Calibration (ACCU-CHEK AVIVA) SOLN Use with accu-check aviva machine   Blood Glucose Monitoring Suppl (ACCU-CHEK AVIVA PLUS) w/Device KIT Use to check  Blood sugars up to 3 times a day.  Dx code e11.65   carvedilol (COREG) 12.5 MG tablet TAKE 1 TABLET(12.5 MG) BY MOUTH TWICE DAILY WITH A MEAL   chlorthalidone (HYGROTON) 25 MG tablet Take 12.5 mg by mouth daily.    Cholecalciferol (VITAMIN D) 50 MCG (2000 UT) CAPS Take by mouth.   dipyridamole-aspirin (AGGRENOX) 200-25 MG 12hr capsule Take 1 capsule by mouth daily.   glimepiride (AMARYL) 1 MG tablet TAKE 1/2 TABLET BY MOUTH ON MONDAYS, WEDNESDAYS, AND FRIDAYS   glucosamine-chondroitin 500-400 MG tablet Take 1 tablet by mouth 3 (three) times daily.   glucose blood test strip Use as instructed to check blood sugars up to 3 times a day.  Dx code e11.65   irbesartan (AVAPRO) 150 MG tablet Take 1 tablet (150 mg total) by mouth daily.   Multiple Vitamin (MULTIVITAMIN) capsule Take 1 capsule by mouth daily.   nitroGLYCERIN (NITROSTAT) 0.4 MG SL tablet PLACE 1 TABLET UNDER THE TONGUE AT THE FIRST SIGN OF ATTACK, MAY REPEAT EVERY 5 MINUTES FOR 3 DOSES IN 15 MINUTES. IF PAIN PERSISTS CALL 911   rosuvastatin (CRESTOR) 5 MG tablet One tab po M- F, skip Sat/Sun   No facility-administered encounter medications on file as of 01/21/2021.    Patient Active Problem List   Diagnosis Date Noted   Right leg pain 07/26/2020   Onycholysis 07/26/2020   CKD (chronic kidney disease) stage 4, GFR 15-29 ml/min ([place]) 07/19/2020   Hypertensive heart and renal disease 03/22/2020   Gout 12/07/2017   Type 2 diabetes mellitus with stage 3 chronic kidney disease, without long-term current use of insulin ([place]) 02/11/2014   History of coronary artery bypass graft 07/01/2010   MYOCARDIAL INFARCTION, INFERIOR WALL, SUBSEQUENT CARE 05/12/2008   CAD, NATIVE VESSEL 05/12/2008   HYPERLIPIDEMIA-MIXED 05/11/2008   Essential hypertension 05/11/2008  CAD, UNSPECIFIED SITE 05/11/2008   CEREBROVASCULAR DISEASE 05/11/2008   Rheumatoid arthritis ([place]) 05/11/2008    Conditions to be addressed/monitored: DM related to type 2 with stage 3 CKD, Rheumatoid Arthritis, HTN, PAD, CAD   Care Plan : RN Care Manager Plan of Care  Updates made by Lynne Logan, RN since 01/21/2021 12:00 AM     Problem: No plan of care established for management of  chronic disease states (DM related to type 2 with stage 3 CKD, Rheumatoid Arthritis, HTN, PAD, CAD)   Priority: High     Long-Range Goal: Development of plan of care for chronic disease management for DM related to type 2 with stage 3 CKD, Rheumatoid Arthritis, HTN, PAD, CAD   Start Date: 01/21/2021  Expected End Date: 01/21/2022  This Visit's Progress: On track  Priority: High  Note:   Current Barriers:  Knowledge Deficits related to plan of care for management of DM related to type 2 with stage 3 CKD, Rheumatoid Arthritis, HTN, PAD, CAD   Chronic Disease Management support and education needs related to DM related to type 2 with stage 3 CKD, Rheumatoid Arthritis, HTN, PAD, CAD    RNCM Clinical Goal(s):  Patient will verbalize basic understanding of  HTN and DMII disease process and self health management plan as evidenced by DM related to type 2 with stage 3 CKD, Rheumatoid Arthritis, HTN, PAD, CAD  take all medications exactly as prescribed and will call provider for medication related questions as evidenced by patient will report having no missed doses of her prescribed medications  demonstrate Ongoing health management independence as evidenced by will continue to maintain the ability to perform Self Care and Self Health management of her chronic conditions continue to work with RN Care Manager to address care management and care coordination needs related to  DM related to type 2 with stage 3 CKD, Rheumatoid Arthritis, HTN, PAD, CAD  as evidenced by adherence to CM Team Scheduled appointments demonstrate ongoing self health care management ability   as evidenced by    through collaboration with RN Care manager, provider, and care team.   Interventions: 1:1 collaboration with primary care provider regarding development and update of comprehensive plan of care as evidenced by provider attestation and co-signature Inter-disciplinary care team collaboration (see longitudinal plan of  care) Evaluation of current treatment plan related to  self management and patient's adherence to plan as established by provider   Chronic Kidney Disease Interventions:  (Status:  New goal.) Long Term Goal Assessed the Patient understanding of chronic kidney disease    Evaluation of current treatment plan related to chronic kidney disease self management and patient's adherence to plan as established by provider      Reviewed prescribed diet increase water up to 64 oz daily unless otherwise directed Provided education on kidney disease progression    Discussed plans with patient for ongoing care management follow up and provided patient with direct contact information for care management team Last practice recorded BP readings:  BP Readings from Last 3 Encounters:  11/14/20 130/62  08/28/20 (!) 168/70  07/19/20 124/60  Most recent eGFR/CrCl:  Lab Results  Component Value Date   EGFR 24 (L) 11/14/2020    No components found for: CRCL  Diabetes Interventions:  (Status:  Goal on track:  Yes.) Long Term Goal Assessed patient's understanding of A1c goal: <6.5% Provided education to patient about basic DM disease process Reviewed medications with patient and discussed importance of medication adherence Advised patient,  providing education and rationale, to check cbg daily before meals and at bedtime and record, calling PCP and or RN CM for findings outside established parameters Review of patient status, including review of consultants reports, relevant laboratory and other test results, and medications completed Discussed plans with patient for ongoing care management follow up and provided patient with direct contact information for care management team Lab Results  Component Value Date   HGBA1C 6.9 (H) 11/14/2020   Pain Interventions:  (Status:  Goal on track:  Yes.) Long Term Goal Pain assessment performed Medications reviewed Determined patient received injections to her right knee  and right hip with some effectiveness Assessed for falls since last RN encounter, patient denies Assessed for DME needs, patient is using her walker at all times Educated on fall prevention and provided tips to help avoid falls  Reviewed provider established plan for pain management Discussed importance of adherence to all scheduled medical appointments Counseled on the importance of reporting any/all new or changed pain symptoms or management strategies to pain management provider Advised patient to report to care team affect of pain on daily activities Reviewed with patient prescribed pharmacological and nonpharmacological pain relief strategies Assessed social determinant of health barriers  Determined patient is having difficulty performing light house keeping Sent in basket message to embedded BSW Daneen Schick requesting assistance for community resources for Senior help with housekeeping and transportation  Discussed plans with patient for ongoing care management follow up and provided patient with direct contact information for care management team  Hypertension Interventions:  (Status:  Goal on track:  Yes.) Long Term Goal Last practice recorded BP readings:  BP Readings from Last 3 Encounters:  11/14/20 130/62  08/28/20 (!) 168/70  07/19/20 124/60  Most recent eGFR/CrCl:  Lab Results  Component Value Date   EGFR 24 (L) 11/14/2020    No components found for: CRCL  Evaluation of current treatment plan related to hypertension self management and patient's adherence to plan as established by provider Reviewed medications with patient and discussed importance of compliance Advised patient, providing education and rationale, to monitor blood pressure daily and record, calling PCP for findings outside established parameters Provided education on prescribed diet low Sodium Discussed plans with patient for ongoing care management follow up and provided patient with direct contact  information for care management team  Patient Goals/Self-Care Activities: Take all medications as prescribed Attend all scheduled provider appointments Call pharmacy for medication refills 3-7 days in advance of running out of medications Attend church or other social activities Perform all self care activities independently  Perform IADL's (shopping, preparing meals, housekeeping, managing finances) independently Call provider office for new concerns or questions  Work with the social worker to address care coordination needs and will continue to work with the clinical team to address health care and disease management related needs drink 6 to 8 glasses of water each day manage portion size take medications for blood pressure exactly as prescribed  Follow Up Plan:  Telephone follow up appointment with care management team member scheduled for:  03/18/21     Plan:Telephone follow up appointment with care management team member scheduled for:  03/18/21  Barb Merino, RN, BSN, CCM Care Management Coordinator [facility]  Direct Phone: [phone]

## 2021-01-21 NOTE — Patient Instructions (Signed)
Visit Information   Thank you for taking time to visit with me today. Please don't hesitate to contact me if I can be of assistance to you before our next scheduled telephone appointment.  Following are the goals we discussed today:  (Copy and paste patient goals from clinical care plan here)  Our next appointment is by telephone on 03/18/20 at 12:35 PM   Please call the care guide team at [phone] if you need to cancel or reschedule your appointment.    Following is a copy of your full care plan:   Care Plan : [place] of Care  Updates made by Lynne Logan, RN since 01/21/2021 12:00 AM     Problem: No plan of care established for management of chronic disease states (DM related to type 2 with stage 3 CKD, Rheumatoid Arthritis, HTN, PAD, CAD)   Priority: High     Long-Range Goal: Development of plan of care for chronic disease management for DM related to type 2 with stage 3 CKD, Rheumatoid Arthritis, HTN, PAD, CAD   Start Date: 01/21/2021  Expected End Date: 01/21/2022  This Visit's Progress: On track  Priority: High  Note:   Current Barriers:  Knowledge Deficits related to plan of care for management of DM related to type 2 with stage 3 CKD, Rheumatoid Arthritis, HTN, PAD, CAD   Chronic Disease Management support and education needs related to DM related to type 2 with stage 3 CKD, Rheumatoid Arthritis, HTN, PAD, CAD    RNCM Clinical Goal(s):  Patient will verbalize basic understanding of  HTN and DMII disease process and self health management plan as evidenced by DM related to type 2 with stage 3 CKD, Rheumatoid Arthritis, HTN, PAD, CAD  take all medications exactly as prescribed and will call provider for medication related questions as evidenced by patient will report having no missed doses of her prescribed medications  demonstrate Ongoing health management independence as evidenced by will continue to maintain the ability to perform Self Care and Self Health  management of her chronic conditions continue to work with RN Care Manager to address care management and care coordination needs related to  DM related to type 2 with stage 3 CKD, Rheumatoid Arthritis, HTN, PAD, CAD  as evidenced by adherence to CM Team Scheduled appointments demonstrate ongoing self health care management ability   as evidenced by    through collaboration with RN Care manager, provider, and care team.   Interventions: 1:1 collaboration with primary care provider regarding development and update of comprehensive plan of care as evidenced by provider attestation and co-signature Inter-disciplinary care team collaboration (see longitudinal plan of care) Evaluation of current treatment plan related to  self management and patient's adherence to plan as established by provider   Chronic Kidney Disease Interventions:  (Status:  New goal.) Long Term Goal Assessed the Patient understanding of chronic kidney disease    Evaluation of current treatment plan related to chronic kidney disease self management and patient's adherence to plan as established by provider      Reviewed prescribed diet increase water up to 64 oz daily unless otherwise directed Provided education on kidney disease progression    Discussed plans with patient for ongoing care management follow up and provided patient with direct contact information for care management team Last practice recorded BP readings:  BP Readings from Last 3 Encounters:  11/14/20 130/62  08/28/20 (!) 168/70  07/19/20 124/60  Most recent eGFR/CrCl:  Lab Results  Component Value Date   EGFR 24 (L) 11/14/2020    No components found for: CRCL  Diabetes Interventions:  (Status:  Goal on track:  Yes.) Long Term Goal Assessed patient's understanding of A1c goal: <6.5% Provided education to patient about basic DM disease process Reviewed medications with patient and discussed importance of medication adherence Advised patient, providing  education and rationale, to check cbg daily before meals and at bedtime and record, calling PCP and or RN CM for findings outside established parameters Review of patient status, including review of consultants reports, relevant laboratory and other test results, and medications completed Discussed plans with patient for ongoing care management follow up and provided patient with direct contact information for care management team Lab Results  Component Value Date   HGBA1C 6.9 (H) 11/14/2020   Pain Interventions:  (Status:  Goal on track:  Yes.) Long Term Goal Pain assessment performed Medications reviewed Determined patient received injections to her right knee and right hip with some effectiveness Assessed for falls since last RN encounter, patient denies Assessed for DME needs, patient is using her walker at all times Educated on fall prevention and provided tips to help avoid falls  Reviewed provider established plan for pain management Discussed importance of adherence to all scheduled medical appointments Counseled on the importance of reporting any/all new or changed pain symptoms or management strategies to pain management provider Advised patient to report to care team affect of pain on daily activities Reviewed with patient prescribed pharmacological and nonpharmacological pain relief strategies Assessed social determinant of health barriers  Determined patient is having difficulty performing light house keeping Sent in basket message to embedded BSW Daneen Schick requesting assistance for community resources for Senior help with housekeeping and transportation  Discussed plans with patient for ongoing care management follow up and provided patient with direct contact information for care management team  Hypertension Interventions:  (Status:  Goal on track:  Yes.) Long Term Goal Last practice recorded BP readings:  BP Readings from Last 3 Encounters:  11/14/20 130/62  08/28/20  (!) 168/70  07/19/20 124/60  Most recent eGFR/CrCl:  Lab Results  Component Value Date   EGFR 24 (L) 11/14/2020    No components found for: CRCL  Evaluation of current treatment plan related to hypertension self management and patient's adherence to plan as established by provider Reviewed medications with patient and discussed importance of compliance Advised patient, providing education and rationale, to monitor blood pressure daily and record, calling PCP for findings outside established parameters Provided education on prescribed diet low Sodium Discussed plans with patient for ongoing care management follow up and provided patient with direct contact information for care management team  Patient Goals/Self-Care Activities: Take all medications as prescribed Attend all scheduled provider appointments Call pharmacy for medication refills 3-7 days in advance of running out of medications Attend church or other social activities Perform all self care activities independently  Perform IADL's (shopping, preparing meals, housekeeping, managing finances) independently Call provider office for new concerns or questions  Work with the social worker to address care coordination needs and will continue to work with the clinical team to address health care and disease management related needs drink 6 to 8 glasses of water each day manage portion size take medications for blood pressure exactly as prescribed  Follow Up Plan:  Telephone follow up appointment with care management team member scheduled for:  03/18/21      Consent to CCM Services: Ms. Faulkenberry was given information about  Chronic Care Management services including:  CCM service includes personalized support from designated clinical staff supervised by her physician, including individualized plan of care and coordination with other care providers 24/7 contact phone numbers for assistance for urgent and routine care  needs. Service will only be billed when office clinical staff spend 20 minutes or more in a month to coordinate care. Only one practitioner may furnish and bill the service in a calendar month. The patient may stop CCM services at any time (effective at the end of the month) by phone call to the office staff. The patient will be responsible for cost sharing (co-pay) of up to 20% of the service fee (after annual deductible is met).  Patient agreed to services and verbal consent obtained.   The patient verbalized understanding of instructions, educational materials, and care plan provided today and declined offer to receive copy of patient instructions, educational materials, and care plan.   Telephone follow up appointment with care management team member scheduled for:

## 2021-01-24 ENCOUNTER — Ambulatory Visit: Payer: Medicare HMO

## 2021-01-24 DIAGNOSIS — I739 Peripheral vascular disease, unspecified: Secondary | ICD-10-CM

## 2021-01-24 DIAGNOSIS — E1122 Type 2 diabetes mellitus with diabetic chronic kidney disease: Secondary | ICD-10-CM

## 2021-01-24 DIAGNOSIS — N184 Chronic kidney disease, stage 4 (severe): Secondary | ICD-10-CM

## 2021-01-24 DIAGNOSIS — I1310 Hypertensive heart and chronic kidney disease without heart failure, with stage 1 through stage 4 chronic kidney disease, or unspecified chronic kidney disease: Secondary | ICD-10-CM

## 2021-01-24 DIAGNOSIS — M069 Rheumatoid arthritis, unspecified: Secondary | ICD-10-CM

## 2021-01-24 NOTE — Patient Instructions (Signed)
Social Worker Visit Information  Goals we discussed today:   Goals Addressed             This Visit's Progress    Transportation Resources Identified       Timeframe:  Short-Term Goal Priority:  High Start Date: 12.8.22                                             Next planned outreach: 1.5.23  Patient Goals/Self-Care Activities patient will:   - Work with SW to obtain transportation resources         Materials Provided: Verbal education about transportation resources provided by phone  The patient verbalized understanding of instructions, educational materials, and care plan provided today and declined offer to receive copy of patient instructions, Scientist, clinical (histocompatibility and immunogenetics), and care plan.   Follow Up Plan: SW will follow up with patient by phone over the next month   Daneen Schick, BSW, CDP Social Worker, Certified Dementia Practitioner [place] [facility] [phone]

## 2021-01-24 NOTE — Chronic Care Management (AMB) (Signed)
Chronic Care Management    Social Work Note  01/24/2021 Name: [name] MRN: [id number] DOB: 1927-02-21  [name] is a 87 y.o. year old female who is a primary care patient of Glendale Chard, MD. The CCM team was consulted to assist the patient with chronic disease management and/or care coordination needs related to:  DM II, CKD III, RA, HTN, PAD, CAD .   Engaged with patient by telephone for follow up visit in response to provider referral for social work chronic care management and care coordination services.   Consent to Services:  The patient was given information about Chronic Care Management services, agreed to services, and gave verbal consent prior to initiation of services.  Please see initial visit note for detailed documentation.   Patient agreed to services and consent obtained.   Assessment: Review of patient past medical history, allergies, medications, and health status, including review of relevant consultants reports was performed today as part of a comprehensive evaluation and provision of chronic care management and care coordination services.     SDOH (Social Determinants of Health) assessments and interventions performed:    Advanced Directives Status: Not addressed in this encounter.  CCM Care Plan  No Known Allergies  Outpatient Encounter Medications as of 01/24/2021  Medication Sig   Accu-Chek Softclix Lancets lancets Use as instructed to check blood sugars up to 3 times a day  Dx code e11.65   acetaminophen (TYLENOL) 650 MG CR tablet Take 650 mg by mouth every 8 (eight) hours as needed for pain.   amLODipine (NORVASC) 5 MG tablet TAKE 1 TABLET(5 MG) BY MOUTH DAILY   Blood Glucose Calibration (ACCU-CHEK AVIVA) SOLN Use with accu-check aviva machine   Blood Glucose Monitoring Suppl (ACCU-CHEK AVIVA PLUS) w/Device KIT Use to check  Blood sugars up to 3 times a day.  Dx code e11.65   carvedilol (COREG) 12.5 MG tablet TAKE 1 TABLET(12.5 MG) BY  MOUTH TWICE DAILY WITH A MEAL   chlorthalidone (HYGROTON) 25 MG tablet Take 12.5 mg by mouth daily.   Cholecalciferol (VITAMIN D) 50 MCG (2000 UT) CAPS Take by mouth.   dipyridamole-aspirin (AGGRENOX) 200-25 MG 12hr capsule Take 1 capsule by mouth daily.   glimepiride (AMARYL) 1 MG tablet TAKE 1/2 TABLET BY MOUTH ON MONDAYS, WEDNESDAYS, AND FRIDAYS   glucosamine-chondroitin 500-400 MG tablet Take 1 tablet by mouth 3 (three) times daily.   glucose blood test strip Use as instructed to check blood sugars up to 3 times a day.  Dx code e11.65   irbesartan (AVAPRO) 150 MG tablet Take 1 tablet (150 mg total) by mouth daily.   Multiple Vitamin (MULTIVITAMIN) capsule Take 1 capsule by mouth daily.   nitroGLYCERIN (NITROSTAT) 0.4 MG SL tablet PLACE 1 TABLET UNDER THE TONGUE AT THE FIRST SIGN OF ATTACK, MAY REPEAT EVERY 5 MINUTES FOR 3 DOSES IN 15 MINUTES. IF PAIN PERSISTS CALL 911   rosuvastatin (CRESTOR) 5 MG tablet One tab po M- F, skip Sat/Sun   No facility-administered encounter medications on file as of 01/24/2021.    Patient Active Problem List   Diagnosis Date Noted   Right leg pain 07/26/2020   Onycholysis 07/26/2020   CKD (chronic kidney disease) stage 4, GFR 15-29 ml/min ([place]) 07/19/2020   Hypertensive heart and renal disease 03/22/2020   Gout 12/07/2017   Type 2 diabetes mellitus with stage 3 chronic kidney disease, without long-term current use of insulin ([place]) 02/11/2014   History of coronary artery bypass graft 07/01/2010   MYOCARDIAL  INFARCTION, INFERIOR WALL, SUBSEQUENT CARE 05/12/2008   CAD, NATIVE VESSEL 05/12/2008   HYPERLIPIDEMIA-MIXED 05/11/2008   Essential hypertension 05/11/2008   CAD, UNSPECIFIED SITE 05/11/2008   CEREBROVASCULAR DISEASE 05/11/2008   Rheumatoid arthritis ([place]) 05/11/2008    Conditions to be addressed/monitored: CAD, HTN, DMII, CKD Stage III, and RA ; Transportation  Care Plan : Social Work Plan of Care  Updates made by Daneen Schick since  01/24/2021 12:00 AM     Problem: Mobility and Independence      Goal: Transportation Resources Identified   Start Date: 01/24/2021  Priority: High  Note:   Current Barriers:  Chronic disease management support and education needs related to  DM II, CKD III, RA, HTN, PAD, CAD   Limited knowledge of transportation resources available when her son is unable to assist with transportation  Education officer, museum Clinical Goal(s):  patient will work with SW to identify and address any acute and/or chronic care coordination needs related to the self health management of  DM II, CKD III, RA, HTN, PAD, CAD   patient will work with SW to address concerns related to transportation  SW Interventions:  Inter-disciplinary care team collaboration (see longitudinal plan of care) Collaboration with Glendale Chard, MD regarding development and update of comprehensive plan of care as evidenced by provider attestation and co-signature Collaboration with RN Care Manager who requests SW contact the patient to assist with care coordination needs Telephonic visit completed with the patient who indicates she is interested in transportation resources to get her to physician appointments when her son is unable to assist Performed chart review to note patient has a state health plan which does not currently offer transportation Educated the patient on [place] - patient reports she does not feel this is a doable option as she needs hands on assistance when ambulating Discussed patient currently uses a walker but often needs assistance during ambulation Educated the patient on the [employer] transportation program Discussed plans for SW to contact the patient in January to refer her to this program considering she does not have any upcomming appointments  Patient Goals/Self-Care Activities patient will:   -  Work with SW to obtain transportation resources  Follow Up Plan: Telephone follow up appointment with care  management team member scheduled for:1.5.23       Follow Up Plan: SW will follow up with patient by phone over the next month      Daneen Schick, BSW, CDP Social Worker, Certified Dementia Practitioner [place] [facility] [phone]

## 2021-01-29 ENCOUNTER — Other Ambulatory Visit: Payer: Self-pay | Admitting: Internal Medicine

## 2021-01-29 DIAGNOSIS — I2510 Atherosclerotic heart disease of native coronary artery without angina pectoris: Secondary | ICD-10-CM

## 2021-01-29 DIAGNOSIS — N183 Chronic kidney disease, stage 3 unspecified: Secondary | ICD-10-CM

## 2021-02-12 DIAGNOSIS — N184 Chronic kidney disease, stage 4 (severe): Secondary | ICD-10-CM | POA: Diagnosis not present

## 2021-02-13 DIAGNOSIS — I10 Essential (primary) hypertension: Secondary | ICD-10-CM | POA: Diagnosis not present

## 2021-02-13 DIAGNOSIS — Z0100 Encounter for examination of eyes and vision without abnormal findings: Secondary | ICD-10-CM | POA: Diagnosis not present

## 2021-02-13 DIAGNOSIS — H5200 Hypermetropia, unspecified eye: Secondary | ICD-10-CM | POA: Diagnosis not present

## 2021-02-13 DIAGNOSIS — E109 Type 1 diabetes mellitus without complications: Secondary | ICD-10-CM | POA: Diagnosis not present

## 2021-02-13 LAB — HM DIABETES EYE EXAM

## 2021-02-16 DIAGNOSIS — N184 Chronic kidney disease, stage 4 (severe): Secondary | ICD-10-CM

## 2021-02-16 DIAGNOSIS — M069 Rheumatoid arthritis, unspecified: Secondary | ICD-10-CM | POA: Diagnosis not present

## 2021-02-16 DIAGNOSIS — I1310 Hypertensive heart and chronic kidney disease without heart failure, with stage 1 through stage 4 chronic kidney disease, or unspecified chronic kidney disease: Secondary | ICD-10-CM

## 2021-02-16 DIAGNOSIS — I2510 Atherosclerotic heart disease of native coronary artery without angina pectoris: Secondary | ICD-10-CM | POA: Diagnosis not present

## 2021-02-16 DIAGNOSIS — E1122 Type 2 diabetes mellitus with diabetic chronic kidney disease: Secondary | ICD-10-CM | POA: Diagnosis not present

## 2021-02-21 ENCOUNTER — Ambulatory Visit (INDEPENDENT_AMBULATORY_CARE_PROVIDER_SITE_OTHER): Payer: Medicare HMO

## 2021-02-21 DIAGNOSIS — I1310 Hypertensive heart and chronic kidney disease without heart failure, with stage 1 through stage 4 chronic kidney disease, or unspecified chronic kidney disease: Secondary | ICD-10-CM

## 2021-02-21 DIAGNOSIS — E1122 Type 2 diabetes mellitus with diabetic chronic kidney disease: Secondary | ICD-10-CM

## 2021-02-21 DIAGNOSIS — I739 Peripheral vascular disease, unspecified: Secondary | ICD-10-CM

## 2021-02-21 NOTE — Patient Instructions (Signed)
Social Worker Visit Information  Goals we discussed today:   Goals Addressed             This Visit's Progress    COMPLETED: Transportation Resources Identified       Timeframe:  Short-Term Goal Priority:  High Start Date: 12.8.22                                             Patient Goals/Self-Care Activities patient will:  - Utilize her son for transportation needs         Materials Provided: No: Patient declined  The patient verbalized understanding of instructions, educational materials, and care plan provided today and declined offer to receive copy of patient instructions, Scientist, clinical (histocompatibility and immunogenetics), and care plan.   Follow Up Plan:  No SW follow up planned at this time. Please contact me as needed.   Daneen Schick, BSW, CDP Social Worker, Certified Dementia Practitioner [place] [facility] [phone]

## 2021-02-21 NOTE — Chronic Care Management (AMB) (Signed)
Chronic Care Management    Social Work Note  02/21/2021 Name: [name] MRN: [id number] DOB: 13-Jul-1927  [name] is a 88 y.o. year old female who is a primary care patient of Glendale Chard, MD. The CCM team was consulted to assist the patient with chronic disease management and/or care coordination needs related to:  DM II, HTN, CKD III, PAD .   Engaged with patient by telephone for follow up visit in response to provider referral for social work chronic care management and care coordination services.   Consent to Services:  The patient was given information about Chronic Care Management services, agreed to services, and gave verbal consent prior to initiation of services.  Please see initial visit note for detailed documentation.   Patient agreed to services and consent obtained.   Assessment: Review of patient past medical history, allergies, medications, and health status, including review of relevant consultants reports was performed today as part of a comprehensive evaluation and provision of chronic care management and care coordination services.     SDOH (Social Determinants of Health) assessments and interventions performed:    Advanced Directives Status: Not addressed in this encounter.  CCM Care Plan  No Known Allergies  Outpatient Encounter Medications as of 02/21/2021  Medication Sig   Accu-Chek Softclix Lancets lancets Use as instructed to check blood sugars up to 3 times a day  Dx code e11.65   acetaminophen (TYLENOL) 650 MG CR tablet Take 650 mg by mouth every 8 (eight) hours as needed for pain.   amLODipine (NORVASC) 5 MG tablet TAKE 1 TABLET(5 MG) BY MOUTH DAILY   Blood Glucose Calibration (ACCU-CHEK AVIVA) SOLN Use with accu-check aviva machine   Blood Glucose Monitoring Suppl (ACCU-CHEK AVIVA PLUS) w/Device KIT Use to check  Blood sugars up to 3 times a day.  Dx code e11.65   carvedilol (COREG) 12.5 MG tablet TAKE 1 TABLET(12.5 MG) BY MOUTH TWICE  DAILY WITH A MEAL   chlorthalidone (HYGROTON) 25 MG tablet Take 12.5 mg by mouth daily.   Cholecalciferol (VITAMIN D) 50 MCG (2000 UT) CAPS Take by mouth.   dipyridamole-aspirin (AGGRENOX) 200-25 MG 12hr capsule Take 1 capsule by mouth daily.   glimepiride (AMARYL) 1 MG tablet TAKE 1/2 TABLET BY MOUTH ON MONDAYS, WEDNESDAYS, AND FRIDAYS   glucosamine-chondroitin 500-400 MG tablet Take 1 tablet by mouth 3 (three) times daily.   glucose blood test strip Use as instructed to check blood sugars up to 3 times a day.  Dx code e11.65   irbesartan (AVAPRO) 150 MG tablet Take 1 tablet (150 mg total) by mouth daily.   Multiple Vitamin (MULTIVITAMIN) capsule Take 1 capsule by mouth daily.   nitroGLYCERIN (NITROSTAT) 0.4 MG SL tablet PLACE 1 TABLET UNDER THE TONGUE AT THE FIRST SIGN OF ATTACK, MAY REPEAT EVERY 5 MINUTES FOR 3 DOSES IN 15 MINUTES. IF PAIN PERSISTS CALL 911   rosuvastatin (CRESTOR) 5 MG tablet TAKE 1 TABLET BY MOUTH MONDAY THROUGH FRIDAY. DO NOT TAKE ON SATURDAY AND SUNDAY   No facility-administered encounter medications on file as of 02/21/2021.    Patient Active Problem List   Diagnosis Date Noted   Right leg pain 07/26/2020   Onycholysis 07/26/2020   CKD (chronic kidney disease) stage 4, GFR 15-29 ml/min ([place]) 07/19/2020   Hypertensive heart and renal disease 03/22/2020   Gout 12/07/2017   Type 2 diabetes mellitus with stage 3 chronic kidney disease, without long-term current use of insulin ([place]) 02/11/2014   History of coronary artery  bypass graft 07/01/2010   MYOCARDIAL INFARCTION, INFERIOR WALL, SUBSEQUENT CARE 05/12/2008   CAD, NATIVE VESSEL 05/12/2008   HYPERLIPIDEMIA-MIXED 05/11/2008   Essential hypertension 05/11/2008   CAD, UNSPECIFIED SITE 05/11/2008   CEREBROVASCULAR DISEASE 05/11/2008   Rheumatoid arthritis ([place]) 05/11/2008    Conditions to be addressed/monitored: HTN, DMII, and CKD Stage III ; Transportation  Care Plan : Social Work Plan of Care  Updates made by  Daneen Schick since 02/21/2021 12:00 AM  Completed 02/21/2021   Problem: Mobility and Independence Resolved 02/21/2021     Goal: Transportation Resources Identified Completed 02/21/2021  Start Date: 01/24/2021  Priority: High  Note:   Current Barriers:  Chronic disease management support and education needs related to  DM II, CKD III, RA, HTN, PAD, CAD   Limited knowledge of transportation resources available when her son is unable to assist with transportation  Education officer, museum Clinical Goal(s):  patient will work with SW to identify and address any acute and/or chronic care coordination needs related to the self health management of  DM II, CKD III, RA, HTN, PAD, CAD   patient will work with SW to address concerns related to transportation  SW Interventions:  Inter-disciplinary care team collaboration (see longitudinal plan of care) Collaboration with Glendale Chard, MD regarding development and update of comprehensive plan of care as evidenced by provider attestation and co-signature Collaboration with RN Care Manager who requests SW contact the patient to assist with care coordination needs Telephonic visit completed with the patient to follow up on resource needs Reviewed the patient continues to not be interested in SCAT services at this time Assessed for transportation need to future appointments - patient indicates her son will drive her Discussed plans for SW to perform discipline closure and encouraged the patient to contact her primary care provider if she needs future SW assistance  Patient Goals/Self-Care Activities patient will:   -  Utilize her son for transportation needs         Follow Up Plan:  No SW follow up planned at this time. The patient will remain engaged with RN Care Manager to address care management needs.      Daneen Schick, BSW, CDP Social Worker, Certified Dementia Practitioner [place] [facility] [phone]

## 2021-02-28 ENCOUNTER — Encounter: Payer: Self-pay | Admitting: Sports Medicine

## 2021-02-28 ENCOUNTER — Other Ambulatory Visit: Payer: Self-pay

## 2021-02-28 ENCOUNTER — Ambulatory Visit (INDEPENDENT_AMBULATORY_CARE_PROVIDER_SITE_OTHER): Payer: Medicare PPO | Admitting: Sports Medicine

## 2021-02-28 DIAGNOSIS — Z8673 Personal history of transient ischemic attack (TIA), and cerebral infarction without residual deficits: Secondary | ICD-10-CM

## 2021-02-28 DIAGNOSIS — B351 Tinea unguium: Secondary | ICD-10-CM | POA: Diagnosis not present

## 2021-02-28 DIAGNOSIS — M79609 Pain in unspecified limb: Secondary | ICD-10-CM | POA: Diagnosis not present

## 2021-02-28 DIAGNOSIS — R531 Weakness: Secondary | ICD-10-CM

## 2021-02-28 DIAGNOSIS — E1122 Type 2 diabetes mellitus with diabetic chronic kidney disease: Secondary | ICD-10-CM

## 2021-02-28 DIAGNOSIS — N183 Chronic kidney disease, stage 3 unspecified: Secondary | ICD-10-CM

## 2021-02-28 NOTE — Progress Notes (Signed)
Subjective: [name] is a 88 y.o. female patient with history of diabetes who presents to office today complaining of long,mildly painful nails  while ambulating in shoes; unable to trim.  History of stroke in 2003. States that her right side feel weak and that she never got a brace, Ortho gave her a shot in her hip and knee that helped some.  Fasting blood sugar not recorded.   Patient Active Problem List   Diagnosis Date Noted   Right leg pain 07/26/2020   Onycholysis 07/26/2020   CKD (chronic kidney disease) stage 4, GFR 15-29 ml/min ([facility]) 07/19/2020   Hypertensive heart and renal disease 03/22/2020   Gout 12/07/2017   Type 2 diabetes mellitus with stage 3 chronic kidney disease, without long-term current use of insulin ([place]) 02/11/2014   History of coronary artery bypass graft 07/01/2010   MYOCARDIAL INFARCTION, INFERIOR WALL, SUBSEQUENT CARE 05/12/2008   CAD, NATIVE VESSEL 05/12/2008   HYPERLIPIDEMIA-MIXED 05/11/2008   Essential hypertension 05/11/2008   CAD, UNSPECIFIED SITE 05/11/2008   CEREBROVASCULAR DISEASE 05/11/2008   Rheumatoid arthritis ([place]) 05/11/2008   Current Outpatient Medications on File Prior to Visit  Medication Sig Dispense Refill   Accu-Chek Softclix Lancets lancets Use as instructed to check blood sugars up to 3 times a day  Dx code e11.65 100 each 12   acetaminophen (TYLENOL) 650 MG CR tablet Take 650 mg by mouth every 8 (eight) hours as needed for pain.     amLODipine (NORVASC) 5 MG tablet TAKE 1 TABLET(5 MG) BY MOUTH DAILY 90 tablet 2   Blood Glucose Calibration (ACCU-CHEK AVIVA) SOLN Use with accu-check aviva machine 1 each 3   Blood Glucose Monitoring Suppl (ACCU-CHEK AVIVA PLUS) w/Device KIT Use to check  Blood sugars up to 3 times a day.  Dx code e11.65 1 kit 3   carvedilol (COREG) 12.5 MG tablet TAKE 1 TABLET(12.5 MG) BY MOUTH TWICE DAILY WITH A MEAL 180 tablet 1   chlorthalidone (HYGROTON) 25 MG tablet Take 12.5 mg by mouth daily.      Cholecalciferol (VITAMIN D) 50 MCG (2000 UT) CAPS Take by mouth.     dipyridamole-aspirin (AGGRENOX) 200-25 MG 12hr capsule Take 1 capsule by mouth daily. 90 capsule 1   glimepiride (AMARYL) 1 MG tablet TAKE 1/2 TABLET BY MOUTH ON MONDAYS, WEDNESDAYS, AND FRIDAYS 36 tablet 1   glucosamine-chondroitin 500-400 MG tablet Take 1 tablet by mouth 3 (three) times daily.     glucose blood test strip Use as instructed to check blood sugars up to 3 times a day.  Dx code e11.65 100 each 12   irbesartan (AVAPRO) 150 MG tablet Take 1 tablet (150 mg total) by mouth daily. 90 tablet 2   Multiple Vitamin (MULTIVITAMIN) capsule Take 1 capsule by mouth daily.     nitroGLYCERIN (NITROSTAT) 0.4 MG SL tablet PLACE 1 TABLET UNDER THE TONGUE AT THE FIRST SIGN OF ATTACK, MAY REPEAT EVERY 5 MINUTES FOR 3 DOSES IN 15 MINUTES. IF PAIN PERSISTS CALL 911 50 tablet 0   rosuvastatin (CRESTOR) 5 MG tablet TAKE 1 TABLET BY MOUTH MONDAY THROUGH FRIDAY. DO NOT TAKE ON SATURDAY AND SUNDAY 90 tablet 1   No current facility-administered medications on file prior to visit.   No Known Allergies  No results found for this or any previous visit (from the past 2160 hour(s)).   Objective: General: Patient is awake, alert, and oriented x 3 and in no acute distress.  Integument: Skin is warm, dry and supple bilateral. Nails  are tender, long, thickened and dystrophic with subungual debris, consistent with onychomycosis, 1-5 bilateral. No signs of infection. No open lesions or preulcerative lesions present bilateral. Remaining integument unremarkable.  Vasculature:  Dorsalis Pedis pulse 1/4 bilateral. Posterior Tibial pulse 1/4 bilateral. Capillary fill time <5 sec 1-5 bilateral. Positive hair growth to the level of the digits.Temperature gradient within normal limits. No varicosities present bilateral.  Trace edema to the right lower extremity.  Neurology: The patient has diminished sensation measured with a 5.07/10g Semmes Weinstein  Monofilament at all pedal sites bilateral . Vibratory sensation diminished bilateral with tuning fork.   Musculoskeletal: Asymptomatic pes planus pedal deformities noted bilateral.  There is limited range of motion at the right foot and ankle with significant weakness 4 out of 5 consistent with history of stroke on the right.  Assessment and Plan: Problem List Items Addressed This Visit       Endocrine   Type 2 diabetes mellitus with stage 3 chronic kidney disease, without long-term current use of insulin ([place])   Other Visit Diagnoses     Pain due to onychomycosis of nail    -  Primary   Right sided weakness       History of stroke           -Examined patient. -Re-Discussed and educated patient on diabetic foot care, especially with  regards to the vascular, neurological and musculoskeletal systems.  -Mechanically debrided all nails 1-5 bilateral using sterile nail nipper and filed with dremel without incident  -Patient to see Aaron Edelman to evaluate her for a custom brace on the right -Answered all patient questions -Patient to return  in 3 months for at risk foot care -Patient advised to call the office if any problems or questions arise in the meantime.  Landis Martins, DPM

## 2021-03-12 ENCOUNTER — Other Ambulatory Visit: Payer: Self-pay | Admitting: Internal Medicine

## 2021-03-12 DIAGNOSIS — I2510 Atherosclerotic heart disease of native coronary artery without angina pectoris: Secondary | ICD-10-CM

## 2021-03-14 ENCOUNTER — Ambulatory Visit (INDEPENDENT_AMBULATORY_CARE_PROVIDER_SITE_OTHER): Payer: Medicare PPO

## 2021-03-14 ENCOUNTER — Ambulatory Visit: Payer: Medicare PPO | Admitting: Internal Medicine

## 2021-03-14 ENCOUNTER — Encounter: Payer: Self-pay | Admitting: Internal Medicine

## 2021-03-14 ENCOUNTER — Other Ambulatory Visit: Payer: Self-pay

## 2021-03-14 VITALS — BP 128/60 | HR 67 | Temp 98.2°F | Ht 64.0 in | Wt 136.0 lb

## 2021-03-14 DIAGNOSIS — E1122 Type 2 diabetes mellitus with diabetic chronic kidney disease: Secondary | ICD-10-CM

## 2021-03-14 DIAGNOSIS — M069 Rheumatoid arthritis, unspecified: Secondary | ICD-10-CM | POA: Diagnosis not present

## 2021-03-14 DIAGNOSIS — Z0000 Encounter for general adult medical examination without abnormal findings: Secondary | ICD-10-CM | POA: Diagnosis not present

## 2021-03-14 DIAGNOSIS — I1310 Hypertensive heart and chronic kidney disease without heart failure, with stage 1 through stage 4 chronic kidney disease, or unspecified chronic kidney disease: Secondary | ICD-10-CM | POA: Diagnosis not present

## 2021-03-14 DIAGNOSIS — Z23 Encounter for immunization: Secondary | ICD-10-CM

## 2021-03-14 DIAGNOSIS — I739 Peripheral vascular disease, unspecified: Secondary | ICD-10-CM | POA: Diagnosis not present

## 2021-03-14 DIAGNOSIS — N184 Chronic kidney disease, stage 4 (severe): Secondary | ICD-10-CM

## 2021-03-14 MED ORDER — BOOSTRIX 5-2.5-18.5 LF-MCG/0.5 IM SUSP: 0.5000 mL | Freq: Once | INTRAMUSCULAR | 0 refills | Status: AC

## 2021-03-14 NOTE — Progress Notes (Signed)
This visit occurred during the SARS-CoV-2 public health emergency.  Safety protocols were in place, including screening questions prior to the visit, additional usage of staff PPE, and extensive cleaning of exam room while observing appropriate contact time as indicated for disinfecting solutions.  Subjective:   [name] is a 88 y.o. female who presents for Medicare Annual (Subsequent) preventive examination.  Review of Systems     Cardiac Risk Factors include: advanced age (>9mn, >>36women);diabetes mellitus;dyslipidemia;hypertension     Objective:    Today's Vitals   03/14/21 1405  BP: 128/60  Pulse: 67  Temp: 98.2 F (36.8 C)  TempSrc: Oral  SpO2: 97%  Weight: 136 lb (61.7 kg)  Height: _0  (1.626 m)   Body mass index is 23.34 kg/m.  Advanced Directives 03/14/2021 03/08/2020 11/09/2019 09/16/2018  Does Patient Have a Medical Advance Directive? Yes Yes No Yes  Type of AParamedicof [place] will [place] will - [place]  Copy of [place] Chart? No - copy requested No - copy requested - No - copy requested    Current Medications (verified) Outpatient Encounter Medications as of 03/14/2021  Medication Sig   Accu-Chek Softclix Lancets lancets Use as instructed to check blood sugars up to 3 times a day  Dx code e11.65   acetaminophen (TYLENOL) 650 MG CR tablet Take 650 mg by mouth every 8 (eight) hours as needed for pain.   amLODipine (NORVASC) 5 MG tablet TAKE 1 TABLET(5 MG) BY MOUTH DAILY   Blood Glucose Calibration (ACCU-CHEK AVIVA) SOLN Use with accu-check aviva machine   Blood Glucose Monitoring Suppl (ACCU-CHEK AVIVA PLUS) w/Device KIT Use to check  Blood sugars up to 3 times a day.  Dx code e11.65   carvedilol (COREG) 12.5 MG tablet TAKE 1 TABLET(12.5 MG) BY MOUTH TWICE DAILY WITH A MEAL   chlorthalidone (HYGROTON) 25 MG tablet Take 12.5 mg by mouth  daily.   Cholecalciferol (VITAMIN D) 50 MCG (2000 UT) CAPS Take by mouth.   clindamycin (CLEOCIN) 150 MG capsule For dentist appointments   dipyridamole-aspirin (AGGRENOX) 200-25 MG 12hr capsule TAKE 1 CAPSULE BY MOUTH DAILY   glimepiride (AMARYL) 1 MG tablet TAKE 1/2 TABLET BY MOUTH ON MONDAYS, WEDNESDAYS, AND FRIDAYS   glucosamine-chondroitin 500-400 MG tablet Take 1 tablet by mouth 3 (three) times daily.   glucose blood test strip Use as instructed to check blood sugars up to 3 times a day.  Dx code e11.65   irbesartan (AVAPRO) 150 MG tablet Take 1 tablet (150 mg total) by mouth daily.   JARDIANCE 10 MG TABS tablet Take 10 mg by mouth daily.   Multiple Vitamin (MULTIVITAMIN) capsule Take 1 capsule by mouth daily.   nitroGLYCERIN (NITROSTAT) 0.4 MG SL tablet PLACE 1 TABLET UNDER THE TONGUE AT THE FIRST SIGN OF ATTACK, MAY REPEAT EVERY 5 MINUTES FOR 3 DOSES IN 15 MINUTES. IF PAIN PERSISTS CALL 911   rosuvastatin (CRESTOR) 5 MG tablet TAKE 1 TABLET BY MOUTH MONDAY THROUGH FRIDAY. DO NOT TAKE ON SATURDAY AND SUNDAY   No facility-administered encounter medications on file as of 03/14/2021.    Allergies (verified) Patient has no known allergies.   History: Past Medical History:  Diagnosis Date   Cerebrovascular disease, unspecified    Coronary atherosclerosis of unspecified type of vessel, native or graft    Diabetes mellitus without complication ([place]    Other and unspecified hyperlipidemia    Rheumatoid arthritis(714.0)  Shingles 2003   Unspecified essential hypertension    Past Surgical History:  Procedure Laterality Date   Cardiac Bypass  2003   CATARACT EXTRACTION, BILATERAL  2007   Family History  Problem Relation Age of Onset   Cancer Mother    Hypertension Father    Cancer Brother    Hypertension Paternal Grandmother    Heart disease Brother    Diabetes Neg Hx    Coronary artery disease Neg Hx    Social History   Socioeconomic History   Marital status: Widowed     Spouse name: Not on file   Number of children: Not on file   Years of education: Not on file   Highest education level: Not on file  Occupational History   Occupation: retired  Tobacco Use   Smoking status: Never   Smokeless tobacco: Never  Vaping Use   Vaping Use: Never used  Substance and Sexual Activity   Alcohol use: No   Drug use: No   Sexual activity: Not Currently  Other Topics Concern   Not on file  Social History Narrative   Retired   Tobacco use- no   Alcohol use-no         Social Determinants of Radio broadcast assistant Strain: Low Risk    Difficulty of Paying Living Expenses: Not hard at all  Food Insecurity: No Food Insecurity   Worried About Charity fundraiser in the Last Year: Never true   Arboriculturist in the Last Year: Never true  Transportation Needs: No Transportation Needs   Lack of Transportation (Medical): No   Lack of Transportation (Non-Medical): No  Physical Activity: Inactive   Days of Exercise per Week: 0 days   Minutes of Exercise per Session: 0 min  Stress: No Stress Concern Present   Feeling of Stress : Not at all  Social Connections: Moderately Integrated   Frequency of Communication with Friends and Family: More than three times a week   Frequency of Social Gatherings with Friends and Family: Once a week   Attends Religious Services: More than 4 times per year   Active Member of [employer] or Organizations: Yes   Attends Archivist Meetings: Never   Marital Status: Widowed    Tobacco Counseling Counseling given: Not Answered   Clinical Intake:  Pre-visit preparation completed: Yes  Pain : No/denies pain     Nutritional Status: BMI of 19-24  Normal Nutritional Risks: None Diabetes: Yes  How often do you need to have someone help you when you read instructions, pamphlets, or other written materials from your doctor or pharmacy?: 1 - Never  Diabetic? Yes Nutrition Risk Assessment:  Has the patient had any  N/V/D within the last 2 months?  No  Does the patient have any non-healing wounds?  No  Has the patient had any unintentional weight loss or weight gain?  No   Diabetes:  Is the patient diabetic?  Yes  If diabetic, was a CBG obtained today?  No  Did the patient bring in their glucometer from home?  No  How often do you monitor your CBG's? Three times daily.   Financial Strains and Diabetes Management:  Are you having any financial strains with the device, your supplies or your medication? No .  Does the patient want to be seen by Chronic Care Management for management of their diabetes?  No  Would the patient like to be referred to a Nutritionist or for Diabetic  Management?  No   Diabetic Exams:  Diabetic Eye Exam: Completed 02/13/2021 Diabetic Foot Exam: Completed 12/06/2020   Interpreter Needed?: No  Information entered by :: NAllen LPN   Activities of Daily Living In your present state of health, do you have any difficulty performing the following activities: 03/14/2021  Hearing? N  Vision? N  Difficulty concentrating or making decisions? N  Walking or climbing stairs? Y  Dressing or bathing? N  Doing errands, shopping? N  Preparing Food and eating ? N  Using the Toilet? N  In the past six months, have you accidently leaked urine? Y  Do you have problems with loss of bowel control? N  Managing your Medications? N  Managing your Finances? N  Housekeeping or managing your Housekeeping? N  Some recent data might be hidden    Patient Care Team: Glendale Chard, MD as PCP - General (Internal Medicine) Josue Hector, MD as PCP - Cardiology (Cardiology) Little, Claudette Stapler, RN as Case Manager  Indicate any recent Medical Services you may have received from other than [facility] providers in the past year (date may be approximate).     Assessment:   This is a routine wellness examination for [place].  Hearing/Vision screen Vision Screening - Comments:: Regular eye exams, [facility]  Dietary issues and exercise activities discussed: Current Exercise Habits: The patient does not participate in regular exercise at present   Goals Addressed             This Visit's Progress    Patient Stated       03/14/2021, wants to walk better       Depression Screen PHQ 2/9 Scores 03/14/2021 03/08/2020 11/09/2019 09/22/2018 09/16/2018 03/16/2018 12/10/2017  PHQ - 2 Score 0 0 0 0 0 0 0  PHQ- 9 Score - - - 0 0 - -    Fall Risk Fall Risk  03/14/2021 03/08/2020 11/09/2019 12/28/2018 09/16/2018  Falls in the past year? 0 0 0 0 0  Risk for fall due to : Impaired balance/gait;Impaired mobility;Medication side effect Impaired balance/gait;Impaired mobility;Medication side effect Medication side effect - -  Follow up Falls evaluation completed;Education provided;Falls prevention discussed Falls evaluation completed;Education provided;Falls prevention discussed Falls evaluation completed;Education provided;Falls prevention discussed - -    FALL RISK PREVENTION PERTAINING TO THE HOME:  Any stairs in or around the home? Yes  If so, are there any without handrails? No  Home free of loose throw rugs in walkways, pet beds, electrical cords, etc? Yes  Adequate lighting in your home to reduce risk of falls? Yes   ASSISTIVE DEVICES UTILIZED TO PREVENT FALLS:  Life alert? No  Use of a cane, walker or w/c? Yes  Grab bars in the bathroom? Yes  Shower chair or bench in shower? Yes  Elevated toilet seat or a handicapped toilet? Yes   TIMED UP AND GO:  Was the test performed? No .    Gait slow and steady with assistive device  Cognitive Function:     6CIT Screen 03/14/2021 03/08/2020 11/09/2019 09/16/2018  What Year? 0 points 0 points 0 points 0 points  What month? 0 points 0 points 0 points 0 points  What time? 3 points 0 points 0 points 0 points  Count back from 20 0 points 4 points 4 points 4 points  Months in reverse 0 points 4 points 4 points 0 points  Repeat phrase 8  points 6 points 0 points 0 points  Total Score 11 14 8  4    Immunizations Immunization History  Administered Date(s) Administered   Fluad Quad(high Dose 65+) 11/26/2018, 11/26/2018, 11/07/2019, 11/14/2020   Influenza, High Dose Seasonal PF 11/21/2017   Influenza-Unspecified 11/17/2012, 11/20/2017   PFIZER(Purple Top)SARS-COV-2 Vaccination 04/01/2019, 04/26/2019, 12/08/2019   Pneumococcal Conjugate-13 11/19/2014   Pneumococcal Polysaccharide-23 01/02/2016, 12/28/2018   Zoster Recombinat (Shingrix) 11/26/2018, 02/14/2019    TDAP status: Due, Education has been provided regarding the importance of this vaccine. Advised may receive this vaccine at local pharmacy or Health Dept. Aware to provide a copy of the vaccination record if obtained from local pharmacy or Health Dept. Verbalized acceptance and understanding.  Flu Vaccine status: Up to date  Pneumococcal vaccine status: Up to date  Covid-19 vaccine status: Completed vaccines  Qualifies for Shingles Vaccine? Yes   Zostavax completed No   Shingrix Completed?: Yes  Screening Tests Health Maintenance  Topic Date Due   TETANUS/TDAP  Never done   COVID-19 Vaccine (4 - Booster for Pfizer series) 02/02/2020   OPHTHALMOLOGY EXAM  01/24/2021   MAMMOGRAM  03/29/2021   HEMOGLOBIN A1C  05/14/2021   FOOT EXAM  12/06/2021   Pneumonia Vaccine 70+ Years old  Completed   INFLUENZA VACCINE  Completed   DEXA SCAN  Completed   Zoster Vaccines- Shingrix  Completed   HPV VACCINES  Aged Out    Health Maintenance  Health Maintenance Due  Topic Date Due   TETANUS/TDAP  Never done   COVID-19 Vaccine (4 - Booster for Pfizer series) 02/02/2020   OPHTHALMOLOGY EXAM  01/24/2021    Colorectal cancer screening: No longer required.   Mammogram status: Completed 03/29/2020. Repeat every year  Bone Density status: Completed 03/18/2018.   Lung Cancer Screening: (Low Dose CT Chest recommended if Age 41-80 years, 30 pack-year currently smoking OR  have quit w/in 15years.) does not qualify.   Lung Cancer Screening Referral: no  Additional Screening:  Hepatitis C Screening: does not qualify;   Vision Screening: Recommended annual ophthalmology exams for early detection of glaucoma and other disorders of the eye. Is the patient up to date with their annual eye exam?  Yes  Who is the provider or what is the name of the office in which the patient attends annual eye exams? [facility] If pt is not established with a provider, would they like to be referred to a provider to establish care? No .   Dental Screening: Recommended annual dental exams for proper oral hygiene  Community Resource Referral / Chronic Care Management: CRR required this visit?  No   CCM required this visit?  No      Plan:     I have personally reviewed and noted the following in the patients chart:   Medical and social history Use of alcohol, tobacco or illicit drugs  Current medications and supplements including opioid prescriptions.  Functional ability and status Nutritional status Physical activity Advanced directives List of other physicians Hospitalizations, surgeries, and ER visits in previous 12 months Vitals Screenings to include cognitive, depression, and falls Referrals and appointments  In addition, I have reviewed and discussed with patient certain preventive protocols, quality metrics, and best practice recommendations. A written personalized care plan for preventive services as well as general preventive health recommendations were provided to patient.     Kellie Simmering, LPN   [id number]   Nurse Notes: none

## 2021-03-14 NOTE — Patient Instructions (Signed)

## 2021-03-14 NOTE — Progress Notes (Signed)
Rich Brave [place],acting as a Education administrator for [name], MD.,have documented all relevant documentation on the behalf of [name], MD,as directed by  [name], MD while in the presence of [name], MD.  This visit occurred during the SARS-CoV-2 public health emergency.  Safety protocols were in place, including screening questions prior to the visit, additional usage of staff PPE, and extensive cleaning of exam room while observing appropriate contact time as indicated for disinfecting solutions.  Subjective:     Patient ID: [place] , female    DOB: 01/01/1928 , 88 y.o.   MRN: [id number]   Chief Complaint  Patient presents with   Diabetes   Hypertension    HPI  She presents today for DM/HTN check. She reports compliance with meds. She denies headaches, chest pain and shortness of breath. She is alone for visit, has walker.   She is also scheduled for AWV with [facility] Advisor today.   Diabetes She presents for her follow-up diabetic visit. She has type 2 diabetes mellitus. Her disease course has been stable. There are no hypoglycemic associated symptoms. Pertinent negatives for hypoglycemia include no dizziness or headaches. Pertinent negatives for diabetes include no blurred vision, no fatigue, no polydipsia, no polyphagia and no polyuria. There are no hypoglycemic complications. Risk factors for coronary artery disease include dyslipidemia, hypertension, post-menopausal and diabetes mellitus. Current diabetic treatment includes oral agent (monotherapy).  Hypertension This is a chronic problem. The current episode started more than 1 year ago. The problem has been gradually improving since onset. The problem is controlled. Pertinent negatives include no blurred vision or headaches.    Past Medical History:  Diagnosis Date   Cerebrovascular disease, unspecified    Coronary atherosclerosis of unspecified type of vessel, native or graft    Diabetes mellitus  without complication ([place])    Other and unspecified hyperlipidemia    Rheumatoid arthritis(714.0)    Shingles 2003   Unspecified essential hypertension      Family History  Problem Relation Age of Onset   Cancer Mother    Hypertension Father    Cancer Brother    Hypertension Paternal Grandmother    Heart disease Brother    Diabetes Neg Hx    Coronary artery disease Neg Hx      Current Outpatient Medications:    Accu-Chek Softclix Lancets lancets, Use as instructed to check blood sugars up to 3 times a day  Dx code e11.65, Disp: 100 each, Rfl: 12   acetaminophen (TYLENOL) 650 MG CR tablet, Take 650 mg by mouth every 8 (eight) hours as needed for pain., Disp: , Rfl:    amLODipine (NORVASC) 5 MG tablet, TAKE 1 TABLET(5 MG) BY MOUTH DAILY, Disp: 90 tablet, Rfl: 2   Blood Glucose Calibration (ACCU-CHEK AVIVA) SOLN, Use with accu-check aviva machine, Disp: 1 each, Rfl: 3   Blood Glucose Monitoring Suppl (ACCU-CHEK AVIVA PLUS) w/Device KIT, Use to check  Blood sugars up to 3 times a day.  Dx code e11.65, Disp: 1 kit, Rfl: 3   carvedilol (COREG) 12.5 MG tablet, TAKE 1 TABLET(12.5 MG) BY MOUTH TWICE DAILY WITH A MEAL, Disp: 180 tablet, Rfl: 1   chlorthalidone (HYGROTON) 25 MG tablet, Take 12.5 mg by mouth daily., Disp: , Rfl:    Cholecalciferol (VITAMIN D) 50 MCG (2000 UT) CAPS, Take by mouth., Disp: , Rfl:    clindamycin (CLEOCIN) 150 MG capsule, For dentist appointments, Disp: , Rfl:    dipyridamole-aspirin (AGGRENOX) 200-25 MG 12hr  capsule, TAKE 1 CAPSULE BY MOUTH DAILY, Disp: 180 capsule, Rfl: 1   glimepiride (AMARYL) 1 MG tablet, TAKE 1/2 TABLET BY MOUTH ON MONDAYS, WEDNESDAYS, AND FRIDAYS, Disp: 36 tablet, Rfl: 1   glucosamine-chondroitin 500-400 MG tablet, Take 1 tablet by mouth 3 (three) times daily., Disp: , Rfl:    glucose blood test strip, Use as instructed to check blood sugars up to 3 times a day.  Dx code e11.65, Disp: 100 each, Rfl: 12   irbesartan (AVAPRO) 150 MG tablet, Take  1 tablet (150 mg total) by mouth daily., Disp: 90 tablet, Rfl: 2   JARDIANCE 10 MG TABS tablet, Take 10 mg by mouth daily., Disp: , Rfl:    Multiple Vitamin (MULTIVITAMIN) capsule, Take 1 capsule by mouth daily., Disp: , Rfl:    nitroGLYCERIN (NITROSTAT) 0.4 MG SL tablet, PLACE 1 TABLET UNDER THE TONGUE AT THE FIRST SIGN OF ATTACK, MAY REPEAT EVERY 5 MINUTES FOR 3 DOSES IN 15 MINUTES. IF PAIN PERSISTS CALL 911, Disp: 50 tablet, Rfl: 0   rosuvastatin (CRESTOR) 5 MG tablet, TAKE 1 TABLET BY MOUTH MONDAY THROUGH FRIDAY. DO NOT TAKE ON SATURDAY AND SUNDAY, Disp: 90 tablet, Rfl: 1   No Known Allergies   Review of Systems  Constitutional: Negative.  Negative for fatigue.  Eyes:  Negative for blurred vision.  Respiratory: Negative.    Cardiovascular: Negative.   Gastrointestinal: Negative.   Endocrine: Negative for polydipsia, polyphagia and polyuria.  Neurological: Negative.  Negative for dizziness and headaches.  Psychiatric/Behavioral: Negative.      Today's Vitals   03/14/21 1431  BP: 128/60  Pulse: 67  Temp: 98.2 F (36.8 C)  Weight: 136 lb 0.4 oz (61.7 kg)  Height: '5\' 4"'  (1.626 m)   Body mass index is 23.35 kg/m.  Wt Readings from Last 3 Encounters:  03/14/21 136 lb 0.4 oz (61.7 kg)  03/14/21 136 lb (61.7 kg)  11/14/20 137 lb 9.6 oz (62.4 kg)     Objective:  Physical Exam Vitals and nursing note reviewed.  Constitutional:      Appearance: Normal appearance.  HENT:     Head: Normocephalic and atraumatic.     Nose:     Comments: Masked     Mouth/Throat:     Comments: Masked  Eyes:     Extraocular Movements: Extraocular movements intact.  Cardiovascular:     Rate and Rhythm: Normal rate and regular rhythm.     Heart sounds: Normal heart sounds.  Pulmonary:     Effort: Pulmonary effort is normal.     Breath sounds: Normal breath sounds.  Musculoskeletal:     Cervical back: Normal range of motion.  Skin:    General: Skin is warm.  Neurological:     General: No  focal deficit present.     Mental Status: She is alert.  Psychiatric:        Mood and Affect: Mood normal.        Behavior: Behavior normal.        Assessment And Plan:     1. Type 2 diabetes mellitus with stage 4 chronic kidney disease, without long-term current use of insulin (HCC) Comments: Chronic, she will f/u in 3-4 months for re-evaluation. Currently, on glimepiride 1/2 tab on MWF. She has not had any hypoglycemia.  - CMP14+EGFR - Hemoglobin A1c  2. Hypertensive heart and renal disease with renal failure, stage 1 through stage 4 or unspecified chronic kidney disease, without heart failure Comments: Chronic, well controlled. She is  also followed by Renal. Advised to stay well hydrated, avoid NSAIDs and keep BP controlled to decrease risk of CKD progression.  3. Peripheral arterial disease ([place]) Comments: She has no palpable pedal pulses, but pulses are present w/ noninvasive testing as per Vascular.  She will c/w compression hose and stay active as tolerated.   4. Rheumatoid arthritis, involving unspecified site, unspecified whether rheumatoid factor present ([place]) Comments: Chronic, encouraged to follow anti-inflammatory diet.    Patient was given opportunity to ask questions. Patient verbalized understanding of the plan and was able to repeat key elements of the plan. All questions were answered to their satisfaction.   I, [name], MD, have reviewed all documentation for this visit. The documentation on 03/14/21 for the exam, diagnosis, procedures, and orders are all accurate and complete.   IF YOU HAVE BEEN REFERRED TO A SPECIALIST, IT MAY TAKE 1-2 WEEKS TO SCHEDULE/PROCESS THE REFERRAL. IF YOU HAVE NOT HEARD FROM US/SPECIALIST IN TWO WEEKS, PLEASE GIVE [place] A CALL AT [phone].   THE PATIENT IS ENCOURAGED TO PRACTICE SOCIAL DISTANCING DUE TO THE COVID-19 PANDEMIC.

## 2021-03-14 NOTE — Patient Instructions (Signed)
[name] , Thank you for taking time to come for your Medicare Wellness Visit. I appreciate your ongoing commitment to your health goals. Please review the following plan we discussed and let me know if I can assist you in the future.   Screening recommendations/referrals: Colonoscopy: not required Mammogram: completed 03/29/2020 Bone Density: completed 03/18/2018 Recommended yearly ophthalmology/optometry visit for glaucoma screening and checkup Recommended yearly dental visit for hygiene and checkup  Vaccinations: Influenza vaccine: completed 11/14/2020 Pneumococcal vaccine: completed 12/28/2018 Tdap vaccine: due Shingles vaccine: completed   Covid-19: 12/08/2019, 04/26/2019, 04/01/2019  Advanced directives: Please bring a copy of your POA (Power of Attorney) and/or Living Will to your next appointment.   Conditions/risks identified: none  Next appointment: Follow up in one year for your annual wellness visit    Preventive Care 65 Years and Older, Female Preventive care refers to lifestyle choices and visits with your health care provider that can promote health and wellness. What does preventive care include? A yearly physical exam. This is also called an annual well check. Dental exams once or twice a year. Routine eye exams. Ask your health care provider how often you should have your eyes checked. Personal lifestyle choices, including: Daily care of your teeth and gums. Regular physical activity. Eating a healthy diet. Avoiding tobacco and drug use. Limiting alcohol use. Practicing safe sex. Taking low-dose aspirin every day. Taking vitamin and mineral supplements as recommended by your health care provider. What happens during an annual well check? The services and screenings done by your health care provider during your annual well check will depend on your age, overall health, lifestyle risk factors, and family history of disease. Counseling  Your health care  provider may ask you questions about your: Alcohol use. Tobacco use. Drug use. Emotional well-being. Home and relationship well-being. Sexual activity. Eating habits. History of falls. Memory and ability to understand (cognition). Work and work Statistician. Reproductive health. Screening  You may have the following tests or measurements: Height, weight, and BMI. Blood pressure. Lipid and cholesterol levels. These may be checked every 5 years, or more frequently if you are over 64 years old. Skin check. Lung cancer screening. You may have this screening every year starting at age 1 if you have a 30-pack-year history of smoking and currently smoke or have quit within the past 15 years. Fecal occult blood test (FOBT) of the stool. You may have this test every year starting at age 68. Flexible sigmoidoscopy or colonoscopy. You may have a sigmoidoscopy every 5 years or a colonoscopy every 10 years starting at age 87. Hepatitis C blood test. Hepatitis B blood test. Sexually transmitted disease (STD) testing. Diabetes screening. This is done by checking your blood sugar (glucose) after you have not eaten for a while (fasting). You may have this done every 1-3 years. Bone density scan. This is done to screen for osteoporosis. You may have this done starting at age 66. Mammogram. This may be done every 1-2 years. Talk to your health care provider about how often you should have regular mammograms. Talk with your health care provider about your test results, treatment options, and if necessary, the need for more tests. Vaccines  Your health care provider may recommend certain vaccines, such as: Influenza vaccine. This is recommended every year. Tetanus, diphtheria, and acellular pertussis (Tdap, Td) vaccine. You may need a Td booster every 10 years. Zoster vaccine. You may need this after age 73. Pneumococcal 13-valent conjugate (PCV13) vaccine. One dose is recommended after  age  15. Pneumococcal polysaccharide (PPSV23) vaccine. One dose is recommended after age 7. Talk to your health care provider about which screenings and vaccines you need and how often you need them. This information is not intended to replace advice given to you by your health care provider. Make sure you discuss any questions you have with your health care provider. Document Released: 03/02/2015 Document Revised: 10/24/2015 Document Reviewed: 12/05/2014 Elsevier Interactive Patient Education  2017 [place] Prevention in the Home Falls can cause injuries. They can happen to people of all ages. There are many things you can do to make your home safe and to help prevent falls. What can I do on the outside of my home? Regularly fix the edges of walkways and driveways and fix any cracks. Remove anything that might make you trip as you walk through a door, such as a raised step or threshold. Trim any bushes or trees on the path to your home. Use bright outdoor lighting. Clear any walking paths of anything that might make someone trip, such as rocks or tools. Regularly check to see if handrails are loose or broken. Make sure that both sides of any steps have handrails. Any raised decks and porches should have guardrails on the edges. Have any leaves, snow, or ice cleared regularly. Use sand or salt on walking paths during winter. Clean up any spills in your garage right away. This includes oil or grease spills. What can I do in the bathroom? Use night lights. Install grab bars by the toilet and in the tub and shower. Do not use towel bars as grab bars. Use non-skid mats or decals in the tub or shower. If you need to sit down in the shower, use a plastic, non-slip stool. Keep the floor dry. Clean up any water that spills on the floor as soon as it happens. Remove soap buildup in the tub or shower regularly. Attach bath mats securely with double-sided non-slip rug tape. Do not have throw  rugs and other things on the floor that can make you trip. What can I do in the bedroom? Use night lights. Make sure that you have a light by your bed that is easy to reach. Do not use any sheets or blankets that are too big for your bed. They should not hang down onto the floor. Have a firm chair that has side arms. You can use this for support while you get dressed. Do not have throw rugs and other things on the floor that can make you trip. What can I do in the kitchen? Clean up any spills right away. Avoid walking on wet floors. Keep items that you use a lot in easy-to-reach places. If you need to reach something above you, use a strong step stool that has a grab bar. Keep electrical cords out of the way. Do not use floor polish or wax that makes floors slippery. If you must use wax, use non-skid floor wax. Do not have throw rugs and other things on the floor that can make you trip. What can I do with my stairs? Do not leave any items on the stairs. Make sure that there are handrails on both sides of the stairs and use them. Fix handrails that are broken or loose. Make sure that handrails are as long as the stairways. Check any carpeting to make sure that it is firmly attached to the stairs. Fix any carpet that is loose or worn. Avoid having throw rugs  at the top or bottom of the stairs. If you do have throw rugs, attach them to the floor with carpet tape. Make sure that you have a light switch at the top of the stairs and the bottom of the stairs. If you do not have them, ask someone to add them for you. What else can I do to help prevent falls? Wear shoes that: Do not have high heels. Have rubber bottoms. Are comfortable and fit you well. Are closed at the toe. Do not wear sandals. If you use a stepladder: Make sure that it is fully opened. Do not climb a closed stepladder. Make sure that both sides of the stepladder are locked into place. Ask someone to hold it for you, if  possible. Clearly mark and make sure that you can see: Any grab bars or handrails. First and last steps. Where the edge of each step is. Use tools that help you move around (mobility aids) if they are needed. These include: Canes. Walkers. Scooters. Crutches. Turn on the lights when you go into a dark area. Replace any light bulbs as soon as they burn out. Set up your furniture so you have a clear path. Avoid moving your furniture around. If any of your floors are uneven, fix them. If there are any pets around you, be aware of where they are. Review your medicines with your doctor. Some medicines can make you feel dizzy. This can increase your chance of falling. Ask your doctor what other things that you can do to help prevent falls. This information is not intended to replace advice given to you by your health care provider. Make sure you discuss any questions you have with your health care provider. Document Released: 11/30/2008 Document Revised: 07/12/2015 Document Reviewed: 03/10/2014 Elsevier Interactive Patient Education  2017 [employer].

## 2021-03-15 LAB — CMP14+EGFR
ALT: 12 IU/L (ref 0–32)
AST: 19 IU/L (ref 0–40)
Albumin/Globulin Ratio: 1.7 (ref 1.2–2.2)
Albumin: 4.4 g/dL (ref 3.5–4.6)
Alkaline Phosphatase: 105 IU/L (ref 44–121)
BUN/Creatinine Ratio: 24 (ref 12–28)
BUN: 48 mg/dL — ABNORMAL HIGH (ref 10–36)
Bilirubin Total: <0.2 mg/dL (ref 0.0–1.2)
CO2: 24 mmol/L (ref 20–29)
Calcium: 9.7 mg/dL (ref 8.7–10.3)
Chloride: 102 mmol/L (ref 96–106)
Creatinine, Ser: 2.02 mg/dL — ABNORMAL HIGH (ref 0.57–1.00)
Globulin, Total: 2.6 g/dL (ref 1.5–4.5)
Glucose: 153 mg/dL — ABNORMAL HIGH (ref 70–99)
Potassium: 4.6 mmol/L (ref 3.5–5.2)
Sodium: 141 mmol/L (ref 134–144)
Total Protein: 7 g/dL (ref 6.0–8.5)
eGFR: 23 mL/min/{1.73_m2} — ABNORMAL LOW (ref >59)

## 2021-03-15 LAB — HEMOGLOBIN A1C
Est. average glucose Bld gHb Est-mCnc: 148 mg/dL
Hgb A1c MFr Bld: 6.8 % — ABNORMAL HIGH (ref 4.8–5.6)

## 2021-03-17 DIAGNOSIS — I739 Peripheral vascular disease, unspecified: Secondary | ICD-10-CM | POA: Insufficient documentation

## 2021-03-18 ENCOUNTER — Telehealth: Payer: Medicare HMO

## 2021-03-18 ENCOUNTER — Telehealth: Payer: Self-pay

## 2021-03-18 NOTE — Telephone Encounter (Signed)
°  Care Management   Follow Up Note   03/18/2021 Name: [name] MRN: [id number] DOB: 1927-03-25   Referred by: Glendale Chard, MD Reason for referral : Chronic Care Management (RN CM Follow up call )   An unsuccessful telephone outreach was attempted today. The patient was referred to the case management team for assistance with care management and care coordination.   Follow Up Plan: A HIPPA compliant phone message was left for the patient providing contact information and requesting a return call.   Barb Merino, RN, BSN, CCM Care Management Coordinator [place] [facility]  Direct Phone: [phone]

## 2021-03-19 DIAGNOSIS — I1310 Hypertensive heart and chronic kidney disease without heart failure, with stage 1 through stage 4 chronic kidney disease, or unspecified chronic kidney disease: Secondary | ICD-10-CM

## 2021-03-19 DIAGNOSIS — N184 Chronic kidney disease, stage 4 (severe): Secondary | ICD-10-CM

## 2021-03-19 DIAGNOSIS — E1122 Type 2 diabetes mellitus with diabetic chronic kidney disease: Secondary | ICD-10-CM

## 2021-03-26 ENCOUNTER — Encounter: Payer: Self-pay | Admitting: Internal Medicine

## 2021-04-02 ENCOUNTER — Telehealth: Payer: Medicare PPO

## 2021-04-02 ENCOUNTER — Ambulatory Visit (INDEPENDENT_AMBULATORY_CARE_PROVIDER_SITE_OTHER): Payer: Medicare Other

## 2021-04-02 DIAGNOSIS — M069 Rheumatoid arthritis, unspecified: Secondary | ICD-10-CM

## 2021-04-02 DIAGNOSIS — I2510 Atherosclerotic heart disease of native coronary artery without angina pectoris: Secondary | ICD-10-CM

## 2021-04-02 DIAGNOSIS — I1310 Hypertensive heart and chronic kidney disease without heart failure, with stage 1 through stage 4 chronic kidney disease, or unspecified chronic kidney disease: Secondary | ICD-10-CM

## 2021-04-02 DIAGNOSIS — I739 Peripheral vascular disease, unspecified: Secondary | ICD-10-CM

## 2021-04-02 DIAGNOSIS — E1122 Type 2 diabetes mellitus with diabetic chronic kidney disease: Secondary | ICD-10-CM

## 2021-04-02 NOTE — Chronic Care Management (AMB) (Signed)
Chronic Care Management   CCM RN Visit Note  04/02/2021 Name: [name] MRN: [id number] DOB: 04/08/1927  Subjective: [name] is a 88 y.o. year old female who is a primary care patient of Glendale Chard, MD. The care management team was consulted for assistance with disease management and care coordination needs.    Engaged with patient by telephone for follow up visit in response to provider referral for case management and/or care coordination services.   Consent to Services:  The patient was given information about Chronic Care Management services, agreed to services, and gave verbal consent prior to initiation of services.  Please see initial visit note for detailed documentation.   Patient agreed to services and verbal consent obtained.   Assessment: Review of patient past medical history, allergies, medications, health status, including review of consultants reports, laboratory and other test data, was performed as part of comprehensive evaluation and provision of chronic care management services.   SDOH (Social Determinants of Health) assessments and interventions performed:  Yes, no acute needs  CCM Care Plan  No Known Allergies  Outpatient Encounter Medications as of 04/02/2021  Medication Sig   acetaminophen (TYLENOL) 650 MG CR tablet Take 650 mg by mouth every 8 (eight) hours as needed for pain.   amLODipine (NORVASC) 5 MG tablet TAKE 1 TABLET(5 MG) BY MOUTH DAILY   carvedilol (COREG) 12.5 MG tablet TAKE 1 TABLET(12.5 MG) BY MOUTH TWICE DAILY WITH A MEAL   chlorthalidone (HYGROTON) 25 MG tablet Take 12.5 mg by mouth daily.   Cholecalciferol (VITAMIN D) 50 MCG (2000 UT) CAPS Take by mouth.   dipyridamole-aspirin (AGGRENOX) 200-25 MG 12hr capsule TAKE 1 CAPSULE BY MOUTH DAILY   glimepiride (AMARYL) 1 MG tablet TAKE 1/2 TABLET BY MOUTH ON MONDAYS, WEDNESDAYS, AND FRIDAYS   glucosamine-chondroitin 500-400 MG tablet Take 1 tablet by mouth 3 (three) times daily.    irbesartan (AVAPRO) 150 MG tablet Take 1 tablet (150 mg total) by mouth daily.   JARDIANCE 10 MG TABS tablet Take 10 mg by mouth daily.   Multiple Vitamin (MULTIVITAMIN) capsule Take 1 capsule by mouth daily.   nitroGLYCERIN (NITROSTAT) 0.4 MG SL tablet PLACE 1 TABLET UNDER THE TONGUE AT THE FIRST SIGN OF ATTACK, MAY REPEAT EVERY 5 MINUTES FOR 3 DOSES IN 15 MINUTES. IF PAIN PERSISTS CALL 911   rosuvastatin (CRESTOR) 5 MG tablet TAKE 1 TABLET BY MOUTH MONDAY THROUGH FRIDAY. DO NOT TAKE ON SATURDAY AND SUNDAY   Accu-Chek Softclix Lancets lancets Use as instructed to check blood sugars up to 3 times a day  Dx code e11.65 (Patient not taking: Reported on 04/02/2021)   Blood Glucose Calibration (ACCU-CHEK AVIVA) SOLN Use with accu-check aviva machine (Patient not taking: Reported on 04/02/2021)   Blood Glucose Monitoring Suppl (ACCU-CHEK AVIVA PLUS) w/Device KIT Use to check  Blood sugars up to 3 times a day.  Dx code e11.65 (Patient not taking: Reported on 04/02/2021)   clindamycin (CLEOCIN) 150 MG capsule For dentist appointments (Patient not taking: Reported on 04/02/2021)   glucose blood test strip Use as instructed to check blood sugars up to 3 times a day.  Dx code e11.65 (Patient not taking: Reported on 04/02/2021)   No facility-administered encounter medications on file as of 04/02/2021.    Patient Active Problem List   Diagnosis Date Noted   Peripheral arterial disease ([place]) 03/17/2021   Onychomycosis 02/28/2021   Right leg pain 07/26/2020   Onychodystrophy 07/26/2020   CKD (chronic kidney disease) stage 4, GFR  15-29 ml/min ([place]) 07/19/2020   Hypertensive heart and renal disease 03/22/2020   Gout 12/07/2017   Type 2 diabetes mellitus with stage 3 chronic kidney disease, without long-term current use of insulin ([place]) 02/11/2014   History of coronary artery bypass graft 07/01/2010   MYOCARDIAL INFARCTION, INFERIOR WALL, SUBSEQUENT CARE 05/12/2008   CAD, NATIVE VESSEL 05/12/2008    HYPERLIPIDEMIA-MIXED 05/11/2008   Essential hypertension 05/11/2008   CAD, UNSPECIFIED SITE 05/11/2008   CEREBROVASCULAR DISEASE 05/11/2008   Rheumatoid arthritis ([place]) 05/11/2008    Conditions to be addressed/monitored: DM related to type 2 with stage 3 CKD, Rheumatoid Arthritis, HTN, PAD, CAD   Care Plan : RN Care Manager Plan of Care  Updates made by Lynne Logan, RN since 04/02/2021 12:00 AM     Problem: No plan of care established for management of chronic disease states (DM related to type 2 with stage 3 CKD, Rheumatoid Arthritis, HTN, PAD, CAD)   Priority: High     Long-Range Goal: Development of plan of care for chronic disease management for DM related to type 2 with stage 3 CKD, Rheumatoid Arthritis, HTN, PAD, CAD   Start Date: 01/21/2021  Expected End Date: 01/21/2022  Recent Progress: On track  Priority: High  Note:   Current Barriers:  Knowledge Deficits related to plan of care for management of DM related to type 2 with stage 3 CKD, Rheumatoid Arthritis, HTN, PAD, CAD   Chronic Disease Management support and education needs related to DM related to type 2 with stage 3 CKD, Rheumatoid Arthritis, HTN, PAD, CAD    RNCM Clinical Goal(s):  Patient will verbalize basic understanding of  HTN and DMII disease process and self health management plan as evidenced by DM related to type 2 with stage 3 CKD, Rheumatoid Arthritis, HTN, PAD, CAD  take all medications exactly as prescribed and will call provider for medication related questions as evidenced by patient will report having no missed doses of her prescribed medications  demonstrate Ongoing health management independence as evidenced by will continue to maintain the ability to perform Self Care and Self Health management of her chronic conditions continue to work with RN Care Manager to address care management and care coordination needs related to  DM related to type 2 with stage 3 CKD, Rheumatoid Arthritis, HTN, PAD, CAD  as  evidenced by adherence to CM Team Scheduled appointments demonstrate ongoing self health care management ability   as evidenced by    through collaboration with RN Care manager, provider, and care team.   Interventions: 1:1 collaboration with primary care provider regarding development and update of comprehensive plan of care as evidenced by provider attestation and co-signature Inter-disciplinary care team collaboration (see longitudinal plan of care) Evaluation of current treatment plan related to  self management and patient's adherence to plan as established by provider   Chronic Kidney Disease Interventions:  (Status:  Goal on track:  Yes.) Long Term Goal Assessed the Patient understanding of chronic kidney disease    Evaluation of current treatment plan related to chronic kidney disease self management and patient's adherence to plan as established by provider      Reviewed prescribed diet increase water to 48 oz daily Assessed social determinant of health barriers    Provided education on kidney disease progression    Directed patient to discuss with Nephrologist at next scheduled visit, dietary and fluid recommendations Educated patient while providing rationale re: signs/symptoms that may occur with stage IV chronic kidney disease and encouraged  patient to notify her provider if symptoms occur Discussed plans with patient for ongoing care management follow up and provided patient with direct contact information for care management team Last practice recorded BP readings:  BP Readings from Last 3 Encounters:  03/14/21 128/60  03/14/21 128/60  11/14/20 130/62  Most recent eGFR/CrCl:  Lab Results  Component Value Date   EGFR 23 (L) 03/14/2021    No components found for: CRCL   Diabetes Interventions:  (Status:  Goal on track:  Yes.) Long Term Goal Assessed patient's understanding of A1c goal: <6.5% Provided education to patient about basic DM disease process Reviewed medications  with patient and discussed importance of medication adherence Counseled on importance of regular laboratory monitoring as prescribed Advised patient, providing education and rationale, to check cbg daily before breakfast and at bedtime and record, calling PCP for findings outside established parameters Review of patient status, including review of consultants reports, relevant laboratory and other test results, and medications completed Educated patient on dietary and exercise recommendations; daily glycemic control FBS 80-130, <180 after meals;15'15' rule Mailed printed educational materials related to Carb Choices Discussed plans with patient for ongoing care management follow up and provided patient with direct contact information for care management team Lab Results  Component Value Date   HGBA1C 6.8 (H) 03/14/2021    Pain Interventions:  (Status:  Goal on track:  Yes.) Long Term Goal Reviewed provider established plan for pain management Pain assessment performed Determined patient continues to have persistent pain to her knees and lower back secondary to arthritis Medications reviewed, determined patient is taking Acetaminophen as directed with good effectiveness as evidence by pain is minimized  Assessed for falls since last RN encounter, patient denies Assessed for DME needs, patient is using her walker at all times Educated on fall prevention and provided tips to help avoid falls  Counseled on the importance of reporting any/all new or changed pain symptoms or management strategies to pain management provider Sent in basket message to embedded BSW requesting assistance with a home safety alert system  Advised patient to report to care team affect of pain on daily activities Determined patient continues to have difficulty performing light house keeping Encouraged patient to pace herself and balance her activity with rest  Discussed plans with patient for ongoing care management follow  up and provided patient with direct contact information for care management team   Hypertension Interventions:  (Status:  Goal on track:  Yes.) Long Term Goal Last practice recorded BP readings:  BP Readings from Last 3 Encounters:  03/14/21 128/60  03/14/21 128/60  11/14/20 130/62  Most recent eGFR/CrCl:  Lab Results  Component Value Date   EGFR 23 (L) 03/14/2021    No components found for: CRCL Evaluation of current treatment plan related to hypertension self management and patient's adherence to plan as established by provider Provided education to patient re: stroke prevention, s/s of heart attack and stroke Advised patient, providing education and rationale, to monitor blood pressure daily and record, calling PCP for findings outside established parameters Provided education on prescribed diet low Sodium Discussed plans with patient for ongoing care management follow up and provided patient with direct contact information for care management team  Patient Goals/Self-Care Activities: Take all medications as prescribed Attend all scheduled provider appointments Call pharmacy for medication refills 3-7 days in advance of running out of medications Attend church or other social activities Perform all self care activities independently  Perform IADL's (shopping, preparing meals, housekeeping, managing  finances) independently Call provider office for new concerns or questions  Work with the social worker to address care coordination needs and will continue to work with the clinical team to address health care and disease management related needs Increase water to 48 oz daily, check with your kidney doctor about dietary and fluid recommendations manage portion size take medications for blood pressure exactly as prescribed  Follow Up Plan:  Telephone follow up appointment with care management team member scheduled for:  06/03/21      Barb Merino, RN, BSN, CCM Care Management  Coordinator [place] [facility]  Direct Phone: [phone]

## 2021-04-02 NOTE — Patient Instructions (Signed)
Visit Information  Thank you for taking time to visit with me today. Please don't hesitate to contact me if I can be of assistance to you before our next scheduled telephone appointment.  Following are the goals we discussed today:  (Copy and paste patient goals from clinical care plan here)  Our next appointment is by telephone on 06/03/21 at 11:15 AM  Please call the care guide team at [phone] if you need to cancel or reschedule your appointment.   If you are experiencing a Mental Health or [place] or need someone to talk to, please call [phone]-TALK (toll free, 24 hour hotline)   The patient verbalized understanding of instructions, educational materials, and care plan provided today and agreed to receive a mailed copy of patient instructions, educational materials, and care plan.   Barb Merino, RN, BSN, CCM Care Management Coordinator [place] [facility]  Direct Phone: [phone]

## 2021-04-04 LAB — HM MAMMOGRAPHY

## 2021-04-05 ENCOUNTER — Encounter (HOSPITAL_COMMUNITY): Payer: Self-pay | Admitting: Emergency Medicine

## 2021-04-05 ENCOUNTER — Emergency Department (HOSPITAL_COMMUNITY): Payer: Medicare Other

## 2021-04-05 ENCOUNTER — Emergency Department (HOSPITAL_COMMUNITY)
Admission: EM | Admit: 2021-04-05 | Discharge: 2021-04-05 | Disposition: A | Discharge status: Home / Self Care | Payer: Medicare Other | Attending: Emergency Medicine | Admitting: Emergency Medicine

## 2021-04-05 DIAGNOSIS — M25551 Pain in right hip: Secondary | ICD-10-CM | POA: Insufficient documentation

## 2021-04-05 DIAGNOSIS — Z7984 Long term (current) use of oral hypoglycemic drugs: Secondary | ICD-10-CM | POA: Diagnosis not present

## 2021-04-05 DIAGNOSIS — M25561 Pain in right knee: Secondary | ICD-10-CM | POA: Diagnosis not present

## 2021-04-05 DIAGNOSIS — Z20822 Contact with and (suspected) exposure to covid-19: Secondary | ICD-10-CM | POA: Diagnosis not present

## 2021-04-05 LAB — RESP PANEL BY RT-PCR (FLU A&B, COVID) ARPGX2
Influenza A by PCR: NEGATIVE
Influenza B by PCR: NEGATIVE
SARS Coronavirus 2 by RT PCR: NEGATIVE

## 2021-04-05 LAB — CBC WITH DIFFERENTIAL/PLATELET
Abs Immature Granulocytes: 0.01 10*3/uL (ref 0.00–0.07)
Basophils Absolute: 0 10*3/uL (ref 0.0–0.1)
Basophils Relative: 0 %
Eosinophils Absolute: 0.1 10*3/uL (ref 0.0–0.5)
Eosinophils Relative: 1 %
HCT: 34.6 % — ABNORMAL LOW (ref 36.0–46.0)
Hemoglobin: 11.1 g/dL — ABNORMAL LOW (ref 12.0–15.0)
Immature Granulocytes: 0 %
Lymphocytes Relative: 13 %
Lymphs Abs: 0.7 10*3/uL (ref 0.7–4.0)
MCH: 32.4 pg (ref 26.0–34.0)
MCHC: 32.1 g/dL (ref 30.0–36.0)
MCV: 100.9 fL — ABNORMAL HIGH (ref 80.0–100.0)
Monocytes Absolute: 0.5 10*3/uL (ref 0.1–1.0)
Monocytes Relative: 9 %
Neutro Abs: 4.3 10*3/uL (ref 1.7–7.7)
Neutrophils Relative %: 77 %
Platelets: 195 10*3/uL (ref 150–400)
RBC: 3.43 MIL/uL — ABNORMAL LOW (ref 3.87–5.11)
RDW: 12.3 % (ref 11.5–15.5)
WBC: 5.6 10*3/uL (ref 4.0–10.5)
nRBC: 0 % (ref 0.0–0.2)

## 2021-04-05 LAB — COMPREHENSIVE METABOLIC PANEL
ALT: 14 U/L (ref 0–44)
AST: 20 U/L (ref 15–41)
Albumin: 3.8 g/dL (ref 3.5–5.0)
Alkaline Phosphatase: 77 U/L (ref 38–126)
Anion gap: 10 (ref 5–15)
BUN: 56 mg/dL — ABNORMAL HIGH (ref 8–23)
CO2: 24 mmol/L (ref 22–32)
Calcium: 9.3 mg/dL (ref 8.9–10.3)
Chloride: 105 mmol/L (ref 98–111)
Creatinine, Ser: 2.03 mg/dL — ABNORMAL HIGH (ref 0.44–1.00)
GFR, Estimated: 23 mL/min — ABNORMAL LOW (ref >60)
Glucose, Bld: 167 mg/dL — ABNORMAL HIGH (ref 70–99)
Potassium: 4.5 mmol/L (ref 3.5–5.1)
Sodium: 139 mmol/L (ref 135–145)
Total Bilirubin: 0.3 mg/dL (ref 0.3–1.2)
Total Protein: 7.1 g/dL (ref 6.5–8.1)

## 2021-04-05 IMAGING — DX DG KNEE COMPLETE 4+V*R*
4 series · 4 of 4 positions shown · non-contrast
Comparison: [DATE]

CLINICAL DATA: Arthritis.

EXAM:
RIGHT KNEE - COMPLETE 4+ VIEW

[knee ap]
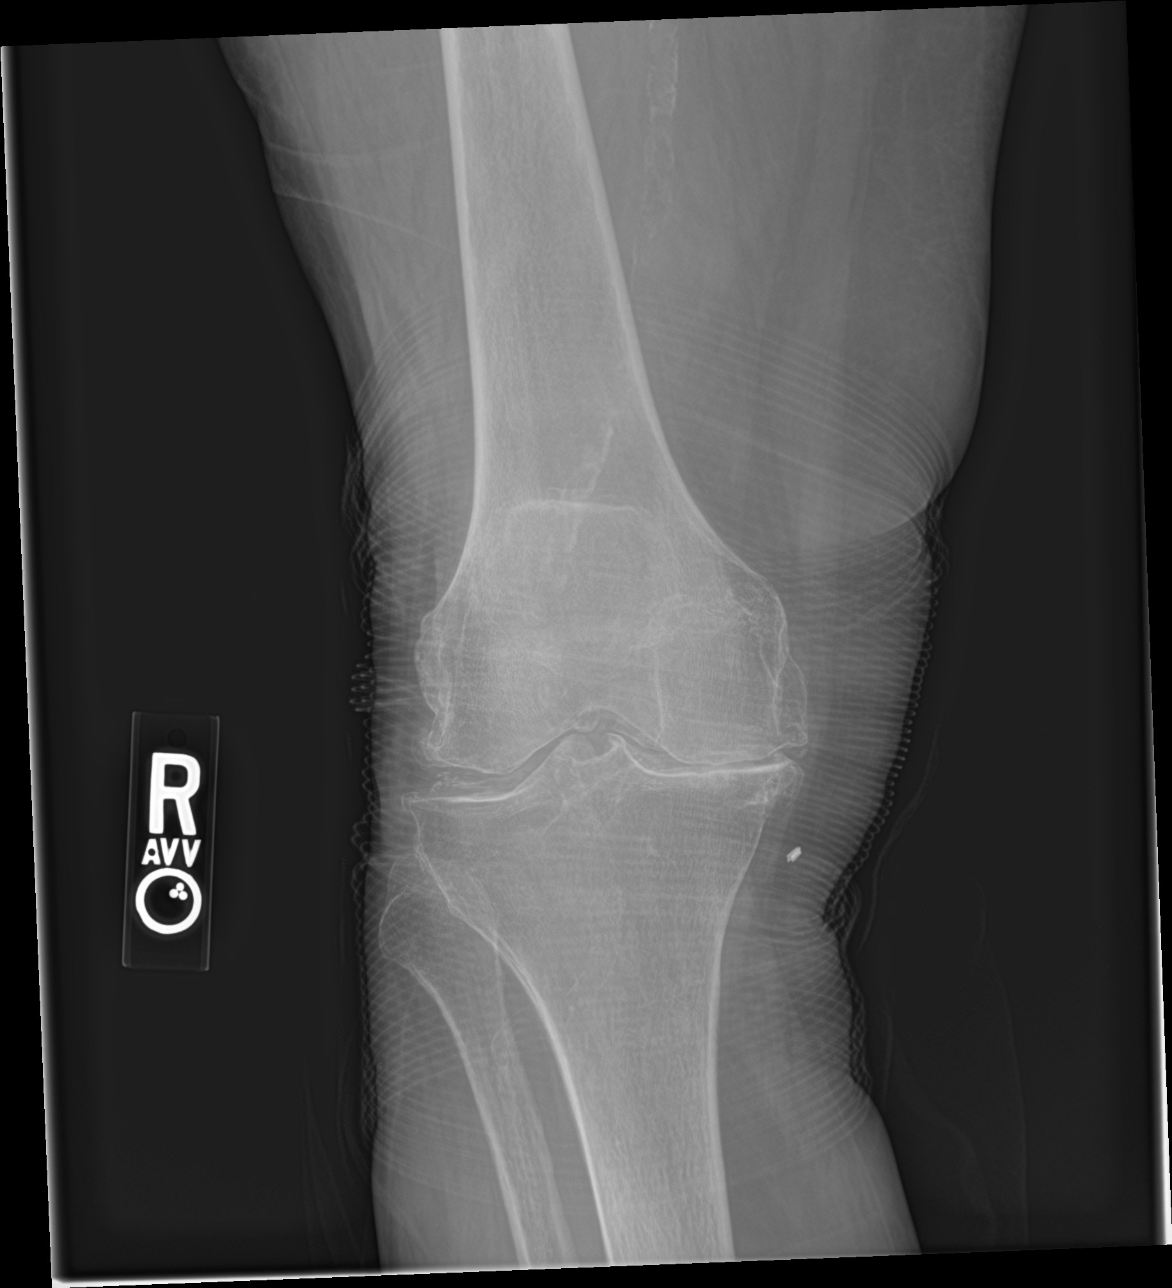

[knee lat]
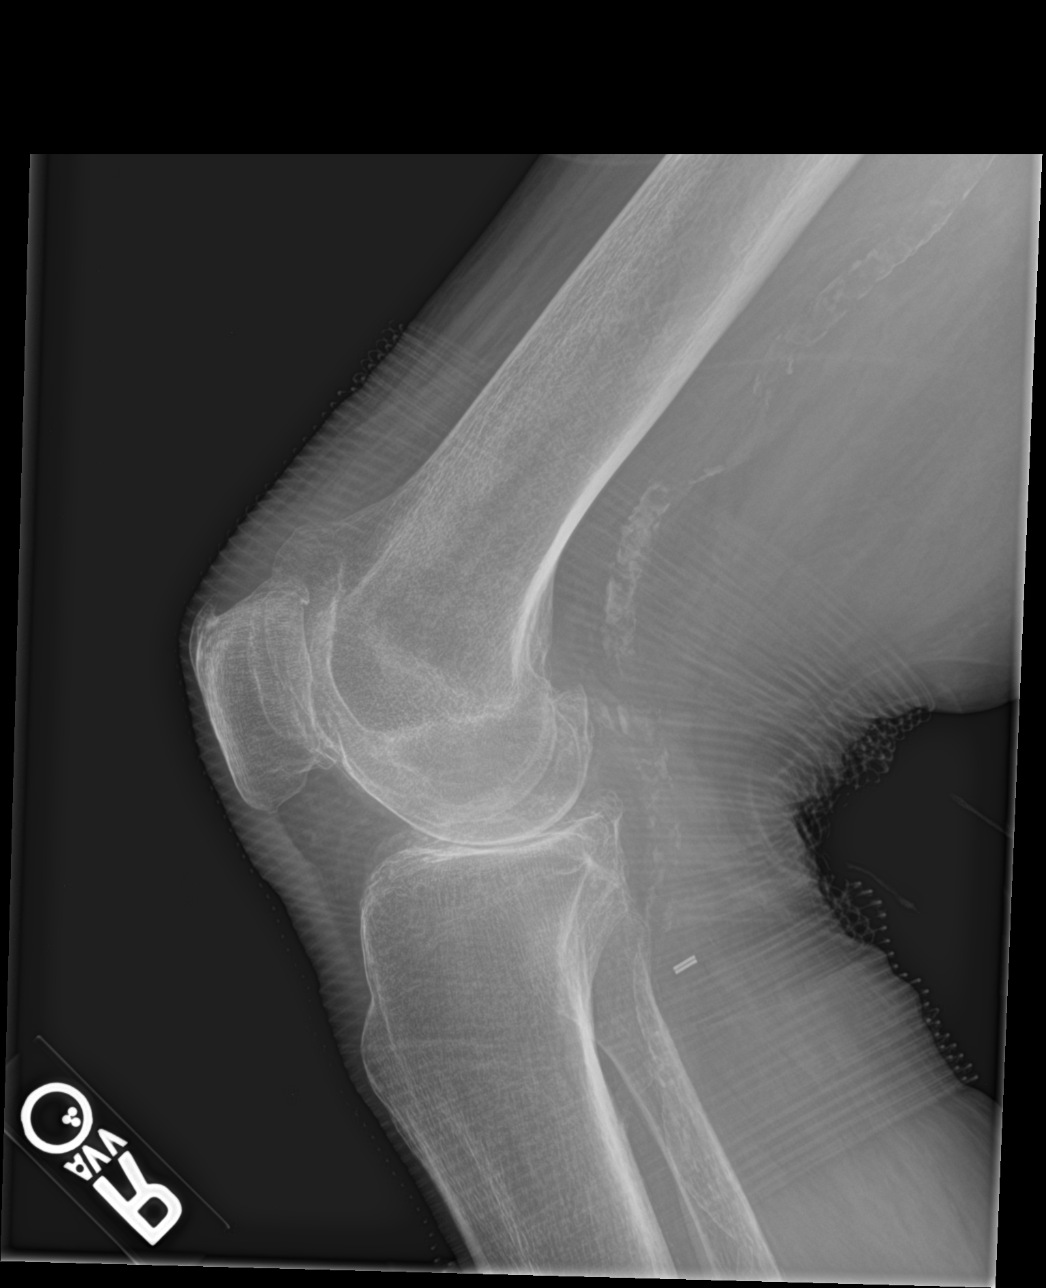

[knee obl (1 of 2)]
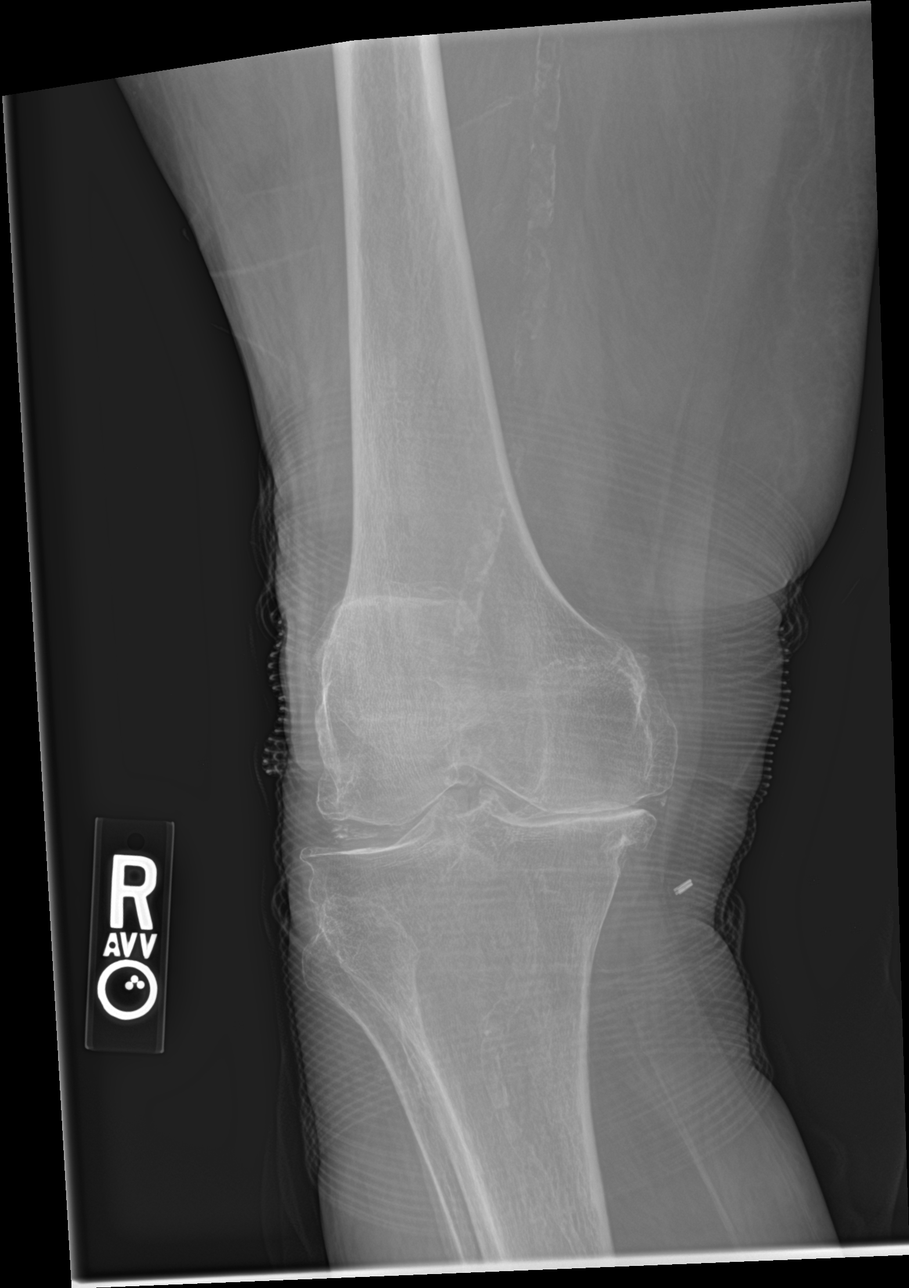

[knee obl (2 of 2)]
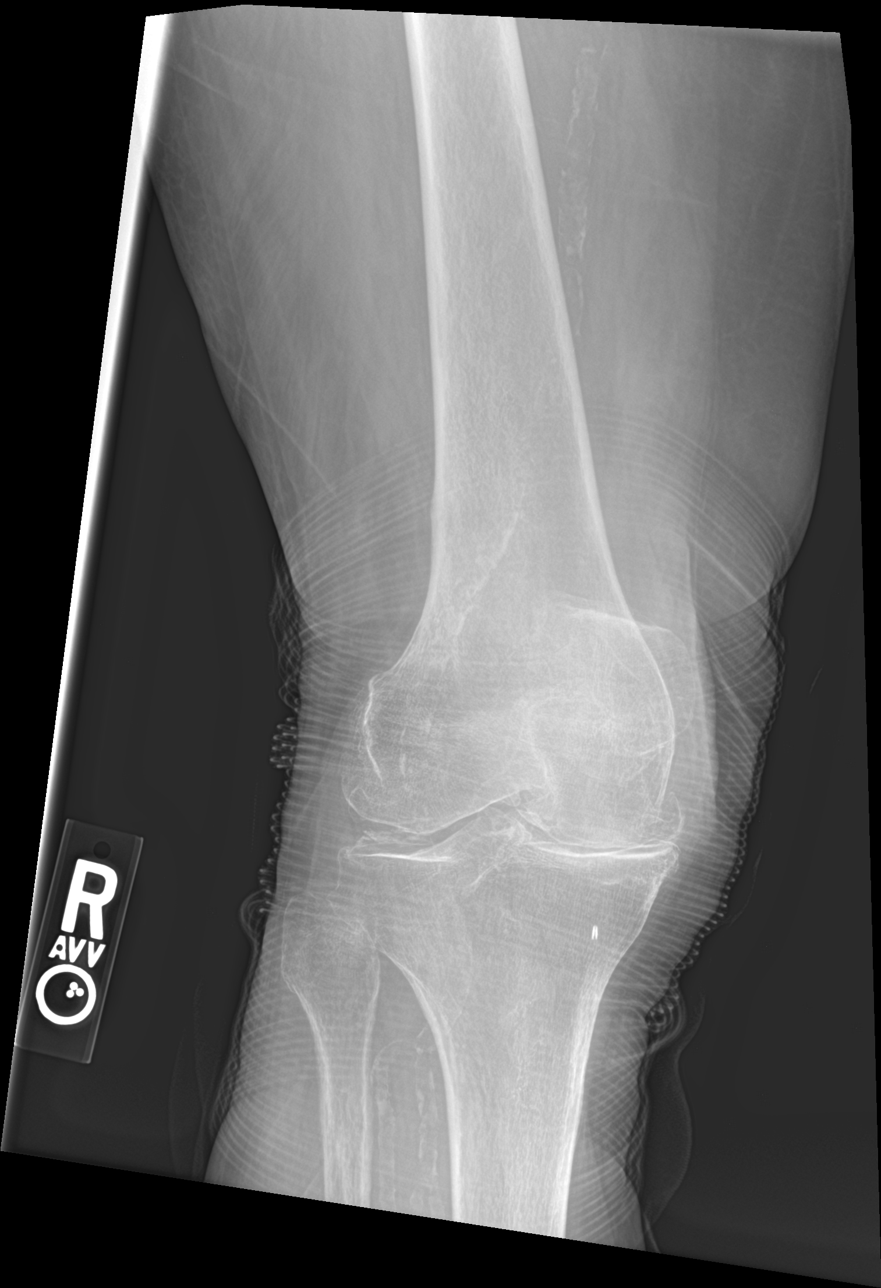

[4 of 4 positions shown; findings below may reference images not displayed]

FINDINGS: Severe medial compartment joint space narrowing, subchondral
sclerosis, and peripheral osteophytosis. Lateral compartment
chondrocalcinosis with moderate peripheral degenerative
osteophytosis, similar to prior. Severe patellofemoral joint space
narrowing and peripheral osteophytosis. Within the limitations of
diffuse decreased bone mineralization no acute fracture is seen.

No joint effusion. Mild chronic spurring at the quadriceps insertion
on the patella. Dense vascular calcifications. Surgical clips again
overlie the posteromedial proximal calf.
IMPRESSION: Severe medial and patellofemoral compartment and moderate lateral
compartment osteoarthritis.

## 2021-04-05 MED ORDER — CYCLOBENZAPRINE HCL 5 MG PO TABS: 10.0000 mg | ORAL_TABLET | Freq: Two times a day (BID) | ORAL | 0 refills | Status: DC | PRN

## 2021-04-05 MED ORDER — ACETAMINOPHEN 325 MG PO TABS
650.0000 mg | ORAL_TABLET | Freq: Once | ORAL | Status: AC
  Administered 2021-04-05: 650 mg via ORAL
  Filled 2021-04-05: qty 2

## 2021-04-05 NOTE — ED Provider Notes (Addendum)
[facility] EMERGENCY DEPARTMENT Provider Note   CSN: [id number] Arrival date & time: 04/05/21  1007     History  Chief Complaint  Patient presents with   Knee Pain    [name] is a 88 y.o. female.  The history is provided by the patient.  Knee Pain Location:  Knee and hip Hip location:  R hip Knee location:  R knee Pain details:    Quality:  Aching   Radiates to:  Does not radiate   Severity:  Moderate   Onset quality:  Gradual   Timing:  Intermittent   Progression:  Waxing and waning Chronicity:  Recurrent Relieved by:  Acetaminophen Worsened by:  Bearing weight Associated symptoms: stiffness   Associated symptoms: no back pain, no decreased ROM, no fatigue, no fever, no itching, no muscle weakness, no neck pain, no numbness, no swelling and no tingling       Home Medications Prior to Admission medications   Medication Sig Start Date End Date Taking? Authorizing Provider  Accu-Chek Softclix Lancets lancets Use as instructed to check blood sugars up to 3 times a day  Dx code e11.65 Patient not taking: Reported on 04/02/2021 05/17/19   Glendale Chard, MD  acetaminophen (TYLENOL) 650 MG CR tablet Take 650 mg by mouth every 8 (eight) hours as needed for pain.    [provider]  amLODipine (NORVASC) 5 MG tablet TAKE 1 TABLET(5 MG) BY MOUTH DAILY 07/19/20   Glendale Chard, MD  Blood Glucose Calibration (ACCU-CHEK AVIVA) SOLN Use with accu-check aviva machine Patient not taking: Reported on 04/02/2021 06/02/19   Glendale Chard, MD  Blood Glucose Monitoring Suppl (ACCU-CHEK AVIVA PLUS) w/Device KIT Use to check  Blood sugars up to 3 times a day.  Dx code e11.65 Patient not taking: Reported on 04/02/2021 12/10/20   Glendale Chard, MD  carvedilol (COREG) 12.5 MG tablet TAKE 1 TABLET(12.5 MG) BY MOUTH TWICE DAILY WITH A MEAL 07/19/20   Glendale Chard, MD  chlorthalidone (HYGROTON) 25 MG tablet Take 12.5 mg by mouth daily. 10/12/19   [provider]  Cholecalciferol (VITAMIN D) 50 MCG (2000 UT) CAPS Take by mouth.    [provider]  clindamycin (CLEOCIN) 150 MG capsule For dentist appointments Patient not taking: Reported on 04/02/2021 01/15/21   [provider]  cyclobenzaprine (FLEXERIL) 5 MG tablet Take 2 tablets (10 mg total) by mouth 2 (two) times daily as needed for up to 10 doses for muscle spasms. 04/05/21   Lennice Sites, DO  dipyridamole-aspirin (AGGRENOX) 200-25 MG 12hr capsule TAKE 1 CAPSULE BY MOUTH DAILY 03/14/21   Glendale Chard, MD  glimepiride (AMARYL) 1 MG tablet TAKE 1/2 TABLET BY MOUTH ON [facility], AND FRIDAYS 01/29/21   Glendale Chard, MD  glucosamine-chondroitin 500-400 MG tablet Take 1 tablet by mouth 3 (three) times daily.    [provider]  glucose blood test strip Use as instructed to check blood sugars up to 3 times a day.  Dx code e11.65 Patient not taking: Reported on 04/02/2021 05/17/19   Glendale Chard, MD  irbesartan (AVAPRO) 150 MG tablet Take 1 tablet (150 mg total) by mouth daily. 07/19/20 07/19/21  Glendale Chard, MD  JARDIANCE 10 MG TABS tablet Take 10 mg by mouth daily. 02/01/21   [provider]  Multiple Vitamin (MULTIVITAMIN) capsule Take 1 capsule by mouth daily.    [provider]  nitroGLYCERIN (NITROSTAT) 0.4 MG SL tablet PLACE 1 TABLET UNDER THE TONGUE AT THE FIRST  SIGN OF ATTACK, MAY REPEAT EVERY 5 MINUTES FOR 3 DOSES IN 15 MINUTES. IF PAIN PERSISTS CALL 911 08/24/20   Glendale Chard, MD  rosuvastatin (CRESTOR) 5 MG tablet TAKE 1 TABLET BY MOUTH MONDAY THROUGH FRIDAY. DO NOT TAKE ON SATURDAY AND SUNDAY 01/29/21   Glendale Chard, MD      Allergies    Patient has no known allergies.    Review of Systems   Review of Systems  Constitutional:  Negative for fatigue and fever.  Musculoskeletal:  Positive for stiffness. Negative for back pain and neck pain.  Skin:  Negative for itching.   Physical Exam Updated Vital Signs BP (!)  158/64 (BP Location: Right Arm)    Pulse 78    Temp 98.4 F (36.9 C) (Oral)    Resp 15    SpO2 98%  Physical Exam Vitals and nursing note reviewed.  Constitutional:      General: She is not in acute distress.    Appearance: She is well-developed. She is not ill-appearing.  HENT:     Head: Normocephalic and atraumatic.  Eyes:     Extraocular Movements: Extraocular movements intact.     Conjunctiva/sclera: Conjunctivae normal.     Pupils: Pupils are equal, round, and reactive to light.  Cardiovascular:     Rate and Rhythm: Normal rate and regular rhythm.     Pulses: Normal pulses.     Heart sounds: No murmur heard. Pulmonary:     Effort: Pulmonary effort is normal. No respiratory distress.     Breath sounds: Normal breath sounds.  Abdominal:     Palpations: Abdomen is soft.     Tenderness: There is no abdominal tenderness.  Musculoskeletal:        General: Tenderness present. No swelling.     Cervical back: Normal range of motion and neck supple.     Comments: Tenderness to the right knee, right hip, good range of motion of the right hip and right knee without much discomfort  Skin:    General: Skin is warm and dry.     Capillary Refill: Capillary refill takes less than 2 seconds.  Neurological:     General: No focal deficit present.     Mental Status: She is alert and oriented to person, place, and time.     Cranial Nerves: No cranial nerve deficit.     Sensory: No sensory deficit.     Motor: No weakness.     Coordination: Coordination normal.     Comments: 5+ out of 5 strength, normal sensation,  Psychiatric:        Mood and Affect: Mood normal.    ED Results / Procedures / Treatments   Labs (all labs ordered are listed, but only abnormal results are displayed) Labs Reviewed  COMPREHENSIVE METABOLIC PANEL - Abnormal; Notable for the following components:      Result Value   Glucose, Bld 167 (*)    BUN 56 (*)    Creatinine, Ser 2.03 (*)    GFR, Estimated 23 (*)     All other components within normal limits  CBC WITH DIFFERENTIAL/PLATELET - Abnormal; Notable for the following components:   RBC 3.43 (*)    Hemoglobin 11.1 (*)    HCT 34.6 (*)    MCV 100.9 (*)    All other components within normal limits  RESP PANEL BY RT-PCR (FLU A&B, COVID) [id number]    EKG EKG Interpretation  Date/Time:  Friday April 05 2021 10:51:49 EST Ventricular Rate:  69 PR Interval:  154 QRS Duration: 80 QT Interval:  396 QTC Calculation: 424 R Axis:   -5 Text Interpretation: Normal sinus rhythm No significant change since No significant change since last tracing When compared with ECG of 16-May-2008 12:11, PREVIOUS ECG IS PRESENT Confirmed by Lennice Sites [phone]) on 04/05/2021 2:58:37 PM  Radiology DG Knee Complete 4 Views Right  Result Date: 04/05/2021 CLINICAL DATA:  Arthritis. EXAM: RIGHT KNEE - COMPLETE 4+ VIEW COMPARISON:  12/07/2020 FINDINGS: Severe medial compartment joint space narrowing, subchondral sclerosis, and peripheral osteophytosis. Lateral compartment chondrocalcinosis with moderate peripheral degenerative osteophytosis, similar to prior. Severe patellofemoral joint space narrowing and peripheral osteophytosis. Within the limitations of diffuse decreased bone mineralization no acute fracture is seen. No joint effusion. Mild chronic spurring at the quadriceps insertion on the patella. Dense vascular calcifications. Surgical clips again overlie the posteromedial proximal calf. IMPRESSION: Severe medial and patellofemoral compartment and moderate lateral compartment osteoarthritis. Electronically Signed   By: Yvonne Kendall M.D.   On: 04/05/2021 15:59   DG Hip Unilat With Pelvis 2-3 Views Right  Result Date: 04/05/2021 CLINICAL DATA:  Arthritis.  Pain. EXAM: DG HIP (WITH OR WITHOUT PELVIS) 2-3V RIGHT COMPARISON:  Pelvis and right hip radiographs 12/07/2020 FINDINGS: There is diffuse decreased bone mineralization. Severe right femoroacetabular joint space narrowing  with diffuse bone-on-bone contact. There is moderate right acetabular protrusio with marked thinning of the medial acetabular cortex. Moderate left femoroacetabular joint space narrowing. Severe pubic symphysis joint space narrowing. Moderate L4-5 and L5-S1 degenerative disc changes. High-grade stool within the visualized rectum and distal sigmoid colon. Dense vascular calcifications. IMPRESSION:: IMPRESSION: 1. Severe right femoroacetabular osteoarthritis with moderate right acetabular protrusio, similar to prior. 2. Moderate left femoroacetabular and pubic symphysis osteoarthritis. Electronically Signed   By: Yvonne Kendall M.D.   On: 04/05/2021 15:58    Procedures Procedures    Medications Ordered in ED Medications  acetaminophen (TYLENOL) tablet 650 mg (650 mg Oral Given 04/05/21 1559)    ED Course/ Medical Decision Making/ A&P                           Medical Decision Making Amount and/or Complexity of Data Reviewed Radiology: ordered.  Risk OTC drugs. Prescription drug management.   Aaleigha Moncus is here with for evaluation of right knee and right hip pain.  Pain fairly chronic.  Worse over the last several days.  Uses a walker to walk.  Has a history of severe arthritis in her hip and knees.  Follows with orthopedics for this.  She gets joint injections at times.  Denies any numbness.  No major weakness.  Neurologically she is intact.  She is tender in the right hip and right knee but has good range of motion with minimal pain.  Lab work was done prior to my evaluation as she also states may be some general weakness.  However she appears very well.  Suspect that this is arthritic process.  May be she is having some viral process as well.  Per my review of x-rays of her right hip and right knee.  There are no obvious acute fractures.  Evidence of severe arthritis.  Radiology report suggest the same.  Lab work shows no significant anemia.  Creatinine is at baseline.  Per my review and  interpretation they are overall unremarkable.  COVID test is negative.  She states she feels comfortable with Tylenol at home.  She did not want any narcotic pain  medicine.  She states sometimes she feels like she has spasms in her legs and will prescribe her low-dose of muscle relaxant.  We will have her follow-up with her primary care doctor.  She is got good pulses in her lower extremities.  No concern for peripheral arterial disease or blood clot.  We will have her follow-up with orthopedic and discharged in the ED in good condition.  This chart was dictated using voice recognition software.  Despite best efforts to proofread,  errors can occur which can change the documentation meaning.         Final Clinical Impression(s) / ED Diagnoses Final diagnoses:  Acute pain of right knee    Rx / DC Orders ED Discharge Orders          Ordered    cyclobenzaprine (FLEXERIL) 5 MG tablet  2 times daily PRN,   Status:  Discontinued        04/05/21 1654    cyclobenzaprine (FLEXERIL) 5 MG tablet  2 times daily PRN        04/05/21 1808              Lennice Sites, DO 04/05/21 1654    Ridwan Bondy, [place], DO 04/05/21 1808

## 2021-04-05 NOTE — ED Notes (Signed)
Left message with son about mothers discharge and to call or come to pick up.

## 2021-04-05 NOTE — ED Notes (Signed)
Son is on way to pick up mother

## 2021-04-05 NOTE — Discharge Instructions (Signed)
Follow-up with your orthopedic doctor.  Continue Tylenol at home.  I have prescribed you a muscle Iraksin to take when you have spasms in your legs.  This medicine can make you slightly sedated so please be careful with this use.

## 2021-04-05 NOTE — ED Provider Triage Note (Signed)
Emergency Medicine Provider Triage Evaluation Note  [name] , a 88 y.o. female  was evaluated in triage.  Pt complains of weakness in her legs.  She states that about 6 years ago she had a rheumatoid arthritis flare and developed severe leg pain and weakness at that time.  She was never put on chronic medication and was told to return to be seen if she develops similar symptoms again.  She states for about the past week or 2, she has had worsening pain in her bilateral thighs.  She states that every time she stands up she feels like her legs are getting give out and feels very weak all over to the point where she is not able to ambulate well.  She says this does feel similar to the RA flare that she had previously, but this time she feels weak everywhere.  She denies any chest pain, shortness of breath, abdominal pain, nausea, vomiting, diarrhea, flank pain, headaches, fevers, chills, cough, congestion, sore throat.  Review of Systems  Positive: generalized weakness, arthralgias Negative: She denies any chest pain, shortness of breath, abdominal pain, nausea, vomiting, diarrhea, flank pain, headaches, fevers, chills, cough, congestion, sore throat.  Physical Exam  BP (!) 164/62 (BP Location: Right Arm)    Pulse 68    Temp 98.9 F (37.2 C) (Oral)    Resp 17    SpO2 100%  Gen:   Awake, no distress   Resp:  Normal effort  MSK:   Moves extremities without difficulty  Other:    Medical Decision Making  Medically screening exam initiated at 10:44 AM.  Appropriate orders placed.  Yarel Navarro was informed that the remainder of the evaluation will be completed by another provider, this initial triage assessment does not replace that evaluation, and the importance of remaining in the ED until their evaluation is complete.  Unclear whether this is from an arthritis flare, versus another process causing weakness. Ordered basic labs and EKG. Patient will need further evaluation in the back.     Adolphus Birchwood, PA-C 04/05/21 1047

## 2021-04-05 NOTE — ED Triage Notes (Signed)
Pt here from with c/o generalized pain all over , 6 years ago pt was dx with Rheumatoid arthritis but hasnt taken anything but tylenol for but that has stopped working

## 2021-04-08 ENCOUNTER — Encounter: Payer: Self-pay | Admitting: Internal Medicine

## 2021-04-08 ENCOUNTER — Telehealth: Payer: Self-pay

## 2021-04-08 NOTE — Telephone Encounter (Signed)
Attempted to call pt twice, per provider make sure she has ortho appointment scheduled.

## 2021-04-09 ENCOUNTER — Encounter: Payer: Self-pay | Admitting: Orthopaedic Surgery

## 2021-04-09 ENCOUNTER — Telehealth: Payer: Medicare Other

## 2021-04-09 ENCOUNTER — Ambulatory Visit (INDEPENDENT_AMBULATORY_CARE_PROVIDER_SITE_OTHER): Payer: Medicare Other | Admitting: Orthopaedic Surgery

## 2021-04-09 ENCOUNTER — Other Ambulatory Visit: Payer: Self-pay

## 2021-04-09 ENCOUNTER — Telehealth: Payer: Self-pay

## 2021-04-09 DIAGNOSIS — I2510 Atherosclerotic heart disease of native coronary artery without angina pectoris: Secondary | ICD-10-CM | POA: Diagnosis not present

## 2021-04-09 DIAGNOSIS — M1611 Unilateral primary osteoarthritis, right hip: Secondary | ICD-10-CM | POA: Diagnosis not present

## 2021-04-09 DIAGNOSIS — M1711 Unilateral primary osteoarthritis, right knee: Secondary | ICD-10-CM

## 2021-04-09 MED ORDER — PREDNISONE 5 MG (21) PO TBPK: ORAL_TABLET | ORAL | 0 refills | Status: DC

## 2021-04-09 MED ORDER — TRAMADOL HCL 50 MG PO TABS: 50.0000 mg | ORAL_TABLET | Freq: Two times a day (BID) | ORAL | 2 refills | Status: DC | PRN

## 2021-04-09 MED ORDER — CYCLOBENZAPRINE HCL 5 MG PO TABS: 5.0000 mg | ORAL_TABLET | Freq: Two times a day (BID) | ORAL | 0 refills | Status: DC | PRN

## 2021-04-09 NOTE — Progress Notes (Signed)
Office Visit Note   Patient: [name]           Date of Birth: 02/26/1927           MRN: [id number] Visit Date: 04/09/2021              Requested by: Glendale Chard, [address] [place],  [place] [address] PCP: Glendale Chard, MD   Assessment & Plan: Visit Diagnoses:  1. Unilateral primary osteoarthritis, right hip   2. Unilateral primary osteoarthritis, right knee     Plan: Impression is advanced degenerative joint disease right knee and right hip in addition to possible right lower extremity sciatica.  The patient had relief only during the anesthetic phase from the recent hip and knee injections.  She has not previously undergone viscosupplementation injection.  We would like to get approval for this.  I would also like to call in a steroid taper and tramadol in addition to Flexeril which she was given from the ED which seemed to somewhat help her symptoms.  She will follow-up with [place] once approved for the Visco injection.  This patient is diagnosed with osteoarthritis of the knee(s).    Radiographs show evidence of joint space narrowing, osteophytes, subchondral sclerosis and/or subchondral cysts.  This patient has knee pain which interferes with functional and activities of daily living.    This patient has experienced inadequate response, adverse effects and/or intolerance with conservative treatments such as acetaminophen, NSAIDS, topical creams, physical therapy or regular exercise, knee bracing and/or weight loss.   This patient has experienced inadequate response or has a contraindication to intra articular steroid injections for at least 3 months.   This patient is not scheduled to have a total knee replacement within 6 months of starting treatment with viscosupplementation.   Follow-Up Instructions: Return for once approved for right knee visco inj.   Orders:  No orders of the defined types were placed in this encounter.  Meds ordered this  encounter  Medications   predniSONE (STERAPRED UNI-PAK 21 TAB) 5 MG (21) TBPK tablet    Sig: Take as directed    Dispense:  21 tablet    Refill:  0   cyclobenzaprine (FLEXERIL) 5 MG tablet    Sig: Take 1 tablet (5 mg total) by mouth 2 (two) times daily between meals as needed for muscle spasms.    Dispense:  20 tablet    Refill:  0   traMADol (ULTRAM) 50 MG tablet    Sig: Take 1 tablet (50 mg total) by mouth every 12 (twelve) hours as needed.    Dispense:  30 tablet    Refill:  2      Procedures: No procedures performed   Clinical Data: No additional findings.   Subjective: Chief Complaint  Patient presents with   Right Knee - Pain    HPI patient is a very pleasant 88 year old female who is here today with her son.  She is here with continued right knee pain.  She has an underlying history of advanced degenerative joint disease to the right knee and right hip.  Both joints have been injected over the past few months which provided relief only during the anesthetic phase.  She continues to have pain to the right groin, thigh and right knee and now into the right buttock.  She notes that the pain in the knee is inhibiting her from walking.  She has not been taking anything but Tylenol for this which does not  provide relief.  We have previously discussed surgical intervention, but due to her age and medical multiple comorbidities she is not interested.  Review of Systems as detailed in HPI.  All others reviewed and are negative.   Objective: Vital Signs: There were no vitals taken for this visit.  Physical Exam well-developed well-nourished female no acute distress.  Alert and oriented x3.  Ortho Exam right hip exam shows a positive logroll.  Positive straight leg raise.  Right knee exam shows a small effusion.  Medial joint line tenderness.  Range of motion 0 to 90 degrees.  No focal weakness.  She is neurovascular intact distally.  Specialty Comments:  No specialty comments  available.  Imaging: No new imaging   PMFS History: Patient Active Problem List   Diagnosis Date Noted   Peripheral arterial disease ([place]) 03/17/2021   Onychomycosis 02/28/2021   Right leg pain 07/26/2020   Onychodystrophy 07/26/2020   CKD (chronic kidney disease) stage 4, GFR 15-29 ml/min ([facility]) 07/19/2020   Hypertensive heart and renal disease 03/22/2020   Gout 12/07/2017   Type 2 diabetes mellitus with stage 3 chronic kidney disease, without long-term current use of insulin ([place]) 02/11/2014   History of coronary artery bypass graft 07/01/2010   MYOCARDIAL INFARCTION, INFERIOR WALL, SUBSEQUENT CARE 05/12/2008   CAD, NATIVE VESSEL 05/12/2008   HYPERLIPIDEMIA-MIXED 05/11/2008   Essential hypertension 05/11/2008   CAD, UNSPECIFIED SITE 05/11/2008   CEREBROVASCULAR DISEASE 05/11/2008   Rheumatoid arthritis ([place]) 05/11/2008   Past Medical History:  Diagnosis Date   Cerebrovascular disease, unspecified    Coronary atherosclerosis of unspecified type of vessel, native or graft    Diabetes mellitus without complication ([place])    Other and unspecified hyperlipidemia    Rheumatoid arthritis(714.0)    Shingles 2003   Unspecified essential hypertension     Family History  Problem Relation Age of Onset   Cancer Mother    Hypertension Father    Cancer Brother    Hypertension Paternal Grandmother    Heart disease Brother    Diabetes Neg Hx    Coronary artery disease Neg Hx     Past Surgical History:  Procedure Laterality Date   Cardiac Bypass  2003   CATARACT EXTRACTION, BILATERAL  2007   Social History   Occupational History   Occupation: retired  Tobacco Use   Smoking status: Never   Smokeless tobacco: Never  Vaping Use   Vaping Use: Never used  Substance and Sexual Activity   Alcohol use: No   Drug use: No   Sexual activity: Not Currently

## 2021-04-09 NOTE — Telephone Encounter (Signed)
°  Care Management   Follow Up Note   04/09/2021 Name: [name] MRN: [id number] DOB: [birth year]   Referred by: Glendale Chard, MD Reason for referral : Chronic Care Management (Unsuccessful call)   An unsuccessful telephone outreach was attempted today. The patient was referred to the case management team for assistance with care management and care coordination.   Follow Up Plan: The care management team will reach out to the patient again over the next 14 days.   Daneen Schick, BSW, CDP Social Worker, Certified Dementia Practitioner [facility] [phone]

## 2021-04-11 ENCOUNTER — Telehealth: Payer: Self-pay

## 2021-04-11 NOTE — Telephone Encounter (Signed)
Please submit for right knee gel inj 

## 2021-04-11 NOTE — Telephone Encounter (Signed)
Noted  

## 2021-04-16 DIAGNOSIS — I1310 Hypertensive heart and chronic kidney disease without heart failure, with stage 1 through stage 4 chronic kidney disease, or unspecified chronic kidney disease: Secondary | ICD-10-CM | POA: Diagnosis not present

## 2021-04-16 DIAGNOSIS — E1122 Type 2 diabetes mellitus with diabetic chronic kidney disease: Secondary | ICD-10-CM

## 2021-04-16 DIAGNOSIS — M069 Rheumatoid arthritis, unspecified: Secondary | ICD-10-CM

## 2021-04-16 DIAGNOSIS — I2510 Atherosclerotic heart disease of native coronary artery without angina pectoris: Secondary | ICD-10-CM

## 2021-04-16 DIAGNOSIS — N184 Chronic kidney disease, stage 4 (severe): Secondary | ICD-10-CM | POA: Diagnosis not present

## 2021-04-18 ENCOUNTER — Ambulatory Visit (INDEPENDENT_AMBULATORY_CARE_PROVIDER_SITE_OTHER): Payer: Medicare Other

## 2021-04-18 DIAGNOSIS — N184 Chronic kidney disease, stage 4 (severe): Secondary | ICD-10-CM

## 2021-04-18 DIAGNOSIS — I2510 Atherosclerotic heart disease of native coronary artery without angina pectoris: Secondary | ICD-10-CM

## 2021-04-18 DIAGNOSIS — E1122 Type 2 diabetes mellitus with diabetic chronic kidney disease: Secondary | ICD-10-CM

## 2021-04-18 DIAGNOSIS — I739 Peripheral vascular disease, unspecified: Secondary | ICD-10-CM

## 2021-04-18 DIAGNOSIS — I10 Essential (primary) hypertension: Secondary | ICD-10-CM

## 2021-04-18 NOTE — Chronic Care Management (AMB) (Signed)
?Chronic Care Management  ? ? Social Work Note ? ?04/18/2021 ?Name: [name] MRN: [id number] DOB: May 19, 1927 ? ?[name] is a 88 y.o. year old female who is a primary care patient of Glendale Chard, MD. The CCM team was consulted to assist the patient with chronic disease management and/or care coordination needs related to:  DM, CKD III, HTN, PAD, CAD .  ? ?Engaged with patient by telephone for follow up visit in response to provider referral for social work chronic care management and care coordination services.  ? ?Consent to Services:  ?The patient was given information about Chronic Care Management services, agreed to services, and gave verbal consent prior to initiation of services.  Please see initial visit note for detailed documentation.  ? ?Patient agreed to services and consent obtained.  ? ?Assessment: Review of patient past medical history, allergies, medications, and health status, including review of relevant consultants reports was performed today as part of a comprehensive evaluation and provision of chronic care management and care coordination services.    ? ?SDOH (Social Determinants of Health) assessments and interventions performed:  ?SDOH Interventions   ? ?Flowsheet Row Most Recent Value  ?SDOH Interventions   ?Food Insecurity Interventions Intervention Not Indicated  ?Housing Interventions Intervention Not Indicated  ?Transportation Interventions Intervention Not Indicated  ? ?  ?  ? ?Advanced Directives Status: Not addressed in this encounter. ? ?CCM Care Plan ? ?No Known Allergies ? ?Outpatient Encounter Medications as of 04/18/2021  ?Medication Sig  ? Accu-Chek Softclix Lancets lancets Use as instructed to check blood sugars up to 3 times a day ? ?Dx code e11.65  ? acetaminophen (TYLENOL) 650 MG CR tablet Take 650 mg by mouth every 8 (eight) hours as needed for pain.  ? amLODipine (NORVASC) 5 MG tablet TAKE 1 TABLET(5 MG) BY MOUTH DAILY  ? Blood Glucose Calibration (ACCU-CHEK  AVIVA) SOLN Use with accu-check aviva machine  ? Blood Glucose Monitoring Suppl (ACCU-CHEK AVIVA PLUS) w/Device KIT Use to check  Blood sugars up to 3 times a day. ? ?Dx code e11.65  ? carvedilol (COREG) 12.5 MG tablet TAKE 1 TABLET(12.5 MG) BY MOUTH TWICE DAILY WITH A MEAL  ? chlorthalidone (HYGROTON) 25 MG tablet Take 12.5 mg by mouth daily.  ? Cholecalciferol (VITAMIN D) 50 MCG (2000 UT) CAPS Take by mouth.  ? clindamycin (CLEOCIN) 150 MG capsule For dentist appointments  ? cyclobenzaprine (FLEXERIL) 5 MG tablet Take 1 tablet (5 mg total) by mouth 2 (two) times daily between meals as needed for muscle spasms.  ? dipyridamole-aspirin (AGGRENOX) 200-25 MG 12hr capsule TAKE 1 CAPSULE BY MOUTH DAILY  ? glimepiride (AMARYL) 1 MG tablet TAKE 1/2 TABLET BY MOUTH ON MONDAYS, WEDNESDAYS, AND FRIDAYS  ? glucosamine-chondroitin 500-400 MG tablet Take 1 tablet by mouth 3 (three) times daily.  ? glucose blood test strip Use as instructed to check blood sugars up to 3 times a day. ? ?Dx code e11.65  ? irbesartan (AVAPRO) 150 MG tablet Take 1 tablet (150 mg total) by mouth daily.  ? JARDIANCE 10 MG TABS tablet Take 10 mg by mouth daily.  ? Multiple Vitamin (MULTIVITAMIN) capsule Take 1 capsule by mouth daily.  ? nitroGLYCERIN (NITROSTAT) 0.4 MG SL tablet PLACE 1 TABLET UNDER THE TONGUE AT THE FIRST SIGN OF ATTACK, MAY REPEAT EVERY 5 MINUTES FOR 3 DOSES IN 15 MINUTES. IF PAIN PERSISTS CALL 911  ? predniSONE (STERAPRED UNI-PAK 21 TAB) 5 MG (21) TBPK tablet Take as directed  ? rosuvastatin (CRESTOR)  5 MG tablet TAKE 1 TABLET BY MOUTH MONDAY THROUGH FRIDAY. DO NOT TAKE ON SATURDAY AND SUNDAY  ? traMADol (ULTRAM) 50 MG tablet Take 1 tablet (50 mg total) by mouth every 12 (twelve) hours as needed.  ? ?No facility-administered encounter medications on file as of 04/18/2021.  ? ? ?Patient Active Problem List  ? Diagnosis Date Noted  ? Peripheral arterial disease ([place]) 03/17/2021  ? Onychomycosis 02/28/2021  ? Right leg pain 07/26/2020  ?  Onychodystrophy 07/26/2020  ? CKD (chronic kidney disease) stage 4, GFR 15-29 ml/min ([facility]) 07/19/2020  ? Hypertensive heart and renal disease 03/22/2020  ? Gout 12/07/2017  ? Type 2 diabetes mellitus with stage 3 chronic kidney disease, without long-term current use of insulin ([place]) 02/11/2014  ? History of coronary artery bypass graft 07/01/2010  ? MYOCARDIAL INFARCTION, INFERIOR WALL, SUBSEQUENT CARE 05/12/2008  ? CAD, NATIVE VESSEL 05/12/2008  ? HYPERLIPIDEMIA-MIXED 05/11/2008  ? Essential hypertension 05/11/2008  ? CAD, UNSPECIFIED SITE 05/11/2008  ? CEREBROVASCULAR DISEASE 05/11/2008  ? Rheumatoid arthritis ([place]) 05/11/2008  ? ? ?Conditions to be addressed/monitored: CAD, HTN, DMII, CKD Stage III, and PAD ;  Limited Resource knowledge ? ?Care Plan : Social Work Plan of Care  ?Updates made by Daneen Schick since 04/18/2021 12:00 AM  ?  ? ?Problem: Mobility and Independence   ?  ? ?Goal: Mobility and Independence Optimized   ?Start Date: 04/18/2021  ?Priority: High  ?Note:   ?Current Barriers:  ?Chronic disease management support and education needs related to HTN, DM, CKD Stage III, and PAD   ?Limited knowledge of how to purchase a personal emergency response system ? ?Social Worker Clinical Goal(s):  ?patient will work with SW to identify and address any acute and/or chronic care coordination needs related to the self health management of CAD, HTN, DM, CKD Stage III, and PAD   ?Work with SW to explore options to obtain a personal emergency response system ?SW Interventions:  ?Inter-disciplinary care team collaboration (see longitudinal plan of care) ?Collaboration with Glendale Chard, MD regarding development and update of comprehensive plan of care as evidenced by provider attestation and co-signature ?Collaboration with RN Care Manager who requests SW outreach to assist the patient with obtaining a personal emergency response system (PERS) ?Telephonic visit completed with the patient to determine the patients  health plan does not assist with the cost of a PERS ?Educated the patient on options to purchase this equipment by paying a monthly fee ?Determined the patient is able to afford a monthly cost to obtain a PERS ?Discussed plan for SW to mail the patient information on the [place] for review ?Scheduled follow up call over the next 3 weeks ? ?Patient Goals/Self-Care Activities ?patient will:  ? -  Review mailed resource information ?-Contact SW as needed prior to next scheduled call ? ?Follow Up Plan: The care management team will reach out to the patient again over the next 21 days.  ? ?  ?  ? ?Follow Up Plan: SW will follow up with patient by phone over the next 21 days ?     ?Daneen Schick, BSW, CDP ?Social Worker, Certified Dementia Practitioner ?[facility] ?[phone] ? ?   ? ? ? ? ?

## 2021-04-18 NOTE — Patient Instructions (Signed)
Social Worker Visit Information ? ?Goals we discussed today:  ?Patient Goals/Self-Care Activities ?patient will:  ? - Review mailed resource information ?-Contact SW as needed prior to next scheduled call ? ?Materials Provided: Yes: verbal and written resource education provided to the patient ? ?The patient verbalized understanding of instructions, educational materials, and care plan provided today and declined offer to receive copy of patient instructions, educational materials, and care plan.  ? ?Follow Up Plan: SW will follow up with patient by phone over the next three weeks ? ? ?Daneen Schick, BSW, CDP ?Social Worker, Certified Dementia Practitioner ?[facility] ?[phone] ? ?   ?  ?

## 2021-04-22 ENCOUNTER — Telehealth: Payer: Self-pay

## 2021-04-22 NOTE — Progress Notes (Unsigned)
Patient ID: [name], female   DOB: 11-Sep-1927, 88 y.o.   MRN: [id number]     Cardiology Office Note   Date:  04/22/2021   ID:  [name], DOB Mar 03, 1927, MRN [id number]  PCP:  Glendale Chard, MD  Cardiologist:   Jenkins Rouge, MD   No chief complaint on file.     History of Present Illness: [name] is a 88 y.o. female who presents for f/u  of CAD. Distant history of CABG complicated by CVA.  Last cath 06/2010 with patent grafts diffuse distal LAD disease   SVG OM/D1 sequential SVG PDA LIMA to LAD  EF normal at that time    CRF;s HTN and elevated lipids on Rx  Has been on Aggrenox since stroke 09/08/13 Carotid 12/08/17  plaque no stenosis  Living independently  Ambulates with cain.  DM under good control   No longer driving . Living independently has one son living in [place] and one in [place]   No angina mild edema in RLE   Baseline Cr 1.87   Son lives next door and looks in on her  She has a little terrier mix that's good company for her   Significant right hip arthritis SEen in ED 04/09/21 Has had injections in both hip and knee Has not had visco-supplementation injection Had oral steroid taper  ***   Past Medical History:  Diagnosis Date   Cerebrovascular disease, unspecified    Coronary atherosclerosis of unspecified type of vessel, native or graft    Diabetes mellitus without complication ([place])    Other and unspecified hyperlipidemia    Rheumatoid arthritis(714.0)    Shingles 2003   Unspecified essential hypertension     Past Surgical History:  Procedure Laterality Date   Cardiac Bypass  2003   CATARACT EXTRACTION, BILATERAL  2007     Current Outpatient Medications  Medication Sig Dispense Refill   Accu-Chek Softclix Lancets lancets Use as instructed to check blood sugars up to 3 times a day  Dx code e11.65 100 each 12   acetaminophen (TYLENOL) 650 MG CR tablet Take 650 mg by mouth every 8 (eight) hours as needed for pain.      amLODipine (NORVASC) 5 MG tablet TAKE 1 TABLET(5 MG) BY MOUTH DAILY 90 tablet 2   Blood Glucose Calibration (ACCU-CHEK AVIVA) SOLN Use with accu-check aviva machine 1 each 3   Blood Glucose Monitoring Suppl (ACCU-CHEK AVIVA PLUS) w/Device KIT Use to check  Blood sugars up to 3 times a day.  Dx code e11.65 1 kit 3   carvedilol (COREG) 12.5 MG tablet TAKE 1 TABLET(12.5 MG) BY MOUTH TWICE DAILY WITH A MEAL 180 tablet 1   chlorthalidone (HYGROTON) 25 MG tablet Take 12.5 mg by mouth daily.     Cholecalciferol (VITAMIN D) 50 MCG (2000 UT) CAPS Take by mouth.     clindamycin (CLEOCIN) 150 MG capsule For dentist appointments     cyclobenzaprine (FLEXERIL) 5 MG tablet Take 1 tablet (5 mg total) by mouth 2 (two) times daily between meals as needed for muscle spasms. 20 tablet 0   dipyridamole-aspirin (AGGRENOX) 200-25 MG 12hr capsule TAKE 1 CAPSULE BY MOUTH DAILY 180 capsule 1   glimepiride (AMARYL) 1 MG tablet TAKE 1/2 TABLET BY MOUTH ON MONDAYS, WEDNESDAYS, AND FRIDAYS 36 tablet 1   glucosamine-chondroitin 500-400 MG tablet Take 1 tablet by mouth 3 (three) times daily.     glucose blood test strip Use as instructed to check blood sugars up to 3 times  a day.  Dx code e11.65 100 each 12   irbesartan (AVAPRO) 150 MG tablet Take 1 tablet (150 mg total) by mouth daily. 90 tablet 2   JARDIANCE 10 MG TABS tablet Take 10 mg by mouth daily.     Multiple Vitamin (MULTIVITAMIN) capsule Take 1 capsule by mouth daily.     nitroGLYCERIN (NITROSTAT) 0.4 MG SL tablet PLACE 1 TABLET UNDER THE TONGUE AT THE FIRST SIGN OF ATTACK, MAY REPEAT EVERY 5 MINUTES FOR 3 DOSES IN 15 MINUTES. IF PAIN PERSISTS CALL 911 50 tablet 0   predniSONE (STERAPRED UNI-PAK 21 TAB) 5 MG (21) TBPK tablet Take as directed 21 tablet 0   rosuvastatin (CRESTOR) 5 MG tablet TAKE 1 TABLET BY MOUTH [place]. DO NOT TAKE ON SATURDAY AND SUNDAY 90 tablet 1   traMADol (ULTRAM) 50 MG tablet Take 1 tablet (50 mg total) by mouth every 12  (twelve) hours as needed. 30 tablet 2   No current facility-administered medications for this visit.    Allergies:   Patient has no known allergies.    Social History:  The patient  reports that she has never smoked. She has never used smokeless tobacco. She reports that she does not drink alcohol and does not use drugs.   Family History:  The patient's family history includes Cancer in her brother and mother; Heart disease in her brother; Hypertension in her father and paternal grandmother.    ROS:  Please see the history of present illness.   Otherwise, review of systems are positive for none.   All other systems are reviewed and negative.    PHYSICAL EXAM: VS:  There were no vitals taken for this visit. , BMI There is no height or weight on file to calculate BMI.  Affect appropriate Healthy:  appears stated age 18: normal Neck supple with no adenopathy JVP normal no bruits no thyromegaly Lungs clear with no wheezing and good diaphragmatic motion Heart:  S1/S2 no murmur, no rub, gallop or click PMI normal Abdomen: benighn, BS positve, no tenderness, no AAA no bruit.  No HSM or HJR Distal pulses intact with no bruits No edema Neuro non-focal Skin warm and dry No muscular weakness Plus one RLE edema     EKG:   10/25/13  SR rate 68 normal ECG  11/02/14  SR rate 65 nonspecific ST changes essentially normal  12/17/16 SR rate 66 nonspecific ST changes  01/25/18 SR rate 66 normal   Recent Labs: 07/19/2020: TSH 2.080 04/05/2021: ALT 14; BUN 56; Creatinine, Ser 2.03; Hemoglobin 11.1; Platelets 195; Potassium 4.5; Sodium 139    Lipid Panel    Component Value Date/Time   CHOL 129 07/19/2020 1116   TRIG 120 07/19/2020 1116   HDL 31 (L) 07/19/2020 1116   CHOLHDL 4.2 07/19/2020 1116   LDLCALC 76 07/19/2020 1116      Wt Readings from Last 3 Encounters:  03/14/21 136 lb 0.4 oz (61.7 kg)  03/14/21 136 lb (61.7 kg)  11/14/20 137 lb 9.6 oz (62.4 kg)      Other studies  Reviewed: Additional studies/ records that were reviewed today include: Epic notes and old CABG report . Labs 11/07/19    ASSESSMENT AND PLAN:  1. CAD/CABG: CABG 2003 with patent grafts on cath 2012 no angina continue medical Rx 2. Carotid:  Duplex 12/08/17  plaque no stenosis observe given age  26. HTN:.  Well controlled.  Continue current medications and low sodium Dash type diet.   4. DM:  Discussed low carb diet.  Target hemoglobin A1c is 6.5 or less.  Continue current medications. 5. Chol: continue crestor LDL 76 reasonable given age  38. Edema:  RLE from SVG harvest chronic Has diuretic continue hygroton 7. CRF:  Cr around  2.0 04/05/21  do not escalate diuretic f/u nephrology   Current medicines are reviewed at length with the patient today.  The patient does not have concerns regarding medicines.  The following changes have been made:  no change  Labs/ tests ordered today include:   None     No orders of the defined types were placed in this encounter.    Disposition:   FU with me in a year      Signed, Jenkins Rouge, MD  04/22/2021 9:58 AM    [address], [place]  [address] Phone: [phone]; Fax: [phone]

## 2021-04-22 NOTE — Telephone Encounter (Signed)
The pt was notified to contact the orthpedic specialist to let them know that she is still having the sciatic pain and that dr sanders said to take a tylenol when she takes the tramadol. ?

## 2021-04-24 ENCOUNTER — Other Ambulatory Visit: Payer: Self-pay | Admitting: Internal Medicine

## 2021-04-24 DIAGNOSIS — N183 Chronic kidney disease, stage 3 unspecified: Secondary | ICD-10-CM

## 2021-04-24 DIAGNOSIS — E1122 Type 2 diabetes mellitus with diabetic chronic kidney disease: Secondary | ICD-10-CM

## 2021-05-06 ENCOUNTER — Encounter: Payer: Self-pay | Admitting: Cardiovascular Disease

## 2021-05-06 ENCOUNTER — Ambulatory Visit (INDEPENDENT_AMBULATORY_CARE_PROVIDER_SITE_OTHER): Payer: Medicare Other | Admitting: Cardiovascular Disease

## 2021-05-06 ENCOUNTER — Other Ambulatory Visit: Payer: Self-pay

## 2021-05-06 VITALS — BP 130/62 | HR 72 | Ht 64.0 in | Wt 132.0 lb

## 2021-05-06 DIAGNOSIS — E782 Mixed hyperlipidemia: Secondary | ICD-10-CM | POA: Diagnosis not present

## 2021-05-06 DIAGNOSIS — M79604 Pain in right leg: Secondary | ICD-10-CM | POA: Diagnosis not present

## 2021-05-06 DIAGNOSIS — Z951 Presence of aortocoronary bypass graft: Secondary | ICD-10-CM | POA: Diagnosis not present

## 2021-05-06 DIAGNOSIS — I2510 Atherosclerotic heart disease of native coronary artery without angina pectoris: Secondary | ICD-10-CM | POA: Diagnosis not present

## 2021-05-06 DIAGNOSIS — I10 Essential (primary) hypertension: Secondary | ICD-10-CM | POA: Diagnosis not present

## 2021-05-06 NOTE — Patient Instructions (Signed)
Medication Instructions:  ?Your physician recommends that you continue on your current medications as directed. Please refer to the Current Medication list given to you today. ? ?*If you need a refill on your cardiac medications before your next appointment, please call your pharmacy* ? ?Lab Work: ?If you have labs (blood work) drawn today and your tests are completely normal, you will receive your results only by: ?MyChart Message (if you have MyChart) OR ?A paper copy in the mail ?If you have any lab test that is abnormal or we need to change your treatment, we will call you to review the results. ? ?Testing/Procedures: ?None ordered ? ?Follow-Up: ?At [facility], you and your health needs are our priority.  As part of our continuing mission to provide you with exceptional heart care, we have created designated Provider Care Teams.  These Care Teams include your primary Cardiologist (physician) and Advanced Practice Providers (APPs -  Physician Assistants and Nurse Practitioners) who all work together to provide you with the care you need, when you need it. ? ?We recommend signing up for the patient portal called "MyChart".  Sign up information is provided on this After Visit Summary.  MyChart is used to connect with patients for Virtual Visits (Telemedicine).  Patients are able to view lab/test results, encounter notes, upcoming appointments, etc.  Non-urgent messages can be sent to your provider as well.   ?To learn more about what you can do with MyChart, go to [email or web address].   ? ?Your next appointment:   ?1 year(s) ? ?The format for your next appointment:   ?In Person ? ?Provider:   ?Jenkins Rouge, MD { ? ? ?

## 2021-05-09 ENCOUNTER — Other Ambulatory Visit: Payer: Self-pay

## 2021-05-09 ENCOUNTER — Ambulatory Visit
Admission: EM | Admit: 2021-05-09 | Discharge: 2021-05-09 | Disposition: A | Discharge status: Home / Self Care | Payer: Medicare Other | Attending: Physician Assistant | Admitting: Physician Assistant

## 2021-05-09 ENCOUNTER — Telehealth: Payer: Medicare Other

## 2021-05-09 DIAGNOSIS — M5431 Sciatica, right side: Secondary | ICD-10-CM | POA: Diagnosis not present

## 2021-05-09 MED ORDER — PREDNISONE 20 MG PO TABS
40.0000 mg | ORAL_TABLET | Freq: Every day | ORAL | 0 refills | Status: AC
Start: 2021-05-09 — End: 2021-05-14

## 2021-05-09 NOTE — ED Triage Notes (Signed)
Pt reports right side and leg pain. She thinks she has sciatica. She reports the pain is recurrent. ?

## 2021-05-09 NOTE — ED Provider Notes (Signed)
?[facility] ? ? ? ?CSN: [id number] ?Arrival date & time: 05/09/21  0831 ? ? ?  ? ?History   ?Chief Complaint ?No chief complaint on file. ? ? ?HPI ?[name] is a 88 y.o. female.  ? ?Patient here today for evaluation of right-sided lower back and leg pain.  She reports that symptoms seem similar to sciatica that she has had in the past.  She notes that pain does radiate from her lower back into her foot.  She denies any numbness or tingling.  She does use a rolling walker for ambulation given previous stroke but does not report any new weakness.  She has tried taking tramadol she was previously prescribed with mild relief. ? ?The history is provided by the patient.  ? ?Past Medical History:  ?Diagnosis Date  ? Cerebrovascular disease, unspecified   ? Coronary atherosclerosis of unspecified type of vessel, native or graft   ? Diabetes mellitus without complication ([place])   ? Other and unspecified hyperlipidemia   ? Rheumatoid arthritis(714.0)   ? Shingles 2003  ? Unspecified essential hypertension   ? ? ?Patient Active Problem List  ? Diagnosis Date Noted  ? Peripheral arterial disease ([place]) 03/17/2021  ? Onychomycosis 02/28/2021  ? Right leg pain 07/26/2020  ? Onychodystrophy 07/26/2020  ? CKD (chronic kidney disease) stage 4, GFR 15-29 ml/min (HCC) 07/19/2020  ? Hypertensive heart and renal disease 03/22/2020  ? Gout 12/07/2017  ? Type 2 diabetes mellitus with stage 3 chronic kidney disease, without long-term current use of insulin ([place]) 02/11/2014  ? History of coronary artery bypass graft 07/01/2010  ? MYOCARDIAL INFARCTION, INFERIOR WALL, SUBSEQUENT CARE 05/12/2008  ? CAD, NATIVE VESSEL 05/12/2008  ? HYPERLIPIDEMIA-MIXED 05/11/2008  ? Essential hypertension 05/11/2008  ? CAD, UNSPECIFIED SITE 05/11/2008  ? CEREBROVASCULAR DISEASE 05/11/2008  ? Rheumatoid arthritis ([place]) 05/11/2008  ? ? ?Past Surgical History:  ?Procedure Laterality Date  ? Cardiac Bypass  2003  ? CATARACT EXTRACTION, BILATERAL   2007  ? ? ?OB History   ?No obstetric history on file. ?  ? ? ? ?Home Medications   ? ?Prior to Admission medications   ?Medication Sig Start Date End Date Taking? Authorizing Provider  ?predniSONE (DELTASONE) 20 MG tablet Take 2 tablets (40 mg total) by mouth daily with breakfast for 5 days. 05/09/21 05/14/21 Yes Francene Finders, PA-C  ?Accu-Chek Softclix Lancets lancets Use as instructed to check blood sugars up to 3 times a day ? ?Dx code e11.65 05/17/19   Glendale Chard, MD  ?acetaminophen (TYLENOL) 650 MG CR tablet Take 650 mg by mouth every 8 (eight) hours as needed for pain.    [provider]  ?amLODipine (NORVASC) 5 MG tablet TAKE 1 TABLET(5 MG) BY MOUTH DAILY 04/24/21   Glendale Chard, MD  ?Blood Glucose Calibration (ACCU-CHEK AVIVA) SOLN Use with accu-check aviva machine 06/02/19   Glendale Chard, MD  ?Blood Glucose Monitoring Suppl (ACCU-CHEK AVIVA PLUS) w/Device KIT Use to check  Blood sugars up to 3 times a day. ? ?Dx code e11.65 12/10/20   Glendale Chard, MD  ?carvedilol (COREG) 12.5 MG tablet TAKE 1 TABLET(12.5 MG) BY MOUTH TWICE DAILY WITH A MEAL 07/19/20   Glendale Chard, MD  ?chlorthalidone (HYGROTON) 25 MG tablet Take 12.5 mg by mouth daily. 10/12/19   [provider]  ?Cholecalciferol (VITAMIN D) 50 MCG (2000 UT) CAPS Take by mouth.    [provider]  ?clindamycin (CLEOCIN) 150 MG capsule For dentist appointments 01/15/21   [provider]  ?cyclobenzaprine (FLEXERIL) 5 MG tablet Take 1 tablet (5 mg total) by mouth 2 (two) times daily between meals as needed for muscle spasms. 04/09/21   Aundra Dubin, PA-C  ?dipyridamole-aspirin (AGGRENOX) 200-25 MG 12hr capsule TAKE 1 CAPSULE BY MOUTH DAILY 03/14/21   Glendale Chard, MD  ?glimepiride (AMARYL) 1 MG tablet TAKE 1/2 TABLET BY MOUTH ON [place] [facility], AND FRIDAYS 04/24/21   Glendale Chard, MD  ?glucosamine-chondroitin 500-400 MG tablet Take 1 tablet by mouth 3 (three) times daily.    [provider]   ?glucose blood test strip Use as instructed to check blood sugars up to 3 times a day. ? ?Dx code e11.65 05/17/19   Glendale Chard, MD  ?irbesartan (AVAPRO) 150 MG tablet Take 1 tablet (150 mg total) by mouth daily. 07/19/20 07/19/21  Glendale Chard, MD  ?JARDIANCE 10 MG TABS tablet Take 10 mg by mouth daily. 02/01/21   [provider]  ?Multiple Vitamin (MULTIVITAMIN) capsule Take 1 capsule by mouth daily.    [provider]  ?nitroGLYCERIN (NITROSTAT) 0.4 MG SL tablet PLACE 1 TABLET UNDER THE TONGUE AT THE FIRST SIGN OF ATTACK, MAY REPEAT EVERY 5 MINUTES FOR 3 DOSES IN 15 MINUTES. IF PAIN PERSISTS CALL 911 08/24/20   Glendale Chard, MD  ?rosuvastatin (CRESTOR) 5 MG tablet TAKE 1 TABLET BY MOUTH [place]. DO NOT TAKE ON SATURDAY AND SUNDAY 01/29/21   Glendale Chard, MD  ?traMADol (ULTRAM) 50 MG tablet Take 1 tablet (50 mg total) by mouth every 12 (twelve) hours as needed. 04/09/21   Aundra Dubin, PA-C  ? ? ?Family History ?Family History  ?Problem Relation Age of Onset  ? Cancer Mother   ? Hypertension Father   ? Cancer Brother   ? Hypertension Paternal Grandmother   ? Heart disease Brother   ? Diabetes Neg Hx   ? Coronary artery disease Neg Hx   ? ? ?Social History ?Social History  ? ?Tobacco Use  ? Smoking status: Never  ? Smokeless tobacco: Never  ?Vaping Use  ? Vaping Use: Never used  ?Substance Use Topics  ? Alcohol use: No  ? Drug use: No  ? ? ? ?Allergies   ?Patient has no known allergies. ? ? ?Review of Systems ?Review of Systems  ?Constitutional:  Negative for chills and fever.  ?Eyes:  Negative for discharge and redness.  ?Gastrointestinal:  Negative for abdominal pain, nausea and vomiting.  ?Musculoskeletal:  Positive for arthralgias, back pain and myalgias.  ?Neurological:  Negative for weakness and numbness.  ? ? ?Physical Exam ?Triage Vital Signs ?ED Triage Vitals  ?Enc Vitals Group  ?   BP   ?   Pulse   ?   Resp   ?   Temp   ?   Temp src   ?   SpO2   ?   Weight   ?    Height   ?   Head Circumference   ?   Peak Flow   ?   Pain Score   ?   Pain Loc   ?   Pain Edu?   ?   Excl. in [place]?   ? ?No data found. ? ?Updated Vital Signs ?BP (!) 169/72 (BP Location: Left Arm)   Pulse 92   Temp 97.8 ?F (36.6 ?C) (Oral)   Resp 16   SpO2 100%  ?   ? ?Physical Exam ?Vitals and nursing note reviewed.  ?Constitutional:   ?   General:  She is not in acute distress. ?   Appearance: Normal appearance. She is not ill-appearing.  ?HENT:  ?   Head: Normocephalic and atraumatic.  ?Eyes:  ?   Conjunctiva/sclera: Conjunctivae normal.  ?Cardiovascular:  ?   Rate and Rhythm: Normal rate.  ?Pulmonary:  ?   Effort: Pulmonary effort is normal.  ?Musculoskeletal:  ?   Comments: Full ROM of right knee, Ambulates with rolling walker, no swelling to right leg appreciated  ?Neurological:  ?   Mental Status: She is alert.  ?Psychiatric:     ?   Mood and Affect: Mood normal.     ?   Behavior: Behavior normal.     ?   Thought Content: Thought content normal.  ? ? ? ?UC Treatments / Results  ?Labs ?(all labs ordered are listed, but only abnormal results are displayed) ?Labs Reviewed - No data to display ? ?EKG ? ? ?Radiology ?No results found. ? ?Procedures ?Procedures (including critical care time) ? ?Medications Ordered in UC ?Medications - No data to display ? ?Initial Impression / Assessment and Plan / UC Course  ?I have reviewed the triage vital signs and the nursing notes. ? ?Pertinent labs & imaging results that were available during my care of the patient were reviewed by me and considered in my medical decision making (see chart for details). ? ?  ?Will trial steroids for treatment of suspected sciatica. Encouraged follow up with any further concerns.  ? ?Final Clinical Impressions(s) / UC Diagnoses  ? ?Final diagnoses:  ?Right sided sciatica  ? ?Discharge Instructions   ?None ?  ? ?ED Prescriptions   ? ? Medication Sig Dispense Auth. Provider  ? predniSONE (DELTASONE) 20 MG tablet Take 2 tablets (40 mg total)  by mouth daily with breakfast for 5 days. 10 tablet Francene Finders, PA-C  ? ?  ? ?PDMP not reviewed this encounter. ?  ?Francene Finders, PA-C ?05/09/21 [phone] ? ?

## 2021-05-13 ENCOUNTER — Ambulatory Visit: Payer: Medicare Other

## 2021-05-13 DIAGNOSIS — I10 Essential (primary) hypertension: Secondary | ICD-10-CM

## 2021-05-13 DIAGNOSIS — E1122 Type 2 diabetes mellitus with diabetic chronic kidney disease: Secondary | ICD-10-CM

## 2021-05-13 NOTE — Patient Instructions (Signed)
Social Worker Visit Information ? ?Goals we discussed today:  ?Patient Goals/Self-Care Activities ?patient will:  ? - Follow up with primary care provider as needed to address pain ?-Contact SW as needed  ? ?Materials Provided: No: Patient declined ? ?Patient verbalizes understanding of instructions and care plan provided today and agrees to view in [place]. Active MyChart status confirmed with patient.   ? ?Follow Up Plan:  No follow up planned at this time. Please contact me as needed. ? ?Daneen Schick, BSW, CDP ?Social Worker, Certified Dementia Practitioner ?[facility] / [facility] ?[phone] ? ?   ? ?

## 2021-05-13 NOTE — Chronic Care Management (AMB) (Signed)
?Chronic Care Management  ? ? Social Work Note ? ?05/13/2021 ?Name: [name] MRN: [id number] DOB: Nov 20, 1927 ? ?[name] is a 88 y.o. year old female who is a primary care patient of Glendale Chard, MD. The CCM team was consulted to assist the patient with chronic disease management and/or care coordination needs related to:  DM II, CKD III, HTN .  ? ?Engaged with patient by telephone for follow up visit in response to provider referral for social work chronic care management and care coordination services.  ? ?Consent to Services:  ?The patient was given information about Chronic Care Management services, agreed to services, and gave verbal consent prior to initiation of services.  Please see initial visit note for detailed documentation.  ? ?Patient agreed to services and consent obtained.  ? ?Assessment: Review of patient past medical history, allergies, medications, and health status, including review of relevant consultants reports was performed today as part of a comprehensive evaluation and provision of chronic care management and care coordination services.    ? ?SDOH (Social Determinants of Health) assessments and interventions performed:   ? ?Advanced Directives Status: Not addressed in this encounter. ? ?CCM Care Plan ? ?No Known Allergies ? ?Outpatient Encounter Medications as of 05/13/2021  ?Medication Sig  ? Accu-Chek Softclix Lancets lancets Use as instructed to check blood sugars up to 3 times a day ? ?Dx code e11.65  ? acetaminophen (TYLENOL) 650 MG CR tablet Take 650 mg by mouth every 8 (eight) hours as needed for pain.  ? amLODipine (NORVASC) 5 MG tablet TAKE 1 TABLET(5 MG) BY MOUTH DAILY  ? Blood Glucose Calibration (ACCU-CHEK AVIVA) SOLN Use with accu-check aviva machine  ? Blood Glucose Monitoring Suppl (ACCU-CHEK AVIVA PLUS) w/Device KIT Use to check  Blood sugars up to 3 times a day. ? ?Dx code e11.65  ? carvedilol (COREG) 12.5 MG tablet TAKE 1 TABLET(12.5 MG) BY MOUTH TWICE DAILY  WITH A MEAL  ? chlorthalidone (HYGROTON) 25 MG tablet Take 12.5 mg by mouth daily.  ? Cholecalciferol (VITAMIN D) 50 MCG (2000 UT) CAPS Take by mouth.  ? clindamycin (CLEOCIN) 150 MG capsule For dentist appointments  ? cyclobenzaprine (FLEXERIL) 5 MG tablet Take 1 tablet (5 mg total) by mouth 2 (two) times daily between meals as needed for muscle spasms.  ? dipyridamole-aspirin (AGGRENOX) 200-25 MG 12hr capsule TAKE 1 CAPSULE BY MOUTH DAILY  ? glimepiride (AMARYL) 1 MG tablet TAKE 1/2 TABLET BY MOUTH ON MONDAYS, WEDNESDAYS, AND FRIDAYS  ? glucosamine-chondroitin 500-400 MG tablet Take 1 tablet by mouth 3 (three) times daily.  ? glucose blood test strip Use as instructed to check blood sugars up to 3 times a day. ? ?Dx code e11.65  ? irbesartan (AVAPRO) 150 MG tablet Take 1 tablet (150 mg total) by mouth daily.  ? JARDIANCE 10 MG TABS tablet Take 10 mg by mouth daily.  ? Multiple Vitamin (MULTIVITAMIN) capsule Take 1 capsule by mouth daily.  ? nitroGLYCERIN (NITROSTAT) 0.4 MG SL tablet PLACE 1 TABLET UNDER THE TONGUE AT THE FIRST SIGN OF ATTACK, MAY REPEAT EVERY 5 MINUTES FOR 3 DOSES IN 15 MINUTES. IF PAIN PERSISTS CALL 911  ? predniSONE (DELTASONE) 20 MG tablet Take 2 tablets (40 mg total) by mouth daily with breakfast for 5 days.  ? rosuvastatin (CRESTOR) 5 MG tablet TAKE 1 TABLET BY MOUTH MONDAY THROUGH FRIDAY. DO NOT TAKE ON SATURDAY AND SUNDAY  ? traMADol (ULTRAM) 50 MG tablet Take 1 tablet (50 mg total) by mouth  every 12 (twelve) hours as needed.  ? ?No facility-administered encounter medications on file as of 05/13/2021.  ? ? ?Patient Active Problem List  ? Diagnosis Date Noted  ? Peripheral arterial disease ([place]) 03/17/2021  ? Onychomycosis 02/28/2021  ? Right leg pain 07/26/2020  ? Onychodystrophy 07/26/2020  ? CKD (chronic kidney disease) stage 4, GFR 15-29 ml/min (HCC) 07/19/2020  ? Hypertensive heart and renal disease 03/22/2020  ? Gout 12/07/2017  ? Type 2 diabetes mellitus with stage 3 chronic kidney  disease, without long-term current use of insulin ([place]) 02/11/2014  ? History of coronary artery bypass graft 07/01/2010  ? MYOCARDIAL INFARCTION, INFERIOR WALL, SUBSEQUENT CARE 05/12/2008  ? CAD, NATIVE VESSEL 05/12/2008  ? HYPERLIPIDEMIA-MIXED 05/11/2008  ? Essential hypertension 05/11/2008  ? CAD, UNSPECIFIED SITE 05/11/2008  ? CEREBROVASCULAR DISEASE 05/11/2008  ? Rheumatoid arthritis ([place]) 05/11/2008  ? ? ?Conditions to be addressed/monitored: HTN, DMII, and CKD Stage III ? ?Care Plan : Social Work Plan of Care  ?Updates made by Daneen Schick since 05/13/2021 12:00 AM  ?Completed 05/13/2021  ? ?Problem: Mobility and Independence Resolved 05/13/2021  ?  ? ?Goal: Mobility and Independence Optimized Completed 05/13/2021  ?Start Date: 04/18/2021  ?Priority: High  ?Note:   ?Current Barriers:  ?Chronic disease management support and education needs related to HTN, DM, CKD Stage III, and PAD   ?Limited knowledge of how to purchase a personal emergency response system ? ?Social Worker Clinical Goal(s):  ?patient will work with SW to identify and address any acute and/or chronic care coordination needs related to the self health management of CAD, HTN, DM, CKD Stage III, and PAD   ?Work with SW to explore options to obtain a personal emergency response system ?SW Interventions:  ?Inter-disciplinary care team collaboration (see longitudinal plan of care) ?Collaboration with Glendale Chard, MD regarding development and update of comprehensive plan of care as evidenced by provider attestation and co-signature ?Telephonic visit completed with the patient to assess goal progression ?Confirmed the patient received mailed resource and does not need SW assistance at this time ?Discussed patient was recently seen at urgent care due to pain and is still painful ?Advised the patient SW would notify her provider of ongoing pain ?Discussed plan for patient to contact SW as needed with future care coordination needs ?Collaboration with  [name] to advise of patients ongoing pain - [name] will follow up with the patient ?Collaboration with [place] regarding interventions and plan for patient to contact SW as needed ? ?Patient Goals/Self-Care Activities ?patient will:  ? - Follow up with primary care provider as needed to address pain ?-Contact SW as needed  ? ? ?  ?  ? ?Follow Up Plan:  No SW follow up planned at this time. Patient will contact SW as needed. ?     ?Daneen Schick, BSW, CDP ?Social Worker, Certified Dementia Practitioner ?[facility] ?[phone] ? ?   ? ? ? ? ?

## 2021-05-17 DIAGNOSIS — I10 Essential (primary) hypertension: Secondary | ICD-10-CM

## 2021-05-17 DIAGNOSIS — E1122 Type 2 diabetes mellitus with diabetic chronic kidney disease: Secondary | ICD-10-CM

## 2021-05-17 DIAGNOSIS — N184 Chronic kidney disease, stage 4 (severe): Secondary | ICD-10-CM

## 2021-05-17 DIAGNOSIS — I2510 Atherosclerotic heart disease of native coronary artery without angina pectoris: Secondary | ICD-10-CM

## 2021-05-21 ENCOUNTER — Other Ambulatory Visit: Payer: Self-pay

## 2021-05-21 ENCOUNTER — Telehealth: Payer: Self-pay

## 2021-05-21 ENCOUNTER — Ambulatory Visit (INDEPENDENT_AMBULATORY_CARE_PROVIDER_SITE_OTHER): Payer: Medicare Other | Admitting: Orthopaedic Surgery

## 2021-05-21 ENCOUNTER — Ambulatory Visit (INDEPENDENT_AMBULATORY_CARE_PROVIDER_SITE_OTHER): Payer: Medicare Other

## 2021-05-21 DIAGNOSIS — I2510 Atherosclerotic heart disease of native coronary artery without angina pectoris: Secondary | ICD-10-CM

## 2021-05-21 DIAGNOSIS — M545 Low back pain, unspecified: Secondary | ICD-10-CM

## 2021-05-21 MED ORDER — PREDNISONE 10 MG (21) PO TBPK: ORAL_TABLET | ORAL | 3 refills | Status: DC

## 2021-05-21 MED ORDER — TRAMADOL HCL 50 MG PO TABS: 50.0000 mg | ORAL_TABLET | Freq: Every day | ORAL | 0 refills | Status: DC | PRN

## 2021-05-21 NOTE — Telephone Encounter (Signed)
BV pending for Monov[place], right knee.  

## 2021-05-21 NOTE — Progress Notes (Signed)
? ?Office Visit Note ?  ?Patient: [name]           ?Date of Birth: 06-Oct-1927           ?MRN: [id number] ?Visit Date: 05/21/2021 ?             ?Requested by: Glendale Chard, MD ?[address] ?[address] ?[place],  [place] [address] ?PCP: Glendale Chard, MD ? ? ?Assessment & Plan: ?Visit Diagnoses:  ?1. Low back pain, unspecified back pain laterality, unspecified chronicity, unspecified whether sciatica present   ? ? ?Plan: Patient returns today for ongoing right hip and buttock pain with radiation down the leg into the lateral thigh.  She has had 2 rounds of prednisone with temporary relief.  Requesting refill of prednisone and tramadol.  Denies any red flag symptoms. ? ?Examination of the lumbar spine and right lower extremity is unchanged. ? ?With ongoing right leg radiculopathy despite 2 rounds of prednisone we will need an MRI to rule out structural abnormalities as well as plan for lumbar ESI.  I will communicate with the patient through [place] regarding the MRI results once they are available. ? ?Follow-Up Instructions: No follow-ups on file.  ? ?Orders:  ?Orders Placed This Encounter  ?Procedures  ? XR Lumbar Spine 2-3 Views  ? ?Meds ordered this encounter  ?Medications  ? predniSONE (STERAPRED UNI-PAK 21 TAB) 10 MG (21) TBPK tablet  ?  Sig: Take as directed  ?  Dispense:  21 tablet  ?  Refill:  3  ? traMADol (ULTRAM) 50 MG tablet  ?  Sig: Take 1-2 tablets (50-100 mg total) by mouth daily as needed.  ?  Dispense:  20 tablet  ?  Refill:  0  ? ? ? ? Procedures: ?No procedures performed ? ? ?Clinical Data: ?No additional findings. ? ? ?Subjective: ?Chief Complaint  ?Patient presents with  ? Lower Back - Pain  ? Right Hip - Pain  ? ? ?HPI ? ?Review of Systems ? ? ?Objective: ?Vital Signs: There were no vitals taken for this visit. ? ?Physical Exam ? ?Ortho Exam ? ?Specialty Comments:  ?No specialty comments available. ? ?Imaging: ?No results found. ? ? ?PMFS History: ?Patient Active Problem List  ?  Diagnosis Date Noted  ? Peripheral arterial disease ([place]) 03/17/2021  ? Onychomycosis 02/28/2021  ? Right leg pain 07/26/2020  ? Onychodystrophy 07/26/2020  ? CKD (chronic kidney disease) stage 4, GFR 15-29 ml/min (HCC) 07/19/2020  ? Hypertensive heart and renal disease 03/22/2020  ? Gout 12/07/2017  ? Type 2 diabetes mellitus with stage 3 chronic kidney disease, without long-term current use of insulin ([place]) 02/11/2014  ? History of coronary artery bypass graft 07/01/2010  ? MYOCARDIAL INFARCTION, INFERIOR WALL, SUBSEQUENT CARE 05/12/2008  ? CAD, NATIVE VESSEL 05/12/2008  ? HYPERLIPIDEMIA-MIXED 05/11/2008  ? Essential hypertension 05/11/2008  ? CAD, UNSPECIFIED SITE 05/11/2008  ? CEREBROVASCULAR DISEASE 05/11/2008  ? Rheumatoid arthritis ([place]) 05/11/2008  ? ?Past Medical History:  ?Diagnosis Date  ? Cerebrovascular disease, unspecified   ? Coronary atherosclerosis of unspecified type of vessel, native or graft   ? Diabetes mellitus without complication ([place])   ? Other and unspecified hyperlipidemia   ? Rheumatoid arthritis(714.0)   ? Shingles 2003  ? Unspecified essential hypertension   ?  ?Family History  ?Problem Relation Age of Onset  ? Cancer Mother   ? Hypertension Father   ? Cancer Brother   ? Hypertension Paternal Grandmother   ? Heart disease Brother   ? Diabetes  Neg Hx   ? Coronary artery disease Neg Hx   ?  ?Past Surgical History:  ?Procedure Laterality Date  ? Cardiac Bypass  2003  ? CATARACT EXTRACTION, BILATERAL  2007  ? ?Social History  ? ?Occupational History  ? Occupation: retired  ?Tobacco Use  ? Smoking status: Never  ? Smokeless tobacco: Never  ?Vaping Use  ? Vaping Use: Never used  ?Substance and Sexual Activity  ? Alcohol use: No  ? Drug use: No  ? Sexual activity: Not Currently  ? ? ? ? ? ? ?

## 2021-05-28 ENCOUNTER — Telehealth: Payer: Self-pay

## 2021-05-28 NOTE — Telephone Encounter (Signed)
Faxed completed PA form to [facility] at [phone] for SynviscOne, right knee. ?PA pending ?

## 2021-05-30 ENCOUNTER — Encounter: Payer: Self-pay | Admitting: Sports Medicine

## 2021-05-30 ENCOUNTER — Ambulatory Visit: Payer: Medicare Other

## 2021-05-30 ENCOUNTER — Ambulatory Visit (INDEPENDENT_AMBULATORY_CARE_PROVIDER_SITE_OTHER): Payer: BC Managed Care – PPO | Admitting: Sports Medicine

## 2021-05-30 DIAGNOSIS — M79609 Pain in unspecified limb: Secondary | ICD-10-CM | POA: Diagnosis not present

## 2021-05-30 DIAGNOSIS — B351 Tinea unguium: Secondary | ICD-10-CM

## 2021-05-30 DIAGNOSIS — N183 Chronic kidney disease, stage 3 unspecified: Secondary | ICD-10-CM

## 2021-05-30 DIAGNOSIS — Z8673 Personal history of transient ischemic attack (TIA), and cerebral infarction without residual deficits: Secondary | ICD-10-CM

## 2021-05-30 DIAGNOSIS — M79604 Pain in right leg: Secondary | ICD-10-CM

## 2021-05-30 DIAGNOSIS — E1122 Type 2 diabetes mellitus with diabetic chronic kidney disease: Secondary | ICD-10-CM

## 2021-05-30 DIAGNOSIS — R531 Weakness: Secondary | ICD-10-CM

## 2021-05-30 NOTE — Progress Notes (Signed)
SITUATION ?Patient Name:  [name] ?MRN:   [id number] ?Reason for Visit: Evaluation for [facility] AFO ? ?Patient Report: ?Chief Complaint:   Right side weakness secondary to stroke ?Nature of Discomfort/Pain:  Ambulatory Standing Resting ?Location:    right lower extremity ?Onset & Duration:   Sudden and Present longer than 3 months ?Course:    gradually worsening ?Aggravating or Alleviating Factors: Ambulation ? ?OBJECTIVE DATA & MEASUREMENTS ?Prognosis:    Good ?Duration of use:   5 years ? ?Diagnosis: ?  ICD-10-CM   ?1. Right sided weakness  R53.1   ?  ?2. History of stroke  Z86.73   ?  ?3. Right leg pain  M79.604   ?  ? ? ?GOALS, NECESSITIES, & JUSTIFICATIONS ?Recommended Device: [employer] ?Color:    Black ?Closure:   Velcro ? ?Laterality HCPCS Code Description Justification  ?right [phone] Plastic orthosis, custom molded from a model of the patient, custom fabricated, includes casting and cast preparation. Necessary to provide triplanar support to the foot/ankle complex  ?right L2275 Varus / valgus modification Necessary for medolateral control  ?right L2330 Addition to lower extremity, lacer molded to patient model Necessary to ensure secure hold of orthosis to patient's limb  ?right L2820 Addition to lower extremity orthosis, soft interface for molded plastic below knee section Necessary to relieve pressure on bony prominences  ? ? ?I certify that [name] qualifies for and will benefit from an ankle foot orthosis used during ambulation based on meeting all of the following criteria;  ? ?The patient is: ?- Ambulatory, and ?- Has weakness or deformity of the foot and ankle, and ?- Requires stabilization for medical reasons, and ?- Has the potential to benefit functionally ? ?The patient?s medical record contains sufficient documentation of the patients medical condition to substantiate the necessity for the type and quantity of the items ordered. ? ?The goals of this therapy: ?-  Improve Mobility ?- Improve Lower Extremity Stability ?- Decrease Pain ?- Facilitate Soft Tissue Healing ?- Facilitate Immobilization, healing and treatment of an injury ? ?Necessity of Ankle Foot Orthotic molded to patient model: ?A custom (vs. prefabricated) ankle foot orthosis has been prescribed based on the following criteria which are specific to the condition of this patient; ?- The patient could not be fit with a prefabricated AFO ?- The condition necessitating the orthosis is expected to be permanent or of longstanding duration (more than 6 months) ?- There is need to control the ankle or foot in more than one plane ?- The patient has a documented neurological, circulatory, or orthopedic condition that requires custom fabrication over a model to prevent tissue injury ?- The patient has a healing fracture that lacks normal anatomical integrity or anthropometric proportions ? ?I hereby certify that the ankle foot orthotic described above is a rigid or semi-rigid device which is used for the purpose of supporting a weak or deformed body member or restricting or eliminating motion in a diseased or injured part of the body. It is designed to provide support and counterforce on the limb or body part that is being braced. In my opinion, the custom molded ankle foot orthosis is both reasonable and necessary in reference to accepted standards of medical practice in the treatment  ?of the patient condition and rehabilitation. ? ?ACTIONS PERFORMED ?Patient was evaluated and casted for [place] AFO via [employer]. Procedure was explained to patient and family. Patient tolerated procedure. patient and family selected device color and closure method.  ? ?PLAN ?  Patient to return in four to six weeks for fitting and delivery of device. Plan of care was explained to and agreed upon by patient and family. All questions were answered and concerns addressed. ? ? ? ? ? ?

## 2021-05-30 NOTE — Progress Notes (Signed)
Subjective: ?[name] is a 88 y.o. female patient with history of diabetes who presents to office today complaining of long,mildly painful nails  while ambulating in shoes; unable to trim.  History of stroke in 2003 as previously noted. States that her right side feel weak and that she was measured for a brace this visit. ?Fasting blood sugar 132 ?Last PCP visit with [name] was February 2023 ?A1c per patient unknown ? ? ?Patient Active Problem List  ? Diagnosis Date Noted  ? Peripheral arterial disease ([place]) 03/17/2021  ? Onychomycosis 02/28/2021  ? Right leg pain 07/26/2020  ? Onychodystrophy 07/26/2020  ? CKD (chronic kidney disease) stage 4, GFR 15-29 ml/min (HCC) 07/19/2020  ? Hypertensive heart and renal disease 03/22/2020  ? Gout 12/07/2017  ? Type 2 diabetes mellitus with stage 3 chronic kidney disease, without long-term current use of insulin ([place]) 02/11/2014  ? History of coronary artery bypass graft 07/01/2010  ? MYOCARDIAL INFARCTION, INFERIOR WALL, SUBSEQUENT CARE 05/12/2008  ? CAD, NATIVE VESSEL 05/12/2008  ? HYPERLIPIDEMIA-MIXED 05/11/2008  ? Essential hypertension 05/11/2008  ? CAD, UNSPECIFIED SITE 05/11/2008  ? CEREBROVASCULAR DISEASE 05/11/2008  ? Rheumatoid arthritis ([place]) 05/11/2008  ? ?Current Outpatient Medications on File Prior to Visit  ?Medication Sig Dispense Refill  ? Accu-Chek Softclix Lancets lancets Use as instructed to check blood sugars up to 3 times a day ? ?Dx code e11.65 100 each 12  ? acetaminophen (TYLENOL) 650 MG CR tablet Take 650 mg by mouth every 8 (eight) hours as needed for pain.    ? amLODipine (NORVASC) 5 MG tablet TAKE 1 TABLET(5 MG) BY MOUTH DAILY 90 tablet 2  ? Blood Glucose Calibration (ACCU-CHEK AVIVA) SOLN Use with accu-check aviva machine 1 each 3  ? Blood Glucose Monitoring Suppl (ACCU-CHEK AVIVA PLUS) w/Device KIT Use to check  Blood sugars up to 3 times a day. ? ?Dx code e11.65 1 kit 3  ? carvedilol (COREG) 12.5 MG tablet TAKE 1 TABLET(12.5 MG) BY  MOUTH TWICE DAILY WITH A MEAL 180 tablet 1  ? chlorthalidone (HYGROTON) 25 MG tablet Take 12.5 mg by mouth daily.    ? Cholecalciferol (VITAMIN D) 50 MCG (2000 UT) CAPS Take by mouth.    ? clindamycin (CLEOCIN) 150 MG capsule For dentist appointments    ? cyclobenzaprine (FLEXERIL) 5 MG tablet Take 1 tablet (5 mg total) by mouth 2 (two) times daily between meals as needed for muscle spasms. 20 tablet 0  ? dipyridamole-aspirin (AGGRENOX) 200-25 MG 12hr capsule TAKE 1 CAPSULE BY MOUTH DAILY 180 capsule 1  ? glimepiride (AMARYL) 1 MG tablet TAKE 1/2 TABLET BY MOUTH ON MONDAYS, WEDNESDAYS, AND FRIDAYS 36 tablet 1  ? glucosamine-chondroitin 500-400 MG tablet Take 1 tablet by mouth 3 (three) times daily.    ? glucose blood test strip Use as instructed to check blood sugars up to 3 times a day. ? ?Dx code e11.65 100 each 12  ? irbesartan (AVAPRO) 150 MG tablet Take 1 tablet (150 mg total) by mouth daily. 90 tablet 2  ? JARDIANCE 10 MG TABS tablet Take 10 mg by mouth daily.    ? Multiple Vitamin (MULTIVITAMIN) capsule Take 1 capsule by mouth daily.    ? nitroGLYCERIN (NITROSTAT) 0.4 MG SL tablet PLACE 1 TABLET UNDER THE TONGUE AT THE FIRST SIGN OF ATTACK, MAY REPEAT EVERY 5 MINUTES FOR 3 DOSES IN 15 MINUTES. IF PAIN PERSISTS CALL 911 50 tablet 0  ? predniSONE (STERAPRED UNI-PAK 21 TAB) 10 MG (21) TBPK tablet  Take as directed 21 tablet 3  ? rosuvastatin (CRESTOR) 5 MG tablet TAKE 1 TABLET BY MOUTH MONDAY THROUGH FRIDAY. DO NOT TAKE ON SATURDAY AND SUNDAY 90 tablet 1  ? traMADol (ULTRAM) 50 MG tablet Take 1 tablet (50 mg total) by mouth every 12 (twelve) hours as needed. 30 tablet 2  ? traMADol (ULTRAM) 50 MG tablet Take 1-2 tablets (50-100 mg total) by mouth daily as needed. 20 tablet 0  ? ?No current facility-administered medications on file prior to visit.  ? ?No Known Allergies ? ?Recent Results (from the past 2160 hour(s))  ?CMP14+EGFR     Status: Abnormal  ? Collection Time: 03/14/21  3:30 PM  ?Result Value Ref Range  ?  Glucose 153 (H) 70 - 99 mg/dL  ? BUN 48 (H) 10 - 36 mg/dL  ? Creatinine, Ser 2.02 (H) 0.57 - 1.00 mg/dL  ? eGFR 23 (L) >59 mL/min/1.73  ? BUN/Creatinine Ratio 24 12 - 28  ? Sodium 141 134 - 144 mmol/L  ? Potassium 4.6 3.5 - 5.2 mmol/L  ? Chloride 102 96 - 106 mmol/L  ? CO2 24 20 - 29 mmol/L  ? Calcium 9.7 8.7 - 10.3 mg/dL  ? Total Protein 7.0 6.0 - 8.5 g/dL  ? Albumin 4.4 3.5 - 4.6 g/dL  ? Globulin, Total 2.6 1.5 - 4.5 g/dL  ? Albumin/Globulin Ratio 1.7 1.2 - 2.2  ? Bilirubin Total <0.2 0.0 - 1.2 mg/dL  ? Alkaline Phosphatase 105 44 - 121 IU/L  ? AST 19 0 - 40 IU/L  ? ALT 12 0 - 32 IU/L  ?Hemoglobin A1c     Status: Abnormal  ? Collection Time: 03/14/21  3:30 PM  ?Result Value Ref Range  ? Hgb A1c MFr Bld 6.8 (H) 4.8 - 5.6 %  ?  Comment:          Prediabetes: 5.7 - 6.4 ?         Diabetes: >6.4 ?         Glycemic control for adults with diabetes: <7.0 ?  ? Est. average glucose Bld gHb Est-mCnc 148 mg/dL  ?HM MAMMOGRAPHY     Status: None  ? Collection Time: 04/04/21 12:00 AM  ?Result Value Ref Range  ? HM Mammogram 0-4 Bi-Rad 0-4 Bi-Rad, Self Reported Normal  ?Resp Panel by RT-PCR (Flu A&B, Covid) Nasopharyngeal Swab     Status: None  ? Collection Time: 04/05/21 10:42 AM  ? Specimen: Nasopharyngeal Swab; Nasopharyngeal(NP) swabs in vial transport medium  ?Result Value Ref Range  ? SARS Coronavirus 2 by RT PCR NEGATIVE NEGATIVE  ?  Comment: (NOTE) ?SARS-CoV-2 target nucleic acids are NOT DETECTED. ? ?The SARS-CoV-2 RNA is generally detectable in upper respiratory ?specimens during the acute phase of infection. The lowest ?concentration of SARS-CoV-2 viral copies this assay can detect is ?138 copies/mL. A negative result does not preclude SARS-Cov-2 ?infection and should not be used as the sole basis for treatment or ?other patient management decisions. A negative result may occur with  ?improper specimen collection/handling, submission of specimen other ?than nasopharyngeal swab, presence of viral mutation(s) within  the ?areas targeted by this assay, and inadequate number of viral ?copies(<138 copies/mL). A negative result must be combined with ?clinical observations, patient history, and epidemiological ?information. The expected result is Negative. ? ?Fact Sheet for Patients:  ?EntrepreneurPulse.com.au ? ?Fact Sheet for Healthcare Providers:  ?IncredibleEmployment.be ? ?This test is no t yet approved or cleared by the [place] and  ?has been authorized  for detection and/or diagnosis of SARS-CoV-2 by ?FDA under an Emergency Use Authorization (EUA). This EUA will remain  ?in effect (meaning this test can be used) for the duration of the ?COVID-19 declaration under Section 564(b)(1) of the Act, 21 ?U.S.C.section 360bbb-3(b)(1), unless the authorization is terminated  ?or revoked sooner.  ? ? ?  ? Influenza A by PCR NEGATIVE NEGATIVE  ? Influenza B by PCR NEGATIVE NEGATIVE  ?  Comment: (NOTE) ?The Xpert Xpress SARS-CoV-2/FLU/RSV plus assay is intended as an aid ?in the diagnosis of influenza from Nasopharyngeal swab specimens and ?should not be used as a sole basis for treatment. Nasal washings and ?aspirates are unacceptable for Xpert Xpress SARS-CoV-2/FLU/RSV ?testing. ? ?Fact Sheet for Patients: ?EntrepreneurPulse.com.au ? ?Fact Sheet for Healthcare Providers: ?IncredibleEmployment.be ? ?This test is not yet approved or cleared by the [place] FDA and ?has been authorized for detection and/or diagnosis of SARS-CoV-2 by ?FDA under an Emergency Use Authorization (EUA). This EUA will remain ?in effect (meaning this test can be used) for the duration of the ?COVID-19 declaration under Section 564(b)(1) of the Act, 21 U.S.C. ?section 360bbb-3(b)(1), unless the authorization is terminated or ?revoked. ? ?Performed at [facility], [place] [address], [place], [place] ?[address] ?  ?Comprehensive metabolic panel     Status: Abnormal  ? Collection  Time: 04/05/21 10:45 AM  ?Result Value Ref Range  ? Sodium 139 135 - 145 mmol/L  ? Potassium 4.5 3.5 - 5.1 mmol/L  ? Chloride 105 98 - 111 mmol/L  ? CO2 24 22 - 32 mmol/L  ? Glucose, Bld 167 (H) 70 - 99 mg

## 2021-05-31 ENCOUNTER — Telehealth: Payer: Self-pay

## 2021-05-31 NOTE — Telephone Encounter (Signed)
Talked with patient and advised her that she was approved for gel injection.  Patient will hold off on gel injection until visit with Dr. Erlinda Hong. ? ?Approved for SynviscOne, right knee. ?[place] ?Medicare deductible has been met ?Covered at 100% through secondary insurance BCBS after co-pay ?Co-pay of $94.00 ?PA Approval# [id number] ?Valid 05/30/2021- 10/0/2023 ?

## 2021-06-03 ENCOUNTER — Other Ambulatory Visit: Payer: Self-pay

## 2021-06-03 ENCOUNTER — Ambulatory Visit (INDEPENDENT_AMBULATORY_CARE_PROVIDER_SITE_OTHER): Payer: Medicare Other

## 2021-06-03 ENCOUNTER — Telehealth: Payer: Medicare Other

## 2021-06-03 DIAGNOSIS — I2510 Atherosclerotic heart disease of native coronary artery without angina pectoris: Secondary | ICD-10-CM

## 2021-06-03 DIAGNOSIS — M1711 Unilateral primary osteoarthritis, right knee: Secondary | ICD-10-CM

## 2021-06-03 DIAGNOSIS — I739 Peripheral vascular disease, unspecified: Secondary | ICD-10-CM

## 2021-06-03 DIAGNOSIS — E1122 Type 2 diabetes mellitus with diabetic chronic kidney disease: Secondary | ICD-10-CM

## 2021-06-03 DIAGNOSIS — M069 Rheumatoid arthritis, unspecified: Secondary | ICD-10-CM

## 2021-06-03 DIAGNOSIS — I10 Essential (primary) hypertension: Secondary | ICD-10-CM

## 2021-06-03 NOTE — Patient Instructions (Signed)
Visit Information ? ?Thank you for taking time to visit with me today. Please don't hesitate to contact me if I can be of assistance to you before our next scheduled telephone appointment. ? ?Following are the goals we discussed today:  ?(Copy and paste patient goals from clinical care plan here) ? ?Our next appointment is by telephone on 06/18/21 at 1:45 PM  ? ?Please call the care guide team at [phone] if you need to cancel or reschedule your appointment.  ? ?If you are experiencing a Mental Health or [place] or need someone to talk to, please call [phone]-TALK (toll free, 24 hour hotline)  ? ?Patient verbalizes understanding of instructions and care plan provided today and agrees to view in [place]. Active MyChart status confirmed with patient.   ? ?Barb Merino, RN, BSN, CCM ?Care Management Coordinator ?[facility]  ?Direct Phone: [phone] ? ? ?

## 2021-06-03 NOTE — Chronic Care Management (AMB) (Signed)
?Chronic Care Management  ? ?CCM RN Visit Note ? ?06/03/2021 ?Name: [name] MRN: [id number] DOB: [birth year] ? ?Subjective: ?[name] is a 88 y.o. year old female who is a primary care patient of [place] Chard, MD. The care management team was consulted for assistance with disease management and care coordination needs.   ? ?Engaged with patient by telephone for follow up visit in response to provider referral for case management and/or care coordination services.  ? ?Consent to Services:  ?The patient was given information about Chronic Care Management services, agreed to services, and gave verbal consent prior to initiation of services.  Please see initial visit note for detailed documentation.  ? ?Patient agreed to services and verbal consent obtained.  ? ?Assessment: Review of patient past medical history, allergies, medications, health status, including review of consultants reports, laboratory and other test data, was performed as part of comprehensive evaluation and provision of chronic care management services.  ? ?SDOH (Social Determinants of Health) assessments and interventions performed:  Yes, no acute challenges ? ?CCM Care Plan ? ?No Known Allergies ? ?Outpatient Encounter Medications as of 06/03/2021  ?Medication Sig  ? Accu-Chek Softclix Lancets lancets Use as instructed to check blood sugars up to 3 times a day ? ?Dx code e11.65  ? acetaminophen (TYLENOL) 650 MG CR tablet Take 650 mg by mouth every 8 (eight) hours as needed for pain.  ? amLODipine (NORVASC) 5 MG tablet TAKE 1 TABLET(5 MG) BY MOUTH DAILY  ? Blood Glucose Calibration (ACCU-CHEK AVIVA) SOLN Use with accu-check aviva machine  ? Blood Glucose Monitoring Suppl (ACCU-CHEK AVIVA PLUS) w/Device KIT Use to check  Blood sugars up to 3 times a day. ? ?Dx code e11.65  ? carvedilol (COREG) 12.5 MG tablet TAKE 1 TABLET(12.5 MG) BY MOUTH TWICE DAILY WITH A MEAL  ? chlorthalidone (HYGROTON) 25 MG tablet Take 12.5 mg by mouth daily.  ?  Cholecalciferol (VITAMIN D) 50 MCG (2000 UT) CAPS Take by mouth.  ? clindamycin (CLEOCIN) 150 MG capsule For dentist appointments  ? cyclobenzaprine (FLEXERIL) 5 MG tablet Take 1 tablet (5 mg total) by mouth 2 (two) times daily between meals as needed for muscle spasms.  ? dipyridamole-aspirin (AGGRENOX) 200-25 MG 12hr capsule TAKE 1 CAPSULE BY MOUTH DAILY  ? glimepiride (AMARYL) 1 MG tablet TAKE 1/2 TABLET BY MOUTH ON MONDAYS, WEDNESDAYS, AND FRIDAYS  ? glucosamine-chondroitin 500-400 MG tablet Take 1 tablet by mouth 3 (three) times daily.  ? glucose blood test strip Use as instructed to check blood sugars up to 3 times a day. ? ?Dx code e11.65  ? irbesartan (AVAPRO) 150 MG tablet Take 1 tablet (150 mg total) by mouth daily.  ? JARDIANCE 10 MG TABS tablet Take 10 mg by mouth daily.  ? Multiple Vitamin (MULTIVITAMIN) capsule Take 1 capsule by mouth daily.  ? nitroGLYCERIN (NITROSTAT) 0.4 MG SL tablet PLACE 1 TABLET UNDER THE TONGUE AT THE FIRST SIGN OF ATTACK, MAY REPEAT EVERY 5 MINUTES FOR 3 DOSES IN 15 MINUTES. IF PAIN PERSISTS CALL 911  ? predniSONE (STERAPRED UNI-PAK 21 TAB) 10 MG (21) TBPK tablet Take as directed  ? rosuvastatin (CRESTOR) 5 MG tablet TAKE 1 TABLET BY MOUTH MONDAY THROUGH FRIDAY. DO NOT TAKE ON SATURDAY AND SUNDAY  ? traMADol (ULTRAM) 50 MG tablet Take 1 tablet (50 mg total) by mouth every 12 (twelve) hours as needed.  ? traMADol (ULTRAM) 50 MG tablet Take 1-2 tablets (50-100 mg total) by mouth daily as needed.  ? ?No  facility-administered encounter medications on file as of 06/03/2021.  ? ? ?Patient Active Problem List  ? Diagnosis Date Noted  ? Peripheral arterial disease ([place]) 03/17/2021  ? Onychomycosis 02/28/2021  ? Right leg pain 07/26/2020  ? Onychodystrophy 07/26/2020  ? CKD (chronic kidney disease) stage 4, GFR 15-29 ml/min (HCC) 07/19/2020  ? Hypertensive heart and renal disease 03/22/2020  ? Gout 12/07/2017  ? Type 2 diabetes mellitus with stage 3 chronic kidney disease, without  long-term current use of insulin ([place]) 02/11/2014  ? History of coronary artery bypass graft 07/01/2010  ? MYOCARDIAL INFARCTION, INFERIOR WALL, SUBSEQUENT CARE 05/12/2008  ? CAD, NATIVE VESSEL 05/12/2008  ? HYPERLIPIDEMIA-MIXED 05/11/2008  ? Essential hypertension 05/11/2008  ? CAD, UNSPECIFIED SITE 05/11/2008  ? CEREBROVASCULAR DISEASE 05/11/2008  ? Rheumatoid arthritis ([place]) 05/11/2008  ? ? ?Conditions to be addressed/monitored: DM related to type 2 with stage 3 CKD, Rheumatoid Arthritis, HTN, PAD, CAD  ? ?Care Plan : RN Care Manager Plan of Care  ?Updates made by Lynne Logan, RN since 06/03/2021 12:00 AM  ?  ? ?Problem: No plan of care established for management of chronic disease states (DM related to type 2 with stage 3 CKD, Rheumatoid Arthritis, HTN, PAD, CAD)   ?Priority: High  ?  ? ?Long-Range Goal: Development of plan of care for chronic disease management for DM related to type 2 with stage 3 CKD, Rheumatoid Arthritis, HTN, PAD, CAD   ?Start Date: 01/21/2021  ?Expected End Date: 01/21/2022  ?Recent Progress: On track  ?Priority: High  ?Note:   ?Current Barriers:  ?Knowledge Deficits related to plan of care for management of DM related to type 2 with stage 3 CKD, Rheumatoid Arthritis, HTN, PAD, CAD   ?Chronic Disease Management support and education needs related to DM related to type 2 with stage 3 CKD, Rheumatoid Arthritis, HTN, PAD, CAD   ? ?RNCM Clinical Goal(s):  ?Patient will verbalize basic understanding of  HTN and DMII disease process and self health management plan as evidenced by DM related to type 2 with stage 3 CKD, Rheumatoid Arthritis, HTN, PAD, CAD  ?take all medications exactly as prescribed and will call provider for medication related questions as evidenced by patient will report having no missed doses of her prescribed medications  ?demonstrate Ongoing health management independence as evidenced by will continue to maintain the ability to perform Self Care and Self Health management of  her chronic conditions ?continue to work with RN Care Manager to address care management and care coordination needs related to  DM related to type 2 with stage 3 CKD, Rheumatoid Arthritis, HTN, PAD, CAD  as evidenced by adherence to CM Team Scheduled appointments ?demonstrate ongoing self health care management ability   as evidenced by    through collaboration with RN Care manager, provider, and care team.  ? ?Interventions: ?1:1 collaboration with primary care provider regarding development and update of comprehensive plan of care as evidenced by provider attestation and co-signature ?Inter-disciplinary care team collaboration (see longitudinal plan of care) ?Evaluation of current treatment plan related to  self management and patient's adherence to plan as established by provider ? ? Chronic Kidney Disease Interventions:  (Status:  Condition stable.  Not addressed this visit.) Long Term Goal ?Assessed the Patient understanding of chronic kidney disease    ?Evaluation of current treatment plan related to chronic kidney disease self management and patient's adherence to plan as established by provider      ?Reviewed prescribed diet increase water to 48  oz daily ?Assessed social determinant of health barriers    ?Provided education on kidney disease progression    ?Directed patient to discuss with Nephrologist at next scheduled visit, dietary and fluid recommendations ?Educated patient while providing rationale re: signs/symptoms that may occur with stage IV chronic kidney disease and encouraged patient to notify her provider if symptoms occur ?Discussed plans with patient for ongoing care management follow up and provided patient with direct contact information for care management team ?Last practice recorded BP readings:  ?BP Readings from Last 3 Encounters:  ?03/14/21 128/60  ?03/14/21 128/60  ?11/14/20 130/62  ?Most recent eGFR/CrCl:  ?Lab Results  ?Component Value Date  ? EGFR 23 (L) 03/14/2021  ?  No  components found for: CRCL  ? ?Diabetes Interventions:  (Status:  Condition stable.  Not addressed this visit.) Long Term Goal ?Assessed patient's understanding of A1c goal: <6.5% ?Provided education to patient abou

## 2021-06-05 ENCOUNTER — Ambulatory Visit (HOSPITAL_COMMUNITY)
Admission: RE | Admit: 2021-06-05 | Discharge: 2021-06-05 | Disposition: A | Discharge status: Home / Self Care | Payer: Medicare Other | Source: Ambulatory Visit | Attending: Orthopaedic Surgery | Admitting: Orthopaedic Surgery

## 2021-06-05 DIAGNOSIS — M545 Low back pain, unspecified: Secondary | ICD-10-CM | POA: Insufficient documentation

## 2021-06-05 IMAGING — MR MR LUMBAR SPINE W/O CM
4 of 5 series · 28 of 48 positions shown · non-contrast
Comparison: Lumbar radiographs [DATE].

CLINICAL DATA: [AGE] female with low back pain. No known
injury.

EXAM:
MRI LUMBAR SPINE WITHOUT CONTRAST
TECHNIQUE: Multiplanar, multisequence MR imaging of the lumbar spine was
performed. No intravenous contrast was administered.

[Series 5: T1 · sagittal · 4.0mm · 0.81mm/px · 6 of 15 slices shown (1 of 2)]
[im 1/15]
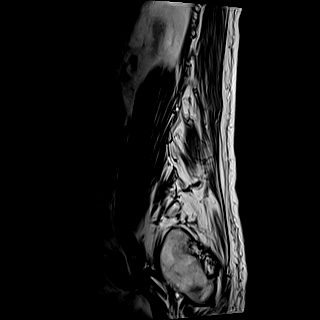
[im 3/15]
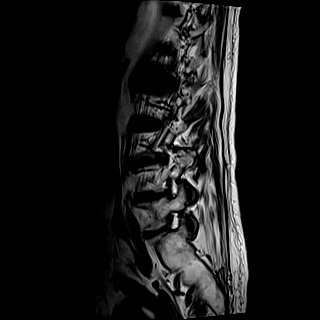
[im 6/15]
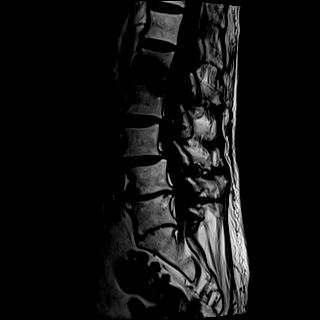
[im 9/15]
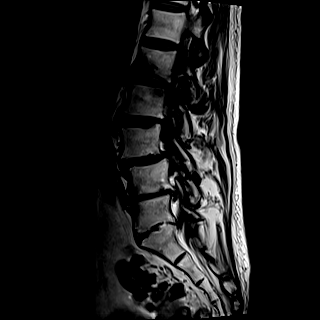
[im 12/15]
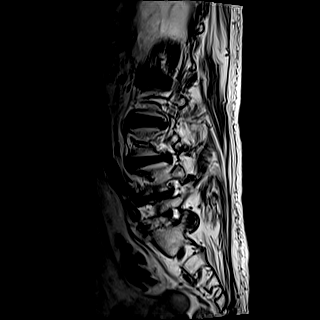
[im 15/15]
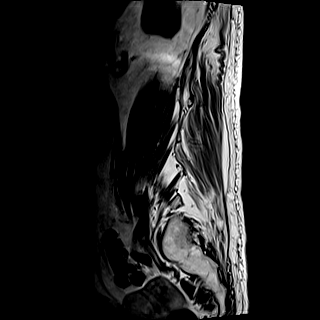

[Series 6: T2 · sagittal · 4.0mm · 0.81mm/px · 6 of 15 slices shown (1 of 2)]
[im 1/15]
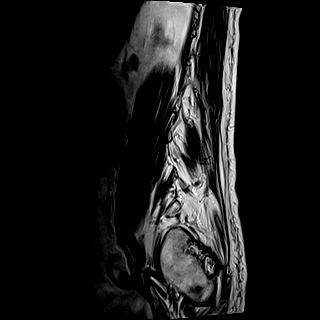
[im 3/15]
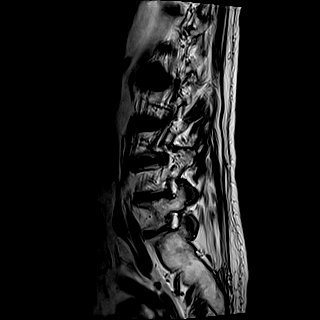
[im 6/15]
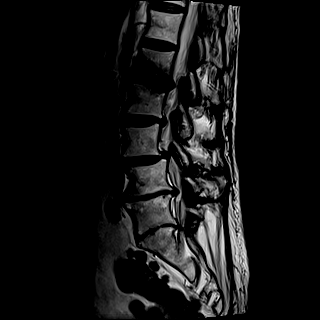
[im 9/15]
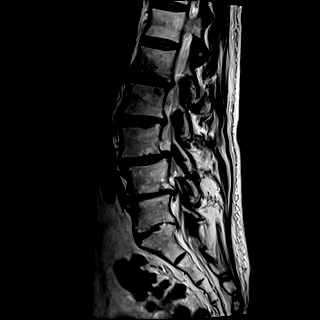
[im 12/15]
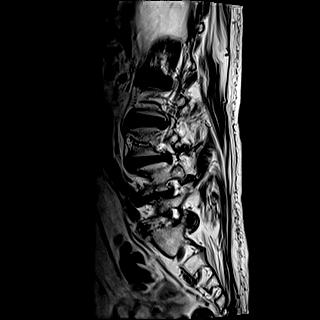
[im 15/15]
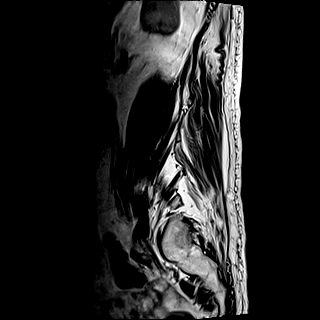

[Series 8: T2 · axial · 4.0mm · 0.62mm/px · z∈[-82,+108]mm · 9 of 39 slices shown (2 of 2)]
[im 1/39]
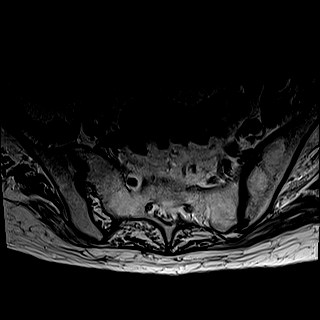
[im 6/39]
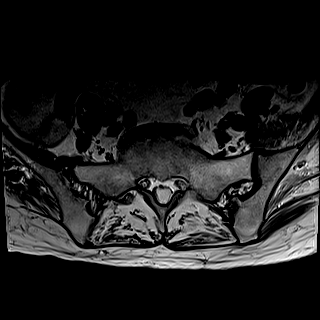
[im 11/39]
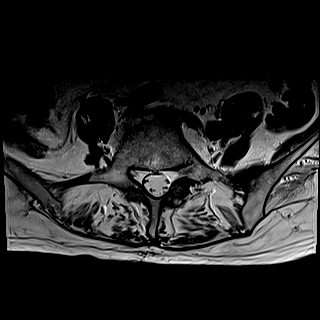
[im 17/39]
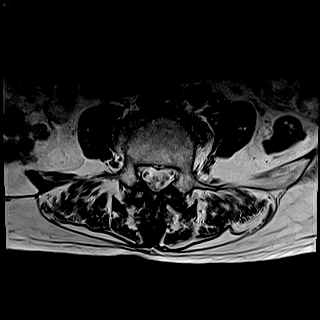
[im 20/39]
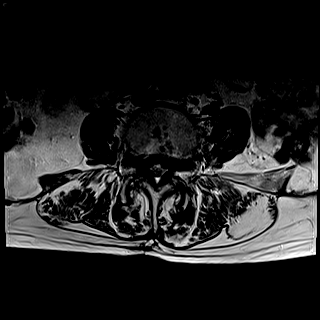
[im 22/39]
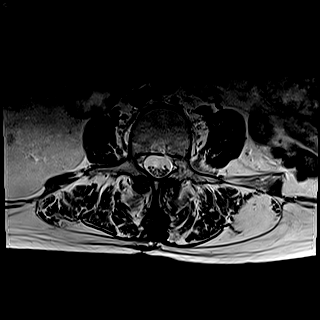
[im 28/39]
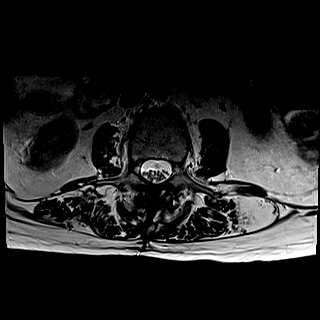
[im 33/39]
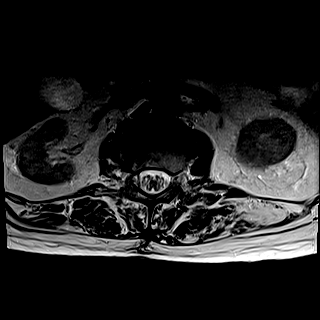
[im 39/39]
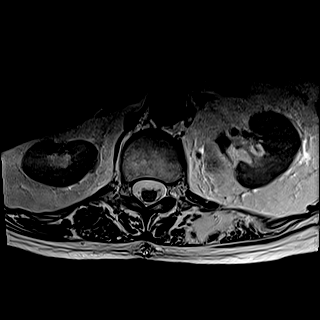

[Series 9: T1 · axial · 4.0mm · 0.39mm/px · z∈[-82,+78]mm · 7 of 39 slices shown (2 of 2)]
[im 1/39]
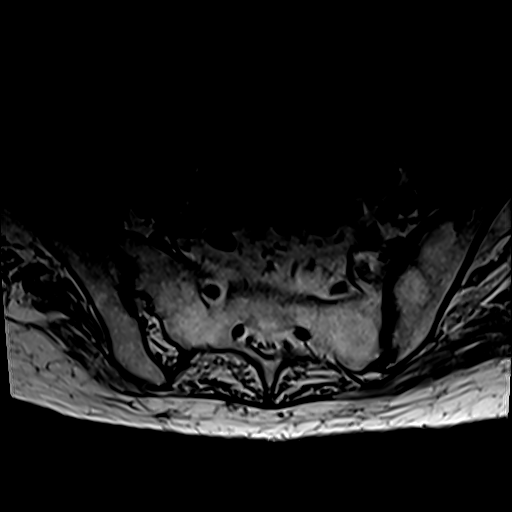
[im 6/39]
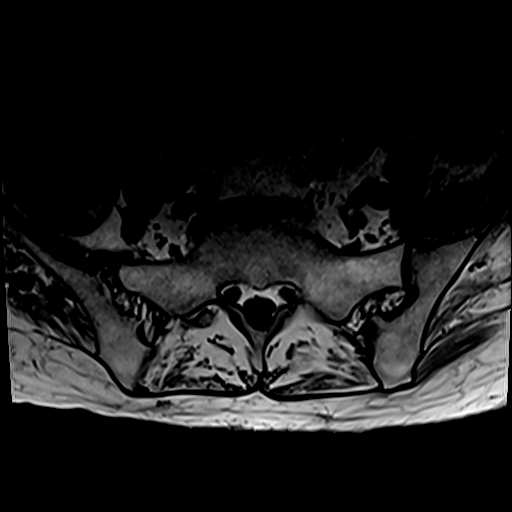
[im 11/39]
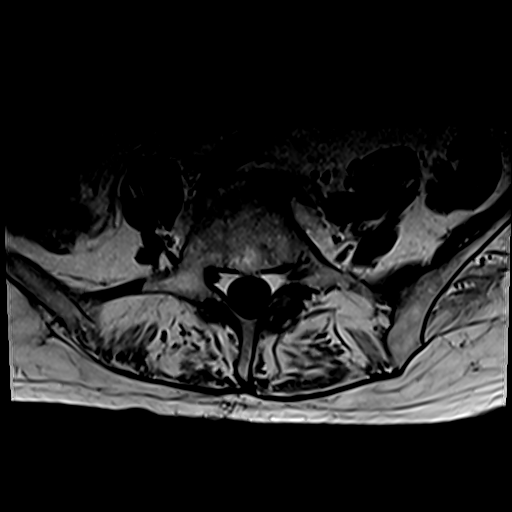
[im 17/39]
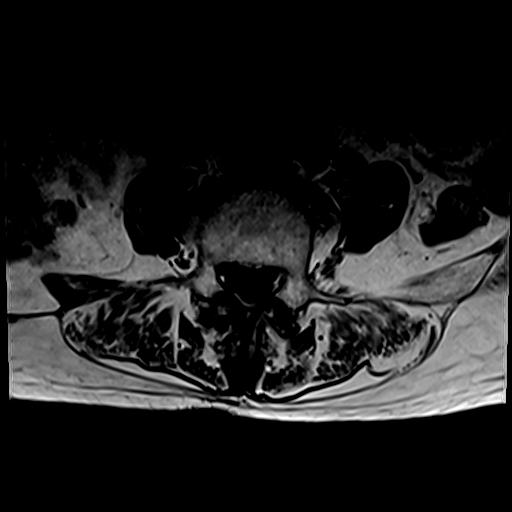
[im 20/39]
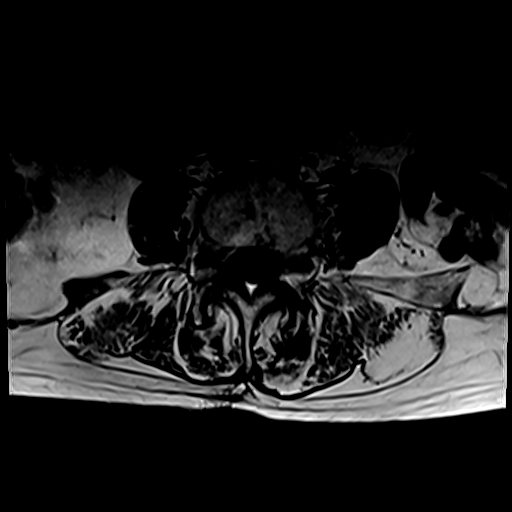
[im 22/39]
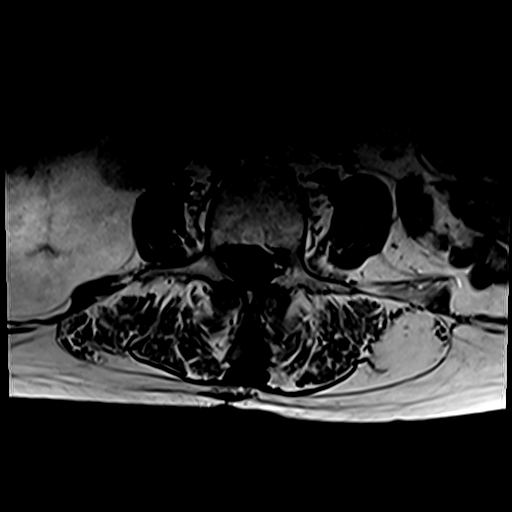
[im 33/39]
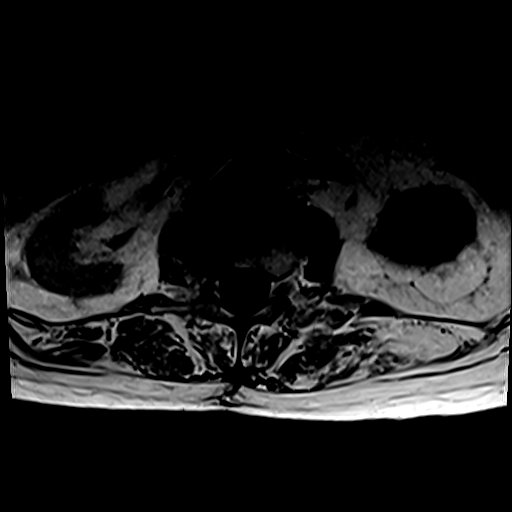

[28 of 48 positions shown; findings below may reference images not displayed]

FINDINGS: Segmentation: Assuming normal lumbar segmentation, there are
hypoplastic or absent ribs at T12. Correlation with radiographs is
recommended prior to any operative intervention.

Alignment: Mild levoconvex lumbar spine curvature. Grade 1
anterolisthesis of L3 on L4 measuring 4-5 mm. Straightening of
lumbar lordosis otherwise.

Vertebrae: Confluent marrow edema and anterior wedge compression of
the L1 vertebral body, with similar marrow edema in the L2
posterosuperior endplate eccentric to the right. And bulky rightward
disc or disc osteophyte herniation there with surrounding soft
tissue edema including the medial right psoas muscle (series 7,
image 3).

Anterior L1 vertebral loss of height up to 42%, similar to
radiographs earlier this month. No significant L2 loss of height at
this time.

No other marrow edema or acute osseous abnormality. Normal
background marrow signal. Intact sacrum.

Conus medullaris and cauda equina: Conus extends to the L1-L2 level,
see details below.

Paraspinal and other soft tissues: Diverticulosis of the visible
large bowel. Abnormal right lateral paraspinal soft tissues at L1-L2
as above. Other paraspinal soft tissues appear negative.

Disc levels:

T11-T12: Mild facet hypertrophy. Otherwise negative.

T12-L1:  Mild facet hypertrophy. Otherwise negative.

L1-L2: L1 greater than L2 endplate deformities with marrow edema and
heterogeneous intervening disc space. Possible hemorrhage within the
disc space. Circumferential disc osteophyte complex and superimposed
right far lateral disc herniation. Moderate facet and ligament
flavum hypertrophy. Additionally, bilateral posteriorly situated
synovial cysts (series 7, image 10 on the left) which should not
cause neural compromise.

Mild to moderate spinal stenosis here at the level of the conus
(series 8, image 9), but no definite conus signal abnormality. Mild
left and moderate to severe right L1 foraminal stenosis.

L2-L3: Disc desiccation and disc space loss. Circumferential disc
bulge with severe ligament flavum and moderate facet hypertrophy.
Mild spinal stenosis. Borderline to mild neural foraminal stenosis.

L3-L4: Grade 1 anterolisthesis with advanced disc space loss.
Circumferential disc osteophyte complex/pseudo disc. Severe facet
hypertrophy. Moderate to severe ligament flavum hypertrophy. Severe
spinal and bilateral lateral recess stenosis (series 8, image 21,
descending L4 nerve levels). Mild to moderate left and moderate to
severe right L3 foraminal stenosis.

L4-L5: Severe chronic disc space loss with bulky circumferential
disc osteophyte complex. Broad-based posterior component. Moderate
facet and ligament flavum hypertrophy. Only mild spinal stenosis but
moderate lateral recess stenosis (L5 nerve levels), moderate right
and moderate to severe left L4 neural foraminal stenosis.

L5-S1: Chronic severe disc space loss with evidence of some
interbody ankylosis at this level (series 6, image 8). Mild to
moderate facet hypertrophy. No spinal or convincing lateral recess
stenosis. Moderate left L5 foraminal stenosis.
IMPRESSION: 1. Transitional anatomy. Assuming normal lumbar segmentation there
appear to be absent ribs at T12.

2. Subacute compression fracture of the L1 body with anterior
wedging, up 42% anterior loss of height. No retropulsion.
But similar marrow edema in the L2 posterosuperior endplate which
might reflect impending collapse.
And heterogeneous disc with possible hemorrhage into the disc space,
large right far lateral disc herniation.
Subsequent moderate spinal stenosis at the level of the conus.
Moderate to severe right foraminal stenosis. Edema in the medial
right psoas muscle. No conus signal abnormality identified.

3. Superimposed grade 1 anterolisthesis at L3-L4 with advanced disc
and posterior element degeneration resulting in severe spinal and
bilateral lateral recess stenosis. Moderate to severe right
foraminal stenosis.

4. Mild spinal stenosis and moderate lateral recess stenosis at
L4-L5 with moderate to severe left greater than right L4 foraminal
stenosis.

5. Advanced disc degeneration but also evidence of interbody
ankylosis at L5-S1.

## 2021-06-07 ENCOUNTER — Telehealth: Payer: Self-pay | Admitting: Orthopaedic Surgery

## 2021-06-07 NOTE — Telephone Encounter (Signed)
Patient called. She would like Dr. Erlinda Hong to call her with the results of her MRI. Her call back number is [phone] ?

## 2021-06-07 NOTE — Progress Notes (Signed)
Tried calling but no answer and did not go to voicemail.  I reviewed the MRI results and there are a lot of complex findings therefore she needs a referral to Dr. Lorin Mercy or Louanne Skye.  Thanks.

## 2021-06-10 ENCOUNTER — Telehealth: Payer: Self-pay | Admitting: Orthopaedic Surgery

## 2021-06-10 ENCOUNTER — Encounter: Payer: Self-pay | Admitting: Orthopaedic Surgery

## 2021-06-10 ENCOUNTER — Other Ambulatory Visit: Payer: Self-pay

## 2021-06-10 DIAGNOSIS — M545 Low back pain, unspecified: Secondary | ICD-10-CM

## 2021-06-10 NOTE — Telephone Encounter (Signed)
Received vm call from patient, she would like to know why she is scheduled to see Dr. Lorin Mercy in follow up for MRI results. Please call [phone] ?

## 2021-06-11 ENCOUNTER — Other Ambulatory Visit: Payer: Self-pay

## 2021-06-11 ENCOUNTER — Telehealth: Payer: Self-pay

## 2021-06-11 DIAGNOSIS — E119 Type 2 diabetes mellitus without complications: Secondary | ICD-10-CM

## 2021-06-11 NOTE — Telephone Encounter (Signed)
Message was sent to patient via mychart  

## 2021-06-16 DIAGNOSIS — I10 Essential (primary) hypertension: Secondary | ICD-10-CM

## 2021-06-16 DIAGNOSIS — N184 Chronic kidney disease, stage 4 (severe): Secondary | ICD-10-CM

## 2021-06-16 DIAGNOSIS — I2510 Atherosclerotic heart disease of native coronary artery without angina pectoris: Secondary | ICD-10-CM

## 2021-06-16 DIAGNOSIS — M069 Rheumatoid arthritis, unspecified: Secondary | ICD-10-CM

## 2021-06-16 DIAGNOSIS — E1122 Type 2 diabetes mellitus with diabetic chronic kidney disease: Secondary | ICD-10-CM

## 2021-06-18 ENCOUNTER — Telehealth: Payer: Medicare Other

## 2021-06-18 ENCOUNTER — Ambulatory Visit (INDEPENDENT_AMBULATORY_CARE_PROVIDER_SITE_OTHER): Payer: Medicare Other

## 2021-06-18 DIAGNOSIS — E119 Type 2 diabetes mellitus without complications: Secondary | ICD-10-CM

## 2021-06-18 DIAGNOSIS — I739 Peripheral vascular disease, unspecified: Secondary | ICD-10-CM

## 2021-06-18 DIAGNOSIS — M069 Rheumatoid arthritis, unspecified: Secondary | ICD-10-CM

## 2021-06-18 DIAGNOSIS — I10 Essential (primary) hypertension: Secondary | ICD-10-CM

## 2021-06-18 DIAGNOSIS — I2510 Atherosclerotic heart disease of native coronary artery without angina pectoris: Secondary | ICD-10-CM

## 2021-06-18 NOTE — Telephone Encounter (Signed)
Chmg-error.  

## 2021-06-19 NOTE — Patient Instructions (Signed)
Visit Information ? ?Thank you for taking time to visit with me today. Please don't hesitate to contact me if I can be of assistance to you before our next scheduled telephone appointment. ? ?Following are the goals we discussed today:  ?(Copy and paste patient goals from clinical care plan here) ? ?Our next appointment is by telephone on 07/19/21 at 12:45 PM  ? ?Please call the care guide team at [phone] if you need to cancel or reschedule your appointment.  ? ?If you are experiencing a Mental Health or [place] or need someone to talk to, please call [phone]-TALK (toll free, 24 hour hotline)  ? ?Patient verbalizes understanding of instructions and care plan provided today and agrees to view in [place]. Active MyChart status confirmed with patient.   ? ?Barb Merino, RN, BSN, CCM ?Care Management Coordinator ?[facility] [facility]  ?Direct Phone: [phone] ? ? ?

## 2021-06-19 NOTE — Chronic Care Management (AMB) (Signed)
?Chronic Care Management  ? ?CCM RN Visit Note ? ?06/18/2021 ?Name: [name] MRN: [id number] DOB: 02-04-1928 ? ?Subjective: ?[name] is a 88 y.o. year old female who is a primary care patient of Glendale Chard, MD. The care management team was consulted for assistance with disease management and care coordination needs.   ? ?Engaged with patient by telephone for follow up visit in response to provider referral for case management and/or care coordination services.  ? ?Consent to Services:  ?The patient was given information about Chronic Care Management services, agreed to services, and gave verbal consent prior to initiation of services.  Please see initial visit note for detailed documentation.  ? ?Patient agreed to services and verbal consent obtained.  ? ?Assessment: Review of patient past medical history, allergies, medications, health status, including review of consultants reports, laboratory and other test data, was performed as part of comprehensive evaluation and provision of chronic care management services.  ? ?SDOH (Social Determinants of Health) assessments and interventions performed:  Yes, no acute needs  ? ?CCM Care Plan ? ?No Known Allergies ? ?Outpatient Encounter Medications as of 06/18/2021  ?Medication Sig  ? Accu-Chek Softclix Lancets lancets Use as instructed to check blood sugars up to 3 times a day ? ?Dx code e11.65  ? acetaminophen (TYLENOL) 650 MG CR tablet Take 650 mg by mouth every 8 (eight) hours as needed for pain.  ? amLODipine (NORVASC) 5 MG tablet TAKE 1 TABLET(5 MG) BY MOUTH DAILY  ? Blood Glucose Calibration (ACCU-CHEK AVIVA) SOLN Use with accu-check aviva machine  ? Blood Glucose Monitoring Suppl (ACCU-CHEK AVIVA PLUS) w/Device KIT Use to check  Blood sugars up to 3 times a day. ? ?Dx code e11.65  ? carvedilol (COREG) 12.5 MG tablet TAKE 1 TABLET(12.5 MG) BY MOUTH TWICE DAILY WITH A MEAL  ? chlorthalidone (HYGROTON) 25 MG tablet Take 12.5 mg by mouth daily.  ?  Cholecalciferol (VITAMIN D) 50 MCG (2000 UT) CAPS Take by mouth.  ? clindamycin (CLEOCIN) 150 MG capsule For dentist appointments  ? cyclobenzaprine (FLEXERIL) 5 MG tablet Take 1 tablet (5 mg total) by mouth 2 (two) times daily between meals as needed for muscle spasms.  ? dipyridamole-aspirin (AGGRENOX) 200-25 MG 12hr capsule TAKE 1 CAPSULE BY MOUTH DAILY  ? glimepiride (AMARYL) 1 MG tablet TAKE 1/2 TABLET BY MOUTH ON MONDAYS, WEDNESDAYS, AND FRIDAYS  ? glucosamine-chondroitin 500-400 MG tablet Take 1 tablet by mouth 3 (three) times daily.  ? glucose blood test strip Use as instructed to check blood sugars up to 3 times a day. ? ?Dx code e11.65  ? irbesartan (AVAPRO) 150 MG tablet Take 1 tablet (150 mg total) by mouth daily.  ? JARDIANCE 10 MG TABS tablet Take 10 mg by mouth daily.  ? Multiple Vitamin (MULTIVITAMIN) capsule Take 1 capsule by mouth daily.  ? nitroGLYCERIN (NITROSTAT) 0.4 MG SL tablet PLACE 1 TABLET UNDER THE TONGUE AT THE FIRST SIGN OF ATTACK, MAY REPEAT EVERY 5 MINUTES FOR 3 DOSES IN 15 MINUTES. IF PAIN PERSISTS CALL 911  ? predniSONE (STERAPRED UNI-PAK 21 TAB) 10 MG (21) TBPK tablet Take as directed  ? rosuvastatin (CRESTOR) 5 MG tablet TAKE 1 TABLET BY MOUTH MONDAY THROUGH FRIDAY. DO NOT TAKE ON SATURDAY AND SUNDAY  ? traMADol (ULTRAM) 50 MG tablet Take 1 tablet (50 mg total) by mouth every 12 (twelve) hours as needed.  ? traMADol (ULTRAM) 50 MG tablet Take 1-2 tablets (50-100 mg total) by mouth daily as needed.  ? ?  No facility-administered encounter medications on file as of 06/18/2021.  ? ? ?Patient Active Problem List  ? Diagnosis Date Noted  ? Peripheral arterial disease ([place]) 03/17/2021  ? Onychomycosis 02/28/2021  ? Right leg pain 07/26/2020  ? Onychodystrophy 07/26/2020  ? CKD (chronic kidney disease) stage 4, GFR 15-29 ml/min (HCC) 07/19/2020  ? Hypertensive heart and renal disease 03/22/2020  ? Gout 12/07/2017  ? Type 2 diabetes mellitus with stage 3 chronic kidney disease, without  long-term current use of insulin ([place]) 02/11/2014  ? History of coronary artery bypass graft 07/01/2010  ? MYOCARDIAL INFARCTION, INFERIOR WALL, SUBSEQUENT CARE 05/12/2008  ? CAD, NATIVE VESSEL 05/12/2008  ? HYPERLIPIDEMIA-MIXED 05/11/2008  ? Essential hypertension 05/11/2008  ? CAD, UNSPECIFIED SITE 05/11/2008  ? CEREBROVASCULAR DISEASE 05/11/2008  ? Rheumatoid arthritis ([place]) 05/11/2008  ? ? ?Conditions to be addressed/monitored: DM related to type 2 with stage 3 CKD, Rheumatoid Arthritis, HTN, PAD, CAD  ? ?Care Plan : RN Care Manager Plan of Care  ?Updates made by Lynne Logan, RN since 06/18/2021 12:00 AM  ?  ? ?Problem: No plan of care established for management of chronic disease states (DM related to type 2 with stage 3 CKD, Rheumatoid Arthritis, HTN, PAD, CAD)   ?Priority: High  ?  ? ?Long-Range Goal: Development of plan of care for chronic disease management for DM related to type 2 with stage 3 CKD, Rheumatoid Arthritis, HTN, PAD, CAD   ?Start Date: 01/21/2021  ?Expected End Date: 01/21/2022  ?Recent Progress: On track  ?Priority: High  ?Note:   ?Current Barriers:  ?Knowledge Deficits related to plan of care for management of DM related to type 2 with stage 3 CKD, Rheumatoid Arthritis, HTN, PAD, CAD   ?Chronic Disease Management support and education needs related to DM related to type 2 with stage 3 CKD, Rheumatoid Arthritis, HTN, PAD, CAD   ? ?RNCM Clinical Goal(s):  ?Patient will verbalize basic understanding of  HTN and DMII disease process and self health management plan as evidenced by DM related to type 2 with stage 3 CKD, Rheumatoid Arthritis, HTN, PAD, CAD  ?take all medications exactly as prescribed and will call provider for medication related questions as evidenced by patient will report having no missed doses of her prescribed medications  ?demonstrate Ongoing health management independence as evidenced by will continue to maintain the ability to perform Self Care and Self Health management of  her chronic conditions ?continue to work with RN Care Manager to address care management and care coordination needs related to  DM related to type 2 with stage 3 CKD, Rheumatoid Arthritis, HTN, PAD, CAD  as evidenced by adherence to CM Team Scheduled appointments ?demonstrate ongoing self health care management ability   as evidenced by    through collaboration with RN Care manager, provider, and care team.  ? ?Interventions: ?1:1 collaboration with primary care provider regarding development and update of comprehensive plan of care as evidenced by provider attestation and co-signature ?Inter-disciplinary care team collaboration (see longitudinal plan of care) ?Evaluation of current treatment plan related to  self management and patient's adherence to plan as established by provider ? ? Chronic Kidney Disease Interventions:  (Status:  Condition stable.  Not addressed this visit.) Long Term Goal ?Assessed the Patient understanding of chronic kidney disease    ?Evaluation of current treatment plan related to chronic kidney disease self management and patient's adherence to plan as established by provider      ?Reviewed prescribed diet increase water to  48 oz daily ?Assessed social determinant of health barriers    ?Provided education on kidney disease progression    ?Directed patient to discuss with Nephrologist at next scheduled visit, dietary and fluid recommendations ?Educated patient while providing rationale re: signs/symptoms that may occur with stage IV chronic kidney disease and encouraged patient to notify her provider if symptoms occur ?Discussed plans with patient for ongoing care management follow up and provided patient with direct contact information for care management team ?Last practice recorded BP readings:  ?BP Readings from Last 3 Encounters:  ?03/14/21 128/60  ?03/14/21 128/60  ?11/14/20 130/62  ?Most recent eGFR/CrCl:  ?Lab Results  ?Component Value Date  ? EGFR 23 (L) 03/14/2021  ?  No  components found for: CRCL  ? ?Diabetes Interventions:  (Status:  Condition stable.  Not addressed this visit.) Long Term Goal ?Assessed patient's understanding of A1c goal: <6.5% ?Provided education to patient about basic

## 2021-07-05 ENCOUNTER — Encounter: Payer: Self-pay | Admitting: Orthopaedic Surgery

## 2021-07-05 ENCOUNTER — Ambulatory Visit (INDEPENDENT_AMBULATORY_CARE_PROVIDER_SITE_OTHER): Payer: Medicare Other | Admitting: Orthopaedic Surgery

## 2021-07-05 DIAGNOSIS — I2510 Atherosclerotic heart disease of native coronary artery without angina pectoris: Secondary | ICD-10-CM

## 2021-07-05 DIAGNOSIS — S32010A Wedge compression fracture of first lumbar vertebra, initial encounter for closed fracture: Secondary | ICD-10-CM | POA: Insufficient documentation

## 2021-07-05 NOTE — Progress Notes (Signed)
Office Visit Note   Patient: [name]           Date of Birth: 02-17-1928           MRN: [id number] Visit Date: 07/05/2021              Requested by: Leandrew Koyanagi, [address],  [place] [id number] PCP: Glendale Chard, MD   Assessment & Plan: Visit Diagnoses:  1. Closed compression fracture of body of L1 vertebra (HCC)     Plan: Patient has a subacute L1 compression fracture.  Pain should get better and patient is already on vitamin D and needs to add calcium.  She has stage III kidney disease diabetes long-term use of insulin and hypertension.  No surgery is needed.  No cement augmentation is required.  Her pain should improve as the subacute fracture heals and she is already noted improvement from last month.  She can follow-up in a month.  Follow-Up Instructions: No follow-ups on file.   Orders:  No orders of the defined types were placed in this encounter.  No orders of the defined types were placed in this encounter.     Procedures: No procedures performed   Clinical Data: No additional findings.   Subjective: Chief Complaint  Patient presents with   Lower Back - Pain    HPI 88 year old female with right knee arthritis and back pain started in March.  She is seeing Dr. Sherrian Divers and had an MRI scan obtained.  MRI showed subacute compression fracture at L1 wedging 42% without retropulsion.  Patient's had discomfort with turning twisting or bending.  Pain is getting better over the last few weeks.  Review of Systems all other systems noncontributory.   Objective: Vital Signs: BP 109/64   Ht 5\' 4"  (1.626 m)   Wt 138 lb (62.6 kg)   BMI 23.69 kg/m   Physical Exam Constitutional:      Appearance: She is well-developed.  HENT:     Head: Normocephalic.     Right Ear: External ear normal.     Left Ear: External ear normal. There is no impacted cerumen.  Eyes:     Pupils: Pupils are equal, round, and reactive to light.  Neck:     Thyroid:  No thyromegaly.     Trachea: No tracheal deviation.  Cardiovascular:     Rate and Rhythm: Normal rate.  Pulmonary:     Effort: Pulmonary effort is normal.  Abdominal:     Palpations: Abdomen is soft.  Musculoskeletal:     Cervical back: No rigidity.  Skin:    General: Skin is warm and dry.  Neurological:     Mental Status: She is alert and oriented to person, place, and time.  Psychiatric:        Behavior: Behavior normal.    Ortho Exam crepitus knee range of motion quads anterior tib gastrocsoleus is intact.  No rash over exposed skin.  Specialty Comments:  No specialty comments available.  Imaging: Narrative & Impression  CLINICAL DATA:  88 year old female with low back pain. No known injury.   EXAM: MRI LUMBAR SPINE WITHOUT CONTRAST   TECHNIQUE: Multiplanar, multisequence MR imaging of the lumbar spine was performed. No intravenous contrast was administered.   COMPARISON:  Lumbar radiographs 05/21/2021.   FINDINGS: Segmentation: Assuming normal lumbar segmentation, there are hypoplastic or absent ribs at T12. Correlation with radiographs is recommended prior to any operative intervention.   Alignment: Mild levoconvex lumbar spine curvature. Grade  1 anterolisthesis of L3 on L4 measuring 4-5 mm. Straightening of lumbar lordosis otherwise.   Vertebrae: Confluent marrow edema and anterior wedge compression of the L1 vertebral body, with similar marrow edema in the L2 posterosuperior endplate eccentric to the right. And bulky rightward disc or disc osteophyte herniation there with surrounding soft tissue edema including the medial right psoas muscle (series 7, image 3).   Anterior L1 vertebral loss of height up to 42%, similar to radiographs earlier this month. No significant L2 loss of height at this time.   No other marrow edema or acute osseous abnormality. Normal background marrow signal. Intact sacrum.   Conus medullaris and cauda equina: Conus extends  to the L1-L2 level, see details below.   Paraspinal and other soft tissues: Diverticulosis of the visible large bowel. Abnormal right lateral paraspinal soft tissues at L1-L2 as above. Other paraspinal soft tissues appear negative.   Disc levels:   T11-T12: Mild facet hypertrophy. Otherwise negative.   T12-L1:  Mild facet hypertrophy. Otherwise negative.   L1-L2: L1 greater than L2 endplate deformities with marrow edema and heterogeneous intervening disc space. Possible hemorrhage within the disc space. Circumferential disc osteophyte complex and superimposed right far lateral disc herniation. Moderate facet and ligament flavum hypertrophy. Additionally, bilateral posteriorly situated synovial cysts (series 7, image 10 on the left) which should not cause neural compromise.   Mild to moderate spinal stenosis here at the level of the conus (series 8, image 9), but no definite conus signal abnormality. Mild left and moderate to severe right L1 foraminal stenosis.   L2-L3: Disc desiccation and disc space loss. Circumferential disc bulge with severe ligament flavum and moderate facet hypertrophy. Mild spinal stenosis. Borderline to mild neural foraminal stenosis.   L3-L4: Grade 1 anterolisthesis with advanced disc space loss. Circumferential disc osteophyte complex/pseudo disc. Severe facet hypertrophy. Moderate to severe ligament flavum hypertrophy. Severe spinal and bilateral lateral recess stenosis (series 8, image 21, descending L4 nerve levels). Mild to moderate left and moderate to severe right L3 foraminal stenosis.   L4-L5: Severe chronic disc space loss with bulky circumferential disc osteophyte complex. Broad-based posterior component. Moderate facet and ligament flavum hypertrophy. Only mild spinal stenosis but moderate lateral recess stenosis (L5 nerve levels), moderate right and moderate to severe left L4 neural foraminal stenosis.   L5-S1: Chronic severe disc space  loss with evidence of some interbody ankylosis at this level (series 6, image 8). Mild to moderate facet hypertrophy. No spinal or convincing lateral recess stenosis. Moderate left L5 foraminal stenosis.   IMPRESSION: 1. Transitional anatomy. Assuming normal lumbar segmentation there appear to be absent ribs at T12.   2. Subacute compression fracture of the L1 body with anterior wedging, up 42% anterior loss of height. No retropulsion. But similar marrow edema in the L2 posterosuperior endplate which might reflect impending collapse. And heterogeneous disc with possible hemorrhage into the disc space, large right far lateral disc herniation. Subsequent moderate spinal stenosis at the level of the conus. Moderate to severe right foraminal stenosis. Edema in the medial right psoas muscle. No conus signal abnormality identified.   3. Superimposed grade 1 anterolisthesis at L3-L4 with advanced disc and posterior element degeneration resulting in severe spinal and bilateral lateral recess stenosis. Moderate to severe right foraminal stenosis.   4. Mild spinal stenosis and moderate lateral recess stenosis at L4-L5 with moderate to severe left greater than right L4 foraminal stenosis.   5. Advanced disc degeneration but also evidence of interbody ankylosis at L5-S1.  Electronically Signed   By: Genevie Ann M.D.   On: 06/05/2021 13:30     PMFS History: Patient Active Problem List   Diagnosis Date Noted   Closed compression fracture of body of L1 vertebra ([place]) 07/05/2021   Peripheral arterial disease ([place]) 03/17/2021   Onychomycosis 02/28/2021   Right leg pain 07/26/2020   Onychodystrophy 07/26/2020   CKD (chronic kidney disease) stage 4, GFR 15-29 ml/min (HCC) 07/19/2020   Hypertensive heart and renal disease 03/22/2020   Gout 12/07/2017   Type 2 diabetes mellitus with stage 3 chronic kidney disease, without long-term current use of insulin ([place]) 02/11/2014   History of  coronary artery bypass graft 07/01/2010   MYOCARDIAL INFARCTION, INFERIOR WALL, SUBSEQUENT CARE 05/12/2008   CAD, NATIVE VESSEL 05/12/2008   HYPERLIPIDEMIA-MIXED 05/11/2008   Essential hypertension 05/11/2008   CAD, UNSPECIFIED SITE 05/11/2008   CEREBROVASCULAR DISEASE 05/11/2008   Rheumatoid arthritis ([place]) 05/11/2008   Past Medical History:  Diagnosis Date   Cerebrovascular disease, unspecified    Coronary atherosclerosis of unspecified type of vessel, native or graft    Diabetes mellitus without complication ([place])    Other and unspecified hyperlipidemia    Rheumatoid arthritis(714.0)    Shingles 2003   Unspecified essential hypertension     Family History  Problem Relation Age of Onset   Cancer Mother    Hypertension Father    Cancer Brother    Hypertension Paternal Grandmother    Heart disease Brother    Diabetes Neg Hx    Coronary artery disease Neg Hx     Past Surgical History:  Procedure Laterality Date   Cardiac Bypass  2003   CATARACT EXTRACTION, BILATERAL  2007   Social History   Occupational History   Occupation: retired  Tobacco Use   Smoking status: Never   Smokeless tobacco: Never  Vaping Use   Vaping Use: Never used  Substance and Sexual Activity   Alcohol use: No   Drug use: No   Sexual activity: Not Currently

## 2021-07-09 ENCOUNTER — Ambulatory Visit (INDEPENDENT_AMBULATORY_CARE_PROVIDER_SITE_OTHER): Payer: Medicare Other

## 2021-07-09 DIAGNOSIS — R531 Weakness: Secondary | ICD-10-CM | POA: Diagnosis not present

## 2021-07-09 NOTE — Progress Notes (Signed)
SITUATION Reason for Visit: Dispensation and Fitting of Icard Patient Report: Patient reports comfort in ambulation and understands all instructions.  OBJECTIVE DATA Patient History / Diagnosis:     ICD-10-CM   1. Right sided weakness  R53.1       Provided Device:  Custom Molded Gauntlet: Style [place] Standard  Goals of Orthosis: - Improve gait - Decrease energy expenditure during the gait cycle - Improve balance - Stabilize motion at ankle and subtalar joint - Compensate for muscle weakness - Facilitate motion - Provide triplanar ankle and foot stabilization for weight bearing activities  Device Justification: - Patient is ambulatory  - Device is medically necessary as part of the overall treatment due to the patient's condition and related symptoms - It is anticipated that the patient will benefit functionally with use of the device.  - The custom device is utilized in an attempt to avoid the need for surgery and because a prefabricated device is inappropriate.  Upon gait analysis, the device appeared to be fitting well and the patient states that the device is comfortable.  ACTIONS PERFORMED Patient was fit with Restaurant manager, fast food. Patient tolerated fitting procedure. Fit of the device is good. Patient was able to apply properly and ambulate without distress. Device function is to restrict and limit motion and provide stabilization in the ankle joint.   Goals and function of this device were explained in detail to the patient. The patient was shown how to properly apply, wear, and care for the device. It was explained that the device will fit and function best in an adjustable-closure shoe with a firm heel counter and a wide base of support. When the device was dispensed, it was suitable for the patient's condition and not substandard. No guarantees were given. Precautions were reviewed.   Written instructions, warranty information, and a  copy of DMEPOS Supplier Standards were provided. All questions answered and concerns addressed.  PLAN Patient is to follow up in one week or as necessary (PRN). Plan of care was discussed with and agreed upon by patient.

## 2021-07-17 ENCOUNTER — Encounter: Payer: Self-pay | Admitting: Internal Medicine

## 2021-07-17 ENCOUNTER — Ambulatory Visit (INDEPENDENT_AMBULATORY_CARE_PROVIDER_SITE_OTHER): Payer: Medicare Other | Admitting: Internal Medicine

## 2021-07-17 VITALS — BP 110/62 | HR 75 | Temp 98.3°F | Ht 64.0 in | Wt 134.0 lb

## 2021-07-17 DIAGNOSIS — M069 Rheumatoid arthritis, unspecified: Secondary | ICD-10-CM

## 2021-07-17 DIAGNOSIS — E1122 Type 2 diabetes mellitus with diabetic chronic kidney disease: Secondary | ICD-10-CM | POA: Diagnosis not present

## 2021-07-17 DIAGNOSIS — Z23 Encounter for immunization: Secondary | ICD-10-CM

## 2021-07-17 DIAGNOSIS — I2510 Atherosclerotic heart disease of native coronary artery without angina pectoris: Secondary | ICD-10-CM

## 2021-07-17 DIAGNOSIS — S32010A Wedge compression fracture of first lumbar vertebra, initial encounter for closed fracture: Secondary | ICD-10-CM

## 2021-07-17 DIAGNOSIS — M48062 Spinal stenosis, lumbar region with neurogenic claudication: Secondary | ICD-10-CM

## 2021-07-17 DIAGNOSIS — E119 Type 2 diabetes mellitus without complications: Secondary | ICD-10-CM

## 2021-07-17 DIAGNOSIS — I129 Hypertensive chronic kidney disease with stage 1 through stage 4 chronic kidney disease, or unspecified chronic kidney disease: Secondary | ICD-10-CM | POA: Diagnosis not present

## 2021-07-17 DIAGNOSIS — N189 Chronic kidney disease, unspecified: Secondary | ICD-10-CM | POA: Diagnosis not present

## 2021-07-17 DIAGNOSIS — I1310 Hypertensive heart and chronic kidney disease without heart failure, with stage 1 through stage 4 chronic kidney disease, or unspecified chronic kidney disease: Secondary | ICD-10-CM | POA: Diagnosis not present

## 2021-07-17 DIAGNOSIS — N184 Chronic kidney disease, stage 4 (severe): Secondary | ICD-10-CM | POA: Diagnosis not present

## 2021-07-17 DIAGNOSIS — I10 Essential (primary) hypertension: Secondary | ICD-10-CM

## 2021-07-17 DIAGNOSIS — Z7984 Long term (current) use of oral hypoglycemic drugs: Secondary | ICD-10-CM

## 2021-07-17 DIAGNOSIS — Z6823 Body mass index (BMI) 23.0-23.9, adult: Secondary | ICD-10-CM

## 2021-07-17 MED ORDER — TETANUS-DIPHTH-ACELL PERTUSSIS 5-2.5-18.5 LF-MCG/0.5 IM SUSP: 0.5000 mL | Freq: Once | INTRAMUSCULAR | 0 refills | Status: AC

## 2021-07-17 NOTE — Progress Notes (Signed)
Rich Brave [place],acting as a Education administrator for [name], MD.,have documented all relevant documentation on the behalf of [name], MD,as directed by  [name], MD while in the presence of [name], MD.  This visit occurred during the SARS-CoV-2 public health emergency.  Safety protocols were in place, including screening questions prior to the visit, additional usage of staff PPE, and extensive cleaning of exam room while observing appropriate contact time as indicated for disinfecting solutions.  Subjective:     Patient ID: [place] , female    DOB: April 18, 1927 , 88 y.o.   MRN: [id number]   Chief Complaint  Patient presents with   Diabetes    HPI  She presents today for DM/HTN check. She reports compliance with meds. She denies headaches, chest pain and shortness of breath. She is alone for visit, has walker.    Diabetes She presents for her follow-up diabetic visit. She has type 2 diabetes mellitus. Her disease course has been stable. There are no hypoglycemic associated symptoms. Pertinent negatives for hypoglycemia include no dizziness or headaches. Pertinent negatives for diabetes include no blurred vision, no fatigue, no polydipsia, no polyphagia and no polyuria. There are no hypoglycemic complications. Risk factors for coronary artery disease include dyslipidemia, hypertension, post-menopausal and diabetes mellitus. Current diabetic treatment includes oral agent (monotherapy).  Hypertension This is a chronic problem. The current episode started more than 1 year ago. The problem has been gradually improving since onset. The problem is controlled. Pertinent negatives include no blurred vision or headaches.     Past Medical History:  Diagnosis Date   Cerebrovascular disease, unspecified    Coronary atherosclerosis of unspecified type of vessel, native or graft    Diabetes mellitus without complication ([place])    Other and unspecified hyperlipidemia     Rheumatoid arthritis(714.0)    Shingles 2003   Unspecified essential hypertension      Family History  Problem Relation Age of Onset   Cancer Mother    Hypertension Father    Cancer Brother    Hypertension Paternal Grandmother    Heart disease Brother    Diabetes Neg Hx    Coronary artery disease Neg Hx      Current Outpatient Medications:    Accu-Chek Softclix Lancets lancets, Use as instructed to check blood sugars up to 3 times a day  Dx code e11.65, Disp: 100 each, Rfl: 12   acetaminophen (TYLENOL) 650 MG CR tablet, Take 650 mg by mouth every 8 (eight) hours as needed for pain., Disp: , Rfl:    amLODipine (NORVASC) 5 MG tablet, TAKE 1 TABLET(5 MG) BY MOUTH DAILY, Disp: 90 tablet, Rfl: 2   Blood Glucose Calibration (ACCU-CHEK AVIVA) SOLN, Use with accu-check aviva machine, Disp: 1 each, Rfl: 3   Blood Glucose Monitoring Suppl (ACCU-CHEK AVIVA PLUS) w/Device KIT, Use to check  Blood sugars up to 3 times a day.  Dx code e11.65, Disp: 1 kit, Rfl: 3   carvedilol (COREG) 12.5 MG tablet, TAKE 1 TABLET(12.5 MG) BY MOUTH TWICE DAILY WITH A MEAL, Disp: 180 tablet, Rfl: 1   chlorthalidone (HYGROTON) 25 MG tablet, Take 12.5 mg by mouth daily., Disp: , Rfl:    Cholecalciferol (VITAMIN D) 50 MCG (2000 UT) CAPS, Take by mouth., Disp: , Rfl:    clindamycin (CLEOCIN) 150 MG capsule, For dentist appointments, Disp: , Rfl:    cyclobenzaprine (FLEXERIL) 5 MG tablet, Take 1 tablet (5 mg total) by mouth 2 (two) times daily between  meals as needed for muscle spasms., Disp: 20 tablet, Rfl: 0   dipyridamole-aspirin (AGGRENOX) 200-25 MG 12hr capsule, TAKE 1 CAPSULE BY MOUTH DAILY, Disp: 180 capsule, Rfl: 1   glimepiride (AMARYL) 1 MG tablet, TAKE 1/2 TABLET BY MOUTH ON MONDAYS, WEDNESDAYS, AND FRIDAYS, Disp: 36 tablet, Rfl: 1   glucosamine-chondroitin 500-400 MG tablet, Take 1 tablet by mouth 3 (three) times daily., Disp: , Rfl:    glucose blood test strip, Use as instructed to check blood sugars up to 3  times a day.  Dx code e11.65, Disp: 100 each, Rfl: 12   irbesartan (AVAPRO) 150 MG tablet, Take 1 tablet (150 mg total) by mouth daily., Disp: 90 tablet, Rfl: 2   JARDIANCE 10 MG TABS tablet, Take 10 mg by mouth daily., Disp: , Rfl:    Multiple Vitamin (MULTIVITAMIN) capsule, Take 1 capsule by mouth daily., Disp: , Rfl:    nitroGLYCERIN (NITROSTAT) 0.4 MG SL tablet, PLACE 1 TABLET UNDER THE TONGUE AT THE FIRST SIGN OF ATTACK, MAY REPEAT EVERY 5 MINUTES FOR 3 DOSES IN 15 MINUTES. IF PAIN PERSISTS CALL 911, Disp: 50 tablet, Rfl: 0   rosuvastatin (CRESTOR) 5 MG tablet, TAKE 1 TABLET BY MOUTH MONDAY THROUGH FRIDAY. DO NOT TAKE ON SATURDAY AND SUNDAY, Disp: 90 tablet, Rfl: 1   predniSONE (STERAPRED UNI-PAK 21 TAB) 10 MG (21) TBPK tablet, Take as directed (Patient not taking: Reported on 07/05/2021), Disp: 21 tablet, Rfl: 3   No Known Allergies   Review of Systems  Constitutional:  Negative for fatigue.  Eyes:  Negative for blurred vision.  Endocrine: Negative for polydipsia, polyphagia and polyuria.  Neurological:  Negative for dizziness and headaches.     Today's Vitals   07/17/21 1454  BP: 110/62  Pulse: 75  Temp: 98.3 F (36.8 C)  Weight: 134 lb (60.8 kg)  Height: '5\' 4"'  (1.626 m)  PainSc: 0-No pain   Body mass index is 23 kg/m.  Wt Readings from Last 3 Encounters:  07/17/21 134 lb (60.8 kg)  07/05/21 138 lb (62.6 kg)  05/06/21 132 lb (59.9 kg)     Objective:  Physical Exam Vitals and nursing note reviewed.  Constitutional:      Appearance: Normal appearance.  HENT:     Head: Normocephalic and atraumatic.  Cardiovascular:     Rate and Rhythm: Normal rate and regular rhythm.     Heart sounds: Normal heart sounds.  Pulmonary:     Effort: Pulmonary effort is normal.     Breath sounds: Normal breath sounds. No wheezing.  Musculoskeletal:     Cervical back: Normal range of motion.     Comments: Ambulatory w/ walker  Skin:    General: Skin is warm.  Neurological:      General: No focal deficit present.     Mental Status: She is alert.  Psychiatric:        Mood and Affect: Mood normal.        Behavior: Behavior normal.      Assessment And Plan:     1. Type 2 diabetes mellitus with stage 4 chronic kidney disease, without long-term current use of insulin (HCC) Comments: Chronic, I will check labs as below. Sx of hypoglycemia reviewed. She will rto in 3-4 months for re-evaluation. She is also followed by Renal.  - CMP14+EGFR - PTH, intact and calcium - Phosphorus - Protein electrophoresis, serum - Hemoglobin A1c  2. Hypertensive heart and renal disease with renal failure, stage 1 through stage 4 or unspecified  chronic kidney disease, without heart failure Comments: Chronic, well controlled. She is encouraged to follow low sodium diet.   3. Closed compression fracture of body of L1 vertebra (HCC) Comments: Unfortunately, this has caused her alot of pain.  She agrees to bone density.  - DG Bone Density; Future - Ambulatory referral to [facility]  4. Spinal stenosis of lumbar region with neurogenic claudication Comments: Pt advised this could also possibly contribute to her back pain. For now, I will refer her to home physical therapy for strengthening.  - Ambulatory referral to [facility]  5. Immunization due - Tdap (BOOSTRIX) 5-2.5-18.5 LF-MCG/0.5 injection; Inject 0.5 mLs into the muscle once for 1 dose.  Dispense: 0.5 mL; Refill: 0  6. BMI 23.0-23.9, adult Comments: She is encouraged to perform chair exercises daily when watching TV, aiming for at least 30 minutes five days per week.    Patient was given opportunity to ask questions. Patient verbalized understanding of the plan and was able to repeat key elements of the plan. All questions were answered to their satisfaction.   I, [name], MD, have reviewed all documentation for this visit. The documentation on 07/17/21 for the exam, diagnosis, procedures, and orders are all accurate  and complete.   IF YOU HAVE BEEN REFERRED TO A SPECIALIST, IT MAY TAKE 1-2 WEEKS TO SCHEDULE/PROCESS THE REFERRAL. IF YOU HAVE NOT HEARD FROM US/SPECIALIST IN TWO WEEKS, PLEASE GIVE [place] A CALL AT [phone].   THE PATIENT IS ENCOURAGED TO PRACTICE SOCIAL DISTANCING DUE TO THE COVID-19 PANDEMIC.

## 2021-07-17 NOTE — Patient Instructions (Signed)

## 2021-07-19 ENCOUNTER — Telehealth: Payer: Medicare Other

## 2021-07-19 ENCOUNTER — Ambulatory Visit (INDEPENDENT_AMBULATORY_CARE_PROVIDER_SITE_OTHER): Payer: Medicare Other

## 2021-07-19 DIAGNOSIS — S32010A Wedge compression fracture of first lumbar vertebra, initial encounter for closed fracture: Secondary | ICD-10-CM

## 2021-07-19 DIAGNOSIS — I739 Peripheral vascular disease, unspecified: Secondary | ICD-10-CM

## 2021-07-19 DIAGNOSIS — I10 Essential (primary) hypertension: Secondary | ICD-10-CM

## 2021-07-19 DIAGNOSIS — M48062 Spinal stenosis, lumbar region with neurogenic claudication: Secondary | ICD-10-CM

## 2021-07-19 DIAGNOSIS — M069 Rheumatoid arthritis, unspecified: Secondary | ICD-10-CM

## 2021-07-19 DIAGNOSIS — I2510 Atherosclerotic heart disease of native coronary artery without angina pectoris: Secondary | ICD-10-CM

## 2021-07-19 DIAGNOSIS — E1122 Type 2 diabetes mellitus with diabetic chronic kidney disease: Secondary | ICD-10-CM

## 2021-07-19 LAB — CMP14+EGFR
ALT: 9 IU/L (ref 0–32)
AST: 16 IU/L (ref 0–40)
Albumin/Globulin Ratio: 1.4 (ref 1.2–2.2)
Albumin: 3.8 g/dL (ref 3.5–4.6)
Alkaline Phosphatase: 72 IU/L (ref 44–121)
BUN/Creatinine Ratio: 21 (ref 12–28)
BUN: 51 mg/dL — ABNORMAL HIGH (ref 10–36)
Bilirubin Total: 0.2 mg/dL (ref 0.0–1.2)
CO2: 24 mmol/L (ref 20–29)
Calcium: 10.1 mg/dL (ref 8.7–10.3)
Chloride: 105 mmol/L (ref 96–106)
Creatinine, Ser: 2.44 mg/dL — ABNORMAL HIGH (ref 0.57–1.00)
Globulin, Total: 2.7 g/dL (ref 1.5–4.5)
Glucose: 130 mg/dL — ABNORMAL HIGH (ref 70–99)
Potassium: 4.9 mmol/L (ref 3.5–5.2)
Sodium: 143 mmol/L (ref 134–144)
Total Protein: 6.5 g/dL (ref 6.0–8.5)
eGFR: 18 mL/min/{1.73_m2} — ABNORMAL LOW (ref >59)

## 2021-07-19 LAB — PTH, INTACT AND CALCIUM: PTH: 18 pg/mL (ref 15–65)

## 2021-07-19 LAB — PROTEIN ELECTROPHORESIS, SERUM
A/G Ratio: 1.2 (ref 0.7–1.7)
Albumin ELP: 3.5 g/dL (ref 2.9–4.4)
Alpha 1: 0.2 g/dL (ref 0.0–0.4)
Alpha 2: 0.9 g/dL (ref 0.4–1.0)
Beta: 0.9 g/dL (ref 0.7–1.3)
Gamma Globulin: 0.9 g/dL (ref 0.4–1.8)
Globulin, Total: 3 g/dL (ref 2.2–3.9)

## 2021-07-19 LAB — HEMOGLOBIN A1C
Est. average glucose Bld gHb Est-mCnc: 148 mg/dL
Hgb A1c MFr Bld: 6.8 % — ABNORMAL HIGH (ref 4.8–5.6)

## 2021-07-19 LAB — PHOSPHORUS: Phosphorus: 4.2 mg/dL (ref 3.0–4.3)

## 2021-07-22 NOTE — Chronic Care Management (AMB) (Signed)
Chronic Care Management   CCM RN Visit Note  07/19/2021 Name: [name] MRN: [id number] DOB: 10-21-1927  Subjective: [name] is a 88 y.o. year old female who is a primary care patient of Glendale Chard, MD. The care management team was consulted for assistance with disease management and care coordination needs.    Engaged with patient by telephone for follow up visit in response to provider referral for case management and/or care coordination services.   Consent to Services:  The patient was given information about Chronic Care Management services, agreed to services, and gave verbal consent prior to initiation of services.  Please see initial visit note for detailed documentation.   Patient agreed to services and verbal consent obtained.   Assessment: Review of patient past medical history, allergies, medications, health status, including review of consultants reports, laboratory and other test data, was performed as part of comprehensive evaluation and provision of chronic care management services.   SDOH (Social Determinants of Health) assessments and interventions performed: Yes, SW referral sent   CCM Care Plan  No Known Allergies  Outpatient Encounter Medications as of 07/19/2021  Medication Sig   Accu-Chek Softclix Lancets lancets Use as instructed to check blood sugars up to 3 times a day  Dx code e11.65   acetaminophen (TYLENOL) 650 MG CR tablet Take 650 mg by mouth every 8 (eight) hours as needed for pain.   amLODipine (NORVASC) 5 MG tablet TAKE 1 TABLET(5 MG) BY MOUTH DAILY   Blood Glucose Calibration (ACCU-CHEK AVIVA) SOLN Use with accu-check aviva machine   Blood Glucose Monitoring Suppl (ACCU-CHEK AVIVA PLUS) w/Device KIT Use to check  Blood sugars up to 3 times a day.  Dx code e11.65   carvedilol (COREG) 12.5 MG tablet TAKE 1 TABLET(12.5 MG) BY MOUTH TWICE DAILY WITH A MEAL   chlorthalidone (HYGROTON) 25 MG tablet Take 12.5 mg by mouth daily.    Cholecalciferol (VITAMIN D) 50 MCG (2000 UT) CAPS Take by mouth.   clindamycin (CLEOCIN) 150 MG capsule For dentist appointments   cyclobenzaprine (FLEXERIL) 5 MG tablet Take 1 tablet (5 mg total) by mouth 2 (two) times daily between meals as needed for muscle spasms.   dipyridamole-aspirin (AGGRENOX) 200-25 MG 12hr capsule TAKE 1 CAPSULE BY MOUTH DAILY   glimepiride (AMARYL) 1 MG tablet TAKE 1/2 TABLET BY MOUTH ON MONDAYS, WEDNESDAYS, AND FRIDAYS   glucosamine-chondroitin 500-400 MG tablet Take 1 tablet by mouth 3 (three) times daily.   glucose blood test strip Use as instructed to check blood sugars up to 3 times a day.  Dx code e11.65   irbesartan (AVAPRO) 150 MG tablet Take 1 tablet (150 mg total) by mouth daily.   JARDIANCE 10 MG TABS tablet Take 10 mg by mouth daily.   Multiple Vitamin (MULTIVITAMIN) capsule Take 1 capsule by mouth daily.   nitroGLYCERIN (NITROSTAT) 0.4 MG SL tablet PLACE 1 TABLET UNDER THE TONGUE AT THE FIRST SIGN OF ATTACK, MAY REPEAT EVERY 5 MINUTES FOR 3 DOSES IN 15 MINUTES. IF PAIN PERSISTS CALL 911   predniSONE (STERAPRED UNI-PAK 21 TAB) 10 MG (21) TBPK tablet Take as directed (Patient not taking: Reported on 07/05/2021)   rosuvastatin (CRESTOR) 5 MG tablet TAKE 1 TABLET BY MOUTH MONDAY THROUGH FRIDAY. DO NOT TAKE ON SATURDAY AND SUNDAY   No facility-administered encounter medications on file as of 07/19/2021.    Patient Active Problem List   Diagnosis Date Noted   Closed compression fracture of body of L1 vertebra ([place]) 07/05/2021  Peripheral arterial disease ([place]) 03/17/2021   Onychomycosis 02/28/2021   Right leg pain 07/26/2020   Onychodystrophy 07/26/2020   CKD (chronic kidney disease) stage 4, GFR 15-29 ml/min (HCC) 07/19/2020   Hypertensive heart and renal disease 03/22/2020   Gout 12/07/2017   Type 2 diabetes mellitus with stage 3 chronic kidney disease, without long-term current use of insulin ([place]) 02/11/2014   History of coronary artery bypass graft  07/01/2010   MYOCARDIAL INFARCTION, INFERIOR WALL, SUBSEQUENT CARE 05/12/2008   CAD, NATIVE VESSEL 05/12/2008   HYPERLIPIDEMIA-MIXED 05/11/2008   Essential hypertension 05/11/2008   CAD, UNSPECIFIED SITE 05/11/2008   CEREBROVASCULAR DISEASE 05/11/2008   Rheumatoid arthritis ([place]) 05/11/2008    Conditions to be addressed/monitored: DM related to type 2 with stage 3 CKD, Rheumatoid Arthritis, HTN, PAD, CAD   Care Plan : RN Care Manager Plan of Care  Updates made by Lynne Logan, RN since 07/19/2021 12:00 AM     Problem: No plan of care established for management of chronic disease states (DM related to type 2 with stage 3 CKD, Rheumatoid Arthritis, HTN, PAD, CAD)   Priority: High     Long-Range Goal: Development of plan of care for chronic disease management for DM related to type 2 with stage 3 CKD, Rheumatoid Arthritis, HTN, PAD, CAD   Start Date: 01/21/2021  Expected End Date: 01/21/2022  Recent Progress: On track  Priority: High  Note:   Current Barriers:  Knowledge Deficits related to plan of care for management of DM related to type 2 with stage 3 CKD, Rheumatoid Arthritis, HTN, PAD, CAD   Chronic Disease Management support and education needs related to DM related to type 2 with stage 3 CKD, Rheumatoid Arthritis, HTN, PAD, CAD    RNCM Clinical Goal(s):  Patient will verbalize basic understanding of  HTN and DMII disease process and self health management plan as evidenced by DM related to type 2 with stage 3 CKD, Rheumatoid Arthritis, HTN, PAD, CAD  take all medications exactly as prescribed and will call provider for medication related questions as evidenced by patient will report having no missed doses of her prescribed medications  demonstrate Ongoing health management independence as evidenced by will continue to maintain the ability to perform Self Care and Self Health management of her chronic conditions continue to work with RN Care Manager to address care management and  care coordination needs related to  DM related to type 2 with stage 3 CKD, Rheumatoid Arthritis, HTN, PAD, CAD  as evidenced by adherence to CM Team Scheduled appointments demonstrate ongoing self health care management ability   as evidenced by    through collaboration with RN Care manager, provider, and care team.   Interventions: 1:1 collaboration with primary care provider regarding development and update of comprehensive plan of care as evidenced by provider attestation and co-signature Inter-disciplinary care team collaboration (see longitudinal plan of care) Evaluation of current treatment plan related to  self management and patient's adherence to plan as established by provider   Chronic Kidney Disease Interventions:  (Status:  pending labs.  Not addressed this visit.) Long Term Goal Assessed the Patient understanding of chronic kidney disease    Evaluation of current treatment plan related to chronic kidney disease self management and patient's adherence to plan as established by provider      Reviewed prescribed diet increase water to 48 oz daily Assessed social determinant of health barriers    Provided education on kidney disease progression    Directed patient  to discuss with Nephrologist at next scheduled visit, dietary and fluid recommendations Educated patient while providing rationale re: signs/symptoms that may occur with stage IV chronic kidney disease and encouraged patient to notify her provider if symptoms occur Discussed plans with patient for ongoing care management follow up and provided patient with direct contact information for care management team Last practice recorded BP readings:  BP Readings from Last 3 Encounters:  03/14/21 128/60  03/14/21 128/60  11/14/20 130/62  Most recent eGFR/CrCl:  Lab Results  Component Value Date   EGFR 23 (L) 03/14/2021    No components found for: CRCL   Diabetes Interventions:  (Status:  Pending labs.  Not addressed this  visit.) Long Term Goal Assessed patient's understanding of A1c goal: <6.5% Provided education to patient about basic DM disease process Reviewed medications with patient and discussed importance of medication adherence Counseled on importance of regular laboratory monitoring as prescribed Advised patient, providing education and rationale, to check cbg daily before breakfast and at bedtime and record, calling PCP for findings outside established parameters Review of patient status, including review of consultants reports, relevant laboratory and other test results, and medications completed Educated patient on dietary and exercise recommendations; daily glycemic control FBS 80-130, <180 after meals;15'15' rule Mailed printed educational materials related to Carb Choices Discussed plans with patient for ongoing care management follow up and provided patient with direct contact information for care management team Lab Results  Component Value Date   HGBA1C 6.8 (H) 03/14/2021    Pain Interventions:  (Status:  Goal on track:  Yes.) Long Term Goal Evaluation of current treatment plan related to pain self management and patient's adherence to plan as established by provider Reviewed and discussed recent follow up with Orthopedic surgeon, Dr. Lorin Mercy;  Assessment & Plan: Visit Diagnoses:  1. Closed compression fracture of body of L1 vertebra (HCC)   Plan: Patient has a subacute L1 compression fracture.  Pain should get better and patient is already on vitamin D and needs to add calcium.  She has stage III kidney disease diabetes long-term use of insulin and hypertension.  No surgery is needed.  No cement augmentation is required.  Her pain should improve as the subacute fracture heals and she is already noted improvement from last month.  She can follow-up in a month. Follow-Up Instructions: No follow-ups on file.  Orders:  No orders of the defined types were placed in this encounter. No orders of the  defined types were placed in this encounter Determined patient verbalizes understanding of her treatment recommendations Educated patient on precautions and fall prevention tips  Encouraged patient to take her time with ambulation and to balance her activity with rest Educated patient on nonpharmacologic interventions to help manage pain, patient is using peppermint essential oil for pain control with good effectiveness Patient's son continues to be available to provide transportation and attend medical appointments with her, however she requested resources for transportation Determined patient is having difficulty performing light house chores and would like resources for custodial care Sent in basket message to embedded BSW requesting assistance   Discussed plans with patient for ongoing care management follow up and provided patient with direct contact information for care management team   Hypertension Interventions:  (Status:  Goal on track:  Yes.) Long Term Goal Last practice recorded BP readings:  BP Readings from Last 3 Encounters:  07/17/21 110/62  07/05/21 109/64  05/09/21 (!) 169/72  Most recent eGFR/CrCl:  Lab Results  Component Value Date  EGFR 18 (L) 07/17/2021    No components found for: CRCL Evaluation of current treatment plan related to hypertension self management and patient's adherence to plan as established by provider Review of patient status, including review of consultant's reports, relevant laboratory and other test results Discussed complications of poorly controlled blood pressure such as heart disease, stroke, circulatory complications, vision complications, kidney impairment, sexual dysfunction Assessed social determinant of health barriers  Patient Goals/Self-Care Activities: Take all medications as prescribed Attend all scheduled provider appointments Call pharmacy for medication refills 3-7 days in advance of running out of medications Attend church or  other social activities Perform all self care activities independently  Perform IADL's (shopping, preparing meals, housekeeping, managing finances) independently Call provider office for new concerns or questions  Work with the social worker to address care coordination needs and will continue to work with the clinical team to address health care and disease management related needs Increase water to 48 oz daily, check with your kidney doctor about dietary and fluid recommendations manage portion size take medications for blood pressure exactly as prescribed Work with the embedded BSW to explore resources for custodial care and transportation   Follow Up Plan:  Telephone follow up appointment with care management team member scheduled for:  09/06/21     Barb Merino, RN, BSN, CCM Care Management Coordinator [facility] [facility]  Direct Phone: [phone]

## 2021-07-22 NOTE — Patient Instructions (Signed)
Visit Information  Thank you for taking time to visit with me today. Please don't hesitate to contact me if I can be of assistance to you before our next scheduled telephone appointment.  Following are the goals we discussed today:  (Copy and paste patient goals from clinical care plan here)  Our next appointment is by telephone on 09/06/21 at 12 PM   Please call the care guide team at [phone] if you need to cancel or reschedule your appointment.   If you are experiencing a Mental Health or [place] or need someone to talk to, please call 1-800-273-TALK (toll free, 24 hour hotline)   Patient verbalizes understanding of instructions and care plan provided today and agrees to view in [place]. Active MyChart status and patient understanding of how to access instructions and care plan via MyChart confirmed with patient.     Barb Merino, RN, BSN, CCM Care Management Coordinator [place] [facility]  Direct Phone: [phone]

## 2021-07-29 ENCOUNTER — Ambulatory Visit: Payer: Medicare Other

## 2021-07-29 DIAGNOSIS — E1122 Type 2 diabetes mellitus with diabetic chronic kidney disease: Secondary | ICD-10-CM

## 2021-07-29 DIAGNOSIS — I10 Essential (primary) hypertension: Secondary | ICD-10-CM

## 2021-07-29 NOTE — Patient Instructions (Signed)
Social Worker Visit Information  Goals we discussed today:  Patient Goals/Self-Care Activities patient will:   - Work with SW to apply for [employer] transportation -Review mailed resources information  Materials Provided: Yes: verbal and written resource education  Patient verbalizes understanding of instructions and care plan provided today and agrees to view in [place]. Active MyChart status and patient understanding of how to access instructions and care plan via MyChart confirmed with patient.     Follow Up Plan: SW will follow up with patient by phone over the next month   Daneen Schick, BSW, CDP Social Worker, Certified Dementia Practitioner [facility] Care Management [phone]

## 2021-07-29 NOTE — Chronic Care Management (AMB) (Signed)
Chronic Care Management    Social Work Note  07/29/2021 Name: [name] MRN: [id number] DOB: 1928-01-06  [name] is a 88 y.o. year old female who is a primary care patient of Glendale Chard, MD. The CCM team was consulted to assist the patient with chronic disease management and/or care coordination needs related to: Transportation Needs  and DM II, HTN, CKD III .   Engaged with patient by telephone for follow up visit in response to provider referral for social work chronic care management and care coordination services.   Consent to Services:  The patient was given information about Chronic Care Management services, agreed to services, and gave verbal consent prior to initiation of services.  Please see initial visit note for detailed documentation.   Patient agreed to services and consent obtained.   Assessment: Review of patient past medical history, allergies, medications, and health status, including review of relevant consultants reports was performed today as part of a comprehensive evaluation and provision of chronic care management and care coordination services.     SDOH (Social Determinants of Health) assessments and interventions performed:  SDOH Interventions    Flowsheet Row Most Recent Value  SDOH Interventions   Transportation Interventions [facility] ([facility] [employer])        Advanced Directives Status: Not addressed in this encounter.  CCM Care Plan  No Known Allergies  Outpatient Encounter Medications as of 07/29/2021  Medication Sig   Accu-Chek Softclix Lancets lancets Use as instructed to check blood sugars up to 3 times a day  Dx code e11.65   acetaminophen (TYLENOL) 650 MG CR tablet Take 650 mg by mouth every 8 (eight) hours as needed for pain.   amLODipine (NORVASC) 5 MG tablet TAKE 1 TABLET(5 MG) BY MOUTH DAILY   Blood Glucose Calibration (ACCU-CHEK AVIVA) SOLN Use with accu-check aviva machine   Blood Glucose  Monitoring Suppl (ACCU-CHEK AVIVA PLUS) w/Device KIT Use to check  Blood sugars up to 3 times a day.  Dx code e11.65   carvedilol (COREG) 12.5 MG tablet TAKE 1 TABLET(12.5 MG) BY MOUTH TWICE DAILY WITH A MEAL   chlorthalidone (HYGROTON) 25 MG tablet Take 12.5 mg by mouth daily.   Cholecalciferol (VITAMIN D) 50 MCG (2000 UT) CAPS Take by mouth.   clindamycin (CLEOCIN) 150 MG capsule For dentist appointments   cyclobenzaprine (FLEXERIL) 5 MG tablet Take 1 tablet (5 mg total) by mouth 2 (two) times daily between meals as needed for muscle spasms.   dipyridamole-aspirin (AGGRENOX) 200-25 MG 12hr capsule TAKE 1 CAPSULE BY MOUTH DAILY   glimepiride (AMARYL) 1 MG tablet TAKE 1/2 TABLET BY MOUTH ON MONDAYS, WEDNESDAYS, AND FRIDAYS   glucosamine-chondroitin 500-400 MG tablet Take 1 tablet by mouth 3 (three) times daily.   glucose blood test strip Use as instructed to check blood sugars up to 3 times a day.  Dx code e11.65   irbesartan (AVAPRO) 150 MG tablet Take 1 tablet (150 mg total) by mouth daily.   JARDIANCE 10 MG TABS tablet Take 10 mg by mouth daily.   Multiple Vitamin (MULTIVITAMIN) capsule Take 1 capsule by mouth daily.   nitroGLYCERIN (NITROSTAT) 0.4 MG SL tablet PLACE 1 TABLET UNDER THE TONGUE AT THE FIRST SIGN OF ATTACK, MAY REPEAT EVERY 5 MINUTES FOR 3 DOSES IN 15 MINUTES. IF PAIN PERSISTS CALL 911   predniSONE (STERAPRED UNI-PAK 21 TAB) 10 MG (21) TBPK tablet Take as directed (Patient not taking: Reported on 07/05/2021)   rosuvastatin (CRESTOR) 5 MG tablet  TAKE 1 TABLET BY MOUTH [place]. DO NOT TAKE ON SATURDAY AND SUNDAY   No facility-administered encounter medications on file as of 07/29/2021.    Patient Active Problem List   Diagnosis Date Noted   Closed compression fracture of body of L1 vertebra ([place]) 07/05/2021   Peripheral arterial disease ([place]) 03/17/2021   Onychomycosis 02/28/2021   Right leg pain 07/26/2020   Onychodystrophy 07/26/2020   CKD (chronic kidney  disease) stage 4, GFR 15-29 ml/min (HCC) 07/19/2020   Hypertensive heart and renal disease 03/22/2020   Gout 12/07/2017   Type 2 diabetes mellitus with stage 3 chronic kidney disease, without long-term current use of insulin ([place]) 02/11/2014   History of coronary artery bypass graft 07/01/2010   MYOCARDIAL INFARCTION, INFERIOR WALL, SUBSEQUENT CARE 05/12/2008   CAD, NATIVE VESSEL 05/12/2008   HYPERLIPIDEMIA-MIXED 05/11/2008   Essential hypertension 05/11/2008   CAD, UNSPECIFIED SITE 05/11/2008   CEREBROVASCULAR DISEASE 05/11/2008   Rheumatoid arthritis ([place]) 05/11/2008    Conditions to be addressed/monitored: HTN, DMII, and CKD Stage III ; Transportation  Care Plan : Social Work Plan of Care  Updates made by Daneen Schick since 07/29/2021 12:00 AM     Problem: Barriers to Treatment      Goal: Barriers to Treatment Identified and Managed   Start Date: 07/29/2021  Priority: High  Note:   Current Barriers:  Chronic disease management support and education needs related to HTN, DM, and CKD Stage III   Transportation  Social Worker Clinical Goal(s):  patient will work with SW to identify and address any acute and/or chronic care coordination needs related to the self health management of HTN, DM, and CKD Stage III   patient will work with SW to address concerns related to transportation SW Interventions:  Inter-disciplinary care team collaboration (see longitudinal plan of care) Collaboration with Glendale Chard, MD regarding development and update of comprehensive plan of care as evidenced by provider attestation and co-signature Collaboration with [place] who requests SW contact the patient to assist with transportation and other needed resources Telephonic visit completed with the patient who indicates she is interested in resources for transportation and light housekeeping Educated the patient on the [employer] (formerly [facility]) - patient is  interested in this resource Initiated application on behalf of the patient Advised the patient once her primary care provider completes Part B, SW will submit to [place] Discussed the patient is in need of assistance with light housekeeping Educated the patient on the [place] in home aid program - patient is not interested due to long wait list Patient indicates she may be able to pay out of pocket, mailed the patient a Production designer, theatre/television/film for review Assessed for patient interest in meals on wheels - patient is currently active with this program Discussed  follow up with SW to address resource needs over the next month; patient to remain actively involved with  RN Case Manager  to address care management needs Patient Goals/Self-Care Activities patient will:   -  Work with SW to apply for [employer] transportation -Review mailed resources information  Follow Up Plan:  SW to submit transportation application once received from provider       Follow Up Plan: SW will follow up with patient by phone over the next month      Daneen Schick, Arita Miss, CDP Social Worker, Certified Dementia Practitioner [facility] [phone]

## 2021-08-05 ENCOUNTER — Other Ambulatory Visit: Payer: Self-pay | Admitting: Internal Medicine

## 2021-08-05 DIAGNOSIS — I1310 Hypertensive heart and chronic kidney disease without heart failure, with stage 1 through stage 4 chronic kidney disease, or unspecified chronic kidney disease: Secondary | ICD-10-CM

## 2021-08-06 ENCOUNTER — Ambulatory Visit (INDEPENDENT_AMBULATORY_CARE_PROVIDER_SITE_OTHER): Payer: Medicare Other | Admitting: Orthopaedic Surgery

## 2021-08-06 DIAGNOSIS — I2510 Atherosclerotic heart disease of native coronary artery without angina pectoris: Secondary | ICD-10-CM

## 2021-08-06 DIAGNOSIS — M1611 Unilateral primary osteoarthritis, right hip: Secondary | ICD-10-CM

## 2021-08-06 DIAGNOSIS — M1711 Unilateral primary osteoarthritis, right knee: Secondary | ICD-10-CM

## 2021-08-06 DIAGNOSIS — S32010A Wedge compression fracture of first lumbar vertebra, initial encounter for closed fracture: Secondary | ICD-10-CM | POA: Diagnosis not present

## 2021-08-06 NOTE — Progress Notes (Signed)
Office Visit Note   Patient: [name]           Date of Birth: 1927/05/06           MRN: [id number] Visit Date: 08/06/2021              Requested by: Glendale Chard, [address] [place],  [place] [address] PCP: Glendale Chard, MD   Assessment & Plan: Visit Diagnoses:  1. Closed compression fracture of body of L1 vertebra (HCC)   2. Arthritis of right hip   3. Unilateral primary osteoarthritis, right knee     Plan: Her hip but degenerative changes and protrusio more likely related to rheumatoid arthritis.  Right knee shows bone-on-bone changes and some chondrocalcinosis involving the meniscus without marginal erosions which does suggest knee is more likely osteoarthritis versus rheumatoid arthritis.  In any event she has had previous injection which did not work.  She does not really want to have surgery I would like to do home PT which is being set up by her PCP.  We discussed she could have total hip arthroplasty if she so desires she would be at increased risk with previous history of CVA and coronary artery disease but she like to work with walker ambulation.  She can follow-up as needed.  Her L1 compression fracture symptoms have resolved.  Follow-Up Instructions: Return if symptoms worsen or fail to improve.   Orders:  No orders of the defined types were placed in this encounter.  No orders of the defined types were placed in this encounter.     Procedures: No procedures performed   Clinical Data: No additional findings.   Subjective: Chief Complaint  Patient presents with   Lower Back - Follow-up    HPI 88 year old female with rheumatoid arthritis seen in follow-up for L1 compression fracture after fall.  States her back is better but she is primarily having pain in her right thigh difficulty moving her knee with internal/external rotation and pain that is in her groin that radiates down to her knee.  She has bone-on-bone changes on x-ray of  her knee and also on lumbar x-rays previously obtained shows she has acetabular protrusio on the right with complete loss of joint space with unilateral hip osteoarthritis.  She also has stage IV kidney disease previous CVA.  She states her PCP has ordered some home therapy for her which is supposed to start shortly.  Patient has a birthday in 2 days.  Review of Systems all the systems noncontributory to HPI.   Objective: Vital Signs: There were no vitals taken for this visit.  Physical Exam Constitutional:      Appearance: She is well-developed.  HENT:     Head: Normocephalic.     Right Ear: External ear normal.     Left Ear: External ear normal. There is no impacted cerumen.  Eyes:     Pupils: Pupils are equal, round, and reactive to light.  Neck:     Thyroid: No thyromegaly.     Trachea: No tracheal deviation.  Cardiovascular:     Rate and Rhythm: Normal rate.  Pulmonary:     Effort: Pulmonary effort is normal.  Abdominal:     Palpations: Abdomen is soft.  Musculoskeletal:     Cervical back: No rigidity.  Skin:    General: Skin is warm and dry.  Neurological:     Mental Status: She is alert and oriented to person, place, and time.  Psychiatric:  Behavior: Behavior normal.     Ortho Exam patient's pain is reproduced in her thigh into her knee with internal rotation of her hip limited only 5 degrees.  She has crepitus with knee range of motion and tenderness medial lateral joint line without knee effusion.  Specialty Comments:  No specialty comments available.  Imaging: No results found.   PMFS History: Patient Active Problem List   Diagnosis Date Noted   Arthritis of right hip 08/06/2021   Unilateral primary osteoarthritis, right knee 08/06/2021   Closed compression fracture of body of L1 vertebra (HCC) 07/05/2021   Peripheral arterial disease ([place]) 03/17/2021   Onychomycosis 02/28/2021   Right leg pain 07/26/2020   Onychodystrophy 07/26/2020   CKD  (chronic kidney disease) stage 4, GFR 15-29 ml/min (HCC) 07/19/2020   Hypertensive heart and renal disease 03/22/2020   Gout 12/07/2017   Type 2 diabetes mellitus with stage 3 chronic kidney disease, without long-term current use of insulin ([place]) 02/11/2014   History of coronary artery bypass graft 07/01/2010   MYOCARDIAL INFARCTION, INFERIOR WALL, SUBSEQUENT CARE 05/12/2008   CAD, NATIVE VESSEL 05/12/2008   HYPERLIPIDEMIA-MIXED 05/11/2008   Essential hypertension 05/11/2008   CAD, UNSPECIFIED SITE 05/11/2008   CEREBROVASCULAR DISEASE 05/11/2008   Rheumatoid arthritis ([place]) 05/11/2008   Past Medical History:  Diagnosis Date   Cerebrovascular disease, unspecified    Coronary atherosclerosis of unspecified type of vessel, native or graft    Diabetes mellitus without complication ([place])    Other and unspecified hyperlipidemia    Rheumatoid arthritis(714.0)    Shingles 2003   Unspecified essential hypertension     Family History  Problem Relation Age of Onset   Cancer Mother    Hypertension Father    Cancer Brother    Hypertension Paternal Grandmother    Heart disease Brother    Diabetes Neg Hx    Coronary artery disease Neg Hx     Past Surgical History:  Procedure Laterality Date   Cardiac Bypass  2003   CATARACT EXTRACTION, BILATERAL  2007   Social History   Occupational History   Occupation: retired  Tobacco Use   Smoking status: Never   Smokeless tobacco: Never  Vaping Use   Vaping Use: Never used  Substance and Sexual Activity   Alcohol use: No   Drug use: No   Sexual activity: Not Currently

## 2021-08-13 ENCOUNTER — Ambulatory Visit: Payer: Self-pay

## 2021-08-13 DIAGNOSIS — I1310 Hypertensive heart and chronic kidney disease without heart failure, with stage 1 through stage 4 chronic kidney disease, or unspecified chronic kidney disease: Secondary | ICD-10-CM

## 2021-08-13 DIAGNOSIS — I10 Essential (primary) hypertension: Secondary | ICD-10-CM

## 2021-08-13 DIAGNOSIS — I739 Peripheral vascular disease, unspecified: Secondary | ICD-10-CM

## 2021-08-13 DIAGNOSIS — E1122 Type 2 diabetes mellitus with diabetic chronic kidney disease: Secondary | ICD-10-CM

## 2021-08-14 ENCOUNTER — Other Ambulatory Visit: Payer: Self-pay | Admitting: Internal Medicine

## 2021-08-14 DIAGNOSIS — E2839 Other primary ovarian failure: Secondary | ICD-10-CM

## 2021-08-14 DIAGNOSIS — S32010A Wedge compression fracture of first lumbar vertebra, initial encounter for closed fracture: Secondary | ICD-10-CM

## 2021-08-15 ENCOUNTER — Other Ambulatory Visit (INDEPENDENT_AMBULATORY_CARE_PROVIDER_SITE_OTHER): Payer: Medicare Other | Admitting: Internal Medicine

## 2021-08-15 DIAGNOSIS — I2510 Atherosclerotic heart disease of native coronary artery without angina pectoris: Secondary | ICD-10-CM

## 2021-08-15 DIAGNOSIS — M069 Rheumatoid arthritis, unspecified: Secondary | ICD-10-CM

## 2021-08-15 DIAGNOSIS — I1310 Hypertensive heart and chronic kidney disease without heart failure, with stage 1 through stage 4 chronic kidney disease, or unspecified chronic kidney disease: Secondary | ICD-10-CM | POA: Diagnosis not present

## 2021-08-15 DIAGNOSIS — S32010A Wedge compression fracture of first lumbar vertebra, initial encounter for closed fracture: Secondary | ICD-10-CM

## 2021-08-15 DIAGNOSIS — K862 Cyst of pancreas: Secondary | ICD-10-CM

## 2021-08-15 DIAGNOSIS — I739 Peripheral vascular disease, unspecified: Secondary | ICD-10-CM | POA: Diagnosis not present

## 2021-08-15 DIAGNOSIS — M1611 Unilateral primary osteoarthritis, right hip: Secondary | ICD-10-CM

## 2021-08-15 DIAGNOSIS — E1122 Type 2 diabetes mellitus with diabetic chronic kidney disease: Secondary | ICD-10-CM

## 2021-08-15 DIAGNOSIS — M25561 Pain in right knee: Secondary | ICD-10-CM

## 2021-08-15 DIAGNOSIS — N184 Chronic kidney disease, stage 4 (severe): Secondary | ICD-10-CM

## 2021-08-15 DIAGNOSIS — I679 Cerebrovascular disease, unspecified: Secondary | ICD-10-CM

## 2021-08-15 DIAGNOSIS — M48062 Spinal stenosis, lumbar region with neurogenic claudication: Secondary | ICD-10-CM

## 2021-08-15 NOTE — Progress Notes (Signed)
Received home health orders orders from [facility]. Start of care 08/03/21.   Certification and orders from 08/03/21 through 10/01/21 are reviewed, signed and faxed back to home health company.  Need of intermittent skilled services at home: PT/OT/MSW  The home health care plan has been established by me and will be reviewed and updated as needed to maximize patient recovery.  I certify that all home health services have been and will be furnished to the patient while under my care.  Face-to-face encounter in which the need for home health services was established: 07/17/21  Patient is receiving home health services for the following diagnoses: Problem List Items Addressed This Visit       Cardiovascular and Mediastinum   CAD, NATIVE VESSEL   Hypertensive heart and renal disease   Peripheral arterial disease (HCC)     Musculoskeletal and Integument   Rheumatoid arthritis ([place])   Closed compression fracture of body of L1 vertebra ([place])   Other Visit Diagnoses     Type 2 diabetes mellitus with stage 4 chronic kidney disease, without long-term current use of insulin ([place])    -  Primary   Spinal stenosis of lumbar region with neurogenic claudication       PAD (peripheral artery disease) ([place])       Recurrent pain of right knee       Pancreas cyst            [name], MD

## 2021-08-16 DIAGNOSIS — I129 Hypertensive chronic kidney disease with stage 1 through stage 4 chronic kidney disease, or unspecified chronic kidney disease: Secondary | ICD-10-CM | POA: Diagnosis not present

## 2021-08-16 DIAGNOSIS — E1122 Type 2 diabetes mellitus with diabetic chronic kidney disease: Secondary | ICD-10-CM | POA: Diagnosis not present

## 2021-08-16 DIAGNOSIS — Z7984 Long term (current) use of oral hypoglycemic drugs: Secondary | ICD-10-CM | POA: Diagnosis not present

## 2021-08-16 DIAGNOSIS — N184 Chronic kidney disease, stage 4 (severe): Secondary | ICD-10-CM

## 2021-08-22 ENCOUNTER — Other Ambulatory Visit: Payer: Self-pay | Admitting: Internal Medicine

## 2021-08-22 DIAGNOSIS — I2510 Atherosclerotic heart disease of native coronary artery without angina pectoris: Secondary | ICD-10-CM

## 2021-08-30 ENCOUNTER — Ambulatory Visit: Payer: Medicare Other | Admitting: Podiatry

## 2021-09-02 ENCOUNTER — Telehealth: Payer: Medicare Other

## 2021-09-02 ENCOUNTER — Ambulatory Visit: Payer: Self-pay

## 2021-09-02 DIAGNOSIS — I739 Peripheral vascular disease, unspecified: Secondary | ICD-10-CM

## 2021-09-02 DIAGNOSIS — M069 Rheumatoid arthritis, unspecified: Secondary | ICD-10-CM

## 2021-09-02 DIAGNOSIS — I10 Essential (primary) hypertension: Secondary | ICD-10-CM

## 2021-09-02 DIAGNOSIS — I2510 Atherosclerotic heart disease of native coronary artery without angina pectoris: Secondary | ICD-10-CM

## 2021-09-02 DIAGNOSIS — E1122 Type 2 diabetes mellitus with diabetic chronic kidney disease: Secondary | ICD-10-CM

## 2021-09-02 DIAGNOSIS — M48062 Spinal stenosis, lumbar region with neurogenic claudication: Secondary | ICD-10-CM

## 2021-09-02 NOTE — Progress Notes (Signed)
This encounter was created in error - please disregard.

## 2021-09-02 NOTE — Patient Instructions (Signed)
Visit Information  Thank you for taking time to visit with me today. Please don't hesitate to contact me if I can be of assistance to you before our next scheduled telephone appointment.  Following are the goals we discussed today:  Take all medications as prescribed Attend all scheduled provider appointments Call pharmacy for medication refills 3-7 days in advance of running out of medications Attend church or other social activities Perform all self care activities independently  Perform IADL's (shopping, preparing meals, housekeeping, managing finances) independently Call provider office for new concerns or questions  Work with the social worker to address care coordination needs and will continue to work with the clinical team to address health care and disease management related needs Increase water to 48 oz daily, check with your kidney doctor about dietary and fluid recommendations manage portion size take medications for blood pressure exactly as prescribed Continue to work with in home PT as directed Use your walker at all times and use fall precautions at all times  Our next appointment is by telephone on 11/29/21 at 11:30 AM  Please call the care guide team at [phone] if you need to cancel or reschedule your appointment.   If you are experiencing a Mental Health or [place] or need someone to talk to, please call 1-800-273-TALK (toll free, 24 hour hotline)   Patient verbalizes understanding of instructions and care plan provided today and agrees to view in [facility]. Active MyChart status and patient understanding of how to access instructions and care plan via MyChart confirmed with patient.     Barb Merino, RN, BSN, CCM Care Management Coordinator [facility]  Direct Phone: [phone]

## 2021-09-02 NOTE — Chronic Care Management (AMB) (Signed)
Care Management    RN Visit Note  09/02/2021 Name: [name] MRN: [id number] DOB: [birth year]  Subjective: [name] is a 88 y.o. year old female who is a primary care patient of Glendale Chard, MD. The care management team was consulted for assistance with disease management and care coordination needs.    Engaged with patient by telephone for follow up visit in response to provider referral for case management and/or care coordination services.   Consent to Services:   [name] was given information about Care Management services today including:  Care Management services includes personalized support from designated clinical staff supervised by her physician, including individualized plan of care and coordination with other care providers 24/7 contact phone numbers for assistance for urgent and routine care needs. The patient may stop case management services at any time by phone call to the office staff.  Patient agreed to services and consent obtained.   Assessment: Review of patient past medical history, allergies, medications, health status, including review of consultants reports, laboratory and other test data, was performed as part of comprehensive evaluation and provision of chronic care management services.   SDOH (Social Determinants of Health) assessments and interventions performed:  Yes, no acute needs  Care Plan  No Known Allergies  Outpatient Encounter Medications as of 09/02/2021  Medication Sig   Accu-Chek Softclix Lancets lancets Use as instructed to check blood sugars up to 3 times a day  Dx code e11.65   acetaminophen (TYLENOL) 650 MG CR tablet Take 650 mg by mouth every 8 (eight) hours as needed for pain.   amLODipine (NORVASC) 5 MG tablet TAKE 1 TABLET(5 MG) BY MOUTH DAILY   Blood Glucose Calibration (ACCU-CHEK AVIVA) SOLN Use with accu-check aviva machine   Blood Glucose Monitoring Suppl (ACCU-CHEK AVIVA PLUS) w/Device KIT Use to check   Blood sugars up to 3 times a day.  Dx code e11.65   carvedilol (COREG) 12.5 MG tablet TAKE 1 TABLET(12.5 MG) BY MOUTH TWICE DAILY WITH A MEAL   chlorthalidone (HYGROTON) 25 MG tablet Take 12.5 mg by mouth daily.   Cholecalciferol (VITAMIN D) 50 MCG (2000 UT) CAPS Take by mouth.   clindamycin (CLEOCIN) 150 MG capsule For dentist appointments   cyclobenzaprine (FLEXERIL) 5 MG tablet Take 1 tablet (5 mg total) by mouth 2 (two) times daily between meals as needed for muscle spasms.   dipyridamole-aspirin (AGGRENOX) 200-25 MG 12hr capsule TAKE 1 CAPSULE BY MOUTH DAILY   glimepiride (AMARYL) 1 MG tablet TAKE 1/2 TABLET BY MOUTH ON MONDAYS, WEDNESDAYS, AND FRIDAYS   glucosamine-chondroitin 500-400 MG tablet Take 1 tablet by mouth 3 (three) times daily.   glucose blood test strip Use as instructed to check blood sugars up to 3 times a day.  Dx code e11.65   irbesartan (AVAPRO) 150 MG tablet Take 1 tablet (150 mg total) by mouth daily.   JARDIANCE 10 MG TABS tablet Take 10 mg by mouth daily.   Multiple Vitamin (MULTIVITAMIN) capsule Take 1 capsule by mouth daily.   nitroGLYCERIN (NITROSTAT) 0.4 MG SL tablet PLACE 1 TABLET UNDER THE TONGUE AT THE FIRST SIGN OF ATTACK, MAY REPEAT EVERY 5 MINUTES FOR 3 DOSES IN 15 MINUTES. IF PAIN PERSISTS CALL 911   predniSONE (STERAPRED UNI-PAK 21 TAB) 10 MG (21) TBPK tablet Take as directed (Patient not taking: Reported on 07/05/2021)   rosuvastatin (CRESTOR) 5 MG tablet TAKE ONE TABLET BY MOUTH EVERY DAY ON MONDAY THROUGH FRIDAY.. DO NOT TAKE ON SATURDAY AND SUNDAY  No facility-administered encounter medications on file as of 09/02/2021.    Patient Active Problem List   Diagnosis Date Noted   Arthritis of right hip 08/06/2021   Unilateral primary osteoarthritis, right knee 08/06/2021   Closed compression fracture of body of L1 vertebra ([place]) 07/05/2021   Peripheral arterial disease ([place]) 03/17/2021   Onychomycosis 02/28/2021   Right leg pain 07/26/2020    Onychodystrophy 07/26/2020   CKD (chronic kidney disease) stage 4, GFR 15-29 ml/min (HCC) 07/19/2020   Hypertensive heart and renal disease 03/22/2020   Gout 12/07/2017   Type 2 diabetes mellitus with stage 3 chronic kidney disease, without long-term current use of insulin ([place]) 02/11/2014   History of coronary artery bypass graft 07/01/2010   MYOCARDIAL INFARCTION, INFERIOR WALL, SUBSEQUENT CARE 05/12/2008   CAD, NATIVE VESSEL 05/12/2008   HYPERLIPIDEMIA-MIXED 05/11/2008   Essential hypertension 05/11/2008   CAD, UNSPECIFIED SITE 05/11/2008   CEREBROVASCULAR DISEASE 05/11/2008   Rheumatoid arthritis ([place]) 05/11/2008    Conditions to be addressed/monitored:  DM related to type 2 with stage 3 CKD, Rheumatoid Arthritis, HTN, PAD, CAD, Spinal stenosis of lumbar region   Care Plan : [place] of Care  Updates made by Lynne Logan, RN since 09/02/2021 12:00 AM     Problem: No plan of care established for management of chronic disease states (DM related to type 2 with stage 3 CKD, Rheumatoid Arthritis, HTN, PAD, CAD)   Priority: High     Long-Range Goal: Development of plan of care for chronic disease management for DM related to type 2 with stage 3 CKD, Rheumatoid Arthritis, HTN, PAD, CAD   Start Date: 01/21/2021  Expected End Date: 01/21/2022  Recent Progress: On track  Priority: High  Note:   Current Barriers:  Knowledge Deficits related to plan of care for management of DM related to type 2 with stage 3 CKD, Rheumatoid Arthritis, HTN, PAD, CAD   Chronic Disease Management support and education needs related to DM related to type 2 with stage 3 CKD, Rheumatoid Arthritis, HTN, PAD, CAD    RNCM Clinical Goal(s):  Patient will verbalize basic understanding of  HTN and DMII disease process and self health management plan as evidenced by DM related to type 2 with stage 3 CKD, Rheumatoid Arthritis, HTN, PAD, CAD  take all medications exactly as prescribed and will call provider  for medication related questions as evidenced by patient will report having no missed doses of her prescribed medications  demonstrate Ongoing health management independence as evidenced by will continue to maintain the ability to perform Self Care and Self Health management of her chronic conditions continue to work with RN Care Manager to address care management and care coordination needs related to  DM related to type 2 with stage 3 CKD, Rheumatoid Arthritis, HTN, PAD, CAD  as evidenced by adherence to CM Team Scheduled appointments demonstrate ongoing self health care management ability   as evidenced by    through collaboration with RN Care manager, provider, and care team.   Interventions: 1:1 collaboration with primary care provider regarding development and update of comprehensive plan of care as evidenced by provider attestation and co-signature Inter-disciplinary care team collaboration (see longitudinal plan of care) Evaluation of current treatment plan related to  self management and patient's adherence to plan as established by provider   Chronic Kidney Disease Interventions:  (Status:  Goal Met.) Long Term Goal Assessed the Patient understanding of chronic kidney disease    Evaluation of current treatment  plan related to chronic kidney disease self management and patient's adherence to plan as established by provider      Reviewed prescribed diet continue to increase your water intake to 48-64 oz daily unless otherwise directed Provided education on kidney disease progression    Last practice recorded BP readings:  BP Readings from Last 3 Encounters:  07/17/21 110/62  07/05/21 109/64  05/09/21 (!) 169/72  Most recent eGFR/CrCl:  Lab Results  Component Value Date   EGFR 18 (L) 07/17/2021    No components found for: "CRCL"   Diabetes Interventions:  (Status:  Goal Met.) Long Term Goal Assessed patient's understanding of A1c goal: <7% Provided education to patient about basic  DM disease process Review of patient status, including review of consultants reports, relevant laboratory and other test results, and medications completed Lab Results  Component Value Date   HGBA1C 6.8 (H) 07/17/2021  Pain Interventions:  (Status:  Goal Met.) Long Term Goal Evaluation of current treatment plan related to pain self management and patient's adherence to plan as established by provider Reviewed and discussed recent follow up with Orthopedic surgeon, Dr. Lorin Mercy;  Assessment & Plan: Visit Diagnoses:  1. Closed compression fracture of body of L1 vertebra (HCC)   2. Arthritis of right hip   3. Unilateral primary osteoarthritis, right knee   Plan: Her hip but degenerative changes and protrusio more likely related to rheumatoid arthritis.  Right knee shows bone-on-bone changes and some chondrocalcinosis involving the meniscus without marginal erosions which does suggest knee is more likely osteoarthritis versus rheumatoid arthritis.  In any event she has had previous injection which did not work.  She does not really want to have surgery I would like to do home PT which is being set up by her PCP.  We discussed she could have total hip arthroplasty if she so desires she would be at increased risk with previous history of CVA and coronary artery disease but she like to work with walker ambulation.  She can follow-up as needed.  Her L1 compression fracture symptoms have resolved. Follow-Up Instructions: Return if symptoms worsen or fail to improve.  Orders:  No orders of the defined types were placed in this encounter. No orders of the defined types were placed in this encounter. Determined patient verbalizes understanding of her treatment recommendations Educated patient on precautions and fall prevention tips  Encouraged patient to take her time with ambulation and to balance her activity with rest Discussed patient is working with in home PT with good effectiveness Determined patient  would like Rx for Flexeril to help with muscle spasms in her right leg, in basket message sent to PCP   Hypertension Interventions:  (Status:  Goal Met.) Long Term Goal Last practice recorded BP readings:  BP Readings from Last 3 Encounters:  07/17/21 110/62  07/05/21 109/64  05/09/21 (!) 169/72  Most recent eGFR/CrCl:  Lab Results  Component Value Date   EGFR 18 (L) 07/17/2021    No components found for: "CRCL" Evaluation of current treatment plan related to hypertension self management and patient's adherence to plan as established by provider Assessed social determinant of health barriers  Patient Goals/Self-Care Activities: Take all medications as prescribed Attend all scheduled provider appointments Call pharmacy for medication refills 3-7 days in advance of running out of medications Attend church or other social activities Perform all self care activities independently  Perform IADL's (shopping, preparing meals, housekeeping, managing finances) independently Call provider office for new concerns or questions  Work  with the social worker to address care coordination needs and will continue to work with the clinical team to address health care and disease management related needs Increase water to 48 oz daily, check with your kidney doctor about dietary and fluid recommendations manage portion size take medications for blood pressure exactly as prescribed Continue to work with in home PT as directed Use your walker at all times and use fall precautions at all times   Follow Up Plan:   patient transitioned to [place], RN, BSN, CCM Care Management Coordinator [facility]  Direct Phone: [phone]

## 2021-09-03 ENCOUNTER — Other Ambulatory Visit: Payer: Medicare Other

## 2021-09-03 ENCOUNTER — Other Ambulatory Visit: Payer: Self-pay | Admitting: Internal Medicine

## 2021-09-03 MED ORDER — TIZANIDINE HCL 4 MG PO TABS: ORAL_TABLET | ORAL | 0 refills | Status: DC

## 2021-09-12 ENCOUNTER — Ambulatory Visit: Payer: Self-pay

## 2021-09-12 NOTE — Patient Instructions (Signed)
Visit Information  Thank you for taking time to visit with me today. Please don't hesitate to contact me if I can be of assistance to you.   Following are the goals we discussed today:   Goals Addressed     Patient Stated     "My physical therapist did not show up this week" (pt-stated)        Care Coordination Interventions: Assessed for falls since last encounter Determined patient completed a visit with a PT from [facility] patient states she did not receive a return visit or receive a call from the agency regarding her next PT visit Reviewed referral and noted orders for PT for strengthening sent to [facility] on 07/27/21 per [name] Placed outbound joint call with patient to [facility] in [place], [place] [phone], left a detailed voice message requesting a call back to advise of patient's next scheduled PT visit and plan of care      Our next appointment is by telephone on 09/16/21 at 09:00 AM   Please call the care guide team at [phone] if you need to cancel or reschedule your appointment.   If you are experiencing a Mental Health or [place] or need someone to talk to, please call 1-800-273-TALK (toll free, 24 hour hotline)  Patient verbalizes understanding of instructions and care plan provided today and agrees to view in [place]. Active MyChart status and patient understanding of how to access instructions and care plan via MyChart confirmed with patient.     Barb Merino, RN, BSN, CCM Care Management Coordinator [facility] Care Management  Direct Phone: [phone]

## 2021-09-12 NOTE — Patient Outreach (Signed)
  Care Coordination   Follow Up Visit Note   09/12/2021 Name: Merissa Unangst MRN: [id number] DOB: 15-Feb-1928  Kabria Rhem is a 88 y.o. year old female who sees Glendale Chard, MD for primary care. I spoke with  [place] by phone today  What matters to the patients health and wellness today?  Patient requesting assistance contacting [facility] to check on her PT.    Goals Addressed     Patient Stated     "My physical therapist did not show up this week" (pt-stated)        Care Coordination Interventions: Assessed for falls since last encounter Determined patient completed a visit with a PT from [facility] patient states she did not receive a return visit or receive a call from the agency regarding her next PT visit Reviewed referral and noted orders for PT for strengthening sent to [facility] on 07/27/21 per [name] Placed outbound joint call with patient to [facility] in [place], [place] [phone], left a detailed voice message requesting a call back to advise of patient's next scheduled PT visit and plan of care     SDOH assessments and interventions completed:   Yes  Care Coordination Interventions Activated:  Yes Care Coordination Interventions:  Yes, provided  Follow up plan: Follow up call scheduled for 09/16/21 @09 :00 AM   Encounter Outcome:  Pt. Visit Completed

## 2021-09-13 NOTE — Progress Notes (Signed)
This encounter was created in error - please disregard.

## 2021-09-16 ENCOUNTER — Other Ambulatory Visit: Payer: Self-pay

## 2021-09-16 ENCOUNTER — Ambulatory Visit: Payer: Self-pay

## 2021-09-16 DIAGNOSIS — M48062 Spinal stenosis, lumbar region with neurogenic claudication: Secondary | ICD-10-CM

## 2021-09-16 DIAGNOSIS — N184 Chronic kidney disease, stage 4 (severe): Secondary | ICD-10-CM

## 2021-09-16 NOTE — Patient Outreach (Signed)
[facility]) Care Management  09/16/2021  [name] 03/05/1927 [id number]   Care Management   Follow Up Note   09/16/2021 Name: [name] MRN: [id number] DOB: 1928-02-03   Referred by: Glendale Chard, MD Reason for referral : No chief complaint on file.   An unsuccessful telephone outreach was attempted today. The patient was referred to the case management team for assistance with care management and care coordination.   Follow Up Plan: Telephone follow up appointment with care management team member scheduled for: 09/30/21 @12 :45 PM   Barb Merino, RN, BSN, CCM Care Management Coordinator [facility] Care Management  Direct Phone: [phone]

## 2021-09-16 NOTE — Patient Outreach (Signed)
  Care Coordination   Follow Up Visit Note   09/16/2021 Name: [name] MRN: [id number] DOB: 09-Oct-1927  [name] is a 88 y.o. year old female who sees Glendale Chard, MD for primary care. I  contacted [facility] today regarding Ms. [name].   What matters to the patients health and wellness today?  Follow up regarding patient's in home PT with [facility].    Goals Addressed       Patient Stated     "My physical therapist did not show up this week" (pt-stated)        Care Coordination Interventions: Placed outbound call to [facility] in [place], [place] [phone], left a detailed voice message requesting a call back to advise of patient's next scheduled PT visit and plan of care. Provided the contact name/number for this RN.      SDOH assessments and interventions completed:   No   Care Coordination Interventions Activated:  Yes Care Coordination Interventions:  Yes, provided  Follow up plan: Follow up call scheduled for 09/30/21 @12 :30 PM   Encounter Outcome:  Pt. Visit Completed

## 2021-09-17 ENCOUNTER — Ambulatory Visit (INDEPENDENT_AMBULATORY_CARE_PROVIDER_SITE_OTHER): Payer: Medicare Other | Admitting: Podiatry

## 2021-09-17 ENCOUNTER — Encounter: Payer: Self-pay | Admitting: Podiatry

## 2021-09-17 ENCOUNTER — Telehealth: Payer: Self-pay

## 2021-09-17 DIAGNOSIS — E1122 Type 2 diabetes mellitus with diabetic chronic kidney disease: Secondary | ICD-10-CM | POA: Diagnosis not present

## 2021-09-17 DIAGNOSIS — N183 Chronic kidney disease, stage 3 unspecified: Secondary | ICD-10-CM

## 2021-09-17 DIAGNOSIS — B351 Tinea unguium: Secondary | ICD-10-CM

## 2021-09-17 DIAGNOSIS — M79609 Pain in unspecified limb: Secondary | ICD-10-CM

## 2021-09-17 NOTE — Telephone Encounter (Signed)
   Telephone encounter was:  Successful.  09/17/2021 Name: Jaymee Tilson MRN: [id number] DOB: 02-05-1928  Lanore Haub is a 88 y.o. year old female who is a primary care patient of Glendale Chard, MD . The community resource team was consulted for assistance with Transportation Needs   Care guide performed the following interventions: Patient provided with information about care guide support team and interviewed to confirm resource needs. Patient advised she needed a ride but arranged her own for 8/1 but in the future she needs support. We then discussed resources for [employer] and patient advised she has the [employer]) application she just needs to fill it out and turn it in. CG encouraged patient to turn in the application as soon as possible in order for her to be able to use [facility] [place] with her next appt in October. Patient understood. CG called son back as he called while I was speaking to patient and explain the same information to son, the sooner the transportation application is turned in, the better. Son understood as well and they will try to have transportation forms completed by next week. CG advised patient and son if an appt comes up before October to call back and the request will be worked on from there. They both understood. At this time, there are no further questions or concerns regarding transportation. Patient will complete [employer] [place] forms.   Follow Up Plan:  No further follow up planned at this time. The patient has been provided with needed resources.  [place] management  [place], [place]  Main Phone: [phone]  E-mail: [email or web address]  Website: [email or web address]

## 2021-09-22 NOTE — Progress Notes (Signed)
  Subjective:  Patient ID: [place], female    DOB: 12-05-1927,  MRN: [id number]  Kathye Santillano presents to clinic today for at risk foot care. Pt has h/o NIDDM with chronic kidney disease and painful elongated mycotic toenails 1-5 bilaterally which are tender when wearing enclosed shoe gear. Pain is relieved with periodic professional debridement.  Patient states blood glucose was 132 mg/dl today.  Last known HgA1c was unknown.    She has h/o CVA with right sided weakness.  New problem(s): None.   PCP is Glendale Chard, MD , and last visit was  August 15, 2021  No Known Allergies  Review of Systems: Negative except as noted in the HPI.  Objective: No changes noted in today's physical examination.  Vascular Examination: CFT <4 seconds b/l. DP/PT pulses faintly palpable b/l. Skin temperature gradient warm to warm b/l. No ischemia or gangrene. No cyanosis or clubbing noted b/l. Pedal hair sparse. Trace edema noted BLE.   Neurological Examination: Sensation grossly intact b/l with 10 gram monofilament. Vibratory sensation intact b/l.   Dermatological Examination: Pedal skin warm and supple b/l. Toenails 1-5 b/l thick, discolored, elongated with subungual debris and pain on dorsal palpation.  No hyperkeratotic nor porokeratotic lesions present on today's visit.  Musculoskeletal Examination: Muscle strength 5/5 to all LE muscle groups of left lower extremity. Muscle strength 4/5to all LE muscle groups of right lower extremity.  Radiographs: None  Last A1c:      Latest Ref Rng & Units 07/17/2021    3:49 PM 03/14/2021    3:30 PM 11/14/2020    3:24 PM  Hemoglobin A1C  Hemoglobin-A1c 4.8 - 5.6 % 6.8  6.8  6.9    Assessment/Plan: 1. Pain due to onychomycosis of nail   2. Type 2 diabetes mellitus with stage 3 chronic kidney disease, without long-term current use of insulin, unspecified whether stage 3a or 3b CKD ([place])     -Patient was evaluated and treated. All patient's and/or  POA's questions/concerns answered on today's visit. -Patient to continue soft, supportive shoe gear daily. -Mycotic toenails 1-5 bilaterally were debrided in length and girth with sterile nail nippers and dremel without incident. -Patient/POA to call should there be question/concern in the interim.   Return in about 3 months (around 12/18/2021).  Marzetta Board, DPM

## 2021-09-30 ENCOUNTER — Ambulatory Visit: Payer: Self-pay

## 2021-09-30 NOTE — Patient Instructions (Signed)
Visit Information  Thank you for taking time to visit with me today. Please don't hesitate to contact me if I can be of assistance to you.   Following are the goals we discussed today:   Goals Addressed       Patient Stated     "My physical therapist did not show up this week" (pt-stated)        Care Coordination Interventions: Confirmed patient received a PT visit from [facility] patient has been discharged from these services, she feels she needs ongoing continued PT due to having decreased strength and endurance Educated patient about [facility] Determined patient would like to explore this option with her son and would like to complete a joint SW call when able to learn more about her level of care options SW referral sent - Collaborated with Daneen Schick BSW regarding patient's request for assistance with level of care options and home delivered meals     Our next appointment is by telephone on 10/14/21 at 02:30 PM   Please call the care guide team at [phone] if you need to cancel or reschedule your appointment.   If you are experiencing a Mental Health or [place] or need someone to talk to, please call 1-800-273-TALK (toll free, 24 hour hotline)  Patient verbalizes understanding of instructions and care plan provided today and agrees to view in [place]. Active MyChart status and patient understanding of how to access instructions and care plan via MyChart confirmed with patient.     Barb Merino, RN, BSN, CCM Care Management Coordinator [facility] Care Management Direct Phone: [phone]

## 2021-09-30 NOTE — Progress Notes (Signed)
This encounter was created in error - please disregard.

## 2021-09-30 NOTE — Patient Outreach (Signed)
  Care Coordination   Follow Up Visit Note   09/30/2021 Name: [name] MRN: [id number] DOB: 19-Jun-1927  [name] is a 88 y.o. year old female who sees Shannon Chard, MD for primary care. I spoke with  [name] by phone today  What matters to the patients health and wellness today?  Patient needs assistance with options for level of care and home delivered meals.     Goals Addressed       Patient Stated     "My physical therapist did not show up this week" (pt-stated)        Care Coordination Interventions: Confirmed patient received a PT visit from [facility] patient has been discharged from these services, she feels she needs ongoing continued PT due to having decreased strength and endurance Educated patient about [facility] Determined patient would like to explore this option with her son and would like to complete a joint SW call when able to learn more about her level of care options SW referral sent - Collaborated with [name] BSW regarding patient's request for assistance with level of care options and home delivered meals     SDOH assessments and interventions completed:  Yes     Care Coordination Interventions Activated:  Yes  Care Coordination Interventions:  Yes, provided   Follow up plan: Referral made to [facility] Follow up call scheduled for 10/14/21 @2 :30 PM     Encounter Outcome:  Pt. Visit Completed

## 2021-10-01 ENCOUNTER — Ambulatory Visit: Payer: Self-pay

## 2021-10-01 NOTE — Patient Outreach (Signed)
  Care Coordination   Follow Up Visit Note   10/01/2021 Name: [name] MRN: [id number] DOB: 20-May-1927  [name] is a 88 y.o. year old female who sees Glendale Chard, MD for primary care. I spoke with  [place] by phone today  What matters to the patients health and wellness today?  To identify a caregiver to assist me at home while my son recovers from heart surgery    Goals Addressed             This Visit's Progress    Care Coordination Activities       Care Coordination Interventions: Telephonic visit completed with the patient and her son to review care needs Discussed the patients local son just returned home from bypass surgery and will be unable to provide care to the patient for the next 6-12 weeks. The patient currently receives mobile meals daily and is active with home health from [facility]. Patient reports she has both physical therapy and an aid. Unfortunately, the aid did not show up this week but [facility] is working on resolving this concern Education provided on [facility] reviewing care model and enrollment timeframe Determined this may not be the appropriate intervention at this time considering the patient needs assistance now and [facility] only enrolls once per month Reviewed the patients son is visiting from [place] to help but has to return home on Thursday 8/17; patient will need assistance with breakfast preparation and possibly bathing  Education on private duty caregivers provided to the family; family will outreach both [employer] and [facility] to obtain more information and determine if a good fit for the patient Discussed these agencies will complete an assessment to develop an individualized care plan outlining to the caregiver what the patient needs each day Encouraged to also seek out assistance from neighbors or church members who may be available to check on the patient each morning and assist with breakfast Advised the  patient to also speak with her physical therapist about working on strength training while preparing a sandwich to allow the patent to become more independent with meal preparation of simple meal options during this time Discussed the patients family will follow up with SW as needed Collaboration with [name] to advise of interventions and plan        SDOH assessments and interventions completed:  No     Care Coordination Interventions Activated:  Yes  Care Coordination Interventions:  Yes, provided   Follow up plan:  Patients son will plan to contact SW on Monday August 21 to review plan.    Encounter Outcome:  Pt. Visit Completed   Daneen Schick, BSW, CDP Social Worker, Certified Dementia Practitioner Care Coordination [phone]

## 2021-10-01 NOTE — Patient Instructions (Signed)
Visit Information  Thank you for taking time to visit with me today. Please don't hesitate to contact me if I can be of assistance to you.   Following are the goals we discussed today:   Goals Addressed             This Visit's Progress    Care Coordination Activities       Care Coordination Interventions: Telephonic visit completed with the patient and her son to review care needs Discussed the patients local son just returned home from bypass surgery and will be unable to provide care to the patient for the next 6-12 weeks. The patient currently receives mobile meals daily and is active with home health from [facility]. Patient reports she has both physical therapy and an aid. Unfortunately, the aid did not show up this week but [facility] is working on resolving this concern Education provided on [facility] reviewing care model and enrollment timeframe Determined this may not be the appropriate intervention at this time considering the patient needs assistance now and [facility] only enrolls once per month Reviewed the patients son is visiting from [place] to help but has to return home on Thursday 8/17; patient will need assistance with breakfast preparation and possibly bathing  Education on private duty caregivers provided to the family; family will outreach both [employer] and [place] to obtain more information and determine if a good fit for the patient Discussed these agencies will complete an assessment to develop an individualized care plan outlining to the caregiver what the patient needs each day Encouraged to also seek out assistance from neighbors or church members who may be available to check on the patient each morning and assist with breakfast Advised the patient to also speak with her physical therapist about working on strength training while preparing a sandwich to allow the patent to become more independent with meal preparation of simple meal options during  this time Discussed the patients family will follow up with SW as needed Collaboration with [place] to advise of interventions and plan         If you are experiencing a Mental Health or [place] or need someone to talk to, please call 1-800-273-TALK (toll free, 24 hour hotline)  Patient verbalizes understanding of instructions and care plan provided today and agrees to view in [place]. Active MyChart status and patient understanding of how to access instructions and care plan via MyChart confirmed with patient.     The patients son will contact SW on Monday August 21 or sooner as needed.  Daneen Schick, BSW, CDP Social Worker, Certified Dementia Practitioner Care Coordination [phone]

## 2021-10-09 ENCOUNTER — Ambulatory Visit: Payer: Self-pay

## 2021-10-09 NOTE — Patient Outreach (Signed)
  Care Coordination   Follow Up Visit Note   10/09/2021 Name: [name] MRN: [id number] DOB: [birth year]  [name] is a 88 y.o. year old female who sees Glendale Chard, MD for primary care. I  spoke with patients son [name] by phone.  What matters to the patients health and wellness today?  To keep her in her house as long as we can    Goals Addressed             This Visit's Progress    COMPLETED: Care Coordination Activities       Care Coordination Interventions: Spoke with patients son [name] to discuss goal progression Discussed the patients son has rearranged the home to make it safer for the patient and has replaced her bed Cameras have been installed in the home and patients son [name] is monitoring those remotely from [place] Although [place] cannot physically assist at this time he does live next door should an emergency arise Reviewed care options including family assistance, private pay, applying for Medicaid, and long term care Discussed patients goal is to remain at home as long as possible and her family is committed to helping her reach this goal as long as she is safe at home Education provided on purpose of home health and what might spark a future need for home health orders Encouraged [name] to contact the patients primary care provider as needed Collaboration with [name] to provide an update as well as to advise of SW plan to sign off at this time         SDOH assessments and interventions completed:  No     Care Coordination Interventions Activated:  Yes  Care Coordination Interventions:  Yes, provided   Follow up plan: No further intervention required.   Encounter Outcome:  Pt. Visit Completed   Daneen Schick, BSW, CDP Social Worker, Certified Dementia Practitioner Care Coordination [phone]

## 2021-10-09 NOTE — Patient Instructions (Signed)
Visit Information  Thank you for taking time to visit with me today. Please don't hesitate to contact me if I can be of assistance to you.   Following are the goals we discussed today:   Goals Addressed             This Visit's Progress    COMPLETED: Care Coordination Activities       Care Coordination Interventions: Spoke with patients son Huntley Dec to discuss goal progression Discussed the patients son has rearranged the home to make it safer for the patient and has replaced her bed Cameras have been installed in the home and patients son Jenny Reichmann is monitoring those remotely from [place] Although [place] cannot physically assist at this time he does live next door should an emergency arise Reviewed care options including family assistance, private pay, applying for Medicaid, and long term care Discussed patients goal is to remain at home as long as possible and her family is committed to helping her reach this goal as long as she is safe at home Education provided on purpose of home health and what might spark a future need for home health orders Encouraged [name] to contact the patients primary care provider as needed Collaboration with [place] to provide an update as well as to advise of SW plan to sign off at this time         Please call the care guide team at [phone] if you need to schedule an appointment with me  If you are experiencing a Mental Health or [place] or need someone to talk to, please call 1-800-273-TALK (toll free, 24 hour hotline)  Patient verbalizes understanding of instructions and care plan provided today and agrees to view in [place]. Active MyChart status and patient understanding of how to access instructions and care plan via MyChart confirmed with patient.     No further follow up required: Please contact your primary care provider as needed  Daneen Schick, BSW, CDP Social Worker, Certified Dementia  Practitioner Care Coordination [phone]

## 2021-10-09 NOTE — Patient Outreach (Signed)
  Care Coordination   Follow Up Visit Note   10/09/2021 Name: [name] MRN: [id number] DOB: [birth year]  [name] is a 88 y.o. year old female who sees Glendale Chard, MD for primary care. I spoke with  [place] by phone today  What matters to the patients health and wellness today?  To obtain a caregiver twice a week    Goals Addressed             This Visit's Progress    Care Coordination Activities       Care Coordination Interventions: Contacted the patent to determined her son Jenny Reichmann has returned to [place] and she has not received a caregiver in the home.  Discussed SW provided patients son with contacts to hire a caregiver in the home Patient requests SW contact her son to complete the visit Unsuccessful outbound call placed to Jenny Reichmann, voice message left requesting a return call Unsuccessful outbound call placed to [facility], unable to leave voice message due to mailbox being full        SDOH assessments and interventions completed:  No     Care Coordination Interventions Activated:  Yes  Care Coordination Interventions:  Yes, provided   Follow up plan:  SW will attempt to contact the patients son later in the week    Encounter Outcome:  Pt. Visit Completed   Daneen Schick, BSW, CDP Social Worker, Certified Dementia Practitioner Care Coordination [phone]

## 2021-10-10 ENCOUNTER — Other Ambulatory Visit (INDEPENDENT_AMBULATORY_CARE_PROVIDER_SITE_OTHER): Payer: Medicare Other | Admitting: Internal Medicine

## 2021-10-10 DIAGNOSIS — E7800 Pure hypercholesterolemia, unspecified: Secondary | ICD-10-CM

## 2021-10-10 DIAGNOSIS — I679 Cerebrovascular disease, unspecified: Secondary | ICD-10-CM

## 2021-10-10 DIAGNOSIS — I2510 Atherosclerotic heart disease of native coronary artery without angina pectoris: Secondary | ICD-10-CM

## 2021-10-10 DIAGNOSIS — E1122 Type 2 diabetes mellitus with diabetic chronic kidney disease: Secondary | ICD-10-CM

## 2021-10-10 DIAGNOSIS — M1611 Unilateral primary osteoarthritis, right hip: Secondary | ICD-10-CM

## 2021-10-10 DIAGNOSIS — M48062 Spinal stenosis, lumbar region with neurogenic claudication: Secondary | ICD-10-CM

## 2021-10-10 DIAGNOSIS — I739 Peripheral vascular disease, unspecified: Secondary | ICD-10-CM

## 2021-10-10 DIAGNOSIS — S32010D Wedge compression fracture of first lumbar vertebra, subsequent encounter for fracture with routine healing: Secondary | ICD-10-CM

## 2021-10-10 DIAGNOSIS — M5416 Radiculopathy, lumbar region: Secondary | ICD-10-CM

## 2021-10-10 DIAGNOSIS — I1310 Hypertensive heart and chronic kidney disease without heart failure, with stage 1 through stage 4 chronic kidney disease, or unspecified chronic kidney disease: Secondary | ICD-10-CM

## 2021-10-10 DIAGNOSIS — Z9181 History of falling: Secondary | ICD-10-CM

## 2021-10-10 DIAGNOSIS — N184 Chronic kidney disease, stage 4 (severe): Secondary | ICD-10-CM

## 2021-10-10 NOTE — Progress Notes (Signed)
[name]     Received home health orders orders from [facility]. Start of care 08/03/21.   Certification and orders from 10/02/21 through 11/30/21 are reviewed, signed and faxed back to home health company.  Need of intermittent skilled services at home: PT/OT/HHA  The home health care plan has been established by me and will be reviewed and updated as needed to maximize patient recovery.  I certify that all home health services have been and will be furnished to the patient while under my care.  Face-to-face encounter in which the need for home health services was established: 08/15/21  Patient is receiving home health services for the following diagnoses: Problem List Items Addressed This Visit       Cardiovascular and Mediastinum   CAD, NATIVE VESSEL   CEREBROVASCULAR DISEASE   Hypertensive heart and renal disease   Peripheral arterial disease (HCC)     Musculoskeletal and Integument   Arthritis of right hip   Other Visit Diagnoses     Closed wedge compression fracture of L1 vertebra with routine healing, subsequent encounter    -  Primary   Type 2 diabetes mellitus with stage 4 chronic kidney disease, without long-term current use of insulin ([place])       Spinal stenosis of lumbar region with neurogenic claudication       Lumbar radiculopathy       Pure hypercholesterolemia       History of fall            [name], MD

## 2021-10-12 ENCOUNTER — Other Ambulatory Visit: Payer: Self-pay | Admitting: Internal Medicine

## 2021-10-14 ENCOUNTER — Ambulatory Visit: Payer: Self-pay

## 2021-10-14 ENCOUNTER — Telehealth: Payer: Self-pay

## 2021-10-14 NOTE — Patient Instructions (Signed)
Visit Information  Thank you for taking time to visit with me today. Please don't hesitate to contact me if I can be of assistance to you.   Following are the goals we discussed today:   Goals Addressed     Patient Stated     "My physical therapist did not show up this week" (pt-stated)        Care Coordination Interventions: Determined [facility] has resumed services for patient including, Need of intermittent skilled services at home: PT/OT/HHA Discussed [place] will make an initial face to face visit with patient today to evaluate for transition of primary care Determined patient feels she will benefit from having a primary care provider who can make home visits to her as needed  Reviewed upcoming PCP visit with [name] scheduled for 10/16/21, patient is aware if she switches to [place], this visit will be cancelled      Our next appointment is by telephone on 10/15/21 at 09:45 AM   Please call the care guide team at [phone] if you need to cancel or reschedule your appointment.   If you are experiencing a Mental Health or [place] or need someone to talk to, please call 1-800-273-TALK (toll free, 24 hour hotline)  Patient verbalizes understanding of instructions and care plan provided today and agrees to view in [place]. Active MyChart status and patient understanding of how to access instructions and care plan via MyChart confirmed with patient.     Barb Merino, RN, BSN, CCM Care Management Coordinator [facility] Care Management Direct Phone: [phone]

## 2021-10-14 NOTE — Patient Outreach (Signed)
  Care Coordination   Follow Up Visit Note   10/14/2021 Name: [name] MRN: [id number] DOB: [birth year]  [name] is a 88 y.o. year old female who sees Shannon Chard, MD for primary care. I spoke with  [name] by phone today.  What matters to the patients health and wellness today?  Patient would like a primary care provider who can make house calls.     Goals Addressed       Patient Stated     "My physical therapist did not show up this week" (pt-stated)        Care Coordination Interventions: Determined [name] has resumed services for patient including, Need of intermittent skilled services at home: PT/OT/HHA Discussed [name] will make an initial face to face visit with patient today to evaluate for transition of primary care Determined patient feels she will benefit from having a primary care provider who can make home visits to her as needed  Reviewed upcoming PCP visit with [name] scheduled for 10/16/21, patient is aware if she switches to [name], this visit will be cancelled      SDOH assessments and interventions completed:  No     Care Coordination Interventions Activated:  Yes  Care Coordination Interventions:  Yes, provided   Follow up plan: Follow up call scheduled for 10/15/21 @09 :45 AM     Encounter Outcome:  Pt. Visit Completed

## 2021-10-14 NOTE — Telephone Encounter (Signed)
Leah PT with [facility] called requesting a physician order for social work to eval and treat. [phone]  I returned her call and gave verbal order okay. YL,RMA

## 2021-10-15 ENCOUNTER — Ambulatory Visit: Payer: Self-pay

## 2021-10-15 NOTE — Patient Outreach (Signed)
  Care Coordination   Follow Up Visit Note   10/15/2021 Name: [name] MRN: [id number] DOB: 1928/01/29  [name] is a 88 y.o. year old female who sees Glendale Chard, MD for primary care. I  spoke with Reche Dixon RN with [facility] today.   What matters to the patients health and wellness today?  Patient transitioned to [facility] for primary care.     Goals Addressed     Patient Stated     COMPLETED: "My physical therapist did not show up this week" (pt-stated)        Care Coordination Interventions: Determined patient is being following by [facility] and has transitioned to [place] for primary care        COMPLETED: "to get relief from my muscle spasms" (pt-stated)        Care Coordination Interventions: Determined patient is being following by [facility] and has transitioned to [place] for primary care      SDOH assessments and interventions completed:  No     Care Coordination Interventions Activated:  Yes  Care Coordination Interventions:  Yes, provided   Follow up plan: No further intervention required.   Encounter Outcome:  Pt. Visit Completed

## 2021-10-16 ENCOUNTER — Ambulatory Visit: Payer: Medicare Other | Admitting: Internal Medicine

## 2021-11-18 ENCOUNTER — Telehealth: Payer: Self-pay

## 2021-11-18 ENCOUNTER — Ambulatory Visit: Payer: Medicare Other | Admitting: Internal Medicine

## 2021-11-18 NOTE — Telephone Encounter (Signed)
Patient est with [facility]. She has a follow up appointment on 11/21/2021. Confirmed with B[place]y at remote [facility].

## 2021-11-27 ENCOUNTER — Encounter: Payer: Medicare HMO | Admitting: Internal Medicine

## 2021-12-31 ENCOUNTER — Ambulatory Visit: Payer: Medicare Other | Admitting: Podiatry

## 2022-01-15 ENCOUNTER — Other Ambulatory Visit: Payer: Self-pay | Admitting: Internal Medicine

## 2022-01-15 DIAGNOSIS — I1310 Hypertensive heart and chronic kidney disease without heart failure, with stage 1 through stage 4 chronic kidney disease, or unspecified chronic kidney disease: Secondary | ICD-10-CM

## 2022-01-15 DIAGNOSIS — N183 Chronic kidney disease, stage 3 unspecified: Secondary | ICD-10-CM

## 2022-01-21 ENCOUNTER — Telehealth: Payer: Self-pay | Admitting: *Deleted

## 2022-01-21 NOTE — Telephone Encounter (Signed)
Tried to call the pt but line was busy

## 2022-01-21 NOTE — Telephone Encounter (Signed)
   Pre-operative Risk Assessment    Patient Name: [name]  DOB: 08-28-1927 MRN: [id number]      Request for Surgical Clearance    Procedure:   RIGHT TOTAL HIP ARTHROPLASTY  Date of Surgery:  Clearance TBD                                 Surgeon:  DR. Rod Can Surgeon's Group or Practice Name:  [facility] Phone number:  [phone] ATTN: [place] Fax number:  [phone]   Type of Clearance Requested:   - Medical ; NO MEDICATIONS LISTED AS NEEDING TO BE HELD   Type of Anesthesia:  Spinal   Additional requests/questions:    Jiles Prows   01/21/2022, 12:31 PM

## 2022-01-21 NOTE — Telephone Encounter (Signed)
   Name: [name]  DOB: 06-Nov-1927  MRN: [id number]  Primary Cardiologist: Jenkins Rouge, MD  Chart reviewed as part of pre-operative protocol coverage. Because of [name]'s past medical history and time since last visit, she will require a follow-up in-office visit in order to better assess preoperative cardiovascular risk. Last OV 04/2021 - given advanced age (89), prior significant cardiac hx and CKD, needs in person visit for risk stratification.  Pre-op covering staff: - Please schedule appointment and call patient to inform them. If patient already had an upcoming appointment within acceptable timeframe, please add "pre-op clearance" to the appointment notes so provider is aware. - Please contact requesting surgeon's office via preferred method (i.e, phone, fax) to inform them of need for appointment prior to surgery.  No medications listed as needing to be held but is on Aggrenox per [facility] prescribed by PCP therefore would recommend she get clearance from primary care to hold as well.  Charlie Pitter, PA-C  01/21/2022, 1:35 PM

## 2022-01-22 NOTE — Telephone Encounter (Signed)
I s/w the pt and she is agreeable to plan of care for in office appt for pre op clearance. Pt has been scheduled to see Dr. Johnsie Cancel 01/27/22 @ 9:45. Pt is grateful for our help in this matter. I will send FYI to surgeon office pt has appt.

## 2022-01-23 NOTE — Progress Notes (Signed)
Patient ID: [name], female   DOB: [birth year], 88 y.o.   MRN: [id number]     Cardiology Office Note   Date:  01/27/2022   ID:  [name], DOB 12/23/1927, MRN [id number]  PCP:  Glendale Chard, MD  Cardiologist:   Jenkins Rouge, MD   No chief complaint on file.     History of Present Illness: [name] is a 88 y.o. female who presents for f/u  of CAD. Distant history of CABG complicated by CVA.  Last cath 06/2010 with patent grafts diffuse distal LAD disease   SVG OM/D1 sequential SVG PDA LIMA to LAD  EF normal at that time    CRF;s HTN and elevated lipids on Rx  Has been on Aggrenox since stroke 09/08/13 Carotid 12/08/17  plaque no stenosis  Living independently  Ambulates with cain.  DM under good control   No longer driving . Living independently has one son living in [place] and one in [place]   No angina mild edema in RLE   Baseline Cr 1.87   Son lives next door and looks in on her  She has a little terrier mix that's good company for her   Significant right hip arthritis Seen in ED 04/09/21 Has had injections in both hip and knee Has not had visco-supplementation injection Had oral steroid taper Contemplating THR with Dr Lyla Glassing   She has been mostly wheel chair bound due to pain since about August She cannot live like this and is willing to do surgery no matter what the possible complications No angina has not had any recent hospitalizations for angina/CHF or arrhythmia Primary co morbidities are age and CRF with most recent Cr 2.44 Patient indicates pain and limitations from hip impacting quality of life and wants to have surgery    Past Medical History:  Diagnosis Date   Cerebrovascular disease, unspecified    Coronary atherosclerosis of unspecified type of vessel, native or graft    Diabetes mellitus without complication ([place])    Other and unspecified hyperlipidemia    Rheumatoid arthritis(714.0)    Shingles 2003   Unspecified essential  hypertension     Past Surgical History:  Procedure Laterality Date   Cardiac Bypass  2003   CATARACT EXTRACTION, BILATERAL  2007     Current Outpatient Medications  Medication Sig Dispense Refill   Accu-Chek Softclix Lancets lancets Use as instructed to check blood sugars up to 3 times a day  Dx code e11.65 100 each 12   acetaminophen (TYLENOL) 650 MG CR tablet Take 650 mg by mouth every 8 (eight) hours as needed for pain.     amLODipine (NORVASC) 5 MG tablet TAKE 1 TABLET(5 MG) BY MOUTH DAILY 90 tablet 2   Blood Glucose Calibration (ACCU-CHEK AVIVA) SOLN Use with accu-check aviva machine 1 each 3   Blood Glucose Monitoring Suppl (ACCU-CHEK AVIVA PLUS) w/Device KIT Use to check  Blood sugars up to 3 times a day.  Dx code e11.65 1 kit 3   carvedilol (COREG) 12.5 MG tablet TAKE 1 TABLET(12.5 MG) BY MOUTH TWICE DAILY WITH A MEAL 180 tablet 1   carvedilol (COREG) 12.5 MG tablet TAKE 1 TABLET(12.5 MG) BY MOUTH TWICE DAILY WITH A MEAL 180 tablet 1   chlorthalidone (HYGROTON) 25 MG tablet Take 12.5 mg by mouth daily.     Cholecalciferol (VITAMIN D) 50 MCG (2000 UT) CAPS Take by mouth.     clindamycin (CLEOCIN) 150 MG capsule For dentist appointments     dipyridamole-aspirin (  AGGRENOX) 200-25 MG 12hr capsule TAKE 1 CAPSULE BY MOUTH DAILY 180 capsule 1   glimepiride (AMARYL) 1 MG tablet TAKE 1/2 TABLET BY MOUTH ON MONDAYS, WEDNESDAYS, AND FRIDAYS 36 tablet 1   glucosamine-chondroitin 500-400 MG tablet Take 1 tablet by mouth 3 (three) times daily.     glucose blood test strip Use as instructed to check blood sugars up to 3 times a day.  Dx code e11.65 100 each 12   JARDIANCE 10 MG TABS tablet Take 10 mg by mouth daily.     Multiple Vitamin (MULTIVITAMIN) capsule Take 1 capsule by mouth daily.     nitroGLYCERIN (NITROSTAT) 0.4 MG SL tablet PLACE 1 TABLET UNDER THE TONGUE AT THE FIRST SIGN OF ATTACK, MAY REPEAT EVERY 5 MINUTES FOR 3 DOSES IN 15 MINUTES. IF PAIN PERSISTS CALL 911 50 tablet 0    rosuvastatin (CRESTOR) 5 MG tablet TAKE ONE TABLET BY MOUTH EVERY DAY ON MONDAY THROUGH FRIDAY.. DO NOT TAKE ON SATURDAY AND SUNDAY 90 tablet 1   irbesartan (AVAPRO) 150 MG tablet Take 1 tablet (150 mg total) by mouth daily. 90 tablet 2   predniSONE (STERAPRED UNI-PAK 21 TAB) 10 MG (21) TBPK tablet Take as directed (Patient not taking: Reported on 01/27/2022) 21 tablet 3   tiZANidine (ZANAFLEX) 4 MG tablet One tab po qpm prn muscle spasms (Patient not taking: Reported on 01/27/2022) 20 tablet 0   No current facility-administered medications for this visit.    Allergies:   Patient has no known allergies.    Social History:  The patient  reports that she has never smoked. She has never used smokeless tobacco. She reports that she does not drink alcohol and does not use drugs.   Family History:  The patient's family history includes Cancer in her brother and mother; Heart disease in her brother; Hypertension in her father and paternal grandmother.    ROS:  Please see the history of present illness.   Otherwise, review of systems are positive for none.   All other systems are reviewed and negative.    PHYSICAL EXAM: VS:  BP 126/68   Pulse 64   Ht _0  (1.626 m)   Wt 114 lb (51.7 kg)   SpO2 99%   BMI 19.57 kg/m  , BMI Body mass index is 19.57 kg/m.  Affect appropriate Healthy:  appears stated age 20: normal Neck supple with no adenopathy JVP normal no bruits no thyromegaly Lungs clear with no wheezing and good diaphragmatic motion Heart:  S1/S2 no murmur, no rub, gallop or click PMI normal Abdomen: benighn, BS positve, no tenderness, no AAA no bruit.  No HSM or HJR Distal pulses intact with no bruits No edema Neuro non-focal Skin warm and dry No muscular weakness Plus one RLE edema     EKG:   01/27/2022 SR nonspecific ST changes rate 64   Recent Labs: 04/05/2021: Hemoglobin 11.1; Platelets 195 07/17/2021: ALT 9; BUN 51; Creatinine, Ser 2.44; Potassium 4.9; Sodium 143     Lipid Panel    Component Value Date/Time   CHOL 129 07/19/2020 1116   TRIG 120 07/19/2020 1116   HDL 31 (L) 07/19/2020 1116   CHOLHDL 4.2 07/19/2020 1116   LDLCALC 76 07/19/2020 1116      Wt Readings from Last 3 Encounters:  01/27/22 114 lb (51.7 kg)  07/17/21 134 lb (60.8 kg)  07/05/21 138 lb (62.6 kg)      Other studies Reviewed: Additional studies/ records that were reviewed today include: Epic  notes and old CABG report . Labs 11/07/19    ASSESSMENT AND PLAN:  1. CAD/CABG: CABG 2003 with patent grafts on cath 2012 no angina continue medical Rx 2. Carotid:  Duplex 12/08/17  plaque no stenosis observe given age  36. HTN:.  Well controlled.  Continue current medications and low sodium Dash type diet.   4. DM:  Discussed low carb diet.  Target hemoglobin A1c is 6.5 or less.  Continue current medications. 5. Chol: continue crestor LDL 76 reasonable given age  19. Edema:  RLE from SVG harvest chronic Has diuretic continue hygroton 7. CRF:  Cr around  2.44  do not escalate diuretic f/u nephrology  8. Preoperative:  ok to have surgery for right hip Co morbidities primarily age and CRF. She is willing to take risk given pain and poor mobility with failed conservative Rx. May have prolonged rehab If she has any significant blood loss or hypotension renal function may worsen She understands she will not go straight home after surgery Her son will take care of her dog Tippy   Current medicines are reviewed at length with the patient today.  The patient does not have concerns regarding medicines.  The following changes have been made:  no change  Labs/ tests ordered today include:   None     No orders of the defined types were placed in this encounter.    Disposition:   FU with me in a year      Signed, Jenkins Rouge, MD  01/27/2022 9:55 AM    [address], [place], [place]  [address] Phone: [phone]; Fax: [phone]

## 2022-01-25 ENCOUNTER — Other Ambulatory Visit (INDEPENDENT_AMBULATORY_CARE_PROVIDER_SITE_OTHER): Payer: Medicare Other | Admitting: Internal Medicine

## 2022-01-25 DIAGNOSIS — M069 Rheumatoid arthritis, unspecified: Secondary | ICD-10-CM

## 2022-01-25 DIAGNOSIS — M5416 Radiculopathy, lumbar region: Secondary | ICD-10-CM

## 2022-01-25 DIAGNOSIS — M1611 Unilateral primary osteoarthritis, right hip: Secondary | ICD-10-CM | POA: Diagnosis not present

## 2022-01-25 DIAGNOSIS — I25118 Atherosclerotic heart disease of native coronary artery with other forms of angina pectoris: Secondary | ICD-10-CM | POA: Diagnosis not present

## 2022-01-25 DIAGNOSIS — N184 Chronic kidney disease, stage 4 (severe): Secondary | ICD-10-CM

## 2022-01-25 DIAGNOSIS — Z9181 History of falling: Secondary | ICD-10-CM

## 2022-01-25 DIAGNOSIS — I1310 Hypertensive heart and chronic kidney disease without heart failure, with stage 1 through stage 4 chronic kidney disease, or unspecified chronic kidney disease: Secondary | ICD-10-CM | POA: Diagnosis not present

## 2022-01-25 DIAGNOSIS — E1122 Type 2 diabetes mellitus with diabetic chronic kidney disease: Secondary | ICD-10-CM

## 2022-01-25 DIAGNOSIS — S32010D Wedge compression fracture of first lumbar vertebra, subsequent encounter for fracture with routine healing: Secondary | ICD-10-CM

## 2022-01-25 DIAGNOSIS — M48062 Spinal stenosis, lumbar region with neurogenic claudication: Secondary | ICD-10-CM

## 2022-01-25 NOTE — Progress Notes (Signed)
CC: Home health recertification  Received home health orders orders from [facility]. Start of care 08/03/21.   Certification and orders from 12/01/21 through 01/29/22 are reviewed, signed and faxed back to home health company.  Need of intermittent skilled services at home: PT, HHA  The home health care plan has been established by me and will be reviewed and updated as needed to maximize patient recovery.  I certify that all home health services have been and will be furnished to the patient while under my care.  Face-to-face encounter in which the need for home health services was established: 07/17/21  Patient is receiving home health services for the following diagnoses: Problem List Items Addressed This Visit       Cardiovascular and Mediastinum   Hypertensive heart and renal disease     Musculoskeletal and Integument   Rheumatoid arthritis ([place])   Arthritis of right hip   Other Visit Diagnoses     Closed wedge compression fracture of L1 vertebra with routine healing, subsequent encounter    -  Primary   Spinal stenosis of lumbar region with neurogenic claudication       Lumbar radiculopathy       History of fall       Diabetes mellitus with stage 4 chronic kidney disease ([place])       Chronic renal disease, stage IV (HCC)       Athscl heart disease of native cor art w oth ang pctrs (HCC)            [name], MD

## 2022-01-27 ENCOUNTER — Encounter: Payer: Self-pay | Admitting: Cardiovascular Disease

## 2022-01-27 ENCOUNTER — Ambulatory Visit: Payer: Medicare Other | Attending: Cardiovascular Disease | Admitting: Cardiovascular Disease

## 2022-01-27 VITALS — BP 126/68 | HR 64 | Ht 64.0 in | Wt 114.0 lb

## 2022-01-27 DIAGNOSIS — E782 Mixed hyperlipidemia: Secondary | ICD-10-CM | POA: Diagnosis not present

## 2022-01-27 DIAGNOSIS — Z951 Presence of aortocoronary bypass graft: Secondary | ICD-10-CM | POA: Insufficient documentation

## 2022-01-27 DIAGNOSIS — I10 Essential (primary) hypertension: Secondary | ICD-10-CM | POA: Insufficient documentation

## 2022-01-27 DIAGNOSIS — Z01810 Encounter for preprocedural cardiovascular examination: Secondary | ICD-10-CM | POA: Diagnosis not present

## 2022-01-27 DIAGNOSIS — I25118 Atherosclerotic heart disease of native coronary artery with other forms of angina pectoris: Secondary | ICD-10-CM

## 2022-01-27 NOTE — Patient Instructions (Signed)
Medication Instructions:  Your physician recommends that you continue on your current medications as directed. Please refer to the Current Medication list given to you today.  *If you need a refill on your cardiac medications before your next appointment, please call your pharmacy*  Lab Work: If you have labs (blood work) drawn today and your tests are completely normal, you will receive your results only by: MyChart Message (if you have MyChart) OR A paper copy in the mail If you have any lab test that is abnormal or we need to change your treatment, we will call you to review the results.  Testing/Procedures: None ordered today.  Follow-Up: At [facility], you and your health needs are our priority.  As part of our continuing mission to provide you with exceptional heart care, we have created designated Provider Care Teams.  These Care Teams include your primary Cardiologist (physician) and Advanced Practice Providers (APPs -  Physician Assistants and Nurse Practitioners) who all work together to provide you with the care you need, when you need it.  We recommend signing up for the patient portal called "MyChart".  Sign up information is provided on this After Visit Summary.  MyChart is used to connect with patients for Virtual Visits (Telemedicine).  Patients are able to view lab/test results, encounter notes, upcoming appointments, etc.  Non-urgent messages can be sent to your provider as well.   To learn more about what you can do with MyChart, go to [email or web address].    Your next appointment:   1 year(s)  The format for your next appointment:   In Person  Provider:   Peter Nishan, MD     Important Information About Sugar       

## 2022-02-13 ENCOUNTER — Other Ambulatory Visit: Payer: Self-pay | Admitting: Internal Medicine

## 2022-02-13 DIAGNOSIS — I2510 Atherosclerotic heart disease of native coronary artery without angina pectoris: Secondary | ICD-10-CM

## 2022-02-21 ENCOUNTER — Other Ambulatory Visit: Payer: Medicare Other

## 2022-02-25 NOTE — Telephone Encounter (Signed)
Chmg-error.  

## 2022-03-21 ENCOUNTER — Other Ambulatory Visit: Payer: Self-pay | Admitting: Internal Medicine

## 2022-03-21 DIAGNOSIS — I2510 Atherosclerotic heart disease of native coronary artery without angina pectoris: Secondary | ICD-10-CM

## 2022-03-26 DIAGNOSIS — I252 Old myocardial infarction: Secondary | ICD-10-CM | POA: Diagnosis not present

## 2022-03-26 DIAGNOSIS — M069 Rheumatoid arthritis, unspecified: Secondary | ICD-10-CM | POA: Diagnosis not present

## 2022-03-26 DIAGNOSIS — R32 Unspecified urinary incontinence: Secondary | ICD-10-CM | POA: Diagnosis not present

## 2022-03-26 DIAGNOSIS — E1151 Type 2 diabetes mellitus with diabetic peripheral angiopathy without gangrene: Secondary | ICD-10-CM | POA: Diagnosis not present

## 2022-03-26 DIAGNOSIS — M1990 Unspecified osteoarthritis, unspecified site: Secondary | ICD-10-CM | POA: Diagnosis not present

## 2022-03-26 DIAGNOSIS — E1122 Type 2 diabetes mellitus with diabetic chronic kidney disease: Secondary | ICD-10-CM | POA: Diagnosis not present

## 2022-03-26 DIAGNOSIS — I2510 Atherosclerotic heart disease of native coronary artery without angina pectoris: Secondary | ICD-10-CM | POA: Diagnosis not present

## 2022-03-26 DIAGNOSIS — G3184 Mild cognitive impairment, so stated: Secondary | ICD-10-CM | POA: Diagnosis not present

## 2022-03-26 DIAGNOSIS — N189 Chronic kidney disease, unspecified: Secondary | ICD-10-CM | POA: Diagnosis not present

## 2022-03-27 ENCOUNTER — Ambulatory Visit: Payer: Medicare PPO | Admitting: Internal Medicine

## 2022-03-27 ENCOUNTER — Ambulatory Visit (INDEPENDENT_AMBULATORY_CARE_PROVIDER_SITE_OTHER): Payer: Medicare PPO

## 2022-03-27 ENCOUNTER — Telehealth: Payer: Self-pay

## 2022-03-27 VITALS — Ht 64.0 in | Wt 114.0 lb

## 2022-03-27 DIAGNOSIS — Z0000 Encounter for general adult medical examination without abnormal findings: Secondary | ICD-10-CM

## 2022-03-27 NOTE — Patient Instructions (Signed)
Ms. [name] , Thank you for taking time to come for your Medicare Wellness Visit. I appreciate your ongoing commitment to your health goals. Please review the following plan we discussed and let me know if I can assist you in the future.   These are the goals we discussed:  Goals      Exercise 150 min/wk Moderate Activity     09/16/2018, wants to exercise more     Patient Stated     11/09/2019, wants to walk without cane or walker     Patient Stated     03/08/2020, wants to be able to walk better     Patient Stated     03/14/2021, wants to walk better     Patient Stated     03/27/2022, wants to be able to stand up        This is a list of the screening recommended for you and due dates:  Health Maintenance  Topic Date Due   DTaP/Tdap/Td vaccine (1 - Tdap) Never done   Flu Shot  09/17/2021   COVID-19 Vaccine (5 - 2023-24 season) 10/18/2021   Complete foot exam   12/06/2021   Hemoglobin A1C  01/16/2022   Eye exam for diabetics  02/13/2022   Mammogram  04/04/2022   Medicare Annual Wellness Visit  03/28/2023   Pneumonia Vaccine  Completed   DEXA scan (bone density measurement)  Completed   Zoster (Shingles) Vaccine  Completed   HPV Vaccine  Aged Out    Advanced directives: Advance directive discussed with you today.   Conditions/risks identified: home care is inconsistent.  Next appointment: Follow up in one year for your annual wellness visit    Preventive Care 65 Years and Older, Female Preventive care refers to lifestyle choices and visits with your health care provider that can promote health and wellness. What does preventive care include? A yearly physical exam. This is also called an annual well check. Dental exams once or twice a year. Routine eye exams. Ask your health care provider how often you should have your eyes checked. Personal lifestyle choices, including: Daily care of your teeth and gums. Regular physical activity. Eating a healthy diet. Avoiding  tobacco and drug use. Limiting alcohol use. Practicing safe sex. Taking low-dose aspirin every day. Taking vitamin and mineral supplements as recommended by your health care provider. What happens during an annual well check? The services and screenings done by your health care provider during your annual well check will depend on your age, overall health, lifestyle risk factors, and family history of disease. Counseling  Your health care provider may ask you questions about your: Alcohol use. Tobacco use. Drug use. Emotional well-being. Home and relationship well-being. Sexual activity. Eating habits. History of falls. Memory and ability to understand (cognition). Work and work Statistician. Reproductive health. Screening  You may have the following tests or measurements: Height, weight, and BMI. Blood pressure. Lipid and cholesterol levels. These may be checked every 5 years, or more frequently if you are over 44 years old. Skin check. Lung cancer screening. You may have this screening every year starting at age 70 if you have a 30-pack-year history of smoking and currently smoke or have quit within the past 15 years. Fecal occult blood test (FOBT) of the stool. You may have this test every year starting at age 43. Flexible sigmoidoscopy or colonoscopy. You may have a sigmoidoscopy every 5 years or a colonoscopy every 10 years starting at age 25. Hepatitis C blood  test. Hepatitis B blood test. Sexually transmitted disease (STD) testing. Diabetes screening. This is done by checking your blood sugar (glucose) after you have not eaten for a while (fasting). You may have this done every 1-3 years. Bone density scan. This is done to screen for osteoporosis. You may have this done starting at age 90. Mammogram. This may be done every 1-2 years. Talk to your health care provider about how often you should have regular mammograms. Talk with your health care provider about your test  results, treatment options, and if necessary, the need for more tests. Vaccines  Your health care provider may recommend certain vaccines, such as: Influenza vaccine. This is recommended every year. Tetanus, diphtheria, and acellular pertussis (Tdap, Td) vaccine. You may need a Td booster every 10 years. Zoster vaccine. You may need this after age 49. Pneumococcal 13-valent conjugate (PCV13) vaccine. One dose is recommended after age 64. Pneumococcal polysaccharide (PPSV23) vaccine. One dose is recommended after age 19. Talk to your health care provider about which screenings and vaccines you need and how often you need them. This information is not intended to replace advice given to you by your health care provider. Make sure you discuss any questions you have with your health care provider. Document Released: 03/02/2015 Document Revised: 10/24/2015 Document Reviewed: 12/05/2014 Elsevier Interactive Patient Education  2017 [place] Prevention in the Home Falls can cause injuries. They can happen to people of all ages. There are many things you can do to make your home safe and to help prevent falls. What can I do on the outside of my home? Regularly fix the edges of walkways and driveways and fix any cracks. Remove anything that might make you trip as you walk through a door, such as a raised step or threshold. Trim any bushes or trees on the path to your home. Use bright outdoor lighting. Clear any walking paths of anything that might make someone trip, such as rocks or tools. Regularly check to see if handrails are loose or broken. Make sure that both sides of any steps have handrails. Any raised decks and porches should have guardrails on the edges. Have any leaves, snow, or ice cleared regularly. Use sand or salt on walking paths during winter. Clean up any spills in your garage right away. This includes oil or grease spills. What can I do in the bathroom? Use night  lights. Install grab bars by the toilet and in the tub and shower. Do not use towel bars as grab bars. Use non-skid mats or decals in the tub or shower. If you need to sit down in the shower, use a plastic, non-slip stool. Keep the floor dry. Clean up any water that spills on the floor as soon as it happens. Remove soap buildup in the tub or shower regularly. Attach bath mats securely with double-sided non-slip rug tape. Do not have throw rugs and other things on the floor that can make you trip. What can I do in the bedroom? Use night lights. Make sure that you have a light by your bed that is easy to reach. Do not use any sheets or blankets that are too big for your bed. They should not hang down onto the floor. Have a firm chair that has side arms. You can use this for support while you get dressed. Do not have throw rugs and other things on the floor that can make you trip. What can I do in the kitchen? Clean  up any spills right away. Avoid walking on wet floors. Keep items that you use a lot in easy-to-reach places. If you need to reach something above you, use a strong step stool that has a grab bar. Keep electrical cords out of the way. Do not use floor polish or wax that makes floors slippery. If you must use wax, use non-skid floor wax. Do not have throw rugs and other things on the floor that can make you trip. What can I do with my stairs? Do not leave any items on the stairs. Make sure that there are handrails on both sides of the stairs and use them. Fix handrails that are broken or loose. Make sure that handrails are as long as the stairways. Check any carpeting to make sure that it is firmly attached to the stairs. Fix any carpet that is loose or worn. Avoid having throw rugs at the top or bottom of the stairs. If you do have throw rugs, attach them to the floor with carpet tape. Make sure that you have a light switch at the top of the stairs and the bottom of the stairs. If  you do not have them, ask someone to add them for you. What else can I do to help prevent falls? Wear shoes that: Do not have high heels. Have rubber bottoms. Are comfortable and fit you well. Are closed at the toe. Do not wear sandals. If you use a stepladder: Make sure that it is fully opened. Do not climb a closed stepladder. Make sure that both sides of the stepladder are locked into place. Ask someone to hold it for you, if possible. Clearly mark and make sure that you can see: Any grab bars or handrails. First and last steps. Where the edge of each step is. Use tools that help you move around (mobility aids) if they are needed. These include: Canes. Walkers. Scooters. Crutches. Turn on the lights when you go into a dark area. Replace any light bulbs as soon as they burn out. Set up your furniture so you have a clear path. Avoid moving your furniture around. If any of your floors are uneven, fix them. If there are any pets around you, be aware of where they are. Review your medicines with your doctor. Some medicines can make you feel dizzy. This can increase your chance of falling. Ask your doctor what other things that you can do to help prevent falls. This information is not intended to replace advice given to you by your health care provider. Make sure you discuss any questions you have with your health care provider. Document Released: 11/30/2008 Document Revised: 07/12/2015 Document Reviewed: 03/10/2014 Elsevier Interactive Patient Education  2017 [employer].

## 2022-03-27 NOTE — Progress Notes (Signed)
I connected with [place] today by telephone and verified that I am speaking with the correct person using two identifiers. Location patient: home Location provider: work Persons participating in the virtual visit: [place], Glenna Durand LPN.   I discussed the limitations, risks, security and privacy concerns of performing an evaluation and management service by telephone and the availability of in person appointments. I also discussed with the patient that there may be a patient responsible charge related to this service. The patient expressed understanding and verbally consented to this telephonic visit.    Interactive audio and video telecommunications were attempted between this provider and patient, however failed, due to patient having technical difficulties OR patient did not have access to video capability.  We continued and completed visit with audio only.     Vital signs may be patient reported or missing.  Subjective:   [name] is a 89 y.o. female who presents for Medicare Annual (Subsequent) preventive examination.  Review of Systems     Cardiac Risk Factors include: advanced age (>51men, >38 women);diabetes mellitus;dyslipidemia;hypertension     Objective:    Today's Vitals   03/27/22 1403  Weight: 114 lb (51.7 kg)  Height: 5\' 4"  (1.626 m)   Body mass index is 19.57 kg/m.     03/27/2022    2:11 PM 03/14/2021    2:18 PM 03/08/2020    2:18 PM 11/09/2019    9:24 AM 09/16/2018   10:12 AM  Advanced Directives  Does Patient Have a Medical Advance Directive? No Yes Yes No Yes  Type of Corporate treasurer of [place];Living will [place];Living will  [place];Living will  Copy of [place] in Chart?  No - copy requested No - copy requested  No - copy requested    Current Medications (verified) Outpatient Encounter Medications as of 03/27/2022  Medication Sig   Accu-Chek  Softclix Lancets lancets Use as instructed to check blood sugars up to 3 times a day  Dx code e11.65   acetaminophen (TYLENOL) 650 MG CR tablet Take 650 mg by mouth every 8 (eight) hours as needed for pain.   amLODipine (NORVASC) 5 MG tablet TAKE 1 TABLET(5 MG) BY MOUTH DAILY   Blood Glucose Calibration (ACCU-CHEK AVIVA) SOLN Use with accu-check aviva machine   Blood Glucose Monitoring Suppl (ACCU-CHEK AVIVA PLUS) w/Device KIT Use to check  Blood sugars up to 3 times a day.  Dx code e11.65   carvedilol (COREG) 12.5 MG tablet TAKE 1 TABLET(12.5 MG) BY MOUTH TWICE DAILY WITH A MEAL   carvedilol (COREG) 12.5 MG tablet TAKE 1 TABLET(12.5 MG) BY MOUTH TWICE DAILY WITH A MEAL   chlorthalidone (HYGROTON) 25 MG tablet Take 12.5 mg by mouth daily.   Cholecalciferol (VITAMIN D) 50 MCG (2000 UT) CAPS Take by mouth.   clindamycin (CLEOCIN) 150 MG capsule For dentist appointments   dipyridamole-aspirin (AGGRENOX) 200-25 MG 12hr capsule TAKE 1 CAPSULE BY MOUTH DAILY   glucosamine-chondroitin 500-400 MG tablet Take 1 tablet by mouth 3 (three) times daily.   glucose blood test strip Use as instructed to check blood sugars up to 3 times a day.  Dx code e11.65   JARDIANCE 10 MG TABS tablet Take 10 mg by mouth daily.   Multiple Vitamin (MULTIVITAMIN) capsule Take 1 capsule by mouth daily.   nitroGLYCERIN (NITROSTAT) 0.4 MG SL tablet PLACE 1 TABLET UNDER THE TONGUE AT THE FIRST SIGN OF ATTACK, MAY REPEAT EVERY 5 MINUTES  FOR 3 DOSES IN 15 MINUTES. IF PAIN PERSISTS CALL 911   glimepiride (AMARYL) 1 MG tablet TAKE 1/2 TABLET BY MOUTH ON MONDAYS, WEDNESDAYS, AND FRIDAYS (Patient not taking: Reported on 03/27/2022)   irbesartan (AVAPRO) 150 MG tablet Take 1 tablet (150 mg total) by mouth daily.   predniSONE (STERAPRED UNI-PAK 21 TAB) 10 MG (21) TBPK tablet Take as directed (Patient not taking: Reported on 01/27/2022)   rosuvastatin (CRESTOR) 5 MG tablet TAKE 1 TABLET BY MOUTH EVERY DAY MONDAY THROUGH FRIDAY. DO NOT  TAKE ON SATURDAY AND SUNDAY (Patient not taking: Reported on 03/27/2022)   tiZANidine (ZANAFLEX) 4 MG tablet One tab po qpm prn muscle spasms (Patient not taking: Reported on 01/27/2022)   No facility-administered encounter medications on file as of 03/27/2022.    Allergies (verified) Patient has no known allergies.   History: Past Medical History:  Diagnosis Date   Cerebrovascular disease, unspecified    Coronary atherosclerosis of unspecified type of vessel, native or graft    Diabetes mellitus without complication ([place])    Other and unspecified hyperlipidemia    Rheumatoid arthritis(714.0)    Shingles 2003   Unspecified essential hypertension    Past Surgical History:  Procedure Laterality Date   Cardiac Bypass  2003   CATARACT EXTRACTION, BILATERAL  2007   Family History  Problem Relation Age of Onset   Cancer Mother    Hypertension Father    Cancer Brother    Hypertension Paternal Grandmother    Heart disease Brother    Diabetes Neg Hx    Coronary artery disease Neg Hx    Social History   Socioeconomic History   Marital status: Widowed    Spouse name: Not on file   Number of children: Not on file   Years of education: Not on file   Highest education level: Not on file  Occupational History   Occupation: retired  Tobacco Use   Smoking status: Never   Smokeless tobacco: Never  Vaping Use   Vaping Use: Never used  Substance and Sexual Activity   Alcohol use: No   Drug use: No   Sexual activity: Not Currently  Other Topics Concern   Not on file  Social History Narrative   Retired   Tobacco use- no   Alcohol use-no         Social Determinants of Health   Financial Resource Strain: Low Risk  (03/27/2022)   Overall Financial Resource Strain (CARDIA)    Difficulty of Paying Living Expenses: Not hard at all  Food Insecurity: No Food Insecurity (03/27/2022)   Hunger Vital Sign    Worried About Running Out of Food in the Last Year: Never true    [place] in the Last Year: Never true  Transportation Needs: No Transportation Needs (03/27/2022)   PRAPARE - Hydrologist (Medical): No    Lack of Transportation (Non-Medical): No  Physical Activity: Inactive (03/27/2022)   Exercise Vital Sign    Days of Exercise per Week: 0 days    Minutes of Exercise per Session: 0 min  Stress: No Stress Concern Present (03/27/2022)   [place]    Feeling of Stress : Not at all  Social Connections: Moderately Integrated (11/14/2020)   Social Connection and Isolation Panel [NHANES]    Frequency of Communication with Friends and Family: More than three times a week    Frequency of Social Gatherings with Friends  and Family: Once a week    Attends Religious Services: More than 4 times per year    Active Member of Clubs or Organizations: Yes    Attends Archivist Meetings: Never    Marital Status: Widowed    Tobacco Counseling Counseling given: Not Answered   Clinical Intake:  Pre-visit preparation completed: Yes  Pain : No/denies pain     Nutritional Status: BMI of 19-24  Normal Nutritional Risks: None Diabetes: Yes  How often do you need to have someone help you when you read instructions, pamphlets, or other written materials from your doctor or pharmacy?: 1 - Never  Diabetic? Yes Nutrition Risk Assessment:  Has the patient had any N/V/D within the last 2 months?  No  Does the patient have any non-healing wounds?  No  Has the patient had any unintentional weight loss or weight gain?  Yes   Diabetes:  Is the patient diabetic?  Yes  If diabetic, was a CBG obtained today?  No  Did the patient bring in their glucometer from home?  No  How often do you monitor your CBG's? daily.   Financial Strains and Diabetes Management:  Are you having any financial strains with the device, your supplies or your medication? No .  Does the patient want  to be seen by Chronic Care Management for management of their diabetes?  No  Would the patient like to be referred to a Nutritionist or for Diabetic Management?  No   Diabetic Exams:  Diabetic Eye Exam: Overdue for diabetic eye exam. Pt has been advised about the importance in completing this exam. Patient advised to call and schedule an eye exam. Diabetic Foot Exam: Overdue, Pt has been advised about the importance in completing this exam. Pt is scheduled for diabetic foot exam on next appointment.   Interpreter Needed?: No  Information entered by :: NAllen LPN   Activities of Daily Living    03/27/2022    2:13 PM  In your present state of health, do you have any difficulty performing the following activities:  Hearing? 0  Vision? 0  Difficulty concentrating or making decisions? 0  Walking or climbing stairs? 1  Dressing or bathing? 1  Doing errands, shopping? 0  Preparing Food and eating ? N  Using the Toilet? N  In the past six months, have you accidently leaked urine? Y  Do you have problems with loss of bowel control? N  Managing your Medications? N  Managing your Finances? N  Housekeeping or managing your Housekeeping? N    Patient Care Team: Josue Hector, MD as PCP - Cardiology (Cardiology)  Indicate any recent Medical Services you may have received from other than [facility] providers in the past year (date may be approximate).     Assessment:   This is a routine wellness examination for [name].  Hearing/Vision screen Vision Screening - Comments:: No regular eye exams  Dietary issues and exercise activities discussed: Current Exercise Habits: The patient does not participate in regular exercise at present   Goals Addressed             This Visit's Progress    Patient Stated       03/27/2022, wants to be able to stand up       Depression Screen    03/27/2022    2:13 PM 03/14/2021    2:22 PM 03/08/2020    2:19 PM 11/09/2019    9:25 AM 09/22/2018   11:53 AM  09/16/2018   10:13 AM 03/16/2018    9:34 AM  PHQ 2/9 Scores  PHQ - 2 Score 0 0 0 0 0 0 0  PHQ- 9 Score     0 0     Fall Risk    03/27/2022    2:12 PM 03/14/2021    2:22 PM 03/08/2020    2:18 PM 11/09/2019    9:25 AM 12/28/2018    9:52 AM  [place] in the past year? 0 0 0 0 0  Number falls in past yr: 0      Injury with Fall? 0      Risk for fall due to : Impaired balance/gait;Impaired mobility;Medication side effect Impaired balance/gait;Impaired mobility;Medication side effect Impaired balance/gait;Impaired mobility;Medication side effect Medication side effect   Follow up Falls prevention discussed;Education provided;Falls evaluation completed Falls evaluation completed;Education provided;Falls prevention discussed Falls evaluation completed;Education provided;Falls prevention discussed Falls evaluation completed;Education provided;Falls prevention discussed     FALL RISK PREVENTION PERTAINING TO THE HOME:  Any stairs in or around the home? No  If so, are there any without handrails?  ramp Home free of loose throw rugs in walkways, pet beds, electrical cords, etc? Yes  Adequate lighting in your home to reduce risk of falls? Yes   ASSISTIVE DEVICES UTILIZED TO PREVENT FALLS:  Life alert? Yes  Use of a cane, walker or w/c? Yes  Grab bars in the bathroom? Yes  Shower chair or bench in shower? Yes  Elevated toilet seat or a handicapped toilet? Yes   TIMED UP AND GO:  Was the test performed? No .      Cognitive Function:        03/27/2022    2:14 PM 03/14/2021    2:24 PM 03/08/2020    2:19 PM 11/09/2019    9:27 AM 09/16/2018   10:15 AM  6CIT Screen  What Year? 0 points 0 points 0 points 0 points 0 points  What month? 0 points 0 points 0 points 0 points 0 points  What time? 3 points 3 points 0 points 0 points 0 points  Count back from 20 4 points 0 points 4 points 4 points 4 points  Months in reverse 4 points 0 points 4 points 4 points 0 points  Repeat phrase 4  points 8 points 6 points 0 points 0 points  Total Score 15 points 11 points 14 points 8 points 4 points    Immunizations Immunization History  Administered Date(s) Administered   Fluad Quad(high Dose 65+) 11/26/2018, 11/26/2018, 11/07/2019, 11/14/2020   Influenza, High Dose Seasonal PF 11/21/2017   Influenza-Unspecified 11/17/2012, 11/20/2017   PFIZER(Purple Top)SARS-COV-2 Vaccination 04/01/2019, 04/26/2019, 12/08/2019   Pfizer Covid-19 Vaccine Bivalent Booster 47yrs & up 03/18/2021   Pneumococcal Conjugate-13 11/19/2014   Pneumococcal Polysaccharide-23 01/02/2016, 12/28/2018   Zoster Recombinat (Shingrix) 11/26/2018, 02/14/2019    TDAP status: Due, Education has been provided regarding the importance of this vaccine. Advised may receive this vaccine at local pharmacy or Health Dept. Aware to provide a copy of the vaccination record if obtained from local pharmacy or Health Dept. Verbalized acceptance and understanding.  Flu Vaccine status: Up to date  Pneumococcal vaccine status: Up to date  Covid-19 vaccine status: Completed vaccines  Qualifies for Shingles Vaccine? Yes   Zostavax completed Yes   Shingrix Completed?: Yes  Screening Tests Health Maintenance  Topic Date Due   DTaP/Tdap/Td (1 - Tdap) Never done   INFLUENZA VACCINE  09/17/2021   COVID-19 Vaccine (  5 - 2023-24 season) 10/18/2021   FOOT EXAM  12/06/2021   HEMOGLOBIN A1C  01/16/2022   OPHTHALMOLOGY EXAM  02/13/2022   Medicare Annual Wellness (AWV)  03/14/2022   MAMMOGRAM  04/04/2022   Pneumonia Vaccine 48+ Years old  Completed   DEXA SCAN  Completed   Zoster Vaccines- Shingrix  Completed   HPV VACCINES  Aged Out    Health Maintenance  Health Maintenance Due  Topic Date Due   DTaP/Tdap/Td (1 - Tdap) Never done   INFLUENZA VACCINE  09/17/2021   COVID-19 Vaccine (5 - 2023-24 season) 10/18/2021   FOOT EXAM  12/06/2021   HEMOGLOBIN A1C  01/16/2022   OPHTHALMOLOGY EXAM  02/13/2022   Medicare Annual  Wellness (AWV)  03/14/2022    Colorectal cancer screening: No longer required.   Mammogram status: No longer required due to age.  Bone Density status: Ordered 08/14/2021. Pt provided with contact info and advised to call to schedule appt.  Lung Cancer Screening: (Low Dose CT Chest recommended if Age 87-80 years, 30 pack-year currently smoking OR have quit w/in 15years.) does not qualify.   Lung Cancer Screening Referral: no  Additional Screening:  Hepatitis C Screening: does not qualify;   Vision Screening: Recommended annual ophthalmology exams for early detection of glaucoma and other disorders of the eye. Is the patient up to date with their annual eye exam?  No  Who is the provider or what is the name of the office in which the patient attends annual eye exams? none If pt is not established with a provider, would they like to be referred to a provider to establish care? No .   Dental Screening: Recommended annual dental exams for proper oral hygiene  Community Resource Referral / Chronic Care Management: CRR required this visit?  No   CCM required this visit?  No      Plan:     I have personally reviewed and noted the following in the patient's chart:   Medical and social history Use of alcohol, tobacco or illicit drugs  Current medications and supplements including opioid prescriptions. Patient is not currently taking opioid prescriptions. Functional ability and status Nutritional status Physical activity Advanced directives List of other physicians Hospitalizations, surgeries, and ER visits in previous 12 months Vitals Screenings to include cognitive, depression, and falls Referrals and appointments  In addition, I have reviewed and discussed with patient certain preventive protocols, quality metrics, and best practice recommendations. A written personalized care plan for preventive services as well as general preventive health recommendations were provided to  patient.     Kellie Simmering, LPN   02/22/1759   Nurse Notes: Home care is inconsistent. States has been going through a lot of aides.  Due to this being a virtual visit, the after visit summary with patients personalized plan was offered to patient via mail or my-chart.  to pick up at office at next visit

## 2022-03-27 NOTE — Telephone Encounter (Signed)
Called remote health to confirm patient is established. Spoke with [employer] Patient just recently had an apt on 03/14/2022. She does see remote health for her overall care.

## 2022-03-31 DIAGNOSIS — I2510 Atherosclerotic heart disease of native coronary artery without angina pectoris: Secondary | ICD-10-CM | POA: Diagnosis not present

## 2022-03-31 DIAGNOSIS — M1990 Unspecified osteoarthritis, unspecified site: Secondary | ICD-10-CM | POA: Diagnosis not present

## 2022-03-31 DIAGNOSIS — N184 Chronic kidney disease, stage 4 (severe): Secondary | ICD-10-CM | POA: Diagnosis not present

## 2022-03-31 DIAGNOSIS — E782 Mixed hyperlipidemia: Secondary | ICD-10-CM | POA: Diagnosis not present

## 2022-03-31 DIAGNOSIS — S32010D Wedge compression fracture of first lumbar vertebra, subsequent encounter for fracture with routine healing: Secondary | ICD-10-CM | POA: Diagnosis not present

## 2022-03-31 DIAGNOSIS — M48062 Spinal stenosis, lumbar region with neurogenic claudication: Secondary | ICD-10-CM | POA: Diagnosis not present

## 2022-03-31 DIAGNOSIS — I69351 Hemiplegia and hemiparesis following cerebral infarction affecting right dominant side: Secondary | ICD-10-CM | POA: Diagnosis not present

## 2022-03-31 DIAGNOSIS — I129 Hypertensive chronic kidney disease with stage 1 through stage 4 chronic kidney disease, or unspecified chronic kidney disease: Secondary | ICD-10-CM | POA: Diagnosis not present

## 2022-03-31 DIAGNOSIS — E1122 Type 2 diabetes mellitus with diabetic chronic kidney disease: Secondary | ICD-10-CM | POA: Diagnosis not present

## 2022-04-07 DIAGNOSIS — M1611 Unilateral primary osteoarthritis, right hip: Secondary | ICD-10-CM | POA: Diagnosis not present

## 2022-04-07 DIAGNOSIS — I10 Essential (primary) hypertension: Secondary | ICD-10-CM | POA: Diagnosis not present

## 2022-04-07 DIAGNOSIS — Z8673 Personal history of transient ischemic attack (TIA), and cerebral infarction without residual deficits: Secondary | ICD-10-CM | POA: Diagnosis not present

## 2022-04-07 DIAGNOSIS — M069 Rheumatoid arthritis, unspecified: Secondary | ICD-10-CM | POA: Diagnosis not present

## 2022-04-07 DIAGNOSIS — R54 Age-related physical debility: Secondary | ICD-10-CM | POA: Diagnosis not present

## 2022-04-07 DIAGNOSIS — E1165 Type 2 diabetes mellitus with hyperglycemia: Secondary | ICD-10-CM | POA: Diagnosis not present

## 2022-04-07 DIAGNOSIS — Z5982 Transportation insecurity: Secondary | ICD-10-CM | POA: Diagnosis not present

## 2022-04-21 DIAGNOSIS — S32010A Wedge compression fracture of first lumbar vertebra, initial encounter for closed fracture: Secondary | ICD-10-CM | POA: Diagnosis not present

## 2022-04-21 DIAGNOSIS — M1611 Unilateral primary osteoarthritis, right hip: Secondary | ICD-10-CM | POA: Diagnosis not present

## 2022-04-21 DIAGNOSIS — R2681 Unsteadiness on feet: Secondary | ICD-10-CM | POA: Diagnosis not present

## 2022-04-21 DIAGNOSIS — E1122 Type 2 diabetes mellitus with diabetic chronic kidney disease: Secondary | ICD-10-CM | POA: Diagnosis not present

## 2022-04-21 DIAGNOSIS — I129 Hypertensive chronic kidney disease with stage 1 through stage 4 chronic kidney disease, or unspecified chronic kidney disease: Secondary | ICD-10-CM | POA: Diagnosis not present

## 2022-04-21 DIAGNOSIS — E1151 Type 2 diabetes mellitus with diabetic peripheral angiopathy without gangrene: Secondary | ICD-10-CM | POA: Diagnosis not present

## 2022-04-21 DIAGNOSIS — M069 Rheumatoid arthritis, unspecified: Secondary | ICD-10-CM | POA: Diagnosis not present

## 2022-04-21 DIAGNOSIS — N184 Chronic kidney disease, stage 4 (severe): Secondary | ICD-10-CM | POA: Diagnosis not present

## 2022-04-21 DIAGNOSIS — E1165 Type 2 diabetes mellitus with hyperglycemia: Secondary | ICD-10-CM | POA: Diagnosis not present

## 2022-04-29 DIAGNOSIS — M48062 Spinal stenosis, lumbar region with neurogenic claudication: Secondary | ICD-10-CM | POA: Diagnosis not present

## 2022-04-29 DIAGNOSIS — I69351 Hemiplegia and hemiparesis following cerebral infarction affecting right dominant side: Secondary | ICD-10-CM | POA: Diagnosis not present

## 2022-04-29 DIAGNOSIS — I2510 Atherosclerotic heart disease of native coronary artery without angina pectoris: Secondary | ICD-10-CM | POA: Diagnosis not present

## 2022-04-29 DIAGNOSIS — I129 Hypertensive chronic kidney disease with stage 1 through stage 4 chronic kidney disease, or unspecified chronic kidney disease: Secondary | ICD-10-CM | POA: Diagnosis not present

## 2022-04-29 DIAGNOSIS — M1990 Unspecified osteoarthritis, unspecified site: Secondary | ICD-10-CM | POA: Diagnosis not present

## 2022-04-29 DIAGNOSIS — N184 Chronic kidney disease, stage 4 (severe): Secondary | ICD-10-CM | POA: Diagnosis not present

## 2022-04-29 DIAGNOSIS — S32010D Wedge compression fracture of first lumbar vertebra, subsequent encounter for fracture with routine healing: Secondary | ICD-10-CM | POA: Diagnosis not present

## 2022-04-29 DIAGNOSIS — E1122 Type 2 diabetes mellitus with diabetic chronic kidney disease: Secondary | ICD-10-CM | POA: Diagnosis not present

## 2022-04-29 DIAGNOSIS — E782 Mixed hyperlipidemia: Secondary | ICD-10-CM | POA: Diagnosis not present

## 2022-05-05 DIAGNOSIS — N184 Chronic kidney disease, stage 4 (severe): Secondary | ICD-10-CM | POA: Diagnosis not present

## 2022-05-05 DIAGNOSIS — E1165 Type 2 diabetes mellitus with hyperglycemia: Secondary | ICD-10-CM | POA: Diagnosis not present

## 2022-05-05 DIAGNOSIS — M1611 Unilateral primary osteoarthritis, right hip: Secondary | ICD-10-CM | POA: Diagnosis not present

## 2022-05-05 DIAGNOSIS — E1122 Type 2 diabetes mellitus with diabetic chronic kidney disease: Secondary | ICD-10-CM | POA: Diagnosis not present

## 2022-05-05 DIAGNOSIS — R2681 Unsteadiness on feet: Secondary | ICD-10-CM | POA: Diagnosis not present

## 2022-05-05 DIAGNOSIS — R54 Age-related physical debility: Secondary | ICD-10-CM | POA: Diagnosis not present

## 2022-05-05 DIAGNOSIS — I129 Hypertensive chronic kidney disease with stage 1 through stage 4 chronic kidney disease, or unspecified chronic kidney disease: Secondary | ICD-10-CM | POA: Diagnosis not present

## 2022-05-05 DIAGNOSIS — S32010D Wedge compression fracture of first lumbar vertebra, subsequent encounter for fracture with routine healing: Secondary | ICD-10-CM | POA: Diagnosis not present

## 2022-05-05 DIAGNOSIS — M069 Rheumatoid arthritis, unspecified: Secondary | ICD-10-CM | POA: Diagnosis not present

## 2022-05-14 DIAGNOSIS — E1151 Type 2 diabetes mellitus with diabetic peripheral angiopathy without gangrene: Secondary | ICD-10-CM | POA: Diagnosis not present

## 2022-05-14 DIAGNOSIS — E1122 Type 2 diabetes mellitus with diabetic chronic kidney disease: Secondary | ICD-10-CM | POA: Diagnosis not present

## 2022-05-14 DIAGNOSIS — I129 Hypertensive chronic kidney disease with stage 1 through stage 4 chronic kidney disease, or unspecified chronic kidney disease: Secondary | ICD-10-CM | POA: Diagnosis not present

## 2022-05-14 DIAGNOSIS — E785 Hyperlipidemia, unspecified: Secondary | ICD-10-CM | POA: Diagnosis not present

## 2022-05-14 DIAGNOSIS — M1711 Unilateral primary osteoarthritis, right knee: Secondary | ICD-10-CM | POA: Diagnosis not present

## 2022-05-14 DIAGNOSIS — M069 Rheumatoid arthritis, unspecified: Secondary | ICD-10-CM | POA: Diagnosis not present

## 2022-05-14 DIAGNOSIS — N184 Chronic kidney disease, stage 4 (severe): Secondary | ICD-10-CM | POA: Diagnosis not present

## 2022-05-14 DIAGNOSIS — R54 Age-related physical debility: Secondary | ICD-10-CM | POA: Diagnosis not present

## 2022-05-14 DIAGNOSIS — E7801 Familial hypercholesterolemia: Secondary | ICD-10-CM | POA: Diagnosis not present

## 2022-05-14 DIAGNOSIS — N189 Chronic kidney disease, unspecified: Secondary | ICD-10-CM | POA: Diagnosis not present

## 2022-05-14 DIAGNOSIS — N2581 Secondary hyperparathyroidism of renal origin: Secondary | ICD-10-CM | POA: Diagnosis not present

## 2022-05-14 DIAGNOSIS — E1165 Type 2 diabetes mellitus with hyperglycemia: Secondary | ICD-10-CM | POA: Diagnosis not present

## 2022-05-14 DIAGNOSIS — D631 Anemia in chronic kidney disease: Secondary | ICD-10-CM | POA: Diagnosis not present

## 2022-05-15 LAB — LAB REPORT - SCANNED
Albumin, Urine POC: 51.4
Creatinine, POC: 50.1 mg/dL
EGFR: 20
Microalb Creat Ratio: 103

## 2022-05-19 DIAGNOSIS — M069 Rheumatoid arthritis, unspecified: Secondary | ICD-10-CM | POA: Diagnosis not present

## 2022-05-19 DIAGNOSIS — M1611 Unilateral primary osteoarthritis, right hip: Secondary | ICD-10-CM | POA: Diagnosis not present

## 2022-05-19 DIAGNOSIS — E1165 Type 2 diabetes mellitus with hyperglycemia: Secondary | ICD-10-CM | POA: Diagnosis not present

## 2022-05-19 DIAGNOSIS — S32010D Wedge compression fracture of first lumbar vertebra, subsequent encounter for fracture with routine healing: Secondary | ICD-10-CM | POA: Diagnosis not present

## 2022-05-26 ENCOUNTER — Other Ambulatory Visit: Payer: Self-pay | Admitting: Internal Medicine

## 2022-05-26 DIAGNOSIS — I2510 Atherosclerotic heart disease of native coronary artery without angina pectoris: Secondary | ICD-10-CM

## 2022-05-30 DIAGNOSIS — N184 Chronic kidney disease, stage 4 (severe): Secondary | ICD-10-CM | POA: Diagnosis not present

## 2022-05-30 DIAGNOSIS — M1990 Unspecified osteoarthritis, unspecified site: Secondary | ICD-10-CM | POA: Diagnosis not present

## 2022-05-30 DIAGNOSIS — M48062 Spinal stenosis, lumbar region with neurogenic claudication: Secondary | ICD-10-CM | POA: Diagnosis not present

## 2022-05-30 DIAGNOSIS — E782 Mixed hyperlipidemia: Secondary | ICD-10-CM | POA: Diagnosis not present

## 2022-05-30 DIAGNOSIS — I69351 Hemiplegia and hemiparesis following cerebral infarction affecting right dominant side: Secondary | ICD-10-CM | POA: Diagnosis not present

## 2022-05-30 DIAGNOSIS — S32010D Wedge compression fracture of first lumbar vertebra, subsequent encounter for fracture with routine healing: Secondary | ICD-10-CM | POA: Diagnosis not present

## 2022-05-30 DIAGNOSIS — I129 Hypertensive chronic kidney disease with stage 1 through stage 4 chronic kidney disease, or unspecified chronic kidney disease: Secondary | ICD-10-CM | POA: Diagnosis not present

## 2022-05-30 DIAGNOSIS — E1122 Type 2 diabetes mellitus with diabetic chronic kidney disease: Secondary | ICD-10-CM | POA: Diagnosis not present

## 2022-05-30 DIAGNOSIS — I2510 Atherosclerotic heart disease of native coronary artery without angina pectoris: Secondary | ICD-10-CM | POA: Diagnosis not present

## 2022-06-03 DIAGNOSIS — J302 Other seasonal allergic rhinitis: Secondary | ICD-10-CM | POA: Diagnosis not present

## 2022-06-03 DIAGNOSIS — I10 Essential (primary) hypertension: Secondary | ICD-10-CM | POA: Diagnosis not present

## 2022-06-03 DIAGNOSIS — M1611 Unilateral primary osteoarthritis, right hip: Secondary | ICD-10-CM | POA: Diagnosis not present

## 2022-06-09 ENCOUNTER — Telehealth: Payer: Self-pay | Admitting: Cardiovascular Disease

## 2022-06-09 ENCOUNTER — Other Ambulatory Visit: Payer: Self-pay | Admitting: Internal Medicine

## 2022-06-09 DIAGNOSIS — I2510 Atherosclerotic heart disease of native coronary artery without angina pectoris: Secondary | ICD-10-CM

## 2022-06-09 NOTE — Telephone Encounter (Signed)
Patient called back. Received information on who her PCP is currently. Patient stated [name] with [facility] is her PCP. Faxed lab work to her office.

## 2022-06-09 NOTE — Telephone Encounter (Signed)
Patient states she is returning a call, but she is not sure of who called or what the call was regarding.

## 2022-06-19 ENCOUNTER — Telehealth: Payer: Self-pay | Admitting: Cardiovascular Disease

## 2022-06-19 ENCOUNTER — Other Ambulatory Visit: Payer: Self-pay | Admitting: Internal Medicine

## 2022-06-19 NOTE — Telephone Encounter (Signed)
Pt is requesting Dr. Eden Emms to refill medication Dipyridamole-aspirin (Aggrenox). Would Dr. Eden Emms like to refill this medication? Please address

## 2022-06-19 NOTE — Telephone Encounter (Signed)
*  STAT* If patient is at the pharmacy, call can be transferred to refill team.   1. Which medications need to be refilled? (please list name of each medication and dose if known)   dipyridamole-aspirin (AGGRENOX) 200-25 MG 12hr capsule   2. Which pharmacy/location (including street and city if local pharmacy) is medication to be sent to?  [id number] - [address] [place] [address], [place] - [address] AT [address]   3. Do they need a 30 day or 90 day supply?   90 day  Patient stated she is completely out of this medication.

## 2022-06-19 NOTE — Telephone Encounter (Signed)
Called patient, she is going to follow up with her PCP.

## 2022-06-20 DIAGNOSIS — E1122 Type 2 diabetes mellitus with diabetic chronic kidney disease: Secondary | ICD-10-CM | POA: Diagnosis not present

## 2022-06-20 DIAGNOSIS — R54 Age-related physical debility: Secondary | ICD-10-CM | POA: Diagnosis not present

## 2022-06-20 DIAGNOSIS — N184 Chronic kidney disease, stage 4 (severe): Secondary | ICD-10-CM | POA: Diagnosis not present

## 2022-06-20 DIAGNOSIS — M1711 Unilateral primary osteoarthritis, right knee: Secondary | ICD-10-CM | POA: Diagnosis not present

## 2022-06-20 DIAGNOSIS — I129 Hypertensive chronic kidney disease with stage 1 through stage 4 chronic kidney disease, or unspecified chronic kidney disease: Secondary | ICD-10-CM | POA: Diagnosis not present

## 2022-06-20 DIAGNOSIS — E1165 Type 2 diabetes mellitus with hyperglycemia: Secondary | ICD-10-CM | POA: Diagnosis not present

## 2022-06-20 DIAGNOSIS — E1151 Type 2 diabetes mellitus with diabetic peripheral angiopathy without gangrene: Secondary | ICD-10-CM | POA: Diagnosis not present

## 2022-06-20 DIAGNOSIS — M069 Rheumatoid arthritis, unspecified: Secondary | ICD-10-CM | POA: Diagnosis not present

## 2022-06-29 DIAGNOSIS — S32010D Wedge compression fracture of first lumbar vertebra, subsequent encounter for fracture with routine healing: Secondary | ICD-10-CM | POA: Diagnosis not present

## 2022-06-29 DIAGNOSIS — E1122 Type 2 diabetes mellitus with diabetic chronic kidney disease: Secondary | ICD-10-CM | POA: Diagnosis not present

## 2022-06-29 DIAGNOSIS — I129 Hypertensive chronic kidney disease with stage 1 through stage 4 chronic kidney disease, or unspecified chronic kidney disease: Secondary | ICD-10-CM | POA: Diagnosis not present

## 2022-06-29 DIAGNOSIS — I69351 Hemiplegia and hemiparesis following cerebral infarction affecting right dominant side: Secondary | ICD-10-CM | POA: Diagnosis not present

## 2022-06-29 DIAGNOSIS — M48062 Spinal stenosis, lumbar region with neurogenic claudication: Secondary | ICD-10-CM | POA: Diagnosis not present

## 2022-06-29 DIAGNOSIS — I2510 Atherosclerotic heart disease of native coronary artery without angina pectoris: Secondary | ICD-10-CM | POA: Diagnosis not present

## 2022-06-29 DIAGNOSIS — M1990 Unspecified osteoarthritis, unspecified site: Secondary | ICD-10-CM | POA: Diagnosis not present

## 2022-06-29 DIAGNOSIS — E782 Mixed hyperlipidemia: Secondary | ICD-10-CM | POA: Diagnosis not present

## 2022-06-29 DIAGNOSIS — N184 Chronic kidney disease, stage 4 (severe): Secondary | ICD-10-CM | POA: Diagnosis not present

## 2022-07-04 DIAGNOSIS — E1165 Type 2 diabetes mellitus with hyperglycemia: Secondary | ICD-10-CM | POA: Diagnosis not present

## 2022-07-04 DIAGNOSIS — R54 Age-related physical debility: Secondary | ICD-10-CM | POA: Diagnosis not present

## 2022-07-04 DIAGNOSIS — J302 Other seasonal allergic rhinitis: Secondary | ICD-10-CM | POA: Diagnosis not present

## 2022-07-04 DIAGNOSIS — I10 Essential (primary) hypertension: Secondary | ICD-10-CM | POA: Diagnosis not present

## 2022-07-04 DIAGNOSIS — M1611 Unilateral primary osteoarthritis, right hip: Secondary | ICD-10-CM | POA: Diagnosis not present

## 2022-07-30 DIAGNOSIS — M1990 Unspecified osteoarthritis, unspecified site: Secondary | ICD-10-CM | POA: Diagnosis not present

## 2022-07-30 DIAGNOSIS — E782 Mixed hyperlipidemia: Secondary | ICD-10-CM | POA: Diagnosis not present

## 2022-07-30 DIAGNOSIS — E1122 Type 2 diabetes mellitus with diabetic chronic kidney disease: Secondary | ICD-10-CM | POA: Diagnosis not present

## 2022-07-30 DIAGNOSIS — I129 Hypertensive chronic kidney disease with stage 1 through stage 4 chronic kidney disease, or unspecified chronic kidney disease: Secondary | ICD-10-CM | POA: Diagnosis not present

## 2022-07-30 DIAGNOSIS — M48062 Spinal stenosis, lumbar region with neurogenic claudication: Secondary | ICD-10-CM | POA: Diagnosis not present

## 2022-07-30 DIAGNOSIS — N184 Chronic kidney disease, stage 4 (severe): Secondary | ICD-10-CM | POA: Diagnosis not present

## 2022-07-30 DIAGNOSIS — S32010D Wedge compression fracture of first lumbar vertebra, subsequent encounter for fracture with routine healing: Secondary | ICD-10-CM | POA: Diagnosis not present

## 2022-07-30 DIAGNOSIS — I69351 Hemiplegia and hemiparesis following cerebral infarction affecting right dominant side: Secondary | ICD-10-CM | POA: Diagnosis not present

## 2022-07-30 DIAGNOSIS — I2510 Atherosclerotic heart disease of native coronary artery without angina pectoris: Secondary | ICD-10-CM | POA: Diagnosis not present

## 2022-08-06 ENCOUNTER — Emergency Department (HOSPITAL_COMMUNITY): Payer: Medicare PPO

## 2022-08-06 ENCOUNTER — Inpatient Hospital Stay (HOSPITAL_COMMUNITY)
Admission: EM | Admit: 2022-08-06 | Discharge: 2022-08-13 | DRG: 065 | Disposition: A | Discharge status: Skilled Nursing Facility | Payer: Medicare PPO | Attending: Family Medicine | Admitting: Family Medicine

## 2022-08-06 ENCOUNTER — Other Ambulatory Visit: Payer: Self-pay

## 2022-08-06 DIAGNOSIS — I25810 Atherosclerosis of coronary artery bypass graft(s) without angina pectoris: Secondary | ICD-10-CM | POA: Diagnosis present

## 2022-08-06 DIAGNOSIS — M5416 Radiculopathy, lumbar region: Secondary | ICD-10-CM | POA: Diagnosis present

## 2022-08-06 DIAGNOSIS — Z8249 Family history of ischemic heart disease and other diseases of the circulatory system: Secondary | ICD-10-CM

## 2022-08-06 DIAGNOSIS — M069 Rheumatoid arthritis, unspecified: Secondary | ICD-10-CM | POA: Diagnosis present

## 2022-08-06 DIAGNOSIS — I6782 Cerebral ischemia: Secondary | ICD-10-CM | POA: Diagnosis not present

## 2022-08-06 DIAGNOSIS — E1122 Type 2 diabetes mellitus with diabetic chronic kidney disease: Secondary | ICD-10-CM | POA: Diagnosis present

## 2022-08-06 DIAGNOSIS — R29701 NIHSS score 1: Secondary | ICD-10-CM | POA: Diagnosis present

## 2022-08-06 DIAGNOSIS — E785 Hyperlipidemia, unspecified: Secondary | ICD-10-CM | POA: Diagnosis present

## 2022-08-06 DIAGNOSIS — Z79899 Other long term (current) drug therapy: Secondary | ICD-10-CM

## 2022-08-06 DIAGNOSIS — N184 Chronic kidney disease, stage 4 (severe): Secondary | ICD-10-CM | POA: Diagnosis present

## 2022-08-06 DIAGNOSIS — M109 Gout, unspecified: Secondary | ICD-10-CM | POA: Diagnosis present

## 2022-08-06 DIAGNOSIS — E871 Hypo-osmolality and hyponatremia: Secondary | ICD-10-CM | POA: Diagnosis not present

## 2022-08-06 DIAGNOSIS — R29818 Other symptoms and signs involving the nervous system: Secondary | ICD-10-CM | POA: Diagnosis not present

## 2022-08-06 DIAGNOSIS — M6281 Muscle weakness (generalized): Secondary | ICD-10-CM | POA: Diagnosis not present

## 2022-08-06 DIAGNOSIS — R1310 Dysphagia, unspecified: Secondary | ICD-10-CM | POA: Diagnosis present

## 2022-08-06 DIAGNOSIS — Z7982 Long term (current) use of aspirin: Secondary | ICD-10-CM

## 2022-08-06 DIAGNOSIS — I129 Hypertensive chronic kidney disease with stage 1 through stage 4 chronic kidney disease, or unspecified chronic kidney disease: Secondary | ICD-10-CM | POA: Diagnosis present

## 2022-08-06 DIAGNOSIS — I2510 Atherosclerotic heart disease of native coronary artery without angina pectoris: Secondary | ICD-10-CM | POA: Diagnosis present

## 2022-08-06 DIAGNOSIS — E119 Type 2 diabetes mellitus without complications: Secondary | ICD-10-CM

## 2022-08-06 DIAGNOSIS — R5383 Other fatigue: Secondary | ICD-10-CM | POA: Diagnosis not present

## 2022-08-06 DIAGNOSIS — G8324 Monoplegia of upper limb affecting left nondominant side: Secondary | ICD-10-CM | POA: Diagnosis present

## 2022-08-06 DIAGNOSIS — R001 Bradycardia, unspecified: Secondary | ICD-10-CM | POA: Diagnosis not present

## 2022-08-06 DIAGNOSIS — I69354 Hemiplegia and hemiparesis following cerebral infarction affecting left non-dominant side: Secondary | ICD-10-CM | POA: Diagnosis not present

## 2022-08-06 DIAGNOSIS — R29898 Other symptoms and signs involving the musculoskeletal system: Principal | ICD-10-CM

## 2022-08-06 DIAGNOSIS — N179 Acute kidney failure, unspecified: Secondary | ICD-10-CM | POA: Diagnosis present

## 2022-08-06 DIAGNOSIS — I6300 Cerebral infarction due to thrombosis of unspecified precerebral artery: Secondary | ICD-10-CM | POA: Diagnosis not present

## 2022-08-06 DIAGNOSIS — Z993 Dependence on wheelchair: Secondary | ICD-10-CM | POA: Diagnosis not present

## 2022-08-06 DIAGNOSIS — Z7984 Long term (current) use of oral hypoglycemic drugs: Secondary | ICD-10-CM | POA: Diagnosis not present

## 2022-08-06 DIAGNOSIS — Z7401 Bed confinement status: Secondary | ICD-10-CM | POA: Diagnosis not present

## 2022-08-06 DIAGNOSIS — R202 Paresthesia of skin: Secondary | ICD-10-CM | POA: Diagnosis not present

## 2022-08-06 DIAGNOSIS — M1611 Unilateral primary osteoarthritis, right hip: Secondary | ICD-10-CM | POA: Diagnosis present

## 2022-08-06 DIAGNOSIS — E1169 Type 2 diabetes mellitus with other specified complication: Secondary | ICD-10-CM

## 2022-08-06 DIAGNOSIS — R5381 Other malaise: Secondary | ICD-10-CM | POA: Diagnosis not present

## 2022-08-06 DIAGNOSIS — T447X5A Adverse effect of beta-adrenoreceptor antagonists, initial encounter: Secondary | ICD-10-CM | POA: Diagnosis not present

## 2022-08-06 DIAGNOSIS — R2681 Unsteadiness on feet: Secondary | ICD-10-CM | POA: Diagnosis not present

## 2022-08-06 DIAGNOSIS — R531 Weakness: Secondary | ICD-10-CM | POA: Diagnosis not present

## 2022-08-06 DIAGNOSIS — R200 Anesthesia of skin: Secondary | ICD-10-CM | POA: Diagnosis present

## 2022-08-06 DIAGNOSIS — I639 Cerebral infarction, unspecified: Secondary | ICD-10-CM | POA: Diagnosis present

## 2022-08-06 DIAGNOSIS — N183 Chronic kidney disease, stage 3 unspecified: Secondary | ICD-10-CM | POA: Diagnosis not present

## 2022-08-06 DIAGNOSIS — Z8673 Personal history of transient ischemic attack (TIA), and cerebral infarction without residual deficits: Secondary | ICD-10-CM | POA: Diagnosis present

## 2022-08-06 DIAGNOSIS — I69391 Dysphagia following cerebral infarction: Secondary | ICD-10-CM | POA: Diagnosis not present

## 2022-08-06 DIAGNOSIS — M4802 Spinal stenosis, cervical region: Secondary | ICD-10-CM | POA: Diagnosis present

## 2022-08-06 DIAGNOSIS — G459 Transient cerebral ischemic attack, unspecified: Secondary | ICD-10-CM | POA: Diagnosis not present

## 2022-08-06 DIAGNOSIS — R1312 Dysphagia, oropharyngeal phase: Secondary | ICD-10-CM | POA: Diagnosis not present

## 2022-08-06 DIAGNOSIS — I6389 Other cerebral infarction: Secondary | ICD-10-CM | POA: Diagnosis not present

## 2022-08-06 DIAGNOSIS — I10 Essential (primary) hypertension: Secondary | ICD-10-CM | POA: Diagnosis present

## 2022-08-06 DIAGNOSIS — R54 Age-related physical debility: Secondary | ICD-10-CM | POA: Diagnosis present

## 2022-08-06 DIAGNOSIS — R2689 Other abnormalities of gait and mobility: Secondary | ICD-10-CM | POA: Diagnosis not present

## 2022-08-06 HISTORY — DX: Cerebral infarction, unspecified: I63.9

## 2022-08-06 LAB — I-STAT CHEM 8, ED
BUN: 50 mg/dL — ABNORMAL HIGH (ref 8–23)
Calcium, Ion: 1.17 mmol/L (ref 1.15–1.40)
Chloride: 106 mmol/L (ref 98–111)
Creatinine, Ser: 2.1 mg/dL — ABNORMAL HIGH (ref 0.44–1.00)
Glucose, Bld: 151 mg/dL — ABNORMAL HIGH (ref 70–99)
HCT: 42 % (ref 36.0–46.0)
Hemoglobin: 14.3 g/dL (ref 12.0–15.0)
Potassium: 3.8 mmol/L (ref 3.5–5.1)
Sodium: 143 mmol/L (ref 135–145)
TCO2: 25 mmol/L (ref 22–32)

## 2022-08-06 LAB — COMPREHENSIVE METABOLIC PANEL
ALT: 13 U/L (ref 0–44)
AST: 20 U/L (ref 15–41)
Albumin: 4 g/dL (ref 3.5–5.0)
Alkaline Phosphatase: 72 U/L (ref 38–126)
Anion gap: 13 (ref 5–15)
BUN: 51 mg/dL — ABNORMAL HIGH (ref 8–23)
CO2: 25 mmol/L (ref 22–32)
Calcium: 9.6 mg/dL (ref 8.9–10.3)
Chloride: 104 mmol/L (ref 98–111)
Creatinine, Ser: 2.08 mg/dL — ABNORMAL HIGH (ref 0.44–1.00)
GFR, Estimated: 22 mL/min — ABNORMAL LOW (ref >60)
Glucose, Bld: 150 mg/dL — ABNORMAL HIGH (ref 70–99)
Potassium: 3.8 mmol/L (ref 3.5–5.1)
Sodium: 142 mmol/L (ref 135–145)
Total Bilirubin: 0.4 mg/dL (ref 0.3–1.2)
Total Protein: 7.2 g/dL (ref 6.5–8.1)

## 2022-08-06 LAB — CBG MONITORING, ED
Glucose-Capillary: 129 mg/dL — ABNORMAL HIGH (ref 70–99)
Glucose-Capillary: 128 mg/dL — ABNORMAL HIGH (ref 70–99)
Glucose-Capillary: 128 mg/dL — ABNORMAL HIGH (ref 70–99)

## 2022-08-06 LAB — DIFFERENTIAL
Abs Immature Granulocytes: 0.01 10*3/uL (ref 0.00–0.07)
Basophils Absolute: 0 10*3/uL (ref 0.0–0.1)
Basophils Relative: 0 %
Eosinophils Absolute: 0.1 10*3/uL (ref 0.0–0.5)
Eosinophils Relative: 1 %
Immature Granulocytes: 0 %
Lymphocytes Relative: 20 %
Lymphs Abs: 1.1 10*3/uL (ref 0.7–4.0)
Monocytes Absolute: 0.5 10*3/uL (ref 0.1–1.0)
Monocytes Relative: 9 %
Neutro Abs: 3.8 10*3/uL (ref 1.7–7.7)
Neutrophils Relative %: 70 %

## 2022-08-06 LAB — CBC
HCT: 40.9 % (ref 36.0–46.0)
Hemoglobin: 13.3 g/dL (ref 12.0–15.0)
MCH: 32.4 pg (ref 26.0–34.0)
MCHC: 32.5 g/dL (ref 30.0–36.0)
MCV: 99.8 fL (ref 80.0–100.0)
Platelets: 229 10*3/uL (ref 150–400)
RBC: 4.1 MIL/uL (ref 3.87–5.11)
RDW: 12 % (ref 11.5–15.5)
WBC: 5.6 10*3/uL (ref 4.0–10.5)
nRBC: 0 % (ref 0.0–0.2)

## 2022-08-06 LAB — TROPONIN I (HIGH SENSITIVITY)
Troponin I (High Sensitivity): 53 ng/L — ABNORMAL HIGH (ref <18)
Troponin I (High Sensitivity): 54 ng/L — ABNORMAL HIGH (ref <18)

## 2022-08-06 LAB — PROTIME-INR
INR: 1 (ref 0.8–1.2)
Prothrombin Time: 12.9 seconds (ref 11.4–15.2)

## 2022-08-06 LAB — ETHANOL: Alcohol, Ethyl (B): <10 mg/dL (ref <10)

## 2022-08-06 LAB — APTT: aPTT: 27 seconds (ref 24–36)

## 2022-08-06 LAB — MAGNESIUM: Magnesium: 2.1 mg/dL (ref 1.7–2.4)

## 2022-08-06 MED ORDER — SODIUM CHLORIDE 0.9% FLUSH
3.0000 mL | Freq: Once | INTRAVENOUS | Status: AC
  Administered 2022-08-06: 3 mL via INTRAVENOUS

## 2022-08-06 NOTE — ED Provider Notes (Signed)
[place] EMERGENCY DEPARTMENT AT [facility] Provider Note   CSN: [id number] Arrival date & time: 08/06/22  1438     History {Add pertinent medical, surgical, social history, OB history to HPI:1} Chief Complaint  Patient presents with   hand numbness    Pt coming from home when she woke up at 0200 AM with numbness to left hand only. No other neuro deficits noted.     [name] is a 89 y.o. female with HTN, HLD, CAD, rheumatoid arthritis, T2DM, CKD stage III, PAD, gout presents with left hand numbness and weakness that started at about 2:00 this morning.  She is never had this happen before.  She does have a history of a remote stroke in 2003 but had mostly recovered from that and had no baseline weakness until about 2 months ago when she started developing right lower extremity weakness.  She states that is difficult to walk on now.  Had previously been walking with a walker but now is confined to a wheelchair.  She states that since she has been waiting in the emergency department the left hand numbness and weakness has begun to improve but it still does not feel normal to her.  She denies any other numbness tingling, weakness besides the right leg, slurred speech, confusion, head trauma, visual changes.  She denies any back pain or back trauma.  No urinary or bowel incontinence/retention.  No fevers or chills.  No history of malignancy.  She denies any pain in the hand or leg.  Does not take a blood thinner.  HPI     Home Medications Prior to Admission medications   Medication Sig Start Date End Date Taking? Authorizing Provider  Accu-Chek Softclix Lancets lancets Use as instructed to check blood sugars up to 3 times a day  Dx code e11.65 05/17/19   Dorothyann Peng, MD  acetaminophen (TYLENOL) 650 MG CR tablet Take 650 mg by mouth every 8 (eight) hours as needed for pain.    [provider]  amLODipine (NORVASC) 5 MG tablet TAKE 1 TABLET(5 MG) BY MOUTH DAILY  10/14/21   Dorothyann Peng, MD  Blood Glucose Calibration (ACCU-CHEK AVIVA) SOLN Use with accu-check aviva machine 06/02/19   Dorothyann Peng, MD  Blood Glucose Monitoring Suppl (ACCU-CHEK AVIVA PLUS) w/Device KIT Use to check  Blood sugars up to 3 times a day.  Dx code e11.65 12/10/20   Dorothyann Peng, MD  carvedilol (COREG) 12.5 MG tablet TAKE 1 TABLET(12.5 MG) BY MOUTH TWICE DAILY WITH A MEAL 01/16/22   Dorothyann Peng, MD  carvedilol (COREG) 12.5 MG tablet TAKE 1 TABLET(12.5 MG) BY MOUTH TWICE DAILY WITH A MEAL 01/16/22   Dorothyann Peng, MD  chlorthalidone (HYGROTON) 25 MG tablet Take 12.5 mg by mouth daily. 10/12/19   [provider]  Cholecalciferol (VITAMIN D) 50 MCG (2000 UT) CAPS Take by mouth.    [provider]  clindamycin (CLEOCIN) 150 MG capsule For dentist appointments 01/15/21   [provider]  dipyridamole-aspirin (AGGRENOX) 200-25 MG 12hr capsule TAKE 1 CAPSULE BY MOUTH DAILY 03/14/21   Dorothyann Peng, MD  glimepiride (AMARYL) 1 MG tablet TAKE 1/2 TABLET BY MOUTH ON MONDAYS, WEDNESDAYS, AND FRIDAYS Patient not taking: Reported on 03/27/2022 01/16/22   Dorothyann Peng, MD  glucosamine-chondroitin 500-400 MG tablet Take 1 tablet by mouth 3 (three) times daily.    [provider]  glucose blood test strip Use as instructed to check blood sugars up to 3 times a day.  Dx code e11.65 05/17/19   Dorothyann Peng, MD  irbesartan (AVAPRO) 150 MG tablet Take 1 tablet (150 mg total) by mouth daily. 07/19/20 07/19/21  Dorothyann Peng, MD  JARDIANCE 10 MG TABS tablet Take 10 mg by mouth daily. 02/01/21   [provider]  Multiple Vitamin (MULTIVITAMIN) capsule Take 1 capsule by mouth daily.    [provider]  nitroGLYCERIN (NITROSTAT) 0.4 MG SL tablet PLACE 1 TABLET UNDER THE TONGUE AT THE FIRST SIGN OF ATTACK, MAY REPEAT EVERY 5 MINUTES FOR 3 DOSES IN 15 MINUTES. IF PAIN PERSISTS CALL 911 08/24/20   Dorothyann Peng, MD  predniSONE (STERAPRED UNI-PAK 21  TAB) 10 MG (21) TBPK tablet Take as directed Patient not taking: Reported on 01/27/2022 05/21/21   Tarry Kos, MD  rosuvastatin (CRESTOR) 5 MG tablet TAKE 1 TABLET BY MOUTH EVERY DAY MONDAY THROUGH FRIDAY. DO NOT TAKE ON SATURDAY AND SUNDAY Patient not taking: Reported on 03/27/2022 02/13/22   Dorothyann Peng, MD  tiZANidine (ZANAFLEX) 4 MG tablet One tab po qpm prn muscle spasms Patient not taking: Reported on 01/27/2022 09/03/21   Dorothyann Peng, MD      Allergies    Patient has no known allergies.    Review of Systems   Review of Systems A 10 point review of systems was performed and is negative unless otherwise reported in HPI.  Physical Exam Updated Vital Signs BP (!) 186/69   Pulse 67   Temp 98.1 F (36.7 C) (Oral)   Resp 13   Ht 5\' 4"  (1.626 m)   Wt 59 kg   SpO2 100%   BMI 22.31 kg/m  Physical Exam General: Normal appearing female, lying in bed.  HEENT: PERRLA, EOMI, no nystagmus, sclera anicteric, MMM, trachea midline.  Tongue protrudes midline. Cardiology: RRR, no murmurs/rubs/gallops. BL radial and DP pulses equal bilaterally.  Resp: Normal respiratory rate and effort. CTAB, no wheezes, rhonchi, crackles.  Abd: Soft, non-tender, non-distended. No rebound tenderness or guarding.  GU: Deferred. MSK: No peripheral edema or signs of trauma. Extremities without deformity or TTP. No cyanosis or clubbing. Skin: warm, dry.  Neuro: A&Ox4, CNs II-XII grossly intact.  4-5 strength in left upper extremity with weakness of handgrip.  She is holding her hand in a contracted manner unintentionally.  5 out of 5 strength in bilateral lower extremities with intact sensation. Range of motion in bilateral wrists is limited due to rheumatoid arthritis.  Sensation grossly intact.   Psych: Normal mood and affect.   ED Results / Procedures / Treatments   Labs (all labs ordered are listed, but only abnormal results are displayed) Labs Reviewed  COMPREHENSIVE METABOLIC PANEL - Abnormal;  Notable for the following components:      Result Value   Glucose, Bld 150 (*)    BUN 51 (*)    Creatinine, Ser 2.08 (*)    GFR, Estimated 22 (*)    All other components within normal limits  CBG MONITORING, ED - Abnormal; Notable for the following components:   Glucose-Capillary 129 (*)    All other components within normal limits  I-STAT CHEM 8, ED - Abnormal; Notable for the following components:   BUN 50 (*)    Creatinine, Ser 2.10 (*)    Glucose, Bld 151 (*)    All other components within normal limits  CBG MONITORING, ED - Abnormal; Notable for the following components:   Glucose-Capillary 128 (*)    All other components within normal limits  TROPONIN I (HIGH SENSITIVITY) -  Abnormal; Notable for the following components:   Troponin I (High Sensitivity) 53 (*)    All other components within normal limits  PROTIME-INR  APTT  CBC  DIFFERENTIAL  ETHANOL  TROPONIN I (HIGH SENSITIVITY)    EKG EKG Interpretation  Date/Time:  Wednesday August 06 2022 14:49:17 EDT Ventricular Rate:  80 PR Interval:  144 QRS Duration: 88 QT Interval:  374 QTC Calculation: 431 R Axis:   5 Text Interpretation: Normal sinus rhythm New TWIs in inferolateral leads Confirmed by Vivi Barrack [phone]) on 08/06/2022 4:23:29 PM  Radiology CT HEAD WO CONTRAST  Result Date: 08/06/2022 CLINICAL DATA:  Transient ischemic attack. EXAM: CT HEAD WITHOUT CONTRAST TECHNIQUE: Contiguous axial images were obtained from the base of the skull through the vertex without intravenous contrast. RADIATION DOSE REDUCTION: This exam was performed according to the departmental dose-optimization program which includes automated exposure control, adjustment of the mA and/or kV according to patient size and/or use of iterative reconstruction technique. COMPARISON:  None Available. FINDINGS: Brain: No acute hemorrhage. Patchy and confluent hypoattenuation of the cerebral white matter, most consistent with severe chronic  small-vessel disease. Cortical gray-white differentiation is preserved. No hydrocephalus or extra-axial collection. No mass effect or midline shift. Vascular: No hyperdense vessel or unexpected calcification. Skull: No calvarial fracture or suspicious bone lesion. Skull base is unremarkable. Sinuses/Orbits: Unremarkable. Other: None. IMPRESSION: 1. No acute intracranial abnormality. 2. Severe chronic small-vessel disease. Electronically Signed   By: Orvan Falconer M.D.   On: 08/06/2022 15:50    Procedures Procedures  {Document cardiac monitor, telemetry assessment procedure when appropriate:1}  Medications Ordered in ED Medications  sodium chloride flush (NS) 0.9 % injection 3 mL (3 mLs Intravenous Given 08/06/22 1547)    ED Course/ Medical Decision Making/ A&P                          Medical Decision Making Amount and/or Complexity of Data Reviewed Labs:  Decision-making details documented in ED Course. Radiology: ordered. Decision-making details documented in ED Course.    This patient presents to the ED for concern of left hand numbness/weakness, this involves an extensive number of treatment options, and is a complaint that carries with it a high risk of complications and morbidity.  I considered the following differential and admission for this acute, potentially life threatening condition.  Mildly hypertensive to 186/69, otherwise vitally stable and afebrile, well-appearing.  MDM:    Given the acute onset of neurological symptoms, stroke is the most concerning etiology of these acute symptoms. The neuro exam is significant for 4-5 strength in left hand grip and left upper extremity with holding hand contracted manner.  She states that her symptoms have already begun to improve, I have low concern for LVO at this point but I do have concern for acute ischemic CVA.  Concern for multiple strokes given possibility of increasing weakness in right lower extremity.  Will need CT head and MRI  of brain to further evaluate.  She has no back pain or trauma to indicate spinal cord compression.  Also consider electrolyte abnormalities such as hypercalcemia, or hypo-/hyperglycemia. She reports no CP or SOB but EKG on arrival does show new TWIs in inferolateral leads so will obtain troponin as well.   LKN: 0200 AM this morning - out of window for TNK Glucose 128 mg/dL   Clinical Course as of 08/06/22 1811  Wed Aug 06, 2022  1621 Glucose-Capillary(!): 128 wnl [HN]  1621 Alcohol,  Ethyl (B): <10 neg [HN]  1621 Comprehensive metabolic panel(!) C/w priors [HN]  1621 INR: 1.0 wnl [HN]  1621 CBC wnl [HN]  1621 CT HEAD WO CONTRAST NAICP [HN]  1810 Troponin I (High Sensitivity)(!): 53 Mildly elevated, will get repeat [HN]    Clinical Course User Index [HN] Loetta Rough, MD    Labs: I Ordered, and personally interpreted labs.  The pertinent results include:  those listed above  Imaging Studies ordered: I ordered imaging studies including CTH, MRI brain wo contrast I independently visualized and interpreted imaging. I agree with the radiologist interpretation  Additional history obtained from chart review, son at bedside.    Cardiac Monitoring: The patient was maintained on a cardiac monitor.  I personally viewed and interpreted the cardiac monitored which showed an underlying rhythm of: Normal sinus rhythm  Reevaluation: After the interventions noted above, I reevaluated the patient and found that they have :{resolved/improved/worsened:23923::"improved"}  Social Determinants of Health: ***  Disposition:  ***  Co morbidities that complicate the patient evaluation  Past Medical History:  Diagnosis Date   Cerebrovascular disease, unspecified    Coronary atherosclerosis of unspecified type of vessel, native or graft    Diabetes mellitus without complication (HCC)    Other and unspecified hyperlipidemia    Rheumatoid arthritis(714.0)    Shingles 2003   Unspecified  essential hypertension      Medicines Meds ordered this encounter  Medications   sodium chloride flush (NS) 0.9 % injection 3 mL    I have reviewed the patients home medicines and have made adjustments as needed  Problem List / ED Course: Problem List Items Addressed This Visit   None        {Document critical care time when appropriate:1} {Document review of labs and clinical decision tools ie heart score, Chads2Vasc2 etc:1}  {Document your independent review of radiology images, and any outside records:1} {Document your discussion with family members, caretakers, and with consultants:1} {Document social determinants of health affecting pt's care:1} {Document your decision making why or why not admission, treatments were needed:1}  This note was created using dictation software, which may contain spelling or grammatical errors.

## 2022-08-06 NOTE — ED Notes (Signed)
Pt to MRI

## 2022-08-06 NOTE — ED Provider Triage Note (Addendum)
Emergency Medicine Provider Triage Evaluation Note  [name] , a 89 y.o. female  was evaluated in triage.  Pt complains of numbness to the left hand that started around 2 AM.  No other complaints.  Has history of CVA, and rheumatoid arthritis.  Review of Systems  Positive: As above Negative: As above  Physical Exam  BP (!) 180/66 (BP Location: Right Arm)   Pulse 81   Temp 98.1 F (36.7 C) (Oral)   Resp 17   Ht 5\' 4"  (1.626 m)   Wt 59 kg   SpO2 99%   BMI 22.31 kg/m  Gen:   Awake, no distress   Resp:  Normal effort  MSK:   Moves extremities without difficulty  Other:  Cranial nerves III through XII intact.  Without pronator drift.  Symmetrical smile.  Normal speech.  5/5 strength in bilateral lower extremities.  Medical Decision Making  Medically screening exam initiated at 2:56 PM.  Appropriate orders placed.  [name] was informed that the remainder of the evaluation will be completed by another provider, this initial triage assessment does not replace that evaluation, and the importance of remaining in the ED until their evaluation is complete.  Last known normal was 2 AM.   [name], PA-C 08/06/22 1458    [name], PA-C 08/06/22 1458

## 2022-08-06 NOTE — Consult Note (Signed)
NEUROLOGY CONSULTATION NOTE   Date of service: August 06, 2022 Patient Name: [name] MRN:  [id number] DOB:  10-30-1927 Reason for consult: "LUE weakness" Requesting Provider: Loetta Rough, MD _ _ _   _ __   _ __ _ _  __ __   _ __   __ _  History of Present Illness  [name] is a 89 y.o. female with PMH significant for prior stroke in 2003 with no residual deficit, DM2, HTN, HLD, RA who presents with sudden onset L hand weakness. She went to bed at 1700 on 08/05/22 and woke up at 0200 on 08/06/22 with left upper ext weakness.  Hand dragging and had to use R hand to move it. Was able to hold it up at the elbow a bit thou.  No prior hx of similar symptoms. Reports a couple months ago, had RLE weakness but there was a lot of pain. She was told she has sciatica. With treatment, the pain improved, she still feels that there is a bit of weakness.  She had MRI Brain in the ED which was negative for acute stroke. Symptoms have improved but she is not back to her baseline and still has L hand weakness.  LKW: 1700 on 08/05/22. mRS: 3 tNKASE: not offered, outside window Thrombectomy: not offered, low suspicion for LVO. NIHSS components Score: Comment  1a Level of Conscious 0[x]  1[]  2[]  3[]      1b LOC Questions 0[x]  1[]  2[]       1c LOC Commands 0[x]  1[]  2[]       2 Best Gaze 0[x]  1[]  2[]       3 Visual 0[x]  1[]  2[]  3[]      4 Facial Palsy 0[x]  1[]  2[]  3[]      5a Motor Arm - left 0[]  1[x]  2[]  3[]  4[]  UN[]    5b Motor Arm - Right 0[x]  1[]  2[]  3[]  4[]  UN[]    6a Motor Leg - Left 0[x]  1[]  2[]  3[]  4[]  UN[]    6b Motor Leg - Right 0[x]  1[]  2[]  3[]  4[]  UN[]    7 Limb Ataxia 0[x]  1[]  2[]  3[]  UN[]     8 Sensory 0[x]  1[]  2[]  UN[]      9 Best Language 0[x]  1[]  2[]  3[]      10 Dysarthria 0[x]  1[]  2[]  UN[]      11 Extinct. and Inattention 0[x]  1[]  2[]       TOTAL: 1       ROS   Constitutional Denies weight loss, fever and chills.   HEENT Denies changes in vision and hearing.   Respiratory Denies  SOB and cough.   CV Denies palpitations and CP   GI Denies abdominal pain, nausea, vomiting and diarrhea.   GU Denies dysuria and urinary frequency.   MSK Denies myalgia and joint pain.   Skin Denies rash and pruritus.   Neurological Denies headache and syncope.   Psychiatric Denies recent changes in mood. Denies anxiety and depression.    Past History   Past Medical History:  Diagnosis Date   Cerebrovascular disease, unspecified    Coronary atherosclerosis of unspecified type of vessel, native or graft    Diabetes mellitus without complication (HCC)    Other and unspecified hyperlipidemia    Rheumatoid arthritis(714.0)    Shingles 2003   Unspecified essential hypertension    Past Surgical History:  Procedure Laterality Date   Cardiac Bypass  2003   CATARACT EXTRACTION, BILATERAL  2007   Family History  Problem Relation Age of Onset   Cancer Mother  Hypertension Father    Cancer Brother    Hypertension Paternal Grandmother    Heart disease Brother    Diabetes Neg Hx    Coronary artery disease Neg Hx    Social History   Socioeconomic History   Marital status: Widowed    Spouse name: Not on file   Number of children: Not on file   Years of education: Not on file   Highest education level: Not on file  Occupational History   Occupation: retired  Tobacco Use   Smoking status: Never   Smokeless tobacco: Never  Vaping Use   Vaping Use: Never used  Substance and Sexual Activity   Alcohol use: No   Drug use: No   Sexual activity: Not Currently  Other Topics Concern   Not on file  Social History Narrative   Retired   Tobacco use- no   Alcohol use-no         Social Determinants of Health   Financial Resource Strain: Low Risk  (03/27/2022)   Overall Financial Resource Strain (CARDIA)    Difficulty of Paying Living Expenses: Not hard at all  Food Insecurity: No Food Insecurity (03/27/2022)   Hunger Vital Sign    Worried About Running Out of Food in the Last  Year: Never true    Ran Out of Food in the Last Year: Never true  Transportation Needs: No Transportation Needs (03/27/2022)   PRAPARE - Administrator, Civil Service (Medical): No    Lack of Transportation (Non-Medical): No  Physical Activity: Inactive (03/27/2022)   Exercise Vital Sign    Days of Exercise per Week: 0 days    Minutes of Exercise per Session: 0 min  Stress: No Stress Concern Present (03/27/2022)   [employer] of Occupational Health - Occupational Stress Questionnaire    Feeling of Stress : Not at all  Social Connections: Moderately Integrated (11/14/2020)   Social Connection and Isolation Panel [NHANES]    Frequency of Communication with Friends and Family: More than three times a week    Frequency of Social Gatherings with Friends and Family: Once a week    Attends Religious Services: More than 4 times per year    Active Member of [employer] or Organizations: Yes    Attends Banker Meetings: Never    Marital Status: Widowed   No Known Allergies  Medications  (Not in a hospital admission)    Vitals   Vitals:   08/06/22 1930 08/06/22 2030 08/06/22 2145 08/06/22 2200  BP: (!) 145/57 (!) 180/69 (!) 165/62   Pulse: 63 66 67 70  Resp: 16 13 14    Temp:   97.9 F (36.6 C)   TempSrc:   Oral   SpO2: 99% 100% 99% 100%  Weight:      Height:         Body mass index is 22.31 kg/m.  Physical Exam   General: Laying comfortably in bed; in no acute distress.  HENT: Normal oropharynx and mucosa. Normal external appearance of ears and nose.  Neck: Supple, no pain or tenderness  CV: No JVD. No peripheral edema.  Pulmonary: Symmetric Chest rise. Normal respiratory effort.  Abdomen: Soft to touch, non-tender.  Ext: No cyanosis, edema, or deformity  Skin: No rash. Normal palpation of skin.   Musculoskeletal: Normal digits and nails by inspection. No clubbing.   Neurologic Examination  Mental status/Cognition: Alert, oriented to self, place, month  and year, good attention.  Speech/language: Fluent, comprehension intact,  object naming intact, repetition intact.  Cranial nerves:   CN II Pupils equal and reactive to light, no VF deficits    CN III,IV,VI EOM intact, no gaze preference or deviation, no nystagmus    CN V normal sensation in V1, V2, and V3 segments bilaterally    CN VII no asymmetry, no nasolabial fold flattening    CN VIII normal hearing to speech    CN IX & X normal palatal elevation, no uvular deviation    CN XI 5/5 head turn and 5/5 shoulder shrug bilaterally    CN XII midline tongue protrusion    Motor:  Muscle bulk: poor, tone normal, pronator drift noted in LUE. tremor none Mvmt Root Nerve  Muscle Right Left Comments  SA C5/6 Ax Deltoid 5 4   EF C5/6 Mc Biceps 5 4   EE C6/7/8 Rad Triceps 5 4   WF C6/7 Med FCR     WE C7/8 PIN ECU     F Ab C8/T1 U ADM/FDI 5 2   HF L1/2/3 Fem Illopsoas 4+ 4+   KE L2/3/4 Fem Quad 5 5   DF L4/5 D Peron Tib Ant 5 5   PF S1/2 Tibial Grc/Sol 5 5    Sensation:  Light touch Intact throughout   Pin prick    Temperature    Vibration   Proprioception    Coordination/Complex Motor:  - Finger to Nose intact BL - Heel to shin intact BLa - Rapid alternating movement are slowed BL - Gait: deferred for patient safety.  Labs   CBC:  Recent Labs  Lab 08/06/22 1500 08/06/22 1506  WBC 5.6  --   NEUTROABS 3.8  --   HGB 13.3 14.3  HCT 40.9 42.0  MCV 99.8  --   PLT 229  --     Basic Metabolic Panel:  Lab Results  Component Value Date   NA 143 08/06/2022   K 3.8 08/06/2022   CO2 25 08/06/2022   GLUCOSE 151 (H) 08/06/2022   BUN 50 (H) 08/06/2022   CREATININE 2.10 (H) 08/06/2022   CALCIUM 9.6 08/06/2022   GFRNONAA 22 (L) 08/06/2022   GFRAA 28 06/25/2020   Lipid Panel:  Lab Results  Component Value Date   LDLCALC 76 07/19/2020   HgbA1c:  Lab Results  Component Value Date   HGBA1C 6.8 (H) 07/17/2021   Urine Drug Screen: No results found for: "LABOPIA",  "COCAINSCRNUR", "LABBENZ", "AMPHETMU", "THCU", "LABBARB"  Alcohol Level     Component Value Date/Time   ETH <10 08/06/2022 1500    CT Head without contrast(Personally reviewed): CTH was negative for a large hypodensity concerning for a large territory infarct or hyperdensity concerning for an ICH  CT angio Head and Neck with contrast: pending  MRI Brain(Personally reviewed): No acute stroke  Impression   [name] is a 89 y.o. female with PMH significant for prior stroke in 2003 with no residual deficit, DM2, HTN, HLD, RA who presents with sudden onset L hand weakness. She went to bed at 1700 on 08/05/22 and woke up at 0200 on 08/06/22 with left upper ext weakness.  Her neurologic examination is notable for Lue weakness with no numbness.  Despite her MRI brain being negative, highest suspicion for MRI negative stroke. If this was a potential nerve compressio, would expect her to have both sensory and motor symptoms rather than just pure motor weakness.  In addition to stroke workup, will get MRI C spine to rule out a cord lesion  but overall low suspicion. Recommendations  - Frequent Neuro checks per stroke unit protocol - Recommend brain imaging with MRI Brain without contrast - Recommend Vascular imaging with MRA Angio Head without contrast and US Carotid doppler - Recommend obtaining TTE - Recommend obtaining Lipid panel with LDL - Please start statin if LDL > 70 - Recommend HbA1c to evaluate for diabetes and how well it is controlled. - Antithrombotic - Aspirin 81mg  daily. - Recommend DVT ppx - SBP goal - permissive hypertension first 24 h < 220/110. Held home meds.  - Recommend Telemetry monitoring for arrythmia - Recommend bedside swallow screen prior to PO intake. - Stroke education booklet - Recommend PT/OT/SLP consult - MRI C spine without contrast.  ______________________________________________________________________   Thank you for the opportunity to take  part in the care of this patient. If you have any further questions, please contact the neurology consultation attending.  Signed,  Erick Blinks [facility] _ _ _   _ __   _ __ _ _  __ __   _ __   __ _

## 2022-08-07 ENCOUNTER — Inpatient Hospital Stay (HOSPITAL_COMMUNITY): Payer: Medicare PPO

## 2022-08-07 ENCOUNTER — Encounter (HOSPITAL_COMMUNITY): Payer: Self-pay | Admitting: Internal Medicine

## 2022-08-07 ENCOUNTER — Emergency Department (HOSPITAL_COMMUNITY): Payer: Medicare PPO

## 2022-08-07 DIAGNOSIS — N179 Acute kidney failure, unspecified: Secondary | ICD-10-CM | POA: Diagnosis present

## 2022-08-07 DIAGNOSIS — R001 Bradycardia, unspecified: Secondary | ICD-10-CM | POA: Diagnosis not present

## 2022-08-07 DIAGNOSIS — M4802 Spinal stenosis, cervical region: Secondary | ICD-10-CM | POA: Diagnosis present

## 2022-08-07 DIAGNOSIS — E1122 Type 2 diabetes mellitus with diabetic chronic kidney disease: Secondary | ICD-10-CM | POA: Diagnosis present

## 2022-08-07 DIAGNOSIS — Z8673 Personal history of transient ischemic attack (TIA), and cerebral infarction without residual deficits: Secondary | ICD-10-CM | POA: Diagnosis present

## 2022-08-07 DIAGNOSIS — R29701 NIHSS score 1: Secondary | ICD-10-CM | POA: Diagnosis present

## 2022-08-07 DIAGNOSIS — M5416 Radiculopathy, lumbar region: Secondary | ICD-10-CM | POA: Diagnosis present

## 2022-08-07 DIAGNOSIS — Z993 Dependence on wheelchair: Secondary | ICD-10-CM | POA: Diagnosis not present

## 2022-08-07 DIAGNOSIS — I6389 Other cerebral infarction: Secondary | ICD-10-CM | POA: Diagnosis not present

## 2022-08-07 DIAGNOSIS — R531 Weakness: Secondary | ICD-10-CM | POA: Diagnosis not present

## 2022-08-07 DIAGNOSIS — Z8249 Family history of ischemic heart disease and other diseases of the circulatory system: Secondary | ICD-10-CM | POA: Diagnosis not present

## 2022-08-07 DIAGNOSIS — T447X5A Adverse effect of beta-adrenoreceptor antagonists, initial encounter: Secondary | ICD-10-CM | POA: Diagnosis not present

## 2022-08-07 DIAGNOSIS — I6300 Cerebral infarction due to thrombosis of unspecified precerebral artery: Secondary | ICD-10-CM

## 2022-08-07 DIAGNOSIS — I129 Hypertensive chronic kidney disease with stage 1 through stage 4 chronic kidney disease, or unspecified chronic kidney disease: Secondary | ICD-10-CM | POA: Diagnosis present

## 2022-08-07 DIAGNOSIS — E871 Hypo-osmolality and hyponatremia: Secondary | ICD-10-CM | POA: Diagnosis not present

## 2022-08-07 DIAGNOSIS — R1310 Dysphagia, unspecified: Secondary | ICD-10-CM | POA: Diagnosis present

## 2022-08-07 DIAGNOSIS — E785 Hyperlipidemia, unspecified: Secondary | ICD-10-CM | POA: Diagnosis present

## 2022-08-07 DIAGNOSIS — I2510 Atherosclerotic heart disease of native coronary artery without angina pectoris: Secondary | ICD-10-CM | POA: Diagnosis present

## 2022-08-07 DIAGNOSIS — G8324 Monoplegia of upper limb affecting left nondominant side: Secondary | ICD-10-CM | POA: Diagnosis present

## 2022-08-07 DIAGNOSIS — M069 Rheumatoid arthritis, unspecified: Secondary | ICD-10-CM | POA: Diagnosis present

## 2022-08-07 DIAGNOSIS — M109 Gout, unspecified: Secondary | ICD-10-CM | POA: Diagnosis present

## 2022-08-07 DIAGNOSIS — I639 Cerebral infarction, unspecified: Secondary | ICD-10-CM | POA: Diagnosis present

## 2022-08-07 DIAGNOSIS — Z7984 Long term (current) use of oral hypoglycemic drugs: Secondary | ICD-10-CM | POA: Diagnosis not present

## 2022-08-07 DIAGNOSIS — R54 Age-related physical debility: Secondary | ICD-10-CM | POA: Diagnosis present

## 2022-08-07 DIAGNOSIS — M1611 Unilateral primary osteoarthritis, right hip: Secondary | ICD-10-CM | POA: Diagnosis present

## 2022-08-07 DIAGNOSIS — I10 Essential (primary) hypertension: Secondary | ICD-10-CM | POA: Diagnosis not present

## 2022-08-07 DIAGNOSIS — Z79899 Other long term (current) drug therapy: Secondary | ICD-10-CM | POA: Diagnosis not present

## 2022-08-07 DIAGNOSIS — N184 Chronic kidney disease, stage 4 (severe): Secondary | ICD-10-CM | POA: Diagnosis present

## 2022-08-07 DIAGNOSIS — I25810 Atherosclerosis of coronary artery bypass graft(s) without angina pectoris: Secondary | ICD-10-CM | POA: Diagnosis present

## 2022-08-07 DIAGNOSIS — R200 Anesthesia of skin: Secondary | ICD-10-CM | POA: Diagnosis present

## 2022-08-07 LAB — COMPREHENSIVE METABOLIC PANEL
ALT: 9 U/L (ref 0–44)
AST: 18 U/L (ref 15–41)
Albumin: 2.8 g/dL — ABNORMAL LOW (ref 3.5–5.0)
Alkaline Phosphatase: 51 U/L (ref 38–126)
Anion gap: 8 (ref 5–15)
BUN: 43 mg/dL — ABNORMAL HIGH (ref 8–23)
CO2: 23 mmol/L (ref 22–32)
Calcium: 8.1 mg/dL — ABNORMAL LOW (ref 8.9–10.3)
Chloride: 110 mmol/L (ref 98–111)
Creatinine, Ser: 1.71 mg/dL — ABNORMAL HIGH (ref 0.44–1.00)
GFR, Estimated: 28 mL/min — ABNORMAL LOW (ref >60)
Glucose, Bld: 87 mg/dL (ref 70–99)
Potassium: 3.4 mmol/L — ABNORMAL LOW (ref 3.5–5.1)
Sodium: 141 mmol/L (ref 135–145)
Total Bilirubin: 0.7 mg/dL (ref 0.3–1.2)
Total Protein: 5.3 g/dL — ABNORMAL LOW (ref 6.5–8.1)

## 2022-08-07 LAB — ECHOCARDIOGRAM COMPLETE
AR max vel: 1.61 cm2
AV Area VTI: 1.83 cm2
AV Area mean vel: 1.78 cm2
AV Mean grad: 4 mmHg
AV Peak grad: 8.6 mmHg
Ao pk vel: 1.47 m/s
Area-P 1/2: 2.29 cm2
Height: 64 in
MV M vel: 5.95 m/s
MV Peak grad: 141.6 mmHg
S' Lateral: 3 cm
Weight: 2080 oz

## 2022-08-07 LAB — GLUCOSE, CAPILLARY
Glucose-Capillary: 110 mg/dL — ABNORMAL HIGH (ref 70–99)
Glucose-Capillary: 119 mg/dL — ABNORMAL HIGH (ref 70–99)
Glucose-Capillary: 235 mg/dL — ABNORMAL HIGH (ref 70–99)
Glucose-Capillary: 138 mg/dL — ABNORMAL HIGH (ref 70–99)

## 2022-08-07 LAB — CBC
HCT: 33.5 % — ABNORMAL LOW (ref 36.0–46.0)
Hemoglobin: 11 g/dL — ABNORMAL LOW (ref 12.0–15.0)
MCH: 32.3 pg (ref 26.0–34.0)
MCHC: 32.8 g/dL (ref 30.0–36.0)
MCV: 98.2 fL (ref 80.0–100.0)
Platelets: 210 10*3/uL (ref 150–400)
RBC: 3.41 MIL/uL — ABNORMAL LOW (ref 3.87–5.11)
RDW: 11.9 % (ref 11.5–15.5)
WBC: 5.2 10*3/uL (ref 4.0–10.5)
nRBC: 0 % (ref 0.0–0.2)

## 2022-08-07 LAB — LIPID PANEL
Cholesterol: 142 mg/dL (ref 0–200)
HDL: 27 mg/dL — ABNORMAL LOW (ref >40)
LDL Cholesterol: 91 mg/dL (ref 0–99)
Total CHOL/HDL Ratio: 5.3 RATIO
Triglycerides: 119 mg/dL (ref <150)
VLDL: 24 mg/dL (ref 0–40)

## 2022-08-07 LAB — LDL CHOLESTEROL, DIRECT: Direct LDL: 88 mg/dL (ref 0–99)

## 2022-08-07 LAB — HEMOGLOBIN A1C
Hgb A1c MFr Bld: 6.4 % — ABNORMAL HIGH (ref 4.8–5.6)
Mean Plasma Glucose: 136.98 mg/dL

## 2022-08-07 MED ORDER — ROSUVASTATIN CALCIUM 5 MG PO TABS: 10.0000 mg | ORAL_TABLET | Freq: Every day | ORAL | Status: DC

## 2022-08-07 MED ORDER — ASPIRIN 81 MG PO CHEW
81.0000 mg | CHEWABLE_TABLET | Freq: Every day | ORAL | Status: DC
  Administered 2022-08-07 – 2022-08-09 (×3): 81 mg via ORAL
  Filled 2022-08-07 (×3): qty 1

## 2022-08-07 MED ORDER — CLOPIDOGREL BISULFATE 75 MG PO TABS
75.0000 mg | ORAL_TABLET | Freq: Every day | ORAL | Status: DC
  Administered 2022-08-07 – 2022-08-13 (×7): 75 mg via ORAL
  Filled 2022-08-07 (×7): qty 1

## 2022-08-07 MED ORDER — INSULIN ASPART 100 UNIT/ML IJ SOLN
0.0000 [IU] | Freq: Three times a day (TID) | INTRAMUSCULAR | Status: DC
  Administered 2022-08-08 – 2022-08-09 (×2): 1 [IU] via SUBCUTANEOUS

## 2022-08-07 MED ORDER — POLYETHYLENE GLYCOL 3350 17 G PO PACK: 17.0000 g | PACK | Freq: Every day | ORAL | Status: DC | PRN

## 2022-08-07 MED ORDER — LACTATED RINGERS IV SOLN: INTRAVENOUS | Status: AC

## 2022-08-07 MED ORDER — ROSUVASTATIN CALCIUM 20 MG PO TABS
20.0000 mg | ORAL_TABLET | Freq: Every day | ORAL | Status: DC
  Administered 2022-08-07 – 2022-08-13 (×7): 20 mg via ORAL
  Filled 2022-08-07 (×7): qty 1

## 2022-08-07 MED ORDER — ACETAMINOPHEN 325 MG PO TABS
650.0000 mg | ORAL_TABLET | Freq: Four times a day (QID) | ORAL | Status: DC | PRN
  Administered 2022-08-07 – 2022-08-13 (×6): 650 mg via ORAL
  Filled 2022-08-07 (×6): qty 2

## 2022-08-07 MED ORDER — ACETAMINOPHEN 650 MG RE SUPP: 650.0000 mg | Freq: Four times a day (QID) | RECTAL | Status: DC | PRN

## 2022-08-07 MED ORDER — ENOXAPARIN SODIUM 30 MG/0.3ML IJ SOSY
30.0000 mg | PREFILLED_SYRINGE | INTRAMUSCULAR | Status: DC
  Administered 2022-08-07 – 2022-08-12 (×6): 30 mg via SUBCUTANEOUS
  Filled 2022-08-07 (×6): qty 0.3

## 2022-08-07 MED ORDER — LIDOCAINE 5 % EX PTCH
1.0000 | MEDICATED_PATCH | Freq: Every day | CUTANEOUS | Status: DC
  Administered 2022-08-07 – 2022-08-12 (×7): 1 via TRANSDERMAL
  Filled 2022-08-07 (×7): qty 1

## 2022-08-07 MED ORDER — HYDRALAZINE HCL 20 MG/ML IJ SOLN: 10.0000 mg | Freq: Three times a day (TID) | INTRAMUSCULAR | Status: DC | PRN

## 2022-08-07 NOTE — Evaluation (Signed)
Clinical/Bedside Swallow Evaluation Patient Details  Name: [name] MRN: [id number] Date of Birth: 01-02-1928  Today's Date: 08/07/2022 Time: SLP Start Time (ACUTE ONLY): 0847 SLP Stop Time (ACUTE ONLY): 0854 SLP Time Calculation (min) (ACUTE ONLY): 7 min  Past Medical History:  Past Medical History:  Diagnosis Date   Cerebrovascular disease, unspecified    Coronary atherosclerosis of unspecified type of vessel, native or graft    CVA (cerebral vascular accident) (HCC)    Diabetes mellitus without complication (HCC)    Other and unspecified hyperlipidemia    Rheumatoid arthritis(714.0)    Shingles 2003   Unspecified essential hypertension    Past Surgical History:  Past Surgical History:  Procedure Laterality Date   Cardiac Bypass  2003   CATARACT EXTRACTION, BILATERAL  20018   HPI:  89 year old female medical history significant for CVA in 2003, CAD status post CABG, hyperlipidemia, hypertension, CKD stage IV, lumbar radiculopathy, diabetes mellitus type 2 non-insulin-dependent and closed wedge compression fracture of L1 vertebra with routine healing presented to [facility] emergency department on 08/06/2022 with complaining of left-sided upper arm weakness. CT head no acute intracranial abnormality and severe chronic small vessel disease. MRI no acute intracranial abnormality.    Assessment / Plan / Recommendation  Clinical Impression  Pt alert, pleasant and denies dysphagia with prior stroke or currently. She had one mild cough after consecutive sips thin however with smaller sips then consecutive straw drinking there were no further s/s aspiration noted. Vocal quality was clear. Oral manipulation and mastication with cracker was timely and efficient without residue. Recommend pt continue regular texture, thin liquids, pills with thin. No further ST needed. SLP Visit Diagnosis: Dysphagia, unspecified (R13.10)    Aspiration Risk  No limitations    Diet Recommendation Regular;Thin  liquid   Liquid Administration via: Straw;Cup Medication Administration: Whole meds with liquid Supervision: Patient able to self feed (may need assist with left sided weakness) Postural Changes: Seated upright at 90 degrees    Other  Recommendations Oral Care Recommendations: Oral care BID    Recommendations for follow up therapy are one component of a multi-disciplinary discharge planning process, led by the attending physician.  Recommendations may be updated based on patient status, additional functional criteria and insurance authorization.  Follow up Recommendations No SLP follow up      Assistance Recommended at Discharge    Functional Status Assessment Patient has had a recent decline in their functional status and demonstrates the ability to make significant improvements in function in a reasonable and predictable amount of time.  Frequency and Duration            Prognosis        Swallow Study   General Date of Onset: 08/06/22 HPI: 89 year old female medical history significant for CVA in 2003, CAD status post CABG, hyperlipidemia, hypertension, CKD stage IV, lumbar radiculopathy, diabetes mellitus type 2 non-insulin-dependent and closed wedge compression fracture of L1 vertebra with routine healing presented to [facility] emergency department on 08/06/2022 with complaining of left-sided upper arm weakness. CT head no acute intracranial abnormality and severe chronic small vessel disease. MRI no acute intracranial abnormality. Type of Study: Bedside Swallow Evaluation Previous Swallow Assessment:  (none) Diet Prior to this Study: Regular;Thin liquids (Level 0) Temperature Spikes Noted: No Respiratory Status: Room air History of Recent Intubation: No Behavior/Cognition: Alert;Cooperative;Pleasant mood Oral Cavity Assessment: Within Functional Limits Oral Care Completed by SLP: No Oral Cavity - Dentition: Dentures, bottom;Dentures, top Vision: Functional for  self-feeding Self-Feeding Abilities: Able to  feed self (may need assist with possible left side weakness>) Patient Positioning: Upright in bed Baseline Vocal Quality: Normal Volitional Cough: Weak Volitional Swallow: Able to elicit    Oral/Motor/Sensory Function Overall Oral Motor/Sensory Function: Within functional limits   Ice Chips Ice chips: Not tested   Thin Liquid Thin Liquid: Within functional limits Presentation: Straw    Nectar Thick Nectar Thick Liquid: Not tested   Honey Thick Honey Thick Liquid: Not tested   Puree Puree: Within functional limits   Solid     Solid: Within functional limits      [name] 08/07/2022,9:04 AM

## 2022-08-07 NOTE — Progress Notes (Signed)
Seen after midnight-agree with HPI-additional thoughts as below  89 year old black female, predominantly wheelchair-bound since 09/2021 Distant CABG 2003 ask for Dr. 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 Linna Caprice- Underlying CKD 4 baseline creatinine 2.4 DM TY 2 Prior pression fracture L1 07/2021 Dr. Ophelia Charter follows Peripheral arterial disease?  Follows with Dr. Linden Dolin good flow on noninvasive testing as of 08/2020 (ABI  R1.4, L1.1)  She presented with sudden left hand weakness at around 2 AM on 6/19 with left upper extremity weakness noticed her hand was dragging had to use right hand-has previously been told she has right lower extremity weakness with sciatica-seen by neurology who recommended admission-note taken off aspirin about 2 months prior by PCP after having been on it   S Looks well feels okay still complains of weakness which is persistent in the left hand-tells me quit driving several months ago usually gets around in wheelchair Lives alone although patient's son lives right next door to her She has another son who lives in [place] No chest pain no fever no nausea no vomiting  O BP (!) 166/63 (BP Location: Right Arm)   Pulse (!) 58   Temp 98.6 F (37 C) (Oral)   Resp 14   Ht 5\' 4"  (1.626 m)   Wt 59 kg   SpO2 100%   BMI 22.31 kg/m  Looks significantly younger than her age, good dentition Chest clear no wheeze rales rhonchi S1-S2 no murmur Abdomen soft no rebound Power 5/5 lower extremities   PFollow-up with neurology to have a small right hemispheric cortical infarct not visualized on MRI MRCP spine shows no spinal stenosis Neurology is recommending ASA/Plavix She will probably require skilled level care   Signed on 6/21---see todays full note--to follow  Pleas Koch, MD [facility] 8:57 AM

## 2022-08-07 NOTE — Evaluation (Addendum)
Occupational Therapy Evaluation Patient Details Name: [name] MRN: [id number] DOB: 02/20/1927 Today's Date: 08/07/2022   History of Present Illness Patient is a 89 year old female who presented to [facility] emergency department on 08/06/2022 with complaining of left-sided upper arm weakness. CT head no acute intracranial abnormality and severe chronic small vessel disease. MRI no acute intracranial abnormality. medical history significant for CVA in 2003, CAD status post CABG, hyperlipidemia, hypertension, CKD stage IV, lumbar radiculopathy, diabetes mellitus type 2 non-insulin-dependent and closed wedge compression fracture of L1 vertebra with routine healing.   Clinical Impression   Pt currently at mod to max assist for selfcare tasks sit to stand and for functional transfers using the RW for support.  She exhibits increased distal LUE weakness which may be indicative of radial nerve impairment as more weakness noted in wrist and digit extensors.  She was living alone and has some assist from her son, who is still working per her report.  She also had an aide one day a week for approximately 6 hours.  She admittedly states she needs more help.  Feel she will benefit from acute care OT at this time to help increase LUE strengthening and increase ADL independence.  Feel patient will benefit from continued inpatient follow up therapy, <3 hours/day.         Recommendations for follow up therapy are one component of a multi-disciplinary discharge planning process, led by the attending physician.  Recommendations may be updated based on patient status, additional functional criteria and insurance authorization.   Assistance Recommended at Discharge Frequent or constant Supervision/Assistance  Patient can return home with the following A lot of help with walking and/or transfers;A lot of help with bathing/dressing/bathroom;Assist for transportation;Help with stairs or ramp for entrance;Direct  supervision/assist for financial management;Assistance with cooking/housework    Functional Status Assessment  Patient has had a recent decline in their functional status and demonstrates the ability to make significant improvements in function in a reasonable and predictable amount of time.  Equipment Recommendations  Other (comment) (TBD next venue of care)       Precautions / Restrictions Precautions Precautions: Fall Precaution Comments: LUE weakness more distal then proximal Restrictions Weight Bearing Restrictions: No      Mobility Bed Mobility Overal bed mobility: Needs Assistance Bed Mobility: Supine to Sit     Supine to sit: Min assist     General bed mobility comments: Min assist for bringing trunk up to sitting and scooting to the EOB.    Transfers Overall transfer level: Needs assistance Equipment used: Rolling walker (2 wheels) Transfers: Sit to/from Stand, Bed to chair/wheelchair/BSC Sit to Stand: Mod assist     Step pivot transfers: Mod assist     General transfer comment: increased trunk flexion with decreased efficiency advancing and supporting weightbearing in LEs.  Scissoring with LLE placement.      Balance Overall balance assessment: Needs assistance Sitting-balance support: Feet supported Sitting balance-[name]: Fair     Standing balance support: During functional activity Standing balance-[name]: Poor Standing balance comment: Needs UE support and therapist assist for balance in standing and with transfers.                           ADL either performed or assessed with clinical judgement   ADL Overall ADL's : Needs assistance/impaired Eating/Feeding: Supervision/ safety;Sitting Eating/Feeding Details (indicate cue type and reason): RUE Grooming: Wash/dry hands;Wash/dry face;Set up;Sitting       Lower  Body Bathing: Moderate assistance;Sit to/from stand       Lower Body Dressing: Maximal assistance;Sit to/from  stand Lower Body Dressing Details (indicate cue type and reason): for donning gripper socks Toilet Transfer: Moderate assistance;Stand-pivot;Rolling walker (2 wheels) Toilet Transfer Details (indicate cue type and reason): simulated to bedside recliner Toileting- Clothing Manipulation and Hygiene: Maximal assistance;Sit to/from stand       Functional mobility during ADLs: Moderate assistance;Rolling walker (2 wheels) (short distance mobility) General ADL Comments: Pt with more distal weakness in the left wrist and digit extensors indicative of possible radial nerve palsy.  She is able to activate extension of the wrist and digits with slightly more digit extension noted in the first two digits compared to the other 3.  Severe flexed posture in standing at trunk and head.  Unable to achieve upright positioning.  She demonstrated decreased ability to support weight over the RLE when stepping with the left.  Increased scissoring also noted when placing the RLE.  Pt verbally educated on working on [facility] for digit extension of the left hand and for wrist extension.  She was able to return demonstrate for a few reps.     Vision Baseline Vision/History: 1 Wears glasses Ability to See in Adequate Light: 0 Adequate Patient Visual Report: Blurring of vision Additional Comments: Did not assess vision.  Pt reports not having glasses and noticing more blurring.  She reports having her yearly visit scheduled soon.  She reports no double vision or other issues.     Perception  Not tested   Praxis  Not tested    Pertinent Vitals/Pain Pain Assessment Pain Assessment: No/denies pain     Hand Dominance Right   Extremity/Trunk Assessment Upper Extremity Assessment Upper Extremity Assessment: LUE deficits/detail LUE Deficits / Details: AROM shoulder flexion 0-90 degrees, strength 3+/5, elbow flexors and extensors 3+/5,  wrist flexors 3+5, wrist extensors 2+/5, grasp 3/5 with digit extension 2-/5. LUE  Sensation: decreased light touch (pt reports being able to feel light touch but has increased numbness on the tips of all digits in the left hand.)   Lower Extremity Assessment Lower Extremity Assessment: Defer to PT evaluation   Cervical / Trunk Assessment Cervical / Trunk Assessment: Kyphotic   Communication Communication Communication: No difficulties   Cognition Arousal/Alertness: Awake/alert Behavior During Therapy: WFL for tasks assessed/performed Overall Cognitive Status: Within Functional Limits for tasks assessed                                 General Comments: Pt is aware of her deficits and that she needs more assistance than she currently has.                Home Living Family/patient expects to be discharged to:: Private residence Living Arrangements: Alone Available Help at Discharge: Available PRN/intermittently;Personal care attendant (Son lives next door but he still works some.  She has an aide on Thursdays 10:00-4:00 am) Type of Home: House Home Access: Stairs to enter;Ramped entrance     Home Layout: Two level;Able to live on main level with bedroom/bathroom     Bathroom Shower/Tub: Producer, television/film/video: Standard     Home Equipment: Wheelchair - Forensic psychologist (2 wheels);Rollator (4 wheels);Cane - single point;Cane - quad;Shower seat;BSC/3in1   Additional Comments: Gets meals on wheels mon-freiday      Prior Functioning/Environment Prior Level of Function : Needs assist  Mobility Comments: Recently has been using the wheelchair to get around. With integration of the rollator during short distances and transfers ADLs Comments: Aide assists with bathe and now she needs assist with dressing        OT Problem List: Decreased strength;Decreased range of motion;Impaired balance (sitting and/or standing);Pain;Decreased activity tolerance;Decreased coordination;Decreased knowledge of use of DME or AE       OT Treatment/Interventions: Self-care/ADL training;Patient/family education;Balance training;Splinting;Neuromuscular education;Therapeutic exercise;Therapeutic activities;DME and/or AE instruction    OT Goals(Current goals can be found in the care plan section) Acute Rehab OT Goals Patient Stated Goal: Pt hopes to get back to where she was before and being able to stay at her house. OT Goal Formulation: With patient Time For Goal Achievement: 08/21/22 Potential to Achieve Goals: Good ADL Goals Pt Will Perform Eating: with adaptive utensils;with modified independence Pt Will Perform Grooming: with supervision;standing (two tasks) Pt Will Perform Lower Body Bathing: with min assist;sit to/from stand Pt Will Perform Lower Body Dressing: with mod assist;sit to/from stand Pt Will Transfer to Toilet: with min assist;ambulating;bedside commode Pt Will Perform Toileting - Clothing Manipulation and hygiene: with min assist;sit to/from stand Pt/caregiver will Perform Home Exercise Program: Increased strength;Increased ROM;Right Upper extremity;With written HEP provided;With Supervision Additional ADL Goal #1: Pt will complete supine to sit EOB with supervision in preparation for selfcare tasks.  OT Frequency: Min 2X/week       AM-PAC OT "6 Clicks" Daily Activity     Outcome Measure Help from another person eating meals?: A Little Help from another person taking care of personal grooming?: A Little Help from another person toileting, which includes using toliet, bedpan, or urinal?: A Lot Help from another person bathing (including washing, rinsing, drying)?: A Lot Help from another person to put on and taking off regular upper body clothing?: A Lot Help from another person to put on and taking off regular lower body clothing?: A Lot 6 Click Score: 14   End of Session Equipment Utilized During Treatment: Gait belt;Rolling walker (2 wheels) Nurse Communication: Mobility status  Activity  Tolerance: Patient tolerated treatment well Patient left: in chair;with call bell/phone within reach;with chair alarm set  OT Visit Diagnosis: Unsteadiness on feet (R26.81);Muscle weakness (generalized) (M62.81)                Time: 2956-2130 OT Time Calculation (min): 55 min Charges:  OT General Charges $OT Visit: 1 Visit OT Evaluation $OT Eval Moderate Complexity: 1 Mod OT Treatments $Self Care/Home Management : 38-52 mins  Perrin Maltese, OTR/L Acute Rehabilitation Services  Office [phone] 08/07/2022

## 2022-08-07 NOTE — Progress Notes (Signed)
VASCULAR LAB    Carotid duplex has been performed.  See CV proc for preliminary results.   Trevontae Lindahl, RVT 08/07/2022, 11:04 AM

## 2022-08-07 NOTE — H&P (Addendum)
History and Physical    [name] ZOX:[id number] DOB: 25-Dec-1927 DOA: 08/06/2022  DOS: the patient was seen and examined on 08/07/2022  PCP: Nona Dell, NP   Patient coming from: Home  I have personally briefly reviewed patient's old medical records in [facility]   HPI: This is a 89 year old female medical history significant for CVA in 2003, CAD status post CABG, hyperlipidemia, hypertension, CKD stage IV, lumbar radiculopathy, diabetes mellitus type 2 non-insulin-dependent and closed wedge compression fracture of L1 vertebra with routine healing presented to [facility] emergency department on 08/06/2022 with complaining of left-sided upper arm weakness. History obtained both from patient and her son at bedside.   Patient reported she went to the bed at around [facility] on 08/05/2022 and woke up in the middle of the night around 2-3 AM feeling left-sided upper extremity weakness.  She was able to move right side of her extremity but left side continues to feel weak.  She denies any dizziness, headache, blurry vision, change of speech, fall, syncope, palpitation, chest pain and shortness of breath.  Patient also complaining about right-sided knee joint pain for last few days.  At the baseline she able to walk and ambulate by herself with an help of a walker at home.  ED Course: At the ED on initial presentation pressure found elevated 180/66, respiratory 17, heart rate 81, O2 sat 99%, and normal temperature.  EKG unremarkable. CT head no acute intracranial abnormality and severe chronic small vessel disease. As patient continues to have left-sided upper extremity weakness with negative CT head neurology has been consulted.  Patient is out of window for tPA. Neurology recommended inpatient admission for stroke workup.   Review of Systems:  Review of Systems  Constitutional:  Negative for chills and fever.  HENT:  Negative for hearing loss.   Respiratory:  Negative for cough.    Cardiovascular:  Negative for chest pain, palpitations and leg swelling.  Gastrointestinal:  Negative for nausea.  Musculoskeletal:  Negative for myalgias and neck pain.       Right-sided knee joint pain  Neurological:  Positive for focal weakness. Negative for dizziness, tingling, speech change, loss of consciousness and headaches.    Past Medical History:  Diagnosis Date   Cerebrovascular disease, unspecified    Coronary atherosclerosis of unspecified type of vessel, native or graft    CVA (cerebral vascular accident) (HCC)    Diabetes mellitus without complication (HCC)    Other and unspecified hyperlipidemia    Rheumatoid arthritis(714.0)    Shingles 2003   Unspecified essential hypertension     Past Surgical History:  Procedure Laterality Date   Cardiac Bypass  2003   CATARACT EXTRACTION, BILATERAL  2007     reports that she has never smoked. She has never used smokeless tobacco. She reports that she does not drink alcohol and does not use drugs.  No Known Allergies  Family History  Problem Relation Age of Onset   Cancer Mother    Hypertension Father    Cancer Brother    Hypertension Paternal Grandmother    Heart disease Brother    Diabetes Neg Hx    Coronary artery disease Neg Hx     Prior to Admission medications   Medication Sig Start Date End Date Taking? Authorizing Provider  acetaminophen (TYLENOL) 650 MG CR tablet Take 650-1,300 mg by mouth every 8 (eight) hours as needed for pain.   Yes [provider]  amLODipine (NORVASC) 5 MG tablet TAKE 1 TABLET(5  MG) BY MOUTH DAILY 10/14/21  Yes Dorothyann Peng, MD  carvedilol (COREG) 12.5 MG tablet TAKE 1 TABLET(12.5 MG) BY MOUTH TWICE DAILY WITH A MEAL 01/16/22  Yes Dorothyann Peng, MD  chlorthalidone (HYGROTON) 25 MG tablet Take 12.5 mg by mouth daily. 10/12/19  Yes [provider]  fluticasone (FLONASE) 50 MCG/ACT nasal spray Place 2 sprays into both nostrils daily. 06/03/22  Yes [provider]  predniSONE (DELTASONE) 5 MG tablet Take 5 mg by mouth daily. 04/21/22  Yes [provider]  traMADol (ULTRAM) 50 MG tablet Take 25 mg by mouth 2 (two) times daily. 04/21/22  Yes [provider]  Accu-Chek Softclix Lancets lancets Use as instructed to check blood sugars up to 3 times a day  Dx code e11.65 05/17/19   Dorothyann Peng, MD  Blood Glucose Calibration (ACCU-CHEK AVIVA) SOLN Use with accu-check aviva machine 06/02/19   Dorothyann Peng, MD  Blood Glucose Monitoring Suppl (ACCU-CHEK AVIVA PLUS) w/Device KIT Use to check  Blood sugars up to 3 times a day.  Dx code e11.65 12/10/20   Dorothyann Peng, MD  Cholecalciferol (VITAMIN D) 50 MCG (2000 UT) CAPS Take by mouth.    [provider]  clindamycin (CLEOCIN) 150 MG capsule For dentist appointments 01/15/21   [provider]  dipyridamole-aspirin (AGGRENOX) 200-25 MG 12hr capsule TAKE 1 CAPSULE BY MOUTH DAILY 03/14/21   Dorothyann Peng, MD  glimepiride (AMARYL) 1 MG tablet TAKE 1/2 TABLET BY MOUTH ON MONDAYS, WEDNESDAYS, AND FRIDAYS Patient not taking: Reported on 03/27/2022 01/16/22   Dorothyann Peng, MD  glucosamine-chondroitin 500-400 MG tablet Take 1 tablet by mouth 3 (three) times daily.    [provider]  glucose blood test strip Use as instructed to check blood sugars up to 3 times a day.  Dx code e11.65 05/17/19   Dorothyann Peng, MD  irbesartan (AVAPRO) 150 MG tablet Take 1 tablet (150 mg total) by mouth daily. 07/19/20 07/19/21  Dorothyann Peng, MD  JARDIANCE 10 MG TABS tablet Take 10 mg by mouth daily. 02/01/21   [provider]  Multiple Vitamin (MULTIVITAMIN) capsule Take 1 capsule by mouth daily.    [provider]  nitroGLYCERIN (NITROSTAT) 0.4 MG SL tablet PLACE 1 TABLET UNDER THE TONGUE AT THE FIRST SIGN OF ATTACK, MAY REPEAT EVERY 5 MINUTES FOR 3 DOSES IN 15 MINUTES. IF PAIN PERSISTS CALL 911 08/24/20   Dorothyann Peng, MD  predniSONE (STERAPRED UNI-PAK 21 TAB) 10 MG (21) TBPK  tablet Take as directed Patient not taking: Reported on 01/27/2022 05/21/21   Tarry Kos, MD  rosuvastatin (CRESTOR) 5 MG tablet TAKE 1 TABLET BY MOUTH EVERY DAY MONDAY THROUGH FRIDAY. DO NOT TAKE ON SATURDAY AND SUNDAY Patient not taking: Reported on 03/27/2022 02/13/22   Dorothyann Peng, MD  tiZANidine (ZANAFLEX) 4 MG tablet One tab po qpm prn muscle spasms Patient not taking: Reported on 01/27/2022 09/03/21   Dorothyann Peng, MD    Physical Exam: Vitals:   08/06/22 2200 08/07/22 0030 08/07/22 0130 08/07/22 0330  BP:  (!) 162/67 (!) 170/67 (!) 159/60  Pulse: 70 67 66 74  Resp:  14 16   Temp:   97.9 F (36.6 C)   TempSrc:   Oral   SpO2: 100% 98% 99% 100%  Weight:      Height:        Physical Exam Constitutional:      General: She is not in acute distress.    Appearance: She is not ill-appearing.  HENT:  Mouth/Throat:     Mouth: Mucous membranes are moist.  Cardiovascular:     Rate and Rhythm: Normal rate and regular rhythm.     Pulses: Normal pulses.     Heart sounds: Normal heart sounds.  Pulmonary:     Effort: Pulmonary effort is normal.     Breath sounds: Normal breath sounds.  Abdominal:     General: Bowel sounds are normal.  Musculoskeletal:        General: No swelling.     Comments: Right-sided knee joint pain on the medial side with movement and palpation.  No external erythema, swelling, rash and injury.  Skin:    General: Skin is warm.     Capillary Refill: Capillary refill takes less than 2 seconds.  Neurological:     Mental Status: She is alert.     Comments: Right-sided upper extremity strength 5/5.  Left-sided upper extremity strength 2/5.  Bilateral lower extremities strength 4/5  Psychiatric:        Mood and Affect: Mood normal.        Thought Content: Thought content normal.      Labs on Admission: I have personally reviewed following labs and imaging studies  CBC: Recent Labs  Lab 08/06/22 1500 08/06/22 1506 08/07/22 0039  WBC 5.6  --  5.2   NEUTROABS 3.8  --   --   HGB 13.3 14.3 11.0*  HCT 40.9 42.0 33.5*  MCV 99.8  --  98.2  PLT 229  --  210   Basic Metabolic Panel: Recent Labs  Lab 08/06/22 1500 08/06/22 1506 08/06/22 1638  NA 142 143  --   K 3.8 3.8  --   CL 104 106  --   CO2 25  --   --   GLUCOSE 150* 151*  --   BUN 51* 50*  --   CREATININE 2.08* 2.10*  --   CALCIUM 9.6  --   --   MG  --   --  2.1   GFR: Estimated Creatinine Clearance: 14.8 mL/min (A) (by C-G formula based on SCr of 2.1 mg/dL (H)). Liver Function Tests: Recent Labs  Lab 08/06/22 1500  AST 20  ALT 13  ALKPHOS 72  BILITOT 0.4  PROT 7.2  ALBUMIN 4.0   No results for input(s): "LIPASE", "AMYLASE" in the last 168 hours. No results for input(s): "AMMONIA" in the last 168 hours. Coagulation Profile: Recent Labs  Lab 08/06/22 1500  INR 1.0   Cardiac Enzymes: Recent Labs  Lab 08/06/22 1512 08/06/22 1808  TROPONINIHS 53* 54*   BNP (last 3 results) No results for input(s): "BNP" in the last 8760 hours. HbA1C: Recent Labs    08/07/22 0039  HGBA1C 6.4*   CBG: Recent Labs  Lab 08/06/22 1454 08/06/22 1543 08/06/22 1808  GLUCAP 129* 128* 128*   Lipid Profile: Recent Labs    08/07/22 0039  CHOL 142  HDL 27*  LDLCALC 91  TRIG 161  CHOLHDL 5.3   Thyroid Function Tests: No results for input(s): "TSH", "T4TOTAL", "FREET4", "T3FREE", "THYROIDAB" in the last 72 hours. Anemia Panel: No results for input(s): "VITAMINB12", "FOLATE", "FERRITIN", "TIBC", "IRON", "RETICCTPCT" in the last 72 hours. Urine analysis:    Component Value Date/Time   COLORURINE YELLOW 04/26/2009 0014   APPEARANCEUR CLEAR 04/26/2009 0014   LABSPEC 1.012 04/26/2009 0014   PHURINE 5.0 04/26/2009 0014   GLUCOSEU NEGATIVE 04/26/2009 0014   HGBUR NEGATIVE 04/26/2009 0014   BILIRUBINUR negative 11/14/2020 1608   KETONESUR NEGATIVE  04/26/2009 0014   PROTEINUR Negative 11/14/2020 1608   PROTEINUR NEGATIVE 04/26/2009 0014   UROBILINOGEN 0.2  11/14/2020 1608   UROBILINOGEN 0.2 04/26/2009 0014   NITRITE negative 11/14/2020 1608   NITRITE POSITIVE (A) 04/26/2009 0014   LEUKOCYTESUR Small (1+) (A) 11/14/2020 1608    Radiological Exams on Admission: I have personally reviewed images MR ANGIO HEAD WO CONTRAST  Result Date: 08/07/2022 CLINICAL DATA:  Acute neurologic deficit EXAM: MRA HEAD WITHOUT CONTRAST TECHNIQUE: Angiographic images of the Circle of Willis were acquired using MRA technique without intravenous contrast. COMPARISON:  None Available. FINDINGS: POSTERIOR CIRCULATION: --Vertebral arteries: Normal --Inferior cerebellar arteries: Normal. --Basilar artery: Normal. --Superior cerebellar arteries: Normal. --Posterior cerebral arteries: Normal. ANTERIOR CIRCULATION: --Intracranial internal carotid arteries: Symmetric signal loss of the petrous segments of the internal carotid arteries, suggesting artifact. The ICAs are otherwise normal. Both posterior communicating arteries are patent. --Anterior cerebral arteries (ACA): Normal. --Middle cerebral arteries (MCA): Normal. IMPRESSION: Normal intracranial MRA. Electronically Signed   By: Deatra Robinson M.D.   On: 08/07/2022 01:16   MR CERVICAL SPINE WO CONTRAST  Result Date: 08/07/2022 CLINICAL DATA:  Cervical myelopathy EXAM: MRI CERVICAL SPINE WITHOUT CONTRAST TECHNIQUE: Multiplanar, multisequence MR imaging of the cervical spine was performed. No intravenous contrast was administered. COMPARISON:  None Available. FINDINGS: Alignment: Grade 1 anterolisthesis at C4-5 and C7-T1. Vertebrae: Modic type 1 signal changes at the opposing endplates of C6-7. No acute abnormality. Cord: Normal signal and morphology. Posterior Fossa, vertebral arteries, paraspinal tissues: Negative. Disc levels: C1-2: Unremarkable. C2-3: Small disc bulge and mild facet hypertrophy with ligamentum flavum infolding. There is no spinal canal stenosis. No neural foraminal stenosis. C3-4: Intermediate sized disc  osteophyte complex. Mild spinal canal stenosis. Moderate right neural foraminal stenosis. C4-5: Small disc bulge with bilateral uncovertebral hypertrophy and mild facet arthrosis. There is no spinal canal stenosis. Moderate bilateral neural foraminal stenosis. C5-6: Severe disc space loss with moderate left facet hypertrophy. There is no spinal canal stenosis. Mild right and moderate left neural foraminal stenosis. C6-7: Small disc bulge with bilateral uncovertebral hypertrophy. There is no spinal canal stenosis. Severe bilateral neural foraminal stenosis. C7-T1: Small disc bulge. There is no spinal canal stenosis. Moderate right and mild left neural foraminal stenosis. IMPRESSION: 1. Multilevel moderate-to-severe neural foraminal stenosis, worst at C6-7. 2. Mild spinal canal stenosis at C3-4. Electronically Signed   By: Deatra Robinson M.D.   On: 08/07/2022 00:53   MR BRAIN WO CONTRAST  Result Date: 08/06/2022 CLINICAL DATA:  Acute neurologic deficit EXAM: MRI HEAD WITHOUT CONTRAST TECHNIQUE: Multiplanar, multiecho pulse sequences of the brain and surrounding structures were obtained without intravenous contrast. COMPARISON:  None Available. FINDINGS: Brain: No acute infarct, mass effect or extra-axial collection. No acute or chronic hemorrhage. There is confluent hyperintense T2-weighted signal within the white matter. Parenchymal volume and CSF spaces are normal. The midline structures are normal. Vascular: Major flow voids are preserved. Skull and upper cervical spine: Normal calvarium and skull base. Visualized upper cervical spine and soft tissues are normal. Sinuses/Orbits:No paranasal sinus fluid levels or advanced mucosal thickening. No mastoid or middle ear effusion. Normal orbits. IMPRESSION: 1. No acute intracranial abnormality. 2. Advanced chronic microangiopathic white matter changes. Electronically Signed   By: Deatra Robinson M.D.   On: 08/06/2022 21:21   CT HEAD WO CONTRAST  Result Date:  08/06/2022 CLINICAL DATA:  Transient ischemic attack. EXAM: CT HEAD WITHOUT CONTRAST TECHNIQUE: Contiguous axial images were obtained from the base of the skull through the vertex without  intravenous contrast. RADIATION DOSE REDUCTION: This exam was performed according to the departmental dose-optimization program which includes automated exposure control, adjustment of the mA and/or kV according to patient size and/or use of iterative reconstruction technique. COMPARISON:  None Available. FINDINGS: Brain: No acute hemorrhage. Patchy and confluent hypoattenuation of the cerebral white matter, most consistent with severe chronic small-vessel disease. Cortical gray-white differentiation is preserved. No hydrocephalus or extra-axial collection. No mass effect or midline shift. Vascular: No hyperdense vessel or unexpected calcification. Skull: No calvarial fracture or suspicious bone lesion. Skull base is unremarkable. Sinuses/Orbits: Unremarkable. Other: None. IMPRESSION: 1. No acute intracranial abnormality. 2. Severe chronic small-vessel disease. Electronically Signed   By: Orvan Falconer M.D.   On: 08/06/2022 15:50    EKG: My personal interpretation of EKG shows: Normal sinus rhythm    Assessment/Plan Principal Problem:   CVA (cerebral vascular accident) (HCC) Active Problems:   Essential hypertension   CKD (chronic kidney disease), stage IV (HCC)   Hyperlipidemia   DM type 2 (diabetes mellitus, type 2) (HCC)   History of CAD (coronary artery disease) of artery bypass graft    Assessment and Plan:  CVA - CT head no acute intracranial abnormality, severe chronic small vessel disease. - MRI of the head no acute intracranial abnormality and advanced chronic microangiopathic white matter change. - Patient is presenting with persistent left-sided upper extremity weakness for 1 day.  With negative CT and MRI finding neurology has been consulted in the ED who recommended further inpatient stroke  workup and concern for MRI negative ischemic stroke. - CT cervical spine showed moderate to severe foraminal stenosis and mild spinal canal stenosis. -MR angiogram of the head normal intracranial finding. -Per neurology recommendation checking echocardiogram, lipid panel, A1c. -Continue aspirin 81 mg daily - Continue DVT prophylaxis-Lovenox 30 mg daily - Continue permissive hypertension <210/110 for first 24 hours.  Holding oral blood pressure medication.   Continue hydralazine 10 mg every 8 as needed with SBP >210 and DBP<110. -Consulted with PT and OT. -  If patient passes bedside nursing swallow patient continue the oral medication and resume the diet. -SLP has been consulted. - Continue telemonitoring. - Fall precaution. - Keep the head of the bed elevated 30 degree angle  Essential hypertension - Continue permissive hypertension <210/110 for first 24 hours.  Holding oral blood pressure medication.  Continue hydralazine 10 mg every 8 as needed with SBP >210 and DBP<110.   History of CAD status post CABG -Checking lipid panel.  If LDL is above 70 will resume statin. -Holding Coreg for permissive hypertension acute CVA In the setting of  CKD stage IV -Baseline creatinine 2.03 to 2.44 -Baseline GFR around 23. -Continue to follow-up with CMP and monitor urine output -Holding Jardiance as creatinine slightly elevated from her baseline.  DM type II -A1c 6.4 08/01/2022 -Home regimen include glimepiride and Jardiance.  Holding home oral antidiabetic regimen. - Continue POC blood glucose with meal and at bedtime and continue sliding scale insulin.  Hyperlipidemia -Per chart review patient is not on Lipitor. - Neurology recommended to start statin if LDL is above 70  DVT prophylaxis: Lovenox Code Status: Full Code.  Spoken with both patient and patient's son.  She wants to be remain full code. Disposition Plan: Follow-up with echocardiogram,  PT and OT plan. Consults called:  Neurology  admission status: Observation, Telemetry bed   Tereasa Coop, MD [facility] 08/07/2022, 4:57 AM

## 2022-08-07 NOTE — TOC CM/SW Note (Signed)
Transition of Care [facility]) - Inpatient Brief Assessment   Patient Details  Name: [name] MRN: [id number] Date of Birth: 09/16/1927  Transition of Care [facility] CM/SW Contact:    Epifanio Lesches, RN Phone Number: 08/07/2022, 8:07 AM   Clinical Narrative: Presents with left upper ext weakness. From home alone. Son lives next door. Receives PCS 2 days a week from 10 am - 2pm. DME: RW, W/C. Pt without transportation issues or RX med concerns.  TOC team following and will assist with needs....  Transition of Care Asessment: Insurance and Status: Insurance coverage has been reviewed Patient has primary care physician: Yes (Schmerge, Belenda Cruise, NP) Home environment has been reviewed: From home alone. Son lives next door, [place] Prior level of function:: Independent with ADL's. PCS 2 days a week, 10am-2pm. Owns RW, W/C. Prior/Current Home Services: No current home services Social Determinants of Health Reivew: SDOH reviewed no interventions necessary Readmission risk has been reviewed: No Transition of care needs: transition of care needs identified, TOC will continue to follow

## 2022-08-07 NOTE — Progress Notes (Signed)
  Echocardiogram 2D Echocardiogram has been performed.  [name] 08/07/2022, 11:50 AM

## 2022-08-07 NOTE — Progress Notes (Signed)
Admitted patient from ER via stretcher. Pt is alert and oriented x4. Complaining of pain on her right leg which she claims as sciatica pain. Weak left arm noted. Pupils both equally round and reactive. 20G PIV noted on the right, flushed and saline locked. Oriented pt to room and surroundings. Call bell placed within reach, bed alarms on, wheels locked.

## 2022-08-07 NOTE — Evaluation (Signed)
Physical Therapy Evaluation Patient Details Name: [name] MRN: [id number] DOB: 1927-06-09 Today's Date: 08/07/2022  History of Present Illness  89 year old female medical history significant for CVA in 2003, CAD status post CABG, hyperlipidemia, hypertension, CKD stage IV, lumbar radiculopathy, diabetes mellitus type 2 non-insulin-dependent and closed wedge compression fracture of L1 vertebra with routine healing presented to [facility] emergency department on 08/06/2022 with complaining of left-sided upper arm weakness. CT head no acute intracranial abnormality and severe chronic small vessel disease. MRI no acute intracranial abnormality.   Clinical Impression  [name] is 89 y.o. female admitted with above HPI and diagnosis. Patient is currently limited by functional impairments below (see PT problem list). Patient lives alone and has become more limited with mobility in last ~2 months relying on WC to scoot around home with bil LE. Today she requires min-mod assist for sit<>stand with RW and was able to completed 3 x 7' gait with RW and min assist with close chair follow. Pt provided cues for Rt foot placement to facilitate Rt LE abduction with advancement. Patient will benefit from continued skilled PT interventions to address impairments and progress independence with mobility, recommending Patient will benefit from follow up therapy, <3 hours/day. Acute PT will follow and progress as able.        Recommendations for follow up therapy are one component of a multi-disciplinary discharge planning process, led by the attending physician.  Recommendations may be updated based on patient status, additional functional criteria and insurance authorization.  Follow Up Recommendations Can patient physically be transported by private vehicle: No     Assistance Recommended at Discharge Frequent or constant Supervision/Assistance  Patient can return home with the following  A lot of help with  walking and/or transfers;A lot of help with bathing/dressing/bathroom;Assistance with cooking/housework;Direct supervision/assist for medications management;Assist for transportation;Help with stairs or ramp for entrance    Equipment Recommendations None recommended by PT  Recommendations for Other Services       Functional Status Assessment Patient has had a recent decline in their functional status and demonstrates the ability to make significant improvements in function in a reasonable and predictable amount of time.     Precautions / Restrictions Precautions Precautions: Fall Precaution Comments: LUE weakness more distal then proximal Restrictions Weight Bearing Restrictions: No      Mobility  Bed Mobility               General bed mobility comments: pt OOB in recliner at start and preferring to remain up. lunch set up with pt in recliner at EOS.    Transfers Overall transfer level: Needs assistance Equipment used: Rolling walker (2 wheels) Transfers: Sit to/from Stand Sit to Stand: Mod assist           General transfer comment: pt reliant on bil UE for power up and min-mod assist to complete  rise and steady with hand transition to hold RW for stability. pt remembering to reach back for controlled lowering ~50% of the time with uncontrolled descent to chair without UE use.    Ambulation/Gait Ambulation/Gait assistance: Min assist Gait Distance (Feet): 7 Feet Assistive device: Rolling walker (2 wheels) Gait Pattern/deviations: Step-through pattern, Decreased step length - right, Decreased step length - left, Decreased stride length, Decreased dorsiflexion - right, Knee flexed in stance - right, Shuffle, Narrow base of support, Trunk flexed Gait velocity: decr     General Gait Details: pt with significant flexion posture with ROM limitations at bil hips and unable to stand  upright preferring to lean forearms on walker for support. Rt knee flexed in stance and  unable to place Rt heel to ground. BOS very narrow with Rt LE externally rotated and heel contacting Lt foot. Verbal/visual cues for pt to step Rt foot forward/abduct to increased BOS, pt able to initiate movement but often Rt foot drifting back to narrow BOS prior to stepping Lt LE forward.  Stairs            Wheelchair Mobility    Modified Rankin (Stroke Patients Only)       Balance Overall balance assessment: Needs assistance Sitting-balance support: Feet supported Sitting balance-[name]: Fair     Standing balance support: During functional activity, Reliant on assistive device for balance, Bilateral upper extremity supported Standing balance-[name]: Zero Standing balance comment: pt with maximal reliant on external support and RW for standing balance                             Pertinent Vitals/Pain Pain Assessment Pain Assessment: No/denies pain    Home Living Family/patient expects to be discharged to:: Private residence Living Arrangements: Alone Available Help at Discharge: Available PRN/intermittently;Personal care attendant (Son lives next door but he still works some.  She has an aide on Thursdays 10:00-4:00 am) Type of Home: House Home Access: Stairs to enter;Ramped entrance     Alternate Level Stairs-Number of Steps: does not go to second level Home Layout: Two level;Able to live on main level with bedroom/bathroom Home Equipment: Wheelchair - Forensic psychologist (2 wheels);Rollator (4 wheels);Cane - single point;Cane - quad;Shower seat;BSC/3in1 Additional Comments: Gets meals on wheels M-F    Prior Function Prior Level of Function : Needs assist             Mobility Comments: Recently has been using the wheelchair to get around, ~ 2months. pt uses rollator for short distances. propells WC with bil LE to pull forward and uses RW in bathroom to transfer sit<>stand and take small steps to toilet. (WC doesn't fit in bathroom) ADLs  Comments: Aide assists with bath and now she needs assist with dressing     Hand Dominance   Dominant Hand: Right    Extremity/Trunk Assessment   Upper Extremity Assessment Upper Extremity Assessment: Defer to OT evaluation LUE Deficits / Details: AROM shoulder flexion 0-90 degrees, strength 3+/5, elbow flexors and extensors 3+/5,  wrist flexors 3+5, wrist extensors 2+/5, grasp 3/5 with digit extension 2-/5. LUE Sensation: decreased light touch (pt reports being able to feel light touch but has increased numbness on the tips of all digits in the left hand.)    Lower Extremity Assessment Lower Extremity Assessment: Generalized weakness;RLE deficits/detail;LLE deficits/detail RLE Deficits / Details: grossly 3-/5 throughout with signifnicant hip/knee flexion contracture from chronic use of WC. pt unable to get Rt ankle to neutral lacking ~10* dorsiflexion. RLE Coordination: decreased gross motor;decreased fine motor LLE Deficits / Details: grossly 3-/5 throughout with signifnicant hip/knee flexion contracture from chronic use of WC. LLE Coordination: decreased fine motor;decreased gross motor    Cervical / Trunk Assessment Cervical / Trunk Assessment: Kyphotic  Communication   Communication: No difficulties  Cognition Arousal/Alertness: Awake/alert Behavior During Therapy: WFL for tasks assessed/performed Overall Cognitive Status: Within Functional Limits for tasks assessed                                 General Comments:  pt pleasant, high interest in therapy and aware of deficits.        General Comments      Exercises General Exercises - Lower Extremity Long Arc Quad: AROM, Both, 10 reps Toe Raises: AROM, Both, 10 reps Heel Raises: AROM, Both, 10 reps   Assessment/Plan    PT Assessment Patient needs continued PT services  PT Problem List Decreased strength;Decreased range of motion;Decreased activity tolerance;Decreased balance;Decreased  mobility;Decreased coordination;Decreased knowledge of use of DME;Decreased safety awareness;Decreased knowledge of precautions;Impaired tone;Cardiopulmonary status limiting activity       PT Treatment Interventions DME instruction;Gait training;Stair training;Functional mobility training;Therapeutic activities;Therapeutic exercise;Balance training;Neuromuscular re-education;Cognitive remediation;Patient/family education;Wheelchair mobility training    PT Goals (Current goals can be found in the Care Plan section)  Acute Rehab PT Goals Patient Stated Goal: get stronger with walking and do rehab PT Goal Formulation: With patient Time For Goal Achievement: 08/21/22 Potential to Achieve Goals: Fair    Frequency Min 2X/week     Co-evaluation               AM-PAC PT "6 Clicks" Mobility  Outcome Measure Help needed turning from your back to your side while in a flat bed without using bedrails?: A Lot Help needed moving from lying on your back to sitting on the side of a flat bed without using bedrails?: A Lot Help needed moving to and from a bed to a chair (including a wheelchair)?: A Lot Help needed standing up from a chair using your arms (e.g., wheelchair or bedside chair)?: A Lot Help needed to walk in hospital room?: A Lot Help needed climbing 3-5 steps with a railing? : Total 6 Click Score: 11    End of Session Equipment Utilized During Treatment: Gait belt Activity Tolerance: Patient tolerated treatment well Patient left: in chair;with call bell/phone within reach;with chair alarm set Nurse Communication: Mobility status PT Visit Diagnosis: Muscle weakness (generalized) (M62.81);Difficulty in walking, not elsewhere classified (R26.2);Other abnormalities of gait and mobility (R26.89);Unsteadiness on feet (R26.81)    Time: 6213-0865 PT Time Calculation (min) (ACUTE ONLY): 22 min   Charges:   PT Evaluation $PT Eval Moderate Complexity: 1 Mod          Wynn Maudlin,  DPT Acute Rehabilitation Services Office [phone]  08/07/22 2:21 PM

## 2022-08-07 NOTE — Progress Notes (Addendum)
STROKE TEAM PROGRESS NOTE   BRIEF HPI Ms. [name] is a 89 y.o. female with history of prior stroke in 2003 with no residual deficit, DM2, HTN, HLD, RA who presents with sudden onset L hand weakness. She went to bed at 1700 on 08/05/22 and woke up at 0200 on 08/06/22 with left upper ext weakness. She noted that her hand was dragging and had to use R hand to move it. No prior hx of similar symptoms. Reports a couple months ago, had RLE weakness but there was a lot of pain. She was told she has sciatica. With treatment, the pain improved, she still feels that there is a bit of weakness. Symptoms have improved but she is not back to her baseline and still has L hand weakness.  SIGNIFICANT HOSPITAL EVENTS 6/19 MRI Brain - No acute intracranial abnormality.   INTERM HISTORY/SUBJECTIVE Venous duplex and echocardiogram today. Alert and oriented, neurologically and hemodynamically stable. Left hand weakness continues with decreased grip, wrist flexion and extension.  Patient states she uses a wheelchair at home due to lower back and leg pain. States she was recently taken off Aspirin (around 2 months ago) by her PCP after being on it after her prior CVA in 2003.    OBJECTIVE  CBC    Component Value Date/Time   WBC 5.2 08/07/2022 0039   RBC 3.41 (L) 08/07/2022 0039   HGB 11.0 (L) 08/07/2022 0039   HGB 10.8 (L) 11/14/2020 1524   HCT 33.5 (L) 08/07/2022 0039   HCT 32.8 (L) 11/14/2020 1524   PLT 210 08/07/2022 0039   PLT 206 11/14/2020 1524   MCV 98.2 08/07/2022 0039   MCV 97 11/14/2020 1524   MCH 32.3 08/07/2022 0039   MCHC 32.8 08/07/2022 0039   RDW 11.9 08/07/2022 0039   RDW 13.1 11/14/2020 1524   LYMPHSABS 1.1 08/06/2022 1500   MONOABS 0.5 08/06/2022 1500   EOSABS 0.1 08/06/2022 1500   BASOSABS 0.0 08/06/2022 1500    BMET    Component Value Date/Time   NA 141 08/07/2022 0039   NA 143 07/17/2021 1549   K 3.4 (L) 08/07/2022 0039   CL 110 08/07/2022 0039   CO2 23 08/07/2022  0039   GLUCOSE 87 08/07/2022 0039   BUN 43 (H) 08/07/2022 0039   BUN 51 (H) 07/17/2021 1549   CREATININE 1.71 (H) 08/07/2022 0039   CALCIUM 8.1 (L) 08/07/2022 0039   EGFR 20.0 05/15/2022 1628   EGFR 18 (L) 07/17/2021 1549   GFRNONAA 28 (L) 08/07/2022 0039    IMAGING past 24 hours ECHOCARDIOGRAM COMPLETE  Result Date: 08/07/2022    ECHOCARDIOGRAM REPORT   [name] Date of Exam: 08/07/2022 Medical Rec #:  [id number]       Height:       64.0 in Accession #:    [id number]      Weight:       130.0 lb Date of Birth:  1927/04/04       BSA:          1.629 m Patient Age:    89 years        BP:           156/70 mmHg Patient Gender: F               HR:           58 bpm. Exam Location:  Inpatient Procedure: 2D Echo, Cardiac Doppler and Color Doppler Indications:  stroke  History:        Patient has prior history of Echocardiogram examinations, most                 recent 10/08/2001. Prior CABG, chronic kidney disease, PAD and                 Stroke; Risk Factors:Hypertension, Dyslipidemia and Diabetes.  Sonographer:    Delcie Roch RDCS Referring Phys: [id number] [facility] IMPRESSIONS  1. Left ventricular ejection fraction, by estimation, is 60 to 65%. The left ventricle has normal function. The left ventricle has no regional wall motion abnormalities. There is mild left ventricular hypertrophy. Left ventricular diastolic parameters are consistent with Grade I diastolic dysfunction (impaired relaxation).  2. Right ventricular systolic function is normal. The right ventricular size is normal. There is normal pulmonary artery systolic pressure.  3. The mitral valve is grossly normal. Mild mitral valve regurgitation.  4. The aortic valve is grossly normal. Aortic valve regurgitation is not visualized.  5. The inferior vena cava is normal in size with greater than 50% respiratory variability, suggesting right atrial pressure of 3 mmHg. FINDINGS  Left Ventricle: Left ventricular ejection  fraction, by estimation, is 60 to 65%. The left ventricle has normal function. The left ventricle has no regional wall motion abnormalities. The left ventricular internal cavity size was normal in size. There is  mild left ventricular hypertrophy. Left ventricular diastolic parameters are consistent with Grade I diastolic dysfunction (impaired relaxation). Right Ventricle: The right ventricular size is normal. Right ventricular systolic function is normal. There is normal pulmonary artery systolic pressure. The tricuspid regurgitant velocity is 2.81 m/s, and with an assumed right atrial pressure of 3 mmHg,  the estimated right ventricular systolic pressure is 34.6 mmHg. Left Atrium: Left atrial size was normal in size. Right Atrium: Right atrial size was normal in size. Pericardium: There is no evidence of pericardial effusion. Mitral Valve: The mitral valve is grossly normal. Mild mitral valve regurgitation. Tricuspid Valve: The tricuspid valve is normal in structure. Tricuspid valve regurgitation is mild. Aortic Valve: The aortic valve is grossly normal. Aortic valve regurgitation is not visualized. Aortic valve mean gradient measures 4.0 mmHg. Aortic valve peak gradient measures 8.6 mmHg. Aortic valve area, by VTI measures 1.83 cm. Pulmonic Valve: Pulmonic valve regurgitation is not visualized. Aorta: The aortic root and ascending aorta are structurally normal, with no evidence of dilitation. Venous: The inferior vena cava is normal in size with greater than 50% respiratory variability, suggesting right atrial pressure of 3 mmHg. IAS/Shunts: The interatrial septum was not assessed.  LEFT VENTRICLE PLAX 2D LVIDd:         4.30 cm   Diastology LVIDs:         3.00 cm   LV e' medial:    3.15 cm/s LV PW:         1.05 cm   LV E/e' medial:  22.7 LV IVS:        1.20 cm   LV e' lateral:   6.20 cm/s LVOT diam:     1.80 cm   LV E/e' lateral: 11.5 LV SV:         57 LV SV Index:   35 LVOT Area:     2.54 cm  RIGHT VENTRICLE             IVC RV S prime:     8.49 cm/s  IVC diam: 1.40 cm TAPSE (M-mode): 2.3 cm LEFT ATRIUM  Index        RIGHT ATRIUM          Index LA diam:        2.70 cm 1.66 cm/m   RA Area:     9.05 cm LA Vol (A2C):   45.0 ml 27.62 ml/m  RA Volume:   16.80 ml 10.31 ml/m LA Vol (A4C):   35.7 ml 21.91 ml/m LA Biplane Vol: 45.6 ml 27.99 ml/m  AORTIC VALVE AV Area (Vmax):    1.61 cm AV Area (Vmean):   1.78 cm AV Area (VTI):     1.83 cm AV Vmax:           147.00 cm/s AV Vmean:          92.200 cm/s AV VTI:            0.311 m AV Peak Grad:      8.6 mmHg AV Mean Grad:      4.0 mmHg LVOT Vmax:         93.20 cm/s LVOT Vmean:        64.600 cm/s LVOT VTI:          0.224 m LVOT/AV VTI ratio: 0.72  AORTA Ao Root diam: 2.70 cm Ao Asc diam:  2.10 cm MITRAL VALVE                TRICUSPID VALVE MV Area (PHT): 2.29 cm     TR Peak grad:   31.6 mmHg MV Decel Time: 331 msec     TR Vmax:        281.00 cm/s MR Peak grad: 141.6 mmHg MR Mean grad: 91.0 mmHg     SHUNTS MR Vmax:      595.00 cm/s   Systemic VTI:  0.22 m MR Vmean:     454.0 cm/s    Systemic Diam: 1.80 cm MV E velocity: 71.60 cm/s MV A velocity: 126.00 cm/s MV E/A ratio:  0.57 Photographer signed by Carolan Clines Signature Date/Time: 08/07/2022/12:25:17 PM    Final    VAS US CAROTID  Result Date: 08/07/2022 Carotid Arterial Duplex Study Patient Name:  Zamora Star  Date of Exam:   08/07/2022 Medical Rec #: [id number]        Accession #:    [id number] Date of Birth: 1927-09-05        Patient Gender: F Patient Age:   43 years Exam Location:  [facility] Procedure:      VAS US CAROTID Referring Phys: Terrilee Files [facility] --------------------------------------------------------------------------------  Indications:       Weakness. Risk Factors:      Hypertension, hyperlipidemia, Diabetes, prior MI, coronary                    artery disease. Other Factors:     CKD !V. Limitations        Today's exam was limited due to Kyphosis. Comparison Study:  Prior  carotid duplex indicating bilateral 1-39% ICA stenosis                    done 12/08/17 Performing Technologist: Sherren Kerns RVS  Examination Guidelines: A complete evaluation includes B-mode imaging, spectral Doppler, color Doppler, and power Doppler as needed of all accessible portions of each vessel. Bilateral testing is considered an integral part of a complete examination. Limited examinations for reoccurring indications may be performed as noted.  Right Carotid Findings: +----------+--------+--------+--------+------------------+------------------+           PSV cm/sEDV  cm/sStenosisPlaque DescriptionComments           +----------+--------+--------+--------+------------------+------------------+ CCA Prox  65      9                                 intimal thickening +----------+--------+--------+--------+------------------+------------------+ CCA Distal50      12                                intimal thickening +----------+--------+--------+--------+------------------+------------------+ ICA Prox  53      8       1-39%   heterogenous                         +----------+--------+--------+--------+------------------+------------------+ ICA Mid   53      10                                                   +----------+--------+--------+--------+------------------+------------------+ ICA Distal44      12                                                   +----------+--------+--------+--------+------------------+------------------+ ECA       86      3                                                    +----------+--------+--------+--------+------------------+------------------+ +----------+--------+-------+--------+-------------------+           PSV cm/sEDV cmsDescribeArm Pressure (mmHG) +----------+--------+-------+--------+-------------------+ [id number]                                         +----------+--------+-------+--------+-------------------+  +---------+--------+--+--------+--+ VertebralPSV cm/s51EDV cm/s11 +---------+--------+--+--------+--+  Left Carotid Findings: +----------+--------+--------+--------+------------------+------------------+           PSV cm/sEDV cm/sStenosisPlaque DescriptionComments           +----------+--------+--------+--------+------------------+------------------+ CCA Prox  64      9                                 intimal thickening +----------+--------+--------+--------+------------------+------------------+ CCA Distal69      14                                intimal thickening +----------+--------+--------+--------+------------------+------------------+ ICA Prox  93      22      1-39%   heterogenous                         +----------+--------+--------+--------+------------------+------------------+ ICA Mid   94      17                                                   +----------+--------+--------+--------+------------------+------------------+  ICA Distal74      16                                                   +----------+--------+--------+--------+------------------+------------------+ ECA       74      4                                                    +----------+--------+--------+--------+------------------+------------------+ +----------+--------+--------+--------+-------------------+           PSV cm/sEDV cm/sDescribeArm Pressure (mmHG) +----------+--------+--------+--------+-------------------+ Subclavian102                                         +----------+--------+--------+--------+-------------------+ +---------+--------+--+--------+--+ VertebralPSV cm/s50EDV cm/s13 +---------+--------+--+--------+--+   Summary: Right Carotid: Velocities in the right ICA are consistent with a 1-39% stenosis. Left Carotid: Velocities in the left ICA are consistent with a 1-39% stenosis. Vertebrals:  Bilateral vertebral arteries demonstrate antegrade  flow. Subclavians: Normal flow hemodynamics were seen in bilateral subclavian              arteries. *See table(s) above for measurements and observations.     Preliminary    MR ANGIO HEAD WO CONTRAST  Result Date: 08/07/2022 CLINICAL DATA:  Acute neurologic deficit EXAM: MRA HEAD WITHOUT CONTRAST TECHNIQUE: Angiographic images of the Circle of Willis were acquired using MRA technique without intravenous contrast. COMPARISON:  None Available. FINDINGS: POSTERIOR CIRCULATION: --Vertebral arteries: Normal --Inferior cerebellar arteries: Normal. --Basilar artery: Normal. --Superior cerebellar arteries: Normal. --Posterior cerebral arteries: Normal. ANTERIOR CIRCULATION: --Intracranial internal carotid arteries: Symmetric signal loss of the petrous segments of the internal carotid arteries, suggesting artifact. The ICAs are otherwise normal. Both posterior communicating arteries are patent. --Anterior cerebral arteries (ACA): Normal. --Middle cerebral arteries (MCA): Normal. IMPRESSION: Normal intracranial MRA. Electronically Signed   By: Deatra Robinson M.D.   On: 08/07/2022 01:16   MR CERVICAL SPINE WO CONTRAST  Result Date: 08/07/2022 CLINICAL DATA:  Cervical myelopathy EXAM: MRI CERVICAL SPINE WITHOUT CONTRAST TECHNIQUE: Multiplanar, multisequence MR imaging of the cervical spine was performed. No intravenous contrast was administered. COMPARISON:  None Available. FINDINGS: Alignment: Grade 1 anterolisthesis at C4-5 and C7-T1. Vertebrae: Modic type 1 signal changes at the opposing endplates of C6-7. No acute abnormality. Cord: Normal signal and morphology. Posterior Fossa, vertebral arteries, paraspinal tissues: Negative. Disc levels: C1-2: Unremarkable. C2-3: Small disc bulge and mild facet hypertrophy with ligamentum flavum infolding. There is no spinal canal stenosis. No neural foraminal stenosis. C3-4: Intermediate sized disc osteophyte complex. Mild spinal canal stenosis. Moderate right neural foraminal  stenosis. C4-5: Small disc bulge with bilateral uncovertebral hypertrophy and mild facet arthrosis. There is no spinal canal stenosis. Moderate bilateral neural foraminal stenosis. C5-6: Severe disc space loss with moderate left facet hypertrophy. There is no spinal canal stenosis. Mild right and moderate left neural foraminal stenosis. C6-7: Small disc bulge with bilateral uncovertebral hypertrophy. There is no spinal canal stenosis. Severe bilateral neural foraminal stenosis. C7-T1: Small disc bulge. There is no spinal canal stenosis. Moderate right and mild left neural foraminal stenosis. IMPRESSION: 1. Multilevel moderate-to-severe neural foraminal stenosis, worst at C6-7. 2.  Mild spinal canal stenosis at C3-4. Electronically Signed   By: Deatra Robinson M.D.   On: 08/07/2022 00:53   MR BRAIN WO CONTRAST  Result Date: 08/06/2022 CLINICAL DATA:  Acute neurologic deficit EXAM: MRI HEAD WITHOUT CONTRAST TECHNIQUE: Multiplanar, multiecho pulse sequences of the brain and surrounding structures were obtained without intravenous contrast. COMPARISON:  None Available. FINDINGS: Brain: No acute infarct, mass effect or extra-axial collection. No acute or chronic hemorrhage. There is confluent hyperintense T2-weighted signal within the white matter. Parenchymal volume and CSF spaces are normal. The midline structures are normal. Vascular: Major flow voids are preserved. Skull and upper cervical spine: Normal calvarium and skull base. Visualized upper cervical spine and soft tissues are normal. Sinuses/Orbits:No paranasal sinus fluid levels or advanced mucosal thickening. No mastoid or middle ear effusion. Normal orbits. IMPRESSION: 1. No acute intracranial abnormality. 2. Advanced chronic microangiopathic white matter changes. Electronically Signed   By: Deatra Robinson M.D.   On: 08/06/2022 21:21   CT HEAD WO CONTRAST  Result Date: 08/06/2022 CLINICAL DATA:  Transient ischemic attack. EXAM: CT HEAD WITHOUT CONTRAST  TECHNIQUE: Contiguous axial images were obtained from the base of the skull through the vertex without intravenous contrast. RADIATION DOSE REDUCTION: This exam was performed according to the departmental dose-optimization program which includes automated exposure control, adjustment of the mA and/or kV according to patient size and/or use of iterative reconstruction technique. COMPARISON:  None Available. FINDINGS: Brain: No acute hemorrhage. Patchy and confluent hypoattenuation of the cerebral white matter, most consistent with severe chronic small-vessel disease. Cortical gray-white differentiation is preserved. No hydrocephalus or extra-axial collection. No mass effect or midline shift. Vascular: No hyperdense vessel or unexpected calcification. Skull: No calvarial fracture or suspicious bone lesion. Skull base is unremarkable. Sinuses/Orbits: Unremarkable. Other: None. IMPRESSION: 1. No acute intracranial abnormality. 2. Severe chronic small-vessel disease. Electronically Signed   By: Orvan Falconer M.D.   On: 08/06/2022 15:50    Vitals:   08/07/22 0900 08/07/22 1000 08/07/22 1100 08/07/22 1227  BP:    (!) 166/63  Pulse:    (!) 58  Resp: 18 17 14 16   Temp:    98.6 F (37 C)  TempSrc:    Oral  SpO2:    100%  Weight:      Height:        PHYSICAL EXAM General: Pleasant frail elderly African-American lady in no acute distress Psych:  Mood and affect appropriate for situation CV: Regular rate and rhythm on monitor Respiratory:  Regular, unlabored respirations on room air GI: Abdomen soft and nontender   NEURO:  Mental Status: AA&Ox3, patient is able to give clear and coherent history Speech/Language: speech is without dysarthria or aphasia.  Naming, repetition, fluency, and comprehension intact.  Cranial Nerves:  II: PERRL. Visual fields full.  III, IV, VI: EOMI. Eyelids elevate symmetrically.  V: Sensation is intact to light touch and symmetrical to face.  VII: Face is symmetrical  resting and smiling VIII: hearing intact to voice. IX, X: Palate elevates symmetrically. Phonation is normal.  QM:VHQIONGE shrug 5/5. XII: tongue is midline without fasciculations.  Motor:  RUE, BLE: 5/5 strength throughout LUE: 1/5 grip, 2/5 wrist flexion and extension, 3/5 bicep and tricep, 3/5 deltoid.   Tone: is normal and bulk is normal Sensation- Intact to light touch bilaterally. Extinction absent to light touch to DSS.   Coordination:: no ataxia.No drift. Decreased fine motor/RAM  LUE.  Gait- deferred   ASSESSMENT/PLAN 89 year old lady with sudden onset painless left hand  weakness due to Stroke determined by clinical assessment.  MRI negative for stroke and MRI C-spine does not show significant compression to explain hand weakness Code Stroke CT head No acute abnormality.  MRI - No acute intracranial abnormality MRI C Spine - Multilevel moderate-to-severe neural foraminal stenosis, worst at C6-7. Mild spinal canal stenosis at C3-4. MRA - Normal intracranial MRI Carotid Doppler  1-39% stenosis R and L ICA, bil vertebral arteries demonstrate antegrade flow  2D Echo EF 60-65%  LDL 91 HgbA1c 6.4 VTE prophylaxis - Lovenox  No antithrombotic prior to admission, now on aspirin 81 mg daily and clopidogrel 75 mg daily for 3 weeks and then ASA 81mg  alone. Therapy recommendations:  Pending eval  Disposition:  pending eval   Hx of Stroke/TIA Stroke in 2003 with no residual   Hypertension Home meds:  Amlodipine  Stable Blood Pressure Goal: BP less than 220/110 Gradually normalize over the next 24-48 hours. Avoid hypotension.   Hyperlipidemia LDL 91, goal < 70 Add Crestor 20mg    Continue statin at discharge  Diabetes type II Controlled Home meds:  glimepiride, jardiance  HgbA1c 6.4, goal < 7.0 CBGs SSI  Dysphagia Patient has post-stroke dysphagia, SLP consulted    Diet   Diet heart healthy/carb modified Room service appropriate? Yes; Fluid consistency: Thin    Advance diet as tolerated  Other Active Problems    Pt seen by Neuro NP/APP and later by MD. Note/plan to be edited by MD as needed.    Lynnae January, DNP, AGACNP-BC [facility] Please use AMION for contact information & EPIC for messaging.   Hospital day # 0 STROKE MD NOTE :  I have personally obtained history,examined this patient, reviewed notes, independently viewed imaging studies, participated in medical decision making and plan of care.ROS completed by me personally and pertinent positives fully documented  I have made any additions or clarifications directly to the above note. Agree with note above.  Patient presented with sudden onset of painless left hand weakness mostly distally with some proximal involvement as well likely due to small right hemispheric cortical infarct not visualized on MRI.  MRI C-spine shows no significant spinal stenosis or nerve compression to explain the and weakness.  Recommend aspirin and Plavix for 3 weeks followed by aspirin alone and aggressive risk factor modification.  Statin for elevated lipids.  Occupational Therapy consult.  Greater than 50% time during this 50-minute visit was spent on counseling and coordination of care about her imaging negative stroke and discussion about stroke restratification and evaluation and treatment and prevention and answering questions.  Discussed with Dr. Elvera Bicker, MD Medical Director [facility] Pager: [phone] 08/07/2022 2:40 PM   To contact Stroke Continuity provider, please refer to [email or web address]. After hours, contact General Neurology

## 2022-08-07 NOTE — ED Notes (Signed)
ED TO INPATIENT HANDOFF REPORT  ED Nurse Name and Phone #: Raquel Sarna [phone]  S Name/Age/Gender [name] 89 y.o. female Room/Bed: RESUSC/RESUSC  Code Status   Code Status: Not on file  Home/SNF/Other Home Patient oriented to: self, place, time, and situation Is this baseline?yes  Triage Complete: Triage complete  Chief Complaint CVA (cerebral vascular accident) [facility]) [I63.9]  Triage Note No notes on file   Allergies No Known Allergies  Level of Care/Admitting Diagnosis ED Disposition     ED Disposition  Admit   Condition  --   Comment  Hospital Area: [facility] [[id number]]  Level of Care: Telemetry Medical [104]  May place patient in observation at [facility] or [facility] if equivalent level of care is available:: No  Covid Evaluation: Asymptomatic - no recent exposure (last 10 days) testing not required  Diagnosis: CVA (cerebral vascular accident) [facility]) [[id number]]  Admitting Physician: Tereasa Coop [[id number]]  Attending Physician: Tereasa Coop [[id number]]          B Medical/Surgery History Past Medical History:  Diagnosis Date   Cerebrovascular disease, unspecified    Coronary atherosclerosis of unspecified type of vessel, native or graft    CVA (cerebral vascular accident) (HCC)    Diabetes mellitus without complication (HCC)    Other and unspecified hyperlipidemia    Rheumatoid arthritis(714.0)    Shingles 2003   Unspecified essential hypertension    Past Surgical History:  Procedure Laterality Date   Cardiac Bypass  2003   CATARACT EXTRACTION, BILATERAL  2007     A IV Location/Drains/Wounds Patient Lines/Drains/Airways Status     Active Line/Drains/Airways     Name Placement date Placement time Site Days   Peripheral IV 08/06/22 20 G Anterior;Distal;Right Forearm 08/06/22  1500  Forearm  1            Intake/Output Last 24 hours No intake or output data in the 24 hours ending 08/07/22  0356  Labs/Imaging Results for orders placed or performed during the hospital encounter of 08/06/22 (from the past 48 hour(s))  CBG monitoring, ED     Status: Abnormal   Collection Time: 08/06/22  2:54 PM  Result Value Ref Range   Glucose-Capillary 129 (H) 70 - 99 mg/dL    Comment: Glucose reference range applies only to samples taken after fasting for at least 8 hours.  Protime-INR     Status: None   Collection Time: 08/06/22  3:00 PM  Result Value Ref Range   Prothrombin Time 12.9 11.4 - 15.2 seconds   INR 1.0 0.8 - 1.2    Comment: (NOTE) INR goal varies based on device and disease states. Performed at [facility], [address], [place], [place] [address]   APTT     Status: None   Collection Time: 08/06/22  3:00 PM  Result Value Ref Range   aPTT 27 24 - 36 seconds    Comment: Performed at [facility], [address], [place], [place] [address]  CBC     Status: None   Collection Time: 08/06/22  3:00 PM  Result Value Ref Range   WBC 5.6 4.0 - 10.5 K/uL   RBC 4.10 3.87 - 5.11 MIL/uL   Hemoglobin 13.3 12.0 - 15.0 g/dL   HCT 10.2 72.5 - 36.6 %   MCV 99.8 80.0 - 100.0 fL   MCH 32.4 26.0 - 34.0 pg   MCHC 32.5 30.0 - 36.0 g/dL   RDW 44.0 34.7 -  15.5 %   Platelets 229 150 - 400 K/uL   nRBC 0.0 0.0 - 0.2 %    Comment: Performed at [facility], [address], [place], [place] [address]  Differential     Status: None   Collection Time: 08/06/22  3:00 PM  Result Value Ref Range   Neutrophils Relative % 70 %   Neutro Abs 3.8 1.7 - 7.7 K/uL   Lymphocytes Relative 20 %   Lymphs Abs 1.1 0.7 - 4.0 K/uL   Monocytes Relative 9 %   Monocytes Absolute 0.5 0.1 - 1.0 K/uL   Eosinophils Relative 1 %   Eosinophils Absolute 0.1 0.0 - 0.5 K/uL   Basophils Relative 0 %   Basophils Absolute 0.0 0.0 - 0.1 K/uL   Immature Granulocytes 0 %   Abs Immature Granulocytes 0.01 0.00 - 0.07 K/uL    Comment: Performed at [facility], [address] [place], [place]  [address]  Comprehensive metabolic panel     Status: Abnormal   Collection Time: 08/06/22  3:00 PM  Result Value Ref Range   Sodium 142 135 - 145 mmol/L   Potassium 3.8 3.5 - 5.1 mmol/L   Chloride 104 98 - 111 mmol/L   CO2 25 22 - 32 mmol/L   Glucose, Bld 150 (H) 70 - 99 mg/dL    Comment: Glucose reference range applies only to samples taken after fasting for at least 8 hours.   BUN 51 (H) 8 - 23 mg/dL   Creatinine, Ser 0.98 (H) 0.44 - 1.00 mg/dL   Calcium 9.6 8.9 - 11.9 mg/dL   Total Protein 7.2 6.5 - 8.1 g/dL   Albumin 4.0 3.5 - 5.0 g/dL   AST 20 15 - 41 U/L   ALT 13 0 - 44 U/L   Alkaline Phosphatase 72 38 - 126 U/L   Total Bilirubin 0.4 0.3 - 1.2 mg/dL   GFR, Estimated 22 (L) >60 mL/min    Comment: (NOTE) Calculated using the CKD-EPI Creatinine Equation (2021)    Anion gap 13 5 - 15    Comment: Performed at [facility], [address] [place], [place] [address]  Ethanol     Status: None   Collection Time: 08/06/22  3:00 PM  Result Value Ref Range   Alcohol, Ethyl (B) <10 <10 mg/dL    Comment: (NOTE) Lowest detectable limit for serum alcohol is 10 mg/dL.  For medical purposes only. Performed at [facility], [address] [place], [place] [address]   I-stat chem 8, ED     Status: Abnormal   Collection Time: 08/06/22  3:06 PM  Result Value Ref Range   Sodium 143 135 - 145 mmol/L   Potassium 3.8 3.5 - 5.1 mmol/L   Chloride 106 98 - 111 mmol/L   BUN 50 (H) 8 - 23 mg/dL   Creatinine, Ser 3.08 (H) 0.44 - 1.00 mg/dL   Glucose, Bld 657 (H) 70 - 99 mg/dL    Comment: Glucose reference range applies only to samples taken after fasting for at least 8 hours.   Calcium, Ion 1.17 1.15 - 1.40 mmol/L   TCO2 25 22 - 32 mmol/L   Hemoglobin 14.3 12.0 - 15.0 g/dL   HCT 84.6 96.2 - 95.2 %  Troponin I (High Sensitivity)     Status: Abnormal   Collection Time: 08/06/22  3:12 PM  Result Value Ref Range   Troponin I (High Sensitivity) 53 (H) <18 ng/L  Comment:  (NOTE) Elevated high sensitivity troponin I (hsTnI) values and significant  changes across serial measurements may suggest ACS but many other  chronic and acute conditions are known to elevate hsTnI results.  Refer to the "Links" section for chest pain algorithms and additional  guidance. Performed at [facility], [address], [place], [place] [address]   CBG monitoring, ED     Status: Abnormal   Collection Time: 08/06/22  3:43 PM  Result Value Ref Range   Glucose-Capillary 128 (H) 70 - 99 mg/dL    Comment: Glucose reference range applies only to samples taken after fasting for at least 8 hours.  Magnesium     Status: None   Collection Time: 08/06/22  4:38 PM  Result Value Ref Range   Magnesium 2.1 1.7 - 2.4 mg/dL    Comment: Performed at [facility], [address] [place], [place] [address]  Troponin I (High Sensitivity)     Status: Abnormal   Collection Time: 08/06/22  6:08 PM  Result Value Ref Range   Troponin I (High Sensitivity) 54 (H) <18 ng/L    Comment: (NOTE) Elevated high sensitivity troponin I (hsTnI) values and significant  changes across serial measurements may suggest ACS but many other  chronic and acute conditions are known to elevate hsTnI results.  Refer to the "Links" section for chest pain algorithms and additional  guidance. Performed at [facility], [address], [place], [place] [address]   CBG monitoring, ED     Status: Abnormal   Collection Time: 08/06/22  6:08 PM  Result Value Ref Range   Glucose-Capillary 128 (H) 70 - 99 mg/dL    Comment: Glucose reference range applies only to samples taken after fasting for at least 8 hours.  Lipid panel     Status: Abnormal   Collection Time: 08/07/22 12:39 AM  Result Value Ref Range   Cholesterol 142 0 - 200 mg/dL   Triglycerides 213 <086 mg/dL   HDL 27 (L) >57 mg/dL   Total CHOL/HDL Ratio 5.3 RATIO   VLDL 24 0 - 40 mg/dL   LDL Cholesterol 91 0 - 99 mg/dL    Comment:         Total Cholesterol/HDL:CHD Risk Coronary Heart Disease Risk Table                     Men   Women  1/2 Average Risk   3.4   3.3  Average Risk       5.0   4.4  2 X Average Risk   9.6   7.1  3 X Average Risk  23.4   11.0        Use the calculated Patient Ratio above and the CHD Risk Table to determine the patient's CHD Risk.        ATP III CLASSIFICATION (LDL):  <100     mg/dL   Optimal  846-962  mg/dL   Near or Above                    Optimal  130-159  mg/dL   Borderline  952-841  mg/dL   High  >324     mg/dL   Very High Performed at [facility], [address], [place], [place] [address]   Hemoglobin A1c     Status: Abnormal   Collection Time: 08/07/22 12:39 AM  Result Value Ref Range   Hgb A1c  MFr Bld 6.4 (H) 4.8 - 5.6 %    Comment: (NOTE) Pre diabetes:          5.7%-6.4%  Diabetes:              >6.4%  Glycemic control for   <7.0% adults with diabetes    Mean Plasma Glucose 136.98 mg/dL    Comment: Performed at [facility], [address] [place], [place] [address]   MR ANGIO HEAD WO CONTRAST  Result Date: 08/07/2022 CLINICAL DATA:  Acute neurologic deficit EXAM: MRA HEAD WITHOUT CONTRAST TECHNIQUE: Angiographic images of the Circle of Willis were acquired using MRA technique without intravenous contrast. COMPARISON:  None Available. FINDINGS: POSTERIOR CIRCULATION: --Vertebral arteries: Normal --Inferior cerebellar arteries: Normal. --Basilar artery: Normal. --Superior cerebellar arteries: Normal. --Posterior cerebral arteries: Normal. ANTERIOR CIRCULATION: --Intracranial internal carotid arteries: Symmetric signal loss of the petrous segments of the internal carotid arteries, suggesting artifact. The ICAs are otherwise normal. Both posterior communicating arteries are patent. --Anterior cerebral arteries (ACA): Normal. --Middle cerebral arteries (MCA): Normal. IMPRESSION: Normal intracranial MRA. Electronically Signed   By: Deatra Robinson M.D.   On: 08/07/2022  01:16   MR CERVICAL SPINE WO CONTRAST  Result Date: 08/07/2022 CLINICAL DATA:  Cervical myelopathy EXAM: MRI CERVICAL SPINE WITHOUT CONTRAST TECHNIQUE: Multiplanar, multisequence MR imaging of the cervical spine was performed. No intravenous contrast was administered. COMPARISON:  None Available. FINDINGS: Alignment: Grade 1 anterolisthesis at C4-5 and C7-T1. Vertebrae: Modic type 1 signal changes at the opposing endplates of C6-7. No acute abnormality. Cord: Normal signal and morphology. Posterior Fossa, vertebral arteries, paraspinal tissues: Negative. Disc levels: C1-2: Unremarkable. C2-3: Small disc bulge and mild facet hypertrophy with ligamentum flavum infolding. There is no spinal canal stenosis. No neural foraminal stenosis. C3-4: Intermediate sized disc osteophyte complex. Mild spinal canal stenosis. Moderate right neural foraminal stenosis. C4-5: Small disc bulge with bilateral uncovertebral hypertrophy and mild facet arthrosis. There is no spinal canal stenosis. Moderate bilateral neural foraminal stenosis. C5-6: Severe disc space loss with moderate left facet hypertrophy. There is no spinal canal stenosis. Mild right and moderate left neural foraminal stenosis. C6-7: Small disc bulge with bilateral uncovertebral hypertrophy. There is no spinal canal stenosis. Severe bilateral neural foraminal stenosis. C7-T1: Small disc bulge. There is no spinal canal stenosis. Moderate right and mild left neural foraminal stenosis. IMPRESSION: 1. Multilevel moderate-to-severe neural foraminal stenosis, worst at C6-7. 2. Mild spinal canal stenosis at C3-4. Electronically Signed   By: Deatra Robinson M.D.   On: 08/07/2022 00:53   MR BRAIN WO CONTRAST  Result Date: 08/06/2022 CLINICAL DATA:  Acute neurologic deficit EXAM: MRI HEAD WITHOUT CONTRAST TECHNIQUE: Multiplanar, multiecho pulse sequences of the brain and surrounding structures were obtained without intravenous contrast. COMPARISON:  None Available. FINDINGS:  Brain: No acute infarct, mass effect or extra-axial collection. No acute or chronic hemorrhage. There is confluent hyperintense T2-weighted signal within the white matter. Parenchymal volume and CSF spaces are normal. The midline structures are normal. Vascular: Major flow voids are preserved. Skull and upper cervical spine: Normal calvarium and skull base. Visualized upper cervical spine and soft tissues are normal. Sinuses/Orbits:No paranasal sinus fluid levels or advanced mucosal thickening. No mastoid or middle ear effusion. Normal orbits. IMPRESSION: 1. No acute intracranial abnormality. 2. Advanced chronic microangiopathic white matter changes. Electronically Signed   By: Deatra Robinson M.D.   On: 08/06/2022 21:21   CT HEAD WO CONTRAST  Result Date: 08/06/2022 CLINICAL DATA:  Transient ischemic attack. EXAM: CT HEAD  WITHOUT CONTRAST TECHNIQUE: Contiguous axial images were obtained from the base of the skull through the vertex without intravenous contrast. RADIATION DOSE REDUCTION: This exam was performed according to the departmental dose-optimization program which includes automated exposure control, adjustment of the mA and/or kV according to patient size and/or use of iterative reconstruction technique. COMPARISON:  None Available. FINDINGS: Brain: No acute hemorrhage. Patchy and confluent hypoattenuation of the cerebral white matter, most consistent with severe chronic small-vessel disease. Cortical gray-white differentiation is preserved. No hydrocephalus or extra-axial collection. No mass effect or midline shift. Vascular: No hyperdense vessel or unexpected calcification. Skull: No calvarial fracture or suspicious bone lesion. Skull base is unremarkable. Sinuses/Orbits: Unremarkable. Other: None. IMPRESSION: 1. No acute intracranial abnormality. 2. Severe chronic small-vessel disease. Electronically Signed   By: Orvan Falconer M.D.   On: 08/06/2022 15:50    Pending Labs Unresulted Labs (From  admission, onward)    None       Vitals/Pain Today's Vitals   08/06/22 2145 08/06/22 2200 08/07/22 0030 08/07/22 0130  BP: (!) 165/62  (!) 162/67 (!) 170/67  Pulse: 67 70 67 66  Resp: 14  14 16   Temp: 97.9 F (36.6 C)   97.9 F (36.6 C)  TempSrc: Oral   Oral  SpO2: 99% 100% 98% 99%  Weight:      Height:      PainSc: 0-No pain       Isolation Precautions No active isolations  Medications Medications  aspirin chewable tablet 81 mg (has no administration in time range)  lactated ringers infusion (has no administration in time range)  lidocaine (LIDODERM) 5 % 1 patch (1 patch Transdermal Patch Applied 08/07/22 0344)  sodium chloride flush (NS) 0.9 % injection 3 mL (3 mLs Intravenous Given 08/06/22 1547)    Mobility non-ambulatory     Focused Assessments Neuro Assessment Handoff:  Swallow screen pass? Yes    NIH Stroke Scale  Dizziness Present: No Headache Present: No Level of Consciousness (1a.)   : Alert, keenly responsive LOC Questions (1b. )   : Answers both questions correctly LOC Commands (1c. )   : Performs both tasks correctly Best Gaze (2. )  : Normal Visual (3. )  : No visual loss Facial Palsy (4. )    : Normal symmetrical movements Motor Arm, Left (5a. )   : No drift Motor Arm, Right (5b. ) : No drift Motor Leg, Left (6a. )  : No drift Motor Leg, Right (6b. ) : No drift Limb Ataxia (7. ): Absent Sensory (8. )  : Mild-to-moderate sensory loss, patient feels pinprick is less sharp or is dull on the affected side, or there is a loss of superficial pain with pinprick, but patient is aware of being touched Best Language (9. )  : No aphasia Dysarthria (10. ): Normal Extinction/Inattention (11.)   : No Abnormality Complete NIHSS TOTAL: 1     Neuro Assessment: Exceptions to WDL Neuro Checks:      Has TPA been given? No If patient is a Neuro Trauma and patient is going to OR before floor call report to 4N Charge nurse: [phone] or  [phone]   R Recommendations: See Admitting Provider Note  Report given to:   Additional Notes: Pt is a/o x 4 unable to ambulate

## 2022-08-07 NOTE — Assessment & Plan Note (Signed)
MRI of the head and neck

## 2022-08-08 DIAGNOSIS — I639 Cerebral infarction, unspecified: Secondary | ICD-10-CM | POA: Diagnosis not present

## 2022-08-08 LAB — GLUCOSE, CAPILLARY
Glucose-Capillary: 98 mg/dL (ref 70–99)
Glucose-Capillary: 179 mg/dL — ABNORMAL HIGH (ref 70–99)
Glucose-Capillary: 140 mg/dL — ABNORMAL HIGH (ref 70–99)
Glucose-Capillary: 184 mg/dL — ABNORMAL HIGH (ref 70–99)
Glucose-Capillary: 128 mg/dL — ABNORMAL HIGH (ref 70–99)

## 2022-08-08 MED ORDER — TRAMADOL HCL 50 MG PO TABS
50.0000 mg | ORAL_TABLET | Freq: Every day | ORAL | Status: DC
  Administered 2022-08-09 – 2022-08-10 (×2): 50 mg via ORAL
  Filled 2022-08-08 (×3): qty 1

## 2022-08-08 MED ORDER — AMLODIPINE BESYLATE 5 MG PO TABS
5.0000 mg | ORAL_TABLET | Freq: Every day | ORAL | Status: DC
  Administered 2022-08-09: 5 mg via ORAL
  Filled 2022-08-08: qty 1

## 2022-08-08 MED ORDER — TRAMADOL HCL 50 MG PO TABS: 25.0000 mg | ORAL_TABLET | ORAL | Status: DC

## 2022-08-08 MED ORDER — EMPAGLIFLOZIN 10 MG PO TABS
10.0000 mg | ORAL_TABLET | Freq: Every day | ORAL | Status: DC
  Administered 2022-08-09 – 2022-08-13 (×5): 10 mg via ORAL
  Filled 2022-08-08 (×6): qty 1

## 2022-08-08 MED ORDER — TRAMADOL HCL 50 MG PO TABS
25.0000 mg | ORAL_TABLET | Freq: Every day | ORAL | Status: DC
  Administered 2022-08-08 – 2022-08-11 (×4): 25 mg via ORAL
  Filled 2022-08-08 (×3): qty 1

## 2022-08-08 MED ORDER — CARVEDILOL 12.5 MG PO TABS
12.5000 mg | ORAL_TABLET | Freq: Two times a day (BID) | ORAL | Status: DC
  Administered 2022-08-09 – 2022-08-10 (×3): 12.5 mg via ORAL
  Filled 2022-08-08 (×3): qty 1

## 2022-08-08 NOTE — TOC Initial Note (Signed)
Transition of Care [facility]) - Initial/Assessment Note    Patient Details  Name: [name] MRN: [id number] Date of Birth: September 18, 1927  Transition of Care [facility]) CM/SW Contact:    Lorri Frederick, LCSW Phone Number: 08/08/2022, 12:31 PM  Clinical Narrative:    CSW met with pt regarding DC recommendation for SNF.  Pt is agreeable to this, medicare choice document provided, permission given to send out referral in hub.  Permission given to speak to sons Adele Schilder and Jonny Ruiz.  Pt son Adele Schilder called while CSW in the room, CSW discussed DC plan with him and he is also in agreement.  He will be by the hospital later today and will review the choices.  Pt is from home alone.  Has HH aide 1x per week.  Referral sent out in hub for SNF.               Expected Discharge Plan: Skilled Nursing Facility Barriers to Discharge: Continued Medical Work up, SNF Pending bed offer   Patient Goals and CMS Choice Patient states their goals for this hospitalization and ongoing recovery are:: do everything I've been doing CMS Medicare.gov Compare Post Acute Care list provided to:: Patient Choice offered to / list presented to : Patient      Expected Discharge Plan and Services In-house Referral: Clinical Social Work   Post Acute Care Choice: Skilled Nursing Facility Living arrangements for the past 2 months: Single Family Home                                      Prior Living Arrangements/Services Living arrangements for the past 2 months: Single Family Home Lives with:: Self Patient language and need for interpreter reviewed:: Yes Do you feel safe going back to the place where you live?: Yes      Need for Family Participation in Patient Care: Yes (Comment) Care giver support system in place?: Yes (comment) Current home services: Homehealth aide (1x per week) Criminal Activity/Legal Involvement Pertinent to Current Situation/Hospitalization: No - Comment as needed  Activities of Daily  Living      Permission Sought/Granted Permission sought to share information with : Family Supports Permission granted to share information with : Yes, Verbal Permission Granted  Share Information with NAME: sons Adele Schilder and Jonny Ruiz  Permission granted to share info w AGENCY: SNF        Emotional Assessment Appearance:: Appears stated age Attitude/Demeanor/Rapport: Engaged Affect (typically observed): Appropriate, Pleasant Orientation: : Oriented to Self, Oriented to Place, Oriented to  Time, Oriented to Situation      Admission diagnosis:  CVA (cerebral vascular accident) (HCC) [I63.9] Left hand weakness [R29.898] Numbness of left hand [R20.0] Patient Active Problem List   Diagnosis Date Noted   CVA (cerebral vascular accident) (HCC) 08/07/2022   History of CAD (coronary artery disease) of artery bypass graft 08/07/2022   Arthritis of right hip 08/06/2021   Unilateral primary osteoarthritis, right knee 08/06/2021   Closed compression fracture of body of L1 vertebra (HCC) 07/05/2021   Peripheral arterial disease (HCC) 03/17/2021   Onychomycosis 02/28/2021   Right leg pain 07/26/2020   Onychodystrophy 07/26/2020   CKD (chronic kidney disease), stage IV (HCC) 07/19/2020   Hypertensive heart and renal disease 03/22/2020   Gout 12/07/2017   DM type 2 (diabetes mellitus, type 2) (HCC) 02/11/2014   History of coronary artery bypass graft 07/01/2010   MYOCARDIAL INFARCTION, INFERIOR WALL, SUBSEQUENT  CARE 05/12/2008   CAD, NATIVE VESSEL 05/12/2008   Hyperlipidemia 05/11/2008   Essential hypertension 05/11/2008   CAD, UNSPECIFIED SITE 05/11/2008   CEREBROVASCULAR DISEASE 05/11/2008   Rheumatoid arthritis (HCC) 05/11/2008   PCP:  Nona Dell, NP Pharmacy:   [facility] [id number] [place], [place] - [address] AT [facility] [address] [place] [address] Phone: [phone] Fax: [phone]  [facility] #[id number] [place], [place] - [address] AT [facility] OF [facility] & [address] [place] [address] Phone: [phone] Fax: [phone]  [facility]ORE #[id number] [place], [place] - [address] AT [facility] OF [address] & [address] [facility] [address] Phone: [phone] Fax: [phone]     Social Determinants of Health (SDOH) Social History: SDOH Screenings   Food Insecurity: No Food Insecurity (03/27/2022)  Housing: Low Risk  (04/18/2021)  Transportation Needs: No Transportation Needs (03/27/2022)  Alcohol Screen: Low Risk  (11/14/2020)  Depression (PHQ2-9): Low Risk  (03/27/2022)  Financial Resource Strain: Low Risk  (03/27/2022)  Physical Activity: Inactive (03/27/2022)  Social Connections: Moderately Integrated (11/14/2020)  Stress: No Stress Concern Present (03/27/2022)  Tobacco Use: Low Risk  (08/07/2022)   SDOH Interventions:     Readmission Risk Interventions     No data to display

## 2022-08-08 NOTE — [place] FL2 (Signed)
[place] MEDICAID FL2 LEVEL OF CARE FORM     IDENTIFICATION  Patient Name: [name] Birthdate: September 24, 1927 Sex: female Admission Date (Current Location): 08/06/2022  [facility] and [place] Number:  Producer, television/film/video and Address:  [facility] [place], [address] [place], [place] [address]      Provider Number: [id number]  Attending Physician Name and Address:  Rhetta Mura, MD  Relative Name and Phone Number:  [place], Hag[place] [phone]  [phone]    Current Level of Care: Hospital Recommended Level of Care: Skilled Nursing Facility Prior Approval Number:    Date Approved/Denied:   PASRR Number: [id number]  Discharge Plan: SNF    Current Diagnoses: Patient Active Problem List   Diagnosis Date Noted   CVA (cerebral vascular accident) (HCC) 08/07/2022   History of CAD (coronary artery disease) of artery bypass graft 08/07/2022   Arthritis of right hip 08/06/2021   Unilateral primary osteoarthritis, right knee 08/06/2021   Closed compression fracture of body of L1 vertebra (HCC) 07/05/2021   Peripheral arterial disease (HCC) 03/17/2021   Onychomycosis 02/28/2021   Right leg pain 07/26/2020   Onychodystrophy 07/26/2020   CKD (chronic kidney disease), stage IV (HCC) 07/19/2020   Hypertensive heart and renal disease 03/22/2020   Gout 12/07/2017   DM type 2 (diabetes mellitus, type 2) (HCC) 02/11/2014   History of coronary artery bypass graft 07/01/2010   MYOCARDIAL INFARCTION, INFERIOR WALL, SUBSEQUENT CARE 05/12/2008   CAD, NATIVE VESSEL 05/12/2008   Hyperlipidemia 05/11/2008   Essential hypertension 05/11/2008   CAD, UNSPECIFIED SITE 05/11/2008   CEREBROVASCULAR DISEASE 05/11/2008   Rheumatoid arthritis (HCC) 05/11/2008    Orientation RESPIRATION BLADDER Height & Weight     Self, Time, Situation, Place  Normal Incontinent Weight: 130 lb (59 kg) Height:  5\' 4"  (162.6 cm)  BEHAVIORAL SYMPTOMS/MOOD NEUROLOGICAL BOWEL  NUTRITION STATUS      Continent Diet (see discharge summary)  AMBULATORY STATUS COMMUNICATION OF NEEDS Skin   Limited Assist Verbally Normal                       Personal Care Assistance Level of Assistance  Bathing, Feeding, Dressing Bathing Assistance: Limited assistance Feeding assistance: Limited assistance Dressing Assistance: Maximum assistance     Functional Limitations Info  Sight, Hearing, Speech Sight Info: Adequate Hearing Info: Adequate Speech Info: Adequate    SPECIAL CARE FACTORS FREQUENCY  PT (By licensed PT), OT (By licensed OT)     PT Frequency: 5x week OT Frequency: 5x week            Contractures Contractures Info: Not present    Additional Factors Info  Code Status, Allergies, Insulin Sliding Scale Code Status Info: full Allergies Info: NKA   Insulin Sliding Scale Info: Novolog: see discharge summary       Current Medications (08/08/2022):  This is the current hospital active medication list Current Facility-Administered Medications  Medication Dose Route Frequency Provider Last Rate Last Admin   acetaminophen (TYLENOL) tablet 650 mg  650 mg Oral Q6H PRN Janalyn Shy, Subrina, MD   650 mg at 08/07/22 2229   Or   acetaminophen (TYLENOL) suppository 650 mg  650 mg Rectal Q6H PRN Janalyn Shy, Subrina, MD       aspirin chewable tablet 81 mg  81 mg Oral Daily Erick Blinks, MD   81 mg at 08/08/22 3664   clopidogrel (PLAVIX) tablet 75 mg  75 mg Oral Daily Hetty Blend C, NP   75 mg  at 08/08/22 0938   enoxaparin (LOVENOX) injection 30 mg  30 mg Subcutaneous Q24H Sundil, Subrina, MD   30 mg at 08/07/22 1600   hydrALAZINE (APRESOLINE) injection 10 mg  10 mg Intravenous Q8H PRN Sundil, Subrina, MD       insulin aspart (novoLOG) injection 0-6 Units  0-6 Units Subcutaneous TID WC Sundil, Subrina, MD       lidocaine (LIDODERM) 5 % 1 patch  1 patch Transdermal Q2200 Sundil, Subrina, MD   1 patch at 08/07/22 2221   polyethylene glycol (MIRALAX / GLYCOLAX)  packet 17 g  17 g Oral Daily PRN Sundil, Subrina, MD       rosuvastatin (CRESTOR) tablet 20 mg  20 mg Oral Daily Elmer Picker, NP   20 mg at 08/08/22 7829     Discharge Medications: Please see discharge summary for a list of discharge medications.  Relevant Imaging Results:  Relevant Lab Results:   Additional Information SSN: [id number]  Lorri Frederick, LCSW

## 2022-08-08 NOTE — Progress Notes (Signed)
PROGRESS NOTE   [name]  IEP:[id number] DOB: 10-25-1927 DOA: 08/06/2022 PCP: Shannon Dell, NP  Brief Narrative:   89 year old black female, predominantly wheelchair-bound since 09/2021 Distant CABG 2003 ask for Dr. Lavinia Houston + CVA of left brain with right hemiparesis and aphasia-last cath 06/2010 patent grafts Significant hip arthritis follows with Dr. Linna Houston- Underlying CKD 4 baseline creatinine 2.4 DM TY 2 Prior pression fracture L1 07/2021 Dr. Ophelia Houston follows Peripheral arterial disease?  Follows with Dr. Linden Houston good flow on noninvasive testing as of 08/2020 (ABI  R1.4, L1.1)   She presented with sudden left hand weakness at around 2 AM on 6/19 with left upper extremity weakness noticed her hand was dragging had to use right hand-has previously been told she has right lower extremity weakness with sciatica-seen by neurology who recommended admission-note taken off aspirin about 2 months prior by PCP after having been on it  Hospital-Problem based course  TIA MRI negative for stroke--CVA 2000 Aspirin Plavix 21 days and then discontinue Plavix July 10 Secondary prevention measures A1c 6.4 LDL is 91 EF 60-65% Statin Crestor 20  DM TY 2 well-controlled A1c 6.4 Resume Jardiance 10  HTN-has MRI negative for stroke can resume Coreg and amlodipine-would not use chlorthalidone given salt wasting that typically occurs with chlorthalidone Would hesitate to use losartan in a 89 year old can be reinitiated in the outpatient per PCP  DVT prophylaxis: Lovenox Code Status: Full Family Communication: None present Disposition:  Status is: Inpatient Remains inpatient appropriate because:   Requires placement to skilled facility   Subjective: Awake coherent no distress looks fair somewhat weaker on the left side than the right otherwise looks well Eating drinking  Objective: Vitals:   08/08/22 0332 08/08/22 0802 08/08/22 1205 08/08/22 1524  BP: (!) 189/66 (!) 168/82 (!)  151/63 134/65  Pulse: (!) 58 (!) 56 65 73  Resp: 16 18 19 17   Temp: 97.6 F (36.4 C) 97.7 F (36.5 C) 99 F (37.2 C) 98.7 F (37.1 C)  TempSrc: Oral Oral Oral Oral  SpO2: 100% 100% 100% 100%  Weight:      Height:        Intake/Output Summary (Last 24 hours) at 08/08/2022 1807 Last data filed at 08/08/2022 6606 Gross per 24 hour  Intake 1429.26 ml  Output 800 ml  Net 629.26 ml   Filed Weights   08/06/22 1449  Weight: 59 kg    Examination:  Dense left arm deficit with inability to squeeze my fingers however is able to raise the arm anteriorly and laterally Chest is clear no wheeze rales rhonchi Abdomen soft Neuro intact no other focal deficit S1 s2 no m/r/g   Data Reviewed: personally reviewed   CBC    Component Value Date/Time   WBC 5.2 08/07/2022 0039   RBC 3.41 (L) 08/07/2022 0039   HGB 11.0 (L) 08/07/2022 0039   HGB 10.8 (L) 11/14/2020 1524   HCT 33.5 (L) 08/07/2022 0039   HCT 32.8 (L) 11/14/2020 1524   PLT 210 08/07/2022 0039   PLT 206 11/14/2020 1524   MCV 98.2 08/07/2022 0039   MCV 97 11/14/2020 1524   MCH 32.3 08/07/2022 0039   MCHC 32.8 08/07/2022 0039   RDW 11.9 08/07/2022 0039   RDW 13.1 11/14/2020 1524   LYMPHSABS 1.1 08/06/2022 1500   MONOABS 0.5 08/06/2022 1500   EOSABS 0.1 08/06/2022 1500   BASOSABS 0.0 08/06/2022 1500      Latest Ref Rng & Units 08/07/2022   12:39 AM 08/06/2022  3:06 PM 08/06/2022    3:00 PM  CMP  Glucose 70 - 99 mg/dL 87  782  956   BUN 8 - 23 mg/dL 43  50  51   Creatinine 0.44 - 1.00 mg/dL 2.13  0.86  5.78   Sodium 135 - 145 mmol/L 141  143  142   Potassium 3.5 - 5.1 mmol/L 3.4  3.8  3.8   Chloride 98 - 111 mmol/L 110  106  104   CO2 22 - 32 mmol/L 23   25   Calcium 8.9 - 10.3 mg/dL 8.1   9.6   Total Protein 6.5 - 8.1 g/dL 5.3   7.2   Total Bilirubin 0.3 - 1.2 mg/dL 0.7   0.4   Alkaline Phos 38 - 126 U/L 51   72   AST 15 - 41 U/L 18   20   ALT 0 - 44 U/L 9   13      Radiology Studies: ECHOCARDIOGRAM  COMPLETE  Result Date: 08/07/2022    ECHOCARDIOGRAM REPORT   [name] Date of Exam: 08/07/2022 Medical Rec #:  [id number]       Height:       64.0 in Accession #:    [id number]      Weight:       130.0 lb Date of Birth:  12-18-1927       BSA:          1.629 m Patient Age:    89 years        BP:           156/70 mmHg Patient Gender: F               HR:           58 bpm. Exam Location:  Inpatient Procedure: 2D Echo, Cardiac Doppler and Color Doppler Indications:    stroke  History:        Patient has prior history of Echocardiogram examinations, most                 recent 10/08/2001. Prior CABG, chronic kidney disease, PAD and                 Stroke; Risk Factors:Hypertension, Dyslipidemia and Diabetes.  Sonographer:    Delcie Roch RDCS Referring Phys: [id number] [facility] IMPRESSIONS  1. Left ventricular ejection fraction, by estimation, is 60 to 65%. The left ventricle has normal function. The left ventricle has no regional wall motion abnormalities. There is mild left ventricular hypertrophy. Left ventricular diastolic parameters are consistent with Grade I diastolic dysfunction (impaired relaxation).  2. Right ventricular systolic function is normal. The right ventricular size is normal. There is normal pulmonary artery systolic pressure.  3. The mitral valve is grossly normal. Mild mitral valve regurgitation.  4. The aortic valve is grossly normal. Aortic valve regurgitation is not visualized.  5. The inferior vena cava is normal in size with greater than 50% respiratory variability, suggesting right atrial pressure of 3 mmHg. FINDINGS  Left Ventricle: Left ventricular ejection fraction, by estimation, is 60 to 65%. The left ventricle has normal function. The left ventricle has no regional wall motion abnormalities. The left ventricular internal cavity size was normal in size. There is  mild left ventricular hypertrophy. Left ventricular diastolic parameters are consistent with  Grade I diastolic dysfunction (impaired relaxation). Right Ventricle: The right ventricular size is normal. Right ventricular systolic function is normal. There is normal  pulmonary artery systolic pressure. The tricuspid regurgitant velocity is 2.81 m/s, and with an assumed right atrial pressure of 3 mmHg,  the estimated right ventricular systolic pressure is 34.6 mmHg. Left Atrium: Left atrial size was normal in size. Right Atrium: Right atrial size was normal in size. Pericardium: There is no evidence of pericardial effusion. Mitral Valve: The mitral valve is grossly normal. Mild mitral valve regurgitation. Tricuspid Valve: The tricuspid valve is normal in structure. Tricuspid valve regurgitation is mild. Aortic Valve: The aortic valve is grossly normal. Aortic valve regurgitation is not visualized. Aortic valve mean gradient measures 4.0 mmHg. Aortic valve peak gradient measures 8.6 mmHg. Aortic valve area, by VTI measures 1.83 cm. Pulmonic Valve: Pulmonic valve regurgitation is not visualized. Aorta: The aortic root and ascending aorta are structurally normal, with no evidence of dilitation. Venous: The inferior vena cava is normal in size with greater than 50% respiratory variability, suggesting right atrial pressure of 3 mmHg. IAS/Shunts: The interatrial septum was not assessed.  LEFT VENTRICLE PLAX 2D LVIDd:         4.30 cm   Diastology LVIDs:         3.00 cm   LV e' medial:    3.15 cm/s LV PW:         1.05 cm   LV E/e' medial:  22.7 LV IVS:        1.20 cm   LV e' lateral:   6.20 cm/s LVOT diam:     1.80 cm   LV E/e' lateral: 11.5 LV SV:         57 LV SV Index:   35 LVOT Area:     2.54 cm  RIGHT VENTRICLE            IVC RV S prime:     8.49 cm/s  IVC diam: 1.40 cm TAPSE (M-mode): 2.3 cm LEFT ATRIUM             Index        RIGHT ATRIUM          Index LA diam:        2.70 cm 1.66 cm/m   RA Area:     9.05 cm LA Vol (A2C):   45.0 ml 27.62 ml/m  RA Volume:   16.80 ml 10.31 ml/m LA Vol (A4C):   35.7 ml 21.91  ml/m LA Biplane Vol: 45.6 ml 27.99 ml/m  AORTIC VALVE AV Area (Vmax):    1.61 cm AV Area (Vmean):   1.78 cm AV Area (VTI):     1.83 cm AV Vmax:           147.00 cm/s AV Vmean:          92.200 cm/s AV VTI:            0.311 m AV Peak Grad:      8.6 mmHg AV Mean Grad:      4.0 mmHg LVOT Vmax:         93.20 cm/s LVOT Vmean:        64.600 cm/s LVOT VTI:          0.224 m LVOT/AV VTI ratio: 0.72  AORTA Ao Root diam: 2.70 cm Ao Asc diam:  2.10 cm MITRAL VALVE                TRICUSPID VALVE MV Area (PHT): 2.29 cm     TR Peak grad:   31.6 mmHg MV Decel Time: 331 msec  TR Vmax:        281.00 cm/s MR Peak grad: 141.6 mmHg MR Mean grad: 91.0 mmHg     SHUNTS MR Vmax:      595.00 cm/s   Systemic VTI:  0.22 m MR Vmean:     454.0 cm/s    Systemic Diam: 1.80 cm MV E velocity: 71.60 cm/s MV A velocity: 126.00 cm/s MV E/A ratio:  0.57 Carolan Clines Electronically signed by Carolan Clines Signature Date/Time: 08/07/2022/12:25:17 PM    Final    VAS US CAROTID  Result Date: 08/07/2022 Carotid Arterial Duplex Study Patient Name:  Bradee Stegeman  Date of Exam:   08/07/2022 Medical Rec #: [id number]        Accession #:    [id number] Date of Birth: 1927/11/16        Patient Gender: F Patient Age:   9 years Exam Location:  [facility] Procedure:      VAS US CAROTID Referring Phys: Terrilee Files [facility] --------------------------------------------------------------------------------  Indications:       Weakness. Risk Factors:      Hypertension, hyperlipidemia, Diabetes, prior MI, coronary                    artery disease. Other Factors:     CKD !V. Limitations        Today's exam was limited due to Kyphosis. Comparison Study:  Prior carotid duplex indicating bilateral 1-39% ICA stenosis                    done 12/08/17 Performing Technologist: Sherren Kerns RVS  Examination Guidelines: A complete evaluation includes B-mode imaging, spectral Doppler, color Doppler, and power Doppler as needed of all accessible portions of each  vessel. Bilateral testing is considered an integral part of a complete examination. Limited examinations for reoccurring indications may be performed as noted.  Right Carotid Findings: +----------+--------+--------+--------+------------------+------------------+           PSV cm/sEDV cm/sStenosisPlaque DescriptionComments           +----------+--------+--------+--------+------------------+------------------+ CCA Prox  65      9                                 intimal thickening +----------+--------+--------+--------+------------------+------------------+ CCA Distal50      12                                intimal thickening +----------+--------+--------+--------+------------------+------------------+ ICA Prox  53      8       1-39%   heterogenous                         +----------+--------+--------+--------+------------------+------------------+ ICA Mid   53      10                                                   +----------+--------+--------+--------+------------------+------------------+ ICA Distal44      12                                                   +----------+--------+--------+--------+------------------+------------------+  ECA       86      3                                                    +----------+--------+--------+--------+------------------+------------------+ +----------+--------+-------+--------+-------------------+           PSV cm/sEDV cmsDescribeArm Pressure (mmHG) +----------+--------+-------+--------+-------------------+ [id number]                                         +----------+--------+-------+--------+-------------------+ +---------+--------+--+--------+--+ VertebralPSV cm/s51EDV cm/s11 +---------+--------+--+--------+--+  Left Carotid Findings: +----------+--------+--------+--------+------------------+------------------+           PSV cm/sEDV cm/sStenosisPlaque DescriptionComments            +----------+--------+--------+--------+------------------+------------------+ CCA Prox  64      9                                 intimal thickening +----------+--------+--------+--------+------------------+------------------+ CCA Distal69      14                                intimal thickening +----------+--------+--------+--------+------------------+------------------+ ICA Prox  93      22      1-39%   heterogenous                         +----------+--------+--------+--------+------------------+------------------+ ICA Mid   94      17                                                   +----------+--------+--------+--------+------------------+------------------+ ICA Distal74      16                                                   +----------+--------+--------+--------+------------------+------------------+ ECA       74      4                                                    +----------+--------+--------+--------+------------------+------------------+ +----------+--------+--------+--------+-------------------+           PSV cm/sEDV cm/sDescribeArm Pressure (mmHG) +----------+--------+--------+--------+-------------------+ Subclavian102                                         +----------+--------+--------+--------+-------------------+ +---------+--------+--+--------+--+ VertebralPSV cm/s50EDV cm/s13 +---------+--------+--+--------+--+   Summary: Right Carotid: Velocities in the right ICA are consistent with a 1-39% stenosis. Left Carotid: Velocities in the left ICA are consistent with a 1-39% stenosis. Vertebrals:  Bilateral vertebral arteries demonstrate antegrade flow. Subclavians: Normal flow hemodynamics were seen in bilateral subclavian  arteries. *See table(s) above for measurements and observations.     Preliminary    MR ANGIO HEAD WO CONTRAST  Result Date: 08/07/2022 CLINICAL DATA:  Acute neurologic deficit EXAM: MRA HEAD  WITHOUT CONTRAST TECHNIQUE: Angiographic images of the Circle of Willis were acquired using MRA technique without intravenous contrast. COMPARISON:  None Available. FINDINGS: POSTERIOR CIRCULATION: --Vertebral arteries: Normal --Inferior cerebellar arteries: Normal. --Basilar artery: Normal. --Superior cerebellar arteries: Normal. --Posterior cerebral arteries: Normal. ANTERIOR CIRCULATION: --Intracranial internal carotid arteries: Symmetric signal loss of the petrous segments of the internal carotid arteries, suggesting artifact. The ICAs are otherwise normal. Both posterior communicating arteries are patent. --Anterior cerebral arteries (ACA): Normal. --Middle cerebral arteries (MCA): Normal. IMPRESSION: Normal intracranial MRA. Electronically Signed   By: Deatra Robinson M.D.   On: 08/07/2022 01:16   MR CERVICAL SPINE WO CONTRAST  Result Date: 08/07/2022 CLINICAL DATA:  Cervical myelopathy EXAM: MRI CERVICAL SPINE WITHOUT CONTRAST TECHNIQUE: Multiplanar, multisequence MR imaging of the cervical spine was performed. No intravenous contrast was administered. COMPARISON:  None Available. FINDINGS: Alignment: Grade 1 anterolisthesis at C4-5 and C7-T1. Vertebrae: Modic type 1 signal changes at the opposing endplates of C6-7. No acute abnormality. Cord: Normal signal and morphology. Posterior Fossa, vertebral arteries, paraspinal tissues: Negative. Disc levels: C1-2: Unremarkable. C2-3: Small disc bulge and mild facet hypertrophy with ligamentum flavum infolding. There is no spinal canal stenosis. No neural foraminal stenosis. C3-4: Intermediate sized disc osteophyte complex. Mild spinal canal stenosis. Moderate right neural foraminal stenosis. C4-5: Small disc bulge with bilateral uncovertebral hypertrophy and mild facet arthrosis. There is no spinal canal stenosis. Moderate bilateral neural foraminal stenosis. C5-6: Severe disc space loss with moderate left facet hypertrophy. There is no spinal canal stenosis. Mild  right and moderate left neural foraminal stenosis. C6-7: Small disc bulge with bilateral uncovertebral hypertrophy. There is no spinal canal stenosis. Severe bilateral neural foraminal stenosis. C7-T1: Small disc bulge. There is no spinal canal stenosis. Moderate right and mild left neural foraminal stenosis. IMPRESSION: 1. Multilevel moderate-to-severe neural foraminal stenosis, worst at C6-7. 2. Mild spinal canal stenosis at C3-4. Electronically Signed   By: Deatra Robinson M.D.   On: 08/07/2022 00:53   MR BRAIN WO CONTRAST  Result Date: 08/06/2022 CLINICAL DATA:  Acute neurologic deficit EXAM: MRI HEAD WITHOUT CONTRAST TECHNIQUE: Multiplanar, multiecho pulse sequences of the brain and surrounding structures were obtained without intravenous contrast. COMPARISON:  None Available. FINDINGS: Brain: No acute infarct, mass effect or extra-axial collection. No acute or chronic hemorrhage. There is confluent hyperintense T2-weighted signal within the white matter. Parenchymal volume and CSF spaces are normal. The midline structures are normal. Vascular: Major flow voids are preserved. Skull and upper cervical spine: Normal calvarium and skull base. Visualized upper cervical spine and soft tissues are normal. Sinuses/Orbits:No paranasal sinus fluid levels or advanced mucosal thickening. No mastoid or middle ear effusion. Normal orbits. IMPRESSION: 1. No acute intracranial abnormality. 2. Advanced chronic microangiopathic white matter changes. Electronically Signed   By: Deatra Robinson M.D.   On: 08/06/2022 21:21     Scheduled Meds:  aspirin  81 mg Oral Daily   clopidogrel  75 mg Oral Daily   enoxaparin (LOVENOX) injection  30 mg Subcutaneous Q24H   insulin aspart  0-6 Units Subcutaneous TID WC   lidocaine  1 patch Transdermal Q2200   rosuvastatin  20 mg Oral Daily   Continuous Infusions:   LOS: 1 day   Time spent: 96  Rhetta Mura, MD [facility] To contact the attending  provider between  7A-7P or the covering provider during after hours 7P-7A, please log into the web site [email or web address] and access using universal [place] password for that web site. If you do not have the password, please call the hospital operator.  08/08/2022, 6:07 PM

## 2022-08-09 DIAGNOSIS — I639 Cerebral infarction, unspecified: Secondary | ICD-10-CM | POA: Diagnosis not present

## 2022-08-09 LAB — GLUCOSE, CAPILLARY
Glucose-Capillary: 131 mg/dL — ABNORMAL HIGH (ref 70–99)
Glucose-Capillary: 176 mg/dL — ABNORMAL HIGH (ref 70–99)
Glucose-Capillary: 215 mg/dL — ABNORMAL HIGH (ref 70–99)

## 2022-08-09 MED ORDER — ASPIRIN 81 MG PO TBEC
81.0000 mg | DELAYED_RELEASE_TABLET | Freq: Every day | ORAL | Status: DC
  Administered 2022-08-10 – 2022-08-13 (×4): 81 mg via ORAL
  Filled 2022-08-09 (×4): qty 1

## 2022-08-09 NOTE — Plan of Care (Signed)
  Problem: Education: Goal: Knowledge of General Education information will improve Description: Including pain rating scale, medication(s)/side effects and non-pharmacologic comfort measures 08/09/2022 1742 by Wylie Hail, RN Outcome: Progressing 08/09/2022 1710 by Wylie Hail, RN Outcome: Progressing   Problem: Health Behavior/Discharge Planning: Goal: Ability to manage health-related needs will improve 08/09/2022 1742 by Wylie Hail, RN Outcome: Progressing 08/09/2022 1710 by Wylie Hail, RN Outcome: Progressing   Problem: Clinical Measurements: Goal: Ability to maintain clinical measurements within normal limits will improve 08/09/2022 1742 by Wylie Hail, RN Outcome: Progressing 08/09/2022 1710 by Wylie Hail, RN Outcome: Progressing Goal: Will remain free from infection 08/09/2022 1742 by Wylie Hail, RN Outcome: Progressing 08/09/2022 1710 by Wylie Hail, RN Outcome: Progressing Goal: Diagnostic test results will improve 08/09/2022 1742 by Wylie Hail, RN Outcome: Progressing 08/09/2022 1710 by Wylie Hail, RN Outcome: Progressing Goal: Respiratory complications will improve 08/09/2022 1742 by Wylie Hail, RN Outcome: Progressing 08/09/2022 1710 by Wylie Hail, RN Outcome: Progressing Goal: Cardiovascular complication will be avoided 08/09/2022 1742 by Wylie Hail, RN Outcome: Progressing 08/09/2022 1710 by Wylie Hail, RN Outcome: Progressing   Problem: Activity: Goal: Risk for activity intolerance will decrease 08/09/2022 1742 by Wylie Hail, RN Outcome: Progressing 08/09/2022 1710 by Wylie Hail, RN Outcome: Progressing   Problem: Nutrition: Goal: Adequate nutrition will be maintained 08/09/2022 1742 by Wylie Hail, RN Outcome: Progressing 08/09/2022 1710 by Wylie Hail, RN Outcome: Progressing   Problem: Coping: Goal: Level of anxiety will decrease 08/09/2022 1742 by Wylie Hail, RN Outcome:  Progressing 08/09/2022 1710 by Wylie Hail, RN Outcome: Progressing   Problem: Elimination: Goal: Will not experience complications related to bowel motility 08/09/2022 1742 by Wylie Hail, RN Outcome: Progressing 08/09/2022 1710 by Wylie Hail, RN Outcome: Progressing Goal: Will not experience complications related to urinary retention 08/09/2022 1742 by Wylie Hail, RN Outcome: Progressing 08/09/2022 1710 by Wylie Hail, RN Outcome: Progressing   Problem: Pain Managment: Goal: General experience of comfort will improve 08/09/2022 1742 by Wylie Hail, RN Outcome: Progressing 08/09/2022 1710 by Wylie Hail, RN Outcome: Progressing   Problem: Safety: Goal: Ability to remain free from injury will improve 08/09/2022 1742 by Wylie Hail, RN Outcome: Progressing 08/09/2022 1710 by Wylie Hail, RN Outcome: Progressing   Problem: Skin Integrity: Goal: Risk for impaired skin integrity will decrease 08/09/2022 1742 by Wylie Hail, RN Outcome: Progressing 08/09/2022 1710 by Wylie Hail, RN Outcome: Progressing

## 2022-08-09 NOTE — Progress Notes (Signed)
PROGRESS NOTE   [name]  WUJ:[id number] DOB: Sep 27, 1927 DOA: 08/06/2022 PCP: Nona Dell, NP  Brief Narrative:   89 year old black female, predominantly wheelchair-bound since 09/2021 Distant CABG 2003 ask for Dr. 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 Linna Caprice- Underlying CKD 4 baseline creatinine 2.4 DM TY 2 Prior pression fracture L1 07/2021 Dr. Ophelia Charter follows Peripheral arterial disease?  Follows with Dr. Linden Dolin good flow on noninvasive testing as of 08/2020 (ABI  R1.4, L1.1)   She presented with sudden left hand weakness at around 2 AM on 6/19 with left upper extremity weakness noticed her hand was dragging had to use right hand-has previously been told she has right lower extremity weakness with sciatica-seen by neurology who recommended admission-note taken off aspirin about 2 months prior by PCP after having been on it  Continues to await skilled placement  Hospital-Problem based course  TIA MRI negative for stroke--CVA 2000 Aspirin Plavix 21 days and then discontinue Plavix July 10 Secondary prevention measures A1c 6.4 LDL is 91 EF 60-65% Statin Crestor 20   DM TY 2 well-controlled A1c 6.4 Resume Jardiance 10 Stop all blood glucose checks at this time    HTN-has MRI negative for stroke can resume Coreg and amlodipine-would not use chlorthalidone given salt wasting that typically occurs with chlorthalidone At the time stop losartan in a 89 year old--would probably place on only as needed blood pressure meds in the outpatient setting  DVT prophylaxis: Lovenox Code Status: Full Family Communication: None present Disposition:  Status is: Inpatient Remains inpatient appropriate because:   Requires placement to skilled facility   Subjective:  Staff celebrated her birthday with her today-she seems quite pleased today Still has some deficits on the left side  but seems to be improved Otherwise quite stable  Objective: Vitals:   08/09/22 0349 08/09/22 0828 08/09/22 1137 08/09/22 1543  BP: (!) 145/63 (!) 108/47 (!) 122/43 (!) 107/45  Pulse: 77 66 (!) 56 60  Resp: 16 16 17 17   Temp: 99.5 F (37.5 C) 98 F (36.7 C) 97.9 F (36.6 C) (!) 97.5 F (36.4 C)  TempSrc: Oral Oral Oral Oral  SpO2: 100% 99% 100% 99%  Weight:      Height:        Intake/Output Summary (Last 24 hours) at 08/09/2022 1557 Last data filed at 08/09/2022 0013 Gross per 24 hour  Intake --  Output 400 ml  Net -400 ml    Filed Weights   08/06/22 1449  Weight: 59 kg    Examination:  Deficit on left side is improved-able to squeeze my fingers little bit-can cross midline touch opposite shoulder can externally rotate at left shoulder and flex at deltoid to be able to scratch upper neck-overall improved from prior exam S1-S2 no murmur Abdomen soft No lower extremity edema Smile symmetric Power as above with somewhat improved deficit   Data Reviewed: personally reviewed   CBC    Component Value Date/Time   WBC 5.2 08/07/2022 0039   RBC 3.41 (L) 08/07/2022 0039   HGB 11.0 (L) 08/07/2022 0039   HGB 10.8 (L) 11/14/2020 1524   HCT 33.5 (L) 08/07/2022 0039   HCT 32.8 (L) 11/14/2020 1524   PLT 210 08/07/2022 0039   PLT 206 11/14/2020 1524   MCV 98.2 08/07/2022 0039   MCV 97 11/14/2020 1524   MCH 32.3 08/07/2022 0039   MCHC 32.8 08/07/2022 0039   RDW 11.9 08/07/2022 0039  RDW 13.1 11/14/2020 1524   LYMPHSABS 1.1 08/06/2022 1500   MONOABS 0.5 08/06/2022 1500   EOSABS 0.1 08/06/2022 1500   BASOSABS 0.0 08/06/2022 1500      Latest Ref Rng & Units 08/07/2022   12:39 AM 08/06/2022    3:06 PM 08/06/2022    3:00 PM  CMP  Glucose 70 - 99 mg/dL 87  956  213   BUN 8 - 23 mg/dL 43  50  51   Creatinine 0.44 - 1.00 mg/dL 0.86  5.78  4.69   Sodium 135 - 145 mmol/L 141  143  142   Potassium 3.5 - 5.1 mmol/L 3.4  3.8  3.8   Chloride 98 - 111 mmol/L 110  106  104    CO2 22 - 32 mmol/L 23   25   Calcium 8.9 - 10.3 mg/dL 8.1   9.6   Total Protein 6.5 - 8.1 g/dL 5.3   7.2   Total Bilirubin 0.3 - 1.2 mg/dL 0.7   0.4   Alkaline Phos 38 - 126 U/L 51   72   AST 15 - 41 U/L 18   20   ALT 0 - 44 U/L 9   13      Radiology Studies: No results found.   Scheduled Meds:  amLODipine  5 mg Oral Daily   [START ON 08/10/2022] aspirin EC  81 mg Oral Daily   carvedilol  12.5 mg Oral BID WC   clopidogrel  75 mg Oral Daily   empagliflozin  10 mg Oral Daily   enoxaparin (LOVENOX) injection  30 mg Subcutaneous Q24H   insulin aspart  0-6 Units Subcutaneous TID WC   lidocaine  1 patch Transdermal Q2200   rosuvastatin  20 mg Oral Daily   traMADol  25 mg Oral Daily   traMADol  50 mg Oral QHS   Continuous Infusions:   LOS: 2 days   Time spent: 19  Rhetta Mura, MD [facility] To contact the attending provider between 7A-7P or the covering provider during after hours 7P-7A, please log into the web site [email or web address] and access using universal [place] password for that web site. If you do not have the password, please call the hospital operator.  08/09/2022, 3:57 PM

## 2022-08-09 NOTE — Plan of Care (Signed)

## 2022-08-10 DIAGNOSIS — I639 Cerebral infarction, unspecified: Secondary | ICD-10-CM | POA: Diagnosis not present

## 2022-08-10 LAB — BASIC METABOLIC PANEL
Anion gap: 9 (ref 5–15)
BUN: 62 mg/dL — ABNORMAL HIGH (ref 8–23)
CO2: 28 mmol/L (ref 22–32)
Calcium: 8.9 mg/dL (ref 8.9–10.3)
Chloride: 98 mmol/L (ref 98–111)
Creatinine, Ser: 2.23 mg/dL — ABNORMAL HIGH (ref 0.44–1.00)
GFR, Estimated: 20 mL/min — ABNORMAL LOW (ref >60)
Glucose, Bld: 122 mg/dL — ABNORMAL HIGH (ref 70–99)
Potassium: 4.8 mmol/L (ref 3.5–5.1)
Sodium: 135 mmol/L (ref 135–145)

## 2022-08-10 LAB — GLUCOSE, CAPILLARY
Glucose-Capillary: 124 mg/dL — ABNORMAL HIGH (ref 70–99)
Glucose-Capillary: 206 mg/dL — ABNORMAL HIGH (ref 70–99)
Glucose-Capillary: 183 mg/dL — ABNORMAL HIGH (ref 70–99)
Glucose-Capillary: 327 mg/dL — ABNORMAL HIGH (ref 70–99)

## 2022-08-10 MED ORDER — CARVEDILOL 6.25 MG PO TABS
6.2500 mg | ORAL_TABLET | Freq: Two times a day (BID) | ORAL | Status: DC
  Administered 2022-08-10: 6.25 mg via ORAL
  Filled 2022-08-10 (×2): qty 1

## 2022-08-10 NOTE — Progress Notes (Signed)
Physical Therapy Treatment Patient Details Name: [name] MRN: [id number] DOB: [birth year] Today's Date: 08/10/2022   History of Present Illness 89 year old female presented to [facility] ED 08/06/2022 with L upper arm weakness. CT head no acute intracranial abnormality and severe chronic small vessel disease. MRI no acute intracranial abnormality. PMH: CVA in 2003, CAD s/p CABG, HLD, HTN, CKD stage IV, lumbar radiculopathy, DM2 non-insulin-dependent, and closed wedge compression fracture of L1 vertebra with routine healing    PT Comments    Session performed to re-assess pt's PT needs at d/c per MD request as pt has been improving over the past few days. Pt is demonstrating gradual progress in strength and activity tolerance, needing only min guard assist for bed mobility and minA to transfer to stand. However, pt still requires extensive assistance of modA to ambulate short bedroom distances, progressing to ~17 ft distance today. She continues to have difficulty initially placing her R foot flat on the ground and fully extending her knee but as distance progressed this improved. Thought it was hamstring or grastroc tightness causing this presentation, thus provided PROM to these muscles after ambulating, but her flexibility seemed to be fine. She maintains a very flexed posture and rests her forearms on the RW when ambulating, likely due to core and UE muscular strength and endurance deficits, thus performed several exercises to address her weakness at end of session. Pt is at high risk for falls, lives alone, and has had a functional decline of needing modA to ambulate with a RW now compared to being able to ambulate without assistance a few weeks ago, thus I do believe pt could benefit from inpatient rehab, <3 hours/day. Pt in agreement. Will continue to follow acutely.     Recommendations for follow up therapy are one component of a multi-disciplinary discharge planning process, led by the attending  physician.  Recommendations may be updated based on patient status, additional functional criteria and insurance authorization.  Follow Up Recommendations  Can patient physically be transported by private vehicle: No    Assistance Recommended at Discharge Frequent or constant Supervision/Assistance  Patient can return home with the following A lot of help with walking and/or transfers;A lot of help with bathing/dressing/bathroom;Assistance with cooking/housework;Direct supervision/assist for medications management;Assist for transportation;Help with stairs or ramp for entrance   Equipment Recommendations  Hospital bed    Recommendations for Other Services       Precautions / Restrictions Precautions Precautions: Fall Precaution Comments: LUE weakness more distal then proximal Restrictions Weight Bearing Restrictions: No     Mobility  Bed Mobility Overal bed mobility: Needs Assistance Bed Mobility: Supine to Sit     Supine to sit: Min guard, HOB elevated     General bed mobility comments: Several minutes and cues to scoot hips to sit up EOB using bed rail with HOB elevated to exit R side of bed, min guard for safety    Transfers Overall transfer level: Needs assistance Equipment used: Rolling walker (2 wheels) Transfers: Sit to/from Stand Sit to Stand: Min assist           General transfer comment: Pt needing minA to power up to stand and steady herself with RW, extra time to get her R leg to extend and get her R foot flat on the ground, progressively flexing more at the trunk and resting forearms on the RW.    Ambulation/Gait Ambulation/Gait assistance: Mod assist Gait Distance (Feet): 17 Feet Assistive device: Rolling walker (2 wheels) Gait Pattern/deviations: Step-through pattern, Decreased  step length - right, Decreased step length - left, Decreased stride length, Decreased dorsiflexion - right, Knee flexed in stance - right, Shuffle, Narrow base of support,  Trunk flexed Gait velocity: decr Gait velocity interpretation: <1.31 ft/sec, indicative of household ambulator   General Gait Details: pt with significant flexion posture with ROM limitations at bil hips and unable to stand upright preferring to lean forearms on walker for support. Rt knee flexed in stance and unable to place Rt heel to ground initially, but this improved and she was able to extend the R knee and place R foot flat on the ground as distance progressed. BOS very narrow with Rt LE externally rotated and heel contacting Lt foot. Verbal cues to widen BOS and stand upright and remain proximal within the RW. ModA for balance throughout.   Stairs             Wheelchair Mobility    Modified Rankin (Stroke Patients Only)       Balance Overall balance assessment: Needs assistance Sitting-balance support: Feet supported Sitting balance-[name]: Fair     Standing balance support: During functional activity, Reliant on assistive device for balance, Bilateral upper extremity supported Standing balance-[name]: Poor Standing balance comment: pt reliant on external support and RW for standing balance                            Cognition Arousal/Alertness: Awake/alert Behavior During Therapy: WFL for tasks assessed/performed Overall Cognitive Status: Within Functional Limits for tasks assessed                                 General Comments: pt pleasant, high interest in therapy and aware of deficits.        Exercises General Exercises - Upper Extremity Shoulder Flexion: AROM, Both, 10 reps, Seated, Strengthening (<90') General Exercises - Lower Extremity Long Arc Quad: AROM, Both, 10 reps, Seated, Strengthening Hip ABduction/ADduction: AROM, Strengthening, Both, 10 reps, Seated (pillow between knees, manual resistance from PT with abduction) Other Exercises Other Exercises: modified sit-ups with pt rising from back of recliner with  it reclined back ~1/2 way, 10x Other Exercises: scapular retraction and cervical extension, 10x Other Exercises: PROM to R hamstring and gastroc 3x seated in chair    General Comments General comments (skin integrity, edema, etc.): discussed d/c options in which if pt chooses to go home then would recommend she perform squat pivot transfers to/from w/c and not ambulate unless someone was there to assist her or go to SNF for rehab, pt prefers SNF for rehab      Pertinent Vitals/Pain Pain Assessment Pain Assessment: Faces Faces Pain Scale: Hurts little more Pain Location: lower back Pain Descriptors / Indicators: Discomfort, Grimacing, Guarding Pain Intervention(s): Limited activity within patient's tolerance, Monitored during session, Repositioned    Home Living                          Prior Function            PT Goals (current goals can now be found in the care plan section) Acute Rehab PT Goals Patient Stated Goal: get stronger with walking and do rehab PT Goal Formulation: With patient Time For Goal Achievement: 08/21/22 Potential to Achieve Goals: Fair Progress towards PT goals: Progressing toward goals    Frequency    Min 2X/week  PT Plan Equipment recommendations need to be updated    Co-evaluation              AM-PAC PT "6 Clicks" Mobility   Outcome Measure  Help needed turning from your back to your side while in a flat bed without using bedrails?: A Little Help needed moving from lying on your back to sitting on the side of a flat bed without using bedrails?: A Little Help needed moving to and from a bed to a chair (including a wheelchair)?: A Lot Help needed standing up from a chair using your arms (e.g., wheelchair or bedside chair)?: A Little Help needed to walk in hospital room?: Total (<20 ft) Help needed climbing 3-5 steps with a railing? : Total 6 Click Score: 13    End of Session Equipment Utilized During Treatment: Gait  belt Activity Tolerance: Patient tolerated treatment well Patient left: in chair;with call bell/phone within reach;with chair alarm set   PT Visit Diagnosis: Muscle weakness (generalized) (M62.81);Difficulty in walking, not elsewhere classified (R26.2);Other abnormalities of gait and mobility (R26.89);Unsteadiness on feet (R26.81)     Time: 2956-2130 PT Time Calculation (min) (ACUTE ONLY): 26 min  Charges:  $Gait Training: 8-22 mins $Therapeutic Exercise: 8-22 mins                     Raymond Gurney, PT, DPT Acute Rehabilitation Services  Office: [phone]    Jewel Baize 08/10/2022, 1:34 PM

## 2022-08-10 NOTE — Progress Notes (Signed)
PROGRESS NOTE   [name]  ZOX:[id number] DOB: Apr 20, 1927 DOA: 08/06/2022 PCP: Nona Dell, NP  Brief Narrative:   89 year old black female, predominantly wheelchair-bound since 09/2021 Distant CABG 2003 ask for Dr. 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 Linna Caprice- Underlying CKD 4 baseline creatinine 2.4 DM TY 2 Prior pression fracture L1 07/2021 Dr. Ophelia Charter follows Peripheral arterial disease?  Follows with Dr. Linden Dolin good flow on noninvasive testing as of 08/2020 (ABI  R1.4, L1.1)   She presented with sudden left hand weakness at around 2 AM on 6/19 with left upper extremity weakness noticed her hand was dragging had to use right hand-has previously been told she has right lower extremity weakness with sciatica-seen by neurology who recommended admission-note taken off aspirin about 2 months prior by PCP after having been on it  Continues to await skilled placement  Hospital-Problem based course  TIA MRI negative for stroke--CVA 2000 Aspirin Plavix 21 days and then discontinue Plavix July 10 Secondary prevention measures A1c 6.4 LDL is 91 EF 60-65% Statin Crestor 20   DM TY 2 well-controlled A1c 6.4 Resume Jardiance 10 Stop all blood glucose checks at this time  AKI Rising creatinine-force oral fluids and if no better bladder scan and start IVF I will cut back her Coreg a little bit to 6.25 twice daily as she is at risk for normotensive AKI secondary to her advanced age  HTN-has MRI negative for stroke can resume Coreg and amlodipine-would not use chlorthalidone given salt wasting that typically occurs with chlorthalidone At the time stop losartan in a 89 year old--would probably place on only as needed blood pressure meds in the outpatient setting  DVT prophylaxis: Lovenox Code Status: Full Family Communication: None present Disposition:  Status is:  Inpatient Remains inpatient appropriate because:   Requires placement to skilled facility   Subjective:  Looks well feels well Continues to have pain in both hands no fever no chills Ambulated 17 feet but appears to require rehab given her unsteadiness  Objective: Vitals:   08/09/22 2355 08/10/22 0756 08/10/22 1226 08/10/22 1614  BP: 117/63 (!) 118/43 (!) 110/48 (!) 123/50  Pulse: 60 (!) 51 (!) 48 (!) 56  Resp: 16 19 17 18   Temp: 97.9 F (36.6 C) 97.8 F (36.6 C) (!) 97.5 F (36.4 C) 97.7 F (36.5 C)  TempSrc: Oral Oral Oral Oral  SpO2: 98% 100% 100% 100%  Weight:      Height:        Intake/Output Summary (Last 24 hours) at 08/10/2022 1642 Last data filed at 08/10/2022 0500 Gross per 24 hour  Intake --  Output 325 ml  Net -325 ml    Filed Weights   08/06/22 1449  Weight: 59 kg    Examination:  Deficit on left side overall improved S1-S2 no murmur Chest is clear no wheeze rales rhonchi Abdomen soft No lower extremity edema   Data Reviewed: personally reviewed   CBC    Component Value Date/Time   WBC 5.2 08/07/2022 0039   RBC 3.41 (L) 08/07/2022 0039   HGB 11.0 (L) 08/07/2022 0039   HGB 10.8 (L) 11/14/2020 1524   HCT 33.5 (L) 08/07/2022 0039   HCT 32.8 (L) 11/14/2020 1524   PLT 210 08/07/2022 0039   PLT 206 11/14/2020 1524   MCV 98.2 08/07/2022 0039   MCV 97 11/14/2020 1524   MCH 32.3 08/07/2022 0039   MCHC 32.8 08/07/2022 0039  RDW 11.9 08/07/2022 0039   RDW 13.1 11/14/2020 1524   LYMPHSABS 1.1 08/06/2022 1500   MONOABS 0.5 08/06/2022 1500   EOSABS 0.1 08/06/2022 1500   BASOSABS 0.0 08/06/2022 1500      Latest Ref Rng & Units 08/10/2022    6:13 AM 08/07/2022   12:39 AM 08/06/2022    3:06 PM  CMP  Glucose 70 - 99 mg/dL 161  87  096   BUN 8 - 23 mg/dL 62  43  50   Creatinine 0.44 - 1.00 mg/dL 0.45  4.09  8.11   Sodium 135 - 145 mmol/L 135  141  143   Potassium 3.5 - 5.1 mmol/L 4.8  3.4  3.8   Chloride 98 - 111 mmol/L 98  110  106   CO2  22 - 32 mmol/L 28  23    Calcium 8.9 - 10.3 mg/dL 8.9  8.1    Total Protein 6.5 - 8.1 g/dL  5.3    Total Bilirubin 0.3 - 1.2 mg/dL  0.7    Alkaline Phos 38 - 126 U/L  51    AST 15 - 41 U/L  18    ALT 0 - 44 U/L  9       Radiology Studies: No results found.   Scheduled Meds:  aspirin EC  81 mg Oral Daily   carvedilol  12.5 mg Oral BID WC   clopidogrel  75 mg Oral Daily   empagliflozin  10 mg Oral Daily   enoxaparin (LOVENOX) injection  30 mg Subcutaneous Q24H   lidocaine  1 patch Transdermal Q2200   rosuvastatin  20 mg Oral Daily   traMADol  25 mg Oral Daily   traMADol  50 mg Oral QHS   Continuous Infusions:   LOS: 3 days   Time spent: 11  Rhetta Mura, MD [facility] To contact the attending provider between 7A-7P or the covering provider during after hours 7P-7A, please log into the web site [email or web address] and access using universal [place] password for that web site. If you do not have the password, please call the hospital operator.  08/10/2022, 4:42 PM

## 2022-08-10 NOTE — Plan of Care (Signed)
  Problem: Education: Goal: Knowledge of disease or condition will improve Outcome: Progressing Goal: Knowledge of secondary prevention will improve (MUST DOCUMENT ALL) Outcome: Progressing Goal: Knowledge of patient specific risk factors will improve (Mark N/A or DELETE if not current risk factor) Outcome: Progressing   Problem: Ischemic Stroke/TIA Tissue Perfusion: Goal: Complications of ischemic stroke/TIA will be minimized Outcome: Progressing   Problem: Coping: Goal: Will verbalize positive feelings about self Outcome: Progressing Goal: Will identify appropriate support needs Outcome: Progressing   Problem: Health Behavior/Discharge Planning: Goal: Ability to manage health-related needs will improve Outcome: Progressing Goal: Goals will be collaboratively established with patient/family Outcome: Progressing   Problem: Self-Care: Goal: Ability to participate in self-care as condition permits will improve Outcome: Progressing Goal: Verbalization of feelings and concerns over difficulty with self-care will improve Outcome: Progressing Goal: Ability to communicate needs accurately will improve Outcome: Progressing   Problem: Nutrition: Goal: Risk of aspiration will decrease Outcome: Progressing Goal: Dietary intake will improve Outcome: Progressing   

## 2022-08-11 DIAGNOSIS — I639 Cerebral infarction, unspecified: Secondary | ICD-10-CM | POA: Diagnosis not present

## 2022-08-11 LAB — COMPREHENSIVE METABOLIC PANEL
ALT: 30 U/L (ref 0–44)
AST: 27 U/L (ref 15–41)
Albumin: 2.7 g/dL — ABNORMAL LOW (ref 3.5–5.0)
Alkaline Phosphatase: 79 U/L (ref 38–126)
Anion gap: 13 (ref 5–15)
BUN: 67 mg/dL — ABNORMAL HIGH (ref 8–23)
CO2: 25 mmol/L (ref 22–32)
Calcium: 9.2 mg/dL (ref 8.9–10.3)
Chloride: 94 mmol/L — ABNORMAL LOW (ref 98–111)
Creatinine, Ser: 2.1 mg/dL — ABNORMAL HIGH (ref 0.44–1.00)
GFR, Estimated: 22 mL/min — ABNORMAL LOW (ref >60)
Glucose, Bld: 145 mg/dL — ABNORMAL HIGH (ref 70–99)
Potassium: 4.8 mmol/L (ref 3.5–5.1)
Sodium: 132 mmol/L — ABNORMAL LOW (ref 135–145)
Total Bilirubin: 0.4 mg/dL (ref 0.3–1.2)
Total Protein: 5.6 g/dL — ABNORMAL LOW (ref 6.5–8.1)

## 2022-08-11 LAB — GLUCOSE, CAPILLARY
Glucose-Capillary: 159 mg/dL — ABNORMAL HIGH (ref 70–99)
Glucose-Capillary: 142 mg/dL — ABNORMAL HIGH (ref 70–99)
Glucose-Capillary: 218 mg/dL — ABNORMAL HIGH (ref 70–99)
Glucose-Capillary: 159 mg/dL — ABNORMAL HIGH (ref 70–99)

## 2022-08-11 MED ORDER — GLIPIZIDE 5 MG PO TABS
2.5000 mg | ORAL_TABLET | Freq: Every day | ORAL | Status: DC
  Administered 2022-08-12 – 2022-08-13 (×2): 2.5 mg via ORAL
  Filled 2022-08-11: qty 1
  Filled 2022-08-11 (×3): qty 0.5

## 2022-08-11 MED ORDER — SODIUM CHLORIDE 0.9 % IV SOLN: INTRAVENOUS | Status: DC

## 2022-08-11 MED ORDER — TRAMADOL HCL 50 MG PO TABS
25.0000 mg | ORAL_TABLET | Freq: Every day | ORAL | Status: DC
  Administered 2022-08-11 – 2022-08-12 (×2): 25 mg via ORAL
  Filled 2022-08-11 (×2): qty 1

## 2022-08-11 NOTE — Care Management Important Message (Signed)
Important Message  Patient Details  Name: [name] MRN: [id number] Date of Birth: 03/03/1927   Medicare Important Message Given:  Yes     Tamara Kenyon 08/11/2022, 2:35 PM

## 2022-08-11 NOTE — Plan of Care (Signed)

## 2022-08-11 NOTE — Inpatient Diabetes Management (Signed)
Inpatient Diabetes Program Recommendations  AACE/ADA: New Consensus Statement on Inpatient Glycemic Control (2015)  Target Ranges:  Prepandial:   less than 140 mg/dL      Peak postprandial:   less than 180 mg/dL (1-2 hours)      Critically ill patients:  140 - 180 mg/dL   Lab Results  Component Value Date   GLUCAP 159 (H) 08/11/2022   HGBA1C 6.4 (H) 08/07/2022    Review of Glycemic Control  Latest Reference Range & Units 08/10/22 12:28 08/10/22 16:15 08/10/22 21:56 08/11/22 06:16  Glucose-Capillary 70 - 99 mg/dL 161 (H) 096 (H) 045 (H) 159 (H)  (H): Data is abnormally high Diabetes history: Type 2 DM Outpatient Diabetes medications: Jardiance 10 mg QD Current orders for Inpatient glycemic control: Jardiance 10 mg QD  Inpatient Diabetes Program Recommendations:    Could consider adding Novolog 0-6 units TID & HS.   Thanks, Lujean Rave, MSN, RNC-OB Diabetes Coordinator [phone] (8a-5p)

## 2022-08-11 NOTE — Progress Notes (Signed)
PROGRESS NOTE   [name]  ZOX:[id number] DOB: 04/27/1927 DOA: 08/06/2022 PCP: Nona Dell, NP  Brief Narrative:   89 year old black female, predominantly wheelchair-bound since 09/2021 Distant CABG 2003 ask for Dr. 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 Linna Caprice- Underlying CKD 4 baseline creatinine 2.4 DM TY 2 Prior pression fracture L1 07/2021 Dr. Ophelia Charter follows Peripheral arterial disease?  Follows with Dr. Linden Dolin good flow on noninvasive testing as of 08/2020 (ABI  R1.4, L1.1)   She presented with sudden left hand weakness at around 2 AM on 6/19 with left upper extremity weakness noticed her hand was dragging had to use right hand-has previously been told she has right lower extremity weakness with sciatica-seen by neurology who recommended admission-note taken off aspirin about 2 months prior by PCP after having been on it  Continues to await skilled placement  Hospital-Problem based course  TIA MRI negative for stroke--CVA 2000 Aspirin Plavix 21 days and then discontinue Plavix July 10 Secondary prevention measures A1c 6.4 LDL is 91 EF 60-65% Statin Crestor 20 OT reengaged to order splint for left upper extremity as she has deficit to extension of hand which is new-also engaged with regards to cognitive eval as she feels "slower" [this is not new today but new since her event]   DM TY 2 well-controlled A1c 6.4 Resume Jardiance 10--- will place on glipizide 2.5 with breakfast Trying to avoid hypoglycemia with insulin in a 89 year old  AKI--underlying CKD 4 follows with Dr. Romelle Starcher Pulcifer kidney Rising creatinine-started IVF 75 cc/HX 24 hours, repeat labs Coreg discontinued secondary to bradycardia on 6/24  HTN--no chlorthalidone 2/2 salt wasting stop losartan in a 89 year old--would probably place on only as needed blood pressure meds in the outpatient  setting Will give PRN clonidine at d/c for BP > 180  DVT prophylaxis: Lovenox Code Status: Full Family Communication: None present Disposition:  Status is: Inpatient Remains inpatient appropriate because:   Requires placement to skilled facility   Subjective:  Well Mildly "slower" per her No cp fever  Objective: Vitals:   08/11/22 0500 08/11/22 0801 08/11/22 1153 08/11/22 1535  BP:  (!) 139/49 (!) 132/47 (!) 139/52  Pulse:  (!) 51 (!) 50 (!) 58  Resp:  14 14 14   Temp:  97.8 F (36.6 C) 97.6 F (36.4 C) 98.1 F (36.7 C)  TempSrc:  Oral Oral Oral  SpO2:  100%  100%  Weight: 51.5 kg     Height:        Intake/Output Summary (Last 24 hours) at 08/11/2022 1737 Last data filed at 08/11/2022 1300 Gross per 24 hour  Intake 800 ml  Output 600 ml  Net 200 ml    Filed Weights   08/06/22 1449 08/11/22 0500  Weight: 59 kg 51.5 kg    Examination:  Deficit on left side overall improved--she however cannot extedn at her wrist S1-S2 HSM 4/6 Chest is clear no wheeze rales rhonchi Abdomen soft No lower extremity edema   Data Reviewed: personally reviewed   CBC    Component Value Date/Time   WBC 5.2 08/07/2022 0039   RBC 3.41 (L) 08/07/2022 0039   HGB 11.0 (L) 08/07/2022 0039   HGB 10.8 (L) 11/14/2020 1524   HCT 33.5 (L) 08/07/2022 0039   HCT 32.8 (L) 11/14/2020 1524   PLT 210 08/07/2022 0039   PLT 206 11/14/2020 1524   MCV 98.2 08/07/2022 0039   MCV 97  11/14/2020 1524   MCH 32.3 08/07/2022 0039   MCHC 32.8 08/07/2022 0039   RDW 11.9 08/07/2022 0039   RDW 13.1 11/14/2020 1524   LYMPHSABS 1.1 08/06/2022 1500   MONOABS 0.5 08/06/2022 1500   EOSABS 0.1 08/06/2022 1500   BASOSABS 0.0 08/06/2022 1500      Latest Ref Rng & Units 08/11/2022    9:09 AM 08/10/2022    6:13 AM 08/07/2022   12:39 AM  CMP  Glucose 70 - 99 mg/dL 578  469  87   BUN 8 - 23 mg/dL 67  62  43   Creatinine 0.44 - 1.00 mg/dL 6.29  5.28  4.13   Sodium 135 - 145 mmol/L 132  135  141    Potassium 3.5 - 5.1 mmol/L 4.8  4.8  3.4   Chloride 98 - 111 mmol/L 94  98  110   CO2 22 - 32 mmol/L 25  28  23    Calcium 8.9 - 10.3 mg/dL 9.2  8.9  8.1   Total Protein 6.5 - 8.1 g/dL 5.6   5.3   Total Bilirubin 0.3 - 1.2 mg/dL 0.4   0.7   Alkaline Phos 38 - 126 U/L 79   51   AST 15 - 41 U/L 27   18   ALT 0 - 44 U/L 30   9      Radiology Studies: No results found.   Scheduled Meds:  aspirin EC  81 mg Oral Daily   clopidogrel  75 mg Oral Daily   empagliflozin  10 mg Oral Daily   enoxaparin (LOVENOX) injection  30 mg Subcutaneous Q24H   [START ON 08/12/2022] glipiZIDE  2.5 mg Oral QAC breakfast   lidocaine  1 patch Transdermal Q2200   rosuvastatin  20 mg Oral Daily   traMADol  25 mg Oral QHS   Continuous Infusions:  sodium chloride 75 mL/hr at 08/11/22 1600     LOS: 4 days   Time spent:22  Rhetta Mura, MD [facility] To contact the attending provider between 7A-7P or the covering provider during after hours 7P-7A, please log into the web site [email or web address] and access using universal [place] password for that web site. If you do not have the password, please call the hospital operator.  08/11/2022, 5:37 PM

## 2022-08-11 NOTE — TOC Progression Note (Signed)
Transition of Care [facility]) - Progression Note    Patient Details  Name: [name] MRN: [id number] Date of Birth: Aug 26, 1927  Transition of Care [facility] CM/SW Contact  Baldemar Lenis, [place] Phone Number: 08/11/2022, 1:50 PM  Clinical Narrative:   CSW met with patient to provide bed offers for SNF. CSW asked about contacting the patient's sons to discuss, and patient said she wanted to look it over and discuss with them. Patient said she wanted to find one that had really good rehabilitation so that she could get back home. CSW to follow.    Expected Discharge Plan: Skilled Nursing Facility Barriers to Discharge: Continued Medical Work up, English as a second language teacher  Expected Discharge Plan and Services In-house Referral: Clinical Social Work   Post Acute Care Choice: Skilled Nursing Facility Living arrangements for the past 2 months: Single Family Home                                       Social Determinants of Health (SDOH) Interventions SDOH Screenings   Food Insecurity: No Food Insecurity (03/27/2022)  Housing: Low Risk  (04/18/2021)  Transportation Needs: No Transportation Needs (03/27/2022)  Alcohol Screen: Low Risk  (11/14/2020)  Depression (PHQ2-9): Low Risk  (03/27/2022)  Financial Resource Strain: Low Risk  (03/27/2022)  Physical Activity: Inactive (03/27/2022)  Social Connections: Moderately Integrated (11/14/2020)  Stress: No Stress Concern Present (03/27/2022)  Tobacco Use: Low Risk  (08/07/2022)    Readmission Risk Interventions     No data to display

## 2022-08-12 DIAGNOSIS — I639 Cerebral infarction, unspecified: Secondary | ICD-10-CM | POA: Diagnosis not present

## 2022-08-12 LAB — BASIC METABOLIC PANEL
Anion gap: 7 (ref 5–15)
Anion gap: 9 (ref 5–15)
BUN: 60 mg/dL — ABNORMAL HIGH (ref 8–23)
BUN: 62 mg/dL — ABNORMAL HIGH (ref 8–23)
CO2: 23 mmol/L (ref 22–32)
CO2: 25 mmol/L (ref 22–32)
Calcium: 8.4 mg/dL — ABNORMAL LOW (ref 8.9–10.3)
Calcium: 9 mg/dL (ref 8.9–10.3)
Chloride: 98 mmol/L (ref 98–111)
Chloride: 101 mmol/L (ref 98–111)
Creatinine, Ser: 2.18 mg/dL — ABNORMAL HIGH (ref 0.44–1.00)
Creatinine, Ser: 2.47 mg/dL — ABNORMAL HIGH (ref 0.44–1.00)
GFR, Estimated: 21 mL/min — ABNORMAL LOW (ref >60)
GFR, Estimated: 18 mL/min — ABNORMAL LOW (ref >60)
Glucose, Bld: 162 mg/dL — ABNORMAL HIGH (ref 70–99)
Glucose, Bld: 75 mg/dL (ref 70–99)
Potassium: 4.4 mmol/L (ref 3.5–5.1)
Potassium: 4.7 mmol/L (ref 3.5–5.1)
Sodium: 128 mmol/L — ABNORMAL LOW (ref 135–145)
Sodium: 135 mmol/L (ref 135–145)

## 2022-08-12 LAB — GLUCOSE, CAPILLARY
Glucose-Capillary: 98 mg/dL (ref 70–99)
Glucose-Capillary: 151 mg/dL — ABNORMAL HIGH (ref 70–99)
Glucose-Capillary: 94 mg/dL (ref 70–99)

## 2022-08-12 MED ORDER — FLUTICASONE PROPIONATE 50 MCG/ACT NA SUSP
1.0000 | Freq: Every day | NASAL | Status: DC
  Administered 2022-08-12 – 2022-08-13 (×2): 1 via NASAL
  Filled 2022-08-12: qty 16

## 2022-08-12 NOTE — Progress Notes (Signed)
Occupational Therapy Treatment Patient Details Name: [name] MRN: [id number] DOB: 02-Jul-1927 Today's Date: 08/12/2022   History of present illness 89 year old female presented to [facility] ED 08/06/2022 with L upper arm weakness. CT head no acute intracranial abnormality and severe chronic small vessel disease. MRI no acute intracranial abnormality. PMH: CVA in 2003, CAD s/p CABG, HLD, HTN, CKD stage IV, lumbar radiculopathy, DM2 non-insulin-dependent, and closed wedge compression fracture of L1 vertebra with routine healing   OT comments  Pt demonstrates good return demo of L UE HEP movements with mod cues to complete task. Pt demonstrates functional grasp and decreased extension of digits. Pt is able to achieve full extension of digits with increased time. Pt with wrist in neutral position with decreased extension due to tone present. REcommendation remain appropriate for skilled inpatient follow up therapy, <3 hours/day.    Recommendations for follow up therapy are one component of a multi-disciplinary discharge planning process, led by the attending physician.  Recommendations may be updated based on patient status, additional functional criteria and insurance authorization.    Assistance Recommended at Discharge Frequent or constant Supervision/Assistance  Patient can return home with the following  A lot of help with walking and/or transfers;A lot of help with bathing/dressing/bathroom;Assist for transportation;Help with stairs or ramp for entrance;Direct supervision/assist for financial management;Assistance with cooking/housework   Equipment Recommendations  Other (comment)    Recommendations for Other Services Rehab consult    Precautions / Restrictions Precautions Precautions: Fall Precaution Comments: LUE weakness more distal then proximal Restrictions Weight Bearing Restrictions: No       Mobility Bed Mobility               General bed mobility comments: in chair     Transfers                   General transfer comment: spoke with PT and pt regarding transfers and will consider bil platform RW trial to increase safety with L UE deficits     Balance                                           ADL either performed or assessed with clinical judgement   ADL                                         General ADL Comments: pt sitting reading bible in chair on arrival.    Extremity/Trunk Assessment Upper Extremity Assessment Upper Extremity Assessment: LUE deficits/detail LUE Deficits / Details: pt demonstrates neutral wrist positiong with decreased wrist extension. Pt with passive ROM for extension met with Tone. Pt with decreased digit extension with flexed risk so splinting the patient is an increased risk for skin break down and decrease digit function. pt with able to complete supination/ pronation of L UE. Pt is able to demonstrate elbow flexion and touch shoulder. OT will continue L UE exercises HEP. LUE Sensation: decreased light touch   Lower Extremity Assessment Lower Extremity Assessment: Defer to PT evaluation        Vision       Perception     Praxis      Cognition Arousal/Alertness: Awake/alert Behavior During Therapy: WFL for tasks assessed/performed Overall Cognitive Status: No family/caregiver present to determine baseline cognitive functioning  General Comments: pt pleasant but demonstrate executive functional cognitive concerns. pt is able to clearly convey concerns and needs at this time. pt does express some home transportation barriers for future outpatient therapy. pt likely will need home services after SNF.        Exercises Exercises: Other exercises Other Exercises Other Exercises: digit extension/flexion, shoulder flexion, elbow flexion extension supination / pronation, wrist extension flexion. reps x15 Other Exercises: focused  on positioning of L UE to maximize function and assessed for splinting needs. do not feel splint will benefit pt at this time. Communicated with MD regarding this finding    Shoulder Instructions       General Comments      Pertinent Vitals/ Pain       Pain Assessment Pain Assessment: No/denies pain  Home Living                                          Prior Functioning/Environment              Frequency  Min 2X/week        Progress Toward Goals  OT Goals(current goals can now be found in the care plan section)  Progress towards OT goals: Progressing toward goals  Acute Rehab OT Goals Patient Stated Goal: hopes to get more therapy for L hand OT Goal Formulation: With patient Time For Goal Achievement: 08/21/22 Potential to Achieve Goals: Good ADL Goals Pt Will Perform Eating: with adaptive utensils;with modified independence Pt Will Perform Grooming: with supervision;standing Pt Will Perform Lower Body Bathing: with min assist;sit to/from stand Pt Will Perform Lower Body Dressing: with mod assist;sit to/from stand Pt Will Transfer to Toilet: with min assist;ambulating;bedside commode Pt Will Perform Toileting - Clothing Manipulation and hygiene: with min assist;sit to/from stand Pt/caregiver will Perform Home Exercise Program: Increased strength;Increased ROM;Right Upper extremity;With written HEP provided;With Supervision Additional ADL Goal #1: Pt will complete supine to sit EOB with supervision in preparation for selfcare tasks. Additional ADL Goal #2: pt will complete pill box with 2 errors or less  Plan Discharge plan remains appropriate    Co-evaluation                 AM-PAC OT "6 Clicks" Daily Activity     Outcome Measure   Help from another person eating meals?: A Little Help from another person taking care of personal grooming?: A Little Help from another person toileting, which includes using toliet, bedpan, or urinal?: A  Lot Help from another person bathing (including washing, rinsing, drying)?: A Lot Help from another person to put on and taking off regular upper body clothing?: A Lot Help from another person to put on and taking off regular lower body clothing?: A Lot 6 Click Score: 14    End of Session    OT Visit Diagnosis: Unsteadiness on feet (R26.81);Muscle weakness (generalized) (M62.81)   Activity Tolerance Patient tolerated treatment well   Patient Left in chair;with call bell/phone within reach;with chair alarm set   Nurse Communication Mobility status        Time: 1047-1100 OT Time Calculation (min): 13 min  Charges: OT General Charges $OT Visit: 1 Visit OT Treatments $Therapeutic Exercise: 8-22 mins   Brynn, OTR/L  Acute Rehabilitation Services Office: [phone] .   Mateo Flow 08/12/2022, 11:41 AM

## 2022-08-12 NOTE — Progress Notes (Signed)
Occupational Therapy Treatment Patient Details Name: [name] MRN: [id number] DOB: 08-Apr-1927 Today's Date: 08/12/2022   History of present illness 89 year old female presented to [facility] ED 08/06/2022 with L upper arm weakness. CT head no acute intracranial abnormality and severe chronic small vessel disease. MRI no acute intracranial abnormality. PMH: CVA in 2003, CAD s/p CABG, HLD, HTN, CKD stage IV, lumbar radiculopathy, DM2 non-insulin-dependent, and closed wedge compression fracture of L1 vertebra with routine healing   OT comments  General Comments: Assessed using the Pill Box Test. Pt failed the assessment, demonstrating poor planning, mental flexibility, suboptimal search strategies, concrete thinking and the inability to multitask. Pt had a total of __errors, where more than 3 errors is considered a fail.  Errors: One tablet 3x/day (yellow) - _7_ errors (omission/misplacement) One tablet 2x/day with breakfast and dinner (green) - _6_ errors (omission/misplacement) One tablet in the morning (Blue)- _0_ errors (omission/misplacement) One tablet daily at bedtime (orange) - _0_ errors(omission/misplacement) One tablet every other day (red) - _2_ errors (omission/misplacement)   Number of misplaced movement errors (pills placed in incorrect compartment)- __ Number of total errors (sum of omissions; misplacements) - _15_ Total time to complete task (allowed 5 min) - _19_ min    Recommendations for follow up therapy are one component of a multi-disciplinary discharge planning process, led by the attending physician.  Recommendations may be updated based on patient status, additional functional criteria and insurance authorization.    Assistance Recommended at Discharge Frequent or constant Supervision/Assistance  Patient can return home with the following  A lot of help with walking and/or transfers;A lot of help with bathing/dressing/bathroom;Assist for transportation;Help with stairs  or ramp for entrance;Direct supervision/assist for financial management;Assistance with cooking/housework   Equipment Recommendations  Other (comment) (defer)    Recommendations for Other Services Rehab consult    Precautions / Restrictions Precautions Precautions: Fall Precaution Comments: LUE weakness more distal then proximal Restrictions Weight Bearing Restrictions: No       Mobility Bed Mobility Overal bed mobility: Needs Assistance             General bed mobility comments: in chair    Transfers                         Balance                                           ADL either performed or assessed with clinical judgement   ADL Overall ADL's : Needs assistance/impaired                                       General ADL Comments: focused on pillbox test    Extremity/Trunk Assessment Upper Extremity Assessment Upper Extremity Assessment: LUE deficits/detail LUE Deficits / Details: pt demonstrates neutral wrist positiong with decreased wrist extension. Pt with passive ROM for extension met with Tone. Pt with decreased digit extension with flexed risk so splinting the patient is an increased risk for skin break down and decrease digit function. pt with able to complete supination/ pronation of L UE. Pt is able to demonstrate elbow flexion and touch shoulder. OT will continue L UE exercises HEP. LUE Sensation: decreased light touch   Lower Extremity Assessment Lower Extremity Assessment: Defer to PT evaluation  Vision       Perception     Praxis      Cognition Arousal/Alertness: Awake/alert Behavior During Therapy: WFL for tasks assessed/performed Overall Cognitive Status: No family/caregiver present to determine baseline cognitive functioning                                 General Comments: Patient performed pillbox text with patient requiring 19 minutes to complete and multiple  errors. Patient appear to have difficulty with medication taken more than once a day.        Exercises      Shoulder Instructions       General Comments Patient seen to address pillbox test with patient failing test due to multiple errors and requiring 19 minutes to complete    Pertinent Vitals/ Pain       Pain Assessment Pain Assessment: No/denies pain  Home Living                                          Prior Functioning/Environment              Frequency  Min 2X/week        Progress Toward Goals  OT Goals(current goals can now be found in the care plan section)  Progress towards OT goals: Progressing toward goals  Acute Rehab OT Goals Patient Stated Goal: wants to go to [facility] for rehab OT Goal Formulation: With patient Time For Goal Achievement: 08/21/22 Potential to Achieve Goals: Good ADL Goals Pt Will Perform Eating: with adaptive utensils;with modified independence Pt Will Perform Grooming: with supervision;standing Pt Will Perform Lower Body Bathing: with min assist;sit to/from stand Pt Will Perform Lower Body Dressing: with mod assist;sit to/from stand Pt Will Transfer to Toilet: with min assist;ambulating;bedside commode Pt Will Perform Toileting - Clothing Manipulation and hygiene: with min assist;sit to/from stand Pt/caregiver will Perform Home Exercise Program: Increased strength;Increased ROM;Right Upper extremity;With written HEP provided;With Supervision Additional ADL Goal #1: Pt will complete supine to sit EOB with supervision in preparation for selfcare tasks. Additional ADL Goal #2: pt will complete pill box with 2 errors or less  Plan Discharge plan remains appropriate    Co-evaluation                 AM-PAC OT "6 Clicks" Daily Activity     Outcome Measure   Help from another person eating meals?: A Little Help from another person taking care of personal grooming?: A Little Help from another person  toileting, which includes using toliet, bedpan, or urinal?: A Lot Help from another person bathing (including washing, rinsing, drying)?: A Lot Help from another person to put on and taking off regular upper body clothing?: A Lot Help from another person to put on and taking off regular lower body clothing?: A Lot 6 Click Score: 14    End of Session    OT Visit Diagnosis: Unsteadiness on feet (R26.81);Muscle weakness (generalized) (M62.81)   Activity Tolerance Patient tolerated treatment well   Patient Left in chair;with call bell/phone within reach;with chair alarm set   Nurse Communication Mobility status        Time: 1610-9604 OT Time Calculation (min): 27 min  Charges: OT General Charges $OT Visit: 1 Visit OT Treatments $Therapeutic Activity: 23-37 mins $Therapeutic Exercise: 8-22 mins  Alfonse Flavors,  [facility] Acute Rehabilitation Services  Office [phone]   Dewain Penning 08/12/2022, 2:55 PM

## 2022-08-12 NOTE — Progress Notes (Signed)
Physical Therapy Treatment Patient Details Name: [name] MRN: [id number] DOB: 1927-11-15 Today's Date: 08/12/2022   History of Present Illness 89 year old female presented to [facility] ED 08/06/2022 with L upper arm weakness. CT head no acute intracranial abnormality and severe chronic small vessel disease. MRI no acute intracranial abnormality. PMH: CVA in 2003, CAD s/p CABG, HLD, HTN, CKD stage IV, lumbar radiculopathy, DM2 non-insulin-dependent, and closed wedge compression fracture of L1 vertebra with routine healing    PT Comments    Pt resting in bed at start and noted that bed is soiled with urine. Min guard with pt using bed features to move supine>sit EOB. Pt requesting bathroom on sitting EOB and BSC provided. Pt required Mod assist for sit>stand and to guide pivot bed>BSC. Pt had continent void of bladder and flatus. Pt required mod assist to stand and guide turn to move to recliner. Min assist for power up from recliner with cues for hand placement on bil armrest and assist to steady with hand transition to RW. PT amb ~18' with RW and Min assist with close chair follow. Pt reporting this distance is longer than typical ambulation distance at home. EOS pt reposition in recliner, Alarm on and call bell within reach. Will continue to progress as able.    Recommendations for follow up therapy are one component of a multi-disciplinary discharge planning process, led by the attending physician.  Recommendations may be updated based on patient status, additional functional criteria and insurance authorization.  Follow Up Recommendations  Can patient physically be transported by private vehicle: No    Assistance Recommended at Discharge Frequent or constant Supervision/Assistance  Patient can return home with the following A lot of help with walking and/or transfers;A lot of help with bathing/dressing/bathroom;Assistance with cooking/housework;Direct supervision/assist for medications  management;Assist for transportation;Help with stairs or ramp for entrance   Equipment Recommendations  Hospital bed    Recommendations for Other Services       Precautions / Restrictions Precautions Precautions: Fall Precaution Comments: LUE weakness more distal then proximal Restrictions Weight Bearing Restrictions: No     Mobility  Bed Mobility Overal bed mobility: Needs Assistance Bed Mobility: Supine to Sit     Supine to sit: Min guard, HOB elevated     General bed mobility comments: Several minutes and cues to scoot hips to sit up EOB using bed rail with HOB elevated to exit R side of bed, min guard for safety    Transfers Overall transfer level: Needs assistance Equipment used: Rolling walker (2 wheels) Transfers: Sit to/from Stand Sit to Stand: Min assist, Mod assist   Step pivot transfers: Mod assist       General transfer comment: Mod assist to power up from EOB on initial stand, Rt LE flexed at knee and hip, pt preferring toe touch but able to place Rt foot flat on floor with step progression for side step bed>BSC. Mod assist for rise from [facility], pt using back of legs against commode to power up and therapist stabilized commode for safety. pt required mod assist to guide pivot towards Lt with RW to move to recliner. Min assist for sit<>stand from recliner with bil UE use to power up.    Ambulation/Gait Ambulation/Gait assistance: Min assist Gait Distance (Feet): 18 Feet Assistive device: Rolling walker (2 wheels) Gait Pattern/deviations: Step-through pattern, Decreased step length - right, Decreased step length - left, Decreased stride length, Decreased dorsiflexion - right, Knee flexed in stance - right, Shuffle, Narrow base of support, Trunk flexed  Gait velocity: decr     General Gait Details: pt with flexed posture due to spinal and hip limitations,   Stairs             Wheelchair Mobility    Modified Rankin (Stroke Patients Only)        Balance Overall balance assessment: Needs assistance Sitting-balance support: Feet supported Sitting balance-[name]: Fair     Standing balance support: During functional activity, Reliant on assistive device for balance, Bilateral upper extremity supported Standing balance-[name]: Poor Standing balance comment: pt reliant on external support and RW for standing balance                            Cognition Arousal/Alertness: Awake/alert Behavior During Therapy: WFL for tasks assessed/performed Overall Cognitive Status: Within Functional Limits for tasks assessed                                 General Comments: pt pleasant, high interest in therapy and aware of deficits.        Exercises General Exercises - Upper Extremity Shoulder Flexion: AROM, Both, 10 reps, Seated, Strengthening (<90') General Exercises - Lower Extremity Long Arc Quad: AROM, Both, 10 reps, Seated, Strengthening Hip ABduction/ADduction: AROM, Strengthening, Both, 10 reps, Seated (pillow between knees, manual resistance from PT with abduction) Other Exercises Other Exercises: modified sit-ups with pt rising from back of recliner with it reclined back ~1/2 way, 10x Other Exercises: scapular retraction and cervical extension, 10x Other Exercises: PROM to R hamstring and gastroc 3x seated in chair    General Comments        Pertinent Vitals/Pain Pain Assessment Pain Assessment: Faces Faces Pain Scale: Hurts little more Pain Location: lower back and Rt hip Pain Descriptors / Indicators: Discomfort, Grimacing, Guarding Pain Intervention(s): Limited activity within patient's tolerance, Monitored during session, Repositioned    Home Living                          Prior Function            PT Goals (current goals can now be found in the care plan section) Acute Rehab PT Goals Patient Stated Goal: get stronger with walking and do rehab PT Goal Formulation:  With patient Time For Goal Achievement: 08/21/22 Potential to Achieve Goals: Fair Progress towards PT goals: Progressing toward goals    Frequency    Min 2X/week      PT Plan Equipment recommendations need to be updated    Co-evaluation              AM-PAC PT "6 Clicks" Mobility   Outcome Measure  Help needed turning from your back to your side while in a flat bed without using bedrails?: A Little Help needed moving from lying on your back to sitting on the side of a flat bed without using bedrails?: A Little Help needed moving to and from a bed to a chair (including a wheelchair)?: A Lot Help needed standing up from a chair using your arms (e.g., wheelchair or bedside chair)?: A Little Help needed to walk in hospital room?: Total (<20 ft) Help needed climbing 3-5 steps with a railing? : Total 6 Click Score: 13    End of Session Equipment Utilized During Treatment: Gait belt Activity Tolerance: Patient tolerated treatment well Patient left: in chair;with call bell/phone  within reach;with chair alarm set   PT Visit Diagnosis: Muscle weakness (generalized) (M62.81);Difficulty in walking, not elsewhere classified (R26.2);Other abnormalities of gait and mobility (R26.89);Unsteadiness on feet (R26.81)     Time: 1610-9604 PT Time Calculation (min) (ACUTE ONLY): 33 min  Charges:  $Gait Training: 8-22 mins $Therapeutic Activity: 8-22 mins                     Wynn Maudlin, DPT Acute Rehabilitation Services Office [phone]  08/12/22 12:42 PM

## 2022-08-12 NOTE — Progress Notes (Addendum)
PROGRESS NOTE   [name]  WUJ:[id number] DOB: [birth year] DOA: 08/06/2022 PCP: Nona Dell, NP  Brief Narrative:   89 year old black female, predominantly wheelchair-bound since 09/2021 Distant CABG 2003 ask for Dr. 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 Linna Caprice- Underlying CKD 4 baseline creatinine 2.4 DM TY 2 Prior pression fracture L1 07/2021 Dr. Ophelia Charter follows Peripheral arterial disease?  Follows with Dr. Linden Dolin good flow on noninvasive testing as of 08/2020 (ABI  R1.4, L1.1)   She presented with sudden left hand weakness at around 2 AM on 6/19 with left upper extremity weakness noticed her hand was dragging had to use right hand-has previously been told she has right lower extremity weakness with sciatica-seen by neurology who recommended admission-note taken off aspirin about 2 months prior by PCP after having been on it  Continues to await skilled placement  Hospital-Problem based course  TIA MRI negative for stroke--CVA 2000 Aspirin Plavix 21 days and then discontinue Plavix July 10 Secondary prevention measures A1c 6.4 LDL is 91 EF 60-65% Statin Crestor 20 OT reengaged-did not feel any type of splint for left upper extremity was recommended-they will continue to work on AROM exercises for left hand-cognitively she is improved   DM TY 2 well-controlled A1c 6.4 Resume Jardiance 10--- will place on glipizide 2.5 with breakfast-CBGs ranging 90s to 150s over 24 hours Avoid insulin to avoid hypoglycemia with insulin in a 89 year old  AKI--underlying CKD 4 follows with Dr. Romelle Starcher [place] [facility] [discussed with him in passing--Appt scheduled as per AVS] Creatinine peaked at 2.2 given IV fluids-discontinued now given slow trend downwards-recheck labs in a.m. Coreg discontinued secondary to bradycardia on 6/24  Mild hyponatremia Probably secondary to tea  toast/excessive oral intake of free water --serum osmolality about 285 Hold on checking urine electrolytes at this time Fluid restrict slightly to 1800 cc and advised to take solute with diet  HTN--no chlorthalidone 2/2 salt wasting stop losartan in a 89 year old--only PRN blood pressure meds given her age---Will give PRN clonidine at d/c for BP > 180  DVT prophylaxis: Lovenox Code Status: Full Family Communication: Left voicemail for his son Sylvan on telephone-explained to him to bring depends in from home-prefer that patient not use PureWick to promote mobility Disposition:  Status is: Inpatient Remains inpatient appropriate because:   Requires placement to skilled facility in the next several days-preference [facility]-have mentioned to her that she may need to pick an alternate if this is not available   Subjective:  She feels better She is drinking a lot of free water at bedside  She is somewhat weaker on her left arm but overall seems closer to her normal compared to yesterday She has no chest pain no fever chills no rigor  Objective: Vitals:   08/11/22 2338 08/12/22 0343 08/12/22 0825 08/12/22 1145  BP: (!) 136/57 (!) 149/57 (!) 159/58 (!) 157/75  Pulse: (!) 57 60 (!) 57 (!) 59  Resp: 18 17 19 17   Temp: 97.7 F (36.5 C) 97.6 F (36.4 C) 97.8 F (36.6 C) 97.9 F (36.6 C)  TempSrc: Oral Oral Oral Oral  SpO2: 100% 98% 100% 100%  Weight:      Height:        Intake/Output Summary (Last 24 hours) at 08/12/2022 1503 Last data filed at 08/12/2022 1146 Gross per 24 hour  Intake 41.48 ml  Output 1650 ml  Net -1608.52 ml    [employer]  08/06/22 1449 08/11/22 0500  Weight: 59 kg 51.5 kg    Examination:  Left-sided deficits are reasonable-remains with diminished extension and extensor aspect of the left forearm Chest is clear no rales rhonchi wheeze No arcus analysis Abdomen soft no rebound S1-S2 HSM noted No lower extremity edema  Data Reviewed: personally  reviewed   CBC    Component Value Date/Time   WBC 5.2 08/07/2022 0039   RBC 3.41 (L) 08/07/2022 0039   HGB 11.0 (L) 08/07/2022 0039   HGB 10.8 (L) 11/14/2020 1524   HCT 33.5 (L) 08/07/2022 0039   HCT 32.8 (L) 11/14/2020 1524   PLT 210 08/07/2022 0039   PLT 206 11/14/2020 1524   MCV 98.2 08/07/2022 0039   MCV 97 11/14/2020 1524   MCH 32.3 08/07/2022 0039   MCHC 32.8 08/07/2022 0039   RDW 11.9 08/07/2022 0039   RDW 13.1 11/14/2020 1524   LYMPHSABS 1.1 08/06/2022 1500   MONOABS 0.5 08/06/2022 1500   EOSABS 0.1 08/06/2022 1500   BASOSABS 0.0 08/06/2022 1500      Latest Ref Rng & Units 08/12/2022    9:46 AM 08/11/2022    9:09 AM 08/10/2022    6:13 AM  CMP  Glucose 70 - 99 mg/dL 098  119  147   BUN 8 - 23 mg/dL 60  67  62   Creatinine 0.44 - 1.00 mg/dL 8.29  5.62  1.30   Sodium 135 - 145 mmol/L 128  132  135   Potassium 3.5 - 5.1 mmol/L 4.4  4.8  4.8   Chloride 98 - 111 mmol/L 98  94  98   CO2 22 - 32 mmol/L 23  25  28    Calcium 8.9 - 10.3 mg/dL 8.4  9.2  8.9   Total Protein 6.5 - 8.1 g/dL  5.6    Total Bilirubin 0.3 - 1.2 mg/dL  0.4    Alkaline Phos 38 - 126 U/L  79    AST 15 - 41 U/L  27    ALT 0 - 44 U/L  30       Radiology Studies: No results found.   Scheduled Meds:  aspirin EC  81 mg Oral Daily   clopidogrel  75 mg Oral Daily   empagliflozin  10 mg Oral Daily   enoxaparin (LOVENOX) injection  30 mg Subcutaneous Q24H   fluticasone  1 spray Each Nare Daily   glipiZIDE  2.5 mg Oral QAC breakfast   lidocaine  1 patch Transdermal Q2200   rosuvastatin  20 mg Oral Daily   traMADol  25 mg Oral QHS   Continuous Infusions:  sodium chloride 75 mL/hr at 08/12/22 0527     LOS: 5 days   Time spent:15  Rhetta Mura, MD [facility] To contact the attending provider between 7A-7P or the covering provider during after hours 7P-7A, please log into the web site [email or web address] and access using universal [place] password for that web site. If you do not  have the password, please call the hospital operator.  08/12/2022, 3:03 PM

## 2022-08-12 NOTE — Hospital Course (Addendum)
TIA MRI negative for stroke--CVA 2000 Aspirin Plavix 21 days and then discontinue Plavix July 10 Secondary prevention measures A1c 6.4 LDL is 91 EF 60-65% Statin Crestor 20 OT reengaged to order splint for left upper extremity as she has deficit to extension of hand which is new-also engaged with regards to cognitive eval as she feels "slower" [this is not new today but new since her event]   DM TY 2 well-controlled A1c 6.4 Resume Jardiance 10--- will place on glipizide 2.5 with breakfast Trying to avoid hypoglycemia with insulin in a 89 year old  AKI--underlying CKD 4 follows with Dr. Romelle Starcher [place] [facility] Rising creatinine-started IVF 75 cc/HX 24 hours--stop IV fluids now, creatinine seems to have peaked and improved Coreg discontinued secondary to bradycardia on 6/24

## 2022-08-12 NOTE — TOC Progression Note (Signed)
Transition of Care [facility]) - Progression Note    Patient Details  Name: [name] MRN: [id number] Date of Birth: 12-27-1927  Transition of Care [facility]) CM/SW Contact  Baldemar Lenis, [place] Phone Number: 08/12/2022, 11:45 AM  Clinical Narrative:   CSW met with patient to discuss SNF placement. Patient had reviewed list yesterday, but had not yet made a decision. CSW discussed hospital preference list with patient, and she said she knew them all but wanted somewhere with good rehab like [facility]. Referral to [facility] is still pending, patient would like to go there. CSW contacted [facility] for them to review referral, they can offer a bed. CSW sent request to CMA to start insurance authorization. CSW to follow.    Expected Discharge Plan: Skilled Nursing Facility Barriers to Discharge: Continued Medical Work up, English as a second language teacher  Expected Discharge Plan and Services In-house Referral: Clinical Social Work   Post Acute Care Choice: Skilled Nursing Facility Living arrangements for the past 2 months: Single Family Home                                       Social Determinants of Health (SDOH) Interventions SDOH Screenings   Food Insecurity: No Food Insecurity (03/27/2022)  Housing: Low Risk  (04/18/2021)  Transportation Needs: No Transportation Needs (03/27/2022)  Alcohol Screen: Low Risk  (11/14/2020)  Depression (PHQ2-9): Low Risk  (03/27/2022)  Financial Resource Strain: Low Risk  (03/27/2022)  Physical Activity: Inactive (03/27/2022)  Social Connections: Moderately Integrated (11/14/2020)  Stress: No Stress Concern Present (03/27/2022)  Tobacco Use: Low Risk  (08/07/2022)    Readmission Risk Interventions     No data to display

## 2022-08-13 DIAGNOSIS — M069 Rheumatoid arthritis, unspecified: Secondary | ICD-10-CM | POA: Diagnosis not present

## 2022-08-13 DIAGNOSIS — E785 Hyperlipidemia, unspecified: Secondary | ICD-10-CM

## 2022-08-13 DIAGNOSIS — R5381 Other malaise: Secondary | ICD-10-CM | POA: Diagnosis not present

## 2022-08-13 DIAGNOSIS — E1151 Type 2 diabetes mellitus with diabetic peripheral angiopathy without gangrene: Secondary | ICD-10-CM | POA: Diagnosis not present

## 2022-08-13 DIAGNOSIS — R278 Other lack of coordination: Secondary | ICD-10-CM | POA: Diagnosis not present

## 2022-08-13 DIAGNOSIS — I129 Hypertensive chronic kidney disease with stage 1 through stage 4 chronic kidney disease, or unspecified chronic kidney disease: Secondary | ICD-10-CM | POA: Diagnosis not present

## 2022-08-13 DIAGNOSIS — R5383 Other fatigue: Secondary | ICD-10-CM | POA: Diagnosis not present

## 2022-08-13 DIAGNOSIS — N183 Chronic kidney disease, stage 3 unspecified: Secondary | ICD-10-CM | POA: Diagnosis not present

## 2022-08-13 DIAGNOSIS — R54 Age-related physical debility: Secondary | ICD-10-CM | POA: Diagnosis not present

## 2022-08-13 DIAGNOSIS — N184 Chronic kidney disease, stage 4 (severe): Secondary | ICD-10-CM | POA: Diagnosis not present

## 2022-08-13 DIAGNOSIS — M6281 Muscle weakness (generalized): Secondary | ICD-10-CM | POA: Diagnosis not present

## 2022-08-13 DIAGNOSIS — G459 Transient cerebral ischemic attack, unspecified: Secondary | ICD-10-CM | POA: Diagnosis not present

## 2022-08-13 DIAGNOSIS — R2689 Other abnormalities of gait and mobility: Secondary | ICD-10-CM | POA: Diagnosis not present

## 2022-08-13 DIAGNOSIS — I10 Essential (primary) hypertension: Secondary | ICD-10-CM | POA: Diagnosis not present

## 2022-08-13 DIAGNOSIS — I69354 Hemiplegia and hemiparesis following cerebral infarction affecting left non-dominant side: Secondary | ICD-10-CM | POA: Diagnosis not present

## 2022-08-13 DIAGNOSIS — I69391 Dysphagia following cerebral infarction: Secondary | ICD-10-CM | POA: Diagnosis not present

## 2022-08-13 DIAGNOSIS — E1165 Type 2 diabetes mellitus with hyperglycemia: Secondary | ICD-10-CM | POA: Diagnosis not present

## 2022-08-13 DIAGNOSIS — R2681 Unsteadiness on feet: Secondary | ICD-10-CM | POA: Diagnosis not present

## 2022-08-13 DIAGNOSIS — M1711 Unilateral primary osteoarthritis, right knee: Secondary | ICD-10-CM | POA: Diagnosis not present

## 2022-08-13 DIAGNOSIS — E1122 Type 2 diabetes mellitus with diabetic chronic kidney disease: Secondary | ICD-10-CM | POA: Diagnosis not present

## 2022-08-13 DIAGNOSIS — R1312 Dysphagia, oropharyngeal phase: Secondary | ICD-10-CM | POA: Diagnosis not present

## 2022-08-13 DIAGNOSIS — Z7401 Bed confinement status: Secondary | ICD-10-CM | POA: Diagnosis not present

## 2022-08-13 DIAGNOSIS — I639 Cerebral infarction, unspecified: Secondary | ICD-10-CM | POA: Diagnosis not present

## 2022-08-13 LAB — GLUCOSE, CAPILLARY
Glucose-Capillary: 70 mg/dL (ref 70–99)
Glucose-Capillary: 71 mg/dL (ref 70–99)

## 2022-08-13 LAB — CREATININE, SERUM
Creatinine, Ser: 2.18 mg/dL — ABNORMAL HIGH (ref 0.44–1.00)
GFR, Estimated: 21 mL/min — ABNORMAL LOW (ref >60)

## 2022-08-13 MED ORDER — TRAMADOL HCL 50 MG PO TABS: 25.0000 mg | ORAL_TABLET | ORAL | 0 refills | Status: DC

## 2022-08-13 MED ORDER — ACETAMINOPHEN 325 MG PO TABS
650.0000 mg | ORAL_TABLET | Freq: Four times a day (QID) | ORAL | Status: DC | PRN
  Administered 2022-08-13: 650 mg via ORAL
  Filled 2022-08-13: qty 2

## 2022-08-13 MED ORDER — PREDNISONE 5 MG PO TABS
5.0000 mg | ORAL_TABLET | Freq: Every day | ORAL | Status: DC
  Administered 2022-08-13: 5 mg via ORAL
  Filled 2022-08-13: qty 1

## 2022-08-13 MED ORDER — CLOPIDOGREL BISULFATE 75 MG PO TABS: 75.0000 mg | ORAL_TABLET | Freq: Every day | ORAL | 0 refills | Status: AC

## 2022-08-13 MED ORDER — POLYETHYLENE GLYCOL 3350 17 G PO PACK: 17.0000 g | PACK | Freq: Every day | ORAL | 0 refills | Status: DC | PRN

## 2022-08-13 MED ORDER — ASPIRIN 81 MG PO TBEC: 81.0000 mg | DELAYED_RELEASE_TABLET | Freq: Every day | ORAL | 12 refills | Status: DC

## 2022-08-13 MED ORDER — ROSUVASTATIN CALCIUM 20 MG PO TABS: 20.0000 mg | ORAL_TABLET | Freq: Every day | ORAL | Status: DC

## 2022-08-13 MED ORDER — OXYCODONE HCL 5 MG PO TABS: 2.5000 mg | ORAL_TABLET | ORAL | Status: DC | PRN

## 2022-08-13 MED ORDER — PREDNISONE 5 MG PO TABS: 5.0000 mg | ORAL_TABLET | Freq: Every day | ORAL | Status: DC

## 2022-08-13 MED ORDER — GABAPENTIN 300 MG PO CAPS
300.0000 mg | ORAL_CAPSULE | Freq: Once | ORAL | Status: AC
  Administered 2022-08-13: 300 mg via ORAL
  Filled 2022-08-13: qty 1

## 2022-08-13 MED ORDER — MUSCLE RUB 10-15 % EX CREA
1.0000 | TOPICAL_CREAM | CUTANEOUS | Status: DC | PRN
  Administered 2022-08-13: 1 via TOPICAL
  Filled 2022-08-13: qty 85

## 2022-08-13 NOTE — Progress Notes (Signed)
Mobility Specialist Progress Note   08/13/22 1538  Mobility  Activity Stood at bedside  Level of Assistance Minimal assist, patient does 75% or more  Assistive Device Front wheel walker  Activity Response Tolerated well  Mobility Referral Yes  $Mobility charge 1 Mobility  Mobility Specialist Start Time (ACUTE ONLY) 1510  Mobility Specialist Stop Time (ACUTE ONLY) 1538  Mobility Specialist Time Calculation (min) (ACUTE ONLY) 28 min   Assisted pt in getting dressed for d/c. Pt able to stand at chair x2 for pericare and placement of clothes w/ minA. Pt slow to rise on each stand but having no faults. Left in chair w/ all needs met, chair alarm and awaiting d/c.  Frederico Hamman Mobility Specialist Please contact via SecureChat or  Rehab office at [phone]

## 2022-08-13 NOTE — TOC Transition Note (Signed)
Transition of Care [facility]) - CM/SW Discharge Note   Patient Details  Name: [name] MRN: [id number] Date of Birth: [birth year]  [facility] of [facility]) CM/SW Contact:  Baldemar Lenis, LCSW Phone Number: 08/13/2022, 12:02 PM   Clinical Narrative:   CSW received insurance approval for patient to admit to [facility]. CSW met with patient to update, she is in agreement. CSW answered questions on transportation and insurance coverage. Patient asked CSW to contact son, Adele Schilder, and update him. CSW left a voicemail asking for call back. CSW sent discharge information to [facility], confirmed bed is ready. Transport arranged with [facility] for next available.  Nurse to call report to [phone], room 105.    Final next level of care: Skilled Nursing Facility Barriers to Discharge: Barriers Resolved   Patient Goals and CMS Choice CMS Medicare.gov Compare Post Acute Care list provided to:: Patient Choice offered to / list presented to : Patient  Discharge Placement                Patient chooses bed at: [facility] Patient to be transferred to facility by: [facility] Name of family member notified: Self, Adele Schilder (left a voicemail) Patient and family notified of of transfer: 08/13/22  Discharge Plan and Services Additional resources added to the After Visit Summary for   In-house Referral: Clinical Social Work   Post Acute Care Choice: Skilled Nursing Facility                               Social Determinants of Health (SDOH) Interventions SDOH Screenings   Food Insecurity: No Food Insecurity (03/27/2022)  Housing: Low Risk  (04/18/2021)  Transportation Needs: No Transportation Needs (03/27/2022)  Alcohol Screen: Low Risk  (11/14/2020)  Depression (PHQ2-9): Low Risk  (03/27/2022)  Financial Resource Strain: Low Risk  (03/27/2022)  Physical Activity: Inactive (03/27/2022)  Social Connections: Moderately Integrated (11/14/2020)  Stress: No Stress Concern Present  (03/27/2022)  Tobacco Use: Low Risk  (08/07/2022)     Readmission Risk Interventions     No data to display

## 2022-08-13 NOTE — Progress Notes (Signed)
Physical Therapy Treatment Patient Details Name: [name] MRN: [id number] DOB: [birth year] Today's Date: 08/13/2022   History of Present Illness 89 year old female presented to [facility] ED 08/06/2022 with L upper arm weakness. CT head no acute intracranial abnormality and severe chronic small vessel disease. MRI no acute intracranial abnormality. PMH: CVA in 2003, CAD s/p CABG, HLD, HTN, CKD stage IV, lumbar radiculopathy, DM2 non-insulin-dependent, and closed wedge compression fracture of L1 vertebra with routine healing    PT Comments    Pt was able to progress to ambulating an increased distance of up to ~20 ft today, but is still requiring bil forearm support on the RW and modA for balance, RW, management, and assisting L leg to step forward due to decreased R stance time secondary to pain. Pt needs reminders to widen BOS with poor success at this time. Completed session with seated exercises to improve strength of her core and lower extremities. Will continue to follow acutely.     Recommendations for follow up therapy are one component of a multi-disciplinary discharge planning process, led by the attending physician.  Recommendations may be updated based on patient status, additional functional criteria and insurance authorization.  Follow Up Recommendations  Can patient physically be transported by private vehicle: No    Assistance Recommended at Discharge Frequent or constant Supervision/Assistance  Patient can return home with the following A lot of help with walking and/or transfers;A lot of help with bathing/dressing/bathroom;Assistance with cooking/housework;Direct supervision/assist for medications management;Assist for transportation;Help with stairs or ramp for entrance   Equipment Recommendations  Hospital bed    Recommendations for Other Services       Precautions / Restrictions Precautions Precautions: Fall Precaution Comments: LUE weakness more distal then  proximal Restrictions Weight Bearing Restrictions: No     Mobility  Bed Mobility               General bed mobility comments: Pt sitting up in recliner upon arrival    Transfers Overall transfer level: Needs assistance Equipment used: Rolling walker (2 wheels) Transfers: Sit to/from Stand Sit to Stand: Min assist           General transfer comment: Pt needing minA to power up to stand and steady herself with RW, cuing to sequence scooting hips to edge of seat and for hand placement    Ambulation/Gait Ambulation/Gait assistance: Mod assist Gait Distance (Feet): 20 Feet Assistive device: Rolling walker (2 wheels) Gait Pattern/deviations: Decreased step length - left, Decreased stride length, Decreased dorsiflexion - right, Shuffle, Narrow base of support, Trunk flexed, Step-to pattern, Decreased stance time - right, Decreased dorsiflexion - left Gait velocity: decr Gait velocity interpretation: <1.31 ft/sec, indicative of household ambulator   General Gait Details: Pt with slow, step-to pattern with decreased R stance time and thus L step length due to pain. Improved R leg extension today though. Pt needs physical assist to advance L leg often and to widen BOS.   Stairs             Wheelchair Mobility    Modified Rankin (Stroke Patients Only)       Balance Overall balance assessment: Needs assistance Sitting-balance support: Feet supported Sitting balance-[name]: Fair     Standing balance support: During functional activity, Reliant on assistive device for balance, Bilateral upper extremity supported Standing balance-[name]: Poor Standing balance comment: pt reliant on external support and RW for standing balance  Cognition Arousal/Alertness: Awake/alert Behavior During Therapy: WFL for tasks assessed/performed Overall Cognitive Status: Within Functional Limits for tasks assessed                                  General Comments: pt pleasant, high interest in therapy and aware of deficits.        Exercises General Exercises - Lower Extremity Long Arc Quad: AROM, Both, 10 reps, Seated, Strengthening (against manual resistance from PT) Toe Raises: AROM, Strengthening, Both, 10 reps, Seated (against manual resistance from PT) Other Exercises Other Exercises: modified sit-ups with pt rising from back of recliner with it reclined back ~1/2 way, 10x Other Exercises: scapular retraction and cervical extension, 20x    General Comments        Pertinent Vitals/Pain Pain Assessment Pain Assessment: Faces Faces Pain Scale: Hurts little more Pain Location: lower back and legs Pain Descriptors / Indicators: Discomfort, Grimacing, Guarding Pain Intervention(s): Limited activity within patient's tolerance, Monitored during session, Repositioned    Home Living                          Prior Function            PT Goals (current goals can now be found in the care plan section) Acute Rehab PT Goals Patient Stated Goal: get stronger with walking and do rehab PT Goal Formulation: With patient Time For Goal Achievement: 08/21/22 Potential to Achieve Goals: Fair Progress towards PT goals: Progressing toward goals    Frequency    Min 2X/week      PT Plan Current plan remains appropriate    Co-evaluation              AM-PAC PT "6 Clicks" Mobility   Outcome Measure  Help needed turning from your back to your side while in a flat bed without using bedrails?: A Little Help needed moving from lying on your back to sitting on the side of a flat bed without using bedrails?: A Little Help needed moving to and from a bed to a chair (including a wheelchair)?: A Lot Help needed standing up from a chair using your arms (e.g., wheelchair or bedside chair)?: A Little Help needed to walk in hospital room?: A Lot Help needed climbing 3-5 steps with a railing? :  Total 6 Click Score: 14    End of Session Equipment Utilized During Treatment: Gait belt Activity Tolerance: Patient tolerated treatment well Patient left: in chair;with call bell/phone within reach;with chair alarm set   PT Visit Diagnosis: Muscle weakness (generalized) (M62.81);Difficulty in walking, not elsewhere classified (R26.2);Other abnormalities of gait and mobility (R26.89);Unsteadiness on feet (R26.81)     Time: 0981-1914 PT Time Calculation (min) (ACUTE ONLY): 18 min  Charges:  $Gait Training: 8-22 mins                     Raymond Gurney, PT, DPT Acute Rehabilitation Services  Office: [phone]    Jewel Baize 08/13/2022, 5:07 PM

## 2022-08-13 NOTE — Discharge Summary (Signed)
Physician Discharge Summary   Patient: [name] MRN: [id number] DOB: 06-05-1927  Admit date:     08/06/2022  Discharge date: 08/13/22  Discharge Physician: Alberteen Sam   PCP: Nona Dell, NP     Recommendations at discharge:  Follow up with PCP in 1 week after discharge from rehab for stroke PCP: Please ensure follow up for chronic but worsening R hip/leg pain Please check lipids in 3 months on new statin  Follow up with Orthopedics Dr. Ophelia Charter for low back pain and radiculopathy Follow up with Orthopedics Dr. Veda Canning for hip replacement  Follow up with Nephrology Dr. Thedore Mins for high blood pressure Follow up with [facility] for stroke     Discharge Diagnoses: Principal Problem:   CVA (cerebral vascular accident) [facility]) Active Problems:   Essential hypertension   CKD (chronic kidney disease), stage IV (HCC)   Hyperlipidemia   DM type 2 (diabetes mellitus, type 2) (HCC)   History of CAD (coronary artery disease) of artery bypass graft   Right hip and leg pain       Hospital Course: [name] is a 89 y.o. F with HTN, hx CVA, hx CAD s/p remote CABG, DM, and HLD who presented with LUE weakness.       * CVA (cerebral vascular accident) (HCC) MRI brain and cervical spine obtained, showed no acute findings.  She was evaluated by neurology, who felt the clinical syndrome was compelling and this was likely MRI negative stroke after stopping her aspirin.  She was started on DAPT for 3 weeks followed by aspirin alone indefinitely.  New Crestor started.    Carotid imaging unremarkable, PT recommended SNF.    Right hip and leg pain Separate from her stroke, the patient complained of severe right hip and leg pain.  From review of chart, this is a chronic and ongoing problem for over the last year.  There is likely a contribution from radiculopathy (for which she follows with Dr. Ophelia Charter) as well as severe hip osteoarthritis  (for which she follows with Dr. Linna Caprice and was expected to get a hip replacement this past winter).  For now recommend PT, analgesics, and close follow-up with her orthopedic surgeons for further workup           The [facility] Controlled Substances Registry was reviewed for this patient prior to discharge.   Consultants: Neurology Procedures performed:  MRI brain MRI cervical spine Echocardiogram Carotid US  Disposition: Skilled nursing facility Diet recommendation:  Cardiac and Carb modified diet  DISCHARGE MEDICATION: Allergies as of 08/13/2022   No Known Allergies      Medication List     STOP taking these medications    chlorthalidone 25 MG tablet Commonly known as: HYGROTON   irbesartan 150 MG tablet Commonly known as: Avapro       TAKE these medications    Accu-Chek Aviva Plus w/Device Kit Use to check  Blood sugars up to 3 times a day.  Dx code e11.65   Accu-Chek Aviva Soln Use with accu-check aviva machine   Accu-Chek Softclix Lancets lancets Use as instructed to check blood sugars up to 3 times a day  Dx code e11.65   acetaminophen 650 MG CR tablet Commonly known as: TYLENOL Take 650-1,300 mg by mouth every 8 (eight) hours as needed for pain.   amLODipine 5 MG tablet Commonly known as: NORVASC TAKE 1 TABLET(5 MG) BY MOUTH DAILY What changed: See the new instructions.   aspirin EC 81  MG tablet Take 1 tablet (81 mg total) by mouth daily. Swallow whole. Start taking on: August 14, 2022   carvedilol 12.5 MG tablet Commonly known as: COREG TAKE 1 TABLET(12.5 MG) BY MOUTH TWICE DAILY WITH A MEAL What changed: See the new instructions.   clindamycin 150 MG capsule Commonly known as: CLEOCIN For dentist appointments   clopidogrel 75 MG tablet Commonly known as: PLAVIX Take 1 tablet (75 mg total) by mouth daily for 21 days. Start taking on: August 14, 2022   fluticasone 50 MCG/ACT nasal spray Commonly known as: FLONASE Place 2  sprays into both nostrils daily as needed for rhinitis.   glucose blood test strip Use as instructed to check blood sugars up to 3 times a day.  Dx code e11.65   Jardiance 10 MG Tabs tablet Generic drug: empagliflozin Take 10 mg by mouth daily.   multivitamin capsule Take 1 capsule by mouth daily.   nitroGLYCERIN 0.4 MG SL tablet Commonly known as: NITROSTAT PLACE 1 TABLET UNDER THE TONGUE AT THE FIRST SIGN OF ATTACK, MAY REPEAT EVERY 5 MINUTES FOR 3 DOSES IN 15 MINUTES. IF PAIN PERSISTS CALL 911 What changed:  how much to take how to take this when to take this reasons to take this   polyethylene glycol 17 g packet Commonly known as: MIRALAX / GLYCOLAX Take 17 g by mouth daily as needed for mild constipation.   predniSONE 5 MG tablet Commonly known as: DELTASONE Take 5 mg by mouth daily.   rosuvastatin 20 MG tablet Commonly known as: CRESTOR Take 1 tablet (20 mg total) by mouth daily. Start taking on: August 14, 2022   traMADol 50 MG tablet Commonly known as: ULTRAM Take 0.5-1 tablets (25-50 mg total) by mouth See admin instructions. Take 1/2 tablet by mouth in the morning and 1 tablet in the evening   Vitamin D 50 MCG (2000 UT) Caps Take 2,000 Units by mouth daily.        Follow-up Information     Anthony Sar, MD. Go in 29 day(s).   Specialty: Nephrology Why: 0920 am at his office Contact information: [address] [place] [address] [phone]         Dorothyann Peng, MD Follow up.   Specialty: Internal Medicine Contact information: [address] [place] [address] [phone]         Nona Dell, NP .   Specialty: Nurse Practitioner Contact information: [address] [place] [address] [phone]         Samson Frederic, MD Follow up.   Specialty: Orthopedic Surgery Contact information: [address] [place] [address] [phone]         Eldred Manges,  MD .   Specialty: Orthopedic Surgery Contact information: [address] [place] [address] [phone]                 Discharge Instructions     Ambulatory referral to Neurology   Complete by: As directed    An appointment is requested in approximately: 8 weeks stroke   Discharge instructions   Complete by: As directed    **IMPORTANT DISCHARGE INSTRUCTIONS**  You were admitted for left arm weakness.  This appears to have been from a stroke You had an MRI of the brain and spine and this showed no new findings. You were evaluated by our neurologists, who suspect a small stroke, despite the MRI showing no acute findings.  To prevent  another stroke, you should resume low dose aspirin 81 mg from now on For the next three weeks, take clopidogrel/Plavix 75 mg once daily  Also, start a cholesterol medicine rosuvastatin/Crestor 20 mg before bed each night  Go see the Neurologists at [facility] in 6-8 weeks  CONTINUE your home blood pressure medicines, amlodipine and carvedilol  For now, hold the valsartan and chlorthalidone  Ask the rehab center to check your blood pressure and resume those if needed to keep blood pressure under 130/90   For the right hip pain: Go see Dr. Veda Canning It appears he was planning to do a hip replacement last December, please see him again to arrange this  You may also want to go see Dr. Marlene Bast office about your spine Call them for an appointment  In the meantime, use tramadol for pain and go see Mrs. Schmerge, your primary care doctor  For your blood pressure, go see Dr. Thedore Mins   Increase activity slowly   Complete by: As directed        Discharge Exam: Filed Weights   08/06/22 1449 08/11/22 0500 08/13/22 0500  Weight: 59 kg 51.5 kg 56 kg    General: Pt is alert, awake, not in acute distress, sitting in recliner Cardiovascular: RRR, nl S1-S2, no murmurs appreciated.   No LE edema.   Respiratory: Normal respiratory  rate and rhythm.  CTAB without rales or wheezes. Abdominal: Abdomen soft and non-tender.  No distension or HSM.   Neuro/Psych: Strength limited in right side by pain, right arm slightly weak, left arm slightly weaker.  Judgment and insight appear mildly impaired but oriented to situation, place and time.   Condition at discharge: stable  The results of significant diagnostics from this hospitalization (including imaging, microbiology, ancillary and laboratory) are listed below for reference.   Imaging Studies: VAS US CAROTID  Result Date: 08/11/2022 Carotid Arterial Duplex Study Patient Name:  [name]  Date of Exam:   08/07/2022 Medical Rec #: [id number]        Accession #:    [id number] Date of Birth: Oct 13, 1927        Patient Gender: F Patient Age:   28 years Exam Location:  [facility] Procedure:      VAS US CAROTID Referring Phys: Terrilee Files [facility] --------------------------------------------------------------------------------  Indications:       Weakness. Risk Factors:      Hypertension, hyperlipidemia, Diabetes, prior MI, coronary                    artery disease. Other Factors:     CKD !V. Limitations        Today's exam was limited due to Kyphosis. Comparison Study:  Prior carotid duplex indicating bilateral 1-39% ICA stenosis                    done 12/08/17 Performing Technologist: Sherren Kerns RVS  Examination Guidelines: A complete evaluation includes B-mode imaging, spectral Doppler, color Doppler, and power Doppler as needed of all accessible portions of each vessel. Bilateral testing is considered an integral part of a complete examination. Limited examinations for reoccurring indications may be performed as noted.  Right Carotid Findings: +----------+--------+--------+--------+------------------+------------------+           PSV cm/sEDV cm/sStenosisPlaque DescriptionComments           +----------+--------+--------+--------+------------------+------------------+  CCA Prox  65      9  intimal thickening +----------+--------+--------+--------+------------------+------------------+ CCA Distal50      12                                intimal thickening +----------+--------+--------+--------+------------------+------------------+ ICA Prox  53      8       1-39%   heterogenous                         +----------+--------+--------+--------+------------------+------------------+ ICA Mid   53      10                                                   +----------+--------+--------+--------+------------------+------------------+ ICA Distal44      12                                                   +----------+--------+--------+--------+------------------+------------------+ ECA       86      3                                                    +----------+--------+--------+--------+------------------+------------------+ +----------+--------+-------+--------+-------------------+           PSV cm/sEDV cmsDescribeArm Pressure (mmHG) +----------+--------+-------+--------+-------------------+ [id number]                                         +----------+--------+-------+--------+-------------------+ +---------+--------+--+--------+--+ VertebralPSV cm/s51EDV cm/s11 +---------+--------+--+--------+--+  Left Carotid Findings: +----------+--------+--------+--------+------------------+------------------+           PSV cm/sEDV cm/sStenosisPlaque DescriptionComments           +----------+--------+--------+--------+------------------+------------------+ CCA Prox  64      9                                 intimal thickening +----------+--------+--------+--------+------------------+------------------+ CCA Distal69      14                                intimal thickening +----------+--------+--------+--------+------------------+------------------+ ICA Prox  93      22      1-39%    heterogenous                         +----------+--------+--------+--------+------------------+------------------+ ICA Mid   94      17                                                   +----------+--------+--------+--------+------------------+------------------+ ICA Distal74      16                                                   +----------+--------+--------+--------+------------------+------------------+  ECA       74      4                                                    +----------+--------+--------+--------+------------------+------------------+ +----------+--------+--------+--------+-------------------+           PSV cm/sEDV cm/sDescribeArm Pressure (mmHG) +----------+--------+--------+--------+-------------------+ Subclavian102                                         +----------+--------+--------+--------+-------------------+ +---------+--------+--+--------+--+ VertebralPSV cm/s50EDV cm/s13 +---------+--------+--+--------+--+   Summary: Right Carotid: Velocities in the right ICA are consistent with a 1-39% stenosis. Left Carotid: Velocities in the left ICA are consistent with a 1-39% stenosis. Vertebrals:  Bilateral vertebral arteries demonstrate antegrade flow. Subclavians: Normal flow hemodynamics were seen in bilateral subclavian              arteries. *See table(s) above for measurements and observations.  Electronically signed by Delia Heady MD on 08/11/2022 at 5:07:05 PM.    Final    ECHOCARDIOGRAM COMPLETE  Result Date: 08/07/2022    ECHOCARDIOGRAM REPORT   [name] Date of Exam: 08/07/2022 Medical Rec #:  [id number]       Height:       64.0 in Accession #:    [id number]      Weight:       130.0 lb Date of Birth:  Jul 19, 1927       BSA:          1.629 m Patient Age:    89 years        BP:           156/70 mmHg Patient Gender: F               HR:           58 bpm. Exam Location:  Inpatient Procedure: 2D Echo, Cardiac Doppler and  Color Doppler Indications:    stroke  History:        Patient has prior history of Echocardiogram examinations, most                 recent 10/08/2001. Prior CABG, chronic kidney disease, PAD and                 Stroke; Risk Factors:Hypertension, Dyslipidemia and Diabetes.  Sonographer:    Delcie Roch RDCS Referring Phys: [id number] [facility] IMPRESSIONS  1. Left ventricular ejection fraction, by estimation, is 60 to 65%. The left ventricle has normal function. The left ventricle has no regional wall motion abnormalities. There is mild left ventricular hypertrophy. Left ventricular diastolic parameters are consistent with Grade I diastolic dysfunction (impaired relaxation).  2. Right ventricular systolic function is normal. The right ventricular size is normal. There is normal pulmonary artery systolic pressure.  3. The mitral valve is grossly normal. Mild mitral valve regurgitation.  4. The aortic valve is grossly normal. Aortic valve regurgitation is not visualized.  5. The inferior vena cava is normal in size with greater than 50% respiratory variability, suggesting right atrial pressure of 3 mmHg. FINDINGS  Left Ventricle: Left ventricular ejection fraction, by estimation, is 60 to 65%. The left ventricle has normal function. The left ventricle has no regional wall motion abnormalities.  The left ventricular internal cavity size was normal in size. There is  mild left ventricular hypertrophy. Left ventricular diastolic parameters are consistent with Grade I diastolic dysfunction (impaired relaxation). Right Ventricle: The right ventricular size is normal. Right ventricular systolic function is normal. There is normal pulmonary artery systolic pressure. The tricuspid regurgitant velocity is 2.81 m/s, and with an assumed right atrial pressure of 3 mmHg,  the estimated right ventricular systolic pressure is 34.6 mmHg. Left Atrium: Left atrial size was normal in size. Right Atrium: Right atrial size was  normal in size. Pericardium: There is no evidence of pericardial effusion. Mitral Valve: The mitral valve is grossly normal. Mild mitral valve regurgitation. Tricuspid Valve: The tricuspid valve is normal in structure. Tricuspid valve regurgitation is mild. Aortic Valve: The aortic valve is grossly normal. Aortic valve regurgitation is not visualized. Aortic valve mean gradient measures 4.0 mmHg. Aortic valve peak gradient measures 8.6 mmHg. Aortic valve area, by VTI measures 1.83 cm. Pulmonic Valve: Pulmonic valve regurgitation is not visualized. Aorta: The aortic root and ascending aorta are structurally normal, with no evidence of dilitation. Venous: The inferior vena cava is normal in size with greater than 50% respiratory variability, suggesting right atrial pressure of 3 mmHg. IAS/Shunts: The interatrial septum was not assessed.  LEFT VENTRICLE PLAX 2D LVIDd:         4.30 cm   Diastology LVIDs:         3.00 cm   LV e' medial:    3.15 cm/s LV PW:         1.05 cm   LV E/e' medial:  22.7 LV IVS:        1.20 cm   LV e' lateral:   6.20 cm/s LVOT diam:     1.80 cm   LV E/e' lateral: 11.5 LV SV:         57 LV SV Index:   35 LVOT Area:     2.54 cm  RIGHT VENTRICLE            IVC RV S prime:     8.49 cm/s  IVC diam: 1.40 cm TAPSE (M-mode): 2.3 cm LEFT ATRIUM             Index        RIGHT ATRIUM          Index LA diam:        2.70 cm 1.66 cm/m   RA Area:     9.05 cm LA Vol (A2C):   45.0 ml 27.62 ml/m  RA Volume:   16.80 ml 10.31 ml/m LA Vol (A4C):   35.7 ml 21.91 ml/m LA Biplane Vol: 45.6 ml 27.99 ml/m  AORTIC VALVE AV Area (Vmax):    1.61 cm AV Area (Vmean):   1.78 cm AV Area (VTI):     1.83 cm AV Vmax:           147.00 cm/s AV Vmean:          92.200 cm/s AV VTI:            0.311 m AV Peak Grad:      8.6 mmHg AV Mean Grad:      4.0 mmHg LVOT Vmax:         93.20 cm/s LVOT Vmean:        64.600 cm/s LVOT VTI:          0.224 m LVOT/AV VTI ratio: 0.72  AORTA Ao Root diam: 2.70 cm Ao Asc diam:  2.10 cm MITRAL  VALVE                TRICUSPID VALVE MV Area (PHT): 2.29 cm     TR Peak grad:   31.6 mmHg MV Decel Time: 331 msec     TR Vmax:        281.00 cm/s MR Peak grad: 141.6 mmHg MR Mean grad: 91.0 mmHg     SHUNTS MR Vmax:      595.00 cm/s   Systemic VTI:  0.22 m MR Vmean:     454.0 cm/s    Systemic Diam: 1.80 cm MV E velocity: 71.60 cm/s MV A velocity: 126.00 cm/s MV E/A ratio:  0.57 Photographer signed by Carolan Clines Signature Date/Time: 08/07/2022/12:25:17 PM    Final    MR ANGIO HEAD WO CONTRAST  Result Date: 08/07/2022 CLINICAL DATA:  Acute neurologic deficit EXAM: MRA HEAD WITHOUT CONTRAST TECHNIQUE: Angiographic images of the Circle of Willis were acquired using MRA technique without intravenous contrast. COMPARISON:  None Available. FINDINGS: POSTERIOR CIRCULATION: --Vertebral arteries: Normal --Inferior cerebellar arteries: Normal. --Basilar artery: Normal. --Superior cerebellar arteries: Normal. --Posterior cerebral arteries: Normal. ANTERIOR CIRCULATION: --Intracranial internal carotid arteries: Symmetric signal loss of the petrous segments of the internal carotid arteries, suggesting artifact. The ICAs are otherwise normal. Both posterior communicating arteries are patent. --Anterior cerebral arteries (ACA): Normal. --Middle cerebral arteries (MCA): Normal. IMPRESSION: Normal intracranial MRA. Electronically Signed   By: Deatra Robinson M.D.   On: 08/07/2022 01:16   MR CERVICAL SPINE WO CONTRAST  Result Date: 08/07/2022 CLINICAL DATA:  Cervical myelopathy EXAM: MRI CERVICAL SPINE WITHOUT CONTRAST TECHNIQUE: Multiplanar, multisequence MR imaging of the cervical spine was performed. No intravenous contrast was administered. COMPARISON:  None Available. FINDINGS: Alignment: Grade 1 anterolisthesis at C4-5 and C7-T1. Vertebrae: Modic type 1 signal changes at the opposing endplates of C6-7. No acute abnormality. Cord: Normal signal and morphology. Posterior Fossa, vertebral arteries, paraspinal  tissues: Negative. Disc levels: C1-2: Unremarkable. C2-3: Small disc bulge and mild facet hypertrophy with ligamentum flavum infolding. There is no spinal canal stenosis. No neural foraminal stenosis. C3-4: Intermediate sized disc osteophyte complex. Mild spinal canal stenosis. Moderate right neural foraminal stenosis. C4-5: Small disc bulge with bilateral uncovertebral hypertrophy and mild facet arthrosis. There is no spinal canal stenosis. Moderate bilateral neural foraminal stenosis. C5-6: Severe disc space loss with moderate left facet hypertrophy. There is no spinal canal stenosis. Mild right and moderate left neural foraminal stenosis. C6-7: Small disc bulge with bilateral uncovertebral hypertrophy. There is no spinal canal stenosis. Severe bilateral neural foraminal stenosis. C7-T1: Small disc bulge. There is no spinal canal stenosis. Moderate right and mild left neural foraminal stenosis. IMPRESSION: 1. Multilevel moderate-to-severe neural foraminal stenosis, worst at C6-7. 2. Mild spinal canal stenosis at C3-4. Electronically Signed   By: Deatra Robinson M.D.   On: 08/07/2022 00:53   MR BRAIN WO CONTRAST  Result Date: 08/06/2022 CLINICAL DATA:  Acute neurologic deficit EXAM: MRI HEAD WITHOUT CONTRAST TECHNIQUE: Multiplanar, multiecho pulse sequences of the brain and surrounding structures were obtained without intravenous contrast. COMPARISON:  None Available. FINDINGS: Brain: No acute infarct, mass effect or extra-axial collection. No acute or chronic hemorrhage. There is confluent hyperintense T2-weighted signal within the white matter. Parenchymal volume and CSF spaces are normal. The midline structures are normal. Vascular: Major flow voids are preserved. Skull and upper cervical spine: Normal calvarium and skull base. Visualized upper cervical spine and soft tissues are normal. Sinuses/Orbits:No paranasal sinus fluid levels  or advanced mucosal thickening. No mastoid or middle ear effusion. Normal  orbits. IMPRESSION: 1. No acute intracranial abnormality. 2. Advanced chronic microangiopathic white matter changes. Electronically Signed   By: Deatra Robinson M.D.   On: 08/06/2022 21:21   CT HEAD WO CONTRAST  Result Date: 08/06/2022 CLINICAL DATA:  Transient ischemic attack. EXAM: CT HEAD WITHOUT CONTRAST TECHNIQUE: Contiguous axial images were obtained from the base of the skull through the vertex without intravenous contrast. RADIATION DOSE REDUCTION: This exam was performed according to the departmental dose-optimization program which includes automated exposure control, adjustment of the mA and/or kV according to patient size and/or use of iterative reconstruction technique. COMPARISON:  None Available. FINDINGS: Brain: No acute hemorrhage. Patchy and confluent hypoattenuation of the cerebral white matter, most consistent with severe chronic small-vessel disease. Cortical gray-white differentiation is preserved. No hydrocephalus or extra-axial collection. No mass effect or midline shift. Vascular: No hyperdense vessel or unexpected calcification. Skull: No calvarial fracture or suspicious bone lesion. Skull base is unremarkable. Sinuses/Orbits: Unremarkable. Other: None. IMPRESSION: 1. No acute intracranial abnormality. 2. Severe chronic small-vessel disease. Electronically Signed   By: Orvan Falconer M.D.   On: 08/06/2022 15:50    Microbiology: Results for orders placed or performed during the hospital encounter of 04/05/21  Resp Panel by RT-PCR (Flu A&B, Covid) Nasopharyngeal Swab     Status: None   Collection Time: 04/05/21 10:42 AM   Specimen: Nasopharyngeal Swab; Nasopharyngeal(NP) swabs in vial transport medium  Result Value Ref Range Status   SARS Coronavirus 2 by RT PCR NEGATIVE NEGATIVE Final    Comment: (NOTE) SARS-CoV-2 target nucleic acids are NOT DETECTED.  The SARS-CoV-2 RNA is generally detectable in upper respiratory specimens during the acute phase of infection. The  lowest concentration of SARS-CoV-2 viral copies this assay can detect is 138 copies/mL. A negative result does not preclude SARS-Cov-2 infection and should not be used as the sole basis for treatment or other patient management decisions. A negative result may occur with  improper specimen collection/handling, submission of specimen other than nasopharyngeal swab, presence of viral mutation(s) within the areas targeted by this assay, and inadequate number of viral copies(<138 copies/mL). A negative result must be combined with clinical observations, patient history, and epidemiological information. The expected result is Negative.  Fact Sheet for Patients:  BloggerCourse.com  Fact Sheet for Healthcare Providers:  SeriousBroker.it  This test is no t yet approved or cleared by the [place] FDA and  has been authorized for detection and/or diagnosis of SARS-CoV-2 by FDA under an Emergency Use Authorization (EUA). This EUA will remain  in effect (meaning this test can be used) for the duration of the COVID-19 declaration under Section 564(b)(1) of the Act, 21 U.S.C.section 360bbb-3(b)(1), unless the authorization is terminated  or revoked sooner.       Influenza A by PCR NEGATIVE NEGATIVE Final   Influenza B by PCR NEGATIVE NEGATIVE Final    Comment: (NOTE) The Xpert Xpress SARS-CoV-2/FLU/RSV plus assay is intended as an aid in the diagnosis of influenza from Nasopharyngeal swab specimens and should not be used as a sole basis for treatment. Nasal washings and aspirates are unacceptable for Xpert Xpress SARS-CoV-2/FLU/RSV testing.  Fact Sheet for Patients: BloggerCourse.com  Fact Sheet for Healthcare Providers: SeriousBroker.it  This test is not yet approved or cleared by the [place] FDA and has been authorized for detection and/or diagnosis of SARS-CoV-2 by FDA under  an Emergency Use Authorization (EUA). This EUA will remain in effect (meaning  this test can be used) for the duration of the COVID-19 declaration under Section 564(b)(1) of the Act, 21 U.S.C. section 360bbb-3(b)(1), unless the authorization is terminated or revoked.  Performed at [facility], [address], [place], [place] [address]     Labs: CBC: Recent Labs  Lab 08/06/22 1500 08/06/22 1506 08/07/22 0039  WBC 5.6  --  5.2  NEUTROABS 3.8  --   --   HGB 13.3 14.3 11.0*  HCT 40.9 42.0 33.5*  MCV 99.8  --  98.2  PLT 229  --  210   Basic Metabolic Panel: Recent Labs  Lab 08/06/22 1638 08/07/22 0039 08/10/22 0613 08/11/22 0909 08/12/22 0946 08/12/22 1803 08/13/22 0738  NA  --  141 135 132* 128* 135  --   K  --  3.4* 4.8 4.8 4.4 4.7  --   CL  --  110 98 94* 98 101  --   CO2  --  23 28 25 23 25   --   GLUCOSE  --  87 122* 145* 162* 75  --   BUN  --  43* 62* 67* 60* 62*  --   CREATININE  --  1.71* 2.23* 2.10* 2.18* 2.47* 2.18*  CALCIUM  --  8.1* 8.9 9.2 8.4* 9.0  --   MG 2.1  --   --   --   --   --   --    Liver Function Tests: Recent Labs  Lab 08/06/22 1500 08/07/22 0039 08/11/22 0909  AST 20 18 27   ALT 13 9 30   ALKPHOS 72 51 79  BILITOT 0.4 0.7 0.4  PROT 7.2 5.3* 5.6*  ALBUMIN 4.0 2.8* 2.7*   CBG: Recent Labs  Lab 08/12/22 0624 08/12/22 1201 08/12/22 1647 08/12/22 2126 08/13/22 0624  GLUCAP 98 151* 94 70 71    Discharge time spent: approximately 35 minutes spent on discharge counseling, evaluation of patient on day of discharge, and coordination of discharge planning with nursing, social work, pharmacy and case management  Signed: Alberteen Sam, MD [facility] 08/13/2022

## 2022-08-14 DIAGNOSIS — I69391 Dysphagia following cerebral infarction: Secondary | ICD-10-CM | POA: Diagnosis not present

## 2022-08-14 DIAGNOSIS — R2681 Unsteadiness on feet: Secondary | ICD-10-CM | POA: Diagnosis not present

## 2022-08-14 DIAGNOSIS — N184 Chronic kidney disease, stage 4 (severe): Secondary | ICD-10-CM | POA: Diagnosis not present

## 2022-08-14 DIAGNOSIS — I69354 Hemiplegia and hemiparesis following cerebral infarction affecting left non-dominant side: Secondary | ICD-10-CM | POA: Diagnosis not present

## 2022-08-14 DIAGNOSIS — R278 Other lack of coordination: Secondary | ICD-10-CM | POA: Diagnosis not present

## 2022-08-14 DIAGNOSIS — M6281 Muscle weakness (generalized): Secondary | ICD-10-CM | POA: Diagnosis not present

## 2022-08-14 DIAGNOSIS — I10 Essential (primary) hypertension: Secondary | ICD-10-CM | POA: Diagnosis not present

## 2022-08-16 DIAGNOSIS — E1151 Type 2 diabetes mellitus with diabetic peripheral angiopathy without gangrene: Secondary | ICD-10-CM | POA: Diagnosis not present

## 2022-08-16 DIAGNOSIS — M069 Rheumatoid arthritis, unspecified: Secondary | ICD-10-CM | POA: Diagnosis not present

## 2022-08-16 DIAGNOSIS — N184 Chronic kidney disease, stage 4 (severe): Secondary | ICD-10-CM | POA: Diagnosis not present

## 2022-08-16 DIAGNOSIS — I129 Hypertensive chronic kidney disease with stage 1 through stage 4 chronic kidney disease, or unspecified chronic kidney disease: Secondary | ICD-10-CM | POA: Diagnosis not present

## 2022-08-16 DIAGNOSIS — E1122 Type 2 diabetes mellitus with diabetic chronic kidney disease: Secondary | ICD-10-CM | POA: Diagnosis not present

## 2022-08-16 DIAGNOSIS — R54 Age-related physical debility: Secondary | ICD-10-CM | POA: Diagnosis not present

## 2022-08-16 DIAGNOSIS — E1165 Type 2 diabetes mellitus with hyperglycemia: Secondary | ICD-10-CM | POA: Diagnosis not present

## 2022-08-16 DIAGNOSIS — M1711 Unilateral primary osteoarthritis, right knee: Secondary | ICD-10-CM | POA: Diagnosis not present

## 2022-08-17 DIAGNOSIS — I69354 Hemiplegia and hemiparesis following cerebral infarction affecting left non-dominant side: Secondary | ICD-10-CM | POA: Diagnosis not present

## 2022-08-17 DIAGNOSIS — N184 Chronic kidney disease, stage 4 (severe): Secondary | ICD-10-CM | POA: Diagnosis not present

## 2022-08-17 DIAGNOSIS — I10 Essential (primary) hypertension: Secondary | ICD-10-CM | POA: Diagnosis not present

## 2022-08-17 DIAGNOSIS — M6281 Muscle weakness (generalized): Secondary | ICD-10-CM | POA: Diagnosis not present

## 2023-01-22 ENCOUNTER — Other Ambulatory Visit: Payer: Self-pay | Admitting: Internal Medicine

## 2023-01-22 DIAGNOSIS — I1310 Hypertensive heart and chronic kidney disease without heart failure, with stage 1 through stage 4 chronic kidney disease, or unspecified chronic kidney disease: Secondary | ICD-10-CM

## 2023-02-12 ENCOUNTER — Emergency Department (HOSPITAL_COMMUNITY): Payer: Medicare PPO

## 2023-02-12 ENCOUNTER — Observation Stay (HOSPITAL_COMMUNITY)
Admission: EM | Admit: 2023-02-12 | Discharge: 2023-02-16 | Disposition: A | Discharge status: Skilled Nursing Facility | Payer: Medicare PPO | Attending: Internal Medicine | Admitting: Internal Medicine

## 2023-02-12 ENCOUNTER — Other Ambulatory Visit: Payer: Self-pay

## 2023-02-12 ENCOUNTER — Encounter (HOSPITAL_COMMUNITY): Payer: Self-pay

## 2023-02-12 DIAGNOSIS — Z79899 Other long term (current) drug therapy: Secondary | ICD-10-CM | POA: Diagnosis not present

## 2023-02-12 DIAGNOSIS — R41841 Cognitive communication deficit: Secondary | ICD-10-CM | POA: Insufficient documentation

## 2023-02-12 DIAGNOSIS — N184 Chronic kidney disease, stage 4 (severe): Secondary | ICD-10-CM | POA: Diagnosis present

## 2023-02-12 DIAGNOSIS — E785 Hyperlipidemia, unspecified: Secondary | ICD-10-CM | POA: Diagnosis present

## 2023-02-12 DIAGNOSIS — Z951 Presence of aortocoronary bypass graft: Secondary | ICD-10-CM | POA: Insufficient documentation

## 2023-02-12 DIAGNOSIS — E1169 Type 2 diabetes mellitus with other specified complication: Secondary | ICD-10-CM

## 2023-02-12 DIAGNOSIS — N183 Chronic kidney disease, stage 3 unspecified: Secondary | ICD-10-CM

## 2023-02-12 DIAGNOSIS — M25551 Pain in right hip: Secondary | ICD-10-CM | POA: Insufficient documentation

## 2023-02-12 DIAGNOSIS — I2510 Atherosclerotic heart disease of native coronary artery without angina pectoris: Secondary | ICD-10-CM | POA: Diagnosis not present

## 2023-02-12 DIAGNOSIS — E119 Type 2 diabetes mellitus without complications: Secondary | ICD-10-CM

## 2023-02-12 DIAGNOSIS — E1122 Type 2 diabetes mellitus with diabetic chronic kidney disease: Secondary | ICD-10-CM | POA: Diagnosis not present

## 2023-02-12 DIAGNOSIS — I10 Essential (primary) hypertension: Secondary | ICD-10-CM | POA: Diagnosis present

## 2023-02-12 DIAGNOSIS — Z7982 Long term (current) use of aspirin: Secondary | ICD-10-CM | POA: Diagnosis not present

## 2023-02-12 DIAGNOSIS — I129 Hypertensive chronic kidney disease with stage 1 through stage 4 chronic kidney disease, or unspecified chronic kidney disease: Secondary | ICD-10-CM | POA: Diagnosis not present

## 2023-02-12 DIAGNOSIS — I639 Cerebral infarction, unspecified: Secondary | ICD-10-CM | POA: Diagnosis not present

## 2023-02-12 DIAGNOSIS — R200 Anesthesia of skin: Secondary | ICD-10-CM | POA: Diagnosis present

## 2023-02-12 DIAGNOSIS — D638 Anemia in other chronic diseases classified elsewhere: Secondary | ICD-10-CM | POA: Diagnosis present

## 2023-02-12 DIAGNOSIS — Z8673 Personal history of transient ischemic attack (TIA), and cerebral infarction without residual deficits: Secondary | ICD-10-CM | POA: Diagnosis present

## 2023-02-12 DIAGNOSIS — Z7984 Long term (current) use of oral hypoglycemic drugs: Secondary | ICD-10-CM | POA: Diagnosis not present

## 2023-02-12 DIAGNOSIS — M1611 Unilateral primary osteoarthritis, right hip: Secondary | ICD-10-CM | POA: Diagnosis present

## 2023-02-12 DIAGNOSIS — R001 Bradycardia, unspecified: Secondary | ICD-10-CM | POA: Diagnosis not present

## 2023-02-12 DIAGNOSIS — D631 Anemia in chronic kidney disease: Secondary | ICD-10-CM | POA: Insufficient documentation

## 2023-02-12 DIAGNOSIS — I63331 Cerebral infarction due to thrombosis of right posterior cerebral artery: Secondary | ICD-10-CM | POA: Diagnosis not present

## 2023-02-12 LAB — RAPID URINE DRUG SCREEN, HOSP PERFORMED
Amphetamines: NOT DETECTED
Barbiturates: NOT DETECTED
Benzodiazepines: NOT DETECTED
Cocaine: NOT DETECTED
Opiates: NOT DETECTED
Tetrahydrocannabinol: NOT DETECTED

## 2023-02-12 LAB — LIPID PANEL
Cholesterol: 149 mg/dL (ref 0–200)
HDL: 33 mg/dL — ABNORMAL LOW (ref >40)
LDL Cholesterol: 95 mg/dL (ref 0–99)
Total CHOL/HDL Ratio: 4.5 {ratio}
Triglycerides: 103 mg/dL (ref <150)
VLDL: 21 mg/dL (ref 0–40)

## 2023-02-12 LAB — COMPREHENSIVE METABOLIC PANEL
ALT: 15 U/L (ref 0–44)
AST: 28 U/L (ref 15–41)
Albumin: 3.3 g/dL — ABNORMAL LOW (ref 3.5–5.0)
Alkaline Phosphatase: 77 U/L (ref 38–126)
Anion gap: 12 (ref 5–15)
BUN: 57 mg/dL — ABNORMAL HIGH (ref 8–23)
CO2: 23 mmol/L (ref 22–32)
Calcium: 9 mg/dL (ref 8.9–10.3)
Chloride: 104 mmol/L (ref 98–111)
Creatinine, Ser: 2.16 mg/dL — ABNORMAL HIGH (ref 0.44–1.00)
GFR, Estimated: 21 mL/min — ABNORMAL LOW (ref >60)
Glucose, Bld: 196 mg/dL — ABNORMAL HIGH (ref 70–99)
Potassium: 4.4 mmol/L (ref 3.5–5.1)
Sodium: 139 mmol/L (ref 135–145)
Total Bilirubin: 0.5 mg/dL (ref <1.2)
Total Protein: 6.6 g/dL (ref 6.5–8.1)

## 2023-02-12 LAB — DIFFERENTIAL
Abs Immature Granulocytes: 0.04 10*3/uL (ref 0.00–0.07)
Basophils Absolute: 0 10*3/uL (ref 0.0–0.1)
Basophils Relative: 0 %
Eosinophils Absolute: 0.1 10*3/uL (ref 0.0–0.5)
Eosinophils Relative: 1 %
Immature Granulocytes: 1 %
Lymphocytes Relative: 14 %
Lymphs Abs: 1.1 10*3/uL (ref 0.7–4.0)
Monocytes Absolute: 0.7 10*3/uL (ref 0.1–1.0)
Monocytes Relative: 8 %
Neutro Abs: 6 10*3/uL (ref 1.7–7.7)
Neutrophils Relative %: 76 %

## 2023-02-12 LAB — CBC
HCT: 37.1 % (ref 36.0–46.0)
Hemoglobin: 11.8 g/dL — ABNORMAL LOW (ref 12.0–15.0)
MCH: 31.1 pg (ref 26.0–34.0)
MCHC: 31.8 g/dL (ref 30.0–36.0)
MCV: 97.9 fL (ref 80.0–100.0)
Platelets: 219 10*3/uL (ref 150–400)
RBC: 3.79 MIL/uL — ABNORMAL LOW (ref 3.87–5.11)
RDW: 14.2 % (ref 11.5–15.5)
WBC: 7.9 10*3/uL (ref 4.0–10.5)
nRBC: 0 % (ref 0.0–0.2)

## 2023-02-12 LAB — URINALYSIS, ROUTINE W REFLEX MICROSCOPIC
Bilirubin Urine: NEGATIVE
Glucose, UA: 50 mg/dL — AB
Ketones, ur: NEGATIVE mg/dL
Nitrite: NEGATIVE
Protein, ur: NEGATIVE mg/dL
Specific Gravity, Urine: 1.006 (ref 1.005–1.030)
pH: 8 (ref 5.0–8.0)

## 2023-02-12 LAB — CBG MONITORING, ED: Glucose-Capillary: 176 mg/dL — ABNORMAL HIGH (ref 70–99)

## 2023-02-12 LAB — I-STAT CHEM 8, ED
BUN: 55 mg/dL — ABNORMAL HIGH (ref 8–23)
Calcium, Ion: 1.08 mmol/L — ABNORMAL LOW (ref 1.15–1.40)
Chloride: 106 mmol/L (ref 98–111)
Creatinine, Ser: 2.2 mg/dL — ABNORMAL HIGH (ref 0.44–1.00)
Glucose, Bld: 159 mg/dL — ABNORMAL HIGH (ref 70–99)
HCT: 36 % (ref 36.0–46.0)
Hemoglobin: 12.2 g/dL (ref 12.0–15.0)
Potassium: 4.1 mmol/L (ref 3.5–5.1)
Sodium: 141 mmol/L (ref 135–145)
TCO2: 24 mmol/L (ref 22–32)

## 2023-02-12 LAB — PROTIME-INR
INR: 1 (ref 0.8–1.2)
INR: 1.1 (ref 0.8–1.2)
Prothrombin Time: 13.3 s (ref 11.4–15.2)
Prothrombin Time: 14 s (ref 11.4–15.2)

## 2023-02-12 LAB — APTT: aPTT: 27 s (ref 24–36)

## 2023-02-12 LAB — MAGNESIUM: Magnesium: 2.2 mg/dL (ref 1.7–2.4)

## 2023-02-12 LAB — ETHANOL: Alcohol, Ethyl (B): <10 mg/dL (ref <10)

## 2023-02-12 MED ORDER — TRAMADOL HCL 50 MG PO TABS: 50.0000 mg | ORAL_TABLET | Freq: Every evening | ORAL | Status: DC

## 2023-02-12 MED ORDER — TRAMADOL HCL 50 MG PO TABS: 25.0000 mg | ORAL_TABLET | Freq: Every morning | ORAL | Status: DC

## 2023-02-12 MED ORDER — ASPIRIN 81 MG PO TBEC
81.0000 mg | DELAYED_RELEASE_TABLET | Freq: Every day | ORAL | Status: DC
  Administered 2023-02-12 – 2023-02-16 (×5): 81 mg via ORAL
  Filled 2023-02-12 (×5): qty 1

## 2023-02-12 MED ORDER — CLOPIDOGREL BISULFATE 75 MG PO TABS
75.0000 mg | ORAL_TABLET | Freq: Once | ORAL | Status: AC
  Administered 2023-02-12: 75 mg via ORAL
  Filled 2023-02-12: qty 1

## 2023-02-12 MED ORDER — HEPARIN SODIUM (PORCINE) 5000 UNIT/ML IJ SOLN
5000.0000 [IU] | Freq: Three times a day (TID) | INTRAMUSCULAR | Status: DC
  Administered 2023-02-12 – 2023-02-16 (×12): 5000 [IU] via SUBCUTANEOUS
  Filled 2023-02-12 (×11): qty 1

## 2023-02-12 MED ORDER — FLUTICASONE PROPIONATE 50 MCG/ACT NA SUSP
2.0000 | Freq: Every day | NASAL | Status: DC | PRN
  Filled 2023-02-12: qty 16

## 2023-02-12 MED ORDER — HYDRALAZINE HCL 20 MG/ML IJ SOLN: 10.0000 mg | INTRAMUSCULAR | Status: DC | PRN

## 2023-02-12 MED ORDER — ACETAMINOPHEN 325 MG PO TABS
650.0000 mg | ORAL_TABLET | ORAL | Status: DC | PRN
  Administered 2023-02-12 – 2023-02-15 (×5): 650 mg via ORAL
  Filled 2023-02-12 (×5): qty 2

## 2023-02-12 MED ORDER — SODIUM CHLORIDE 0.9 % IV SOLN
INTRAVENOUS | Status: AC
Start: 2023-02-12 — End: 2023-02-13

## 2023-02-12 MED ORDER — ACETAMINOPHEN 650 MG RE SUPP: 650.0000 mg | RECTAL | Status: DC | PRN

## 2023-02-12 MED ORDER — STROKE: EARLY STAGES OF RECOVERY BOOK
Freq: Once | Status: AC
  Filled 2023-02-12: qty 1

## 2023-02-12 MED ORDER — ACETAMINOPHEN 160 MG/5ML PO SOLN: 650.0000 mg | ORAL | Status: DC | PRN

## 2023-02-12 MED ORDER — TRAMADOL HCL 50 MG PO TABS: 25.0000 mg | ORAL_TABLET | ORAL | Status: DC

## 2023-02-12 MED ORDER — EMPAGLIFLOZIN 10 MG PO TABS
10.0000 mg | ORAL_TABLET | Freq: Every day | ORAL | Status: DC
  Administered 2023-02-13 – 2023-02-16 (×4): 10 mg via ORAL
  Filled 2023-02-12 (×4): qty 1

## 2023-02-12 NOTE — ED Notes (Signed)
Called floor to alert them that PT was coming up and floor put me on hold. PT is at 22 min and Charge Water quality scientist) needs the room and advised this RN to get the PT up.

## 2023-02-12 NOTE — ED Provider Notes (Signed)
[place] [facility]ENC[facility] DEPARTMENT AT [facility] Provider Note   CSN: [id number] Arrival date & time: 02/12/23  1050     History  Chief Complaint  Patient presents with   Fall    [name] is a 89 y.o. female.  HPI 89 year old female presents with weakness and numbness. She got up from a chair yesterday afternoon around 3 pm and felt both hands and legs go numb. She fell due to weakness in her legs, but no injuries. No headache, chest pain, vomiting, diarrhea, etc. She had a presumed stroke back in June 2024, but states that currently she's been having full function in her hands and legs bilaterally prior to yesterday.  Home Medications Prior to Admission medications   Medication Sig Start Date End Date Taking? Authorizing Provider  Accu-Chek Softclix Lancets lancets Use as instructed to check blood sugars up to 3 times a day  Dx code e11.65 05/17/19  Yes Dorothyann Peng, MD  acetaminophen (TYLENOL) 650 MG CR tablet Take 650-1,300 mg by mouth every 8 (eight) hours as needed for pain.   Yes [provider]  amLODipine (NORVASC) 5 MG tablet TAKE 1 TABLET(5 MG) BY MOUTH DAILY Patient taking differently: Take 5 mg by mouth daily. TAKE 1 TABLET(5 MG) BY MOUTH DAILY 10/14/21  Yes Dorothyann Peng, MD  aspirin EC 81 MG tablet Take 1 tablet (81 mg total) by mouth daily. Swallow whole. 08/14/22  Yes Danford, Earl Lites, MD  Blood Glucose Calibration (ACCU-CHEK AVIVA) SOLN Use with accu-check aviva machine 06/02/19  Yes Dorothyann Peng, MD  Blood Glucose Monitoring Suppl (ACCU-CHEK AVIVA PLUS) w/Device KIT Use to check  Blood sugars up to 3 times a day.  Dx code e11.65 12/10/20  Yes Dorothyann Peng, MD  carvedilol (COREG) 12.5 MG tablet TAKE 1 TABLET(12.5 MG) BY MOUTH TWICE DAILY WITH A MEAL Patient taking differently: Take 12.5 mg by mouth 2 (two) times daily with a meal. TAKE 1 TABLET(12.5 MG) BY MOUTH TWICE DAILY WITH A MEAL 01/16/22  Yes Dorothyann Peng, MD  fluticasone  [facility]) 50 MCG/ACT nasal spray Place 2 sprays into both nostrils daily as needed for rhinitis. 06/03/22  Yes [provider]  glucose blood test strip Use as instructed to check blood sugars up to 3 times a day.  Dx code e11.65 05/17/19  Yes Dorothyann Peng, MD  JARDIANCE 10 MG TABS tablet Take 10 mg by mouth daily. 02/01/21  Yes [provider]  Multiple Vitamin (MULTIVITAMIN) capsule Take 1 capsule by mouth daily.   Yes [provider]  nitroGLYCERIN (NITROSTAT) 0.4 MG SL tablet PLACE 1 TABLET UNDER THE TONGUE AT THE FIRST SIGN OF ATTACK, MAY REPEAT EVERY 5 MINUTES FOR 3 DOSES IN 15 MINUTES. IF PAIN PERSISTS CALL 911 Patient taking differently: Place 0.4 mg under the tongue every 5 (five) minutes as needed for chest pain. PLACE 1 TABLET UNDER THE TONGUE AT THE FIRST SIGN OF ATTACK, MAY REPEAT EVERY 5 MINUTES FOR 3 DOSES IN 15 MINUTES. IF PAIN PERSISTS CALL 911 08/24/20  Yes Dorothyann Peng, MD  polyethylene glycol (MIRALAX / GLYCOLAX) 17 g packet Take 17 g by mouth daily as needed for mild constipation. 08/13/22  Yes Danford, Earl Lites, MD  traMADol (ULTRAM) 50 MG tablet Take 0.5-1 tablets (25-50 mg total) by mouth See admin instructions. Take 1/2 tablet by mouth in the morning and 1 tablet in the evening Patient not taking: Reported on 02/12/2023 08/13/22   Alberteen Sam, MD  Allergies    Patient has no known allergies.    Review of Systems   Review of Systems  Constitutional:  Negative for fever.  Respiratory:  Negative for shortness of breath.   Cardiovascular:  Negative for chest pain.  Gastrointestinal:  Negative for abdominal pain, diarrhea and vomiting.  Neurological:  Positive for weakness and numbness. Negative for headaches.    Physical Exam Updated Vital Signs BP (!) 203/72   Pulse 61   Temp 97.9 F (36.6 C)   Resp 13   Ht 5\' 4"  (1.626 m)   Wt 56 kg   SpO2 100%   BMI 21.19 kg/m  Physical Exam Vitals and nursing note reviewed.   Constitutional:      Appearance: She is well-developed.  HENT:     Head: Normocephalic and atraumatic.  Cardiovascular:     Rate and Rhythm: Normal rate and regular rhythm.     Pulses:          Radial pulses are 2+ on the right side and 2+ on the left side.       Dorsalis pedis pulses are 2+ on the right side and 2+ on the left side.     Heart sounds: Normal heart sounds.  Pulmonary:     Effort: Pulmonary effort is normal.     Breath sounds: Normal breath sounds.  Abdominal:     Palpations: Abdomen is soft.     Tenderness: There is no abdominal tenderness.  Skin:    General: Skin is warm and dry.  Neurological:     Mental Status: She is alert.     Comments: No facial droop or slurred speech. Strength in RUE seems normal, but LUE is showing moderate weakness. Mild LLE weakness compared to RLE. She feels diffuse decreased sensation in all 4 extremities.     ED Results / Procedures / Treatments   Labs (all labs ordered are listed, but only abnormal results are displayed) Labs Reviewed  CBC - Abnormal; Notable for the following components:      Result Value   RBC 3.79 (*)    Hemoglobin 11.8 (*)    All other components within normal limits  COMPREHENSIVE METABOLIC PANEL - Abnormal; Notable for the following components:   Glucose, Bld 196 (*)    BUN 57 (*)    Creatinine, Ser 2.16 (*)    Albumin 3.3 (*)    GFR, Estimated 21 (*)    All other components within normal limits  CBG MONITORING, ED - Abnormal; Notable for the following components:   Glucose-Capillary 176 (*)    All other components within normal limits  I-STAT CHEM 8, ED - Abnormal; Notable for the following components:   BUN 55 (*)    Creatinine, Ser 2.20 (*)    Glucose, Bld 159 (*)    Calcium, Ion 1.08 (*)    All other components within normal limits  ETHANOL  DIFFERENTIAL  MAGNESIUM  PROTIME-INR  PROTIME-INR  APTT  RAPID URINE DRUG SCREEN, HOSP PERFORMED  URINALYSIS, ROUTINE W REFLEX MICROSCOPIC     EKG EKG Interpretation Date/Time:  Thursday February 12 2023 10:58:27 EST Ventricular Rate:  66 PR Interval:  156 QRS Duration:  107 QT Interval:  411 QTC Calculation: 431 R Axis:   -27  Text Interpretation: Sinus rhythm Left ventricular hypertrophy Anterior Q waves, possibly due to LVH Nonspecific T abnormalities, inferior leads no significant change since June 2024 Confirmed by Pricilla Loveless [phone]) on 02/12/2023 11:13:48 AM  Radiology CT HEAD  WO CONTRAST Result Date: 02/12/2023 CLINICAL DATA:  Neuro deficit, acute, stroke suspected. Fall. No visible injuries. EXAM: CT HEAD WITHOUT CONTRAST TECHNIQUE: Contiguous axial images were obtained from the base of the skull through the vertex without intravenous contrast. RADIATION DOSE REDUCTION: This exam was performed according to the departmental dose-optimization program which includes automated exposure control, adjustment of the mA and/or kV according to patient size and/or use of iterative reconstruction technique. COMPARISON:  CT head without contrast 08/06/2022. MR head without contrast 08/06/2022. FINDINGS: Brain: Advanced atrophy and white matter disease is similar the prior exam. Remote lacunar infarcts of the basal ganglia and corona radiata bilaterally are stable. No acute infarct, hemorrhage, or mass lesion is present. The ventricles are of normal size. No significant extraaxial fluid collection is present. The brainstem and cerebellum are within normal limits. Midline structures are within normal limits. Vascular: Atherosclerotic calcifications are present within the cavernous internal carotid arteries and at the dural margin of both vertebral arteries. Skull: Calvarium is intact. No focal lytic or blastic lesions are present. No significant extracranial soft tissue lesion is present. Sinuses/Orbits: The paranasal sinuses and mastoid air cells are clear. The globes and orbits are within normal limits. IMPRESSION: 1. No acute  intracranial abnormality or significant interval change. 2. Advanced atrophy and white matter disease likely reflects the sequela of chronic microvascular ischemia. 3. Remote lacunar infarcts of the basal ganglia and corona radiata bilaterally are stable. Electronically Signed   By: Marin Roberts M.D.   On: 02/12/2023 13:15   DG Chest Portable 1 View Result Date: 02/12/2023 CLINICAL DATA:  Left-sided weakness and numbness.  Fell yesterday. EXAM: PORTABLE CHEST 1 VIEW COMPARISON:  Chest x-ray dated June 11, 2014. FINDINGS: The patient is rotated to the left. Stable cardiomediastinal silhouette with normal heart size status post CABG. Normal pulmonary vascularity. No focal consolidation, pleural effusion, or pneumothorax. No acute osseous abnormality. IMPRESSION: No active disease. Electronically Signed   By: Obie Dredge M.D.   On: 02/12/2023 12:24    Procedures Procedures    Medications Ordered in ED Medications  clopidogrel (PLAVIX) tablet 75 mg (has no administration in time range)    ED Course/ Medical Decision Making/ A&P Clinical Course as of 02/12/23 1531  Thu Feb 12, 2023  1328 Patient's workup so far is unremarkable.  This includes negative head CT.  Patient's son is at the bedside and confirms that patient has normal grip strength in her left hand, had fully recovered her function.  Due to this we will get an MRI as the patient certainly has decreased grip strength and overall weakness in her arms.  Will do C-spine in addition to brain [SG]    Clinical Course User Index [SG] Pricilla Loveless, MD                                 Medical Decision Making Amount and/or Complexity of Data Reviewed External Data Reviewed: notes. Labs: ordered.    Details: CKD, stable Radiology: ordered and independent interpretation performed.    Details: Acute Stroke on MRI ECG/medicine tests: ordered and independent interpretation performed.    Details: No change from  baseline  Risk Prescription drug management. Decision regarding hospitalization.   Ultimately, MRI was ordered and she is found to have an acute stroke.  I think this explains her symptoms, apparently her strokelike symptoms from the summer had resolved.  Discussed with Dr. Otelia Limes, neurology will see in  consult.  He is advising to add on Plavix, will give a dose of 75 mg now.  Otherwise she will need admission, consulted Dr. Katrinka Blazing for admission.        Final Clinical Impression(s) / ED Diagnoses Final diagnoses:  Acute ischemic stroke [facility])    Rx / DC Orders ED Discharge Orders     None         Pricilla Loveless, MD 02/12/23 1601

## 2023-02-12 NOTE — Consult Note (Signed)
NEUROLOGY CONSULT NOTE   Date of service: February 12, 2023 Patient Name: [name] MRN:  [id number] DOB:  Aug 02, 1927 Chief Complaint: Fall yesterday Requesting Provider: Clydie Braun, MD  History of Present Illness  [name] is a 89 y.o. female  has a past medical history of Cerebrovascular disease, unspecified, Coronary atherosclerosis of unspecified type of vessel, native or graft, CVA (cerebral vascular accident) (HCC), Diabetes mellitus without complication (HCC), Other and unspecified hyperlipidemia, Rheumatoid arthritis(714.0), Shingles (2003), and Unspecified essential hypertension., who presented to the ED today via EMS for assessment of generalized weakness and numbness. She reported feeling increased weakness and numbness, causing her to fall at 1500 on Wednesday. Denies LOC or injuries. Today reports that she has been having continued paresthesia and weakness on the L side when interviewed by ED staff, but stated to Neurologist that her weakness and paresthesias were generalized.   CT head:  1. No acute intracranial abnormality or significant interval change. 2. Advanced atrophy and white matter disease likely reflects the sequela of chronic microvascular ischemia. 3. Remote lacunar infarcts of the basal ganglia and corona radiata bilaterally are stable.  MRI brain:  1. 11 mm acute infarct at the right thalamocapsular junction. 2. Background parenchymal atrophy, advanced chronic small vessel ischemic disease and chronic lacunar infarcts.   MRI cervical spine:  1. Motion degraded exam. 2. Spondylosis at the cervical and visualized upper thoracic levels as outlined within the body of the report. Moderate-to-prominent marrow edema within the C6 and C7 vertebral bodies, likely degenerative and progressed from the prior MRI of 08/06/2022. Cervical spondylosis has otherwise not significantly changed from this prior study. 3. Spinal canal stenosis is greatest at  C1-C2, C3-C4, C4-C5 and C6-C7 (mild-to-moderate at these levels). 4. Multilevel foraminal stenosis. Most notably, severe foraminal stenosis is present on the right at C3-C4, on the right at C4-C5, bilaterally at C6-C7 and bilaterally at C7-T1. 5. Disc degeneration is advanced at C3-C4, C4-C5 and C7-T1. 6. C5-C6 vertebral ankylosis.   LKW: 1500 Wednesday Modified rankin score: 0-Completely asymptomatic and back to baseline post- stroke IV Thrombolysis: No, out of the time window EVT:  No, exam findings are not consistent with LVO  NIHSS components Score: Comment  1a Level of Conscious 0[x]  1[]  2[]  3[]      1b LOC Questions 0[x]  1[]  2[]       1c LOC Commands 0[x]  1[]  2[]       2 Best Gaze 0[x]  1[]  2[]       3 Visual 0[x]  1[]  2[]  3[]      4 Facial Palsy 0[x]  1[]  2[]  3[]      5a Motor Arm - left 0[x]  1[]  2[]  3[]  4[]  UN[]    5b Motor Arm - Right 0[x]  1[]  2[]  3[]  4[]  UN[]    6a Motor Leg - Left 0[x]  1[]  2[]  3[]  4[]  UN[]    6b Motor Leg - Right 0[x]  1[]  2[]  3[]  4[]  UN[]    7 Limb Ataxia 0[x]  1[]  2[]  3[]  UN[]     8 Sensory 0[x]  1[x]  2[]  UN[]      9 Best Language 0[x]  1[]  2[]  3[]      10 Dysarthria 0[x]  1[]  2[]  UN[]      11 Extinct. and Inattention 0[x]  1[]  2[]       TOTAL:   1      ROS  Comprehensive ROS performed and pertinent positives documented in HPI    Past History   Past Medical History:  Diagnosis Date   Cerebrovascular disease, unspecified    Coronary atherosclerosis of unspecified type of vessel,  native or graft    CVA (cerebral vascular accident) (HCC)    Diabetes mellitus without complication (HCC)    Other and unspecified hyperlipidemia    Rheumatoid arthritis(714.0)    Shingles 2003   Unspecified essential hypertension     Past Surgical History:  Procedure Laterality Date   Cardiac Bypass  2003   CATARACT EXTRACTION, BILATERAL  2007    Family History: Family History  Problem Relation Age of Onset   Cancer Mother    Hypertension Father    Cancer Brother     Hypertension Paternal Grandmother    Heart disease Brother    Diabetes Neg Hx    Coronary artery disease Neg Hx     Social History  reports that she has never smoked. She has never used smokeless tobacco. She reports that she does not drink alcohol and does not use drugs.  No Known Allergies  Medications   Current Facility-Administered Medications:    [START ON 02/13/2023]  stroke: early stages of recovery book, , Does not apply, Once, Smith, Rondell A, MD   0.9 %  sodium chloride infusion, , Intravenous, Continuous, Smith, Rondell A, MD   acetaminophen (TYLENOL) tablet 650 mg, 650 mg, Oral, Q4H PRN **OR** acetaminophen (TYLENOL) 160 MG/5ML solution 650 mg, 650 mg, Per Tube, Q4H PRN **OR** acetaminophen (TYLENOL) suppository 650 mg, 650 mg, Rectal, Q4H PRN, Katrinka Blazing, Rondell A, MD   aspirin EC tablet 81 mg, 81 mg, Oral, Daily, Smith, Rondell A, MD   heparin injection 5,000 Units, 5,000 Units, Subcutaneous, Q8H, Smith, Rondell A, MD  Current Outpatient Medications:    Accu-Chek Softclix Lancets lancets, Use as instructed to check blood sugars up to 3 times a day  Dx code e11.65, Disp: 100 each, Rfl: 12   acetaminophen (TYLENOL) 650 MG CR tablet, Take 650-1,300 mg by mouth every 8 (eight) hours as needed for pain., Disp: , Rfl:    amLODipine (NORVASC) 5 MG tablet, TAKE 1 TABLET(5 MG) BY MOUTH DAILY (Patient taking differently: Take 5 mg by mouth daily. TAKE 1 TABLET(5 MG) BY MOUTH DAILY), Disp: 90 tablet, Rfl: 2   aspirin EC 81 MG tablet, Take 1 tablet (81 mg total) by mouth daily. Swallow whole., Disp: 30 tablet, Rfl: 12   Blood Glucose Calibration (ACCU-CHEK AVIVA) SOLN, Use with accu-check aviva machine, Disp: 1 each, Rfl: 3   Blood Glucose Monitoring Suppl (ACCU-CHEK AVIVA PLUS) w/Device KIT, Use to check  Blood sugars up to 3 times a day.  Dx code e11.65, Disp: 1 kit, Rfl: 3   carvedilol (COREG) 12.5 MG tablet, TAKE 1 TABLET(12.5 MG) BY MOUTH TWICE DAILY WITH A MEAL (Patient taking  differently: Take 12.5 mg by mouth 2 (two) times daily with a meal. TAKE 1 TABLET(12.5 MG) BY MOUTH TWICE DAILY WITH A MEAL), Disp: 180 tablet, Rfl: 1   fluticasone (FLONASE) 50 MCG/ACT nasal spray, Place 2 sprays into both nostrils daily as needed for rhinitis., Disp: , Rfl:    glucose blood test strip, Use as instructed to check blood sugars up to 3 times a day.  Dx code e11.65, Disp: 100 each, Rfl: 12   JARDIANCE 10 MG TABS tablet, Take 10 mg by mouth daily., Disp: , Rfl:    Multiple Vitamin (MULTIVITAMIN) capsule, Take 1 capsule by mouth daily., Disp: , Rfl:    nitroGLYCERIN (NITROSTAT) 0.4 MG SL tablet, PLACE 1 TABLET UNDER THE TONGUE AT THE FIRST SIGN OF ATTACK, MAY REPEAT EVERY 5 MINUTES FOR 3 DOSES IN 15 MINUTES.  IF PAIN PERSISTS CALL 911 (Patient taking differently: Place 0.4 mg under the tongue every 5 (five) minutes as needed for chest pain. PLACE 1 TABLET UNDER THE TONGUE AT THE FIRST SIGN OF ATTACK, MAY REPEAT EVERY 5 MINUTES FOR 3 DOSES IN 15 MINUTES. IF PAIN PERSISTS CALL 911), Disp: 50 tablet, Rfl: 0   polyethylene glycol (MIRALAX / GLYCOLAX) 17 g packet, Take 17 g by mouth daily as needed for mild constipation., Disp: 14 each, Rfl: 0   traMADol (ULTRAM) 50 MG tablet, Take 0.5-1 tablets (25-50 mg total) by mouth See admin instructions. Take 1/2 tablet by mouth in the morning and 1 tablet in the evening (Patient not taking: Reported on 02/26/23), Disp: 12 tablet, Rfl: 0  Vitals   Vitals:   2023/02/26 1222 02-26-2023 1345 2023/02/26 1550 26-Feb-2023 1615  BP: (!) 172/66 (!) 203/72 (!) 186/84 (!) 184/71  Pulse: 62 61 68 60  Resp: 12 13 13 13   Temp: 97.9 F (36.6 C)   98.5 F (36.9 C)  TempSrc:    Oral  SpO2: 100% 100% 100% 100%  Weight:      Height:        Body mass index is 21.19 kg/m.  Physical Exam   Physical Exam  HEENT:  [place]/AT Lungs: Respirations unlabored Extremities: Warm and well perfused.   Neurological Examination Mental Status: Awake and alert. Oriented x 5. Speech  fluent with intact naming and comprehension. No dysarthria.  Cranial Nerves: II: Visual fields intact bilaterally. No extinction to DSS.   III,IV, VI: No ptosis. EOMI. No nystagmus.  V: Temp sensation equal bilaterally VII: Smile symmetric VIII: Hearing intact to conversation IX,X: No hoarseness or hypophonia XI: Symmetric XII: Midline tongue extension Motor: Right : Upper extremity   5/5    Left:     Upper extremity   5/5  Lower extremity   5/5     Lower extremity   5/5 No pronator drift Sensory: Temp sensation is intact throughout, bilaterally. Fine touch and and light scratch stimuli are endorsed as feeling numb to patient to BUE and BLE proximally and distally without asymmetry. No extinction to DSS.  Deep Tendon Reflexes: 2+ and symmetric throughout Cerebellar: No ataxia with FNF bilaterally Gait: Deferred  Labs/Imaging/Neurodiagnostic studies   CBC:  Recent Labs  Lab 02-26-2023 1124 26-Feb-2023 1159  WBC 7.9  --   NEUTROABS 6.0  --   HGB 11.8* 12.2  HCT 37.1 36.0  MCV 97.9  --   PLT 219  --    Basic Metabolic Panel:  Lab Results  Component Value Date   NA 141 02-26-2023   K 4.1 02/26/2023   CO2 23 2023-02-26   GLUCOSE 159 (H) 02-26-2023   BUN 55 (H) 2023-02-26   CREATININE 2.20 (H) 26-Feb-2023   CALCIUM 9.0 26-Feb-2023   GFRNONAA 21 (L) 02/26/2023   GFRAA 28 06/25/2020   Lipid Panel:  Lab Results  Component Value Date   LDLCALC 91 08/07/2022   HgbA1c:  Lab Results  Component Value Date   HGBA1C 6.4 (H) 08/07/2022   Urine Drug Screen: No results found for: "LABOPIA", "COCAINSCRNUR", "LABBENZ", "AMPHETMU", "THCU", "LABBARB"  Alcohol Level     Component Value Date/Time   ETH <10 02/26/23 1124   INR  Lab Results  Component Value Date   INR 1.1 26-Feb-2023   APTT  Lab Results  Component Value Date   APTT 27 Feb 26, 2023   TTE (08/07/22):  1. Left ventricular ejection fraction, by estimation, is 60 to 65%.  The  left ventricle has normal function. The  left ventricle has no regional  wall motion abnormalities. There is mild left ventricular hypertrophy.  Left ventricular diastolic parameters are consistent with Grade I  diastolic dysfunction (impaired relaxation).   2. Right ventricular systolic function is normal. The right ventricular  size is normal. There is normal pulmonary artery systolic pressure.   3. The mitral valve is grossly normal. Mild mitral valve regurgitation.   4. The aortic valve is grossly normal. Aortic valve regurgitation is not  visualized.   5. The inferior vena cava is normal in size with greater than 50%  respiratory variability, suggesting right atrial pressure of 3 mmHg.   Carotid ultrasound (08/07/22): - Right Carotid: Velocities in the right ICA are consistent with a 1-39%  stenosis.  - Left Carotid: Velocities in the left ICA are consistent with a 1-39%  stenosis.  - Vertebrals: Bilateral vertebral arteries demonstrate antegrade flow.  - Subclavians: Normal flow hemodynamics were seen in bilateral subclavian arteries.    ASSESSMENT  89 year old female presenting with right sided numbness and weakness. MRI brain reveals an 11 mm acute infarct at the right thalamocapsular junction. - Exam reveals subjective decreased touch sensation in all 4 extremities without asymmetry. No focal weakness noted.  - HgbA1c in June was elevated at 6.4 - BUN 55 and Cr 2.2. Also with similar derangement in June of this year.  - Stroke risk factors: Advanced age, CAD, prior CVA, DM, hyperlipidemia and HTN.  RECOMMENDATIONS  - Add Plavix to ASA and continue DAPT indefinitely.  - Fasting lipid panel - MRA of the brain without contrast - PT consult, OT consult, Speech consult - Has had recent echocardiogram (see above) - Has had recent carotid ultrasound (see above) - Risks of stain most likely outweigh potential benefits in this frail patient of advanced age - Risk factor modification - Telemetry monitoring - Frequent  neuro checks - NPO until passes stroke swallow screen - BP management per standard protocol. Out of the permissive HTN time window.     ______________________________________________________________________    [name], Shannon Mikkelsen, MD [facility]

## 2023-02-12 NOTE — ED Triage Notes (Signed)
Pt arrives via [facility] from home for fall yesterday. Pt not anticoagulated. Pt reports feeling increased weakness and numbness, causing her to fall at 1500. Denies LOC, no injuries per pt. Pt was able to be helped up by family. Today reports that she has been having continued paresthesia and weakness on the L side.

## 2023-02-12 NOTE — ED Notes (Signed)
PT passed swallow screen with no difficulty.

## 2023-02-12 NOTE — ED Notes (Signed)
Lab called with a hemolyzed blue tube. Will send a second one down

## 2023-02-12 NOTE — Plan of Care (Signed)

## 2023-02-12 NOTE — ED Notes (Signed)
Spoke to Receiving RN and gave report, she accepted PT

## 2023-02-12 NOTE — H&P (Addendum)
History and Physical    Patient: [name] ZOX:[id number] DOB: 1927-06-12 DOA: 02/12/2023 DOS: the patient was seen and examined on 02/12/2023 PCP: Nona Dell, NP  Patient coming from: Home via EMS  Chief Complaint:  Chief Complaint  Patient presents with   Fall   HPI: [name] is a 89 y.o. female with medical history significant of hypertension, CAD s/p CABG, CVA, diabetes mellitus type 2, rheumatoid arthritis presents with sudden bilateral loss of sensation in the hands and legs, leading to a fall. The occurred when attempting to transfer to her wheelchair at around 3 pm to open the door for a family friend who came over. Denied any head injury during the fall. The patient reported that she usually ambulates with a wheelchair due to the inability to stand related to history of sciatica, previously having used a walker. The patient lives alone and is primarily self-caring.  The family friend had seen her fall and called her son who lives next-door and they were able to get her up.  However, patient reported continued weakness as well as numbness that seems to be worse on the left side.  Also noted having some pain in the right hip joint and down the right leg. The pain was exacerbated by lifting the leg. The patient was able to lift her left leg with less difficulty.  Records note patient had been hospitalized in June of this year after presenting with left upper extremity weakness.  MRI of the brain was negative at that time, but neurology felt clinical syndrome was compelling and likely an MRI negative stroke after stopping aspirin.  At that time she was recommended to be on dual antiplatelet therapy for at least 3 weeks followed by aspirin alone indefinitely and Crestor was started.  She reports that she has been taking her medications including aspirin and Crestor as prescribed   In the emergency department patient was noted to be afebrile with blood pressures elevated  up to 203/72, and all other vital signs maintained.  Labs 57, creatinine 2.16, and glucose 196.  Chest x-ray no acute abnormality. CT scan of the head did not note any acute abnormality.  Subsequent MRI of the brain significant for 11 mm acute infarct at the right thalami with capsular junction.  Neurology had been consulted and reccomended adding back Plavix.  Review of Systems: As mentioned in the history of present illness. All other systems reviewed and are negative. Past Medical History:  Diagnosis Date   Cerebrovascular disease, unspecified    Coronary atherosclerosis of unspecified type of vessel, native or graft    CVA (cerebral vascular accident) (HCC)    Diabetes mellitus without complication (HCC)    Other and unspecified hyperlipidemia    Rheumatoid arthritis(714.0)    Shingles 2003   Unspecified essential hypertension    Past Surgical History:  Procedure Laterality Date   Cardiac Bypass  2003   CATARACT EXTRACTION, BILATERAL  2007   Social History:  reports that she has never smoked. She has never used smokeless tobacco. She reports that she does not drink alcohol and does not use drugs.  No Known Allergies  Family History  Problem Relation Age of Onset   Cancer Mother    Hypertension Father    Cancer Brother    Hypertension Paternal Grandmother    Heart disease Brother    Diabetes Neg Hx    Coronary artery disease Neg Hx     Prior to Admission medications   Medication Sig  Start Date End Date Taking? Authorizing Provider  Accu-Chek Softclix Lancets lancets Use as instructed to check blood sugars up to 3 times a day  Dx code e11.65 05/17/19  Yes Dorothyann Peng, MD  acetaminophen (TYLENOL) 650 MG CR tablet Take 650-1,300 mg by mouth every 8 (eight) hours as needed for pain.   Yes [provider]  amLODipine (NORVASC) 5 MG tablet TAKE 1 TABLET(5 MG) BY MOUTH DAILY Patient taking differently: Take 5 mg by mouth daily. TAKE 1 TABLET(5 MG) BY MOUTH DAILY  10/14/21  Yes Dorothyann Peng, MD  aspirin EC 81 MG tablet Take 1 tablet (81 mg total) by mouth daily. Swallow whole. 08/14/22  Yes Danford, Earl Lites, MD  Blood Glucose Calibration (ACCU-CHEK AVIVA) SOLN Use with accu-check aviva machine 06/02/19  Yes Dorothyann Peng, MD  Blood Glucose Monitoring Suppl (ACCU-CHEK AVIVA PLUS) w/Device KIT Use to check  Blood sugars up to 3 times a day.  Dx code e11.65 12/10/20  Yes Dorothyann Peng, MD  carvedilol (COREG) 12.5 MG tablet TAKE 1 TABLET(12.5 MG) BY MOUTH TWICE DAILY WITH A MEAL Patient taking differently: Take 12.5 mg by mouth 2 (two) times daily with a meal. TAKE 1 TABLET(12.5 MG) BY MOUTH TWICE DAILY WITH A MEAL 01/16/22  Yes Dorothyann Peng, MD  fluticasone [facility]) 50 MCG/ACT nasal spray Place 2 sprays into both nostrils daily as needed for rhinitis. 06/03/22  Yes [provider]  glucose blood test strip Use as instructed to check blood sugars up to 3 times a day.  Dx code e11.65 05/17/19  Yes Dorothyann Peng, MD  JARDIANCE 10 MG TABS tablet Take 10 mg by mouth daily. 02/01/21  Yes [provider]  Multiple Vitamin (MULTIVITAMIN) capsule Take 1 capsule by mouth daily.   Yes [provider]  nitroGLYCERIN (NITROSTAT) 0.4 MG SL tablet PLACE 1 TABLET UNDER THE TONGUE AT THE FIRST SIGN OF ATTACK, MAY REPEAT EVERY 5 MINUTES FOR 3 DOSES IN 15 MINUTES. IF PAIN PERSISTS CALL 911 Patient taking differently: Place 0.4 mg under the tongue every 5 (five) minutes as needed for chest pain. PLACE 1 TABLET UNDER THE TONGUE AT THE FIRST SIGN OF ATTACK, MAY REPEAT EVERY 5 MINUTES FOR 3 DOSES IN 15 MINUTES. IF PAIN PERSISTS CALL 911 08/24/20  Yes Dorothyann Peng, MD  polyethylene glycol (MIRALAX / GLYCOLAX) 17 g packet Take 17 g by mouth daily as needed for mild constipation. 08/13/22  Yes Danford, Earl Lites, MD  traMADol (ULTRAM) 50 MG tablet Take 0.5-1 tablets (25-50 mg total) by mouth See admin instructions. Take 1/2 tablet by mouth in the  morning and 1 tablet in the evening Patient not taking: Reported on 02/12/2023 08/13/22   Alberteen Sam, MD    Physical Exam: Vitals:   02/12/23 1200 02/12/23 1222 02/12/23 1345 02/12/23 1550  BP: (!) 147/81 (!) 172/66 (!) 203/72 (!) 186/84  Pulse: (!) 59 62 61 68  Resp: 17 12 13 13   Temp:  97.9 F (36.6 C)    TempSrc:      SpO2: 100% 100% 100% 100%  Weight:      Height:        Constitutional: Elderly female in NAD, calm, comfortable Eyes: PERRL, lids and conjunctivae normal ENMT: Mucous membranes are moist.   Fair dentition.  Neck: normal, supple Respiratory: clear to auscultation bilaterally, no wheezing, no crackles. Normal respiratory effort. No accessory muscle use.  Cardiovascular: Regular rate and rhythm, no murmurs / rubs / gallops. 2+ pedal pulses.  Abdomen:  no tenderness, no masses palpated.   Bowel sounds positive.  Musculoskeletal: no clubbing / cyanosis. No joint deformity upper and lower extremities.Normal muscle tone.  Skin: no rashes, lesions, ulcers. No induration Neurologic: CN 2-12 grossly intact. Sensation intact, DTR normal.  4/5 in the right upper and lower extremity.  3/5 in the left upper extremity.  Abnormal sensation. Psychiatric: Normal judgment and insight. Alert and oriented x 3. Normal mood.   Data Reviewed:  EKG  revealed reviewed labs, imaging, and pertinent records as documented.  Assessment and Plan: CVA Acute.  Patient presents with acute onset of loss of sensation and numbness of the bilateral upper and lower extremities, but reports continued symptoms and weakness more noticeably on the left side than on the right.  CT scan of the head did not note any acute abnormality, but MRI significant for 11 mm acute infarct in the right  thalamocapsular junction.  Following previous stroke in June patient had been on dual antiplatelet therapy for 3 weeks and then recommended to continue aspirin alone which she had done. -Admit to a telemetry  bed -Stroke order set utilized -Check lipid panel and hemoglobin A1c -Check echocardiogram -Resume Plavix -Continue aspirin -Follow-up telemetry overnight -Neurology consultative services we will follow-up for any further recommendations  Controlled diabetes mellitus type 2, without long-term use of insulin On admission glucose noted to be elevated up to 196.  Last hemoglobin A1c was noted to be 6.4 last checked 07/2022. -Follow-up repeat hemoglobin A1c -Continue Jardiance  Essential hypertension Home blood pressure regimen appears to include amlodipine 5 mg daily and Coreg 12.5 mg twice daily. -Holding current home blood pressure medications allowing for permissive hypertension in the setting of acute stroke -Hydralazine IV as needed for systolic blood pressures greater than 220 or diastolic blood pressures greater than 110  Hyperlipidemia Patient was started on Crestor during last hospitalization, but does not appear to be on her current medication list. -Follow-up lipid panel  Chronic kidney disease stage IV Creatinine 2.16 with BUN 57 which appears around patient's baseline. -Check urinalysis -Continue to monitor  Anemia of chronic disease Chronic.  Hemoglobin 11.8 which appears around patient's baseline. -Continue to monitor  Right hip pain Arthritis of the right hip Chronic.  Patient reports having right hip pain for which she has previously been seen by orthopedics for radiculopathy and osteoarthritis of the hip.  Patient reports having hallucinations with tramadol. -Continue Tylenol and warm compresses as needed   DVT prophylaxis: Lovenox Advance Care Planning:   Code Status: Do not attempt resuscitation (DNR) PRE-ARREST INTERVENTIONS DESIRED.  Confirmed with the patient present at bedside  Consults: Neurology  Family Communication: Son of the head over the phone  Severity of Illness: The appropriate patient status for this patient is INPATIENT. Inpatient status is  judged to be reasonable and necessary in order to provide the required intensity of service to ensure the patient's safety. The patient's presenting symptoms, physical exam findings, and initial radiographic and laboratory data in the context of their chronic comorbidities is felt to place them at high risk for further clinical deterioration. Furthermore, it is not anticipated that the patient will be medically stable for discharge from the hospital within 2 midnights of admission.   * I certify that at the point of admission it is my clinical judgment that the patient will require inpatient hospital care spanning beyond 2 midnights from the point of admission due to high intensity of service, high risk for further deterioration and high frequency of  surveillance required.*  Author: Clydie Braun, MD 02/12/2023 3:59 PM  For on call review [email or web address].

## 2023-02-12 NOTE — ED Notes (Signed)
ED TO INPATIENT HANDOFF REPORT  ED Nurse Name and Phone #: Rodney Booze [phone]  S Name/Age/Gender [name] 89 y.o. female Room/Bed: 026C/026C  Code Status   Code Status: Do not attempt resuscitation (DNR) PRE-ARREST INTERVENTIONS DESIRED  Home/SNF/Other Home Patient oriented to: self, place, time, and situation Is this baseline? Yes   Triage Complete: Triage complete  Chief Complaint CVA (cerebral vascular accident) [facility]) [I63.9]  Triage Note Pt arrives via [facility] from home for fall yesterday. Pt not anticoagulated. Pt reports feeling increased weakness and numbness, causing her to fall at 1500. Denies LOC, no injuries per pt. Pt was able to be helped up by family. Today reports that she has been having continued paresthesia and weakness on the L side.    Allergies No Known Allergies  Level of Care/Admitting Diagnosis ED Disposition     ED Disposition  Admit   Condition  --   Comment  Hospital Area: [facility] [[id number]]  Level of Care: Telemetry Medical [104]  May admit patient to [facility] or [facility] if equivalent level of care is available:: No  Covid Evaluation: Asymptomatic - no recent exposure (last 10 days) testing not required  Diagnosis: CVA (cerebral vascular accident) [facility]) [[id number]]  Admitting Physician: Clydie Braun [[id number]]  Attending Physician: Clydie Braun [[id number]]  Certification:: I certify this patient will need inpatient services for at least 2 midnights  Expected Medical Readiness: 02/14/2023          B Medical/Surgery History Past Medical History:  Diagnosis Date   Cerebrovascular disease, unspecified    Coronary atherosclerosis of unspecified type of vessel, native or graft    CVA (cerebral vascular accident) (HCC)    Diabetes mellitus without complication (HCC)    Other and unspecified hyperlipidemia    Rheumatoid arthritis(714.0)    Shingles 2003   Unspecified essential hypertension    Past  Surgical History:  Procedure Laterality Date   Cardiac Bypass  2003   CATARACT EXTRACTION, BILATERAL  2007     A IV Location/Drains/Wounds Patient Lines/Drains/Airways Status     Active Line/Drains/Airways     Name Placement date Placement time Site Days   Peripheral IV 02/12/23 20 G 1" Left Antecubital 02/12/23  1109  Antecubital  less than 1            Intake/Output Last 24 hours No intake or output data in the 24 hours ending 02/12/23 1838  Labs/Imaging Results for orders placed or performed during the hospital encounter of 02/12/23 (from the past 48 hours)  CBG monitoring, ED     Status: Abnormal   Collection Time: 02/12/23 11:24 AM  Result Value Ref Range   Glucose-Capillary 176 (H) 70 - 99 mg/dL    Comment: Glucose reference range applies only to samples taken after fasting for at least 8 hours.  Ethanol     Status: None   Collection Time: 02/12/23 11:24 AM  Result Value Ref Range   Alcohol, Ethyl (B) <10 <10 mg/dL    Comment: (NOTE) Lowest detectable limit for serum alcohol is 10 mg/dL.  For medical purposes only. Performed at [facility], [address], [place], [place] [address]   CBC     Status: Abnormal   Collection Time: 02/12/23 11:24 AM  Result Value Ref Range   WBC 7.9 4.0 - 10.5 K/uL   RBC 3.79 (L) 3.87 - 5.11 MIL/uL   Hemoglobin 11.8 (L) 12.0 - 15.0 g/dL   HCT 96.2 95.2 - 84.1 %  MCV 97.9 80.0 - 100.0 fL   MCH 31.1 26.0 - 34.0 pg   MCHC 31.8 30.0 - 36.0 g/dL   RDW 32.3 55.7 - 32.2 %   Platelets 219 150 - 400 K/uL   nRBC 0.0 0.0 - 0.2 %    Comment: Performed at [facility], [address], [place], [place] [address]  Differential     Status: None   Collection Time: 02/12/23 11:24 AM  Result Value Ref Range   Neutrophils Relative % 76 %   Neutro Abs 6.0 1.7 - 7.7 K/uL   Lymphocytes Relative 14 %   Lymphs Abs 1.1 0.7 - 4.0 K/uL   Monocytes Relative 8 %   Monocytes Absolute 0.7 0.1 - 1.0 K/uL   Eosinophils Relative 1 %    Eosinophils Absolute 0.1 0.0 - 0.5 K/uL   Basophils Relative 0 %   Basophils Absolute 0.0 0.0 - 0.1 K/uL   Immature Granulocytes 1 %   Abs Immature Granulocytes 0.04 0.00 - 0.07 K/uL    Comment: Performed at [facility], [address], [place], [place] [address]  Comprehensive metabolic panel     Status: Abnormal   Collection Time: 02/12/23 11:24 AM  Result Value Ref Range   Sodium 139 135 - 145 mmol/L   Potassium 4.4 3.5 - 5.1 mmol/L   Chloride 104 98 - 111 mmol/L   CO2 23 22 - 32 mmol/L   Glucose, Bld 196 (H) 70 - 99 mg/dL    Comment: Glucose reference range applies only to samples taken after fasting for at least 8 hours.   BUN 57 (H) 8 - 23 mg/dL   Creatinine, Ser 7.62 (H) 0.44 - 1.00 mg/dL   Calcium 9.0 8.9 - 83.1 mg/dL   Total Protein 6.6 6.5 - 8.1 g/dL   Albumin 3.3 (L) 3.5 - 5.0 g/dL   AST 28 15 - 41 U/L   ALT 15 0 - 44 U/L   Alkaline Phosphatase 77 38 - 126 U/L   Total Bilirubin 0.5 <1.2 mg/dL   GFR, Estimated 21 (L) >60 mL/min    Comment: (NOTE) Calculated using the CKD-EPI Creatinine Equation (2021)    Anion gap 12 5 - 15    Comment: Performed at [facility], [address], [place], [place] [address]  Magnesium     Status: None   Collection Time: 02/12/23 11:24 AM  Result Value Ref Range   Magnesium 2.2 1.7 - 2.4 mg/dL    Comment: Performed at [facility], [address], [place], [place] [address]  Protime-INR     Status: None   Collection Time: 02/12/23 11:55 AM  Result Value Ref Range   Prothrombin Time 13.3 11.4 - 15.2 seconds    Comment: QUESTIONABLE RESULTS, RECOMMEND RECOLLECT TO VERIFY SPECIMEN CLOTTED NOTIFIED Wandra Mannan, RN 1314 02/12/2023 BY MACEDA,J.    INR 1.0 0.8 - 1.2    Comment: QUESTIONABLE RESULTS, RECOMMEND RECOLLECT TO VERIFY SPECIMEN CLOTTED TJon Billings, RN 1314 02/12/2023 BY MACEDA,J. (NOTE) INR goal varies based on device and disease states. Performed at [facility], [address] [place],  [place] [address]   I-stat chem 8, ED     Status: Abnormal   Collection Time: 02/12/23 11:59 AM  Result Value Ref Range   Sodium 141 135 - 145 mmol/L   Potassium 4.1 3.5 - 5.1 mmol/L   Chloride 106 98 - 111 mmol/L   BUN 55 (H) 8 - 23  mg/dL   Creatinine, Ser 0.98 (H) 0.44 - 1.00 mg/dL   Glucose, Bld 119 (H) 70 - 99 mg/dL    Comment: Glucose reference range applies only to samples taken after fasting for at least 8 hours.   Calcium, Ion 1.08 (L) 1.15 - 1.40 mmol/L   TCO2 24 22 - 32 mmol/L   Hemoglobin 12.2 12.0 - 15.0 g/dL   HCT 14.7 82.9 - 56.2 %  Protime-INR     Status: None   Collection Time: 02/12/23  2:43 PM  Result Value Ref Range   Prothrombin Time 14.0 11.4 - 15.2 seconds   INR 1.1 0.8 - 1.2    Comment: (NOTE) INR goal varies based on device and disease states. Performed at [facility], [address] [place], [place] [address]   APTT     Status: None   Collection Time: 02/12/23  2:43 PM  Result Value Ref Range   aPTT 27 24 - 36 seconds    Comment: Performed at [facility], [address] [place], [place] [address]   MR BRAIN WO CONTRAST Result Date: 02/12/2023 CLINICAL DATA:  Provided history: Neuro deficit, acute, stroke suspected. Additional history provided: Recent fall. Increased weakness/numbness. Paresthesias and weakness on left side. EXAM: MRI HEAD WITHOUT CONTRAST MRI CERVICAL SPINE WITHOUT CONTRAST TECHNIQUE: Multiplanar, multiecho pulse sequences of the brain and surrounding structures, and cervical spine, to include the craniocervical junction and cervicothoracic junction, were obtained without intravenous contrast. COMPARISON:  Head CT 02/12/2023. Brain MRI 08/06/2022. Cervical spine MRI 08/06/2022. FINDINGS: MRI HEAD FINDINGS Brain: Generalized cerebral atrophy. 11 mm acute infarct at the right thalamocapsular junction. Chronic lacunar infarcts again noted within the bilateral cerebral hemispheric white matter and within/about the bilateral deep gray  nuclei. Background advanced multifocal T2 FLAIR hyperintense signal abnormality within the cerebral white matter, nonspecific but compatible with chronic small vessel ischemic disease. Minimal chronic small vessel ischemic changes also noted within the pons. No evidence of an intracranial mass. No extra-axial fluid collection. No midline shift. Vascular: Maintained flow voids within the proximal large arterial vessels. Skull and upper cervical spine: No focal worrisome marrow lesion. Sinuses/Orbits: No mass or acute finding within the imaged orbits. Prior bilateral ocular lens replacement. No significant paranasal sinus disease. MRI CERVICAL SPINE FINDINGS The axial T2 sequences are moderately motion degraded. Within this limitation, findings are as follows. Alignment: Grade 1 anterolisthesis at C3-C4, C4-C5, C7-T1, T1-T2 and T2-T3. Vertebrae: Cervical vertebral body height is maintained. Vertebral body and left-sided facet ankylosis at C5-C6. Moderate to prominent marrow edema at C6-C7, progressed from the prior cervical spine MRI of 08/06/2022 and likely degenerative. Degenerative marrow edema also again noted at the C1-C2 level. Cord: Within limitations of motion degradation, no signal abnormality is identified within the cervical spinal cord. Posterior Fossa, vertebral arteries, paraspinal tissues: Posterior fossa assessed on concurrently performed brain MRI. Flow voids preserved within the imaged cervical vertebral arteries. No paraspinal mass or collection. Disc levels: Unless otherwise stated, the level by level findings below have not significantly changed from the prior MRI of 08/06/2022. Multilevel disc degeneration. At the non-fused levels, disc degeneration is greatest at C3-C4, C4-C5 and C7-T1 (advanced at these levels). C1-C2: Ligamentous hypertrophy posterior to the dens. Ligamentum flavum thickening. Mild-to-moderate spinal canal stenosis with mild spinal cord flattening. C2-C3: Posterior disc  osteophyte complex with right greater than left disc osteophyte ridge/uncinate hypertrophy. Facet arthropathy (greater on the left). Ligamentum flavum thickening. Mild spinal canal narrowing. Moderate right neural foraminal narrowing.  C3-C4: Grade 1 anterolisthesis. Disc uncovering. Posterior disc osteophyte complex with right-sided disc osteophyte ridge/uncinate hypertrophy. Facet arthropathy (greater on the left). Ligamentum flavum thickening. Mild-to-moderate spinal canal stenosis. Bilateral neural foraminal narrowing (severe right, moderate left). C4-C5: Grade 1 anterolisthesis. Disc uncovering. Posterior disc osteophyte complex with bilateral disc osteophyte ridge/uncinate hypertrophy. Facet arthropathy. Ligamentum flavum thickening. Mild-to-moderate spinal canal stenosis. Bilateral neural foraminal narrowing (severe right, moderate left). C5-C6: Vertebral ankylosis. Vertebral osteophytosis. Bilateral uncovertebral hypertrophy. No significant spinal canal stenosis. Bilateral neural foraminal narrowing (moderate right, mild left). C6-C7: Posterior disc osteophyte complex with bilateral disc osteophyte ridge/uncinate hypertrophy. Facet arthropathy (greater on the right). Ligamentum flavum thickening. Mild-to-moderate spinal canal stenosis. Severe bilateral neural foraminal narrowing. C7-T1: Grade 1 anterolisthesis. Disc uncovering with disc bulge. Facet arthropathy (greater on the left). Mild spinal canal stenosis. Severe bilateral neural foraminal narrowing. T1-T2: Grade 1 anterolisthesis. Disc uncovering with disc bulge. Facet arthropathy. Ligamentum flavum thickening. Mild spinal canal narrowing. Bilateral neural foraminal narrowing (moderate right, mild left). IMPRESSION: MRI brain: 1. 11 mm acute infarct at the right thalamocapsular junction. 2. Background parenchymal atrophy, advanced chronic small vessel ischemic disease and chronic lacunar infarcts as described. MRI cervical spine: 1. Motion degraded  exam. 2. Spondylosis at the cervical and visualized upper thoracic levels as outlined within the body of the report. Moderate-to-prominent marrow edema within the C6 and C7 vertebral bodies, likely degenerative and progressed from the prior MRI of 08/06/2022. Cervical spondylosis has otherwise not significantly changed from this prior study. 3. Spinal canal stenosis is greatest at C1-C2, C3-C4, C4-C5 and C6-C7 (mild-to-moderate at these levels). 4. Multilevel foraminal stenosis. Most notably, severe foraminal stenosis is present on the right at C3-C4, on the right at C4-C5, bilaterally at C6-C7 and bilaterally at C7-T1. 5. Disc degeneration is advanced at C3-C4, C4-C5 and C7-T1. 6. C5-C6 vertebral ankylosis. Electronically Signed   By: Jackey Loge D.O.   On: 02/12/2023 15:30   MR Cervical Spine Wo Contrast Result Date: 02/12/2023 CLINICAL DATA:  Provided history: Neuro deficit, acute, stroke suspected. Additional history provided: Recent fall. Increased weakness/numbness. Paresthesias and weakness on left side. EXAM: MRI HEAD WITHOUT CONTRAST MRI CERVICAL SPINE WITHOUT CONTRAST TECHNIQUE: Multiplanar, multiecho pulse sequences of the brain and surrounding structures, and cervical spine, to include the craniocervical junction and cervicothoracic junction, were obtained without intravenous contrast. COMPARISON:  Head CT 02/12/2023. Brain MRI 08/06/2022. Cervical spine MRI 08/06/2022. FINDINGS: MRI HEAD FINDINGS Brain: Generalized cerebral atrophy. 11 mm acute infarct at the right thalamocapsular junction. Chronic lacunar infarcts again noted within the bilateral cerebral hemispheric white matter and within/about the bilateral deep gray nuclei. Background advanced multifocal T2 FLAIR hyperintense signal abnormality within the cerebral white matter, nonspecific but compatible with chronic small vessel ischemic disease. Minimal chronic small vessel ischemic changes also noted within the pons. No evidence of an  intracranial mass. No extra-axial fluid collection. No midline shift. Vascular: Maintained flow voids within the proximal large arterial vessels. Skull and upper cervical spine: No focal worrisome marrow lesion. Sinuses/Orbits: No mass or acute finding within the imaged orbits. Prior bilateral ocular lens replacement. No significant paranasal sinus disease. MRI CERVICAL SPINE FINDINGS The axial T2 sequences are moderately motion degraded. Within this limitation, findings are as follows. Alignment: Grade 1 anterolisthesis at C3-C4, C4-C5, C7-T1, T1-T2 and T2-T3. Vertebrae: Cervical vertebral body height is maintained. Vertebral body and left-sided facet ankylosis at C5-C6. Moderate to prominent marrow edema at C6-C7, progressed from the prior cervical spine MRI of 08/06/2022 and likely degenerative. Degenerative marrow edema  also again noted at the C1-C2 level. Cord: Within limitations of motion degradation, no signal abnormality is identified within the cervical spinal cord. Posterior Fossa, vertebral arteries, paraspinal tissues: Posterior fossa assessed on concurrently performed brain MRI. Flow voids preserved within the imaged cervical vertebral arteries. No paraspinal mass or collection. Disc levels: Unless otherwise stated, the level by level findings below have not significantly changed from the prior MRI of 08/06/2022. Multilevel disc degeneration. At the non-fused levels, disc degeneration is greatest at C3-C4, C4-C5 and C7-T1 (advanced at these levels). C1-C2: Ligamentous hypertrophy posterior to the dens. Ligamentum flavum thickening. Mild-to-moderate spinal canal stenosis with mild spinal cord flattening. C2-C3: Posterior disc osteophyte complex with right greater than left disc osteophyte ridge/uncinate hypertrophy. Facet arthropathy (greater on the left). Ligamentum flavum thickening. Mild spinal canal narrowing. Moderate right neural foraminal narrowing. C3-C4: Grade 1 anterolisthesis. Disc uncovering.  Posterior disc osteophyte complex with right-sided disc osteophyte ridge/uncinate hypertrophy. Facet arthropathy (greater on the left). Ligamentum flavum thickening. Mild-to-moderate spinal canal stenosis. Bilateral neural foraminal narrowing (severe right, moderate left). C4-C5: Grade 1 anterolisthesis. Disc uncovering. Posterior disc osteophyte complex with bilateral disc osteophyte ridge/uncinate hypertrophy. Facet arthropathy. Ligamentum flavum thickening. Mild-to-moderate spinal canal stenosis. Bilateral neural foraminal narrowing (severe right, moderate left). C5-C6: Vertebral ankylosis. Vertebral osteophytosis. Bilateral uncovertebral hypertrophy. No significant spinal canal stenosis. Bilateral neural foraminal narrowing (moderate right, mild left). C6-C7: Posterior disc osteophyte complex with bilateral disc osteophyte ridge/uncinate hypertrophy. Facet arthropathy (greater on the right). Ligamentum flavum thickening. Mild-to-moderate spinal canal stenosis. Severe bilateral neural foraminal narrowing. C7-T1: Grade 1 anterolisthesis. Disc uncovering with disc bulge. Facet arthropathy (greater on the left). Mild spinal canal stenosis. Severe bilateral neural foraminal narrowing. T1-T2: Grade 1 anterolisthesis. Disc uncovering with disc bulge. Facet arthropathy. Ligamentum flavum thickening. Mild spinal canal narrowing. Bilateral neural foraminal narrowing (moderate right, mild left). IMPRESSION: MRI brain: 1. 11 mm acute infarct at the right thalamocapsular junction. 2. Background parenchymal atrophy, advanced chronic small vessel ischemic disease and chronic lacunar infarcts as described. MRI cervical spine: 1. Motion degraded exam. 2. Spondylosis at the cervical and visualized upper thoracic levels as outlined within the body of the report. Moderate-to-prominent marrow edema within the C6 and C7 vertebral bodies, likely degenerative and progressed from the prior MRI of 08/06/2022. Cervical spondylosis has  otherwise not significantly changed from this prior study. 3. Spinal canal stenosis is greatest at C1-C2, C3-C4, C4-C5 and C6-C7 (mild-to-moderate at these levels). 4. Multilevel foraminal stenosis. Most notably, severe foraminal stenosis is present on the right at C3-C4, on the right at C4-C5, bilaterally at C6-C7 and bilaterally at C7-T1. 5. Disc degeneration is advanced at C3-C4, C4-C5 and C7-T1. 6. C5-C6 vertebral ankylosis. Electronically Signed   By: Jackey Loge D.O.   On: 02/12/2023 15:30   CT HEAD WO CONTRAST Result Date: 02/12/2023 CLINICAL DATA:  Neuro deficit, acute, stroke suspected. Fall. No visible injuries. EXAM: CT HEAD WITHOUT CONTRAST TECHNIQUE: Contiguous axial images were obtained from the base of the skull through the vertex without intravenous contrast. RADIATION DOSE REDUCTION: This exam was performed according to the departmental dose-optimization program which includes automated exposure control, adjustment of the mA and/or kV according to patient size and/or use of iterative reconstruction technique. COMPARISON:  CT head without contrast 08/06/2022. MR head without contrast 08/06/2022. FINDINGS: Brain: Advanced atrophy and white matter disease is similar the prior exam. Remote lacunar infarcts of the basal ganglia and corona radiata bilaterally are stable. No acute infarct, hemorrhage, or mass lesion is present. The ventricles are  of normal size. No significant extraaxial fluid collection is present. The brainstem and cerebellum are within normal limits. Midline structures are within normal limits. Vascular: Atherosclerotic calcifications are present within the cavernous internal carotid arteries and at the dural margin of both vertebral arteries. Skull: Calvarium is intact. No focal lytic or blastic lesions are present. No significant extracranial soft tissue lesion is present. Sinuses/Orbits: The paranasal sinuses and mastoid air cells are clear. The globes and orbits are within  normal limits. IMPRESSION: 1. No acute intracranial abnormality or significant interval change. 2. Advanced atrophy and white matter disease likely reflects the sequela of chronic microvascular ischemia. 3. Remote lacunar infarcts of the basal ganglia and corona radiata bilaterally are stable. Electronically Signed   By: Marin Roberts M.D.   On: 02/12/2023 13:15   DG Chest Portable 1 View Result Date: 02/12/2023 CLINICAL DATA:  Left-sided weakness and numbness.  Fell yesterday. EXAM: PORTABLE CHEST 1 VIEW COMPARISON:  Chest x-ray dated June 11, 2014. FINDINGS: The patient is rotated to the left. Stable cardiomediastinal silhouette with normal heart size status post CABG. Normal pulmonary vascularity. No focal consolidation, pleural effusion, or pneumothorax. No acute osseous abnormality. IMPRESSION: No active disease. Electronically Signed   By: Obie Dredge M.D.   On: 02/12/2023 12:24    Pending Labs Unresulted Labs (From admission, onward)     Start     Ordered   02/12/23 1623  Hemoglobin A1c  (Labs)  Add-on,   AD       Comments: To assess prior glycemic control    02/12/23 1624   02/12/23 1623  Lipid panel  (Labs)  Add-on,   AD       Comments: Fasting    02/12/23 1624   02/12/23 1124  Urine rapid drug screen (hosp performed)  Once,   STAT        02/12/23 1123   02/12/23 1124  Urinalysis, Routine w reflex microscopic -Urine, Clean Catch  Once,   URGENT       Question:  Specimen Source  Answer:  Urine, Clean Catch   02/12/23 1123            Vitals/Pain Today's Vitals   02/12/23 1222 02/12/23 1345 02/12/23 1550 02/12/23 1615  BP: (!) 172/66 (!) 203/72 (!) 186/84 (!) 184/71  Pulse: 62 61 68 60  Resp: 12 13 13 13   Temp: 97.9 F (36.6 C)   98.5 F (36.9 C)  TempSrc:    Oral  SpO2: 100% 100% 100% 100%  Weight:      Height:      PainSc:        Isolation Precautions No active isolations  Medications Medications  aspirin EC tablet 81 mg (81 mg Oral Given  02/12/23 1728)   stroke: early stages of recovery book (has no administration in time range)  0.9 %  sodium chloride infusion ( Intravenous New Bag/Given 02/12/23 1734)  acetaminophen (TYLENOL) tablet 650 mg (650 mg Oral Given 02/12/23 1728)    Or  acetaminophen (TYLENOL) 160 MG/5ML solution 650 mg ( Per Tube See Alternative 02/12/23 1728)    Or  acetaminophen (TYLENOL) suppository 650 mg ( Rectal See Alternative 02/12/23 1728)  heparin injection 5,000 Units (5,000 Units Subcutaneous Given 02/12/23 1733)  fluticasone (FLONASE) 50 MCG/ACT nasal spray 2 spray (has no administration in time range)  empagliflozin (JARDIANCE) tablet 10 mg (has no administration in time range)  hydrALAZINE (APRESOLINE) injection 10 mg (has no administration in time range)  clopidogrel (PLAVIX) tablet  75 mg (75 mg Oral Given 02/12/23 1532)    Mobility walks with person assist     Focused Assessments Cardiac Assessment Handoff:  Cardiac Rhythm: Sinus bradycardia No results found for: "CKTOTAL", "CKMB", "CKMBINDEX", "TROPONINI" No results found for: "DDIMER" Does the Patient currently have chest pain? No    R Recommendations: See Admitting Provider Note  Report given to:   Additional Notes:

## 2023-02-13 ENCOUNTER — Inpatient Hospital Stay (HOSPITAL_BASED_OUTPATIENT_CLINIC_OR_DEPARTMENT_OTHER): Payer: Medicare PPO

## 2023-02-13 ENCOUNTER — Inpatient Hospital Stay (HOSPITAL_COMMUNITY): Payer: Medicare PPO

## 2023-02-13 DIAGNOSIS — I63331 Cerebral infarction due to thrombosis of right posterior cerebral artery: Secondary | ICD-10-CM | POA: Diagnosis not present

## 2023-02-13 DIAGNOSIS — I63311 Cerebral infarction due to thrombosis of right middle cerebral artery: Secondary | ICD-10-CM | POA: Diagnosis not present

## 2023-02-13 DIAGNOSIS — I639 Cerebral infarction, unspecified: Secondary | ICD-10-CM | POA: Diagnosis present

## 2023-02-13 DIAGNOSIS — I6389 Other cerebral infarction: Secondary | ICD-10-CM | POA: Diagnosis not present

## 2023-02-13 HISTORY — DX: Cerebral infarction, unspecified: I63.9

## 2023-02-13 LAB — ECHOCARDIOGRAM COMPLETE
AR max vel: 2.13 cm2
AV Area VTI: 2.48 cm2
AV Area mean vel: 2.25 cm2
AV Mean grad: 3 mm[Hg]
AV Peak grad: 6.1 mm[Hg]
Ao pk vel: 1.23 m/s
Area-P 1/2: 1.83 cm2
Calc EF: 70.3 %
Height: 64 in
MV VTI: 1.64 cm2
S' Lateral: 2.8 cm
Single Plane A2C EF: 68.9 %
Single Plane A4C EF: 74.8 %
Weight: 1975.32 [oz_av]

## 2023-02-13 LAB — HEMOGLOBIN A1C
Hgb A1c MFr Bld: 6.6 % — ABNORMAL HIGH (ref 4.8–5.6)
Mean Plasma Glucose: 143 mg/dL

## 2023-02-13 LAB — GLUCOSE, CAPILLARY: Glucose-Capillary: 126 mg/dL — ABNORMAL HIGH (ref 70–99)

## 2023-02-13 MED ORDER — ROSUVASTATIN CALCIUM 5 MG PO TABS
10.0000 mg | ORAL_TABLET | Freq: Every day | ORAL | Status: DC
  Administered 2023-02-13 – 2023-02-16 (×4): 10 mg via ORAL
  Filled 2023-02-13 (×4): qty 2

## 2023-02-13 MED ORDER — CARVEDILOL 12.5 MG PO TABS
12.5000 mg | ORAL_TABLET | Freq: Two times a day (BID) | ORAL | Status: DC
  Administered 2023-02-13 – 2023-02-15 (×4): 12.5 mg via ORAL
  Filled 2023-02-13 (×6): qty 1

## 2023-02-13 MED ORDER — SALINE SPRAY 0.65 % NA SOLN
1.0000 | NASAL | Status: DC | PRN
  Filled 2023-02-13: qty 44

## 2023-02-13 MED ORDER — PANTOPRAZOLE SODIUM 40 MG PO TBEC
40.0000 mg | DELAYED_RELEASE_TABLET | Freq: Every day | ORAL | Status: DC
Start: 2023-02-13 — End: 2023-02-16
  Administered 2023-02-13 – 2023-02-16 (×4): 40 mg via ORAL
  Filled 2023-02-13 (×4): qty 1

## 2023-02-13 MED ORDER — CLOPIDOGREL BISULFATE 75 MG PO TABS
75.0000 mg | ORAL_TABLET | Freq: Every day | ORAL | Status: DC
  Administered 2023-02-13 – 2023-02-16 (×4): 75 mg via ORAL
  Filled 2023-02-13 (×4): qty 1

## 2023-02-13 MED ORDER — AMLODIPINE BESYLATE 5 MG PO TABS
5.0000 mg | ORAL_TABLET | Freq: Every day | ORAL | Status: DC
  Administered 2023-02-13: 5 mg via ORAL
  Filled 2023-02-13: qty 1

## 2023-02-13 NOTE — Progress Notes (Signed)
  Echocardiogram 2D Echocardiogram has been performed.  Ocie Doyne RDCS 02/13/2023, 12:10 PM

## 2023-02-13 NOTE — Evaluation (Signed)
Occupational Therapy Evaluation Patient Details Name: [name] MRN: [id number] DOB: 07/31/1927 Today's Date: 02/13/2023   History of Present Illness [name] is a 89 y.o. female presents with sudden bilateral loss of sensation in the hands and legs, leading to a fall. The occurred when attempting to transfer to her wheelchair at around 3 pm to open the door for a family friend who came over. CT scan of the head did not note any acute abnormality.  Subsequent MRI of the brain significant for 11 mm acute infarct at the right thalami with capsular junction. Past medical history significant of hypertension, CAD s/p CABG, CVA, diabetes mellitus type 2, rheumatoid arthritis.   Clinical Impression   Pt admitted for above, at baseline she pivots into her w/c but on IE pt presenting as generally weak and needs min A for close squat pivots. Although pt has aides on/off during the day she reports they are not as always reliable, pt noted to have impaired sensation throughout extremities and needs significant assist for ADLs. Patient would benefit from post acute skilled rehab facility with <3 hours of therapy and 24/7 support. OT to continue following pt acutely to address listed deficits and help transition to next level of care.        If plan is discharge home, recommend the following: A little help with walking and/or transfers;A lot of help with bathing/dressing/bathroom;Assistance with cooking/housework;Help with stairs or ramp for entrance    Functional Status Assessment  Patient has had a recent decline in their functional status and demonstrates the ability to make significant improvements in function in a reasonable and predictable amount of time.  Equipment Recommendations  None recommended by OT (TBD at next level of care)    Recommendations for Other Services       Precautions / Restrictions Precautions Precautions: Fall Restrictions Weight Bearing Restrictions Per  Provider Order: No      Mobility Bed Mobility Overal bed mobility: Needs Assistance Bed Mobility: Supine to Sit     Supine to sit: Contact guard, HOB elevated, Used rails     General bed mobility comments: Increased time    Transfers Overall transfer level: Needs assistance Equipment used: None Transfers: Bed to chair/wheelchair/BSC     Squat pivot transfers: Min assist       General transfer comment: Pt able to transfer to recliner with Min A for safety. Cues for hand placement. Pt feels she is not at baseline when it comes to transferring due to sensation changes.      Balance Overall balance assessment: Needs assistance Sitting-balance support: Bilateral upper extremity supported, Feet supported Sitting balance-[name]: Fair                                     ADL either performed or assessed with clinical judgement   ADL Overall ADL's : Needs assistance/impaired Eating/Feeding: Set up;Sitting   Grooming: Sitting;Set up   Upper Body Bathing: Sitting;Minimal assistance   Lower Body Bathing: Sitting/lateral leans;Moderate assistance   Upper Body Dressing : Sitting;Moderate assistance   Lower Body Dressing: Sitting/lateral leans;Total assistance Lower Body Dressing Details (indicate cue type and reason): total A to don socks Toilet Transfer: Minimal assistance;Squat-pivot Toilet Transfer Details (indicate cue type and reason): simulaed with recliner, be very close Toileting- Clothing Manipulation and Hygiene: Maximal assistance;Sit to/from stand         General ADL Comments: Pt pivots to w/c  at baseline,     Vision         Perception         Praxis         Pertinent Vitals/Pain Pain Assessment Pain Assessment: No/denies pain     Extremity/Trunk Assessment Upper Extremity Assessment Upper Extremity Assessment: Generalized weakness (decreased sensation of BUEs with numbness of digits, R side more affected than L. Limited  shoulder mobility in BUEs with L side weaker and less ROM than R)   Lower Extremity Assessment Lower Extremity Assessment: Generalized weakness (has personal care attendant. Impaired 2 point discrimination)   Cervical / Trunk Assessment Cervical / Trunk Assessment: Kyphotic   Communication Communication Communication: No apparent difficulties Cueing Techniques: Verbal cues;Tactile cues   Cognition Arousal: Alert Behavior During Therapy: WFL for tasks assessed/performed Overall Cognitive Status: Within Functional Limits for tasks assessed                                 General Comments: Pt very pleasant.     General Comments  VSS    Exercises     Shoulder Instructions      Home Living Family/patient expects to be discharged to:: Private residence Living Arrangements: Alone Available Help at Discharge: Available PRN/intermittently;Personal care attendant (Pt reports PCAs have been inconsistent and unreliable in terms of scheduling. One comes Tues and Thurs 5pm-8pm and another 10-2) Type of Home: House Home Access: Stairs to enter;Ramped entrance Entrance Stairs-Number of Steps: ramp out back   Home Layout: Two level;Able to live on main level with bedroom/bathroom Alternate Level Stairs-Number of Steps: does not go to second level   Bathroom Shower/Tub: Producer, television/film/video: Standard Bathroom Accessibility: Yes How Accessible: Accessible via wheelchair Home Equipment: Wheelchair - Forensic psychologist (2 wheels);Rollator (4 wheels);Cane - single point;Cane - quad;Shower seat;BSC/3in1   Additional Comments: Gets meals on wheels M-F  Lives With: Alone;Other (Comment) (has personal care attendant)    Prior Functioning/Environment Prior Level of Function : Needs assist             Mobility Comments: Pt mainly wheelchair level pivots to wheelchair with ext support ADLs Comments: Aides assisting with bathing and dressing. Pt reports they  don't do so as much as they should        OT Problem List: Impaired sensation;Impaired balance (sitting and/or standing);Decreased strength;Decreased range of motion;Impaired UE functional use      OT Treatment/Interventions: Self-care/ADL training;Therapeutic exercise;Patient/family education;Balance training;Therapeutic activities;DME and/or AE instruction    OT Goals(Current goals can be found in the care plan section) Acute Rehab OT Goals Patient Stated Goal: "To move around reasonably" OT Goal Formulation: With patient Time For Goal Achievement: 02/27/23 Potential to Achieve Goals: Good ADL Goals Pt Will Perform Lower Body Bathing: sitting/lateral leans;with set-up Pt Will Transfer to Toilet: stand pivot transfer;with min assist;bedside commode Pt Will Perform Toileting - Clothing Manipulation and hygiene: sitting/lateral leans;with contact guard assist Pt/caregiver will Perform Home Exercise Program: Increased strength;Both right and left upper extremity;With written HEP provided;Independently;With theraband  OT Frequency: Min 1X/week    Co-evaluation       OT goals addressed during session: ADL's and self-care;Proper use of Adaptive equipment and DME      AM-PAC OT "6 Clicks" Daily Activity     Outcome Measure Help from another person eating meals?: A Little Help from another person taking care of personal grooming?: A Little Help from another person  toileting, which includes using toliet, bedpan, or urinal?: A Lot Help from another person bathing (including washing, rinsing, drying)?: A Lot Help from another person to put on and taking off regular upper body clothing?: A Lot Help from another person to put on and taking off regular lower body clothing?: Total 6 Click Score: 13   End of Session Equipment Utilized During Treatment: Gait belt Nurse Communication: Mobility status  Activity Tolerance: Patient tolerated treatment well Patient left: in chair;with call  bell/phone within reach;with chair alarm set  OT Visit Diagnosis: Unsteadiness on feet (R26.81);History of falling (Z91.81);Other abnormalities of gait and mobility (R26.89);Muscle weakness (generalized) (M62.81);Other symptoms and signs involving the nervous system (R29.898)                Time: 1351-1420 OT Time Calculation (min): 29 min Charges:  OT General Charges $OT Visit: 1 Visit OT Evaluation $OT Eval Moderate Complexity: 1 Mod  02/13/2023  AB, OTR/L  Acute Rehabilitation Services  Office: [phone]   Tristan Schroeder 02/13/2023, 4:59 PM

## 2023-02-13 NOTE — Evaluation (Signed)
Physical Therapy Evaluation Patient Details Name: [name] MRN: [id number] DOB: 1927/10/18 Today's Date: 02/13/2023  History of Present Illness  [name] is a 89 y.o. female presents with sudden bilateral loss of sensation in the hands and legs, leading to a fall. The occurred when attempting to transfer to her wheelchair at around 3 pm to open the door for a family friend who came over. CT scan of the head did not note any acute abnormality.  Subsequent MRI of the brain significant for 11 mm acute infarct at the right thalami with capsular junction. Past medical history significant of hypertension, CAD s/p CABG, CVA, diabetes mellitus type 2, rheumatoid arthritis.  Clinical Impression  Pt presents with admitting diagnosis above. Co-treat with OT. Pt was extremely pleasant and able to transfer to chair via squat pivot Min A. PTA pt reports she lives alone and mostly WC level. Pt states that she has not been able to ambulate in some time due to sciatica and spinal stenosis. Pt also reports that she has some caregivers that come intermittently however they have not been reliable in the past. Given that pt is mostly alone during the day and does not feel confident with transfer currently, patient will benefit from continued inpatient follow up therapy, <3 hours/day. PT will continue to follow.          If plan is discharge home, recommend the following: A lot of help with walking and/or transfers;A little help with bathing/dressing/bathroom;Assistance with cooking/housework;Assist for transportation;Help with stairs or ramp for entrance   Can travel by private vehicle   No    Equipment Recommendations Other (comment) (Per accepting facility)  Recommendations for Other Services       Functional Status Assessment Patient has had a recent decline in their functional status and demonstrates the ability to make significant improvements in function in a reasonable and predictable amount  of time.     Precautions / Restrictions Precautions Precautions: Fall Restrictions Weight Bearing Restrictions Per Provider Order: No      Mobility  Bed Mobility Overal bed mobility: Needs Assistance Bed Mobility: Supine to Sit     Supine to sit: Contact guard, HOB elevated, Used rails     General bed mobility comments: Increased time    Transfers Overall transfer level: Needs assistance Equipment used: None Transfers: Bed to chair/wheelchair/BSC       Squat pivot transfers: Min assist     General transfer comment: Pt able to transfer to recliner with Min A for safety. Cues for hand placement. Pt feels she is not at baseline when it comes to transferring due to sensation changes.    Ambulation/Gait               General Gait Details: Unable  Stairs            Wheelchair Mobility     Tilt Bed    Modified Rankin (Stroke Patients Only)       Balance Overall balance assessment: Needs assistance Sitting-balance support: Bilateral upper extremity supported, Feet supported Sitting balance-[name]: Fair                                       Pertinent Vitals/Pain Pain Assessment Pain Assessment: No/denies pain    Home Living Family/patient expects to be discharged to:: Private residence Living Arrangements: Alone Available Help at Discharge: Available PRN/intermittently;Personal care attendant (Pt reports PCAs have  been inconsistent and unreliable in terms of scheduling. One comes Tues and Thurs 5pm-8pm and another 10-2) Type of Home: House Home Access: Stairs to enter;Ramped entrance   Entrance Stairs-Number of Steps: ramp out back Alternate Level Stairs-Number of Steps: does not go to second level Home Layout: Two level;Able to live on main level with bedroom/bathroom Home Equipment: Wheelchair - Forensic psychologist (2 wheels);Rollator (4 wheels);Cane - single point;Cane - quad;Shower seat;BSC/3in1 Additional Comments:  Gets meals on wheels M-F    Prior Function Prior Level of Function : Needs assist             Mobility Comments: Pt mainly wheelchair level pivots to wheelchair with ext support ADLs Comments: Aides assisting with bathing and dressing. Pt reports they don't do so as much as they should     Extremity/Trunk Assessment   Upper Extremity Assessment Upper Extremity Assessment: Generalized weakness (Impaired sensation RUE)    Lower Extremity Assessment Lower Extremity Assessment: Generalized weakness (Impaired sensation below knees bilaterally.)    Cervical / Trunk Assessment Cervical / Trunk Assessment: Kyphotic  Communication   Communication Communication: No apparent difficulties Cueing Techniques: Verbal cues;Tactile cues  Cognition Arousal: Alert Behavior During Therapy: WFL for tasks assessed/performed Overall Cognitive Status: Within Functional Limits for tasks assessed                                 General Comments: Pt very pleasant.        General Comments General comments (skin integrity, edema, etc.): VSS on RA    Exercises     Assessment/Plan    PT Assessment Patient needs continued PT services  PT Problem List Decreased range of motion;Decreased strength;Decreased activity tolerance;Decreased balance;Decreased mobility;Decreased coordination;Decreased knowledge of use of DME;Decreased safety awareness;Decreased knowledge of precautions;Cardiopulmonary status limiting activity       PT Treatment Interventions DME instruction;Gait training;Stair training;Functional mobility training;Therapeutic activities;Therapeutic exercise;Balance training;Neuromuscular re-education;Patient/family education    PT Goals (Current goals can be found in the Care Plan section)  Acute Rehab PT Goals Patient Stated Goal: to get better PT Goal Formulation: With patient Time For Goal Achievement: 02/27/23 Potential to Achieve Goals: Good    Frequency Min  1X/week     Co-evaluation               AM-PAC PT "6 Clicks" Mobility  Outcome Measure Help needed turning from your back to your side while in a flat bed without using bedrails?: A Little Help needed moving from lying on your back to sitting on the side of a flat bed without using bedrails?: A Little Help needed moving to and from a bed to a chair (including a wheelchair)?: A Little Help needed standing up from a chair using your arms (e.g., wheelchair or bedside chair)?: A Lot Help needed to walk in hospital room?: Total Help needed climbing 3-5 steps with a railing? : Total 6 Click Score: 13    End of Session Equipment Utilized During Treatment: Gait belt Activity Tolerance: Patient tolerated treatment well Patient left: in chair;with call bell/phone within reach;with chair alarm set Nurse Communication: Mobility status PT Visit Diagnosis: Other abnormalities of gait and mobility (R26.89)    Time: 4098-1191 PT Time Calculation (min) (ACUTE ONLY): 30 min   Charges:   PT Evaluation $PT Eval Moderate Complexity: 1 Mod   PT General Charges $$ ACUTE PT VISIT: 1 Visit         Daine Gunther B,  PT, DPT Acute Rehab Services [phone]   Gladys Damme 02/13/2023, 3:50 PM

## 2023-02-13 NOTE — Plan of Care (Signed)

## 2023-02-13 NOTE — Progress Notes (Signed)
PT Cancellation Note  Patient Details Name: [name] MRN: [id number] DOB: 1927/11/17   Cancelled Treatment:    Reason Eval/Treat Not Completed: Patient at procedure or test/unavailable (Pt off the floor at MRI. Will follow up if time allows.)   Gladys Damme 02/13/2023, 9:48 AM

## 2023-02-13 NOTE — Care Management CC44 (Signed)
Condition Code 44 Documentation Completed  Patient Details  Name: Peggi Chirillo MRN: [id number] Date of Birth: [birth year]   Condition Code 44 given:  Yes Patient signature on Condition Code 44 notice:  Yes Documentation of 2 MD's agreement:  Yes Code 44 added to claim:  Yes    Gordy Clement, RN 02/13/2023, 3:55 PM

## 2023-02-13 NOTE — NC FL2 (Signed)
[place] MEDICAID FL2 LEVEL OF CARE FORM     IDENTIFICATION  Patient Name: [name] Birthdate: 1927-06-16 Sex: female Admission Date (Current Location): 02/12/2023  [facility] and [place] Number:  Producer, television/film/video and Address:  [facility], [address] [place], [place] [address]      Provider Number: [id number]  Attending Physician Name and Address:  Elgergawy, Leana Roe, MD  Relative Name and Phone Number:       Current Level of Care: Hospital Recommended Level of Care: Skilled Nursing Facility Prior Approval Number:    Date Approved/Denied:   PASRR Number: [id number]  Discharge Plan: SNF    Current Diagnoses: Patient Active Problem List   Diagnosis Date Noted   Acute CVA (cerebrovascular accident) (HCC) 02/13/2023   Anemia of chronic disease 02/12/2023   CVA (cerebral vascular accident) (HCC) 08/07/2022   History of CAD (coronary artery disease) of artery bypass graft 08/07/2022   Arthritis of right hip 08/06/2021   Unilateral primary osteoarthritis, right knee 08/06/2021   Closed compression fracture of body of L1 vertebra (HCC) 07/05/2021   Peripheral arterial disease (HCC) 03/17/2021   Onychomycosis 02/28/2021   Right leg pain 07/26/2020   Onychodystrophy 07/26/2020   CKD (chronic kidney disease), stage IV (HCC) 07/19/2020   Hypertensive heart and renal disease 03/22/2020   Gout 12/07/2017   DM type 2 (diabetes mellitus, type 2) (HCC) 02/11/2014   History of coronary artery bypass graft 07/01/2010   MYOCARDIAL INFARCTION, INFERIOR WALL, SUBSEQUENT CARE 05/12/2008   CAD, NATIVE VESSEL 05/12/2008   Hyperlipidemia 05/11/2008   Essential hypertension 05/11/2008   CAD, UNSPECIFIED SITE 05/11/2008   CEREBROVASCULAR DISEASE 05/11/2008   Rheumatoid arthritis (HCC) 05/11/2008    Orientation RESPIRATION BLADDER Height & Weight     Self, Time, Situation, Place  Normal Incontinent Weight: 123 lb 7.3 oz (56 kg) Height:  5\' 4"   (162.6 cm)  BEHAVIORAL SYMPTOMS/MOOD NEUROLOGICAL BOWEL NUTRITION STATUS      Continent Diet (See dc summary)  AMBULATORY STATUS COMMUNICATION OF NEEDS Skin   Limited Assist Verbally Normal                       Personal Care Assistance Level of Assistance  Bathing, Feeding, Dressing Bathing Assistance: Limited assistance Feeding assistance: Limited assistance Dressing Assistance: Limited assistance     Functional Limitations Info             SPECIAL CARE FACTORS FREQUENCY  PT (By licensed PT), OT (By licensed OT), Speech therapy     PT Frequency: 5x/week OT Frequency: 5x/week     Speech Therapy Frequency: eval and treat      Contractures Contractures Info: Not present    Additional Factors Info  Code Status, Allergies Code Status Info: DNR Allergies Info: NKA           Current Medications (02/13/2023):  This is the current hospital active medication list Current Facility-Administered Medications  Medication Dose Route Frequency Provider Last Rate Last Admin   0.9 %  sodium chloride infusion   Intravenous Continuous Madelyn Flavors A, MD 40 mL/hr at 02/12/23 1734 New Bag at 02/12/23 1734   acetaminophen (TYLENOL) tablet 650 mg  650 mg Oral Q4H PRN Madelyn Flavors A, MD   650 mg at 02/13/23 5621   Or   acetaminophen (TYLENOL) 160 MG/5ML solution 650 mg  650 mg Per Tube Q4H PRN Clydie Braun, MD       Or  acetaminophen (TYLENOL) suppository 650 mg  650 mg Rectal Q4H PRN Katrinka Blazing, Rondell A, MD       amLODipine (NORVASC) tablet 5 mg  5 mg Oral Daily Marvel Plan, MD       aspirin EC tablet 81 mg  81 mg Oral Daily Smith, Rondell A, MD   81 mg at 02/13/23 0833   carvedilol (COREG) tablet 12.5 mg  12.5 mg Oral BID WC Marvel Plan, MD       clopidogrel (PLAVIX) tablet 75 mg  75 mg Oral Daily Marvel Plan, MD   75 mg at 02/13/23 4742   empagliflozin (JARDIANCE) tablet 10 mg  10 mg Oral Daily Smith, Rondell A, MD   10 mg at 02/13/23 0833   fluticasone (FLONASE) 50  MCG/ACT nasal spray 2 spray  2 spray Each Nare Daily PRN Madelyn Flavors A, MD       heparin injection 5,000 Units  5,000 Units Subcutaneous Q8H Smith, Rondell A, MD   5,000 Units at 02/13/23 1328   hydrALAZINE (APRESOLINE) injection 10 mg  10 mg Intravenous Q4H PRN Smith, Rondell A, MD       pantoprazole (PROTONIX) EC tablet 40 mg  40 mg Oral Daily Elgergawy, Leana Roe, MD   40 mg at 02/13/23 1204   rosuvastatin (CRESTOR) tablet 10 mg  10 mg Oral Daily Marvel Plan, MD   10 mg at 02/13/23 5956   sodium chloride (OCEAN) 0.65 % nasal spray 1 spray  1 spray Each Nare PRN Clydie Braun, MD         Discharge Medications: Please see discharge summary for a list of discharge medications.  Relevant Imaging Results:  Relevant Lab Results:   Additional Information SSN: [id number]  Mearl Latin, LCSW

## 2023-02-13 NOTE — Progress Notes (Addendum)
STROKE TEAM PROGRESS NOTE   BRIEF HPI Ms. [name] is a 89 y.o. female with history of CVA, DM2, HLD, rheumatoid arthritis, shingles, essential HTN presenting with generalized weakness and numbness with weakness causing her legs to give out with a subsequent patient fall on 12/25. Patient seems to have given variable reports regarding generalized weakness versus left-sided weakness to EDP/neurology/attending providers.   SIGNIFICANT HOSPITAL EVENTS 12/26:  [facility] with no acute intracranial abnormality with advanced atrophy and white matter disease reflecting sequela of chronic microvascular ischemia and remote lacunar infarctions of the basal ganglia and corona radiata.  -MRI brain with 11 mm acute infarct in the right thalamocapsular junction with background parenchymal atrophy, advanced chronic small vessel ischemic disease and chronic lacunar infarcts. -MRI cervical spine with spondylolysis at the cervical and visualized upper thoracic levels with multilevel foraminal stenosis. Most notably severe foraminal stenosis on the right at C3-C4 on the right at C4-C5 bilaterally at C6-C7 and bilaterally at C7-T1.  INTERIM HISTORY/SUBJECTIVE Patient reports no left-sided weakness but continues to endorse generalized weakness on exam today without lateralization.  Patient does report "different" sensation to light touch between the left and right hemibody but is unable to specify the sensory changes.   OBJECTIVE CBC    Component Value Date/Time   WBC 7.9 02/12/2023 1124   RBC 3.79 (L) 02/12/2023 1124   HGB 12.2 02/12/2023 1159   HGB 10.8 (L) 11/14/2020 1524   HCT 36.0 02/12/2023 1159   HCT 32.8 (L) 11/14/2020 1524   PLT 219 02/12/2023 1124   PLT 206 11/14/2020 1524   MCV 97.9 02/12/2023 1124   MCV 97 11/14/2020 1524   MCH 31.1 02/12/2023 1124   MCHC 31.8 02/12/2023 1124   RDW 14.2 02/12/2023 1124   RDW 13.1 11/14/2020 1524   LYMPHSABS 1.1 02/12/2023 1124   MONOABS 0.7 02/12/2023 1124    EOSABS 0.1 02/12/2023 1124   BASOSABS 0.0 02/12/2023 1124   BMET    Component Value Date/Time   NA 141 02/12/2023 1159   NA 143 07/17/2021 1549   K 4.1 02/12/2023 1159   CL 106 02/12/2023 1159   CO2 23 02/12/2023 1124   GLUCOSE 159 (H) 02/12/2023 1159   BUN 55 (H) 02/12/2023 1159   BUN 51 (H) 07/17/2021 1549   CREATININE 2.20 (H) 02/12/2023 1159   CALCIUM 9.0 02/12/2023 1124   EGFR 20.0 05/15/2022 1628   EGFR 18 (L) 07/17/2021 1549   GFRNONAA 21 (L) 02/12/2023 1124   Lab Results  Component Value Date   HGBA1C 6.4 (H) 08/07/2022   Lab Results  Component Value Date   CHOL 149 02/12/2023   HDL 33 (L) 02/12/2023   LDLCALC 95 02/12/2023   LDLDIRECT 88 08/07/2022   TRIG 103 02/12/2023   CHOLHDL 4.5 02/12/2023   IMAGING past 24 hours MR BRAIN WO CONTRAST Result Date: 02/12/2023 CLINICAL DATA:  Provided history: Neuro deficit, acute, stroke suspected. Additional history provided: Recent fall. Increased weakness/numbness. Paresthesias and weakness on left side. EXAM: MRI HEAD WITHOUT CONTRAST MRI CERVICAL SPINE WITHOUT CONTRAST TECHNIQUE: Multiplanar, multiecho pulse sequences of the brain and surrounding structures, and cervical spine, to include the craniocervical junction and cervicothoracic junction, were obtained without intravenous contrast. COMPARISON:  Head CT 02/12/2023. Brain MRI 08/06/2022. Cervical spine MRI 08/06/2022. FINDINGS: MRI HEAD FINDINGS Brain: Generalized cerebral atrophy. 11 mm acute infarct at the right thalamocapsular junction. Chronic lacunar infarcts again noted within the bilateral cerebral hemispheric white matter and within/about the bilateral deep gray nuclei. Background  advanced multifocal T2 FLAIR hyperintense signal abnormality within the cerebral white matter, nonspecific but compatible with chronic small vessel ischemic disease. Minimal chronic small vessel ischemic changes also noted within the pons. No evidence of an intracranial mass. No  extra-axial fluid collection. No midline shift. Vascular: Maintained flow voids within the proximal large arterial vessels. Skull and upper cervical spine: No focal worrisome marrow lesion. Sinuses/Orbits: No mass or acute finding within the imaged orbits. Prior bilateral ocular lens replacement. No significant paranasal sinus disease. MRI CERVICAL SPINE FINDINGS The axial T2 sequences are moderately motion degraded. Within this limitation, findings are as follows. Alignment: Grade 1 anterolisthesis at C3-C4, C4-C5, C7-T1, T1-T2 and T2-T3. Vertebrae: Cervical vertebral body height is maintained. Vertebral body and left-sided facet ankylosis at C5-C6. Moderate to prominent marrow edema at C6-C7, progressed from the prior cervical spine MRI of 08/06/2022 and likely degenerative. Degenerative marrow edema also again noted at the C1-C2 level. Cord: Within limitations of motion degradation, no signal abnormality is identified within the cervical spinal cord. Posterior Fossa, vertebral arteries, paraspinal tissues: Posterior fossa assessed on concurrently performed brain MRI. Flow voids preserved within the imaged cervical vertebral arteries. No paraspinal mass or collection. Disc levels: Unless otherwise stated, the level by level findings below have not significantly changed from the prior MRI of 08/06/2022. Multilevel disc degeneration. At the non-fused levels, disc degeneration is greatest at C3-C4, C4-C5 and C7-T1 (advanced at these levels). C1-C2: Ligamentous hypertrophy posterior to the dens. Ligamentum flavum thickening. Mild-to-moderate spinal canal stenosis with mild spinal cord flattening. C2-C3: Posterior disc osteophyte complex with right greater than left disc osteophyte ridge/uncinate hypertrophy. Facet arthropathy (greater on the left). Ligamentum flavum thickening. Mild spinal canal narrowing. Moderate right neural foraminal narrowing. C3-C4: Grade 1 anterolisthesis. Disc uncovering. Posterior disc  osteophyte complex with right-sided disc osteophyte ridge/uncinate hypertrophy. Facet arthropathy (greater on the left). Ligamentum flavum thickening. Mild-to-moderate spinal canal stenosis. Bilateral neural foraminal narrowing (severe right, moderate left). C4-C5: Grade 1 anterolisthesis. Disc uncovering. Posterior disc osteophyte complex with bilateral disc osteophyte ridge/uncinate hypertrophy. Facet arthropathy. Ligamentum flavum thickening. Mild-to-moderate spinal canal stenosis. Bilateral neural foraminal narrowing (severe right, moderate left). C5-C6: Vertebral ankylosis. Vertebral osteophytosis. Bilateral uncovertebral hypertrophy. No significant spinal canal stenosis. Bilateral neural foraminal narrowing (moderate right, mild left). C6-C7: Posterior disc osteophyte complex with bilateral disc osteophyte ridge/uncinate hypertrophy. Facet arthropathy (greater on the right). Ligamentum flavum thickening. Mild-to-moderate spinal canal stenosis. Severe bilateral neural foraminal narrowing. C7-T1: Grade 1 anterolisthesis. Disc uncovering with disc bulge. Facet arthropathy (greater on the left). Mild spinal canal stenosis. Severe bilateral neural foraminal narrowing. T1-T2: Grade 1 anterolisthesis. Disc uncovering with disc bulge. Facet arthropathy. Ligamentum flavum thickening. Mild spinal canal narrowing. Bilateral neural foraminal narrowing (moderate right, mild left). IMPRESSION: MRI brain: 1. 11 mm acute infarct at the right thalamocapsular junction. 2. Background parenchymal atrophy, advanced chronic small vessel ischemic disease and chronic lacunar infarcts as described. MRI cervical spine: 1. Motion degraded exam. 2. Spondylosis at the cervical and visualized upper thoracic levels as outlined within the body of the report. Moderate-to-prominent marrow edema within the C6 and C7 vertebral bodies, likely degenerative and progressed from the prior MRI of 08/06/2022. Cervical spondylosis has otherwise not  significantly changed from this prior study. 3. Spinal canal stenosis is greatest at C1-C2, C3-C4, C4-C5 and C6-C7 (mild-to-moderate at these levels). 4. Multilevel foraminal stenosis. Most notably, severe foraminal stenosis is present on the right at C3-C4, on the right at C4-C5, bilaterally at C6-C7 and bilaterally at C7-T1. 5. Disc degeneration  is advanced at C3-C4, C4-C5 and C7-T1. 6. C5-C6 vertebral ankylosis. Electronically Signed   By: Jackey Loge D.O.   On: 02/12/2023 15:30   MR Cervical Spine Wo Contrast Result Date: 02/12/2023 CLINICAL DATA:  Provided history: Neuro deficit, acute, stroke suspected. Additional history provided: Recent fall. Increased weakness/numbness. Paresthesias and weakness on left side. EXAM: MRI HEAD WITHOUT CONTRAST MRI CERVICAL SPINE WITHOUT CONTRAST TECHNIQUE: Multiplanar, multiecho pulse sequences of the brain and surrounding structures, and cervical spine, to include the craniocervical junction and cervicothoracic junction, were obtained without intravenous contrast. COMPARISON:  Head CT 02/12/2023. Brain MRI 08/06/2022. Cervical spine MRI 08/06/2022. FINDINGS: MRI HEAD FINDINGS Brain: Generalized cerebral atrophy. 11 mm acute infarct at the right thalamocapsular junction. Chronic lacunar infarcts again noted within the bilateral cerebral hemispheric white matter and within/about the bilateral deep gray nuclei. Background advanced multifocal T2 FLAIR hyperintense signal abnormality within the cerebral white matter, nonspecific but compatible with chronic small vessel ischemic disease. Minimal chronic small vessel ischemic changes also noted within the pons. No evidence of an intracranial mass. No extra-axial fluid collection. No midline shift. Vascular: Maintained flow voids within the proximal large arterial vessels. Skull and upper cervical spine: No focal worrisome marrow lesion. Sinuses/Orbits: No mass or acute finding within the imaged orbits. Prior bilateral ocular  lens replacement. No significant paranasal sinus disease. MRI CERVICAL SPINE FINDINGS The axial T2 sequences are moderately motion degraded. Within this limitation, findings are as follows. Alignment: Grade 1 anterolisthesis at C3-C4, C4-C5, C7-T1, T1-T2 and T2-T3. Vertebrae: Cervical vertebral body height is maintained. Vertebral body and left-sided facet ankylosis at C5-C6. Moderate to prominent marrow edema at C6-C7, progressed from the prior cervical spine MRI of 08/06/2022 and likely degenerative. Degenerative marrow edema also again noted at the C1-C2 level. Cord: Within limitations of motion degradation, no signal abnormality is identified within the cervical spinal cord. Posterior Fossa, vertebral arteries, paraspinal tissues: Posterior fossa assessed on concurrently performed brain MRI. Flow voids preserved within the imaged cervical vertebral arteries. No paraspinal mass or collection. Disc levels: Unless otherwise stated, the level by level findings below have not significantly changed from the prior MRI of 08/06/2022. Multilevel disc degeneration. At the non-fused levels, disc degeneration is greatest at C3-C4, C4-C5 and C7-T1 (advanced at these levels). C1-C2: Ligamentous hypertrophy posterior to the dens. Ligamentum flavum thickening. Mild-to-moderate spinal canal stenosis with mild spinal cord flattening. C2-C3: Posterior disc osteophyte complex with right greater than left disc osteophyte ridge/uncinate hypertrophy. Facet arthropathy (greater on the left). Ligamentum flavum thickening. Mild spinal canal narrowing. Moderate right neural foraminal narrowing. C3-C4: Grade 1 anterolisthesis. Disc uncovering. Posterior disc osteophyte complex with right-sided disc osteophyte ridge/uncinate hypertrophy. Facet arthropathy (greater on the left). Ligamentum flavum thickening. Mild-to-moderate spinal canal stenosis. Bilateral neural foraminal narrowing (severe right, moderate left). C4-C5: Grade 1  anterolisthesis. Disc uncovering. Posterior disc osteophyte complex with bilateral disc osteophyte ridge/uncinate hypertrophy. Facet arthropathy. Ligamentum flavum thickening. Mild-to-moderate spinal canal stenosis. Bilateral neural foraminal narrowing (severe right, moderate left). C5-C6: Vertebral ankylosis. Vertebral osteophytosis. Bilateral uncovertebral hypertrophy. No significant spinal canal stenosis. Bilateral neural foraminal narrowing (moderate right, mild left). C6-C7: Posterior disc osteophyte complex with bilateral disc osteophyte ridge/uncinate hypertrophy. Facet arthropathy (greater on the right). Ligamentum flavum thickening. Mild-to-moderate spinal canal stenosis. Severe bilateral neural foraminal narrowing. C7-T1: Grade 1 anterolisthesis. Disc uncovering with disc bulge. Facet arthropathy (greater on the left). Mild spinal canal stenosis. Severe bilateral neural foraminal narrowing. T1-T2: Grade 1 anterolisthesis. Disc uncovering with disc bulge. Facet arthropathy. Ligamentum flavum thickening. Mild  spinal canal narrowing. Bilateral neural foraminal narrowing (moderate right, mild left). IMPRESSION: MRI brain: 1. 11 mm acute infarct at the right thalamocapsular junction. 2. Background parenchymal atrophy, advanced chronic small vessel ischemic disease and chronic lacunar infarcts as described. MRI cervical spine: 1. Motion degraded exam. 2. Spondylosis at the cervical and visualized upper thoracic levels as outlined within the body of the report. Moderate-to-prominent marrow edema within the C6 and C7 vertebral bodies, likely degenerative and progressed from the prior MRI of 08/06/2022. Cervical spondylosis has otherwise not significantly changed from this prior study. 3. Spinal canal stenosis is greatest at C1-C2, C3-C4, C4-C5 and C6-C7 (mild-to-moderate at these levels). 4. Multilevel foraminal stenosis. Most notably, severe foraminal stenosis is present on the right at C3-C4, on the right at  C4-C5, bilaterally at C6-C7 and bilaterally at C7-T1. 5. Disc degeneration is advanced at C3-C4, C4-C5 and C7-T1. 6. C5-C6 vertebral ankylosis. Electronically Signed   By: Jackey Loge D.O.   On: 02/12/2023 15:30   CT HEAD WO CONTRAST Result Date: 02/12/2023 CLINICAL DATA:  Neuro deficit, acute, stroke suspected. Fall. No visible injuries. EXAM: CT HEAD WITHOUT CONTRAST TECHNIQUE: Contiguous axial images were obtained from the base of the skull through the vertex without intravenous contrast. RADIATION DOSE REDUCTION: This exam was performed according to the departmental dose-optimization program which includes automated exposure control, adjustment of the mA and/or kV according to patient size and/or use of iterative reconstruction technique. COMPARISON:  CT head without contrast 08/06/2022. MR head without contrast 08/06/2022. FINDINGS: Brain: Advanced atrophy and white matter disease is similar the prior exam. Remote lacunar infarcts of the basal ganglia and corona radiata bilaterally are stable. No acute infarct, hemorrhage, or mass lesion is present. The ventricles are of normal size. No significant extraaxial fluid collection is present. The brainstem and cerebellum are within normal limits. Midline structures are within normal limits. Vascular: Atherosclerotic calcifications are present within the cavernous internal carotid arteries and at the dural margin of both vertebral arteries. Skull: Calvarium is intact. No focal lytic or blastic lesions are present. No significant extracranial soft tissue lesion is present. Sinuses/Orbits: The paranasal sinuses and mastoid air cells are clear. The globes and orbits are within normal limits. IMPRESSION: 1. No acute intracranial abnormality or significant interval change. 2. Advanced atrophy and white matter disease likely reflects the sequela of chronic microvascular ischemia. 3. Remote lacunar infarcts of the basal ganglia and corona radiata bilaterally are  stable. Electronically Signed   By: Marin Roberts M.D.   On: 02/12/2023 13:15   DG Chest Portable 1 View Result Date: 02/12/2023 CLINICAL DATA:  Left-sided weakness and numbness.  Fell yesterday. EXAM: PORTABLE CHEST 1 VIEW COMPARISON:  Chest x-ray dated June 11, 2014. FINDINGS: The patient is rotated to the left. Stable cardiomediastinal silhouette with normal heart size status post CABG. Normal pulmonary vascularity. No focal consolidation, pleural effusion, or pneumothorax. No acute osseous abnormality. IMPRESSION: No active disease. Electronically Signed   By: Obie Dredge M.D.   On: 02/12/2023 12:24   Vitals:   02/13/23 0555 02/13/23 0600 02/13/23 0725 02/13/23 0800  BP:   (!) 163/53 (!) 152/46  Pulse: 64 60 68 62  Resp: 16 (!) 0 17 17  Temp:    98.6 F (37 C)  TempSrc:    Oral  SpO2: 97% 98% 100% 99%  Weight:      Height:       PHYSICAL EXAM General:  Alert, well-nourished, well-developed elderly African American female in no acute distress  Psych:  Mood and affect appropriate for situation, patient is calm and cooperative with exam CV: Regular rate and rhythm on monitor Respiratory:  Regular, unlabored respirations on room air GI: Abdomen soft and nontender  NEURO:  Mental Status: AA&Ox3, patient is able to give clear and coherent history Speech/Language: speech is without dysarthria or aphasia.  Naming, repetition, fluency, and comprehension intact. She is able to follow simple commands.   Cranial Nerves:  II: PERRL. Visual fields full.  III, IV, VI: EOMI without gaze preference. V: Patient reports different sensation to the left and right face but is unable to specify or further elaborate VII: Mild left facial droop with drooling from the left with patient leaning leftward VIII: Hearing intact to voice. IX, X: Palate elevates symmetrically. Phonation intact.  XI: Head turns equally side-to-side XII: Tongue protrudes midline Motor: Left upper extremity chronic  shoulder injury with impaired strength testing 3/5 biceps and triceps, 3/5 grip.  Bilateral lower extremities 4/5 strength as patient is able to elevate and antigravity before tiring and drifting BLE slightly.  Tone: is normal and bulk is normal for age Sensation: Patient reports "different" sensation between left and right hemibody to light touch but is unable to elaborate.  Patient does have chronically decreased sensation of the right upper and bilateral lower extremities.  At one point, the patient does state that the right lower extremity feels less to light touch than the left lower extremity but this is inconsistent reporting.  Coordination: FTN intact though slowed on the left (chronic left shoulder pain), HKS: no ataxia in BLE. Gait: Deferred for patent safety with reports of recent falls   ASSESSMENT/PLAN  Acute Ischemic Infarct: 11 mm acute infarct in the right thalamocapsular junction Etiology: small vessel disease   CT head: No acute intracranial abnormality or significant interval change.  Remote lacunar infarcts of the basal ganglia and corona radiata bilaterally are stable. MRI: 11 mm acute infarct at the right thalamic capsular junction.   MRA Severe stenosis within a proximal left M2 2D Echo LVEF 60-65% LDL 95 HgbA1c 6.4 VTE prophylaxis -SQ heparin aspirin 81 mg daily prior to admission, now on aspirin 81 mg daily and clopidogrel 75 mg daily for 3 weeks and then clopidogrel alone. Therapy recommendations:  Pending Disposition:  pending  Hx of Stroke/TIA Patient stated that she had a stroke in 2003 resolved residual deficit Admitted 07/2022 for left hand weakness.  MRI and MRA negative.  Carotid Doppler negative.  EF 60 to 65%.  LDL 91, A1c 6.4.  Considered possible MRI negative infarct.  Discharged on DAPT and Crestor 20.    Cervical radiculopathy 07/2022 MRI cervical spine showed multilevel moderate to severe neural foraminal stenosis, worst at C6-7.  Mild spinal canal  stenosis at C3-4 This admission MR Cervical spine:  Moderate-to-prominent marrow edema within the C6 and C7 vertebral bodies, likely degenerative and progressed from the prior MRI of 08/06/2022. Spinal canal stenosis is greatest at C1-C2, C3-C4, C4-C5 and C6-C7 (mild-to-moderate at these levels). Multilevel foraminal stenosis. Most notably, severe foraminal stenosis is present on the right at C3-C4, on the right at C4-C5, bilaterally at C6-C7 and bilaterally at C7-T1. 5. Disc degeneration is advanced at C3-C4, C4-C5 and C7-T1. 6. C5-C6 vertebral ankylosis. Follow-up as outpatient  Hypertension Home meds:  amlodipine and Coreg Stable on high end Resume home amlodipine and Coreg Blood pressure goal: gradual normotension  Hyperlipidemia Home meds:  none on medication list LDL 95, goal < 70 Add Crestor 10 mg PO  daily (maximized daily dose per pharmacy) Continue statin at discharge  Diabetes type II Controlled Home meds:  Jardiance HgbA1c 6.4, goal < 7.0 CBGs SSI as needed Recommend close follow-up with PCP for better DM control  Other Stroke Risk Factors Advanced age  Other Active Problems CKD stage IV Cr 2.16--2.20 with BUN 57, at baseline Anemia of chronic disease Hgb at baseline 11.8 Rheumatoid arthritis Recent completion of 10 day prednisone course  Hospital day # 1  Lanae Boast, AGACNP-BC [facility] Pager: [phone]  ATTENDING NOTE: I reviewed above note and agree with the assessment and plan. Pt was seen and examined.   Echo tech is at the bedside performing echo. Pt is awake, alert, eyes open, orientated to age, place, time. No aphasia, fluent language, following all simple commands. Able to name and repeat. No gaze palsy, tracking bilaterally, visual field full. Mild left facial droop. Tongue midline. RUE at least 4/5, LUE chronic shoulder injury with deltoid 3/5, bicep and tricep with finger grip 3/5. BLE 3+/5. Sensation chronically decreased on  the RUE and BLEs, b/l FTN intact but slow on the left, gait not tested.  Patient stroke likely small vessel disease given location, size and risk factors.  Recommend DAPT for 3 weeks and then Plavix alone.  Put on Crestor 10.  Resume home BP meds.  Aggressive risk factor modification.  PT and OT pending.  For detailed assessment and plan, please refer to above/below as I have made changes wherever appropriate.   Neurology will sign off. Please call with questions. Pt will follow up with stroke clinic NP at [facility] in about 4 weeks. Thanks for the consult.   Marvel Plan, MD PhD Stroke Neurology 02/13/2023 3:48 PM   To contact Stroke Continuity provider, please refer to [email or web address]. After hours, contact General Neurology

## 2023-02-13 NOTE — Progress Notes (Signed)
PROGRESS NOTE    [name]  AVW:[id number] DOB: 1927-07-27 DOA: 02/12/2023 PCP: Nona Dell, NP    Chief Complaint  Patient presents with   Fall    Brief Narrative:   [name] is a 89 y.o. female with medical history significant of hypertension, CAD s/p CABG, CVA, diabetes mellitus type 2, rheumatoid arthritis presents with sudden bilateral loss of sensation in the hands and legs, leading to a fall.patient had been hospitalized in June of this year after presenting with left upper extremity weakness.  MRI of the brain was negative at that time, but neurology felt clinical syndrome was compelling and likely an MRI negative stroke after stopping aspirin.  -MRI brain was significant for acute CVA, she is admitted for further workup.   Assessment & Plan:   Principal Problem:   CVA (cerebral vascular accident) (HCC) Active Problems:   DM type 2 (diabetes mellitus, type 2) (HCC)   Essential hypertension   Hyperlipidemia   CKD (chronic kidney disease), stage IV (HCC)   Anemia of chronic disease   Arthritis of right hip  Acute CVA -Management per neurology -MRI significant for 11 mm acute infarct in the right  thalamocapsular junction.   -Check lipid panel and hemoglobin A1c -Check echocardiogram -Resume Plavix -Continue aspirin -A1c is pending, most recent A1c 6.4 in June -LDL is 95 -PT-OT is pending   Controlled diabetes mellitus type 2, without long-term use of insulin On admission glucose noted to be elevated up to 196.  Last hemoglobin A1c was noted to be 6.4 last checked 07/2022. -Follow-up repeat hemoglobin A1c -Continue Jardiance   Essential hypertension Home blood pressure regimen appears to include amlodipine 5 mg daily and Coreg 12.5 mg twice daily. -Continue with permissive hypertension -Hydralazine IV as needed for systolic blood pressures greater than 220 or diastolic blood pressures greater than 110   Hyperlipidemia Patient was started  on Crestor during last hospitalization, but does not appear to be on her current medication list. -LDL is  95, back on Crestor   Chronic kidney disease stage IV Creatinine 2.16 with BUN 57 which appears around patient's baseline. -Check urinalysis -Continue to monitor   Anemia of chronic disease Chronic.  Hemoglobin 11.8 which appears around patient's baseline. -Continue to monitor   Right hip pain Arthritis of the right hip Lumbar radiculopathy-sciatica Chronic.  Patient reports having right hip pain for which she has previously been seen by orthopedics for radiculopathy and osteoarthritis of the hip.  Patient reports having hallucinations with tramadol. -Continue Tylenol and warm compresses as needed   Rheumatoid arthritis -Patient reports she just finished a course of 10 days prednisone    DVT prophylaxis: [place] heparin Code Status: DNR Family Communication: none at bedside Disposition:   Status is: Inpatient    Consultants:  Neurology   Subjective:  Complaining of right lower extremity sciatica pain, chronic.  Objective: Vitals:   02/13/23 0555 02/13/23 0600 02/13/23 0725 02/13/23 0800  BP:   (!) 163/53 (!) 152/46  Pulse: 64 60 68 62  Resp: 16 (!) 0 17 17  Temp:    98.6 F (37 C)  TempSrc:    Oral  SpO2: 97% 98% 100% 99%  Weight:      Height:        Intake/Output Summary (Last 24 hours) at 02/13/2023 1017 Last data filed at 02/13/2023 0100 Gross per 24 hour  Intake 776.18 ml  Output --  Net 776.18 ml   Filed Weights   02/12/23 1131  Weight: 56 kg    Examination:  Awake Alert, Oriented X 3, frail Symmetrical Chest wall movement, Good air movement bilaterally, CTAB RRR,No Gallops,Rubs or new Murmurs, No Parasternal Heave +ve B.Sounds, Abd Soft, No tenderness, No rebound - guarding or rigidity. No Cyanosis, Clubbing or edema, No new Rash or bruise     Data Reviewed: I have personally reviewed following labs and imaging studies  CBC: Recent  Labs  Lab 02/12/23 1124 02/12/23 1159  WBC 7.9  --   NEUTROABS 6.0  --   HGB 11.8* 12.2  HCT 37.1 36.0  MCV 97.9  --   PLT 219  --     Basic Metabolic Panel: Recent Labs  Lab 02/12/23 1124 02/12/23 1159  NA 139 141  K 4.4 4.1  CL 104 106  CO2 23  --   GLUCOSE 196* 159*  BUN 57* 55*  CREATININE 2.16* 2.20*  CALCIUM 9.0  --   MG 2.2  --     GFR: Estimated Creatinine Clearance: 13.8 mL/min (A) (by C-G formula based on SCr of 2.2 mg/dL (H)).  Liver Function Tests: Recent Labs  Lab 02/12/23 1124  AST 28  ALT 15  ALKPHOS 77  BILITOT 0.5  PROT 6.6  ALBUMIN 3.3*    CBG: Recent Labs  Lab 02/12/23 1124 02/13/23 0821  GLUCAP 176* 126*     No results found for this or any previous visit (from the past 240 hours).       Radiology Studies: MR BRAIN WO CONTRAST Result Date: 02/12/2023 CLINICAL DATA:  Provided history: Neuro deficit, acute, stroke suspected. Additional history provided: Recent fall. Increased weakness/numbness. Paresthesias and weakness on left side. EXAM: MRI HEAD WITHOUT CONTRAST MRI CERVICAL SPINE WITHOUT CONTRAST TECHNIQUE: Multiplanar, multiecho pulse sequences of the brain and surrounding structures, and cervical spine, to include the craniocervical junction and cervicothoracic junction, were obtained without intravenous contrast. COMPARISON:  Head CT 02/12/2023. Brain MRI 08/06/2022. Cervical spine MRI 08/06/2022. FINDINGS: MRI HEAD FINDINGS Brain: Generalized cerebral atrophy. 11 mm acute infarct at the right thalamocapsular junction. Chronic lacunar infarcts again noted within the bilateral cerebral hemispheric white matter and within/about the bilateral deep gray nuclei. Background advanced multifocal T2 FLAIR hyperintense signal abnormality within the cerebral white matter, nonspecific but compatible with chronic small vessel ischemic disease. Minimal chronic small vessel ischemic changes also noted within the pons. No evidence of an  intracranial mass. No extra-axial fluid collection. No midline shift. Vascular: Maintained flow voids within the proximal large arterial vessels. Skull and upper cervical spine: No focal worrisome marrow lesion. Sinuses/Orbits: No mass or acute finding within the imaged orbits. Prior bilateral ocular lens replacement. No significant paranasal sinus disease. MRI CERVICAL SPINE FINDINGS The axial T2 sequences are moderately motion degraded. Within this limitation, findings are as follows. Alignment: Grade 1 anterolisthesis at C3-C4, C4-C5, C7-T1, T1-T2 and T2-T3. Vertebrae: Cervical vertebral body height is maintained. Vertebral body and left-sided facet ankylosis at C5-C6. Moderate to prominent marrow edema at C6-C7, progressed from the prior cervical spine MRI of 08/06/2022 and likely degenerative. Degenerative marrow edema also again noted at the C1-C2 level. Cord: Within limitations of motion degradation, no signal abnormality is identified within the cervical spinal cord. Posterior Fossa, vertebral arteries, paraspinal tissues: Posterior fossa assessed on concurrently performed brain MRI. Flow voids preserved within the imaged cervical vertebral arteries. No paraspinal mass or collection. Disc levels: Unless otherwise stated, the level by level findings below have not significantly changed from the prior MRI of 08/06/2022. Multilevel disc  degeneration. At the non-fused levels, disc degeneration is greatest at C3-C4, C4-C5 and C7-T1 (advanced at these levels). C1-C2: Ligamentous hypertrophy posterior to the dens. Ligamentum flavum thickening. Mild-to-moderate spinal canal stenosis with mild spinal cord flattening. C2-C3: Posterior disc osteophyte complex with right greater than left disc osteophyte ridge/uncinate hypertrophy. Facet arthropathy (greater on the left). Ligamentum flavum thickening. Mild spinal canal narrowing. Moderate right neural foraminal narrowing. C3-C4: Grade 1 anterolisthesis. Disc uncovering.  Posterior disc osteophyte complex with right-sided disc osteophyte ridge/uncinate hypertrophy. Facet arthropathy (greater on the left). Ligamentum flavum thickening. Mild-to-moderate spinal canal stenosis. Bilateral neural foraminal narrowing (severe right, moderate left). C4-C5: Grade 1 anterolisthesis. Disc uncovering. Posterior disc osteophyte complex with bilateral disc osteophyte ridge/uncinate hypertrophy. Facet arthropathy. Ligamentum flavum thickening. Mild-to-moderate spinal canal stenosis. Bilateral neural foraminal narrowing (severe right, moderate left). C5-C6: Vertebral ankylosis. Vertebral osteophytosis. Bilateral uncovertebral hypertrophy. No significant spinal canal stenosis. Bilateral neural foraminal narrowing (moderate right, mild left). C6-C7: Posterior disc osteophyte complex with bilateral disc osteophyte ridge/uncinate hypertrophy. Facet arthropathy (greater on the right). Ligamentum flavum thickening. Mild-to-moderate spinal canal stenosis. Severe bilateral neural foraminal narrowing. C7-T1: Grade 1 anterolisthesis. Disc uncovering with disc bulge. Facet arthropathy (greater on the left). Mild spinal canal stenosis. Severe bilateral neural foraminal narrowing. T1-T2: Grade 1 anterolisthesis. Disc uncovering with disc bulge. Facet arthropathy. Ligamentum flavum thickening. Mild spinal canal narrowing. Bilateral neural foraminal narrowing (moderate right, mild left). IMPRESSION: MRI brain: 1. 11 mm acute infarct at the right thalamocapsular junction. 2. Background parenchymal atrophy, advanced chronic small vessel ischemic disease and chronic lacunar infarcts as described. MRI cervical spine: 1. Motion degraded exam. 2. Spondylosis at the cervical and visualized upper thoracic levels as outlined within the body of the report. Moderate-to-prominent marrow edema within the C6 and C7 vertebral bodies, likely degenerative and progressed from the prior MRI of 08/06/2022. Cervical spondylosis has  otherwise not significantly changed from this prior study. 3. Spinal canal stenosis is greatest at C1-C2, C3-C4, C4-C5 and C6-C7 (mild-to-moderate at these levels). 4. Multilevel foraminal stenosis. Most notably, severe foraminal stenosis is present on the right at C3-C4, on the right at C4-C5, bilaterally at C6-C7 and bilaterally at C7-T1. 5. Disc degeneration is advanced at C3-C4, C4-C5 and C7-T1. 6. C5-C6 vertebral ankylosis. Electronically Signed   By: Jackey Loge D.O.   On: 02/12/2023 15:30   MR Cervical Spine Wo Contrast Result Date: 02/12/2023 CLINICAL DATA:  Provided history: Neuro deficit, acute, stroke suspected. Additional history provided: Recent fall. Increased weakness/numbness. Paresthesias and weakness on left side. EXAM: MRI HEAD WITHOUT CONTRAST MRI CERVICAL SPINE WITHOUT CONTRAST TECHNIQUE: Multiplanar, multiecho pulse sequences of the brain and surrounding structures, and cervical spine, to include the craniocervical junction and cervicothoracic junction, were obtained without intravenous contrast. COMPARISON:  Head CT 02/12/2023. Brain MRI 08/06/2022. Cervical spine MRI 08/06/2022. FINDINGS: MRI HEAD FINDINGS Brain: Generalized cerebral atrophy. 11 mm acute infarct at the right thalamocapsular junction. Chronic lacunar infarcts again noted within the bilateral cerebral hemispheric white matter and within/about the bilateral deep gray nuclei. Background advanced multifocal T2 FLAIR hyperintense signal abnormality within the cerebral white matter, nonspecific but compatible with chronic small vessel ischemic disease. Minimal chronic small vessel ischemic changes also noted within the pons. No evidence of an intracranial mass. No extra-axial fluid collection. No midline shift. Vascular: Maintained flow voids within the proximal large arterial vessels. Skull and upper cervical spine: No focal worrisome marrow lesion. Sinuses/Orbits: No mass or acute finding within the imaged orbits. Prior  bilateral ocular lens replacement. No significant  paranasal sinus disease. MRI CERVICAL SPINE FINDINGS The axial T2 sequences are moderately motion degraded. Within this limitation, findings are as follows. Alignment: Grade 1 anterolisthesis at C3-C4, C4-C5, C7-T1, T1-T2 and T2-T3. Vertebrae: Cervical vertebral body height is maintained. Vertebral body and left-sided facet ankylosis at C5-C6. Moderate to prominent marrow edema at C6-C7, progressed from the prior cervical spine MRI of 08/06/2022 and likely degenerative. Degenerative marrow edema also again noted at the C1-C2 level. Cord: Within limitations of motion degradation, no signal abnormality is identified within the cervical spinal cord. Posterior Fossa, vertebral arteries, paraspinal tissues: Posterior fossa assessed on concurrently performed brain MRI. Flow voids preserved within the imaged cervical vertebral arteries. No paraspinal mass or collection. Disc levels: Unless otherwise stated, the level by level findings below have not significantly changed from the prior MRI of 08/06/2022. Multilevel disc degeneration. At the non-fused levels, disc degeneration is greatest at C3-C4, C4-C5 and C7-T1 (advanced at these levels). C1-C2: Ligamentous hypertrophy posterior to the dens. Ligamentum flavum thickening. Mild-to-moderate spinal canal stenosis with mild spinal cord flattening. C2-C3: Posterior disc osteophyte complex with right greater than left disc osteophyte ridge/uncinate hypertrophy. Facet arthropathy (greater on the left). Ligamentum flavum thickening. Mild spinal canal narrowing. Moderate right neural foraminal narrowing. C3-C4: Grade 1 anterolisthesis. Disc uncovering. Posterior disc osteophyte complex with right-sided disc osteophyte ridge/uncinate hypertrophy. Facet arthropathy (greater on the left). Ligamentum flavum thickening. Mild-to-moderate spinal canal stenosis. Bilateral neural foraminal narrowing (severe right, moderate left). C4-C5:  Grade 1 anterolisthesis. Disc uncovering. Posterior disc osteophyte complex with bilateral disc osteophyte ridge/uncinate hypertrophy. Facet arthropathy. Ligamentum flavum thickening. Mild-to-moderate spinal canal stenosis. Bilateral neural foraminal narrowing (severe right, moderate left). C5-C6: Vertebral ankylosis. Vertebral osteophytosis. Bilateral uncovertebral hypertrophy. No significant spinal canal stenosis. Bilateral neural foraminal narrowing (moderate right, mild left). C6-C7: Posterior disc osteophyte complex with bilateral disc osteophyte ridge/uncinate hypertrophy. Facet arthropathy (greater on the right). Ligamentum flavum thickening. Mild-to-moderate spinal canal stenosis. Severe bilateral neural foraminal narrowing. C7-T1: Grade 1 anterolisthesis. Disc uncovering with disc bulge. Facet arthropathy (greater on the left). Mild spinal canal stenosis. Severe bilateral neural foraminal narrowing. T1-T2: Grade 1 anterolisthesis. Disc uncovering with disc bulge. Facet arthropathy. Ligamentum flavum thickening. Mild spinal canal narrowing. Bilateral neural foraminal narrowing (moderate right, mild left). IMPRESSION: MRI brain: 1. 11 mm acute infarct at the right thalamocapsular junction. 2. Background parenchymal atrophy, advanced chronic small vessel ischemic disease and chronic lacunar infarcts as described. MRI cervical spine: 1. Motion degraded exam. 2. Spondylosis at the cervical and visualized upper thoracic levels as outlined within the body of the report. Moderate-to-prominent marrow edema within the C6 and C7 vertebral bodies, likely degenerative and progressed from the prior MRI of 08/06/2022. Cervical spondylosis has otherwise not significantly changed from this prior study. 3. Spinal canal stenosis is greatest at C1-C2, C3-C4, C4-C5 and C6-C7 (mild-to-moderate at these levels). 4. Multilevel foraminal stenosis. Most notably, severe foraminal stenosis is present on the right at C3-C4, on the  right at C4-C5, bilaterally at C6-C7 and bilaterally at C7-T1. 5. Disc degeneration is advanced at C3-C4, C4-C5 and C7-T1. 6. C5-C6 vertebral ankylosis. Electronically Signed   By: Jackey Loge D.O.   On: 02/12/2023 15:30   CT HEAD WO CONTRAST Result Date: 02/12/2023 CLINICAL DATA:  Neuro deficit, acute, stroke suspected. Fall. No visible injuries. EXAM: CT HEAD WITHOUT CONTRAST TECHNIQUE: Contiguous axial images were obtained from the base of the skull through the vertex without intravenous contrast. RADIATION DOSE REDUCTION: This exam was performed according to the departmental dose-optimization program which  includes automated exposure control, adjustment of the mA and/or kV according to patient size and/or use of iterative reconstruction technique. COMPARISON:  CT head without contrast 08/06/2022. MR head without contrast 08/06/2022. FINDINGS: Brain: Advanced atrophy and white matter disease is similar the prior exam. Remote lacunar infarcts of the basal ganglia and corona radiata bilaterally are stable. No acute infarct, hemorrhage, or mass lesion is present. The ventricles are of normal size. No significant extraaxial fluid collection is present. The brainstem and cerebellum are within normal limits. Midline structures are within normal limits. Vascular: Atherosclerotic calcifications are present within the cavernous internal carotid arteries and at the dural margin of both vertebral arteries. Skull: Calvarium is intact. No focal lytic or blastic lesions are present. No significant extracranial soft tissue lesion is present. Sinuses/Orbits: The paranasal sinuses and mastoid air cells are clear. The globes and orbits are within normal limits. IMPRESSION: 1. No acute intracranial abnormality or significant interval change. 2. Advanced atrophy and white matter disease likely reflects the sequela of chronic microvascular ischemia. 3. Remote lacunar infarcts of the basal ganglia and corona radiata bilaterally  are stable. Electronically Signed   By: Marin Roberts M.D.   On: 02/12/2023 13:15   DG Chest Portable 1 View Result Date: 02/12/2023 CLINICAL DATA:  Left-sided weakness and numbness.  Fell yesterday. EXAM: PORTABLE CHEST 1 VIEW COMPARISON:  Chest x-ray dated June 11, 2014. FINDINGS: The patient is rotated to the left. Stable cardiomediastinal silhouette with normal heart size status post CABG. Normal pulmonary vascularity. No focal consolidation, pleural effusion, or pneumothorax. No acute osseous abnormality. IMPRESSION: No active disease. Electronically Signed   By: Obie Dredge M.D.   On: 02/12/2023 12:24        Scheduled Meds:  aspirin EC  81 mg Oral Daily   clopidogrel  75 mg Oral Daily   empagliflozin  10 mg Oral Daily   heparin  5,000 Units Subcutaneous Q8H   rosuvastatin  10 mg Oral Daily   Continuous Infusions:  sodium chloride 40 mL/hr at 02/12/23 1734     LOS: 1 day     Huey Bienenstock, MD [facility]   To contact the attending provider between 7A-7P or the covering provider during after hours 7P-7A, please log into the web site [email or web address] and access using universal [place] password for that web site. If you do not have the password, please call the hospital operator.  02/13/2023, 10:17 AM

## 2023-02-13 NOTE — Evaluation (Signed)
Speech Language Pathology Evaluation Patient Details Name: [name] MRN: [id number] DOB: 1927-12-01 Today's Date: 02/13/2023 Time: 4259-5638 SLP Time Calculation (min) (ACUTE ONLY): 17 min  Problem List:  Patient Active Problem List   Diagnosis Date Noted   Acute CVA (cerebrovascular accident) (HCC) 02/13/2023   Anemia of chronic disease 02/12/2023   CVA (cerebral vascular accident) (HCC) 08/07/2022   History of CAD (coronary artery disease) of artery bypass graft 08/07/2022   Arthritis of right hip 08/06/2021   Unilateral primary osteoarthritis, right knee 08/06/2021   Closed compression fracture of body of L1 vertebra (HCC) 07/05/2021   Peripheral arterial disease (HCC) 03/17/2021   Onychomycosis 02/28/2021   Right leg pain 07/26/2020   Onychodystrophy 07/26/2020   CKD (chronic kidney disease), stage IV (HCC) 07/19/2020   Hypertensive heart and renal disease 03/22/2020   Gout 12/07/2017   DM type 2 (diabetes mellitus, type 2) (HCC) 02/11/2014   History of coronary artery bypass graft 07/01/2010   MYOCARDIAL INFARCTION, INFERIOR WALL, SUBSEQUENT CARE 05/12/2008   CAD, NATIVE VESSEL 05/12/2008   Hyperlipidemia 05/11/2008   Essential hypertension 05/11/2008   CAD, UNSPECIFIED SITE 05/11/2008   CEREBROVASCULAR DISEASE 05/11/2008   Rheumatoid arthritis (HCC) 05/11/2008   Past Medical History:  Past Medical History:  Diagnosis Date   Cerebrovascular disease, unspecified    Coronary atherosclerosis of unspecified type of vessel, native or graft    CVA (cerebral vascular accident) (HCC)    Diabetes mellitus without complication (HCC)    Other and unspecified hyperlipidemia    Rheumatoid arthritis(714.0)    Shingles 2003   Unspecified essential hypertension    Past Surgical History:  Past Surgical History:  Procedure Laterality Date   Cardiac Bypass  2003   CATARACT EXTRACTION, BILATERAL  2007   HPI:  [name] is a 89 y.o. female with medical history  significant of hypertension, CAD s/p CABG, CVA, diabetes mellitus type 2, rheumatoid arthritis presents with sudden bilateral loss of sensation in the hands and legs, leading to a fall. MRI 11 mm acute infarct at the right thalamocapsular junction. Chronic lacunar infarcts again noted within the bilateral cerebral  hemispheric white matter and within/about the bilateral deep gray nuclei.   Assessment / Plan / Recommendation Clinical Impression  Family member not present to confirm her baseline cognitive function. Pt reports "I don't talk as rapidly as I did" but had no concerns with cognition. Overall, she appears functional for the acute setting. She was able to attend to therapist, was oriented to place, situaiton, demonstrate adequate problem solving concerning call bell and with use of menu. No impairment with language or speech intelligibility. She did have difficulty word recall and recalled 1 of  4 words but recalled with semantic cue (retrieval deficit). She stated she uses her appointment cards to remember appointments. Pt encouraged to use a calendar. She was able to state some of her meds and reports she has a new way of managing them  since pharmacy places them in am and pm packs and she feels she would not have difficulty taking them. ST will not pick pt up on acute venue but recommend she have supervision with meds initially and possibly home health ST to evaluate. Pt also has a personal care attendant for some of the day at home.    SLP Assessment  SLP Recommendation/Assessment: All further Speech Lanaguage Pathology  needs can be addressed in the next venue of care SLP Visit Diagnosis: Cognitive communication deficit (V56.433)    Recommendations for follow  up therapy are one component of a multi-disciplinary discharge planning process, led by the attending physician.  Recommendations may be updated based on patient status, additional functional criteria and insurance authorization.     Follow Up Recommendations  Home health SLP    Assistance Recommended at Discharge     Functional Status Assessment Patient has not had a recent decline in their functional status  Frequency and Duration           SLP Evaluation Cognition  Overall Cognitive Status: Impaired/Different from baseline Arousal/Alertness: Awake/alert Orientation Level: Oriented to person;Oriented to place;Oriented to time;Oriented to situation Year: 2024 Attention: Sustained Sustained Attention: Appears intact Memory: Impaired Memory Impairment: Retrieval deficit;Other (comment) (recalled 1 of 4 words) Awareness: Appears intact Problem Solving: Appears intact Safety/Judgment: Other (comment) (may need supervision with meds)       Comprehension  Auditory Comprehension Overall Auditory Comprehension: Appears within functional limits for tasks assessed Commands: Within Functional Limits Visual Recognition/Discrimination Discrimination: Not tested Reading Comprehension Reading Status: Not tested    Expression Expression Primary Mode of Expression: Verbal Verbal Expression Overall Verbal Expression: Appears within functional limits for tasks assessed Initiation: No impairment Level of Generative/Spontaneous Verbalization: Conversation Repetition:  (NT) Naming: Impairment (named 5 animals in one min) Divergent:  (5 animals in one minute) Pragmatics: No impairment Written Expression Written Expression: Not tested   Oral / Motor  Oral Motor/Sensory Function Overall Oral Motor/Sensory Function: Mild impairment Facial ROM: Reduced left;Suspected CN VII (facial) dysfunction (some pooled saliva on left intermittently) Facial Symmetry: Abnormal symmetry left Motor Speech Overall Motor Speech: Appears within functional limits for tasks assessed Respiration: Within functional limits Phonation: Normal Resonance: Within functional limits Articulation: Within functional limitis Intelligibility:  Intelligible Motor Planning: Witnin functional limits Motor Speech Errors: Not applicable            [name] 02/13/2023, 3:39 PM

## 2023-02-13 NOTE — TOC Initial Note (Signed)
Transition of Care [facility]) - Initial/Assessment Note    Patient Details  Name: [name] MRN: [id number] Date of Birth: May 07, 1927  Transition of Care [facility]) CM/SW Contact:    Mearl Latin, LCSW Phone Number: 02/13/2023, 4:51 PM  Clinical Narrative:                 CSW received consult for possible SNF placement at time of discharge. CSW spoke with patient. Patient reported that patient's family is currently unable to care for patient at home given patient's current physical needs and fall risk. Patient expressed understanding of PT recommendation and is agreeable to SNF placement at time of discharge. Patient reports preference for [facility] as she has been there before or somewhere with High ratings. CSW discussed insurance authorization process and will provide Medicare SNF ratings list. CSW will send out referrals for review and provide bed offers as available.   No answer from [facility]. Eligha Bridegroom is reviewing.   Skilled Nursing Rehab Facilities-   [email or web address]   Ratings out of 5 stars (5 the highest)   Name Address  Phone # Quality Care Staffing Health Inspection Overall  [facility] [address] [phone] 2 2 5 5   [facility] [address], [place] [phone] 4 2 4 4   [name] [address], [facility] [phone] [facility] [address], [place] [phone] 3 1 4 3   [facility]  [address], [place] [phone] 4 4 5 5   [facility] [address] [facility] [phone] 3 2 2 2   [facility] [address], [place] [phone] 5 1 2 2   [facility] [address], [place] [phone] 1 1 3 1   [address] ([place]) [address], [place] [phone] 2 2 2 2   [facility] [address] [place], [place] [phone] 2 2 1 1   [facility] ([place]) [address], [place]  [phone] 3 1 1 1   Eligha Bridegroom [address] Liliane Shi [phone] 3 3 4 4   [facility] [address], [place] [phone] 2 2 3 3           [facility] [address], [place] [phone] 4 2 1 1   [address] [place] [address], [place] [phone] 1 1 2 1   [facility] [address] [place] [phone] 2 1 4 3   [facility] [address] [place] Ditto [phone] 2 1 4 3   [facility] [address], [place] [phone] 2 3 3 3           [address] (no [facility]) [address], [place] [phone] 4 5 5 5   [facility] (No Humana) [address] [place], [place] [phone] 1 2 4 3   [facility] [address], [place] [phone] 2 1 2 1   [facility] (No UHC) [address], [place] [phone] 4 1 5 4   [facility] [address], [facility] [phone] 3 4 2 2   [facility] [address], [place] [phone] 2 1 1 1   [place] [address] [place] [phone] 4 2 5 5   [facility]  [address] [place] [phone] 2 2 3 3   [facility] [address], [place] [phone] 4 1 1 1   [facility] [address] [place], [place] [phone] 2 2 3 3           [facility] [address]  [address] [place] [phone] 2 1 1 1   [facility] [address], Evlyn Clines  [phone] 3 3 3 3   [facility] (No Humana) [address] [place], [place] [phone] 2 2 4 4   [facility]) [address], [place]al[place] Levan [phone] 2 1 1 1   [facility] [address], [place] [phone] [facility] [address] [place], [place] [phone] 1 1 1 1           [facility] [address] [place], [place] [phone] 5 4 5 5   [facility])  [address], [place] [phone] 1 1 2 1   [facility]) [address] [place] [phone]  2 4 4   [facility]  [address], [place] [phone] 3 5 5 5   [address] [place], [place] [phone] 4 2 2 2   [facility]) [address] [place] [phone] 2 1 3 2      Expected Discharge Plan: Skilled Nursing Facility Barriers to Discharge: Continued Medical Work up, SNF Pending bed offer, Insurance Authorization   Patient Goals and CMS Choice Patient states their goals for this hospitalization and ongoing recovery are:: Rehab CMS Medicare.gov Compare Post Acute Care list provided to:: Patient Choice offered to / list presented to : Patient [place] ownership interest in [facility].provided to:: Patient    Expected Discharge Plan and Services In-house Referral: Clinical Social Work   Post Acute Care Choice: Skilled Nursing Facility Living arrangements for the past 2 months: Single Family Home                                      Prior Living Arrangements/Services Living arrangements for the past 2 months: Single Family Home   Patient language and need for interpreter reviewed:: Yes Do you feel safe going back to the place where you live?: Yes      Need for Family Participation in Patient Care: Yes (Comment) Care giver support system in place?: Yes (comment) Current home services: Homehealth aide, DME Criminal Activity/Legal Involvement Pertinent to Current Situation/Hospitalization: No - Comment as needed  Activities of Daily Living      Permission Sought/Granted Permission sought to share information with : Facility Industrial/product designer granted to share information with : Yes, Verbal Permission Granted     Permission granted to share info w AGENCY: SNFs        Emotional Assessment Appearance:: Appears stated age Attitude/Demeanor/Rapport: Engaged Affect (typically observed): Accepting, Pleasant Orientation: : Oriented to Self, Oriented to Place, Oriented to  Time, Oriented to  Situation Alcohol / Substance Use: Not Applicable Psych Involvement: No (comment)  Admission diagnosis:  CVA (cerebral vascular accident) (HCC) [I63.9] Acute ischemic stroke (HCC) [I63.9] Acute CVA (cerebrovascular accident) [facility]) [I63.9] Patient Active Problem List   Diagnosis Date Noted   Acute CVA (cerebrovascular accident) (HCC) 02/13/2023   Anemia of chronic disease 02/12/2023   CVA (cerebral vascular accident) (HCC) 08/07/2022   History of CAD (coronary artery disease) of artery bypass graft 08/07/2022   Arthritis of right hip 08/06/2021   Unilateral primary osteoarthritis, right knee 08/06/2021   Closed compression fracture of body of L1 vertebra (HCC) 07/05/2021   Peripheral arterial disease (HCC) 03/17/2021   Onychomycosis 02/28/2021   Right leg pain 07/26/2020   Onychodystrophy 07/26/2020   CKD (chronic kidney disease), stage IV (HCC)  07/19/2020   Hypertensive heart and renal disease 03/22/2020   Gout 12/07/2017   DM type 2 (diabetes mellitus, type 2) (HCC) 02/11/2014   History of coronary artery bypass graft 07/01/2010   MYOCARDIAL INFARCTION, INFERIOR WALL, SUBSEQUENT CARE 05/12/2008   CAD, NATIVE VESSEL 05/12/2008   Hyperlipidemia 05/11/2008   Essential hypertension 05/11/2008   CAD, UNSPECIFIED SITE 05/11/2008   CEREBROVASCULAR DISEASE 05/11/2008   Rheumatoid arthritis (HCC) 05/11/2008   PCP:  Nona Dell, NP Pharmacy:   [facility] [phone] [place] Otto, [place] - [address] AT [address] [place] [address] Phone: [phone] Fax: [phone]  [facility] #[id number] [place] Otto, [place] - [address] AT [facility] & [address] [place] [address] Phone: [phone] Fax: [phone]  [facility] #[id number] [place] Otto,  - [address] AT [facility] [address] & [address] [place] [address] Phone: [phone] Fax:  [phone]  [facility] [place], [place] - [address] [place] [address] Phone: [phone] Fax: [phone]     Social Drivers of Health (SDOH) Social History: SDOH Screenings   Food Insecurity: No Food Insecurity (02/13/2023)  Housing: Unknown (02/13/2023)  Transportation Needs: No Transportation Needs (02/13/2023)  Utilities: Not At Risk (02/13/2023)  Alcohol Screen: Low Risk  (11/14/2020)  Depression (PHQ2-9): Low Risk  (03/27/2022)  Financial Resource Strain: Low Risk  (03/27/2022)  Physical Activity: Inactive (03/27/2022)  Social Connections: Moderately Integrated (11/14/2020)  Stress: No Stress Concern Present (03/27/2022)  Tobacco Use: Low Risk  (02/12/2023)   SDOH Interventions:     Readmission Risk Interventions     No data to display

## 2023-02-13 NOTE — Plan of Care (Signed)
  Problem: Nutrition: Goal: Risk of aspiration will decrease Outcome: Progressing   Problem: Clinical Measurements: Goal: Ability to maintain clinical measurements within normal limits will improve Outcome: Progressing   Problem: Ischemic Stroke/TIA Tissue Perfusion: Goal: Complications of ischemic stroke/TIA will be minimized 02/13/2023 0322 by Arnetha Courser, RN Outcome: Progressing 02/12/2023 2122 by Arnetha Courser, RN Outcome: Progressing

## 2023-02-13 NOTE — Care Management Obs Status (Signed)
MEDICARE OBSERVATION STATUS NOTIFICATION   Patient Details  Name: [name] MRN: [id number] Date of Birth: 09-20-1927   Medicare Observation Status Notification Given:  Yes    Gordy Clement, RN 02/13/2023, 3:55 PM

## 2023-02-14 DIAGNOSIS — I63311 Cerebral infarction due to thrombosis of right middle cerebral artery: Secondary | ICD-10-CM

## 2023-02-14 DIAGNOSIS — I63331 Cerebral infarction due to thrombosis of right posterior cerebral artery: Secondary | ICD-10-CM | POA: Diagnosis not present

## 2023-02-14 LAB — CBC
HCT: 34.1 % — ABNORMAL LOW (ref 36.0–46.0)
Hemoglobin: 11.2 g/dL — ABNORMAL LOW (ref 12.0–15.0)
MCH: 31.3 pg (ref 26.0–34.0)
MCHC: 32.8 g/dL (ref 30.0–36.0)
MCV: 95.3 fL (ref 80.0–100.0)
Platelets: 200 10*3/uL (ref 150–400)
RBC: 3.58 MIL/uL — ABNORMAL LOW (ref 3.87–5.11)
RDW: 14.1 % (ref 11.5–15.5)
WBC: 6.1 10*3/uL (ref 4.0–10.5)
nRBC: 0 % (ref 0.0–0.2)

## 2023-02-14 LAB — BASIC METABOLIC PANEL
Anion gap: 10 (ref 5–15)
BUN: 58 mg/dL — ABNORMAL HIGH (ref 8–23)
CO2: 22 mmol/L (ref 22–32)
Calcium: 8.8 mg/dL — ABNORMAL LOW (ref 8.9–10.3)
Chloride: 104 mmol/L (ref 98–111)
Creatinine, Ser: 2.23 mg/dL — ABNORMAL HIGH (ref 0.44–1.00)
GFR, Estimated: 20 mL/min — ABNORMAL LOW (ref >60)
Glucose, Bld: 201 mg/dL — ABNORMAL HIGH (ref 70–99)
Potassium: 4.2 mmol/L (ref 3.5–5.1)
Sodium: 136 mmol/L (ref 135–145)

## 2023-02-14 LAB — PHOSPHORUS: Phosphorus: 3.4 mg/dL (ref 2.5–4.6)

## 2023-02-14 LAB — VITAMIN B12: Vitamin B-12: 344 pg/mL (ref 180–914)

## 2023-02-14 LAB — PROCALCITONIN: Procalcitonin: <0.1 ng/mL

## 2023-02-14 LAB — MAGNESIUM: Magnesium: 2.1 mg/dL (ref 1.7–2.4)

## 2023-02-14 MED ORDER — AMLODIPINE BESYLATE 5 MG PO TABS
2.5000 mg | ORAL_TABLET | Freq: Every day | ORAL | Status: DC
  Administered 2023-02-14: 2.5 mg via ORAL
  Filled 2023-02-14: qty 1

## 2023-02-14 NOTE — Plan of Care (Signed)
  Problem: Education: Goal: Knowledge of disease or condition will improve Outcome: Progressing Goal: Knowledge of secondary prevention will improve Outcome: Progressing Goal: Knowledge of patient specific risk factors will improve Outcome: Progressing   Problem: Ischemic Stroke/TIA Tissue Perfusion: Goal: Complications of ischemic stroke/TIA will be minimized Outcome: Progressing   Problem: Coping: Goal: Will verbalize positive feelings about self Outcome: Progressing Goal: Will identify appropriate support needs Outcome: Progressing   Problem: Health Behavior/Discharge Planning: Goal: Ability to manage health-related needs will improve Outcome: Progressing Goal: Goals will be collaboratively established with patient/family Outcome: Progressing   Problem: Self-Care: Goal: Ability to participate in self-care as condition permits will improve Outcome: Progressing Goal: Verbalization of feelings and concerns over difficulty with self-care will improve Outcome: Progressing Goal: Ability to communicate needs accurately will improve Outcome: Progressing   Problem: Nutrition: Goal: Risk of aspiration will decrease Outcome: Progressing Goal: Dietary intake will improve Outcome: Progressing   Problem: Education: Goal: Knowledge of General Education information will improve Description: Including pain rating scale, medication(s)/side effects and non-pharmacologic comfort measures Outcome: Progressing   Problem: Health Behavior/Discharge Planning: Goal: Ability to manage health-related needs will improve Outcome: Progressing   Problem: Clinical Measurements: Goal: Ability to maintain clinical measurements within normal limits will improve Outcome: Progressing Goal: Will remain free from infection Outcome: Progressing Goal: Diagnostic test results will improve Outcome: Progressing Goal: Respiratory complications will improve Outcome: Progressing Goal: Cardiovascular  complication will be avoided Outcome: Progressing   Problem: Activity: Goal: Risk for activity intolerance will decrease Outcome: Progressing   Problem: Nutrition: Goal: Adequate nutrition will be maintained Outcome: Progressing   Problem: Coping: Goal: Level of anxiety will decrease Outcome: Progressing   Problem: Elimination: Goal: Will not experience complications related to bowel motility Outcome: Progressing Goal: Will not experience complications related to urinary retention Outcome: Progressing   Problem: Pain Management: Goal: General experience of comfort will improve Outcome: Progressing   Problem: Safety: Goal: Ability to remain free from injury will improve Outcome: Progressing   Problem: Skin Integrity: Goal: Risk for impaired skin integrity will decrease Outcome: Progressing

## 2023-02-14 NOTE — Progress Notes (Signed)
PROGRESS NOTE    [name]  IEP:[id number] DOB: 02-24-1927 DOA: 02/12/2023 PCP: Nona Dell, NP    Chief Complaint  Patient presents with   Fall    Brief Narrative:   [name] is a 89 y.o. female with medical history significant of hypertension, CAD s/p CABG, CVA, diabetes mellitus type 2, rheumatoid arthritis presents with sudden bilateral loss of sensation in the hands and legs, leading to a fall.patient had been hospitalized in June of this year after presenting with left upper extremity weakness.  MRI of the brain was negative at that time, but neurology felt clinical syndrome was compelling and likely an MRI negative stroke after stopping aspirin.  -MRI brain was significant for acute CVA, she is admitted for further workup.  She was seen by neurology, recommendation for dual antiplatelet therapy aspirin and Plavix, then Plavix alone.  Patient is medically cleared for discharge, awaiting SNF bed availability   Assessment & Plan:   Principal Problem:   CVA (cerebral vascular accident) (HCC) Active Problems:   DM type 2 (diabetes mellitus, type 2) (HCC)   Essential hypertension   Hyperlipidemia   CKD (chronic kidney disease), stage IV (HCC)   Anemia of chronic disease   Arthritis of right hip   Acute CVA (cerebrovascular accident) (HCC)  Acute CVA -MRI significant for 11 mm acute infarct in the right  thalamocapsular junction.   -Neurology input greatly appreciated, it was felt secondary to small vessel disease, he was on aspirin prior to admission, recommendation for dual antiplatelet therapy aspirin and Plavix for 3 weeks, then Plavix alone. -2D echo with preserved EF 60 to 65% -A1c acceptable at 6.6 -LDL is 95 back on statin -PT/OT consult greatly appreciated, recommendation for SNF, when bed is available, awaiting insurance approval and bed availability   Controlled diabetes mellitus type 2, without long-term use of insulin -A1c is acceptable at  6.6, continue with Jardiance  Essential hypertension Home blood pressure regimen appears to include amlodipine 5 mg daily and Coreg 12.5 mg twice daily. -Normotension currently per neurology, back on Coreg and amlodipine, but blood pressure soft this morning so we will decrease amlodipine to 2.5 mg p.o. daily   Hyperlipidemia Patient was started on Crestor during last hospitalization, but does not appear to be on her current medication list. -LDL is  95, back on Crestor   Chronic kidney disease stage IV Creatinine 2.16 with BUN 57 which appears around patient's baseline.    Anemia of chronic disease Chronic.  Hemoglobin 11.8 which appears around patient's baseline. -Continue to monitor   Right hip pain Arthritis of the right hip Lumbar radiculopathy-sciatica Chronic.  Patient reports having right hip pain for which she has previously been seen by orthopedics for radiculopathy and osteoarthritis of the hip.  Patient reports having hallucinations with tramadol. -Continue Tylenol and warm compresses as needed   Rheumatoid arthritis -Patient reports she just finished a course of 10 days prednisone    DVT prophylaxis: [place] heparin Code Status: DNR Family Communication: none at bedside, tried to reach son Adele Schilder by phone today, no availability. Disposition: SNF when bed is available  Status is: Inpatient    Consultants:  Neurology   Subjective:  No significant events overnight, she denies any complaints today  Objective: Vitals:   02/14/23 0000 02/14/23 0400 02/14/23 0742 02/14/23 0837  BP:   (!) 136/55 (!) 140/50  Pulse:   61 61  Resp:   15 17  Temp: 98.2 F (36.8 C) 97.9 F (36.6  C) 98.1 F (36.7 C)   TempSrc: Oral Oral    SpO2:  96%  100%  Weight:      Height:       No intake or output data in the 24 hours ending 02/14/23 1123  Filed Weights   02/12/23 1131  Weight: 56 kg    Examination:  Awake Alert, Oriented X 3, frail Symmetrical Chest wall  movement, Good air movement bilaterally, CTAB RRR,No Gallops,Rubs or new Murmurs, No Parasternal Heave +ve B.Sounds, Abd Soft, No tenderness, No rebound - guarding or rigidity. No Cyanosis, Clubbing or edema, No new Rash or bruise      Data Reviewed: I have personally reviewed following labs and imaging studies  CBC: Recent Labs  Lab 02/12/23 1124 02/12/23 1159  WBC 7.9  --   NEUTROABS 6.0  --   HGB 11.8* 12.2  HCT 37.1 36.0  MCV 97.9  --   PLT 219  --     Basic Metabolic Panel: Recent Labs  Lab 02/12/23 1124 02/12/23 1159  NA 139 141  K 4.4 4.1  CL 104 106  CO2 23  --   GLUCOSE 196* 159*  BUN 57* 55*  CREATININE 2.16* 2.20*  CALCIUM 9.0  --   MG 2.2  --     GFR: Estimated Creatinine Clearance: 13.8 mL/min (A) (by C-G formula based on SCr of 2.2 mg/dL (H)).  Liver Function Tests: Recent Labs  Lab 02/12/23 1124  AST 28  ALT 15  ALKPHOS 77  BILITOT 0.5  PROT 6.6  ALBUMIN 3.3*    CBG: Recent Labs  Lab 02/12/23 1124 02/13/23 0821  GLUCAP 176* 126*     No results found for this or any previous visit (from the past 240 hours).       Radiology Studies: VAS US CAROTID Result Date: 02/13/2023 Carotid Arterial Duplex Study Patient Name:  [name]  Date of Exam:   02/13/2023 Medical Rec #: [id number]        Accession #:    [id number] Date of Birth: 27-Nov-1927        Patient Gender: F Patient Age:   72 years Exam Location:  [facility] Procedure:      VAS US CAROTID Referring Phys: Scheryl Marten XU --------------------------------------------------------------------------------  Indications:       CVA and S/P CABG 2003. Risk Factors:      Hypertension, Diabetes, coronary artery disease, prior CVA. Comparison Study:  Previous study on 6.20.2024. Performing Technologist: Fernande Bras  Examination Guidelines: A complete evaluation includes B-mode imaging, spectral Doppler, color Doppler, and power Doppler as needed of all accessible portions of  each vessel. Bilateral testing is considered an integral part of a complete examination. Limited examinations for reoccurring indications may be performed as noted.  Right Carotid Findings: +----------+--------+-------+--------+--------------------------------+--------+           PSV cm/sEDV    StenosisPlaque Description              Comments                   cm/s                                                    +----------+--------+-------+--------+--------------------------------+--------+ CCA Prox  84      11                                                      +----------+--------+-------+--------+--------------------------------+--------+  CCA Distal73      12                                                      +----------+--------+-------+--------+--------------------------------+--------+ ICA Prox  53      12             smooth, calcific and                                                      heterogenous                             +----------+--------+-------+--------+--------------------------------+--------+ ICA Mid   82      18             heterogenous                             +----------+--------+-------+--------+--------------------------------+--------+ ICA Distal72      15                                                      +----------+--------+-------+--------+--------------------------------+--------+ ECA       139     17                                                      +----------+--------+-------+--------+--------------------------------+--------+ +----------+--------+-------+--------+-------------------+           PSV cm/sEDV cmsDescribeArm Pressure (mmHG) +----------+--------+-------+--------+-------------------+ Subclavian80                                         +----------+--------+-------+--------+-------------------+ +---------+--------+--+--------+--+ VertebralPSV cm/s57EDV cm/s12  +---------+--------+--+--------+--+  Left Carotid Findings: +----------+--------+--------+--------+-------------------------+--------+           PSV cm/sEDV cm/sStenosisPlaque Description       Comments +----------+--------+--------+--------+-------------------------+--------+ CCA Prox  120     13                                                +----------+--------+--------+--------+-------------------------+--------+ CCA Distal81      12                                                +----------+--------+--------+--------+-------------------------+--------+ ICA Prox  86      15              calcific and heterogenous         +----------+--------+--------+--------+-------------------------+--------+ ICA Mid   83  15                                                +----------+--------+--------+--------+-------------------------+--------+ ICA Distal82      22                                                +----------+--------+--------+--------+-------------------------+--------+ ECA       84      14                                                +----------+--------+--------+--------+-------------------------+--------+ +----------+--------+--------+--------+-------------------+           PSV cm/sEDV cm/sDescribeArm Pressure (mmHG) +----------+--------+--------+--------+-------------------+ [id number]                                         +----------+--------+--------+--------+-------------------+ +---------+--------+--+--------+--+ VertebralPSV cm/s52EDV cm/s10 +---------+--------+--+--------+--+   Summary: Right Carotid: Velocities in the right ICA are consistent with a 1-39% stenosis. Left Carotid: Velocities in the left ICA are consistent with a 1-39% stenosis. Vertebrals:  Bilateral vertebral arteries demonstrate antegrade flow. Subclavians: Normal flow hemodynamics were seen in bilateral subclavian              arteries. *See table(s) above  for measurements and observations.  Electronically signed by Heath Lark on 02/13/2023 at 9:23:12 PM.    Final    MR ANGIO HEAD WO CONTRAST Result Date: 02/13/2023 CLINICAL DATA:  Provided history: Stroke, follow-up. EXAM: MRA HEAD WITHOUT CONTRAST TECHNIQUE: Angiographic images of the Circle of Willis were acquired using MRA technique without intravenous contrast. COMPARISON:  Brain MRI 02/12/2023. Report from MRA head 10/08/2021 (images unavailable). FINDINGS: Anterior circulation: The intracranial internal carotid arteries are patent. The M1 middle cerebral arteries are patent. No M2 proximal branch occlusion defied. Severe stenosis within a proximal M2 left middle cerebral artery vessel (series 254, image 3). The anterior cerebral arteries are patent. 2 mm inferiorly projecting vascular protrusion arising from the cavernous left internal carotid artery, likely reflecting an aneurysm (series 254, image 15). Posterior circulation: The intracranial vertebral arteries are patent. The basilar artery is patent. The posterior cerebral arteries are patent. Posterior communicating arteries are diminutive or absent, bilaterally. Anatomic variants: As described. IMPRESSION: 1. No proximal intracranial large vessel occlusion identified. 2. Severe stenosis within a proximal M2 left middle cerebral artery vessel. 3. 2 mm inferiorly projecting vascular protrusion arising from the cavernous left internal carotid artery, likely reflecting an aneurysm. Electronically Signed   By: Jackey Loge D.O.   On: 02/13/2023 14:50   ECHOCARDIOGRAM COMPLETE Result Date: 02/13/2023    ECHOCARDIOGRAM REPORT   [name] Date of Exam: 02/13/2023 Medical Rec #:  [id number]       Height:       64.0 in Accession #:    [id number]      Weight:       123.5 lb Date of Birth:  [birth year]       BSA:  1.594 m Patient Age:    89 years        BP:           152/46 mmHg Patient Gender: F               HR:           62  bpm. Exam Location:  Inpatient Procedure: 2D Echo, Cardiac Doppler and Color Doppler Indications:    Stroke  History:        Patient has prior history of Echocardiogram examinations, most                 recent 08/07/2022. CAD, Prior CABG, PAD and Stroke; Risk                 Factors:Hypertension, Dyslipidemia and Diabetes. Chronic kidney                 disease.  Sonographer:    Vern Claude Referring Phys: [id number] RONDELL A SMITH IMPRESSIONS  1. Left ventricular ejection fraction, by estimation, is 60 to 65%. The left ventricle has normal function. The left ventricle has no regional wall motion abnormalities. Left ventricular diastolic parameters are consistent with Grade I diastolic dysfunction (impaired relaxation). Elevated left atrial pressure.  2. Right ventricular systolic function is normal. The right ventricular size is normal. There is normal pulmonary artery systolic pressure.  3. The mitral valve is normal in structure. Mild mitral valve regurgitation. No evidence of mitral stenosis. Moderate mitral annular calcification.  4. The aortic valve is tricuspid. Aortic valve regurgitation is not visualized. Aortic valve sclerosis/calcification is present, without any evidence of aortic stenosis.  5. The inferior vena cava is normal in size with greater than 50% respiratory variability, suggesting right atrial pressure of 3 mmHg. FINDINGS  Left Ventricle: Left ventricular ejection fraction, by estimation, is 60 to 65%. The left ventricle has normal function. The left ventricle has no regional wall motion abnormalities. The left ventricular internal cavity size was normal in size. There is  no left ventricular hypertrophy. Left ventricular diastolic parameters are consistent with Grade I diastolic dysfunction (impaired relaxation). Elevated left atrial pressure. Right Ventricle: The right ventricular size is normal. Right ventricular systolic function is normal. There is normal pulmonary artery systolic  pressure. The tricuspid regurgitant velocity is 2.50 m/s, and with an assumed right atrial pressure of 3 mmHg,  the estimated right ventricular systolic pressure is 28.0 mmHg. Left Atrium: Left atrial size was normal in size. Right Atrium: Right atrial size was normal in size. Pericardium: There is no evidence of pericardial effusion. Mitral Valve: The mitral valve is normal in structure. Moderate mitral annular calcification. Mild mitral valve regurgitation. No evidence of mitral valve stenosis. MV peak gradient, 9.0 mmHg. The mean mitral valve gradient is 3.0 mmHg. Tricuspid Valve: The tricuspid valve is normal in structure. Tricuspid valve regurgitation is mild . No evidence of tricuspid stenosis. Aortic Valve: The aortic valve is tricuspid. Aortic valve regurgitation is not visualized. Aortic valve sclerosis/calcification is present, without any evidence of aortic stenosis. Aortic valve mean gradient measures 3.0 mmHg. Aortic valve peak gradient measures 6.1 mmHg. Aortic valve area, by VTI measures 2.48 cm. Pulmonic Valve: The pulmonic valve was normal in structure. Pulmonic valve regurgitation is not visualized. No evidence of pulmonic stenosis. Aorta: The aortic root is normal in size and structure. Venous: The inferior vena cava is normal in size with greater than 50% respiratory variability, suggesting right atrial pressure of 3 mmHg. IAS/Shunts: No  atrial level shunt detected by color flow Doppler.  LEFT VENTRICLE PLAX 2D LVIDd:         3.80 cm      Diastology LVIDs:         2.80 cm      LV e' medial:    2.93 cm/s LV PW:         0.70 cm      LV E/e' medial:  26.3 LV IVS:        1.10 cm      LV e' lateral:   4.20 cm/s LVOT diam:     1.80 cm      LV E/e' lateral: 18.4 LV SV:         63 LV SV Index:   40 LVOT Area:     2.54 cm  LV Volumes (MOD) LV vol d, MOD A2C: 79.7 ml LV vol d, MOD A4C: 111.0 ml LV vol s, MOD A2C: 24.8 ml LV vol s, MOD A4C: 28.0 ml LV SV MOD A2C:     54.9 ml LV SV MOD A4C:     111.0 ml LV  SV MOD BP:      70.8 ml RIGHT VENTRICLE            IVC RV Basal diam:  2.80 cm    IVC diam: 1.20 cm RV Mid diam:    1.80 cm RV S prime:     8.93 cm/s TAPSE (M-mode): 1.9 cm LEFT ATRIUM             Index        RIGHT ATRIUM          Index LA diam:        2.90 cm 1.82 cm/m   RA Area:     6.26 cm LA Vol (A2C):   44.1 ml 27.67 ml/m  RA Volume:   9.75 ml  6.12 ml/m LA Vol (A4C):   23.5 ml 14.75 ml/m LA Biplane Vol: 32.4 ml 20.33 ml/m  AORTIC VALVE                    PULMONIC VALVE AV Area (Vmax):    2.13 cm     PV Vmax:       1.10 m/s AV Area (Vmean):   2.25 cm     PV Peak grad:  4.8 mmHg AV Area (VTI):     2.48 cm AV Vmax:           123.00 cm/s AV Vmean:          83.700 cm/s AV VTI:            0.255 m AV Peak Grad:      6.1 mmHg AV Mean Grad:      3.0 mmHg LVOT Vmax:         103.00 cm/s LVOT Vmean:        74.000 cm/s LVOT VTI:          0.249 m LVOT/AV VTI ratio: 0.98  AORTA Ao Root diam: 2.30 cm Ao Asc diam:  2.50 cm MITRAL VALVE                TRICUSPID VALVE MV Area (PHT): 1.83 cm     TR Peak grad:   25.0 mmHg MV Area VTI:   1.64 cm     TR Vmax:        250.00 cm/s MV Peak grad:  9.0 mmHg MV Mean grad:  3.0  mmHg     SHUNTS MV Vmax:       1.50 m/s     Systemic VTI:  0.25 m MV Vmean:      82.2 cm/s    Systemic Diam: 1.80 cm MV Decel Time: 415 msec MV E velocity: 77.10 cm/s MV A velocity: 149.00 cm/s MV E/A ratio:  0.52 Olga Millers MD Electronically signed by Olga Millers MD Signature Date/Time: 02/13/2023/1:43:40 PM    Final    MR BRAIN WO CONTRAST Result Date: 02/12/2023 CLINICAL DATA:  Provided history: Neuro deficit, acute, stroke suspected. Additional history provided: Recent fall. Increased weakness/numbness. Paresthesias and weakness on left side. EXAM: MRI HEAD WITHOUT CONTRAST MRI CERVICAL SPINE WITHOUT CONTRAST TECHNIQUE: Multiplanar, multiecho pulse sequences of the brain and surrounding structures, and cervical spine, to include the craniocervical junction and cervicothoracic junction, were  obtained without intravenous contrast. COMPARISON:  Head CT 02/12/2023. Brain MRI 08/06/2022. Cervical spine MRI 08/06/2022. FINDINGS: MRI HEAD FINDINGS Brain: Generalized cerebral atrophy. 11 mm acute infarct at the right thalamocapsular junction. Chronic lacunar infarcts again noted within the bilateral cerebral hemispheric white matter and within/about the bilateral deep gray nuclei. Background advanced multifocal T2 FLAIR hyperintense signal abnormality within the cerebral white matter, nonspecific but compatible with chronic small vessel ischemic disease. Minimal chronic small vessel ischemic changes also noted within the pons. No evidence of an intracranial mass. No extra-axial fluid collection. No midline shift. Vascular: Maintained flow voids within the proximal large arterial vessels. Skull and upper cervical spine: No focal worrisome marrow lesion. Sinuses/Orbits: No mass or acute finding within the imaged orbits. Prior bilateral ocular lens replacement. No significant paranasal sinus disease. MRI CERVICAL SPINE FINDINGS The axial T2 sequences are moderately motion degraded. Within this limitation, findings are as follows. Alignment: Grade 1 anterolisthesis at C3-C4, C4-C5, C7-T1, T1-T2 and T2-T3. Vertebrae: Cervical vertebral body height is maintained. Vertebral body and left-sided facet ankylosis at C5-C6. Moderate to prominent marrow edema at C6-C7, progressed from the prior cervical spine MRI of 08/06/2022 and likely degenerative. Degenerative marrow edema also again noted at the C1-C2 level. Cord: Within limitations of motion degradation, no signal abnormality is identified within the cervical spinal cord. Posterior Fossa, vertebral arteries, paraspinal tissues: Posterior fossa assessed on concurrently performed brain MRI. Flow voids preserved within the imaged cervical vertebral arteries. No paraspinal mass or collection. Disc levels: Unless otherwise stated, the level by level findings below have  not significantly changed from the prior MRI of 08/06/2022. Multilevel disc degeneration. At the non-fused levels, disc degeneration is greatest at C3-C4, C4-C5 and C7-T1 (advanced at these levels). C1-C2: Ligamentous hypertrophy posterior to the dens. Ligamentum flavum thickening. Mild-to-moderate spinal canal stenosis with mild spinal cord flattening. C2-C3: Posterior disc osteophyte complex with right greater than left disc osteophyte ridge/uncinate hypertrophy. Facet arthropathy (greater on the left). Ligamentum flavum thickening. Mild spinal canal narrowing. Moderate right neural foraminal narrowing. C3-C4: Grade 1 anterolisthesis. Disc uncovering. Posterior disc osteophyte complex with right-sided disc osteophyte ridge/uncinate hypertrophy. Facet arthropathy (greater on the left). Ligamentum flavum thickening. Mild-to-moderate spinal canal stenosis. Bilateral neural foraminal narrowing (severe right, moderate left). C4-C5: Grade 1 anterolisthesis. Disc uncovering. Posterior disc osteophyte complex with bilateral disc osteophyte ridge/uncinate hypertrophy. Facet arthropathy. Ligamentum flavum thickening. Mild-to-moderate spinal canal stenosis. Bilateral neural foraminal narrowing (severe right, moderate left). C5-C6: Vertebral ankylosis. Vertebral osteophytosis. Bilateral uncovertebral hypertrophy. No significant spinal canal stenosis. Bilateral neural foraminal narrowing (moderate right, mild left). C6-C7: Posterior disc osteophyte complex with bilateral disc osteophyte ridge/uncinate hypertrophy. Facet arthropathy (greater on  the right). Ligamentum flavum thickening. Mild-to-moderate spinal canal stenosis. Severe bilateral neural foraminal narrowing. C7-T1: Grade 1 anterolisthesis. Disc uncovering with disc bulge. Facet arthropathy (greater on the left). Mild spinal canal stenosis. Severe bilateral neural foraminal narrowing. T1-T2: Grade 1 anterolisthesis. Disc uncovering with disc bulge. Facet arthropathy.  Ligamentum flavum thickening. Mild spinal canal narrowing. Bilateral neural foraminal narrowing (moderate right, mild left). IMPRESSION: MRI brain: 1. 11 mm acute infarct at the right thalamocapsular junction. 2. Background parenchymal atrophy, advanced chronic small vessel ischemic disease and chronic lacunar infarcts as described. MRI cervical spine: 1. Motion degraded exam. 2. Spondylosis at the cervical and visualized upper thoracic levels as outlined within the body of the report. Moderate-to-prominent marrow edema within the C6 and C7 vertebral bodies, likely degenerative and progressed from the prior MRI of 08/06/2022. Cervical spondylosis has otherwise not significantly changed from this prior study. 3. Spinal canal stenosis is greatest at C1-C2, C3-C4, C4-C5 and C6-C7 (mild-to-moderate at these levels). 4. Multilevel foraminal stenosis. Most notably, severe foraminal stenosis is present on the right at C3-C4, on the right at C4-C5, bilaterally at C6-C7 and bilaterally at C7-T1. 5. Disc degeneration is advanced at C3-C4, C4-C5 and C7-T1. 6. C5-C6 vertebral ankylosis. Electronically Signed   By: Jackey Loge D.O.   On: 02/12/2023 15:30   MR Cervical Spine Wo Contrast Result Date: 02/12/2023 CLINICAL DATA:  Provided history: Neuro deficit, acute, stroke suspected. Additional history provided: Recent fall. Increased weakness/numbness. Paresthesias and weakness on left side. EXAM: MRI HEAD WITHOUT CONTRAST MRI CERVICAL SPINE WITHOUT CONTRAST TECHNIQUE: Multiplanar, multiecho pulse sequences of the brain and surrounding structures, and cervical spine, to include the craniocervical junction and cervicothoracic junction, were obtained without intravenous contrast. COMPARISON:  Head CT 02/12/2023. Brain MRI 08/06/2022. Cervical spine MRI 08/06/2022. FINDINGS: MRI HEAD FINDINGS Brain: Generalized cerebral atrophy. 11 mm acute infarct at the right thalamocapsular junction. Chronic lacunar infarcts again noted  within the bilateral cerebral hemispheric white matter and within/about the bilateral deep gray nuclei. Background advanced multifocal T2 FLAIR hyperintense signal abnormality within the cerebral white matter, nonspecific but compatible with chronic small vessel ischemic disease. Minimal chronic small vessel ischemic changes also noted within the pons. No evidence of an intracranial mass. No extra-axial fluid collection. No midline shift. Vascular: Maintained flow voids within the proximal large arterial vessels. Skull and upper cervical spine: No focal worrisome marrow lesion. Sinuses/Orbits: No mass or acute finding within the imaged orbits. Prior bilateral ocular lens replacement. No significant paranasal sinus disease. MRI CERVICAL SPINE FINDINGS The axial T2 sequences are moderately motion degraded. Within this limitation, findings are as follows. Alignment: Grade 1 anterolisthesis at C3-C4, C4-C5, C7-T1, T1-T2 and T2-T3. Vertebrae: Cervical vertebral body height is maintained. Vertebral body and left-sided facet ankylosis at C5-C6. Moderate to prominent marrow edema at C6-C7, progressed from the prior cervical spine MRI of 08/06/2022 and likely degenerative. Degenerative marrow edema also again noted at the C1-C2 level. Cord: Within limitations of motion degradation, no signal abnormality is identified within the cervical spinal cord. Posterior Fossa, vertebral arteries, paraspinal tissues: Posterior fossa assessed on concurrently performed brain MRI. Flow voids preserved within the imaged cervical vertebral arteries. No paraspinal mass or collection. Disc levels: Unless otherwise stated, the level by level findings below have not significantly changed from the prior MRI of 08/06/2022. Multilevel disc degeneration. At the non-fused levels, disc degeneration is greatest at C3-C4, C4-C5 and C7-T1 (advanced at these levels). C1-C2: Ligamentous hypertrophy posterior to the dens. Ligamentum flavum thickening.  Mild-to-moderate spinal canal  stenosis with mild spinal cord flattening. C2-C3: Posterior disc osteophyte complex with right greater than left disc osteophyte ridge/uncinate hypertrophy. Facet arthropathy (greater on the left). Ligamentum flavum thickening. Mild spinal canal narrowing. Moderate right neural foraminal narrowing. C3-C4: Grade 1 anterolisthesis. Disc uncovering. Posterior disc osteophyte complex with right-sided disc osteophyte ridge/uncinate hypertrophy. Facet arthropathy (greater on the left). Ligamentum flavum thickening. Mild-to-moderate spinal canal stenosis. Bilateral neural foraminal narrowing (severe right, moderate left). C4-C5: Grade 1 anterolisthesis. Disc uncovering. Posterior disc osteophyte complex with bilateral disc osteophyte ridge/uncinate hypertrophy. Facet arthropathy. Ligamentum flavum thickening. Mild-to-moderate spinal canal stenosis. Bilateral neural foraminal narrowing (severe right, moderate left). C5-C6: Vertebral ankylosis. Vertebral osteophytosis. Bilateral uncovertebral hypertrophy. No significant spinal canal stenosis. Bilateral neural foraminal narrowing (moderate right, mild left). C6-C7: Posterior disc osteophyte complex with bilateral disc osteophyte ridge/uncinate hypertrophy. Facet arthropathy (greater on the right). Ligamentum flavum thickening. Mild-to-moderate spinal canal stenosis. Severe bilateral neural foraminal narrowing. C7-T1: Grade 1 anterolisthesis. Disc uncovering with disc bulge. Facet arthropathy (greater on the left). Mild spinal canal stenosis. Severe bilateral neural foraminal narrowing. T1-T2: Grade 1 anterolisthesis. Disc uncovering with disc bulge. Facet arthropathy. Ligamentum flavum thickening. Mild spinal canal narrowing. Bilateral neural foraminal narrowing (moderate right, mild left). IMPRESSION: MRI brain: 1. 11 mm acute infarct at the right thalamocapsular junction. 2. Background parenchymal atrophy, advanced chronic small vessel ischemic  disease and chronic lacunar infarcts as described. MRI cervical spine: 1. Motion degraded exam. 2. Spondylosis at the cervical and visualized upper thoracic levels as outlined within the body of the report. Moderate-to-prominent marrow edema within the C6 and C7 vertebral bodies, likely degenerative and progressed from the prior MRI of 08/06/2022. Cervical spondylosis has otherwise not significantly changed from this prior study. 3. Spinal canal stenosis is greatest at C1-C2, C3-C4, C4-C5 and C6-C7 (mild-to-moderate at these levels). 4. Multilevel foraminal stenosis. Most notably, severe foraminal stenosis is present on the right at C3-C4, on the right at C4-C5, bilaterally at C6-C7 and bilaterally at C7-T1. 5. Disc degeneration is advanced at C3-C4, C4-C5 and C7-T1. 6. C5-C6 vertebral ankylosis. Electronically Signed   By: Jackey Loge D.O.   On: 02/12/2023 15:30   CT HEAD WO CONTRAST Result Date: 02/12/2023 CLINICAL DATA:  Neuro deficit, acute, stroke suspected. Fall. No visible injuries. EXAM: CT HEAD WITHOUT CONTRAST TECHNIQUE: Contiguous axial images were obtained from the base of the skull through the vertex without intravenous contrast. RADIATION DOSE REDUCTION: This exam was performed according to the departmental dose-optimization program which includes automated exposure control, adjustment of the mA and/or kV according to patient size and/or use of iterative reconstruction technique. COMPARISON:  CT head without contrast 08/06/2022. MR head without contrast 08/06/2022. FINDINGS: Brain: Advanced atrophy and white matter disease is similar the prior exam. Remote lacunar infarcts of the basal ganglia and corona radiata bilaterally are stable. No acute infarct, hemorrhage, or mass lesion is present. The ventricles are of normal size. No significant extraaxial fluid collection is present. The brainstem and cerebellum are within normal limits. Midline structures are within normal limits. Vascular:  Atherosclerotic calcifications are present within the cavernous internal carotid arteries and at the dural margin of both vertebral arteries. Skull: Calvarium is intact. No focal lytic or blastic lesions are present. No significant extracranial soft tissue lesion is present. Sinuses/Orbits: The paranasal sinuses and mastoid air cells are clear. The globes and orbits are within normal limits. IMPRESSION: 1. No acute intracranial abnormality or significant interval change. 2. Advanced atrophy and white matter disease likely reflects the sequela of chronic  microvascular ischemia. 3. Remote lacunar infarcts of the basal ganglia and corona radiata bilaterally are stable. Electronically Signed   By: Marin Roberts M.D.   On: 02/12/2023 13:15   DG Chest Portable 1 View Result Date: 02/12/2023 CLINICAL DATA:  Left-sided weakness and numbness.  Fell yesterday. EXAM: PORTABLE CHEST 1 VIEW COMPARISON:  Chest x-ray dated June 11, 2014. FINDINGS: The patient is rotated to the left. Stable cardiomediastinal silhouette with normal heart size status post CABG. Normal pulmonary vascularity. No focal consolidation, pleural effusion, or pneumothorax. No acute osseous abnormality. IMPRESSION: No active disease. Electronically Signed   By: Obie Dredge M.D.   On: 02/12/2023 12:24        Scheduled Meds:  amLODipine  2.5 mg Oral Daily   aspirin EC  81 mg Oral Daily   carvedilol  12.5 mg Oral BID WC   clopidogrel  75 mg Oral Daily   empagliflozin  10 mg Oral Daily   heparin  5,000 Units Subcutaneous Q8H   pantoprazole  40 mg Oral Daily   rosuvastatin  10 mg Oral Daily   Continuous Infusions:     LOS: 1 day     Huey Bienenstock, MD [facility]   To contact the attending provider between 7A-7P or the covering provider during after hours 7P-7A, please log into the web site [email or web address] and access using universal [place] password for that web site. If you do not have the password, please  call the hospital operator.  02/14/2023, 11:23 AM

## 2023-02-15 DIAGNOSIS — I63311 Cerebral infarction due to thrombosis of right middle cerebral artery: Secondary | ICD-10-CM | POA: Diagnosis not present

## 2023-02-15 DIAGNOSIS — I63331 Cerebral infarction due to thrombosis of right posterior cerebral artery: Secondary | ICD-10-CM | POA: Diagnosis not present

## 2023-02-15 MED ORDER — AMLODIPINE BESYLATE 5 MG PO TABS
5.0000 mg | ORAL_TABLET | Freq: Every day | ORAL | Status: DC
  Administered 2023-02-15 – 2023-02-16 (×2): 5 mg via ORAL
  Filled 2023-02-15 (×2): qty 1

## 2023-02-15 MED ORDER — IBUPROFEN 600 MG PO TABS
600.0000 mg | ORAL_TABLET | Freq: Once | ORAL | Status: AC
  Administered 2023-02-16: 600 mg via ORAL
  Filled 2023-02-15: qty 1

## 2023-02-15 NOTE — TOC Progression Note (Addendum)
Transition of Care [facility]) - Progression Note    Patient Details  Name: [name] MRN: [id number] Date of Birth: Feb 27, 1927  [facility] CM/SW Contact  Donnalee Curry, LCSWA Phone Number: 02/15/2023, 10:29 AM  Clinical Narrative:     SW met with pt and son Adele Schilder [phone]) at bedside to discuss bed offers. Pt preferred [name] and son Adele Schilder is able to transport.  SW spoke with [facility] [phone]) to accept offer and confirmed bed still available.   Auth approved [facility] #[id number], 12/30 to 02/18/23   Expected Discharge Plan: Skilled Nursing Facility Barriers to Discharge: Continued Medical Work up, SNF Pending bed offer, English as a second language teacher  Expected Discharge Plan and Services In-house Referral: Clinical Social Work   Post Acute Care Choice: Skilled Nursing Facility Living arrangements for the past 2 months: Single Family Home                                       Social Determinants of Health (SDOH) Interventions SDOH Screenings   Food Insecurity: No Food Insecurity (02/13/2023)  Housing: Unknown (02/13/2023)  Transportation Needs: No Transportation Needs (02/13/2023)  Utilities: Not At Risk (02/13/2023)  Alcohol Screen: Low Risk  (11/14/2020)  Depression (PHQ2-9): Low Risk  (03/27/2022)  Financial Resource Strain: Low Risk  (03/27/2022)  Physical Activity: Inactive (03/27/2022)  Social Connections: Moderately Integrated (11/14/2020)  Stress: No Stress Concern Present (03/27/2022)  Tobacco Use: Low Risk  (02/12/2023)    Readmission Risk Interventions     No data to display

## 2023-02-15 NOTE — Plan of Care (Signed)
Problem: Education: Goal: Knowledge of disease or condition will improve 02/15/2023 1830 by Ellison Carwin, RN Outcome: Progressing 02/15/2023 1830 by Ellison Carwin, RN Outcome: Progressing Goal: Knowledge of secondary prevention will improve (MUST DOCUMENT ALL) 02/15/2023 1830 by Ellison Carwin, RN Outcome: Progressing 02/15/2023 1830 by Ellison Carwin, RN Outcome: Progressing Goal: Knowledge of patient specific risk factors will improve Loraine Leriche N/A or DELETE if not current risk factor) 02/15/2023 1830 by Ellison Carwin, RN Outcome: Progressing 02/15/2023 1830 by Ellison Carwin, RN Outcome: Progressing   Problem: Ischemic Stroke/TIA Tissue Perfusion: Goal: Complications of ischemic stroke/TIA will be minimized 02/15/2023 1830 by Ellison Carwin, RN Outcome: Progressing 02/15/2023 1830 by Ellison Carwin, RN Outcome: Progressing   Problem: Coping: Goal: Will verbalize positive feelings about self 02/15/2023 1830 by Ellison Carwin, RN Outcome: Progressing 02/15/2023 1830 by Ellison Carwin, RN Outcome: Progressing Goal: Will identify appropriate support needs 02/15/2023 1830 by Ellison Carwin, RN Outcome: Progressing 02/15/2023 1830 by Ellison Carwin, RN Outcome: Progressing   Problem: Health Behavior/Discharge Planning: Goal: Ability to manage health-related needs will improve 02/15/2023 1830 by Ellison Carwin, RN Outcome: Progressing 02/15/2023 1830 by Ellison Carwin, RN Outcome: Progressing Goal: Goals will be collaboratively established with patient/family 02/15/2023 1830 by Ellison Carwin, RN Outcome: Progressing 02/15/2023 1830 by Ellison Carwin, RN Outcome: Progressing   Problem: Self-Care: Goal: Ability to participate in self-care as condition permits will improve 02/15/2023 1830 by Ellison Carwin, RN Outcome: Progressing 02/15/2023 1830 by Ellison Carwin, RN Outcome: Progressing Goal: Verbalization of feelings and  concerns over difficulty with self-care will improve 02/15/2023 1830 by Ellison Carwin, RN Outcome: Progressing 02/15/2023 1830 by Ellison Carwin, RN Outcome: Progressing Goal: Ability to communicate needs accurately will improve 02/15/2023 1830 by Ellison Carwin, RN Outcome: Progressing 02/15/2023 1830 by Ellison Carwin, RN Outcome: Progressing   Problem: Nutrition: Goal: Risk of aspiration will decrease 02/15/2023 1830 by Ellison Carwin, RN Outcome: Progressing 02/15/2023 1830 by Ellison Carwin, RN Outcome: Progressing Goal: Dietary intake will improve 02/15/2023 1830 by Ellison Carwin, RN Outcome: Progressing 02/15/2023 1830 by Ellison Carwin, RN Outcome: Progressing   Problem: Education: Goal: Knowledge of General Education information will improve Description: Including pain rating scale, medication(s)/side effects and non-pharmacologic comfort measures 02/15/2023 1830 by Ellison Carwin, RN Outcome: Progressing 02/15/2023 1830 by Ellison Carwin, RN Outcome: Progressing   Problem: Health Behavior/Discharge Planning: Goal: Ability to manage health-related needs will improve 02/15/2023 1830 by Ellison Carwin, RN Outcome: Progressing 02/15/2023 1830 by Ellison Carwin, RN Outcome: Progressing   Problem: Clinical Measurements: Goal: Ability to maintain clinical measurements within normal limits will improve 02/15/2023 1830 by Ellison Carwin, RN Outcome: Progressing 02/15/2023 1830 by Ellison Carwin, RN Outcome: Progressing Goal: Will remain free from infection 02/15/2023 1830 by Ellison Carwin, RN Outcome: Progressing 02/15/2023 1830 by Ellison Carwin, RN Outcome: Progressing Goal: Diagnostic test results will improve 02/15/2023 1830 by Ellison Carwin, RN Outcome: Progressing 02/15/2023 1830 by Ellison Carwin, RN Outcome: Progressing Goal: Respiratory complications will improve 02/15/2023 1830 by Ellison Carwin, RN Outcome:  Progressing 02/15/2023 1830 by Ellison Carwin, RN Outcome: Progressing Goal: Cardiovascular complication will be avoided 02/15/2023 1830 by Ellison Carwin, RN Outcome: Progressing 02/15/2023 1830 by Ellison Carwin, RN Outcome: Progressing   Problem: Activity: Goal: Risk for activity intolerance will decrease 02/15/2023 1830 by Ellison Carwin, RN Outcome: Progressing 02/15/2023 1830 by Ellison Carwin, RN Outcome: Progressing   Problem: Nutrition: Goal: Adequate nutrition will be maintained 02/15/2023 1830 by Ellison Carwin, RN Outcome: Progressing 02/15/2023 1830 by Ellison Carwin, RN Outcome: Progressing   Problem: Coping: Goal: Level of  anxiety will decrease 02/15/2023 1830 by Ellison Carwin, RN Outcome: Progressing 02/15/2023 1830 by Ellison Carwin, RN Outcome: Progressing   Problem: Elimination: Goal: Will not experience complications related to bowel motility 02/15/2023 1830 by Ellison Carwin, RN Outcome: Progressing 02/15/2023 1830 by Ellison Carwin, RN Outcome: Progressing Goal: Will not experience complications related to urinary retention 02/15/2023 1830 by Ellison Carwin, RN Outcome: Progressing 02/15/2023 1830 by Ellison Carwin, RN Outcome: Progressing   Problem: Pain Management: Goal: General experience of comfort will improve 02/15/2023 1830 by Ellison Carwin, RN Outcome: Progressing 02/15/2023 1830 by Ellison Carwin, RN Outcome: Progressing   Problem: Safety: Goal: Ability to remain free from injury will improve 02/15/2023 1830 by Ellison Carwin, RN Outcome: Progressing 02/15/2023 1830 by Ellison Carwin, RN Outcome: Progressing   Problem: Skin Integrity: Goal: Risk for impaired skin integrity will decrease 02/15/2023 1830 by Ellison Carwin, RN Outcome: Progressing 02/15/2023 1830 by Ellison Carwin, RN Outcome: Progressing

## 2023-02-15 NOTE — Plan of Care (Signed)
°  Problem: Education: Goal: Knowledge of disease or condition will improve Outcome: Progressing   Problem: Coping: Goal: Will verbalize positive feelings about self Outcome: Progressing   Problem: Health Behavior/Discharge Planning: Goal: Ability to manage health-related needs will improve Outcome: Progressing   Problem: Self-Care: Goal: Ability to participate in self-care as condition permits will improve Outcome: Progressing   Problem: Nutrition: Goal: Risk of aspiration will decrease Outcome: Progressing

## 2023-02-15 NOTE — Progress Notes (Signed)
PROGRESS NOTE    Rhilynn Gali  WUJ:[id number] DOB: 1927-03-10 DOA: 02/12/2023 PCP: Nona Dell, NP    Chief Complaint  Patient presents with   Fall    Brief Narrative:   Syndey Frankson is a 89 y.o. female with medical history significant of hypertension, CAD s/p CABG, CVA, diabetes mellitus type 2, rheumatoid arthritis presents with sudden bilateral loss of sensation in the hands and legs, leading to a fall.patient had been hospitalized in June of this year after presenting with left upper extremity weakness.  MRI of the brain was negative at that time, but neurology felt clinical syndrome was compelling and likely an MRI negative stroke after stopping aspirin.  -MRI brain was significant for acute CVA, she is admitted for further workup.  She was seen by neurology, recommendation for dual antiplatelet therapy aspirin and Plavix, then Plavix alone.  Patient is medically cleared for discharge, awaiting SNF bed availability, hopefully to [facility] tomorrow as insurance Auth start from tomorrow   Assessment & Plan:   Principal Problem:   CVA (cerebral vascular accident) (HCC) Active Problems:   DM type 2 (diabetes mellitus, type 2) (HCC)   Essential hypertension   Hyperlipidemia   CKD (chronic kidney disease), stage IV (HCC)   Anemia of chronic disease   Arthritis of right hip   Acute CVA (cerebrovascular accident) (HCC)  Acute CVA -MRI significant for 11 mm acute infarct in the right  thalamocapsular junction.   -Neurology input greatly appreciated, it was felt secondary to small vessel disease, he was on aspirin prior to admission, recommendation for dual antiplatelet therapy aspirin and Plavix for 3 weeks, then Plavix alone. -2D echo with preserved EF 60 to 65% -A1c acceptable at 6.6 -LDL is 95 back on statin -PT/OT consult greatly appreciated, recommendation for SNF, when bed is available, awaiting insurance approval and bed availability   Controlled diabetes  mellitus type 2, without long-term use of insulin -A1c is acceptable at 6.6, continue with Jardiance  Essential hypertension Home blood pressure regimen appears to include amlodipine 5 mg daily and Coreg 12.5 mg twice daily. -Normotension currently per neurology, back on Coreg and amlodipine, but blood pressure soft this morning so we will decrease amlodipine to 2.5 mg p.o. daily   Hyperlipidemia Patient was started on Crestor during last hospitalization, but does not appear to be on her current medication list. -LDL is  95, back on Crestor   Chronic kidney disease stage IV Creatinine 2.16 with BUN 57 which appears around patient's baseline.    Anemia of chronic disease Chronic.  Hemoglobin 11.8 which appears around patient's baseline. -Continue to monitor   Right hip pain Arthritis of the right hip Lumbar radiculopathy-sciatica Chronic.  Patient reports having right hip pain for which she has previously been seen by orthopedics for radiculopathy and osteoarthritis of the hip.  Patient reports having hallucinations with tramadol. -Continue Tylenol and warm compresses as needed   Rheumatoid arthritis -Patient reports she just finished a course of 10 days prednisone    DVT prophylaxis: [place] heparin Code Status: DNR Family Communication: none at bedside, updated son Vanita Ingles by phone  Disposition: SNF, likely [facility] tomorrow as insurance authorization start from 12/30  Status is: Inpatient    Consultants:  Neurology   Subjective:  No significant events overnight, she denies any complaints today  Objective: Vitals:   02/15/23 0332 02/15/23 0840 02/15/23 0850 02/15/23 1138  BP: (!) 146/58 (!) 147/71 (!) 147/71 (!) 141/55  Pulse: (!) 59 61 (!) 57 61  Resp: 16 15  13   Temp: 97.9 F (36.6 C) 98.1 F (36.7 C)  98 F (36.7 C)  TempSrc: Oral Oral  Oral  SpO2: 100% 100%    Weight:      Height:        Intake/Output Summary (Last 24 hours) at 02/15/2023 1212 Last data  filed at 02/14/2023 2100 Gross per 24 hour  Intake 120 ml  Output 900 ml  Net -780 ml    Filed Weights   02/12/23 1131  Weight: 56 kg    Examination:  Awake Alert, Oriented X 3, frail Symmetrical Chest wall movement, Good air movement bilaterally, CTAB RRR,No Gallops,Rubs or new Murmurs, No Parasternal Heave +ve B.Sounds, Abd Soft, No tenderness, No rebound - guarding or rigidity. No Cyanosis, Clubbing or edema, No new Rash or bruise       Data Reviewed: I have personally reviewed following labs and imaging studies  CBC: Recent Labs  Lab 02/12/23 1124 02/12/23 1159 02/14/23 1202  WBC 7.9  --  6.1  NEUTROABS 6.0  --   --   HGB 11.8* 12.2 11.2*  HCT 37.1 36.0 34.1*  MCV 97.9  --  95.3  PLT 219  --  200    Basic Metabolic Panel: Recent Labs  Lab 02/12/23 1124 02/12/23 1159 02/14/23 1202  NA 139 141 136  K 4.4 4.1 4.2  CL 104 106 104  CO2 23  --  22  GLUCOSE 196* 159* 201*  BUN 57* 55* 58*  CREATININE 2.16* 2.20* 2.23*  CALCIUM 9.0  --  8.8*  MG 2.2  --  2.1  PHOS  --   --  3.4    GFR: Estimated Creatinine Clearance: 13.6 mL/min (A) (by C-G formula based on SCr of 2.23 mg/dL (H)).  Liver Function Tests: Recent Labs  Lab 02/12/23 1124  AST 28  ALT 15  ALKPHOS 77  BILITOT 0.5  PROT 6.6  ALBUMIN 3.3*    CBG: Recent Labs  Lab 02/12/23 1124 02/13/23 0821  GLUCAP 176* 126*     No results found for this or any previous visit (from the past 240 hours).       Radiology Studies: VAS US CAROTID Result Date: 02/13/2023 Carotid Arterial Duplex Study Patient Name:  Chamara Flow  Date of Exam:   02/13/2023 Medical Rec #: [id number]        Accession #:    [id number] Date of Birth: 1927/09/03        Patient Gender: F Patient Age:   16 years Exam Location:  [facility] Procedure:      VAS US CAROTID Referring Phys: Scheryl Marten XU --------------------------------------------------------------------------------  Indications:       CVA and S/P  CABG 2003. Risk Factors:      Hypertension, Diabetes, coronary artery disease, prior CVA. Comparison Study:  Previous study on 6.20.2024. Performing Technologist: Fernande Bras  Examination Guidelines: A complete evaluation includes B-mode imaging, spectral Doppler, color Doppler, and power Doppler as needed of all accessible portions of each vessel. Bilateral testing is considered an integral part of a complete examination. Limited examinations for reoccurring indications may be performed as noted.  Right Carotid Findings: +----------+--------+-------+--------+--------------------------------+--------+           PSV cm/sEDV    StenosisPlaque Description              Comments                   cm/s                                                    +----------+--------+-------+--------+--------------------------------+--------+  CCA Prox  84      11                                                      +----------+--------+-------+--------+--------------------------------+--------+ CCA Distal73      12                                                      +----------+--------+-------+--------+--------------------------------+--------+ ICA Prox  53      12             smooth, calcific and                                                      heterogenous                             +----------+--------+-------+--------+--------------------------------+--------+ ICA Mid   82      18             heterogenous                             +----------+--------+-------+--------+--------------------------------+--------+ ICA Distal72      15                                                      +----------+--------+-------+--------+--------------------------------+--------+ ECA       139     17                                                      +----------+--------+-------+--------+--------------------------------+--------+  +----------+--------+-------+--------+-------------------+           PSV cm/sEDV cmsDescribeArm Pressure (mmHG) +----------+--------+-------+--------+-------------------+ Subclavian80                                         +----------+--------+-------+--------+-------------------+ +---------+--------+--+--------+--+ VertebralPSV cm/s57EDV cm/s12 +---------+--------+--+--------+--+  Left Carotid Findings: +----------+--------+--------+--------+-------------------------+--------+           PSV cm/sEDV cm/sStenosisPlaque Description       Comments +----------+--------+--------+--------+-------------------------+--------+ CCA Prox  120     13                                                +----------+--------+--------+--------+-------------------------+--------+ CCA Distal81      12                                                +----------+--------+--------+--------+-------------------------+--------+  ICA Prox  86      15              calcific and heterogenous         +----------+--------+--------+--------+-------------------------+--------+ ICA Mid   83      15                                                +----------+--------+--------+--------+-------------------------+--------+ ICA Distal82      22                                                +----------+--------+--------+--------+-------------------------+--------+ ECA       84      14                                                +----------+--------+--------+--------+-------------------------+--------+ +----------+--------+--------+--------+-------------------+           PSV cm/sEDV cm/sDescribeArm Pressure (mmHG) +----------+--------+--------+--------+-------------------+ [id number]                                         +----------+--------+--------+--------+-------------------+ +---------+--------+--+--------+--+ VertebralPSV cm/s52EDV cm/s10  +---------+--------+--+--------+--+   Summary: Right Carotid: Velocities in the right ICA are consistent with a 1-39% stenosis. Left Carotid: Velocities in the left ICA are consistent with a 1-39% stenosis. Vertebrals:  Bilateral vertebral arteries demonstrate antegrade flow. Subclavians: Normal flow hemodynamics were seen in bilateral subclavian              arteries. *See table(s) above for measurements and observations.  Electronically signed by Heath Lark on 02/13/2023 at 9:23:12 PM.    Final         Scheduled Meds:  amLODipine  5 mg Oral Daily   aspirin EC  81 mg Oral Daily   carvedilol  12.5 mg Oral BID WC   clopidogrel  75 mg Oral Daily   empagliflozin  10 mg Oral Daily   heparin  5,000 Units Subcutaneous Q8H   pantoprazole  40 mg Oral Daily   rosuvastatin  10 mg Oral Daily   Continuous Infusions:     LOS: 1 day     Huey Bienenstock, MD [facility]   To contact the attending provider between 7A-7P or the covering provider during after hours 7P-7A, please log into the web site [email or web address] and access using universal [place] password for that web site. If you do not have the password, please call the hospital operator.  02/15/2023, 12:12 PM

## 2023-02-15 NOTE — Plan of Care (Signed)
  Problem: Education: Goal: Knowledge of disease or condition will improve Outcome: Progressing Goal: Knowledge of secondary prevention will improve Outcome: Progressing Goal: Knowledge of patient specific risk factors will improve Outcome: Progressing   Problem: Ischemic Stroke/TIA Tissue Perfusion: Goal: Complications of ischemic stroke/TIA will be minimized Outcome: Progressing   Problem: Coping: Goal: Will verbalize positive feelings about self Outcome: Progressing Goal: Will identify appropriate support needs Outcome: Progressing   Problem: Health Behavior/Discharge Planning: Goal: Ability to manage health-related needs will improve Outcome: Progressing Goal: Goals will be collaboratively established with patient/family Outcome: Progressing   Problem: Self-Care: Goal: Ability to participate in self-care as condition permits will improve Outcome: Progressing Goal: Verbalization of feelings and concerns over difficulty with self-care will improve Outcome: Progressing Goal: Ability to communicate needs accurately will improve Outcome: Progressing   Problem: Nutrition: Goal: Risk of aspiration will decrease Outcome: Progressing Goal: Dietary intake will improve Outcome: Progressing   Problem: Education: Goal: Knowledge of General Education information will improve Description: Including pain rating scale, medication(s)/side effects and non-pharmacologic comfort measures Outcome: Progressing   Problem: Health Behavior/Discharge Planning: Goal: Ability to manage health-related needs will improve Outcome: Progressing   Problem: Clinical Measurements: Goal: Ability to maintain clinical measurements within normal limits will improve Outcome: Progressing Goal: Will remain free from infection Outcome: Progressing Goal: Diagnostic test results will improve Outcome: Progressing Goal: Respiratory complications will improve Outcome: Progressing Goal: Cardiovascular  complication will be avoided Outcome: Progressing   Problem: Activity: Goal: Risk for activity intolerance will decrease Outcome: Progressing   Problem: Nutrition: Goal: Adequate nutrition will be maintained Outcome: Progressing   Problem: Coping: Goal: Level of anxiety will decrease Outcome: Progressing   Problem: Elimination: Goal: Will not experience complications related to bowel motility Outcome: Progressing Goal: Will not experience complications related to urinary retention Outcome: Progressing   Problem: Pain Management: Goal: General experience of comfort will improve Outcome: Progressing   Problem: Safety: Goal: Ability to remain free from injury will improve Outcome: Progressing   Problem: Skin Integrity: Goal: Risk for impaired skin integrity will decrease Outcome: Progressing

## 2023-02-16 ENCOUNTER — Observation Stay (HOSPITAL_COMMUNITY): Payer: Medicare PPO

## 2023-02-16 DIAGNOSIS — M069 Rheumatoid arthritis, unspecified: Secondary | ICD-10-CM

## 2023-02-16 DIAGNOSIS — I63331 Cerebral infarction due to thrombosis of right posterior cerebral artery: Secondary | ICD-10-CM | POA: Diagnosis not present

## 2023-02-16 DIAGNOSIS — E119 Type 2 diabetes mellitus without complications: Secondary | ICD-10-CM

## 2023-02-16 DIAGNOSIS — D638 Anemia in other chronic diseases classified elsewhere: Secondary | ICD-10-CM | POA: Diagnosis not present

## 2023-02-16 DIAGNOSIS — I63311 Cerebral infarction due to thrombosis of right middle cerebral artery: Secondary | ICD-10-CM | POA: Diagnosis not present

## 2023-02-16 DIAGNOSIS — I639 Cerebral infarction, unspecified: Secondary | ICD-10-CM | POA: Diagnosis not present

## 2023-02-16 DIAGNOSIS — K219 Gastro-esophageal reflux disease without esophagitis: Secondary | ICD-10-CM

## 2023-02-16 DIAGNOSIS — M109 Gout, unspecified: Secondary | ICD-10-CM

## 2023-02-16 DIAGNOSIS — M1611 Unilateral primary osteoarthritis, right hip: Secondary | ICD-10-CM | POA: Diagnosis not present

## 2023-02-16 DIAGNOSIS — I25810 Atherosclerosis of coronary artery bypass graft(s) without angina pectoris: Secondary | ICD-10-CM | POA: Diagnosis not present

## 2023-02-16 DIAGNOSIS — I1310 Hypertensive heart and chronic kidney disease without heart failure, with stage 1 through stage 4 chronic kidney disease, or unspecified chronic kidney disease: Secondary | ICD-10-CM

## 2023-02-16 DIAGNOSIS — I10 Essential (primary) hypertension: Secondary | ICD-10-CM

## 2023-02-16 MED ORDER — ACETAMINOPHEN 325 MG PO TABS: 650.0000 mg | ORAL_TABLET | Freq: Four times a day (QID) | ORAL | Status: AC | PRN

## 2023-02-16 MED ORDER — AMLODIPINE BESYLATE 10 MG PO TABS: 10.0000 mg | ORAL_TABLET | Freq: Every day | ORAL | Status: AC

## 2023-02-16 MED ORDER — HYDROMORPHONE HCL 1 MG/ML IJ SOLN
0.5000 mg | Freq: Once | INTRAMUSCULAR | Status: AC
  Administered 2023-02-16: 0.5 mg via INTRAVENOUS
  Filled 2023-02-16: qty 0.5

## 2023-02-16 MED ORDER — CLOPIDOGREL BISULFATE 75 MG PO TABS: 75.0000 mg | ORAL_TABLET | Freq: Every day | ORAL | Status: AC

## 2023-02-16 MED ORDER — ROSUVASTATIN CALCIUM 10 MG PO TABS: 10.0000 mg | ORAL_TABLET | Freq: Every day | ORAL | Status: AC

## 2023-02-16 MED ORDER — ASPIRIN 81 MG PO TBEC: 81.0000 mg | DELAYED_RELEASE_TABLET | Freq: Every day | ORAL | Status: AC

## 2023-02-16 MED ORDER — CARVEDILOL 6.25 MG PO TABS: 6.2500 mg | ORAL_TABLET | Freq: Two times a day (BID) | ORAL | Status: AC

## 2023-02-16 MED ORDER — PANTOPRAZOLE SODIUM 40 MG PO TBEC: 40.0000 mg | DELAYED_RELEASE_TABLET | Freq: Every day | ORAL | Status: AC

## 2023-02-16 NOTE — Progress Notes (Signed)
Attempted to call the [facility] for handoff at [phone] but assigned nurse could not be reached out.

## 2023-02-16 NOTE — Progress Notes (Deleted)
Patient with increasing confusion.  Ammonia level 114.  Lactulose increased.  30 mg 3 times daily, first dose now

## 2023-02-16 NOTE — Discharge Summary (Addendum)
Physician Discharge Summary  [name] DGU:[id number] DOB: 1927-03-19 DOA: 02/12/2023  PCP: Nona Dell, NP  Admit date: 02/12/2023 Discharge date: 02/16/2023  Admitted From: (Home) Disposition:  (SNF)  Recommendations for Outpatient Follow-up:  Follow up with PCP in 1-2 weeks Please obtain BMP/CBC in one week Continue with dual antiplatelet therapy for total of 21 days, then Plavix alone Patient will need routine follow-up with orthopedic as an outpatient guarding severe right hip osteoarthritis with acetabular protrusion   CODE STATUS : DNR, confirmed by patient Diet recommendation: Heart Healthy   Brief/Interim Summary: [name] is a 89 y.o. female with medical history significant of hypertension, CAD s/p CABG, CVA, diabetes mellitus type 2, rheumatoid arthritis presents with sudden bilateral loss of sensation in the hands and legs, leading to a fall.patient had been hospitalized in June of this year after presenting with left upper extremity weakness.  MRI of the brain was negative at that time, but neurology felt clinical syndrome was compelling and likely an MRI negative stroke after stopping aspirin.  -MRI brain was significant for acute CVA, she is admitted for further workup.  She was seen by neurology, recommendation for dual antiplatelet therapy aspirin and Plavix, then Plavix alone.   Acute CVA -MRI significant for 11 mm acute infarct in the right  thalamocapsular junction.   -Neurology input greatly appreciated, it was felt secondary to small vessel disease, he was on aspirin prior to admission, recommendation for dual antiplatelet therapy aspirin and Plavix for 3 weeks, then Plavix alone. -2D echo with preserved EF 60 to 65% -A1c acceptable at 6.6 -LDL is 95 back on statin -PT/OT consult greatly appreciated, recommendation for SNF.   Controlled diabetes mellitus type 2, without long-term use of insulin -A1c is acceptable at 6.6, continue with  Jardiance   Essential hypertension Home blood pressure regimen appears to include amlodipine 5 mg daily and Coreg 12.5 mg twice daily. -This has been adjusted, Coreg has been lowered due to sinus bradycardia, and amlodipine has been increased instead.   Sinus bradycardia -Heart rate noted to be in telemetry to be in the 50s, she is asymptomatic, no heart blocks or pauses or pauses on telemetry, did decrease his Coreg from 12.5 mg to 6.25 mg p.o. twice daily  Hyperlipidemia Patient was started on Crestor during last hospitalization, but does not appear to be on her current medication list. -LDL is  95, back on Crestor   Chronic kidney disease stage IV Creatinine 2.16 with BUN 57 which appears around patient's baseline.     Anemia of chronic disease Chronic.  Hemoglobin 11.8 which appears around patient's baseline.    Right hip pain Severe right hip osteoarthritis with blood protrusion Lumbar radiculopathy-sciatica Chronic.  Patient reports having right hip pain for which she has previously been seen by orthopedics for radiculopathy and osteoarthritis of the hip.  Patient reports having hallucinations with tramadol. -Continue Tylenol and warm compresses as needed  -RI of right hip showing severe right hip osteoarthritis with right acetabular protrusion, need routine follow-up with her orthopedic as an outpatient   Rheumatoid arthritis -Patient reports she just finished a course of 10 days prednisone  Discharge Diagnoses:  Principal Problem:   CVA (cerebral vascular accident) (HCC) Active Problems:   DM type 2 (diabetes mellitus, type 2) (HCC)   Essential hypertension   Hyperlipidemia   CKD (chronic kidney disease), stage IV (HCC)   Anemia of chronic disease   Arthritis of right hip   Acute CVA (cerebrovascular accident) (HCC)  Discharge Instructions  Discharge Instructions     Ambulatory referral to Neurology   Complete by: As directed    Follow up with stroke  clinic NP (Jessica Vanschaick or Darrol Angel, if both not available, consider Manson Allan, or Ahern) at [facility] in about 4 weeks. Thanks.   Diet - low sodium heart healthy   Complete by: As directed    Discharge instructions   Complete by: As directed    Follow with Primary MD Schmerge, Belenda Cruise, NP/R SNF physician  Get CBC, CMP, checked  by Primary MD next visit.    Activity: As tolerated with Full fall precautions use walker/cane & assistance as needed   Disposition SNF   Diet: Heart Healthy , with feeding assistance and aspiration precautions.    On your next visit with your primary care physician please Get Medicines reviewed and adjusted.   Please request your Prim.MD to go over all Hospital Tests and Procedure/Radiological results at the follow up, please get all Hospital records sent to your Prim MD by signing hospital release before you go home.   If you experience worsening of your admission symptoms, develop shortness of breath, life threatening emergency, suicidal or homicidal thoughts you must seek medical attention immediately by calling 911 or calling your MD immediately  if symptoms less severe.  You Must read complete instructions/literature along with all the possible adverse reactions/side effects for all the Medicines you take and that have been prescribed to you. Take any new Medicines after you have completely understood and accpet all the possible adverse reactions/side effects.   Do not drive, operating heavy machinery, perform activities at heights, swimming or participation in water activities or provide baby sitting services if your were admitted for syncope or siezures until you have seen by Primary MD or a Neurologist and advised to do so again.  Do not drive when taking Pain medications.    Do not take more than prescribed Pain, Sleep and Anxiety Medications  Special Instructions: If you have smoked or chewed Tobacco  in the last 2 yrs please  stop smoking, stop any regular Alcohol  and or any Recreational drug use.  Wear Seat belts while driving.   Please note  You were cared for by a hospitalist during your hospital stay. If you have any questions about your discharge medications or the care you received while you were in the hospital after you are discharged, you can call the unit and asked to speak with the hospitalist on call if the hospitalist that took care of you is not available. Once you are discharged, your primary care physician will handle any further medical issues. Please note that NO REFILLS for any discharge medications will be authorized once you are discharged, as it is imperative that you return to your primary care physician (or establish a relationship with a primary care physician if you do not have one) for your aftercare needs so that they can reassess your need for medications and monitor your lab values.   Increase activity slowly   Complete by: As directed       Allergies as of 02/16/2023   No Known Allergies      Medication List     STOP taking these medications    acetaminophen 650 MG CR tablet Commonly known as: TYLENOL Replaced by: acetaminophen 325 MG tablet   traMADol 50 MG tablet Commonly known as: ULTRAM       TAKE these medications    Accu-Chek Aviva Plus  w/Device Kit Use to check  Blood sugars up to 3 times a day.  Dx code e11.65   Accu-Chek Aviva Soln Use with accu-check aviva machine   Accu-Chek Softclix Lancets lancets Use as instructed to check blood sugars up to 3 times a day  Dx code e11.65   acetaminophen 325 MG tablet Commonly known as: TYLENOL Take 2 tablets (650 mg total) by mouth every 6 (six) hours as needed for mild pain (pain score 1-3), moderate pain (pain score 4-6), fever or headache (or temp > 37.5 C (99.5 F)). Replaces: acetaminophen 650 MG CR tablet   amLODipine 10 MG tablet Commonly known as: NORVASC Take 1 tablet (10 mg total) by mouth  daily. What changed:  medication strength See the new instructions.   aspirin EC 81 MG tablet Take 1 tablet (81 mg total) by mouth daily for 19 days. Swallow whole. Please stop on 1/18 What changed: additional instructions   carvedilol 6.25 MG tablet Commonly known as: Coreg Take 1 tablet (6.25 mg total) by mouth 2 (two) times daily. What changed:  medication strength See the new instructions.   clopidogrel 75 MG tablet Commonly known as: PLAVIX Take 1 tablet (75 mg total) by mouth daily. Start taking on: February 17, 2023   fluticasone 50 MCG/ACT nasal spray Commonly known as: FLONASE Place 2 sprays into both nostrils daily as needed for rhinitis.   glucose blood test strip Use as instructed to check blood sugars up to 3 times a day.  Dx code e11.65   Jardiance 10 MG Tabs tablet Generic drug: empagliflozin Take 10 mg by mouth daily.   multivitamin capsule Take 1 capsule by mouth daily.   nitroGLYCERIN 0.4 MG SL tablet Commonly known as: NITROSTAT PLACE 1 TABLET UNDER THE TONGUE AT THE FIRST SIGN OF ATTACK, MAY REPEAT EVERY 5 MINUTES FOR 3 DOSES IN 15 MINUTES. IF PAIN PERSISTS CALL 911 What changed:  how much to take how to take this when to take this reasons to take this   pantoprazole 40 MG tablet Commonly known as: PROTONIX Take 1 tablet (40 mg total) by mouth daily. Start taking on: February 17, 2023   polyethylene glycol 17 g packet Commonly known as: MIRALAX / GLYCOLAX Take 17 g by mouth daily as needed for mild constipation.   rosuvastatin 10 MG tablet Commonly known as: CRESTOR Take 1 tablet (10 mg total) by mouth daily. Start taking on: February 17, 2023        Contact information for follow-up providers     [facility]. Schedule an appointment as soon as possible for a visit in 1 month(s).   Specialty: Neurology Why: stroke clinic Contact information: [address] [place]  [address] [phone]             Contact information for after-discharge care     Destination     [name] .   Service: Skilled Nursing Contact information: [address] Test Update Address [place] [address] [phone]                    No Known Allergies  Consultations: Neurology    Procedures/Studies: MR HIP RIGHT WO CONTRAST Result Date: 02/16/2023 CLINICAL DATA:  Right hip pain. EXAM: MR OF THE RIGHT HIP WITHOUT CONTRAST TECHNIQUE: Multiplanar, multisequence MR imaging was performed. No intravenous contrast was administered. COMPARISON:  Pelvis x-rays from same day. Right hip x-rays dated April 05, 2021. FINDINGS:  Bones: There is no evidence of acute fracture, dislocation or avascular necrosis. Focal marrow edema in the right femoral neck is likely stress related. No focal bone lesion. The visualized sacroiliac joints and symphysis pubis appear normal. Degenerative changes of the visualized lumbar spine. Articular cartilage and labrum Articular cartilage: Extensive full-thickness cartilage loss in the right hip joint with subchondral marrow edema/cystic change, marginal osteophytes, and acetabular protrusio. Chronic synovial hypertrophy in the right hip joint with remodeling of the superior right femoral neck scattered partial and full-thickness cartilage loss in the left hip joint without subchondral signal abnormality. Labrum: The right labrum is diffusely degenerated and torn. No paralabral abnormality. Joint or bursal effusion Joint effusion: No significant hip joint effusion. Bursae: No focal periarticular fluid collection. Muscles and tendons Muscles and tendons: The visualized gluteus, hamstring and iliopsoas tendons are intact. Prominent bilateral gluteus minimus and medius muscle atrophy. Asymmetric right piriformis muscle atrophy. Other findings Miscellaneous: Small uterine fibroids. IMPRESSION: 1. Severe right hip  osteoarthritis with acetabular protrusio. 2. Stress reaction of the right femoral neck.  No fracture. Electronically Signed   By: Obie Dredge M.D.   On: 02/16/2023 11:12   DG Pelvis Portable Result Date: 02/16/2023 CLINICAL DATA:  Falls, initial encounter.  Right leg weakness. EXAM: PORTABLE PELVIS 1-2 VIEWS; RIGHT FEMUR 2 VIEWS COMPARISON:  None Available. FINDINGS: Pelvis: Right acetabular protrusio. No advanced right hip osteoarthritis with near complete joint space loss, subchondral cysts and sclerosis. There may be cystic changes in the femoral neck. No evidence of acute fracture. The bones are diffusely under mineralized. No pubic symphyseal or sacroiliac diastasis. Prominent vascular calcifications. Right femur: The distal femur is intact. No distal femur fracture. There is moderate knee osteoarthritis. Soft tissues are atrophic. There are vascular calcifications. IMPRESSION: 1. Advanced right hip osteoarthritis with acetabular protrusion. No radiographic evidence of acute fracture. In the setting of advanced chronic change, consider further assessment with CT or MRI if there is persistent clinical concern for fracture. 2. Possible cystic changes in the femoral neck. 3. Moderate right knee osteoarthritis. Electronically Signed   By: Narda Rutherford M.D.   On: 02/16/2023 03:26   DG FEMUR, MIN 2 VIEWS RIGHT Result Date: 02/16/2023 CLINICAL DATA:  Falls, initial encounter.  Right leg weakness. EXAM: PORTABLE PELVIS 1-2 VIEWS; RIGHT FEMUR 2 VIEWS COMPARISON:  None Available. FINDINGS: Pelvis: Right acetabular protrusio. No advanced right hip osteoarthritis with near complete joint space loss, subchondral cysts and sclerosis. There may be cystic changes in the femoral neck. No evidence of acute fracture. The bones are diffusely under mineralized. No pubic symphyseal or sacroiliac diastasis. Prominent vascular calcifications. Right femur: The distal femur is intact. No distal femur fracture. There is  moderate knee osteoarthritis. Soft tissues are atrophic. There are vascular calcifications. IMPRESSION: 1. Advanced right hip osteoarthritis with acetabular protrusion. No radiographic evidence of acute fracture. In the setting of advanced chronic change, consider further assessment with CT or MRI if there is persistent clinical concern for fracture. 2. Possible cystic changes in the femoral neck. 3. Moderate right knee osteoarthritis. Electronically Signed   By: Narda Rutherford M.D.   On: 02/16/2023 03:26   VAS US CAROTID Result Date: 02/13/2023 Carotid Arterial Duplex Study Patient Name:  Aura Leisinger  Date of Exam:   02/13/2023 Medical Rec #: [id number]        Accession #:    [id number] Date of Birth: Dec 02, 1927        Patient Gender: F Patient Age:   43 years Exam  Location:  [facility] Procedure:      VAS US CAROTID Referring Phys: Scheryl Marten XU --------------------------------------------------------------------------------  Indications:       CVA and S/P CABG 2003. Risk Factors:      Hypertension, Diabetes, coronary artery disease, prior CVA. Comparison Study:  Previous study on 6.20.2024. Performing Technologist: Fernande Bras  Examination Guidelines: A complete evaluation includes B-mode imaging, spectral Doppler, color Doppler, and power Doppler as needed of all accessible portions of each vessel. Bilateral testing is considered an integral part of a complete examination. Limited examinations for reoccurring indications may be performed as noted.  Right Carotid Findings: +----------+--------+-------+--------+--------------------------------+--------+           PSV cm/sEDV    StenosisPlaque Description              Comments                   cm/s                                                    +----------+--------+-------+--------+--------------------------------+--------+ CCA Prox  84      11                                                       +----------+--------+-------+--------+--------------------------------+--------+ CCA Distal73      12                                                      +----------+--------+-------+--------+--------------------------------+--------+ ICA Prox  53      12             smooth, calcific and                                                      heterogenous                             +----------+--------+-------+--------+--------------------------------+--------+ ICA Mid   82      18             heterogenous                             +----------+--------+-------+--------+--------------------------------+--------+ ICA Distal72      15                                                      +----------+--------+-------+--------+--------------------------------+--------+ ECA       139     17                                                      +----------+--------+-------+--------+--------------------------------+--------+ +----------+--------+-------+--------+-------------------+  PSV cm/sEDV cmsDescribeArm Pressure (mmHG) +----------+--------+-------+--------+-------------------+ Subclavian80                                         +----------+--------+-------+--------+-------------------+ +---------+--------+--+--------+--+ VertebralPSV cm/s57EDV cm/s12 +---------+--------+--+--------+--+  Left Carotid Findings: +----------+--------+--------+--------+-------------------------+--------+           PSV cm/sEDV cm/sStenosisPlaque Description       Comments +----------+--------+--------+--------+-------------------------+--------+ CCA Prox  120     13                                                +----------+--------+--------+--------+-------------------------+--------+ CCA Distal81      12                                                +----------+--------+--------+--------+-------------------------+--------+ ICA Prox  86      15               calcific and heterogenous         +----------+--------+--------+--------+-------------------------+--------+ ICA Mid   83      15                                                +----------+--------+--------+--------+-------------------------+--------+ ICA Distal82      22                                                +----------+--------+--------+--------+-------------------------+--------+ ECA       84      14                                                +----------+--------+--------+--------+-------------------------+--------+ +----------+--------+--------+--------+-------------------+           PSV cm/sEDV cm/sDescribeArm Pressure (mmHG) +----------+--------+--------+--------+-------------------+ [id number]                                         +----------+--------+--------+--------+-------------------+ +---------+--------+--+--------+--+ VertebralPSV cm/s52EDV cm/s10 +---------+--------+--+--------+--+   Summary: Right Carotid: Velocities in the right ICA are consistent with a 1-39% stenosis. Left Carotid: Velocities in the left ICA are consistent with a 1-39% stenosis. Vertebrals:  Bilateral vertebral arteries demonstrate antegrade flow. Subclavians: Normal flow hemodynamics were seen in bilateral subclavian              arteries. *See table(s) above for measurements and observations.  Electronically signed by Heath Lark on 02/13/2023 at 9:23:12 PM.    Final    MR ANGIO HEAD WO CONTRAST Result Date: 02/13/2023 CLINICAL DATA:  Provided history: Stroke, follow-up. EXAM: MRA HEAD WITHOUT CONTRAST TECHNIQUE: Angiographic images of the Circle of Willis were acquired using MRA technique without intravenous contrast. COMPARISON:  Brain MRI 02/12/2023. Report from MRA head  10/08/2021 (images unavailable). FINDINGS: Anterior circulation: The intracranial internal carotid arteries are patent. The M1 middle cerebral arteries are patent. No M2 proximal  branch occlusion defied. Severe stenosis within a proximal M2 left middle cerebral artery vessel (series 254, image 3). The anterior cerebral arteries are patent. 2 mm inferiorly projecting vascular protrusion arising from the cavernous left internal carotid artery, likely reflecting an aneurysm (series 254, image 15). Posterior circulation: The intracranial vertebral arteries are patent. The basilar artery is patent. The posterior cerebral arteries are patent. Posterior communicating arteries are diminutive or absent, bilaterally. Anatomic variants: As described. IMPRESSION: 1. No proximal intracranial large vessel occlusion identified. 2. Severe stenosis within a proximal M2 left middle cerebral artery vessel. 3. 2 mm inferiorly projecting vascular protrusion arising from the cavernous left internal carotid artery, likely reflecting an aneurysm. Electronically Signed   By: Jackey Loge D.O.   On: 02/13/2023 14:50   ECHOCARDIOGRAM COMPLETE Result Date: 02/13/2023    ECHOCARDIOGRAM REPORT   Patient Name:   Makaylie Evon Date of Exam: 02/13/2023 Medical Rec #:  [id number]       Height:       64.0 in Accession #:    [id number]      Weight:       123.5 lb Date of Birth:  09-14-1927       BSA:          1.594 m Patient Age:    89 years        BP:           152/46 mmHg Patient Gender: F               HR:           62 bpm. Exam Location:  Inpatient Procedure: 2D Echo, Cardiac Doppler and Color Doppler Indications:    Stroke  History:        Patient has prior history of Echocardiogram examinations, most                 recent 08/07/2022. CAD, Prior CABG, PAD and Stroke; Risk                 Factors:Hypertension, Dyslipidemia and Diabetes. Chronic kidney                 disease.  Sonographer:    Vern Claude Referring Phys: [id number] RONDELL A SMITH IMPRESSIONS  1. Left ventricular ejection fraction, by estimation, is 60 to 65%. The left ventricle has normal function. The left ventricle has no regional wall motion  abnormalities. Left ventricular diastolic parameters are consistent with Grade I diastolic dysfunction (impaired relaxation). Elevated left atrial pressure.  2. Right ventricular systolic function is normal. The right ventricular size is normal. There is normal pulmonary artery systolic pressure.  3. The mitral valve is normal in structure. Mild mitral valve regurgitation. No evidence of mitral stenosis. Moderate mitral annular calcification.  4. The aortic valve is tricuspid. Aortic valve regurgitation is not visualized. Aortic valve sclerosis/calcification is present, without any evidence of aortic stenosis.  5. The inferior vena cava is normal in size with greater than 50% respiratory variability, suggesting right atrial pressure of 3 mmHg. FINDINGS  Left Ventricle: Left ventricular ejection fraction, by estimation, is 60 to 65%. The left ventricle has normal function. The left ventricle has no regional wall motion abnormalities. The left ventricular internal cavity size was normal in size. There is  no left ventricular hypertrophy. Left ventricular diastolic parameters are consistent with Grade  I diastolic dysfunction (impaired relaxation). Elevated left atrial pressure. Right Ventricle: The right ventricular size is normal. Right ventricular systolic function is normal. There is normal pulmonary artery systolic pressure. The tricuspid regurgitant velocity is 2.50 m/s, and with an assumed right atrial pressure of 3 mmHg,  the estimated right ventricular systolic pressure is 28.0 mmHg. Left Atrium: Left atrial size was normal in size. Right Atrium: Right atrial size was normal in size. Pericardium: There is no evidence of pericardial effusion. Mitral Valve: The mitral valve is normal in structure. Moderate mitral annular calcification. Mild mitral valve regurgitation. No evidence of mitral valve stenosis. MV peak gradient, 9.0 mmHg. The mean mitral valve gradient is 3.0 mmHg. Tricuspid Valve: The tricuspid valve  is normal in structure. Tricuspid valve regurgitation is mild . No evidence of tricuspid stenosis. Aortic Valve: The aortic valve is tricuspid. Aortic valve regurgitation is not visualized. Aortic valve sclerosis/calcification is present, without any evidence of aortic stenosis. Aortic valve mean gradient measures 3.0 mmHg. Aortic valve peak gradient measures 6.1 mmHg. Aortic valve area, by VTI measures 2.48 cm. Pulmonic Valve: The pulmonic valve was normal in structure. Pulmonic valve regurgitation is not visualized. No evidence of pulmonic stenosis. Aorta: The aortic root is normal in size and structure. Venous: The inferior vena cava is normal in size with greater than 50% respiratory variability, suggesting right atrial pressure of 3 mmHg. IAS/Shunts: No atrial level shunt detected by color flow Doppler.  LEFT VENTRICLE PLAX 2D LVIDd:         3.80 cm      Diastology LVIDs:         2.80 cm      LV e' medial:    2.93 cm/s LV PW:         0.70 cm      LV E/e' medial:  26.3 LV IVS:        1.10 cm      LV e' lateral:   4.20 cm/s LVOT diam:     1.80 cm      LV E/e' lateral: 18.4 LV SV:         63 LV SV Index:   40 LVOT Area:     2.54 cm  LV Volumes (MOD) LV vol d, MOD A2C: 79.7 ml LV vol d, MOD A4C: 111.0 ml LV vol s, MOD A2C: 24.8 ml LV vol s, MOD A4C: 28.0 ml LV SV MOD A2C:     54.9 ml LV SV MOD A4C:     111.0 ml LV SV MOD BP:      70.8 ml RIGHT VENTRICLE            IVC RV Basal diam:  2.80 cm    IVC diam: 1.20 cm RV Mid diam:    1.80 cm RV S prime:     8.93 cm/s TAPSE (M-mode): 1.9 cm LEFT ATRIUM             Index        RIGHT ATRIUM          Index LA diam:        2.90 cm 1.82 cm/m   RA Area:     6.26 cm LA Vol (A2C):   44.1 ml 27.67 ml/m  RA Volume:   9.75 ml  6.12 ml/m LA Vol (A4C):   23.5 ml 14.75 ml/m LA Biplane Vol: 32.4 ml 20.33 ml/m  AORTIC VALVE  PULMONIC VALVE AV Area (Vmax):    2.13 cm     PV Vmax:       1.10 m/s AV Area (Vmean):   2.25 cm     PV Peak grad:  4.8 mmHg AV Area  (VTI):     2.48 cm AV Vmax:           123.00 cm/s AV Vmean:          83.700 cm/s AV VTI:            0.255 m AV Peak Grad:      6.1 mmHg AV Mean Grad:      3.0 mmHg LVOT Vmax:         103.00 cm/s LVOT Vmean:        74.000 cm/s LVOT VTI:          0.249 m LVOT/AV VTI ratio: 0.98  AORTA Ao Root diam: 2.30 cm Ao Asc diam:  2.50 cm MITRAL VALVE                TRICUSPID VALVE MV Area (PHT): 1.83 cm     TR Peak grad:   25.0 mmHg MV Area VTI:   1.64 cm     TR Vmax:        250.00 cm/s MV Peak grad:  9.0 mmHg MV Mean grad:  3.0 mmHg     SHUNTS MV Vmax:       1.50 m/s     Systemic VTI:  0.25 m MV Vmean:      82.2 cm/s    Systemic Diam: 1.80 cm MV Decel Time: 415 msec MV E velocity: 77.10 cm/s MV A velocity: 149.00 cm/s MV E/A ratio:  0.52 Olga Millers MD Electronically signed by Olga Millers MD Signature Date/Time: 02/13/2023/1:43:40 PM    Final    MR BRAIN WO CONTRAST Result Date: 02/12/2023 CLINICAL DATA:  Provided history: Neuro deficit, acute, stroke suspected. Additional history provided: Recent fall. Increased weakness/numbness. Paresthesias and weakness on left side. EXAM: MRI HEAD WITHOUT CONTRAST MRI CERVICAL SPINE WITHOUT CONTRAST TECHNIQUE: Multiplanar, multiecho pulse sequences of the brain and surrounding structures, and cervical spine, to include the craniocervical junction and cervicothoracic junction, were obtained without intravenous contrast. COMPARISON:  Head CT 02/12/2023. Brain MRI 08/06/2022. Cervical spine MRI 08/06/2022. FINDINGS: MRI HEAD FINDINGS Brain: Generalized cerebral atrophy. 11 mm acute infarct at the right thalamocapsular junction. Chronic lacunar infarcts again noted within the bilateral cerebral hemispheric white matter and within/about the bilateral deep gray nuclei. Background advanced multifocal T2 FLAIR hyperintense signal abnormality within the cerebral white matter, nonspecific but compatible with chronic small vessel ischemic disease. Minimal chronic small vessel ischemic  changes also noted within the pons. No evidence of an intracranial mass. No extra-axial fluid collection. No midline shift. Vascular: Maintained flow voids within the proximal large arterial vessels. Skull and upper cervical spine: No focal worrisome marrow lesion. Sinuses/Orbits: No mass or acute finding within the imaged orbits. Prior bilateral ocular lens replacement. No significant paranasal sinus disease. MRI CERVICAL SPINE FINDINGS The axial T2 sequences are moderately motion degraded. Within this limitation, findings are as follows. Alignment: Grade 1 anterolisthesis at C3-C4, C4-C5, C7-T1, T1-T2 and T2-T3. Vertebrae: Cervical vertebral body height is maintained. Vertebral body and left-sided facet ankylosis at C5-C6. Moderate to prominent marrow edema at C6-C7, progressed from the prior cervical spine MRI of 08/06/2022 and likely degenerative. Degenerative marrow edema also again noted at the C1-C2 level. Cord: Within limitations of motion degradation, no signal abnormality  is identified within the cervical spinal cord. Posterior Fossa, vertebral arteries, paraspinal tissues: Posterior fossa assessed on concurrently performed brain MRI. Flow voids preserved within the imaged cervical vertebral arteries. No paraspinal mass or collection. Disc levels: Unless otherwise stated, the level by level findings below have not significantly changed from the prior MRI of 08/06/2022. Multilevel disc degeneration. At the non-fused levels, disc degeneration is greatest at C3-C4, C4-C5 and C7-T1 (advanced at these levels). C1-C2: Ligamentous hypertrophy posterior to the dens. Ligamentum flavum thickening. Mild-to-moderate spinal canal stenosis with mild spinal cord flattening. C2-C3: Posterior disc osteophyte complex with right greater than left disc osteophyte ridge/uncinate hypertrophy. Facet arthropathy (greater on the left). Ligamentum flavum thickening. Mild spinal canal narrowing. Moderate right neural foraminal  narrowing. C3-C4: Grade 1 anterolisthesis. Disc uncovering. Posterior disc osteophyte complex with right-sided disc osteophyte ridge/uncinate hypertrophy. Facet arthropathy (greater on the left). Ligamentum flavum thickening. Mild-to-moderate spinal canal stenosis. Bilateral neural foraminal narrowing (severe right, moderate left). C4-C5: Grade 1 anterolisthesis. Disc uncovering. Posterior disc osteophyte complex with bilateral disc osteophyte ridge/uncinate hypertrophy. Facet arthropathy. Ligamentum flavum thickening. Mild-to-moderate spinal canal stenosis. Bilateral neural foraminal narrowing (severe right, moderate left). C5-C6: Vertebral ankylosis. Vertebral osteophytosis. Bilateral uncovertebral hypertrophy. No significant spinal canal stenosis. Bilateral neural foraminal narrowing (moderate right, mild left). C6-C7: Posterior disc osteophyte complex with bilateral disc osteophyte ridge/uncinate hypertrophy. Facet arthropathy (greater on the right). Ligamentum flavum thickening. Mild-to-moderate spinal canal stenosis. Severe bilateral neural foraminal narrowing. C7-T1: Grade 1 anterolisthesis. Disc uncovering with disc bulge. Facet arthropathy (greater on the left). Mild spinal canal stenosis. Severe bilateral neural foraminal narrowing. T1-T2: Grade 1 anterolisthesis. Disc uncovering with disc bulge. Facet arthropathy. Ligamentum flavum thickening. Mild spinal canal narrowing. Bilateral neural foraminal narrowing (moderate right, mild left). IMPRESSION: MRI brain: 1. 11 mm acute infarct at the right thalamocapsular junction. 2. Background parenchymal atrophy, advanced chronic small vessel ischemic disease and chronic lacunar infarcts as described. MRI cervical spine: 1. Motion degraded exam. 2. Spondylosis at the cervical and visualized upper thoracic levels as outlined within the body of the report. Moderate-to-prominent marrow edema within the C6 and C7 vertebral bodies, likely degenerative and progressed  from the prior MRI of 08/06/2022. Cervical spondylosis has otherwise not significantly changed from this prior study. 3. Spinal canal stenosis is greatest at C1-C2, C3-C4, C4-C5 and C6-C7 (mild-to-moderate at these levels). 4. Multilevel foraminal stenosis. Most notably, severe foraminal stenosis is present on the right at C3-C4, on the right at C4-C5, bilaterally at C6-C7 and bilaterally at C7-T1. 5. Disc degeneration is advanced at C3-C4, C4-C5 and C7-T1. 6. C5-C6 vertebral ankylosis. Electronically Signed   By: Jackey Loge D.O.   On: 02/12/2023 15:30   MR Cervical Spine Wo Contrast Result Date: 02/12/2023 CLINICAL DATA:  Provided history: Neuro deficit, acute, stroke suspected. Additional history provided: Recent fall. Increased weakness/numbness. Paresthesias and weakness on left side. EXAM: MRI HEAD WITHOUT CONTRAST MRI CERVICAL SPINE WITHOUT CONTRAST TECHNIQUE: Multiplanar, multiecho pulse sequences of the brain and surrounding structures, and cervical spine, to include the craniocervical junction and cervicothoracic junction, were obtained without intravenous contrast. COMPARISON:  Head CT 02/12/2023. Brain MRI 08/06/2022. Cervical spine MRI 08/06/2022. FINDINGS: MRI HEAD FINDINGS Brain: Generalized cerebral atrophy. 11 mm acute infarct at the right thalamocapsular junction. Chronic lacunar infarcts again noted within the bilateral cerebral hemispheric white matter and within/about the bilateral deep gray nuclei. Background advanced multifocal T2 FLAIR hyperintense signal abnormality within the cerebral white matter, nonspecific but compatible with chronic small vessel ischemic disease. Minimal chronic small vessel  ischemic changes also noted within the pons. No evidence of an intracranial mass. No extra-axial fluid collection. No midline shift. Vascular: Maintained flow voids within the proximal large arterial vessels. Skull and upper cervical spine: No focal worrisome marrow lesion. Sinuses/Orbits: No  mass or acute finding within the imaged orbits. Prior bilateral ocular lens replacement. No significant paranasal sinus disease. MRI CERVICAL SPINE FINDINGS The axial T2 sequences are moderately motion degraded. Within this limitation, findings are as follows. Alignment: Grade 1 anterolisthesis at C3-C4, C4-C5, C7-T1, T1-T2 and T2-T3. Vertebrae: Cervical vertebral body height is maintained. Vertebral body and left-sided facet ankylosis at C5-C6. Moderate to prominent marrow edema at C6-C7, progressed from the prior cervical spine MRI of 08/06/2022 and likely degenerative. Degenerative marrow edema also again noted at the C1-C2 level. Cord: Within limitations of motion degradation, no signal abnormality is identified within the cervical spinal cord. Posterior Fossa, vertebral arteries, paraspinal tissues: Posterior fossa assessed on concurrently performed brain MRI. Flow voids preserved within the imaged cervical vertebral arteries. No paraspinal mass or collection. Disc levels: Unless otherwise stated, the level by level findings below have not significantly changed from the prior MRI of 08/06/2022. Multilevel disc degeneration. At the non-fused levels, disc degeneration is greatest at C3-C4, C4-C5 and C7-T1 (advanced at these levels). C1-C2: Ligamentous hypertrophy posterior to the dens. Ligamentum flavum thickening. Mild-to-moderate spinal canal stenosis with mild spinal cord flattening. C2-C3: Posterior disc osteophyte complex with right greater than left disc osteophyte ridge/uncinate hypertrophy. Facet arthropathy (greater on the left). Ligamentum flavum thickening. Mild spinal canal narrowing. Moderate right neural foraminal narrowing. C3-C4: Grade 1 anterolisthesis. Disc uncovering. Posterior disc osteophyte complex with right-sided disc osteophyte ridge/uncinate hypertrophy. Facet arthropathy (greater on the left). Ligamentum flavum thickening. Mild-to-moderate spinal canal stenosis. Bilateral neural  foraminal narrowing (severe right, moderate left). C4-C5: Grade 1 anterolisthesis. Disc uncovering. Posterior disc osteophyte complex with bilateral disc osteophyte ridge/uncinate hypertrophy. Facet arthropathy. Ligamentum flavum thickening. Mild-to-moderate spinal canal stenosis. Bilateral neural foraminal narrowing (severe right, moderate left). C5-C6: Vertebral ankylosis. Vertebral osteophytosis. Bilateral uncovertebral hypertrophy. No significant spinal canal stenosis. Bilateral neural foraminal narrowing (moderate right, mild left). C6-C7: Posterior disc osteophyte complex with bilateral disc osteophyte ridge/uncinate hypertrophy. Facet arthropathy (greater on the right). Ligamentum flavum thickening. Mild-to-moderate spinal canal stenosis. Severe bilateral neural foraminal narrowing. C7-T1: Grade 1 anterolisthesis. Disc uncovering with disc bulge. Facet arthropathy (greater on the left). Mild spinal canal stenosis. Severe bilateral neural foraminal narrowing. T1-T2: Grade 1 anterolisthesis. Disc uncovering with disc bulge. Facet arthropathy. Ligamentum flavum thickening. Mild spinal canal narrowing. Bilateral neural foraminal narrowing (moderate right, mild left). IMPRESSION: MRI brain: 1. 11 mm acute infarct at the right thalamocapsular junction. 2. Background parenchymal atrophy, advanced chronic small vessel ischemic disease and chronic lacunar infarcts as described. MRI cervical spine: 1. Motion degraded exam. 2. Spondylosis at the cervical and visualized upper thoracic levels as outlined within the body of the report. Moderate-to-prominent marrow edema within the C6 and C7 vertebral bodies, likely degenerative and progressed from the prior MRI of 08/06/2022. Cervical spondylosis has otherwise not significantly changed from this prior study. 3. Spinal canal stenosis is greatest at C1-C2, C3-C4, C4-C5 and C6-C7 (mild-to-moderate at these levels). 4. Multilevel foraminal stenosis. Most notably, severe  foraminal stenosis is present on the right at C3-C4, on the right at C4-C5, bilaterally at C6-C7 and bilaterally at C7-T1. 5. Disc degeneration is advanced at C3-C4, C4-C5 and C7-T1. 6. C5-C6 vertebral ankylosis. Electronically Signed   By: Jackey Loge D.O.   On: 02/12/2023 15:30  CT HEAD WO CONTRAST Result Date: 02/12/2023 CLINICAL DATA:  Neuro deficit, acute, stroke suspected. Fall. No visible injuries. EXAM: CT HEAD WITHOUT CONTRAST TECHNIQUE: Contiguous axial images were obtained from the base of the skull through the vertex without intravenous contrast. RADIATION DOSE REDUCTION: This exam was performed according to the departmental dose-optimization program which includes automated exposure control, adjustment of the mA and/or kV according to patient size and/or use of iterative reconstruction technique. COMPARISON:  CT head without contrast 08/06/2022. MR head without contrast 08/06/2022. FINDINGS: Brain: Advanced atrophy and white matter disease is similar the prior exam. Remote lacunar infarcts of the basal ganglia and corona radiata bilaterally are stable. No acute infarct, hemorrhage, or mass lesion is present. The ventricles are of normal size. No significant extraaxial fluid collection is present. The brainstem and cerebellum are within normal limits. Midline structures are within normal limits. Vascular: Atherosclerotic calcifications are present within the cavernous internal carotid arteries and at the dural margin of both vertebral arteries. Skull: Calvarium is intact. No focal lytic or blastic lesions are present. No significant extracranial soft tissue lesion is present. Sinuses/Orbits: The paranasal sinuses and mastoid air cells are clear. The globes and orbits are within normal limits. IMPRESSION: 1. No acute intracranial abnormality or significant interval change. 2. Advanced atrophy and white matter disease likely reflects the sequela of chronic microvascular ischemia. 3. Remote lacunar  infarcts of the basal ganglia and corona radiata bilaterally are stable. Electronically Signed   By: Marin Roberts M.D.   On: 02/12/2023 13:15   DG Chest Portable 1 View Result Date: 02/12/2023 CLINICAL DATA:  Left-sided weakness and numbness.  Fell yesterday. EXAM: PORTABLE CHEST 1 VIEW COMPARISON:  Chest x-ray dated June 11, 2014. FINDINGS: The patient is rotated to the left. Stable cardiomediastinal silhouette with normal heart size status post CABG. Normal pulmonary vascularity. No focal consolidation, pleural effusion, or pneumothorax. No acute osseous abnormality. IMPRESSION: No active disease. Electronically Signed   By: Obie Dredge M.D.   On: 02/12/2023 12:24      Subjective:  No significant events overnight, she has good appetite, no focal deficits, no significant change in her chronic right hip pain Discharge Exam: Vitals:   02/16/23 0755 02/16/23 0829  BP: (!) 144/52 (!) 138/50  Pulse: (!) 54   Resp: 13   Temp: (!) 97.5 F (36.4 C)   SpO2: 100%    Vitals:   02/16/23 0000 02/16/23 0400 02/16/23 0755 02/16/23 0829  BP: (!) 121/54 (!) 120/41 (!) 144/52 (!) 138/50  Pulse: 60 (!) 53 (!) 54   Resp: 17 18 13    Temp:  98 F (36.7 C) (!) 97.5 F (36.4 C)   TempSrc:  Oral Oral   SpO2: 99% 97% 100%   Weight:      Height:        General: Pt is alert, awake, not in acute distress Cardiovascular: RRR, S1/S2 +, no rubs, no gallops Respiratory: CTA bilaterally, no wheezing, no rhonchi Abdominal: Soft, NT, ND, bowel sounds + Extremities: no edema, no cyanosis    The results of significant diagnostics from this hospitalization (including imaging, microbiology, ancillary and laboratory) are listed below for reference.     Microbiology: No results found for this or any previous visit (from the past 240 hours).   Labs: BNP (last 3 results) No results for input(s): "BNP" in the last 8760 hours. Basic Metabolic Panel: Recent Labs  Lab 02/12/23 1124  02/12/23 1159 02/14/23 1202  NA 139 141 136  K  4.4 4.1 4.2  CL 104 106 104  CO2 23  --  22  GLUCOSE 196* 159* 201*  BUN 57* 55* 58*  CREATININE 2.16* 2.20* 2.23*  CALCIUM 9.0  --  8.8*  MG 2.2  --  2.1  PHOS  --   --  3.4   Liver Function Tests: Recent Labs  Lab 02/12/23 1124  AST 28  ALT 15  ALKPHOS 77  BILITOT 0.5  PROT 6.6  ALBUMIN 3.3*   No results for input(s): "LIPASE", "AMYLASE" in the last 168 hours. No results for input(s): "AMMONIA" in the last 168 hours. CBC: Recent Labs  Lab 02/12/23 1124 02/12/23 1159 02/14/23 1202  WBC 7.9  --  6.1  NEUTROABS 6.0  --   --   HGB 11.8* 12.2 11.2*  HCT 37.1 36.0 34.1*  MCV 97.9  --  95.3  PLT 219  --  200   Cardiac Enzymes: No results for input(s): "CKTOTAL", "CKMB", "CKMBINDEX", "TROPONINI" in the last 168 hours. BNP: Invalid input(s): "POCBNP" CBG: Recent Labs  Lab 02/12/23 1124 02/13/23 0821  GLUCAP 176* 126*   D-Dimer No results for input(s): "DDIMER" in the last 72 hours. Hgb A1c No results for input(s): "HGBA1C" in the last 72 hours. Lipid Profile No results for input(s): "CHOL", "HDL", "LDLCALC", "TRIG", "CHOLHDL", "LDLDIRECT" in the last 72 hours. Thyroid function studies No results for input(s): "TSH", "T4TOTAL", "T3FREE", "THYROIDAB" in the last 72 hours.  Invalid input(s): "FREET3" Anemia work up Recent Labs    02/14/23 1202  VITAMINB12 344   Urinalysis    Component Value Date/Time   COLORURINE STRAW (A) 02/12/2023 1810   APPEARANCEUR CLEAR 02/12/2023 1810   LABSPEC 1.006 02/12/2023 1810   PHURINE 8.0 02/12/2023 1810   GLUCOSEU 50 (A) 02/12/2023 1810   HGBUR SMALL (A) 02/12/2023 1810   BILIRUBINUR NEGATIVE 02/12/2023 1810   BILIRUBINUR negative 11/14/2020 1608   KETONESUR NEGATIVE 02/12/2023 1810   PROTEINUR NEGATIVE 02/12/2023 1810   UROBILINOGEN 0.2 11/14/2020 1608   UROBILINOGEN 0.2 04/26/2009 0014   NITRITE NEGATIVE 02/12/2023 1810   LEUKOCYTESUR SMALL (A) 02/12/2023 1810    Sepsis Labs Recent Labs  Lab 02/12/23 1124 02/14/23 1202  WBC 7.9 6.1   Microbiology No results found for this or any previous visit (from the past 240 hours).   Time coordinating discharge: Over 30 minutes  SIGNED:   Huey Bienenstock, MD  [facility] 02/16/2023, 11:29 AM Pager   If 7PM-7AM, please contact night-coverage [email or web address] Password [email or web address]

## 2023-02-16 NOTE — Plan of Care (Signed)

## 2023-02-16 NOTE — Discharge Instructions (Signed)
Follow with Primary MD Schmerge, Shannon Cruise, NP/R SNF physician  Get CBC, CMP, checked  by Primary MD next visit.    Activity: As tolerated with Full fall precautions use walker/cane & assistance as needed   Disposition SNF   Diet: Heart Healthy , with feeding assistance and aspiration precautions.    On your next visit with your primary care physician please Get Medicines reviewed and adjusted.   Please request your Prim.MD to go over all Hospital Tests and Procedure/Radiological results at the follow up, please get all Hospital records sent to your Prim MD by signing hospital release before you go home.   If you experience worsening of your admission symptoms, develop shortness of breath, life threatening emergency, suicidal or homicidal thoughts you must seek medical attention immediately by calling 911 or calling your MD immediately  if symptoms less severe.  You Must read complete instructions/literature along with all the possible adverse reactions/side effects for all the Medicines you take and that have been prescribed to you. Take any new Medicines after you have completely understood and accpet all the possible adverse reactions/side effects.   Do not drive, operating heavy machinery, perform activities at heights, swimming or participation in water activities or provide baby sitting services if your were admitted for syncope or siezures until you have seen by Primary MD or a Neurologist and advised to do so again.  Do not drive when taking Pain medications.    Do not take more than prescribed Pain, Sleep and Anxiety Medications  Special Instructions: If you have smoked or chewed Tobacco  in the last 2 yrs please stop smoking, stop any regular Alcohol  and or any Recreational drug use.  Wear Seat belts while driving.   Please note  You were cared for by a hospitalist during your hospital stay. If you have any questions about your discharge medications or the care you  received while you were in the hospital after you are discharged, you can call the unit and asked to speak with the hospitalist on call if the hospitalist that took care of you is not available. Once you are discharged, your primary care physician will handle any further medical issues. Please note that NO REFILLS for any discharge medications will be authorized once you are discharged, as it is imperative that you return to your primary care physician (or establish a relationship with a primary care physician if you do not have one) for your aftercare needs so that they can reassess your need for medications and monitor your lab values.

## 2023-02-16 NOTE — Plan of Care (Signed)
  Problem: Education: Goal: Knowledge of disease or condition will improve Outcome: Progressing   Problem: Ischemic Stroke/TIA Tissue Perfusion: Goal: Complications of ischemic stroke/TIA will be minimized Outcome: Progressing   Problem: Coping: Goal: Will verbalize positive feelings about self Outcome: Progressing   Problem: Health Behavior/Discharge Planning: Goal: Ability to manage health-related needs will improve Outcome: Progressing   Problem: Self-Care: Goal: Ability to participate in self-care as condition permits will improve Outcome: Progressing   Problem: Education: Goal: Knowledge of General Education information will improve Description: Including pain rating scale, medication(s)/side effects and non-pharmacologic comfort measures Outcome: Progressing   Problem: Activity: Goal: Risk for activity intolerance will decrease Outcome: Progressing

## 2023-02-16 NOTE — TOC Transition Note (Signed)
Transition of Care [facility]) - Discharge Note   Patient Details  Name: [name] MRN: [id number] Date of Birth: [birth year]  Transition [facility] CM/SW Contact:  Mearl Latin, LCSW Phone Number: 02/16/2023, 12:31 PM   Clinical Narrative:    Patient will DC to: [name] Anticipated DC date: 02/16/23 Family notified: Son, Adele Schilder Transport by: Son by car   Per MD patient ready for DC to [facility] SNF. RN to call report prior to discharge [phone] room 603b). RN, patient, patient's family, and facility notified of DC. Discharge Summary and FL2 sent to facility.   CSW will sign off for now as social work intervention is no longer needed. Please consult [place] again if new needs arise.     Final next level of care: Skilled Nursing Facility Barriers to Discharge: Barriers Resolved   Patient Goals and CMS Choice Patient states their goals for this hospitalization and ongoing recovery are:: Rehab CMS Medicare.gov Compare Post Acute Care list provided to:: Patient Choice offered to / list presented to : Patient, Adult Children [place] ownership interest in [facility].provided to:: Patient    Discharge Placement   Existing PASRR number confirmed : 02/16/23          Patient chooses bed at: [name] Patient to be transferred to facility by: car Name of family member notified: Sel Patient and family notified of of transfer: 02/16/23  Discharge Plan and Services Additional resources added to the After Visit Summary for   In-house Referral: Clinical Social Work   Post Acute Care Choice: Skilled Nursing Facility                               Social Drivers of Health (SDOH) Interventions SDOH Screenings   Food Insecurity: No Food Insecurity (02/13/2023)  Housing: Unknown (02/13/2023)  Transportation Needs: No Transportation Needs (02/13/2023)  Utilities: Not At Risk (02/13/2023)  Alcohol Screen: Low Risk  (11/14/2020)  Depression  (PHQ2-9): Low Risk  (03/27/2022)  Financial Resource Strain: Low Risk  (03/27/2022)  Physical Activity: Inactive (03/27/2022)  Social Connections: Moderately Integrated (11/14/2020)  Stress: No Stress Concern Present (03/27/2022)  Tobacco Use: Low Risk  (02/12/2023)     Readmission Risk Interventions     No data to display

## 2023-02-16 NOTE — Progress Notes (Signed)
Patient complains of right hip pain  X-ray imaging shows  Advanced right hip osteoarthritis with acetabular protrusion. No radiographic evidence of acute fracture. In the setting of advanced chronic change, consider further assessment with CT or MRI if there is persistent clinical concern for fracture  CT pelvis ordered

## 2023-02-16 NOTE — Progress Notes (Signed)
Attempted to give report twice but the assigned nurse from [facility] couldnot be reached out.

## 2023-02-19 DIAGNOSIS — I10 Essential (primary) hypertension: Secondary | ICD-10-CM | POA: Diagnosis not present

## 2023-02-19 DIAGNOSIS — E785 Hyperlipidemia, unspecified: Secondary | ICD-10-CM

## 2023-02-19 DIAGNOSIS — M1611 Unilateral primary osteoarthritis, right hip: Secondary | ICD-10-CM | POA: Diagnosis not present

## 2023-02-19 DIAGNOSIS — I25810 Atherosclerosis of coronary artery bypass graft(s) without angina pectoris: Secondary | ICD-10-CM | POA: Diagnosis not present

## 2023-02-19 DIAGNOSIS — E1122 Type 2 diabetes mellitus with diabetic chronic kidney disease: Secondary | ICD-10-CM | POA: Diagnosis not present

## 2023-02-24 ENCOUNTER — Encounter (HOSPITAL_COMMUNITY): Payer: Self-pay

## 2023-02-24 ENCOUNTER — Emergency Department (HOSPITAL_COMMUNITY)
Admission: EM | Admit: 2023-02-24 | Discharge: 2023-02-24 | Disposition: A | Discharge status: Home / Self Care | Payer: Medicare PPO | Attending: Emergency Medicine | Admitting: Emergency Medicine

## 2023-02-24 ENCOUNTER — Other Ambulatory Visit: Payer: Self-pay

## 2023-02-24 DIAGNOSIS — Z79899 Other long term (current) drug therapy: Secondary | ICD-10-CM | POA: Insufficient documentation

## 2023-02-24 DIAGNOSIS — Z8673 Personal history of transient ischemic attack (TIA), and cerebral infarction without residual deficits: Secondary | ICD-10-CM | POA: Diagnosis not present

## 2023-02-24 DIAGNOSIS — R799 Abnormal finding of blood chemistry, unspecified: Secondary | ICD-10-CM

## 2023-02-24 DIAGNOSIS — R7989 Other specified abnormal findings of blood chemistry: Secondary | ICD-10-CM | POA: Diagnosis not present

## 2023-02-24 DIAGNOSIS — Z7982 Long term (current) use of aspirin: Secondary | ICD-10-CM | POA: Insufficient documentation

## 2023-02-24 DIAGNOSIS — Z7902 Long term (current) use of antithrombotics/antiplatelets: Secondary | ICD-10-CM | POA: Diagnosis not present

## 2023-02-24 DIAGNOSIS — I129 Hypertensive chronic kidney disease with stage 1 through stage 4 chronic kidney disease, or unspecified chronic kidney disease: Secondary | ICD-10-CM | POA: Insufficient documentation

## 2023-02-24 DIAGNOSIS — E1122 Type 2 diabetes mellitus with diabetic chronic kidney disease: Secondary | ICD-10-CM | POA: Insufficient documentation

## 2023-02-24 DIAGNOSIS — N184 Chronic kidney disease, stage 4 (severe): Secondary | ICD-10-CM | POA: Insufficient documentation

## 2023-02-24 DIAGNOSIS — I2510 Atherosclerotic heart disease of native coronary artery without angina pectoris: Secondary | ICD-10-CM | POA: Diagnosis not present

## 2023-02-24 DIAGNOSIS — Z951 Presence of aortocoronary bypass graft: Secondary | ICD-10-CM | POA: Diagnosis not present

## 2023-02-24 DIAGNOSIS — N189 Chronic kidney disease, unspecified: Secondary | ICD-10-CM

## 2023-02-24 LAB — CBC
HCT: 30.2 % — ABNORMAL LOW (ref 36.0–46.0)
Hemoglobin: 9.8 g/dL — ABNORMAL LOW (ref 12.0–15.0)
MCH: 31.4 pg (ref 26.0–34.0)
MCHC: 32.5 g/dL (ref 30.0–36.0)
MCV: 96.8 fL (ref 80.0–100.0)
Platelets: 236 10*3/uL (ref 150–400)
RBC: 3.12 MIL/uL — ABNORMAL LOW (ref 3.87–5.11)
RDW: 14.2 % (ref 11.5–15.5)
WBC: 5.2 10*3/uL (ref 4.0–10.5)
nRBC: 0 % (ref 0.0–0.2)

## 2023-02-24 LAB — BASIC METABOLIC PANEL
Anion gap: 10 (ref 5–15)
BUN: 84 mg/dL — ABNORMAL HIGH (ref 8–23)
CO2: 22 mmol/L (ref 22–32)
Calcium: 9.3 mg/dL (ref 8.9–10.3)
Chloride: 101 mmol/L (ref 98–111)
Creatinine, Ser: 2.3 mg/dL — ABNORMAL HIGH (ref 0.44–1.00)
GFR, Estimated: 19 mL/min — ABNORMAL LOW (ref >60)
Glucose, Bld: 148 mg/dL — ABNORMAL HIGH (ref 70–99)
Potassium: 5.1 mmol/L (ref 3.5–5.1)
Sodium: 133 mmol/L — ABNORMAL LOW (ref 135–145)

## 2023-02-24 MED ORDER — SODIUM CHLORIDE 0.9 % IV BOLUS
500.0000 mL | Freq: Once | INTRAVENOUS | Status: AC
  Administered 2023-02-24: 500 mL via INTRAVENOUS

## 2023-02-24 NOTE — Discharge Instructions (Addendum)
 Please stop taking Jardiance (empaglifozin). Your nephrologist will be reaching out soon to schedule you for an appointment, sooner than February. Please remain well hydrated in the meantime.

## 2023-02-24 NOTE — ED Notes (Signed)
 Called [facility] ETA   HOUR

## 2023-02-24 NOTE — ED Triage Notes (Signed)
 Patient arrived by EMS from [facility] assisted living for abnormal lab values-reported elevated BUN and creatinine levels. Patient denies any illness just chronic hip pain

## 2023-02-24 NOTE — ED Provider Triage Note (Signed)
 Emergency Medicine Provider Triage Evaluation Note  [name] , a 89 y.o. female  was evaluated in triage.  Pt complains of abnormal labs.  Review of Systems   Negative: Chest pain, confusion  Physical Exam  BP (!) 142/66 (BP Location: Right Arm)   Pulse 76   Temp 98.2 F (36.8 C)   Resp 15   SpO2 100%  Gen:   Awake, no distress   Resp:  Normal effort  MSK:   Moves extremities without difficulty  Other:  None  Medical Decision Making  Medically screening exam initiated at 10:19 AM.  Appropriate orders placed.  [name] was informed that the remainder of the evaluation will be completed by another provider, this initial triage assessment does not replace that evaluation, and the importance of remaining in the ED until their evaluation is complete.  Patient is under the ER for abnormal lab. Patient thinks it is her renal function.  Will get basic labs.   [name], MD 02/24/23 1021

## 2023-02-24 NOTE — ED Provider Notes (Signed)
 [place] EMERGENCY DEPARTMENT AT [facility] Provider Note   CSN: [id number] Arrival date & time: 02/24/23  9044     History  No chief complaint on file.   [name] is a 89 y.o. female.  89 year old female with history of CKD 4, hypertension, CAD s/p CABG, CVA, diabetes mellitus type 2, rheumatoid arthritis presenting from SNF via EMS due to abnormal lab work.  Patient reports she was told that her kidney function is worsening and she had to come to the ED.  Currently denies any significant new chest pain, shortness of breath, leg swelling, confusion, weakness, fatigue.  Endorses chronic right knee pain. Denies recent fall/injury, abdominal pain, N/V, blood in stool or dark stools. Reports she is producing urine.  Patient was recently admitted in December 12/26-12/30 for acute CVA.  At that time her creatinine was around 2.15 which was noted to be her baseline.  She saw her nephrologist ([facility] Dr. Dennise) earlier on 12/18 at which time she was continued on her jardiance  and advised to follow up with palliative care. Current BP meds include coreg  and amlodipine . She does not take any diuretics. No recent use of NSAIDs. Has been tolerating food/drink, no changes to urine or BM recently.        Home Medications Prior to Admission medications   Medication Sig Start Date End Date Taking? Authorizing Provider  Accu-Chek Softclix Lancets lancets Use as instructed to check blood sugars up to 3 times a day  Dx code e11.65 05/17/19   Jarold Medici, MD  acetaminophen  (TYLENOL ) 325 MG tablet Take 2 tablets (650 mg total) by mouth every 6 (six) hours as needed for mild pain (pain score 1-3), moderate pain (pain score 4-6), fever or headache (or temp > 37.5 C (99.5 F)). 02/16/23   Elgergawy, Brayton RAMAN, MD  amLODipine  (NORVASC ) 10 MG tablet Take 1 tablet (10 mg total) by mouth daily. 02/16/23 02/16/24  Elgergawy, Brayton RAMAN, MD  aspirin  EC 81 MG tablet Take 1 tablet (81 mg  total) by mouth daily for 19 days. Swallow whole. Please stop on 1/18 02/16/23 03/07/23  Elgergawy, Brayton RAMAN, MD  Blood Glucose Calibration (ACCU-CHEK AVIVA) SOLN Use with accu-check aviva machine 06/02/19   Jarold Medici, MD  Blood Glucose Monitoring Suppl (ACCU-CHEK AVIVA PLUS) w/Device KIT Use to check  Blood sugars up to 3 times a day.  Dx code e11.65 12/10/20   Jarold Medici, MD  carvedilol  (COREG ) 6.25 MG tablet Take 1 tablet (6.25 mg total) by mouth 2 (two) times daily. 02/16/23 02/16/24  Elgergawy, Brayton RAMAN, MD  clopidogrel  (PLAVIX ) 75 MG tablet Take 1 tablet (75 mg total) by mouth daily. 02/17/23   Elgergawy, Brayton RAMAN, MD  fluticasone  (FLONASE ) 50 MCG/ACT nasal spray Place 2 sprays into both nostrils daily as needed for rhinitis. 06/03/22   [provider]  glucose blood test strip Use as instructed to check blood sugars up to 3 times a day.  Dx code e11.65 05/17/19   Jarold Medici, MD  JARDIANCE  10 MG TABS tablet Take 10 mg by mouth daily. 02/01/21   [provider]  Multiple Vitamin (MULTIVITAMIN) capsule Take 1 capsule by mouth daily.    [provider]  nitroGLYCERIN  (NITROSTAT ) 0.4 MG SL tablet PLACE 1 TABLET UNDER THE TONGUE AT THE FIRST SIGN OF ATTACK, MAY REPEAT EVERY 5 MINUTES FOR 3 DOSES IN 15 MINUTES. IF PAIN PERSISTS CALL 911 Patient taking differently: Place 0.4 mg under the tongue every 5 (five) minutes  as needed for chest pain. PLACE 1 TABLET UNDER THE TONGUE AT THE FIRST SIGN OF ATTACK, MAY REPEAT EVERY 5 MINUTES FOR 3 DOSES IN 15 MINUTES. IF PAIN PERSISTS CALL 911 08/24/20   Jarold Medici, MD  pantoprazole  (PROTONIX ) 40 MG tablet Take 1 tablet (40 mg total) by mouth daily. 02/17/23   Elgergawy, Brayton RAMAN, MD  polyethylene glycol (MIRALAX  / GLYCOLAX ) 17 g packet Take 17 g by mouth daily as needed for mild constipation. 08/13/22   Danford, Lonni SQUIBB, MD  rosuvastatin  (CRESTOR ) 10 MG tablet Take 1 tablet (10 mg total) by mouth daily. 02/17/23    Elgergawy, Brayton RAMAN, MD      Allergies    Patient has no known allergies.    Review of Systems   Review of Systems  Constitutional:  Negative for appetite change, fatigue and fever.  HENT:  Positive for rhinorrhea.   Respiratory:  Negative for cough and shortness of breath.   Cardiovascular:  Negative for chest pain, palpitations and leg swelling.  Gastrointestinal:  Negative for abdominal pain, blood in stool, constipation, diarrhea, nausea and vomiting.  Genitourinary:  Negative for difficulty urinating, dysuria and hematuria.  Musculoskeletal:  Positive for arthralgias.  Neurological:  Negative for dizziness, syncope, weakness, light-headedness and headaches.    Physical Exam Updated Vital Signs BP (!) 158/60 (BP Location: Right Arm)   Pulse 70   Temp 99.1 F (37.3 C) (Oral)   Resp 16   SpO2 100%  Physical Exam Constitutional:      General: She is not in acute distress. HENT:     Head: Normocephalic and atraumatic.     Mouth/Throat:     Mouth: Mucous membranes are moist.     Pharynx: Oropharynx is clear.  Eyes:     Extraocular Movements: Extraocular movements intact.     Pupils: Pupils are equal, round, and reactive to light.  Cardiovascular:     Rate and Rhythm: Normal rate and regular rhythm.     Heart sounds: Normal heart sounds. No murmur heard. Pulmonary:     Effort: Pulmonary effort is normal. No respiratory distress.     Breath sounds: Normal breath sounds. No wheezing, rhonchi or rales.  Abdominal:     General: Abdomen is flat. There is no distension.     Palpations: Abdomen is soft.     Tenderness: There is no abdominal tenderness.  Musculoskeletal:     Cervical back: Neck supple.     Right lower leg: No edema.     Left lower leg: No edema.  Skin:    General: Skin is warm and dry.  Neurological:     Mental Status: She is alert and oriented to person, place, and time. Mental status is at baseline.     ED Results / Procedures / Treatments    Labs (all labs ordered are listed, but only abnormal results are displayed) Labs Reviewed  BASIC METABOLIC PANEL - Abnormal; Notable for the following components:      Result Value   Sodium 133 (*)    Glucose, Bld 148 (*)    BUN 84 (*)    Creatinine, Ser 2.30 (*)    GFR, Estimated 19 (*)    All other components within normal limits  CBC - Abnormal; Notable for the following components:   RBC 3.12 (*)    Hemoglobin 9.8 (*)    HCT 30.2 (*)    All other components within normal limits  URINALYSIS, ROUTINE W REFLEX MICROSCOPIC  EKG None  Radiology No results found.  Procedures Procedures    Medications Ordered in ED Medications - No data to display  ED Course/ Medical Decision Making/ A&P                                 Medical Decision Making 89 year old female history of CKD 4, CAD s/p CABG, CVA, hypertension, T2DM, RA presenting from SNF due to abnormal lab work with concern for worsening renal function.  Slightly hypertensive on arrival.  BMP here shows creatinine 2.3 with BUN 84, slight hyponatremia to 133.  CBC with slight anemia at 9.8.   She does not have an AKI and her GFR of 19 is roughly at baseline (Baseline Cr around 2.15 and GFR around 20).  She does not have symptoms of uremia, is producing urine, and does not have significant electrolyte abnormalities or fluid overload to suggest need for acute dialysis.  Upon chart review per most recent nephrology visit the decision was made to not pursue dialysis.  Additional consideration for GI bleed given elevated BUN with drop in hemoglobin, though patient has not had any abdominal pain, bloody/dark stool, and has an unremarkable abdominal exam, so low suspicion for this.  Patient has f/u scheduled with nephrology in February - will discuss with them to see if she needs to be seen sooner and/or any additional recommendations.   Discussed with Dr. Jerrye - recommends stopping Jardiance  upon discharge, give 500cc  bolus here, and their office will reach out to reschedule her appointment for sooner than February. Patient signed out to Dr. Charlyn.  Amount and/or Complexity of Data Reviewed Labs: ordered.          Final Clinical Impression(s) / ED Diagnoses Final diagnoses:  None    Rx / DC Orders ED Discharge Orders     None         Romelle Booty, MD 02/24/23 1556    [name], MD 02/24/23 1624

## 2023-02-24 NOTE — ED Notes (Signed)
 Unsuccessful attempt at reaching facility for report. Message left.

## 2023-03-07 ENCOUNTER — Encounter (HOSPITAL_BASED_OUTPATIENT_CLINIC_OR_DEPARTMENT_OTHER): Payer: Self-pay

## 2023-03-07 ENCOUNTER — Emergency Department (HOSPITAL_BASED_OUTPATIENT_CLINIC_OR_DEPARTMENT_OTHER)
Admission: EM | Admit: 2023-03-07 | Discharge: 2023-03-07 | Disposition: A | Discharge status: Home / Self Care | Payer: Medicare PPO | Attending: Emergency Medicine | Admitting: Emergency Medicine

## 2023-03-07 ENCOUNTER — Emergency Department (HOSPITAL_BASED_OUTPATIENT_CLINIC_OR_DEPARTMENT_OTHER): Payer: Medicare PPO

## 2023-03-07 ENCOUNTER — Other Ambulatory Visit: Payer: Self-pay

## 2023-03-07 DIAGNOSIS — K5900 Constipation, unspecified: Secondary | ICD-10-CM | POA: Insufficient documentation

## 2023-03-07 DIAGNOSIS — M1611 Unilateral primary osteoarthritis, right hip: Secondary | ICD-10-CM | POA: Insufficient documentation

## 2023-03-07 DIAGNOSIS — Z7902 Long term (current) use of antithrombotics/antiplatelets: Secondary | ICD-10-CM | POA: Insufficient documentation

## 2023-03-07 DIAGNOSIS — R109 Unspecified abdominal pain: Secondary | ICD-10-CM | POA: Diagnosis present

## 2023-03-07 DIAGNOSIS — Z7982 Long term (current) use of aspirin: Secondary | ICD-10-CM | POA: Diagnosis not present

## 2023-03-07 LAB — CBC WITH DIFFERENTIAL/PLATELET
Abs Immature Granulocytes: 0.03 10*3/uL (ref 0.00–0.07)
Basophils Absolute: 0 10*3/uL (ref 0.0–0.1)
Basophils Relative: 1 %
Eosinophils Absolute: 0.2 10*3/uL (ref 0.0–0.5)
Eosinophils Relative: 2 %
HCT: 27.2 % — ABNORMAL LOW (ref 36.0–46.0)
Hemoglobin: 9.2 g/dL — ABNORMAL LOW (ref 12.0–15.0)
Immature Granulocytes: 1 %
Lymphocytes Relative: 10 %
Lymphs Abs: 0.7 10*3/uL (ref 0.7–4.0)
MCH: 31.4 pg (ref 26.0–34.0)
MCHC: 33.8 g/dL (ref 30.0–36.0)
MCV: 92.8 fL (ref 80.0–100.0)
Monocytes Absolute: 0.8 10*3/uL (ref 0.1–1.0)
Monocytes Relative: 13 %
Neutro Abs: 4.9 10*3/uL (ref 1.7–7.7)
Neutrophils Relative %: 73 %
Platelets: 304 10*3/uL (ref 150–400)
RBC: 2.93 MIL/uL — ABNORMAL LOW (ref 3.87–5.11)
RDW: 13.1 % (ref 11.5–15.5)
WBC: 6.6 10*3/uL (ref 4.0–10.5)
nRBC: 0 % (ref 0.0–0.2)

## 2023-03-07 LAB — COMPREHENSIVE METABOLIC PANEL
ALT: 15 U/L (ref 0–44)
AST: 22 U/L (ref 15–41)
Albumin: 3.3 g/dL — ABNORMAL LOW (ref 3.5–5.0)
Alkaline Phosphatase: 90 U/L (ref 38–126)
Anion gap: 12 (ref 5–15)
BUN: 73 mg/dL — ABNORMAL HIGH (ref 8–23)
CO2: 25 mmol/L (ref 22–32)
Calcium: 9.5 mg/dL (ref 8.9–10.3)
Chloride: 93 mmol/L — ABNORMAL LOW (ref 98–111)
Creatinine, Ser: 1.95 mg/dL — ABNORMAL HIGH (ref 0.44–1.00)
GFR, Estimated: 24 mL/min — ABNORMAL LOW (ref >60)
Glucose, Bld: 120 mg/dL — ABNORMAL HIGH (ref 70–99)
Potassium: 5.1 mmol/L (ref 3.5–5.1)
Sodium: 130 mmol/L — ABNORMAL LOW (ref 135–145)
Total Bilirubin: 0.6 mg/dL (ref 0.0–1.2)
Total Protein: 6.8 g/dL (ref 6.5–8.1)

## 2023-03-07 LAB — URINALYSIS, MICROSCOPIC (REFLEX): RBC / HPF: NONE SEEN RBC/hpf (ref 0–5)

## 2023-03-07 LAB — URINALYSIS, ROUTINE W REFLEX MICROSCOPIC
Bilirubin Urine: NEGATIVE
Glucose, UA: NEGATIVE mg/dL
Ketones, ur: NEGATIVE mg/dL
Nitrite: NEGATIVE
Protein, ur: NEGATIVE mg/dL
Specific Gravity, Urine: 1.015 (ref 1.005–1.030)
pH: 8.5 — ABNORMAL HIGH (ref 5.0–8.0)

## 2023-03-07 LAB — LIPASE, BLOOD: Lipase: 56 U/L — ABNORMAL HIGH (ref 11–51)

## 2023-03-07 MED ORDER — TRAMADOL HCL 50 MG PO TABS
50.0000 mg | ORAL_TABLET | Freq: Once | ORAL | Status: AC
  Administered 2023-03-07: 50 mg via ORAL
  Filled 2023-03-07: qty 1

## 2023-03-07 MED ORDER — FENTANYL CITRATE PF 50 MCG/ML IJ SOSY
25.0000 ug | PREFILLED_SYRINGE | Freq: Once | INTRAMUSCULAR | Status: AC
  Administered 2023-03-07: 25 ug via INTRAVENOUS
  Filled 2023-03-07: qty 1

## 2023-03-07 NOTE — ED Triage Notes (Signed)
Pt reports she was constipated after she came home from rehab yesterday so she took Miralax. Pt reports stool started coming out in little balls yesterday so she took more Miralax. Pt reports she took Miralax about 3 times yesterday. She went to sleep after 1:00 am and still felt pressure to go. Pt reports she woke up this morning in misery so her son called 911.   Pt presents to ED with large amount of soft brown stool in her brief.

## 2023-03-07 NOTE — Discharge Instructions (Signed)
Continues the MiraLAX 1-2 times per day as needed for constipation.  You may also use Colace to help soften the stools.  Be sure to drink the appropriate amount of fluids.  This appears to be what was causing your abdominal pain.  Your x-ray shows you have significant arthritis in your hip.  We are referring you to an orthopedist, Dr. Yevette Edwards.  Call their office on Monday to set up an appointment as an outpatient.  Otherwise, continue your home medications.  If you develop new or worsening abdominal pain, hip pain, fever, numbness, weakness, or any other new/concerning symptoms then return to the ER or call 911.

## 2023-03-07 NOTE — ED Provider Notes (Signed)
[place] [facility] Provider Note   CSN: [id number] Arrival date & time: 03/07/23  1007     History  Chief Complaint  Patient presents with   Constipation   Abdominal Pain    Julie Yeila Licano is a 89 y.o. female.  HPI 89 year old female presents with abdominal pain and constipation.  Symptoms started yesterday.  She felt like she was having a hard time having a bowel movement and only had small output.  Took MiraLAX a couple different times over the last 24 hours.  Still feeling constipated and having generalized abdominal discomfort, worst in the lower abdomen.  On arrival here, patient has had a large bowel movement without blood according to the nurse.  The patient feels like the pressure is better but she still has abdominal pain.  No vomiting, chest pain, back pain or urinary symptoms.  After the initial assessment she later started complaining of acute on chronic right hip pain.  Has been dealing with sciatica down that leg for a year+.  Home Medications Prior to Admission medications   Medication Sig Start Date End Date Taking? Authorizing Provider  Accu-Chek Softclix Lancets lancets Use as instructed to check blood sugars up to 3 times a day  Dx code e11.65 05/17/19   Dorothyann Peng, MD  acetaminophen (TYLENOL) 325 MG tablet Take 2 tablets (650 mg total) by mouth every 6 (six) hours as needed for mild pain (pain score 1-3), moderate pain (pain score 4-6), fever or headache (or temp > 37.5 C (99.5 F)). 02/16/23   Elgergawy, Leana Roe, MD  amLODipine (NORVASC) 10 MG tablet Take 1 tablet (10 mg total) by mouth daily. 02/16/23 02/16/24  Elgergawy, Leana Roe, MD  aspirin EC 81 MG tablet Take 1 tablet (81 mg total) by mouth daily for 19 days. Swallow whole. Please stop on 1/18 02/16/23 03/07/23  Elgergawy, Leana Roe, MD  Blood Glucose Calibration (ACCU-CHEK AVIVA) SOLN Use with accu-check aviva machine 06/02/19   Dorothyann Peng, MD  Blood  Glucose Monitoring Suppl (ACCU-CHEK AVIVA PLUS) w/Device KIT Use to check  Blood sugars up to 3 times a day.  Dx code e11.65 12/10/20   Dorothyann Peng, MD  carvedilol (COREG) 6.25 MG tablet Take 1 tablet (6.25 mg total) by mouth 2 (two) times daily. 02/16/23 02/16/24  Elgergawy, Leana Roe, MD  clopidogrel (PLAVIX) 75 MG tablet Take 1 tablet (75 mg total) by mouth daily. 02/17/23   Elgergawy, Leana Roe, MD  fluticasone (FLONASE) 50 MCG/ACT nasal spray Place 2 sprays into both nostrils daily as needed for rhinitis. 06/03/22   [provider]  glucose blood test strip Use as instructed to check blood sugars up to 3 times a day.  Dx code e11.65 05/17/19   Dorothyann Peng, MD  Multiple Vitamin (MULTIVITAMIN) capsule Take 1 capsule by mouth daily.    [provider]  nitroGLYCERIN (NITROSTAT) 0.4 MG SL tablet PLACE 1 TABLET UNDER THE TONGUE AT THE FIRST SIGN OF ATTACK, MAY REPEAT EVERY 5 MINUTES FOR 3 DOSES IN 15 MINUTES. IF PAIN PERSISTS CALL 911 Patient taking differently: Place 0.4 mg under the tongue every 5 (five) minutes as needed for chest pain. PLACE 1 TABLET UNDER THE TONGUE AT THE FIRST SIGN OF ATTACK, MAY REPEAT EVERY 5 MINUTES FOR 3 DOSES IN 15 MINUTES. IF PAIN PERSISTS CALL 911 08/24/20   Dorothyann Peng, MD  pantoprazole (PROTONIX) 40 MG tablet Take 1 tablet (40 mg total) by mouth daily. 02/17/23   Elgergawy,  Leana Roe, MD  polyethylene glycol (MIRALAX / GLYCOLAX) 17 g packet Take 17 g by mouth daily as needed for mild constipation. 08/13/22   Danford, Earl Lites, MD  rosuvastatin (CRESTOR) 10 MG tablet Take 1 tablet (10 mg total) by mouth daily. 02/17/23   Elgergawy, Leana Roe, MD      Allergies    Patient has no known allergies.    Review of Systems   Review of Systems  Cardiovascular:  Negative for chest pain.  Gastrointestinal:  Positive for abdominal pain and constipation. Negative for vomiting.  Genitourinary:  Negative for dysuria.    Physical Exam Updated Vital  Signs BP (!) 170/60   Pulse 67   Temp 97.6 F (36.4 C) (Oral)   Resp 16   Ht 5\' 4"  (1.626 m)   Wt 54.3 kg   SpO2 98%   BMI 20.55 kg/m  Physical Exam Vitals and nursing note reviewed.  Constitutional:      General: She is not in acute distress.    Appearance: She is well-developed. She is not ill-appearing or diaphoretic.  HENT:     Head: Normocephalic and atraumatic.  Cardiovascular:     Rate and Rhythm: Normal rate and regular rhythm.     Heart sounds: Normal heart sounds.  Pulmonary:     Effort: Pulmonary effort is normal.  Abdominal:     Palpations: Abdomen is soft.     Tenderness: There is generalized abdominal tenderness.  Skin:    General: Skin is warm and dry.  Neurological:     Mental Status: She is alert.     ED Results / Procedures / Treatments   Labs (all labs ordered are listed, but only abnormal results are displayed) Labs Reviewed  COMPREHENSIVE METABOLIC PANEL - Abnormal; Notable for the following components:      Result Value   Sodium 130 (*)    Chloride 93 (*)    Glucose, Bld 120 (*)    BUN 73 (*)    Creatinine, Ser 1.95 (*)    Albumin 3.3 (*)    GFR, Estimated 24 (*)    All other components within normal limits  LIPASE, BLOOD - Abnormal; Notable for the following components:   Lipase 56 (*)    All other components within normal limits  CBC WITH DIFFERENTIAL/PLATELET - Abnormal; Notable for the following components:   RBC 2.93 (*)    Hemoglobin 9.2 (*)    HCT 27.2 (*)    All other components within normal limits  URINALYSIS, ROUTINE W REFLEX MICROSCOPIC - Abnormal; Notable for the following components:   Color, Urine STRAW (*)    APPearance HAZY (*)    pH 8.5 (*)    Hgb urine dipstick TRACE (*)    Leukocytes,Ua SMALL (*)    All other components within normal limits  URINALYSIS, MICROSCOPIC (REFLEX) - Abnormal; Notable for the following components:   Bacteria, UA MANY (*)    Non Squamous Epithelial PRESENT (*)    All other components  within normal limits  URINE CULTURE    EKG EKG Interpretation Date/Time:  Saturday March 07 2023 11:22:47 EST Ventricular Rate:  66 PR Interval:  166 QRS Duration:  107 QT Interval:  429 QTC Calculation: 450 R Axis:   5  Text Interpretation: Sinus rhythm Probable left ventricular hypertrophy Anterior Q waves, possibly due to LVH similar to Dec 2024 Confirmed by Pricilla Loveless [phone]) on 03/07/2023 11:25:24 AM  Radiology CT ABDOMEN PELVIS WO CONTRAST Result Date: 03/07/2023 CLINICAL  DATA:  89 year old female with history of acute onset of nonlocalized abdominal pain. EXAM: CT ABDOMEN AND PELVIS WITHOUT CONTRAST TECHNIQUE: Multidetector CT imaging of the abdomen and pelvis was performed following the standard protocol without IV contrast. RADIATION DOSE REDUCTION: This exam was performed according to the departmental dose-optimization program which includes automated exposure control, adjustment of the mA and/or kV according to patient size and/or use of iterative reconstruction technique. COMPARISON:  No priors. FINDINGS: Lower chest: Atherosclerotic calcifications are noted in the descending thoracic aorta as well as the left anterior descending and right coronary artery. Severe calcifications of the mitral annulus. Scarring in the lung bases bilaterally. Hepatobiliary: No definite suspicious cystic or solid hepatic lesions are confidently identified on today's noncontrast CT examination. High attenuation lying dependently in the gallbladder indicative of biliary sludge and/or tiny calcified gallstones. Gallbladder is moderately distended. No definite pericholecystic fluid or surrounding inflammatory changes to suggest an acute cholecystitis at this time. Pancreas: No definite pancreatic mass or peripancreatic fluid collections or inflammatory changes noted on today's noncontrast CT examination. Spleen: Unremarkable. Adrenals/Urinary Tract: 1.9 cm low-attenuation lesion in the posterior aspect of  the interpolar region of the left kidney, incompletely characterized on today's noncontrast CT examination, but statistically likely to represent a cyst (no imaging follow-up recommended). Unenhanced appearance of the right kidney and bilateral adrenal glands are normal. No hydroureteronephrosis. Urinary bladder is unremarkable in appearance. Stomach/Bowel: Unenhanced appearance of the stomach is normal. No pathologic dilatation of small bowel or colon. Numerous colonic diverticula are noted, without surrounding inflammatory changes to suggest an acute diverticulitis at this time. The appendix is not confidently identified and may be surgically absent. Regardless, there are no inflammatory changes noted adjacent to the cecum to suggest the presence of an acute appendicitis at this time. Vascular/Lymphatic: Atherosclerotic calcifications are noted in the abdominal aorta and pelvic vasculature. No lymphadenopathy noted in the abdomen or pelvis. Reproductive: Uterus and ovaries are atrophic and otherwise unremarkable in appearance. Other: No significant volume of ascites.  No pneumoperitoneum. Musculoskeletal: Chronic appearing compression fracture of L1 with 50% loss of anterior vertebral body height. There are no aggressive appearing lytic or blastic lesions noted in the visualized portions of the skeleton. IMPRESSION: 1. No acute findings are noted in the abdomen or pelvis to account for the patient's symptoms. 2. Colonic diverticulosis without evidence of acute diverticulitis at this time. 3. Biliary sludge and/or tiny calcified gallstones lying dependently in the gallbladder. No findings to suggest an acute cholecystitis at this time. 4. Aortic atherosclerosis, in addition to at least 2 vessel coronary artery disease. 5. There are calcifications of the mitral annulus. Echocardiographic correlation for evaluation of potential valvular dysfunction may be warranted if clinically indicated. Aortic Atherosclerosis  (ICD10-I70.0). Electronically Signed   By: Trudie Reed M.D.   On: 03/07/2023 12:55    Procedures Procedures    Medications Ordered in ED Medications  traMADol (ULTRAM) tablet 50 mg (has no administration in time range)  fentaNYL (SUBLIMAZE) injection 25 mcg (25 mcg Intravenous Given 03/07/23 1151)  fentaNYL (SUBLIMAZE) injection 25 mcg (25 mcg Intravenous Given 03/07/23 1352)    ED Course/ Medical Decision Making/ A&P                                 Medical Decision Making Amount and/or Complexity of Data Reviewed Labs: ordered.    Details: Urine is probably contaminated.  Her creatinine is improved from baseline.  Stable  anemia. Radiology: ordered and independent interpretation performed.    Details: No bowel obstruction.  No hip fracture. ECG/medicine tests: ordered and independent interpretation performed.    Details: No change from baseline.  Risk Prescription drug management.   At first patient declined pain medicine but then did require some as needed fentanyl to help with some breakthrough pain.  I think this pain from her hip is coming from the significant osteoarthritis seen on x-ray.  CT is unremarkable and I suspect her abdominal discomfort was from the constipation itself.  She is doing better and having bowel movements and so I think she is stable for discharge home with continued supportive care for her constipation.  Discussed with her family member for this as well.  Otherwise she is on tramadol for chronic pain and she can continue this and she has never seen an orthopedist for her hip issues and so I will refer her to orthopedics.  Appears stable for discharge home with return precautions.        Final Clinical Impression(s) / ED Diagnoses Final diagnoses:  Osteoarthritis of right hip, unspecified osteoarthritis type  Constipation, unspecified constipation type    Rx / DC Orders ED Discharge Orders     None         Pricilla Loveless,  MD 03/07/23 1453

## 2023-03-07 NOTE — ED Notes (Signed)
Patient transported to X-ray 

## 2023-03-10 LAB — URINE CULTURE: Culture: >=100000 — AB

## 2023-03-11 ENCOUNTER — Telehealth (HOSPITAL_BASED_OUTPATIENT_CLINIC_OR_DEPARTMENT_OTHER): Payer: Self-pay

## 2023-03-11 NOTE — Telephone Encounter (Signed)
Post ED Visit - Positive Culture Follow-up  Culture report reviewed by antimicrobial stewardship pharmacist: [facility] [x]  Calton Dach, Pharm.D. []  Celedonio Miyamoto, Pharm.D., BCPS AQ-ID []  Garvin Fila, Pharm.D., BCPS []  Georgina Pillion, [address].D., BCPS []  [place], [address].D., BCPS, AAHIVP []  Estella Husk, Pharm.D., BCPS, AAHIVP []  Lysle Pearl, PharmD, BCPS []  Phillips Climes, PharmD, BCPS []  Agapito Games, PharmD, BCPS []  Verlan Friends, PharmD []  Mervyn Gay, PharmD, BCPS []  Vinnie Level, PharmD  [facility] []  Len Childs, PharmD []  Greer Pickerel, PharmD []  Adalberto Cole, PharmD []  Perlie Gold, Rph []  Lonell Face) Jean Rosenthal, PharmD []  Earl Many, PharmD []  Junita Push, PharmD []  Dorna Leitz, PharmD []  Terrilee Files, PharmD []  Lynann Beaver, PharmD []  Keturah Barre, PharmD []  Loralee Pacas, PharmD []  Bernadene Person, PharmD   Positive urine culture Reviewed by ED provider Lavena Stanford, MD  No S/s noted, had BM and pain resolved. No treatment needed and no further patient follow-up is required at this time.  Sandria Senter 03/11/2023, 9:53 AM

## 2023-03-17 NOTE — Progress Notes (Signed)
PATIENT: [name] DOB: 01-Jul-1927  REASON FOR VISIT: follow up HISTORY FROM: patient PRIMARY NEUROLOGIST: Dr. Pearlean Brownie  Chief Complaint  Patient presents with   RM 18    Patient is here with her son for hospital follow-up. She states the left side of her face, both hands and feet are numb since the stroke. She feels her extremities are weak as well. She had a stroke in 2003 as well but no residual symptoms. She feels her vision and speech are pretty normal. She states she has some hesitations when she speaks. Her appetite is good. Her hands are both cool. She recently had injections in hip and shoulder for arthritis which were effective.     HISTORY OF PRESENT ILLNESS: Today 03/17/23  [name] is a 89 y.o. female here for hospital follow-up after 11 mm acute infarct in the right thalamocapsular junction.  Patient reports that since the stroke she feels numbness in the left side of the face and both hands and both extremities.  She is now just on Plavix.  She remains on Crestor.  Overall she feels weaker after the stroke.  Would like to do physical therapy.  She lives at home alone.  Her son lives nearby.  She states that she uses her wheelchair primarily to get around her house.  Her MRI did show 2 mm edition likely aneurysm the left internal carotid artery.  She returns today for an evaluation.  MRA head IMPRESSION: 1. No proximal intracranial large vessel occlusion identified. 2. Severe stenosis within a proximal M2 left middle cerebral artery vessel. 3. 2 mm inferiorly projecting vascular protrusion arising from the cavernous left internal carotid artery, likely reflecting an aneurysm.  HISTORY Acute Ischemic Infarct: 11 mm acute infarct in the right thalamocapsular junction Etiology: small vessel disease   CT head: No acute intracranial abnormality or significant interval change.  Remote lacunar infarcts of the basal ganglia and corona radiata  bilaterally are stable. MRI: 11 mm acute infarct at the right thalamic capsular junction.   MRA Severe stenosis within a proximal left M2 2D Echo LVEF 60-65% LDL 95 HgbA1c 6.4 VTE prophylaxis -SQ heparin aspirin 81 mg daily prior to admission, now on aspirin 81 mg daily and clopidogrel 75 mg daily for 3 weeks and then clopidogrel alone. Therapy recommendations:  Pending Disposition:  pending   Hx of Stroke/TIA Patient stated that she had a stroke in 2003 resolved residual deficit Admitted 07/2022 for left hand weakness.  MRI and MRA negative.  Carotid Doppler negative.  EF 60 to 65%.  LDL 91, A1c 6.4.  Considered possible MRI negative infarct.  Discharged on DAPT and Crestor 20.      REVIEW OF SYSTEMS: Out of a complete 14 system review of symptoms, the patient complains only of the following symptoms, and all other reviewed systems are negative.  ALLERGIES: No Known Allergies  HOME MEDICATIONS: Outpatient Medications Prior to Visit  Medication Sig Dispense Refill   Accu-Chek Softclix Lancets lancets Use as instructed to check blood sugars up to 3 times a day  Dx code e11.65 100 each 12   acetaminophen (TYLENOL) 325 MG tablet Take 2 tablets (650 mg total) by mouth every 6 (six) hours as needed for mild pain (pain score 1-3), moderate pain (pain score 4-6), fever or headache (or temp > 37.5 C (99.5 F)).     amLODipine (NORVASC) 10 MG tablet Take 1 tablet (10 mg total) by mouth daily.     Blood  Glucose Calibration (ACCU-CHEK AVIVA) SOLN Use with accu-check aviva machine 1 each 3   Blood Glucose Monitoring Suppl (ACCU-CHEK AVIVA PLUS) w/Device KIT Use to check  Blood sugars up to 3 times a day.  Dx code e11.65 1 kit 3   carvedilol (COREG) 6.25 MG tablet Take 1 tablet (6.25 mg total) by mouth 2 (two) times daily.     clopidogrel (PLAVIX) 75 MG tablet Take 1 tablet (75 mg total) by mouth daily.     fluticasone (FLONASE) 50 MCG/ACT nasal spray Place 2 sprays into both nostrils daily as  needed for rhinitis.     glucose blood test strip Use as instructed to check blood sugars up to 3 times a day.  Dx code e11.65 100 each 12   Multiple Vitamin (MULTIVITAMIN) capsule Take 1 capsule by mouth daily.     nitroGLYCERIN (NITROSTAT) 0.4 MG SL tablet PLACE 1 TABLET UNDER THE TONGUE AT THE FIRST SIGN OF ATTACK, MAY REPEAT EVERY 5 MINUTES FOR 3 DOSES IN 15 MINUTES. IF PAIN PERSISTS CALL 911 (Patient taking differently: Place 0.4 mg under the tongue every 5 (five) minutes as needed for chest pain. PLACE 1 TABLET UNDER THE TONGUE AT THE FIRST SIGN OF ATTACK, MAY REPEAT EVERY 5 MINUTES FOR 3 DOSES IN 15 MINUTES. IF PAIN PERSISTS CALL 911) 50 tablet 0   pantoprazole (PROTONIX) 40 MG tablet Take 1 tablet (40 mg total) by mouth daily.     polyethylene glycol (MIRALAX / GLYCOLAX) 17 g packet Take 17 g by mouth daily as needed for mild constipation. 14 each 0   rosuvastatin (CRESTOR) 10 MG tablet Take 1 tablet (10 mg total) by mouth daily.     No facility-administered medications prior to visit.    PAST MEDICAL HISTORY: Past Medical History:  Diagnosis Date   Cerebrovascular disease, unspecified    Coronary atherosclerosis of unspecified type of vessel, native or graft    CVA (cerebral vascular accident) (HCC)    Diabetes mellitus without complication (HCC)    Other and unspecified hyperlipidemia    Rheumatoid arthritis(714.0)    Shingles 2003   Unspecified essential hypertension     PAST SURGICAL HISTORY: Past Surgical History:  Procedure Laterality Date   Cardiac Bypass  2003   CATARACT EXTRACTION, BILATERAL  2007    FAMILY HISTORY: Family History  Problem Relation Age of Onset   Cancer Mother    Hypertension Father    Cancer Brother    Hypertension Paternal Grandmother    Heart disease Brother    Diabetes Neg Hx    Coronary artery disease Neg Hx     SOCIAL HISTORY: Social History   Socioeconomic History   Marital status: Widowed    Spouse name: Not on file   Number  of children: Not on file   Years of education: Not on file   Highest education level: Not on file  Occupational History   Occupation: retired  Tobacco Use   Smoking status: Never   Smokeless tobacco: Never  Vaping Use   Vaping status: Never Used  Substance and Sexual Activity   Alcohol use: No   Drug use: No   Sexual activity: Not Currently  Other Topics Concern   Not on file  Social History Narrative   Retired   Tobacco use- no   Alcohol use-no         Social Drivers of Corporate investment banker Strain: Low Risk  (03/27/2022)   Overall Financial Resource Strain (CARDIA)  Difficulty of Paying Living Expenses: Not hard at all  Food Insecurity: No Food Insecurity (02/13/2023)   Hunger Vital Sign    Worried About Running Out of Food in the Last Year: Never true    Ran Out of Food in the Last Year: Never true  Transportation Needs: No Transportation Needs (02/13/2023)   PRAPARE - Administrator, Civil Service (Medical): No    Lack of Transportation (Non-Medical): No  Physical Activity: Inactive (03/27/2022)   Exercise Vital Sign    Days of Exercise per Week: 0 days    Minutes of Exercise per Session: 0 min  Stress: No Stress Concern Present (03/27/2022)   [employer] of Occupational Health - Occupational Stress Questionnaire    Feeling of Stress : Not at all  Social Connections: Moderately Integrated (11/14/2020)   Social Connection and Isolation Panel [NHANES]    Frequency of Communication with Friends and Family: More than three times a week    Frequency of Social Gatherings with Friends and Family: Once a week    Attends Religious Services: More than 4 times per year    Active Member of [employer] or Organizations: Yes    Attends Banker Meetings: Never    Marital Status: Widowed  Intimate Partner Violence: Not At Risk (02/13/2023)   Humiliation, Afraid, Rape, and Kick questionnaire    Fear of Current or Ex-Partner: No    Emotionally Abused:  No    Physically Abused: No    Sexually Abused: No      PHYSICAL EXAM  Vitals:   03/19/23 0911 03/19/23 1020  BP: (!) 172/68 (!) 172/68  Pulse: 70   Weight: 110 lb (49.9 kg)   Height: 4\' 11"  (1.499 m)    Body mass index is 22.22 kg/m.  Generalized: Well developed, in no acute distress   Neurological examination  Mentation: Alert oriented to time, place, history taking. Follows all commands speech and language fluent Cranial nerve II-XII: Pupils were equal round reactive to light. Extraocular movements were full, visual field were full on confrontational test. Facial sensation and strength were normal. Uvula tongue midline. Head turning and shoulder shrug  were normal and symmetric. Motor: The motor testing reveals 4 over 5 strength of all 4 extremities. Good symmetric motor tone is noted throughout.  Sensory: Sensory testing is intact to soft touch on all 4 extremities.  States that it feels the same on both sides.  No evidence of extinction is noted.  Coordination: Cerebellar testing reveals good finger-nose-finger and heel-to-shin bilaterally.  Gait and station: In a wheelchair today   DIAGNOSTIC DATA (LABS, IMAGING, TESTING) - I reviewed patient records, labs, notes, testing and imaging myself where available.  Lab Results  Component Value Date   WBC 6.6 03/07/2023   HGB 9.2 (L) 03/07/2023   HCT 27.2 (L) 03/07/2023   MCV 92.8 03/07/2023   PLT 304 03/07/2023      Component Value Date/Time   NA 130 (L) 03/07/2023 1109   NA 143 07/17/2021 1549   K 5.1 03/07/2023 1109   CL 93 (L) 03/07/2023 1109   CO2 25 03/07/2023 1109   GLUCOSE 120 (H) 03/07/2023 1109   BUN 73 (H) 03/07/2023 1109   BUN 51 (H) 07/17/2021 1549   CREATININE 1.95 (H) 03/07/2023 1109   CALCIUM 9.5 03/07/2023 1109   PROT 6.8 03/07/2023 1109   PROT 6.5 07/17/2021 1549   ALBUMIN 3.3 (L) 03/07/2023 1109   ALBUMIN 3.8 07/17/2021 1549   AST 22  03/07/2023 1109   ALT 15 03/07/2023 1109   ALKPHOS 90  03/07/2023 1109   BILITOT 0.6 03/07/2023 1109   BILITOT 0.2 07/17/2021 1549   GFRNONAA 24 (L) 03/07/2023 1109   GFRAA 28 06/25/2020 0000   Lab Results  Component Value Date   CHOL 149 02/12/2023   HDL 33 (L) 02/12/2023   LDLCALC 95 02/12/2023   LDLDIRECT 88 08/07/2022   TRIG 103 02/12/2023   CHOLHDL 4.5 02/12/2023   Lab Results  Component Value Date   HGBA1C 6.6 (H) 02/12/2023   Lab Results  Component Value Date   VITAMINB12 344 02/14/2023   Lab Results  Component Value Date   TSH 2.080 07/19/2020      ASSESSMENT AND PLAN 89 y.o. year old female  has a past medical history of Cerebrovascular disease, unspecified, Coronary atherosclerosis of unspecified type of vessel, native or graft, CVA (cerebral vascular accident) (HCC), Diabetes mellitus without complication (HCC), Other and unspecified hyperlipidemia, Rheumatoid arthritis(714.0), Shingles (2003), and Unspecified essential hypertension. here with:  Acute Ischemic Infarct: 11 mm acute infarct in the right thalamocapsular junction Etiology: small vessel disease    2 mm aneurysm left internal carotid artery Hyperlipidemia Cervical radiculopathy   Continue clopidogrel 75 mg daily   for secondary stroke prevention.   Discussed secondary stroke prevention measures and importance of close PCP follow up for aggressive stroke risk factor management. I have gone over the pathophysiology of stroke, warning signs and symptoms, risk factors and their management in some detail with instructions to go to the closest emergency room for symptoms of concern. HTN: BP goal <130/90.  Blood pressure elevated today.  Advised to keep a check on the blood pressure at home if it remains elevated she should make her PCP aware. HLD: LDL goal <70. Recent LDL 95.  DMII: A1c goal<7.0. Recent A1c 6.4.  Encouraged patient to monitor diet and encouraged exercise Referral placed for home health for PT Will have Dr. Pearlean Brownie review cervical MRI to see  if referral to neurosurgery is warranted. Will also consider repeating angiogram at the next visit to monitor for aneurysm FU with our office 6 months   Butch Penny, MSN, NP-C 03/17/2023, 4:08 PM [facility] [address], [address] [place], [place] [address] [phone]

## 2023-03-18 ENCOUNTER — Telehealth: Payer: Self-pay | Admitting: Adult Health

## 2023-03-18 NOTE — Telephone Encounter (Signed)
Appointment details confirmed

## 2023-03-19 ENCOUNTER — Telehealth: Payer: Self-pay | Admitting: Adult Health

## 2023-03-19 ENCOUNTER — Encounter: Payer: Self-pay | Admitting: Adult Health

## 2023-03-19 ENCOUNTER — Ambulatory Visit (INDEPENDENT_AMBULATORY_CARE_PROVIDER_SITE_OTHER): Payer: Medicare PPO | Admitting: Adult Health

## 2023-03-19 VITALS — BP 172/68 | HR 70 | Ht 59.0 in | Wt 110.0 lb

## 2023-03-19 DIAGNOSIS — M5412 Radiculopathy, cervical region: Secondary | ICD-10-CM

## 2023-03-19 DIAGNOSIS — I63311 Cerebral infarction due to thrombosis of right middle cerebral artery: Secondary | ICD-10-CM

## 2023-03-19 DIAGNOSIS — E785 Hyperlipidemia, unspecified: Secondary | ICD-10-CM

## 2023-03-19 DIAGNOSIS — I671 Cerebral aneurysm, nonruptured: Secondary | ICD-10-CM

## 2023-03-19 NOTE — Telephone Encounter (Signed)
[facility] is taking this patient.

## 2023-03-19 NOTE — Patient Instructions (Addendum)
Your Plan:  Continue Plavix 75 mg daily   Blood pressure goal <130/90 Cholesterol LDL goal <70 Diabetes goal A1c <7 Monitor diet and try to exercise  Referral placed for home PT Will consult with Dr. Pearlean Brownie about your cervical MRI We will consider repeating angiogram at the next visit to monitor  aneurysm If your symptoms worsen or you develop new symptoms please let us know.    Thank you for coming to see [place] at [facility]. I hope we have been able to provide you high quality care today.  You may receive a patient satisfaction survey over the next few weeks. We would appreciate your feedback and comments so that we may continue to improve ourselves and the health of our patients.

## 2023-03-25 ENCOUNTER — Other Ambulatory Visit: Payer: Self-pay | Admitting: Internal Medicine

## 2023-04-02 ENCOUNTER — Other Ambulatory Visit: Payer: Self-pay | Admitting: Internal Medicine

## 2023-04-03 NOTE — Progress Notes (Signed)
I agree with the above plan

## 2023-04-06 ENCOUNTER — Telehealth: Payer: Self-pay | Admitting: Cardiovascular Disease

## 2023-04-06 NOTE — Telephone Encounter (Signed)
   Name: [name]  DOB: 06/07/1927  MRN: [id number]  Primary Cardiologist: Charlton Haws, MD  Chart reviewed as part of pre-operative protocol coverage. Because of [name]'s past medical history and time since last visit, she will require a follow-up in-office visit in order to better assess preoperative cardiovascular risk.  Pre-op covering staff: - Please schedule appointment and call patient to inform them. If patient already had an upcoming appointment within acceptable timeframe, please add "pre-op clearance" to the appointment notes so provider is aware. - Please contact requesting surgeon's office via preferred method (i.e, phone, fax) to inform them of need for appointment prior to surgery.  Patient was recently started on DAPT for 21 days then Plavix monotherapy for recent stroke (December 2024). Recommendations for holding Plavix deferred to neurology given recent stroke.  Carlos Levering, NP  04/06/2023, 4:20 PM

## 2023-04-06 NOTE — Telephone Encounter (Signed)
Called and spoke to patient to schedule in person appt for preop clearance patient stated she needs to speak to her son first and also find transportation before scheduling she will let her son know today and will call our office back to schedule.

## 2023-04-06 NOTE — Telephone Encounter (Signed)
   Pre-operative Risk Assessment    Patient Name: [name]  DOB: 10-11-1927 MRN: [id number]      Request for Surgical Clearance    Procedure:   Rt totally hip replacement  Date of Surgery:  Clearance TBD                                 Surgeon:  Dr. Jodi Geralds Surgeon's Group or Practice Name:  [facility] Phone number:  [phone] Fax number:  [phone]   Type of Clearance Requested:   - Medical  - Pharmacy:  Hold Clopidogrel (Plavix) Def to Cards   Type of Anesthesia:  Spinal   Additional requests/questions:    Nilda Riggs   04/06/2023, 8:55 AM

## 2023-04-07 NOTE — Telephone Encounter (Signed)
S/w the pt and she has been scheduled to see Jari Favre, [facility] 04/16/23 for preop clearance.

## 2023-04-07 NOTE — Telephone Encounter (Signed)
Patient is following up to schedule. Patient states she has an appointment at 1:40 PM this afternoon and if her call isn't returned before about 1:15 PM, please call tomorrow.

## 2023-04-16 ENCOUNTER — Ambulatory Visit: Payer: Medicare PPO | Attending: Physician Assistant | Admitting: Physician Assistant

## 2023-04-16 ENCOUNTER — Encounter: Payer: Self-pay | Admitting: Physician Assistant

## 2023-04-16 VITALS — BP 124/42 | HR 68 | Ht 59.0 in | Wt 117.0 lb

## 2023-04-16 DIAGNOSIS — I10 Essential (primary) hypertension: Secondary | ICD-10-CM | POA: Diagnosis not present

## 2023-04-16 DIAGNOSIS — E782 Mixed hyperlipidemia: Secondary | ICD-10-CM | POA: Diagnosis not present

## 2023-04-16 DIAGNOSIS — Z01810 Encounter for preprocedural cardiovascular examination: Secondary | ICD-10-CM | POA: Diagnosis not present

## 2023-04-16 NOTE — Patient Instructions (Signed)
 Medication Instructions:  Stop Aspirin HOLD Plavix 5-7 days prior to procedure *If you need a refill on your cardiac medications before your next appointment, please call your pharmacy*   Lab Work: FASTING lipids, LFTs prior to f/u in June If you have labs (blood work) drawn today and your tests are completely normal, you will receive your results only by: MyChart Message (if you have MyChart) OR A paper copy in the mail If you have any lab test that is abnormal or we need to change your treatment, we will call you to review the results.   Follow-Up: At [facility], you and your health needs are our priority.  As part of our continuing mission to provide you with exceptional heart care, we have created designated Provider Care Teams.  These Care Teams include your primary Cardiologist (physician) and Advanced Practice Providers (APPs -  Physician Assistants and Nurse Practitioners) who all work together to provide you with the care you need, when you need it.  We recommend signing up for the patient portal called "MyChart".  Sign up information is provided on this After Visit Summary.  MyChart is used to connect with patients for Virtual Visits (Telemedicine).  Patients are able to view lab/test results, encounter notes, upcoming appointments, etc.  Non-urgent messages can be sent to your provider as well.   To learn more about what you can do with MyChart, go to [email or web address].    Your next appointment:   June  Provider:   Eden Emms, MD  Other Instructions   1st Floor: - Lobby - Registration  - Pharmacy  - Lab - Cafe  2nd Floor: - PV Lab - Diagnostic Testing (echo, CT, nuclear med)  3rd Floor: - Vacant  4th Floor: - TCTS (cardiothoracic surgery) - AFib Clinic - Structural Heart Clinic - Vascular Surgery  - Vascular Ultrasound  5th Floor: - [facility] (general and EP) - Clinical Pharmacy for coumadin, hypertension, lipid, weight-loss  medications, and med management appointments    Valet parking services will be available as well.

## 2023-04-16 NOTE — Progress Notes (Unsigned)
 Cardiology Office Note:  .   Date:  04/17/2023  ID:  Corda Murchison Ballinger, DOB 20-Jun-1927, MRN [id number] PCP: Nona Dell, NP  [facility] Providers Cardiologist:  Charlton Haws, MD {   History of Present Illness: .   Veronica Darshay Deupree is a 89 y.o. female who presents for follow-up appointment.  History of CAD status post CABG complicated by CVA.  Last cath 06/2010 with patent grafts, diffuse disease distally of the LAD.  CABG involved SVG to OM/D1 sequential, SVG to PDA, LIMA to LAD.  EF normal at that time.  Last carotid ultrasound was 12/08/2017 with no stenosis.  Diabetes under control.  Living independently.  No longer driving.  No angina at last visit on 12/23.  Son lives next-door and looks in on her.  She has a little terrier mix that provides her with good company.  Had a injection in both hip and knee back in February 2023 for arthritis.  Mostly wheelchair-bound due to the pain.  No angina and no recent hospitalizations at that time.  Primary comorbidities are CRF with most recent creatinine 2.44.  Today, she presents with a significant past medical history of stroke, coronary artery disease, and hypertension, is being evaluated for medical clearance for a hip surgery. The patient had previously been cleared for the same surgery but had decided to postpone it. The patient reports no recent cardiac symptoms, including chest pain or shortness of breath. The patient's medications include Plavix, Carvedilol, and Amlodipine. There is some confusion about whether the patient should also be taking aspirin, with conflicting advice from different healthcare providers. The patient reports no side effects from her current medications. The patient's blood pressure is noted to be slightly low, but she reports no symptoms of lightheadedness or dizziness. The patient's mobility is severely limited due to her hip condition, and she requires assistance with activities of daily  living. The patient has previously tried conservative treatments for her hip condition, including injections, but these have not provided sufficient relief. The patient is aware that the recovery from the surgery may be prolonged and may require a stay in a rehabilitation facility.  Reports no shortness of breath nor dyspnea on exertion. Reports no chest pain, pressure, or tightness. No edema, orthopnea, PND. Reports no palpitations.   Discussed the use of AI scribe software for clinical note transcription with the patient, who gave verbal consent to proceed.   Studies Reviewed: [name]   EKG Interpretation Date/Time:  Thursday April 16 2023 14:09:16 EST Ventricular Rate:  64 PR Interval:  160 QRS Duration:  94 QT Interval:  450 QTC Calculation: 464 R Axis:   11  Text Interpretation: Normal sinus rhythm Nonspecific T wave abnormality When compared with ECG of 07-Mar-2023 11:22, PREVIOUS ECG IS PRESENT Confirmed by Jari Favre [phone]) on 04/16/2023 2:11:31 PM   Echo 02/13/23 IMPRESSIONS     1. Left ventricular ejection fraction, by estimation, is 60 to 65%. The  left ventricle has normal function. The left ventricle has no regional  wall motion abnormalities. Left ventricular diastolic parameters are  consistent with Grade I diastolic  dysfunction (impaired relaxation). Elevated left atrial pressure.   2. Right ventricular systolic function is normal. The right ventricular  size is normal. There is normal pulmonary artery systolic pressure.   3. The mitral valve is normal in structure. Mild mitral valve  regurgitation. No evidence of mitral stenosis. Moderate mitral annular  calcification.   4. The aortic valve is tricuspid. Aortic valve  regurgitation is not  visualized. Aortic valve sclerosis/calcification is present, without any  evidence of aortic stenosis.   5. The inferior vena cava is normal in size with greater than 50%  respiratory variability, suggesting right atrial pressure  of 3 mmHg.   FINDINGS   Left Ventricle: Left ventricular ejection fraction, by estimation, is 60  to 65%. The left ventricle has normal function. The left ventricle has no  regional wall motion abnormalities. The left ventricular internal cavity  size was normal in size. There is   no left ventricular hypertrophy. Left ventricular diastolic parameters  are consistent with Grade I diastolic dysfunction (impaired relaxation).  Elevated left atrial pressure.   Right Ventricle: The right ventricular size is normal. Right ventricular  systolic function is normal. There is normal pulmonary artery systolic  pressure. The tricuspid regurgitant velocity is 2.50 m/s, and with an  assumed right atrial pressure of 3 mmHg,   the estimated right ventricular systolic pressure is 28.0 mmHg.   Left Atrium: Left atrial size was normal in size.   Right Atrium: Right atrial size was normal in size.   Pericardium: There is no evidence of pericardial effusion.   Mitral Valve: The mitral valve is normal in structure. Moderate mitral  annular calcification. Mild mitral valve regurgitation. No evidence of  mitral valve stenosis. MV peak gradient, 9.0 mmHg. The mean mitral valve  gradient is 3.0 mmHg.   Tricuspid Valve: The tricuspid valve is normal in structure. Tricuspid  valve regurgitation is mild . No evidence of tricuspid stenosis.   Aortic Valve: The aortic valve is tricuspid. Aortic valve regurgitation is  not visualized. Aortic valve sclerosis/calcification is present, without  any evidence of aortic stenosis. Aortic valve mean gradient measures 3.0  mmHg. Aortic valve peak gradient  measures 6.1 mmHg. Aortic valve area, by VTI measures 2.48 cm.   Pulmonic Valve: The pulmonic valve was normal in structure. Pulmonic valve  regurgitation is not visualized. No evidence of pulmonic stenosis.   Aorta: The aortic root is normal in size and structure.   Venous: The inferior vena cava is normal in  size with greater than 50%  respiratory variability, suggesting right atrial pressure of 3 mmHg.   IAS/Shunts: No atrial level shunt detected by color flow Doppler.       Physical Exam:   VS:  BP (!) 124/42   Pulse 68   Ht 4\' 11"  (1.499 m)   Wt 117 lb (53.1 kg)   SpO2 96%   BMI 23.63 kg/m    Wt Readings from Last 3 Encounters:  04/16/23 117 lb (53.1 kg)  03/19/23 110 lb (49.9 kg)  03/07/23 119 lb 11.2 oz (54.3 kg)    GEN: Well nourished, well developed in no acute distress NECK: No JVD; No carotid bruits CARDIAC: RRR, no murmurs, rubs, gallops RESPIRATORY:  Clear to auscultation without rales, wheezing or rhonchi  ABDOMEN: Soft, non-tender, non-distended EXTREMITIES:  No edema; No deformity   ASSESSMENT AND PLAN: .   Preop Clearance  Ms. Kloepfer's perioperative risk of a major cardiac event is 11% according to the Revised Cardiac Risk Index (RCRI).  Therefore, she is at high risk for perioperative complications.   Her functional capacity is poor at 3.08 METs according to the Duke Activity Status Index (DASI). Recommendations: The patient is at high risk for perioperative cardiac complications and is at a low functional capacity.  However, further testing will not change how her cardiac status is managed.  Proceed with surgery at  high risk if there are no other options for treatment. D/w Dr. Eden Emms Antiplatelet and/or Anticoagulation Recommendations: Clopidogrel (Plavix) can be held for 5-7 days prior to her surgery and resumed as soon as possible post op.   Hypertension Blood pressure is controlled, but diastolic pressure is slightly low (42). No symptoms of hypotension reported. -Monitor blood pressure, particularly diastolic readings. -Consider reducing amlodipine dose if diastolic pressure remains low or if symptoms of hypotension develop.  Hyperlipidemia Patient was previously on Crestor 10mg , but it was discontinued for unclear reasons. LDL is slightly elevated at  95. -Plan to reassess need for statin therapy postoperatively. -Schedule lipid panel for next visit in June.  Postoperative Plan Anticipate a prolonged recovery period due to patient's age and comorbidities. -Recommend inpatient rehabilitation following surgery. -Schedule follow-up appointment for 3 months postoperatively. -she can resume plavix alone following her surgery     Dispo: She can follow-up in June with Dr. Eden Emms  Signed, Sharlene Dory, PA-C

## 2023-04-24 ENCOUNTER — Other Ambulatory Visit (HOSPITAL_COMMUNITY): Payer: Self-pay | Admitting: *Deleted

## 2023-04-27 ENCOUNTER — Encounter (HOSPITAL_COMMUNITY)
Admission: RE | Admit: 2023-04-27 | Discharge: 2023-04-27 | Disposition: A | Discharge status: Home / Self Care | Source: Ambulatory Visit | Attending: Nephrology | Admitting: Nephrology

## 2023-04-27 DIAGNOSIS — D631 Anemia in chronic kidney disease: Secondary | ICD-10-CM | POA: Insufficient documentation

## 2023-04-27 DIAGNOSIS — N189 Chronic kidney disease, unspecified: Secondary | ICD-10-CM | POA: Diagnosis present

## 2023-04-27 MED ORDER — IRON SUCROSE 200 MG IVPB - SIMPLE MED
200.0000 mg | Status: DC
  Administered 2023-04-27: 200 mg via INTRAVENOUS
  Filled 2023-04-27: qty 200

## 2023-04-28 ENCOUNTER — Telehealth: Payer: Self-pay | Admitting: Physician Assistant

## 2023-04-28 NOTE — Telephone Encounter (Signed)
 Please fax to [phone] ATTN: Darel Hong

## 2023-04-28 NOTE — Telephone Encounter (Signed)
 Faxed over surgical clearance to # below.

## 2023-05-03 ENCOUNTER — Emergency Department (HOSPITAL_COMMUNITY)
Admission: EM | Admit: 2023-05-03 | Discharge: 2023-05-03 | Disposition: A | Discharge status: Home / Self Care | Attending: Student | Admitting: Student

## 2023-05-03 ENCOUNTER — Emergency Department (HOSPITAL_COMMUNITY)

## 2023-05-03 DIAGNOSIS — M25511 Pain in right shoulder: Secondary | ICD-10-CM | POA: Insufficient documentation

## 2023-05-03 DIAGNOSIS — M79641 Pain in right hand: Secondary | ICD-10-CM | POA: Diagnosis present

## 2023-05-03 DIAGNOSIS — Z8673 Personal history of transient ischemic attack (TIA), and cerebral infarction without residual deficits: Secondary | ICD-10-CM | POA: Insufficient documentation

## 2023-05-03 DIAGNOSIS — Z7901 Long term (current) use of anticoagulants: Secondary | ICD-10-CM | POA: Insufficient documentation

## 2023-05-03 DIAGNOSIS — M069 Rheumatoid arthritis, unspecified: Secondary | ICD-10-CM | POA: Insufficient documentation

## 2023-05-03 DIAGNOSIS — M542 Cervicalgia: Secondary | ICD-10-CM | POA: Diagnosis not present

## 2023-05-03 DIAGNOSIS — E1122 Type 2 diabetes mellitus with diabetic chronic kidney disease: Secondary | ICD-10-CM | POA: Insufficient documentation

## 2023-05-03 DIAGNOSIS — N184 Chronic kidney disease, stage 4 (severe): Secondary | ICD-10-CM | POA: Insufficient documentation

## 2023-05-03 DIAGNOSIS — I129 Hypertensive chronic kidney disease with stage 1 through stage 4 chronic kidney disease, or unspecified chronic kidney disease: Secondary | ICD-10-CM | POA: Insufficient documentation

## 2023-05-03 MED ORDER — METHYLPREDNISOLONE 4 MG PO TBPK: ORAL_TABLET | ORAL | 0 refills | Status: DC

## 2023-05-03 MED ORDER — ACETAMINOPHEN 325 MG PO TABS
650.0000 mg | ORAL_TABLET | Freq: Once | ORAL | Status: AC
  Administered 2023-05-03: 650 mg via ORAL
  Filled 2023-05-03: qty 2

## 2023-05-03 NOTE — ED Triage Notes (Addendum)
 X 3-4 pt has noticed swelling to right hand, pt has hx of R.A and thought it was r/t the change in her R.A medication. Pt also c/o pain in right hand , denies any injury

## 2023-05-03 NOTE — ED Provider Notes (Signed)
 [facility] AT [facility] Provider Note   CSN: [id number] Arrival date & time: 05/03/23  0602     History  Chief Complaint  Patient presents with   HAND SWELLING    [name] is a 89 y.o. female.  89 year old female with past medical history significant for rheumatoid arthritis presents today for concern of right hand pain, right shoulder pain and with some associated swelling to the right hand.  She states all of this started yesterday.  She denies any injury.  She also has some neck pain, right shoulder pain.  The history is provided by the patient. No language interpreter was used.       Home Medications Prior to Admission medications   Medication Sig Start Date End Date Taking? Authorizing Provider  Accu-Chek Softclix Lancets lancets Use as instructed to check blood sugars up to 3 times a day  Dx code e11.65 05/17/19   Dorothyann Peng, MD  acetaminophen (TYLENOL) 325 MG tablet Take 2 tablets (650 mg total) by mouth every 6 (six) hours as needed for mild pain (pain score 1-3), moderate pain (pain score 4-6), fever or headache (or temp > 37.5 C (99.5 F)). 02/16/23   Elgergawy, Leana Roe, MD  amLODipine (NORVASC) 10 MG tablet Take 1 tablet (10 mg total) by mouth daily. 02/16/23 02/16/24  Elgergawy, Leana Roe, MD  Blood Glucose Calibration (ACCU-CHEK AVIVA) SOLN Use with accu-check aviva machine 06/02/19   Dorothyann Peng, MD  Blood Glucose Monitoring Suppl (ACCU-CHEK AVIVA PLUS) w/Device KIT Use to check  Blood sugars up to 3 times a day.  Dx code e11.65 12/10/20   Dorothyann Peng, MD  carvedilol (COREG) 6.25 MG tablet Take 1 tablet (6.25 mg total) by mouth 2 (two) times daily. 02/16/23 02/16/24  Elgergawy, Leana Roe, MD  chlorthalidone (HYGROTON) 25 MG tablet Take 12.5 mg by mouth daily. 03/06/23   [provider]  clopidogrel (PLAVIX) 75 MG tablet Take 1 tablet (75 mg total) by mouth daily. 02/17/23   Elgergawy, Leana Roe, MD   fluticasone (FLONASE) 50 MCG/ACT nasal spray Place 2 sprays into both nostrils daily as needed for rhinitis. 06/03/22   [provider]  glucose blood test strip Use as instructed to check blood sugars up to 3 times a day.  Dx code e11.65 05/17/19   Dorothyann Peng, MD  hydroxychloroquine (PLAQUENIL) 200 MG tablet Take 200 mg by mouth daily. 03/31/23   [provider]  JARDIANCE 10 MG TABS tablet Take 10 mg by mouth daily. 03/06/23   [provider]  melatonin 3 MG TABS tablet Take 3 mg by mouth at bedtime. 03/06/23   [provider]  methocarbamol (ROBAXIN) 500 MG tablet Take 500 mg by mouth at bedtime. 03/06/23   [provider]  Multiple Vitamin (MULTIVITAMIN) capsule Take 1 capsule by mouth daily.    [provider]  nitroGLYCERIN (NITROSTAT) 0.4 MG SL tablet PLACE 1 TABLET UNDER THE TONGUE AT THE FIRST SIGN OF ATTACK, MAY REPEAT EVERY 5 MINUTES FOR 3 DOSES IN 15 MINUTES. IF PAIN PERSISTS CALL 911 Patient taking differently: Place 0.4 mg under the tongue every 5 (five) minutes as needed for chest pain. PLACE 1 TABLET UNDER THE TONGUE AT THE FIRST SIGN OF ATTACK, MAY REPEAT EVERY 5 MINUTES FOR 3 DOSES IN 15 MINUTES. IF PAIN PERSISTS CALL 911 08/24/20   Dorothyann Peng, MD  pantoprazole (PROTONIX) 40 MG tablet Take 1 tablet (40 mg total) by mouth daily. 02/17/23  Elgergawy, Leana Roe, MD  polyethylene glycol (MIRALAX / GLYCOLAX) 17 g packet Take 17 g by mouth daily as needed for mild constipation. 08/13/22   Danford, Earl Lites, MD  rosuvastatin (CRESTOR) 10 MG tablet Take 1 tablet (10 mg total) by mouth daily. 02/17/23   Elgergawy, Leana Roe, MD  traMADol (ULTRAM) 50 MG tablet Take 50 mg by mouth 2 (two) times daily. 03/06/23   [provider]      Allergies    Cyclobenzaprine and Tramadol    Review of Systems   Review of Systems  Constitutional:  Negative for fever.  Musculoskeletal:  Positive for arthralgias.  All other systems  reviewed and are negative.   Physical Exam Updated Vital Signs BP (!) 147/56   Pulse 67   Temp 98.1 F (36.7 C)   Resp 14   SpO2 100%  Physical Exam Vitals and nursing note reviewed.  Constitutional:      General: She is not in acute distress.    Appearance: Normal appearance. She is not ill-appearing.  HENT:     Head: Normocephalic and atraumatic.     Nose: Nose normal.  Eyes:     Conjunctiva/sclera: Conjunctivae normal.  Cardiovascular:     Rate and Rhythm: Normal rate.  Pulmonary:     Effort: Pulmonary effort is normal. No respiratory distress.  Musculoskeletal:        General: No deformity. Normal range of motion.     Cervical back: Normal range of motion.     Comments: Good range of motion in the right upper extremity.  Neurovascularly intact.  There is mild tenderness to palpation of the cervical spine.  Swelling noted to the dorsal aspect of the proximal hand.  Appears to be pitting.  Skin:    Findings: No rash.  Neurological:     Mental Status: She is alert.     ED Results / Procedures / Treatments   Labs (all labs ordered are listed, but only abnormal results are displayed) Labs Reviewed - No data to display  EKG None  Radiology CT Cervical Spine Wo Contrast Result Date: 05/03/2023 CLINICAL DATA:  Blunt poly trauma.  History of rheumatoid arthritis EXAM: CT CERVICAL SPINE WITHOUT CONTRAST TECHNIQUE: Multidetector CT imaging of the cervical spine was performed without intravenous contrast. Multiplanar CT image reconstructions were also generated. RADIATION DOSE REDUCTION: This exam was performed according to the departmental dose-optimization program which includes automated exposure control, adjustment of the mA and/or kV according to patient size and/or use of iterative reconstruction technique. COMPARISON:  Cervical MRI 02/12/2023 FINDINGS: Alignment: No traumatic malalignment. Chronic degenerative anterolisthesis at C3-4, C4-5, and C7-T1. Skull base and  vertebrae: Generalized osteopenia. No acute fracture or incidental bone lesion. Soft tissues and spinal canal: No prevertebral fluid or swelling. No visible canal hematoma. Disc levels: Advanced and generalized cervical spine degeneration affecting disc spaces and facets. Ankylosis at C3-4 and C5-6. retro dental ligamentous thickening and calcification the does not appear compressive. Upper chest: Clear apical lungs. IMPRESSION: No acute finding. Advanced and generalized cervical spine degeneration. Electronically Signed   By: Tiburcio Pea M.D.   On: 05/03/2023 07:48   DG Elbow Complete Right Result Date: 05/03/2023 CLINICAL DATA:  Pain and swelling.  History of RA. EXAM: RIGHT ELBOW - COMPLETE 3+ VIEW COMPARISON:  None Available. FINDINGS: The lateral projection radiograph is suboptimal due to patient positioning. There are no signs of acute fracture or dislocation. No focal bone erosions identified. No significant arthropathy. Mild diffuse soft tissue  edema. IMPRESSION: 1. No acute bone abnormality. 2. Mild diffuse soft tissue edema. Electronically Signed   By: Signa Kell M.D.   On: 05/03/2023 07:34   DG Shoulder Right Result Date: 05/03/2023 CLINICAL DATA:  Pain.  History of rheumatoid arthritis. EXAM: RIGHT SHOULDER - 2+ VIEW COMPARISON:  02/09/2013. FINDINGS: No dislocation or acute fracture. Glenohumeral and acromioclavicular joint osteoarthritis. Soft tissues are unremarkable. IMPRESSION: 1. No acute findings. 2. Glenohumeral and acromioclavicular joint osteoarthritis.  The Electronically Signed   By: Signa Kell M.D.   On: 05/03/2023 07:30   DG Hand 2 View Right Result Date: 05/03/2023 CLINICAL DATA:  Hand swelling for 3 days. History of rheumatoid arthritis. EXAM: RIGHT HAND - 2 VIEW COMPARISON:  None Available. FINDINGS: There is diffuse soft tissue edema. Bones appear osteopenic. No signs of acute fracture or dislocation. Slight ulnar deviation of the fingers. There is uniform joint  space narrowing involving the second metacarpal phalangeal joint. Advanced osteoarthritis noted involving the basilar joint. Mild degenerative changes are noted involving the first, second and third DIP joints. Radiocarpal joint space narrowing is noted along with chondrocalcinosis. No focal bone erosions or cystic changes identified. IMPRESSION: 1. Diffuse soft tissue edema. 2. No acute bone abnormality. 3. Osteoarthritis. 4. Chondrocalcinosis. Electronically Signed   By: Signa Kell M.D.   On: 05/03/2023 06:46    Procedures Procedures    Medications Ordered in ED Medications  acetaminophen (TYLENOL) tablet 650 mg (650 mg Oral Given 05/03/23 0753)    ED Course/ Medical Decision Making/ A&P Clinical Course as of 05/03/23 0845  Sun May 03, 2023  0806 DG Elbow Complete Right [AA]    Clinical Course User Index [AA] [name], PA-C                                 Medical Decision Making Amount and/or Complexity of Data Reviewed Radiology: ordered. Decision-making details documented in ED Course.  Risk OTC drugs.   Medical Decision Making / ED Course   This patient presents to the ED for concern of right hand pain, swelling, this involves an extensive number of treatment options, and is a complaint that carries with it a high risk of complications and morbidity.  The differential diagnosis includes flareup of rheumatoid arthritis, fracture, other bony abnormality, cellulitis  MDM: 89 year old female presents today for concern of right hand pain along with some swelling over the dorsal aspect.  Does have good strength on exam.  Tenderness present below the right elbow, right shoulder, and cervical spine.  Imaging obtained of the right hand, right elbow, right shoulder, as well as CT scan of the C-spine.  C-spine shows some degenerative change.  All the other joints have arthritic change otherwise no acute finding.  Discussed with attending who evaluated patient as well.  Feel  this is most consistent with her rheumatoid arthritis.  Will give her Medrol Dosepak and she will follow-up with her rheumatologist.  Patient is in agreement with this plan.  Discharged in stable condition.   Lab Tests: -I ordered, reviewed, and interpreted labs.   The pertinent results include:   Labs Reviewed - No data to display    EKG  EKG Interpretation Date/Time:    Ventricular Rate:    PR Interval:    QRS Duration:    QT Interval:    QTC Calculation:   R Axis:      Text Interpretation:  Imaging Studies ordered: I ordered imaging studies including right shoulder x ray, right elbow x ray, right hand x ray, ct c spine I independently visualized and interpreted imaging. I agree with the radiologist interpretation   Medicines ordered and prescription drug management: Meds ordered this encounter  Medications   acetaminophen (TYLENOL) tablet 650 mg    -I have reviewed the patients home medicines and have made adjustments as needed  Social Determinants of Health:  Factors impacting patients care include: good family support   Reevaluation: After the interventions noted above, I reevaluated the patient and found that they have :improved  Co morbidities that complicate the patient evaluation  Past Medical History:  Diagnosis Date   Cerebrovascular disease, unspecified    Chronic kidney failure, stage 4 (severe) (HCC)    Coronary atherosclerosis of unspecified type of vessel, native or graft    CVA (cerebral vascular accident) (HCC)    Diabetes mellitus without complication (HCC)    Other and unspecified hyperlipidemia    Rheumatoid arthritis(714.0)    Shingles 2003   Unspecified essential hypertension       Dispostion: Discharged in stable condition.  Return precautions discussed.  Patient voices understanding and is in agreement with plan.   Final Clinical Impression(s) / ED Diagnoses Final diagnoses:  Rheumatoid arthritis involving multiple  sites, unspecified whether rheumatoid factor present [facility])    Rx / DC Orders ED Discharge Orders          Ordered    methylPREDNISolone (MEDROL DOSEPAK) 4 MG TBPK tablet        05/03/23 0904              [name], PA-C 05/03/23 6578    Glendora Score, MD 05/03/23 2112

## 2023-05-03 NOTE — Discharge Instructions (Addendum)
 I have sent Medrol Dosepak into the pharmacy for you.  Follow-up with your rheumatologist.  Return for any emergent symptoms.  Take Tylenol as you need to for pain control.

## 2023-05-04 ENCOUNTER — Telehealth: Payer: Self-pay | Admitting: *Deleted

## 2023-05-04 ENCOUNTER — Encounter (HOSPITAL_COMMUNITY)
Admission: RE | Admit: 2023-05-04 | Discharge: 2023-05-04 | Disposition: A | Discharge status: Home / Self Care | Source: Ambulatory Visit | Attending: Nephrology | Admitting: Nephrology

## 2023-05-04 DIAGNOSIS — N189 Chronic kidney disease, unspecified: Secondary | ICD-10-CM | POA: Diagnosis not present

## 2023-05-04 MED ORDER — IRON SUCROSE 200 MG IVPB - SIMPLE MED
200.0000 mg | Status: DC
  Administered 2023-05-04: 200 mg via INTRAVENOUS
  Filled 2023-05-04: qty 200

## 2023-05-04 NOTE — Telephone Encounter (Signed)
 Received clearance form request for pt to have R total hip arthoplasty under spinal anesthesia, date TBD. Dr Pearlean Brownie reviewed and signed clearance form. Pt cleared neurologically with moderate risk, hold plavix 3-5 days before surgery and wait to have surgery until 3 months post 02/12/23 stroke. Form faxed back to [facility] with last office note. Received a receipt of confirmation.  Fax number is [phone]

## 2023-05-07 ENCOUNTER — Other Ambulatory Visit: Payer: Self-pay | Admitting: Orthopedic Surgery

## 2023-05-08 NOTE — Progress Notes (Signed)
Sent message, via epic in basket, requesting order in epic from surgeon  

## 2023-05-09 ENCOUNTER — Encounter (HOSPITAL_BASED_OUTPATIENT_CLINIC_OR_DEPARTMENT_OTHER): Payer: Self-pay | Admitting: Urology

## 2023-05-09 ENCOUNTER — Other Ambulatory Visit: Payer: Self-pay

## 2023-05-09 ENCOUNTER — Emergency Department (HOSPITAL_BASED_OUTPATIENT_CLINIC_OR_DEPARTMENT_OTHER)
Admission: EM | Admit: 2023-05-09 | Discharge: 2023-05-09 | Disposition: A | Discharge status: Home / Self Care | Source: Home / Self Care | Attending: Emergency Medicine | Admitting: Emergency Medicine

## 2023-05-09 DIAGNOSIS — N63 Unspecified lump in unspecified breast: Secondary | ICD-10-CM | POA: Diagnosis not present

## 2023-05-09 DIAGNOSIS — N61 Mastitis without abscess: Secondary | ICD-10-CM | POA: Insufficient documentation

## 2023-05-09 LAB — COMPREHENSIVE METABOLIC PANEL
ALT: 20 U/L (ref 0–44)
AST: 23 U/L (ref 15–41)
Albumin: 3.5 g/dL (ref 3.5–5.0)
Alkaline Phosphatase: 83 U/L (ref 38–126)
Anion gap: 10 (ref 5–15)
BUN: 66 mg/dL — ABNORMAL HIGH (ref 8–23)
CO2: 24 mmol/L (ref 22–32)
Calcium: 8.7 mg/dL — ABNORMAL LOW (ref 8.9–10.3)
Chloride: 104 mmol/L (ref 98–111)
Creatinine, Ser: 1.91 mg/dL — ABNORMAL HIGH (ref 0.44–1.00)
GFR, Estimated: 24 mL/min — ABNORMAL LOW (ref >60)
Glucose, Bld: 218 mg/dL — ABNORMAL HIGH (ref 70–99)
Potassium: 5 mmol/L (ref 3.5–5.1)
Sodium: 138 mmol/L (ref 135–145)
Total Bilirubin: 0.7 mg/dL (ref 0.0–1.2)
Total Protein: 6.7 g/dL (ref 6.5–8.1)

## 2023-05-09 LAB — CBC WITH DIFFERENTIAL/PLATELET
Abs Immature Granulocytes: 0.02 10*3/uL (ref 0.00–0.07)
Basophils Absolute: 0 10*3/uL (ref 0.0–0.1)
Basophils Relative: 0 %
Eosinophils Absolute: 0 10*3/uL (ref 0.0–0.5)
Eosinophils Relative: 0 %
HCT: 28.8 % — ABNORMAL LOW (ref 36.0–46.0)
Hemoglobin: 9.4 g/dL — ABNORMAL LOW (ref 12.0–15.0)
Immature Granulocytes: 0 %
Lymphocytes Relative: 5 %
Lymphs Abs: 0.3 10*3/uL — ABNORMAL LOW (ref 0.7–4.0)
MCH: 31.2 pg (ref 26.0–34.0)
MCHC: 32.6 g/dL (ref 30.0–36.0)
MCV: 95.7 fL (ref 80.0–100.0)
Monocytes Absolute: 0.3 10*3/uL (ref 0.1–1.0)
Monocytes Relative: 5 %
Neutro Abs: 6.5 10*3/uL (ref 1.7–7.7)
Neutrophils Relative %: 90 %
Platelets: 327 10*3/uL (ref 150–400)
RBC: 3.01 MIL/uL — ABNORMAL LOW (ref 3.87–5.11)
RDW: 13.2 % (ref 11.5–15.5)
WBC: 7.2 10*3/uL (ref 4.0–10.5)
nRBC: 0 % (ref 0.0–0.2)

## 2023-05-09 MED ORDER — AMOXICILLIN-POT CLAVULANATE 875-125 MG PO TABS
0.5000 | ORAL_TABLET | Freq: Once | ORAL | Status: AC
  Administered 2023-05-09: 0.5 via ORAL

## 2023-05-09 MED ORDER — AMOXICILLIN-POT CLAVULANATE 500-125 MG PO TABS: 1.0000 | ORAL_TABLET | Freq: Two times a day (BID) | ORAL | 0 refills | Status: DC

## 2023-05-09 MED ORDER — AMOXICILLIN-POT CLAVULANATE 500-125 MG PO TABS: 1.0000 | ORAL_TABLET | Freq: Once | ORAL | Status: DC

## 2023-05-09 NOTE — Discharge Instructions (Addendum)
 Please follow-up with the breast center, as instructed, and internal recommendation has been sent.  Please call them in the a.m. however.  Take antibiotics as prescribed, return to the ER if you have any fevers, chills, worsening rash.

## 2023-05-09 NOTE — ED Provider Notes (Signed)
 [place] [facility] Provider Note   CSN: [id number] Arrival date & time: 05/09/23  1416     History  Chief Complaint  Patient presents with   Breast Problem    [name] is a 89 y.o. female, history of coronary artery disease, who presents to the ED secondary to swollen R breast and nipple. She reports right shoulder, arm pain, this been going on for the last 6 to 7 days, she states she went to the emergency room, last week, and was told everything was fine encouraged to follow-up with PCP.  The imaging last week, just showed arthritic changes, but no acute findings.  She does have rheumatoid arthritis, she was started on a Medrol Dosepak, and encouraged to follow-up with her rheumatologist.  She states since then, the last couple days, her right breast, has been more swollen, and her nipple has tripled in size.  The area under her breast has also become red, and swollen.  She is here primarily because of her breast pain.    Home Medications Prior to Admission medications   Medication Sig Start Date End Date Taking? Authorizing Provider  amoxicillin-clavulanate (AUGMENTIN) 500-125 MG tablet Take 1 tablet by mouth in the morning and at bedtime. 05/09/23  Yes Alaia Lordi L, PA  Accu-Chek Softclix Lancets lancets Use as instructed to check blood sugars up to 3 times a day  Dx code e11.65 05/17/19   Dorothyann Peng, MD  acetaminophen (TYLENOL) 325 MG tablet Take 2 tablets (650 mg total) by mouth every 6 (six) hours as needed for mild pain (pain score 1-3), moderate pain (pain score 4-6), fever or headache (or temp > 37.5 C (99.5 F)). 02/16/23   Elgergawy, Leana Roe, MD  amLODipine (NORVASC) 10 MG tablet Take 1 tablet (10 mg total) by mouth daily. 02/16/23 02/16/24  Elgergawy, Leana Roe, MD  Blood Glucose Calibration (ACCU-CHEK AVIVA) SOLN Use with accu-check aviva machine 06/02/19   Dorothyann Peng, MD  Blood Glucose Monitoring Suppl (ACCU-CHEK  AVIVA PLUS) w/Device KIT Use to check  Blood sugars up to 3 times a day.  Dx code e11.65 12/10/20   Dorothyann Peng, MD  carvedilol (COREG) 6.25 MG tablet Take 1 tablet (6.25 mg total) by mouth 2 (two) times daily. 02/16/23 02/16/24  Elgergawy, Leana Roe, MD  chlorthalidone (HYGROTON) 25 MG tablet Take 12.5 mg by mouth daily. 03/06/23   [provider]  clopidogrel (PLAVIX) 75 MG tablet Take 1 tablet (75 mg total) by mouth daily. 02/17/23   Elgergawy, Leana Roe, MD  fluticasone (FLONASE) 50 MCG/ACT nasal spray Place 2 sprays into both nostrils daily as needed for rhinitis. 06/03/22   [provider]  glucose blood test strip Use as instructed to check blood sugars up to 3 times a day.  Dx code e11.65 05/17/19   Dorothyann Peng, MD  hydroxychloroquine (PLAQUENIL) 200 MG tablet Take 200 mg by mouth daily. 03/31/23   [provider]  JARDIANCE 10 MG TABS tablet Take 10 mg by mouth daily. 03/06/23   [provider]  melatonin 3 MG TABS tablet Take 3 mg by mouth at bedtime. 03/06/23   [provider]  methocarbamol (ROBAXIN) 500 MG tablet Take 500 mg by mouth at bedtime. 03/06/23   [provider]  methylPREDNISolone (MEDROL DOSEPAK) 4 MG TBPK tablet 6-day tapering course:  6, 5, 4, 3, 2, 1 pill(s) for a total of 21 tablets Day 1:  (2) tablets before breakfast, (1) after lunch, (  1) after dinner, (2) at bedtime [24-mg] Day 2:  (1) before breakfast, (1) after lunch, (1) after dinner, (2) at bedtime [20-mg] Day 3:  (1) before breakfast, (1) after lunch, (1) after dinner, (1) at bedtime [16-mg] Day 4:  (1) before breakfast, (1) after lunch, (1) at bedtime [12-mg] Day 5:  (1) before breakfast, (1) at bedtime [8-mg] Day 6:  (1) before breakfast [4-mg] 05/03/23   [name], PA-C  Multiple Vitamin (MULTIVITAMIN) capsule Take 1 capsule by mouth daily.    [provider]  nitroGLYCERIN (NITROSTAT) 0.4 MG SL tablet PLACE 1 TABLET UNDER THE TONGUE AT THE FIRST SIGN  OF ATTACK, MAY REPEAT EVERY 5 MINUTES FOR 3 DOSES IN 15 MINUTES. IF PAIN PERSISTS CALL 911 Patient taking differently: Place 0.4 mg under the tongue every 5 (five) minutes as needed for chest pain. PLACE 1 TABLET UNDER THE TONGUE AT THE FIRST SIGN OF ATTACK, MAY REPEAT EVERY 5 MINUTES FOR 3 DOSES IN 15 MINUTES. IF PAIN PERSISTS CALL 911 08/24/20   Dorothyann Peng, MD  pantoprazole (PROTONIX) 40 MG tablet Take 1 tablet (40 mg total) by mouth daily. 02/17/23   Elgergawy, Leana Roe, MD  polyethylene glycol (MIRALAX / GLYCOLAX) 17 g packet Take 17 g by mouth daily as needed for mild constipation. 08/13/22   Danford, Earl Lites, MD  rosuvastatin (CRESTOR) 10 MG tablet Take 1 tablet (10 mg total) by mouth daily. 02/17/23   Elgergawy, Leana Roe, MD  traMADol (ULTRAM) 50 MG tablet Take 50 mg by mouth 2 (two) times daily. 03/06/23   [provider]      Allergies    Cyclobenzaprine and Tramadol    Review of Systems   Review of Systems  Skin:  Positive for rash.    Physical Exam Updated Vital Signs BP (!) 170/52   Pulse 72   Temp 97.8 F (36.6 C) (Oral)   Resp 16   Ht 4\' 11"  (1.499 m)   Wt 53.1 kg   SpO2 100%   BMI 23.64 kg/m  Physical Exam Vitals and nursing note reviewed.  Constitutional:      General: She is not in acute distress.    Appearance: She is well-developed.  HENT:     Head: Normocephalic and atraumatic.  Eyes:     Conjunctiva/sclera: Conjunctivae normal.  Cardiovascular:     Rate and Rhythm: Normal rate and regular rhythm.     Pulses:          Radial pulses are 2+ on the right side and 2+ on the left side.     Heart sounds: No murmur heard. Pulmonary:     Effort: Pulmonary effort is normal. No respiratory distress.     Breath sounds: Normal breath sounds.  Abdominal:     Palpations: Abdomen is soft.     Tenderness: There is no abdominal tenderness.  Musculoskeletal:        General: No swelling.     Cervical back: Neck supple.     Right lower leg: 3+ Edema  present.     Left lower leg: 3+ Edema present.  Skin:    General: Skin is warm and dry.     Capillary Refill: Capillary refill takes less than 2 seconds.     Comments: Erythema to the right breast predominantly, inferior to the nipple.  Right nipple is 3 times as large, as the left nipple.  Exquisitely tender.  No masses palpated.  Neurological:     Mental Status: She is alert.  Psychiatric:        Mood and Affect: Mood normal.        ED Results / Procedures / Treatments   Labs (all labs ordered are listed, but only abnormal results are displayed) Labs Reviewed  CBC WITH DIFFERENTIAL/PLATELET - Abnormal; Notable for the following components:      Result Value   RBC 3.01 (*)    Hemoglobin 9.4 (*)    HCT 28.8 (*)    Lymphs Abs 0.3 (*)    All other components within normal limits  COMPREHENSIVE METABOLIC PANEL - Abnormal; Notable for the following components:   Glucose, Bld 218 (*)    BUN 66 (*)    Creatinine, Ser 1.91 (*)    Calcium 8.7 (*)    GFR, Estimated 24 (*)    All other components within normal limits    EKG None  Radiology No results found.  Procedures Procedures    Medications Ordered in ED Medications  amoxicillin-clavulanate (AUGMENTIN) 875-125 MG per tablet 0.5 tablet (0.5 tablets Oral Given 05/09/23 1631)    ED Course/ Medical Decision Making/ A&P                                 Medical Decision Making Patient is a 89 year old female, here for right breast pain and swelling has been going on for the last couple days.  Her nipple has tripled in size she states.  It is red, tender to lower breast.  She is overall well-appearing however is afebrile.  I discussed with Dr. Dalene Seltzer, she reviewed the patient, and spoke with the patient.  Concern for possible mastitis.  Will need to follow-up with breast clinic for ultrasound, will start on Augmentin, for further management.  She has a bilateral pitting edema, which is chronic, and she was evaluated, for  her arm pain earlier this past week.  Discharged home  Amount and/or Complexity of Data Reviewed Labs: ordered.  Risk Prescription drug management.   Final Clinical Impression(s) / ED Diagnoses Final diagnoses:  Mastitis    Rx / DC Orders ED Discharge Orders          Ordered    Ambulatory Referral to Breast Specialist        05/09/23 1610    amoxicillin-clavulanate (AUGMENTIN) 500-125 MG tablet  2 times daily        05/09/23 1620              Tomara Youngberg Elbert Ewings, PA 05/09/23 1843    Virgina Norfolk, DO 05/10/23 [phone]

## 2023-05-09 NOTE — ED Triage Notes (Signed)
 Per family pt has swelling to right breast  Also reports some redness  States discomfort and nipple is larger as well per pt

## 2023-05-09 NOTE — ED Notes (Signed)
 Discharge paperwork reviewed entirely with patient, including follow up care. Pain was under control. The patient received instruction and coaching on their prescriptions, and all follow-up questions were answered.  Pt verbalized understanding as well as all parties involved. No questions or concerns voiced at the time of discharge. No acute distress noted. Pt was encouraged to stay adequately hydrated and eat a healthy diet.   Pt was wheeled out to the PVA in a wheelchair without incident.  Pt advised they will seek followup care with a specialist and followup with their PCP.   The pt was instructed to set up and/or review MyChart for their results; and was informed their Providers all have access to the information as well.

## 2023-05-11 ENCOUNTER — Inpatient Hospital Stay (HOSPITAL_COMMUNITY): Admission: RE | Admit: 2023-05-11 | Source: Ambulatory Visit

## 2023-05-12 ENCOUNTER — Emergency Department (HOSPITAL_COMMUNITY)

## 2023-05-12 ENCOUNTER — Emergency Department (HOSPITAL_BASED_OUTPATIENT_CLINIC_OR_DEPARTMENT_OTHER)

## 2023-05-12 ENCOUNTER — Inpatient Hospital Stay (HOSPITAL_COMMUNITY)
Admission: EM | Admit: 2023-05-12 | Discharge: 2023-05-19 | DRG: 600 | Disposition: A | Discharge status: Skilled Nursing Facility | Attending: Family Medicine | Admitting: Family Medicine

## 2023-05-12 DIAGNOSIS — S43004A Unspecified dislocation of right shoulder joint, initial encounter: Principal | ICD-10-CM

## 2023-05-12 DIAGNOSIS — M1611 Unilateral primary osteoarthritis, right hip: Secondary | ICD-10-CM | POA: Diagnosis present

## 2023-05-12 DIAGNOSIS — Z9841 Cataract extraction status, right eye: Secondary | ICD-10-CM

## 2023-05-12 DIAGNOSIS — Z993 Dependence on wheelchair: Secondary | ICD-10-CM

## 2023-05-12 DIAGNOSIS — R2231 Localized swelling, mass and lump, right upper limb: Secondary | ICD-10-CM

## 2023-05-12 DIAGNOSIS — N184 Chronic kidney disease, stage 4 (severe): Secondary | ICD-10-CM | POA: Diagnosis present

## 2023-05-12 DIAGNOSIS — M25561 Pain in right knee: Secondary | ICD-10-CM | POA: Diagnosis present

## 2023-05-12 DIAGNOSIS — I2510 Atherosclerotic heart disease of native coronary artery without angina pectoris: Secondary | ICD-10-CM | POA: Diagnosis present

## 2023-05-12 DIAGNOSIS — M7989 Other specified soft tissue disorders: Secondary | ICD-10-CM

## 2023-05-12 DIAGNOSIS — Z8673 Personal history of transient ischemic attack (TIA), and cerebral infarction without residual deficits: Secondary | ICD-10-CM | POA: Diagnosis not present

## 2023-05-12 DIAGNOSIS — I10 Essential (primary) hypertension: Secondary | ICD-10-CM | POA: Diagnosis present

## 2023-05-12 DIAGNOSIS — M069 Rheumatoid arthritis, unspecified: Secondary | ICD-10-CM | POA: Diagnosis present

## 2023-05-12 DIAGNOSIS — Z951 Presence of aortocoronary bypass graft: Secondary | ICD-10-CM

## 2023-05-12 DIAGNOSIS — I129 Hypertensive chronic kidney disease with stage 1 through stage 4 chronic kidney disease, or unspecified chronic kidney disease: Secondary | ICD-10-CM | POA: Diagnosis present

## 2023-05-12 DIAGNOSIS — Z7902 Long term (current) use of antithrombotics/antiplatelets: Secondary | ICD-10-CM

## 2023-05-12 DIAGNOSIS — Z7984 Long term (current) use of oral hypoglycemic drugs: Secondary | ICD-10-CM

## 2023-05-12 DIAGNOSIS — E119 Type 2 diabetes mellitus without complications: Secondary | ICD-10-CM

## 2023-05-12 DIAGNOSIS — Z888 Allergy status to other drugs, medicaments and biological substances status: Secondary | ICD-10-CM

## 2023-05-12 DIAGNOSIS — Z66 Do not resuscitate: Secondary | ICD-10-CM | POA: Diagnosis present

## 2023-05-12 DIAGNOSIS — N63 Unspecified lump in unspecified breast: Secondary | ICD-10-CM

## 2023-05-12 DIAGNOSIS — N644 Mastodynia: Secondary | ICD-10-CM

## 2023-05-12 DIAGNOSIS — Z751 Person awaiting admission to adequate facility elsewhere: Secondary | ICD-10-CM

## 2023-05-12 DIAGNOSIS — Z8249 Family history of ischemic heart disease and other diseases of the circulatory system: Secondary | ICD-10-CM | POA: Diagnosis not present

## 2023-05-12 DIAGNOSIS — E1151 Type 2 diabetes mellitus with diabetic peripheral angiopathy without gangrene: Secondary | ICD-10-CM | POA: Diagnosis present

## 2023-05-12 DIAGNOSIS — E1122 Type 2 diabetes mellitus with diabetic chronic kidney disease: Secondary | ICD-10-CM | POA: Diagnosis present

## 2023-05-12 DIAGNOSIS — Z885 Allergy status to narcotic agent status: Secondary | ICD-10-CM

## 2023-05-12 DIAGNOSIS — I871 Compression of vein: Secondary | ICD-10-CM | POA: Diagnosis present

## 2023-05-12 DIAGNOSIS — M24411 Recurrent dislocation, right shoulder: Secondary | ICD-10-CM | POA: Diagnosis present

## 2023-05-12 DIAGNOSIS — E1169 Type 2 diabetes mellitus with other specified complication: Secondary | ICD-10-CM

## 2023-05-12 DIAGNOSIS — I739 Peripheral vascular disease, unspecified: Secondary | ICD-10-CM | POA: Diagnosis present

## 2023-05-12 DIAGNOSIS — D638 Anemia in other chronic diseases classified elsewhere: Secondary | ICD-10-CM | POA: Diagnosis present

## 2023-05-12 DIAGNOSIS — N61 Mastitis without abscess: Principal | ICD-10-CM | POA: Diagnosis present

## 2023-05-12 DIAGNOSIS — E785 Hyperlipidemia, unspecified: Secondary | ICD-10-CM | POA: Diagnosis present

## 2023-05-12 DIAGNOSIS — Z7982 Long term (current) use of aspirin: Secondary | ICD-10-CM

## 2023-05-12 DIAGNOSIS — Z9842 Cataract extraction status, left eye: Secondary | ICD-10-CM

## 2023-05-12 DIAGNOSIS — Z79899 Other long term (current) drug therapy: Secondary | ICD-10-CM

## 2023-05-12 LAB — CBC WITH DIFFERENTIAL/PLATELET
Abs Immature Granulocytes: 0.03 10*3/uL (ref 0.00–0.07)
Basophils Absolute: 0 10*3/uL (ref 0.0–0.1)
Basophils Relative: 0 %
Eosinophils Absolute: 0.1 10*3/uL (ref 0.0–0.5)
Eosinophils Relative: 1 %
HCT: 29.8 % — ABNORMAL LOW (ref 36.0–46.0)
Hemoglobin: 9.3 g/dL — ABNORMAL LOW (ref 12.0–15.0)
Immature Granulocytes: 0 %
Lymphocytes Relative: 10 %
Lymphs Abs: 0.8 10*3/uL (ref 0.7–4.0)
MCH: 31.1 pg (ref 26.0–34.0)
MCHC: 31.2 g/dL (ref 30.0–36.0)
MCV: 99.7 fL (ref 80.0–100.0)
Monocytes Absolute: 1 10*3/uL (ref 0.1–1.0)
Monocytes Relative: 13 %
Neutro Abs: 5.8 10*3/uL (ref 1.7–7.7)
Neutrophils Relative %: 76 %
Platelets: 313 10*3/uL (ref 150–400)
RBC: 2.99 MIL/uL — ABNORMAL LOW (ref 3.87–5.11)
RDW: 13.5 % (ref 11.5–15.5)
WBC: 7.7 10*3/uL (ref 4.0–10.5)
nRBC: 0 % (ref 0.0–0.2)

## 2023-05-12 LAB — BASIC METABOLIC PANEL
Anion gap: 12 (ref 5–15)
BUN: 72 mg/dL — ABNORMAL HIGH (ref 8–23)
CO2: 23 mmol/L (ref 22–32)
Calcium: 8.8 mg/dL — ABNORMAL LOW (ref 8.9–10.3)
Chloride: 106 mmol/L (ref 98–111)
Creatinine, Ser: 2.44 mg/dL — ABNORMAL HIGH (ref 0.44–1.00)
GFR, Estimated: 18 mL/min — ABNORMAL LOW (ref >60)
Glucose, Bld: 138 mg/dL — ABNORMAL HIGH (ref 70–99)
Potassium: 4.5 mmol/L (ref 3.5–5.1)
Sodium: 141 mmol/L (ref 135–145)

## 2023-05-12 LAB — CBC
HCT: 25.9 % — ABNORMAL LOW (ref 36.0–46.0)
Hemoglobin: 8.2 g/dL — ABNORMAL LOW (ref 12.0–15.0)
MCH: 31.8 pg (ref 26.0–34.0)
MCHC: 31.7 g/dL (ref 30.0–36.0)
MCV: 100.4 fL — ABNORMAL HIGH (ref 80.0–100.0)
Platelets: 250 10*3/uL (ref 150–400)
RBC: 2.58 MIL/uL — ABNORMAL LOW (ref 3.87–5.11)
RDW: 13.6 % (ref 11.5–15.5)
WBC: 6.7 10*3/uL (ref 4.0–10.5)
nRBC: 0 % (ref 0.0–0.2)

## 2023-05-12 LAB — CREATININE, SERUM
Creatinine, Ser: 2.23 mg/dL — ABNORMAL HIGH (ref 0.44–1.00)
GFR, Estimated: 20 mL/min — ABNORMAL LOW (ref >60)

## 2023-05-12 LAB — CBG MONITORING, ED: Glucose-Capillary: 99 mg/dL (ref 70–99)

## 2023-05-12 MED ORDER — AMLODIPINE BESYLATE 5 MG PO TABS
10.0000 mg | ORAL_TABLET | Freq: Every day | ORAL | Status: DC
Start: 2023-05-13 — End: 2023-05-14
  Administered 2023-05-13 – 2023-05-14 (×2): 10 mg via ORAL
  Filled 2023-05-12: qty 1
  Filled 2023-05-12: qty 2

## 2023-05-12 MED ORDER — LACTATED RINGERS IV SOLN: INTRAVENOUS | Status: AC

## 2023-05-12 MED ORDER — INSULIN ASPART 100 UNIT/ML IJ SOLN
0.0000 [IU] | Freq: Three times a day (TID) | INTRAMUSCULAR | Status: DC
  Administered 2023-05-13: 2 [IU] via SUBCUTANEOUS
  Administered 2023-05-14: 3 [IU] via SUBCUTANEOUS
  Administered 2023-05-14: 2 [IU] via SUBCUTANEOUS
  Administered 2023-05-15 (×2): 3 [IU] via SUBCUTANEOUS
  Administered 2023-05-16 (×2): 5 [IU] via SUBCUTANEOUS
  Administered 2023-05-17: 2 [IU] via SUBCUTANEOUS
  Administered 2023-05-17 – 2023-05-18 (×2): 3 [IU] via SUBCUTANEOUS
  Administered 2023-05-19: 2 [IU] via SUBCUTANEOUS
  Filled 2023-05-12: qty 0.15

## 2023-05-12 MED ORDER — CARVEDILOL 6.25 MG PO TABS
6.2500 mg | ORAL_TABLET | Freq: Two times a day (BID) | ORAL | Status: DC
  Administered 2023-05-12 – 2023-05-19 (×14): 6.25 mg via ORAL
  Filled 2023-05-12 (×8): qty 1
  Filled 2023-05-12 (×2): qty 2
  Filled 2023-05-12 (×4): qty 1

## 2023-05-12 MED ORDER — ONDANSETRON HCL 4 MG/2ML IJ SOLN
4.0000 mg | Freq: Four times a day (QID) | INTRAMUSCULAR | Status: DC | PRN
Start: 2023-05-12 — End: 2023-05-19

## 2023-05-12 MED ORDER — OXYCODONE HCL 5 MG PO TABS
5.0000 mg | ORAL_TABLET | ORAL | Status: AC
  Administered 2023-05-12: 5 mg via ORAL
  Filled 2023-05-12: qty 1

## 2023-05-12 MED ORDER — INSULIN ASPART 100 UNIT/ML IJ SOLN
0.0000 [IU] | Freq: Every day | INTRAMUSCULAR | Status: DC
  Filled 2023-05-12: qty 0.05

## 2023-05-12 MED ORDER — PANTOPRAZOLE SODIUM 40 MG PO TBEC
40.0000 mg | DELAYED_RELEASE_TABLET | Freq: Every day | ORAL | Status: DC
  Administered 2023-05-13 – 2023-05-19 (×7): 40 mg via ORAL
  Filled 2023-05-12 (×7): qty 1

## 2023-05-12 MED ORDER — HEPARIN SODIUM (PORCINE) 5000 UNIT/ML IJ SOLN
5000.0000 [IU] | Freq: Three times a day (TID) | INTRAMUSCULAR | Status: DC
  Administered 2023-05-12 – 2023-05-13 (×2): 5000 [IU] via SUBCUTANEOUS
  Filled 2023-05-12 (×2): qty 1

## 2023-05-12 MED ORDER — ONDANSETRON HCL 4 MG PO TABS: 4.0000 mg | ORAL_TABLET | Freq: Four times a day (QID) | ORAL | Status: DC | PRN

## 2023-05-12 MED ORDER — ROSUVASTATIN CALCIUM 10 MG PO TABS
10.0000 mg | ORAL_TABLET | Freq: Every evening | ORAL | Status: DC
  Administered 2023-05-12 – 2023-05-18 (×7): 10 mg via ORAL
  Filled 2023-05-12 (×7): qty 1

## 2023-05-12 MED ORDER — MORPHINE SULFATE (PF) 2 MG/ML IV SOLN
2.0000 mg | INTRAVENOUS | Status: DC | PRN
  Administered 2023-05-13 – 2023-05-18 (×6): 2 mg via INTRAVENOUS
  Filled 2023-05-12 (×7): qty 1

## 2023-05-12 MED ORDER — TRAMADOL HCL 50 MG PO TABS
50.0000 mg | ORAL_TABLET | Freq: Two times a day (BID) | ORAL | Status: DC | PRN
  Administered 2023-05-13 – 2023-05-19 (×4): 50 mg via ORAL
  Filled 2023-05-12 (×4): qty 1

## 2023-05-12 MED ORDER — CHLORTHALIDONE 25 MG PO TABS
12.5000 mg | ORAL_TABLET | Freq: Every day | ORAL | Status: DC
  Administered 2023-05-13 – 2023-05-14 (×2): 12.5 mg via ORAL
  Filled 2023-05-12 (×2): qty 0.5

## 2023-05-12 MED ORDER — EMPAGLIFLOZIN 10 MG PO TABS
10.0000 mg | ORAL_TABLET | Freq: Every day | ORAL | Status: DC
  Administered 2023-05-13 – 2023-05-14 (×2): 10 mg via ORAL
  Filled 2023-05-12 (×2): qty 1

## 2023-05-12 NOTE — ED Provider Triage Note (Signed)
 Emergency Medicine Provider Triage Evaluation Note  [name] , a 89 y.o. female  was evaluated in triage.  Pt complains of right breast swelling. Right breast is significantly larger than the left, with redness underneath the breast.  Patient also has unilateral swelling of right eye, right upper and lower extremities. Patient was seen at [facility] on 05/09/23, diagnosed with mastitis, and started on augmentin. Breast swelling has increased since prior visit.  Review of Systems  Positive: Right breast enlargement, tenderness Negative: Fever, chills, shortness of breath  Physical Exam  BP (!) 148/60 (BP Location: Left Arm)   Pulse 79   Temp 98.9 F (37.2 C) (Oral)   Resp 18   SpO2 96%  Gen:   Awake, no distress   Resp:  Normal effort  MSK:   Moves extremities without difficulty  Other:  Swollen, tender right breast  Medical Decision Making  Medically screening exam initiated at 1:37 PM.  Appropriate orders placed.  [name] was informed that the remainder of the evaluation will be completed by another provider, this initial triage assessment does not replace that evaluation, and the importance of remaining in the ED until their evaluation is complete.     Shannon Morn, NP 05/12/23 1354

## 2023-05-12 NOTE — ED Provider Notes (Signed)
 [place] EMERGENCY DEPARTMENT AT [facility] Provider Note   CSN: [id number] Arrival date & time: 05/12/23  1302     History  Chief Complaint  Patient presents with   Breast Problem    [name] is a 89 y.o. female.  89 year old female with history of CKD, stroke, and diabetes who presents emergency department right breast swelling.  Patient was seen at [facility] on Saturday and was started on antibiotics for mastitis.  Has been taking her Augmentin since then.  Reports that the swelling has worsened.  Also noticing swelling of her right hand and right leg.  Also has noticed the right side of her face is swollen as well.  No history of cancer.  Not on blood thinners.  Has not yet had an appointment with the breast clinic.  Says that it is becoming more painful.  Was told by an outpatient doctor to come into the emergency department today.  Additional history obtained by daughter-in-law       Home Medications Prior to Admission medications   Medication Sig Start Date End Date Taking? Authorizing Provider  acetaminophen (TYLENOL) 325 MG tablet Take 2 tablets (650 mg total) by mouth every 6 (six) hours as needed for mild pain (pain score 1-3), moderate pain (pain score 4-6), fever or headache (or temp > 37.5 C (99.5 F)). 02/16/23  Yes Elgergawy, Leana Roe, MD  amLODipine (NORVASC) 10 MG tablet Take 1 tablet (10 mg total) by mouth daily. 02/16/23 02/16/24 Yes Elgergawy, Leana Roe, MD  amoxicillin-clavulanate (AUGMENTIN) 500-125 MG tablet Take 1 tablet by mouth in the morning and at bedtime. 05/09/23  Yes Small, Brooke L, PA  aspirin EC 81 MG tablet Take 81 mg by mouth in the morning. Swallow whole.   Yes [provider]  carvedilol (COREG) 6.25 MG tablet Take 1 tablet (6.25 mg total) by mouth 2 (two) times daily. 02/16/23 02/16/24 Yes Elgergawy, Leana Roe, MD  chlorthalidone (HYGROTON) 25 MG tablet Take 12.5 mg by mouth daily. 03/06/23  Yes  [provider]  Cholecalciferol (VITAMIN D3) 50 MCG (2000 UT) TABS Take 2,000 Units by mouth daily.   Yes [provider]  clopidogrel (PLAVIX) 75 MG tablet Take 1 tablet (75 mg total) by mouth daily. 02/17/23  Yes Elgergawy, Leana Roe, MD  Continuous Glucose Sensor (FREESTYLE LIBRE 2 SENSOR) MISC Inject 1 Device into the skin every 14 (fourteen) days.   Yes [provider]  fluticasone (FLONASE) 50 MCG/ACT nasal spray Place 2 sprays into both nostrils See admin instructions. Instill 2 sprays into both nostrils in the morning as needed for allergies or rhinitis 06/03/22  Yes [provider]  JARDIANCE 10 MG TABS tablet Take 10 mg by mouth daily. 03/06/23  Yes [provider]  loratadine (CLARITIN) 10 MG tablet Take 10 mg by mouth daily as needed for allergies or rhinitis.   Yes [provider]  melatonin 3 MG TABS tablet Take 3 mg by mouth at bedtime. 03/06/23  Yes [provider]  nitroGLYCERIN (NITROSTAT) 0.4 MG SL tablet PLACE 1 TABLET UNDER THE TONGUE AT THE FIRST SIGN OF ATTACK, MAY REPEAT EVERY 5 MINUTES FOR 3 DOSES IN 15 MINUTES. IF PAIN PERSISTS CALL 911 Patient taking differently: Place 0.4 mg under the tongue See admin instructions. PLACE 0.4 mg (1 TABLET) UNDER THE TONGUE AT THE FIRST SIGN OF ATTACK, MAY REPEAT EVERY 5 MINUTES FOR 3 DOSES IN 15 MINUTES. IF PAIN PERSISTS, CALL 911. 08/24/20  Yes Dorothyann Peng, MD  NON FORMULARY Take 1 tablet by mouth See admin instructions. Century Mature 0.4 mg-300 mcg-250 mcg tablet- Take 1 tablet by mouth in the morning with breakfast   Yes [provider]  pantoprazole (PROTONIX) 40 MG tablet Take 1 tablet (40 mg total) by mouth daily. Patient taking differently: Take 40 mg by mouth daily as needed. 02/17/23  Yes Elgergawy, Leana Roe, MD  polyethylene glycol (MIRALAX / GLYCOLAX) 17 g packet Take 17 g by mouth daily as needed for mild constipation. 08/13/22  Yes Danford, Earl Lites, MD   rosuvastatin (CRESTOR) 10 MG tablet Take 1 tablet (10 mg total) by mouth daily. Patient taking differently: Take 10 mg by mouth every evening. 02/17/23  Yes Elgergawy, Leana Roe, MD  traMADol (ULTRAM) 50 MG tablet Take 50 mg by mouth 2 (two) times daily as needed (for pain). 03/06/23  Yes [provider]  Accu-Chek Softclix Lancets lancets Use as instructed to check blood sugars up to 3 times a day  Dx code e11.65 05/17/19   Dorothyann Peng, MD  Blood Glucose Calibration (ACCU-CHEK AVIVA) SOLN Use with accu-check aviva machine 06/02/19   Dorothyann Peng, MD  Blood Glucose Monitoring Suppl (ACCU-CHEK AVIVA PLUS) w/Device KIT Use to check  Blood sugars up to 3 times a day.  Dx code e11.65 12/10/20   Dorothyann Peng, MD  glucose blood test strip Use as instructed to check blood sugars up to 3 times a day.  Dx code e11.65 05/17/19   Dorothyann Peng, MD  hydroxychloroquine (PLAQUENIL) 200 MG tablet Take 200 mg by mouth daily. 03/31/23   [provider]  methocarbamol (ROBAXIN) 500 MG tablet Take 500 mg by mouth at bedtime. 03/06/23   [provider]  methylPREDNISolone (MEDROL DOSEPAK) 4 MG TBPK tablet 6-day tapering course:  6, 5, 4, 3, 2, 1 pill(s) for a total of 21 tablets Day 1:  (2) tablets before breakfast, (1) after lunch, (1) after dinner, (2) at bedtime [24-mg] Day 2:  (1) before breakfast, (1) after lunch, (1) after dinner, (2) at bedtime [20-mg] Day 3:  (1) before breakfast, (1) after lunch, (1) after dinner, (1) at bedtime [16-mg] Day 4:  (1) before breakfast, (1) after lunch, (1) at bedtime [12-mg] Day 5:  (1) before breakfast, (1) at bedtime [8-mg] Day 6:  (1) before breakfast [4-mg] Patient not taking: Reported on 05/12/2023 05/03/23   [name], PA-C      Allergies    Cyclobenzaprine and Tramadol    Review of Systems   Review of Systems  Physical Exam Updated Vital Signs BP (!) 159/47   Pulse 72   Temp 98.8 F (37.1 C) (Oral)   Resp 17   SpO2 97%  Physical  Exam Vitals and nursing note reviewed.  Constitutional:      General: She is not in acute distress.    Appearance: She is well-developed.  HENT:     Head: Normocephalic and atraumatic.     Right Ear: External ear normal.     Left Ear: External ear normal.     Nose: Nose normal.  Eyes:     Extraocular Movements: Extraocular movements intact.     Conjunctiva/sclera: Conjunctivae normal.     Pupils: Pupils are equal, round, and reactive to light.  Pulmonary:     Effort: Pulmonary effort is normal. No respiratory distress.  Chest:     Comments: Large amount of swelling and induration of right breast.  No area of fluctuance noted.  Enlarged nipple without any discharge.  See image below.  Does have enlarged and painful axillary lymph nodes Musculoskeletal:     Cervical back: Normal range of motion and neck supple.     Right lower leg: Edema (1+ edema) present.     Left lower leg: No edema.     Comments: Right upper extremity with 1+ edema  Skin:    General: Skin is warm and dry.  Neurological:     Mental Status: She is alert and oriented to person, place, and time. Mental status is at baseline.  Psychiatric:        Mood and Affect: Mood normal.          ED Results / Procedures / Treatments   Labs (all labs ordered are listed, but only abnormal results are displayed) Labs Reviewed  CBC WITH DIFFERENTIAL/PLATELET - Abnormal; Notable for the following components:      Result Value   RBC 2.99 (*)    Hemoglobin 9.3 (*)    HCT 29.8 (*)    All other components within normal limits  BASIC METABOLIC PANEL - Abnormal; Notable for the following components:   Glucose, Bld 138 (*)    BUN 72 (*)    Creatinine, Ser 2.44 (*)    Calcium 8.8 (*)    GFR, Estimated 18 (*)    All other components within normal limits  CBC  CREATININE, SERUM  COMPREHENSIVE METABOLIC PANEL  CBC  CBG MONITORING, ED    EKG None  Radiology CT Shoulder Right Wo Contrast Result Date:  05/12/2023 CLINICAL DATA:  R shoulder dislocation EXAM: CT OF THE UPPER RIGHT EXTREMITY WITHOUT CONTRAST TECHNIQUE: Multidetector CT imaging of the upper right extremity was performed according to the standard protocol. RADIATION DOSE REDUCTION: This exam was performed according to the departmental dose-optimization program which includes automated exposure control, adjustment of the mA and/or kV according to patient size and/or use of iterative reconstruction technique. COMPARISON:  X-ray right shoulder 05/12/2023, CT chest 05/12/2023, x-ray right shoulder 05/03/2023 FINDINGS: Bones/Joint/Cartilage Similar-appearing anterior shoulder dislocation with impaction of the humeral head on the anterior glenoid. Associated joint effusion. Severe degenerative changes of the shoulder. Acute minimally displaced humeral head fracture (2:39). Chronic Hill-Sachs deformity. Benign appearing lucent lesion of the humeral head (6:61). Ligaments Suboptimally assessed by CT. Muscles and Tendons Grossly unremarkable. Soft tissues Subcutaneus soft tissue edema. IMPRESSION: 1. Anterior shoulder dislocation with impaction of the humeral head on the anterior glenoid. Associated joint effusion. 2. Acute minimally displaced humeral head fracture. Electronically Signed   By: Tish Frederickson M.D.   On: 05/12/2023 22:23   DG Shoulder Right Result Date: 05/12/2023 CLINICAL DATA:  Possible shoulder dislocation on CT EXAM: RIGHT SHOULDER - 2+ VIEW COMPARISON:  05/12/2023 radiograph and CT FINDINGS: Mild AC joint degenerative change. Advanced glenohumeral degenerative change. Humeral head appears slightly subluxed on the AP view but there is no anterior or posterior displacement with respect to the glenoid on the Y-view. IMPRESSION: Advanced glenohumeral degenerative change. Suggestion of subluxation on AP view shoulder but not confirmed on Y-view, question joint instability related to history of rheumatoid. There is no fracture identified  Electronically Signed   By: Jasmine Pang M.D.   On: 05/12/2023 21:17   VAS [place] LOWER EXTREMITY VENOUS (DVT) (ONLY [facility] & [facility]) Result Date: 05/12/2023  Lower Venous DVT Study Patient Name:  Yeraldy LENNIS RADER  Date of Exam:   05/12/2023 Medical Rec #: [id number]  Accession #:    [id number] Date of Birth: [birth year]                  Patient Gender: F Patient Age:   58 years Exam Location:  [facility] Procedure:      VAS [place] LOWER EXTREMITY VENOUS (DVT) Referring Phys: Molly Maduro Fitzhugh Vizcarrondo --------------------------------------------------------------------------------  Indications: Swelling.  Limitations: Calcific shadowing, patient unable to position properly. Comparison Study: No previous exams Performing Technologist: Jody Hill RVT, RDMS  Examination Guidelines: A complete evaluation includes B-mode imaging, spectral Doppler, color Doppler, and power Doppler as needed of all accessible portions of each vessel. Bilateral testing is considered an integral part of a complete examination. Limited examinations for reoccurring indications may be performed as noted. The reflux portion of the exam is performed with the patient in reverse Trendelenburg.  +--------+---------------+---------+-----------+----------+--------------------+ RIGHT   CompressibilityPhasicitySpontaneityPropertiesThrombus Aging       +--------+---------------+---------+-----------+----------+--------------------+ CFV     Full           No       Yes                                       +--------+---------------+---------+-----------+----------+--------------------+ SFJ     Full                                                              +--------+---------------+---------+-----------+----------+--------------------+ FV Prox Full           Yes      Yes                                       +--------+---------------+---------+-----------+----------+--------------------+ FV Mid                                                Not well visualized  +--------+---------------+---------+-----------+----------+--------------------+ FV                                                   Not well visualized  Distal                                                                    +--------+---------------+---------+-----------+----------+--------------------+ PFV                    No       Yes                  patent by  color/doppler        +--------+---------------+---------+-----------+----------+--------------------+ POP     Full           Yes      Yes                                       +--------+---------------+---------+-----------+----------+--------------------+ PTV     Full                                                              +--------+---------------+---------+-----------+----------+--------------------+ PERO                                                 Not well visualized  +--------+---------------+---------+-----------+----------+--------------------+ Limited visualization of FV due to extensive calcific shadowing from overlying artery. Patient also unable to lay leg flat, positioning suboptimal. Visualized portions appear patent. PTV & Peroneals not well visualized due to overlying edema.  +----+---------------+---------+-----------+----------+--------------+ LEFTCompressibilityPhasicitySpontaneityPropertiesThrombus Aging +----+---------------+---------+-----------+----------+--------------+ CFV Full           Yes      Yes                                 +----+---------------+---------+-----------+----------+--------------+     Summary: RIGHT: - There is no evidence of deep vein thrombosis in the lower extremity. However, portions of this examination were limited- see technologist comments above.  - No cystic structure found in the popliteal fossa. Extensive  subcutaneous edema of calf and ankle.  LEFT: - No evidence of common femoral vein obstruction.   *See table(s) above for measurements and observations. Electronically signed by Carolynn Sayers on 05/12/2023 at 8:23:49 PM.    Final    UE Venous Duplex ([facility] and [facility] ONLY) Result Date: 05/12/2023 UPPER VENOUS STUDY  Patient Name:  Elim MARVEL SAPP  Date of Exam:   05/12/2023 Medical Rec #: [id number]                  Accession #:    [id number] Date of Birth: 1927-07-24                  Patient Gender: F Patient Age:   58 years Exam Location:  [facility] Procedure:      VAS [place] UPPER EXTREMITY VENOUS DUPLEX Referring Phys: Molly Maduro Jaid Quirion --------------------------------------------------------------------------------  Indications: Pain, and Swelling Risk Factors: Recently diagnosed with mastitis of right breast. Comparison Study: No previous exams Performing Technologist: Jody Hill RVT, RDMS  Examination Guidelines: A complete evaluation includes B-mode imaging, spectral Doppler, color Doppler, and power Doppler as needed of all accessible portions of each vessel. Bilateral testing is considered an integral part of a complete examination. Limited examinations for reoccurring indications may be performed as noted.  Right Findings: +----------+------------+---------+-----------+----------+--------------------+ RIGHT     CompressiblePhasicitySpontaneousProperties      Summary        +----------+------------+---------+-----------+----------+--------------------+ IJV           Full       Yes       Yes                                   +----------+------------+---------+-----------+----------+--------------------+  Subclavian               Yes       Yes                   patent by                                                              color/doppler     +----------+------------+---------+-----------+----------+--------------------+ Axillary      Full       Yes       Yes                                    +----------+------------+---------+-----------+----------+--------------------+ Brachial      Full       Yes       Yes                                   +----------+------------+---------+-----------+----------+--------------------+ Radial        Full                                                       +----------+------------+---------+-----------+----------+--------------------+ Ulnar         Full                                                       +----------+------------+---------+-----------+----------+--------------------+ Cephalic      Full                                                       +----------+------------+---------+-----------+----------+--------------------+ Basilic       Full                                                       +----------+------------+---------+-----------+----------+--------------------+  Left Findings: +----------+------------+---------+-----------+----------+-------+ LEFT      CompressiblePhasicitySpontaneousPropertiesSummary +----------+------------+---------+-----------+----------+-------+ Subclavian    Full       Yes       Yes                      +----------+------------+---------+-----------+----------+-------+  Summary:  Right: No evidence of deep vein thrombosis in the upper extremity. No evidence of superficial vein thrombosis in the upper extremity. Extensive subcutaneous edema of upper extremity.  Left: No evidence of thrombosis in the subclavian.  *See table(s) above for measurements and observations.  Diagnosing physician: Carolynn Sayers Electronically signed by Carolynn Sayers on 05/12/2023 at 8:23:09 PM.    Final    CT  CHEST ABDOMEN PELVIS WO CONTRAST Result Date: 05/12/2023 CLINICAL DATA:  Right-sided swelling to the face, arm, leg, and breast. Right breast swelling began on Thursday. Diagnosed with mastitis on antibiotics. EXAM: CT CHEST, ABDOMEN AND PELVIS WITHOUT CONTRAST  TECHNIQUE: Multidetector CT imaging of the chest, abdomen and pelvis was performed following the standard protocol without IV contrast. RADIATION DOSE REDUCTION: This exam was performed according to the departmental dose-optimization program which includes automated exposure control, adjustment of the mA and/or kV according to patient size and/or use of iterative reconstruction technique. COMPARISON:  Chest radiograph 05/12/2023. CT abdomen and pelvis 03/07/2023 FINDINGS: CT CHEST FINDINGS Cardiovascular: Normal heart size. No pericardial effusions. Calcification in the mitral valve annulus and aorta. Postoperative changes consistent with coronary bypass. Normal caliber thoracic aorta. Mediastinum/Nodes: Thyroid gland is unremarkable. Esophagus is decompressed. No significant lymphadenopathy. Extensive soft tissue infiltration and edema demonstrated throughout the right shoulder, right breast, and right chest wall extending into the abdomen. Right subpectoral collection measuring 3.1 x 7.5 cm, possibly hematoma. Lungs/Pleura: Mild dependent atelectasis versus fibrosis in the lung bases. No airspace disease or consolidation. No pleural effusion or pneumothorax. Musculoskeletal: Anterior dislocation of the right shoulder. Degenerative changes in the right shoulder. Large right shoulder effusion. Degenerative changes in the left shoulder with mild anterior subluxation. No discrete dislocation. Moderate left shoulder effusion. Sternotomy wires are present. Degenerative changes throughout the spine. No acute displaced rib fractures. CT ABDOMEN PELVIS FINDINGS Hepatobiliary: Cholelithiasis with stones layering in the gallbladder. No inflammatory changes. No bile duct dilatation. No focal liver lesions. Pancreas: Unremarkable. No pancreatic ductal dilatation or surrounding inflammatory changes. Spleen: Normal in size without focal abnormality. Adrenals/Urinary Tract: Adrenal glands are unremarkable. Kidneys are normal,  without renal calculi, focal solid lesion, or hydronephrosis. Bladder is unremarkable. Stomach/Bowel: Stomach, small bowel, and colon are not abnormally distended. Stool throughout the colon. No wall thickening or inflammatory changes. Sigmoid colonic diverticulosis without evidence of acute diverticulitis. Appendix is not identified. Vascular/Lymphatic: Aortic atherosclerosis. No enlarged abdominal or pelvic lymph nodes. Reproductive: Uterus and bilateral adnexa are unremarkable. Other: No free air or free fluid in the abdomen. Abdominal wall musculature appears intact. Diffuse soft tissue edema throughout the subcutaneous fat bilaterally. Musculoskeletal: Degenerative changes throughout the lumbar spine. Compression of the inferior endplate of L1 without change since previous study. Slight anterior subluxation of L3 on L4 is also unchanged. Severe degenerative changes in the right hip with bone on bone appearance. Subcortical cysts. Mild femoral head deformity. Less prominent degenerative changes in the left hip. IMPRESSION: 1. Diffuse soft tissue edema involving the right shoulder, right breast, and right chest wall. No mass or lymphadenopathy identified in the mediastinum or supraclavicular regions. 2. Subpectoral hematoma on the right. 3. Anterior dislocation of the right shoulder with large right shoulder effusion. Moderate left shoulder effusion. Degenerative changes in both shoulders in the spine. 4. No evidence of active pulmonary disease. 5. No evidence of active pulmonary disease. 6. Aortic atherosclerosis. 7. Cholelithiasis without evidence of acute cholecystitis. 8. No evidence of bowel obstruction or inflammation. 9. Degenerative changes in the hips. Electronically Signed   By: Burman Nieves M.D.   On: 05/12/2023 20:09   [place] CHEST SOFT TISSUE Result Date: 05/12/2023 CLINICAL DATA:  Right breast abscess EXAM: ULTRASOUND OF THE right BREAST COMPARISON:  None available. FINDINGS: Targeted ultrasound  is performed, showing diffuse soft tissue edema in the visualized right breast. No loculated collection. Normal homogeneous flow signal on color flow Doppler imaging. IMPRESSION: Soft tissue edema in the right  breast. No evidence of loculated abscess. RECOMMENDATION: This examination represents emergent targeted ultrasound of a focal abnormality suggested clinically. Physical examination was not performed. Recommend referral to breast center for more complete workup of any area of clinical concern. BI-RADS CATEGORY  0: Incomplete: Need additional imaging evaluation. Electronically Signed   By: Burman Nieves M.D.   On: 05/12/2023 19:14   DG Shoulder Right Result Date: 05/12/2023 CLINICAL DATA:  Right upper extremity swelling. EXAM: RIGHT SHOULDER - 2+ VIEW COMPARISON:  None Available. FINDINGS: There is no evidence of fracture or dislocation. Severe degenerative changes seen involving the right glenohumeral joint. Moderate degenerative changes seen involving the right acromioclavicular joint. Soft tissues are unremarkable. IMPRESSION: Moderate to severe degenerative changes as noted above. No acute abnormality seen. Electronically Signed   By: Lupita Raider M.D.   On: 05/12/2023 18:51   DG Chest 2 View Result Date: 05/12/2023 CLINICAL DATA:  Right breast swelling. EXAM: CHEST - 2 VIEW COMPARISON:  February 12, 2023. FINDINGS: The heart size and mediastinal contours are within normal limits. Status post coronary artery bypass graft. Both lungs are clear. The visualized skeletal structures are unremarkable. IMPRESSION: No active cardiopulmonary disease. Electronically Signed   By: Lupita Raider M.D.   On: 05/12/2023 18:50    Procedures Procedures    Medications Ordered in ED Medications  amLODipine (NORVASC) tablet 10 mg (has no administration in time range)  carvedilol (COREG) tablet 6.25 mg (6.25 mg Oral Given 05/12/23 2256)  chlorthalidone (HYGROTON) tablet 12.5 mg (has no administration in  time range)  rosuvastatin (CRESTOR) tablet 10 mg (10 mg Oral Given 05/12/23 2256)  empagliflozin (JARDIANCE) tablet 10 mg (has no administration in time range)  pantoprazole (PROTONIX) EC tablet 40 mg (has no administration in time range)  traMADol (ULTRAM) tablet 50 mg (has no administration in time range)  insulin aspart (novoLOG) injection 0-15 Units (has no administration in time range)  insulin aspart (novoLOG) injection 0-5 Units ( Subcutaneous Not Given 05/12/23 2232)  heparin injection 5,000 Units (5,000 Units Subcutaneous Given 05/12/23 2256)  lactated ringers infusion ( Intravenous New Bag/Given 05/12/23 2303)  morphine (PF) 2 MG/ML injection 2 mg (has no administration in time range)  ondansetron (ZOFRAN) tablet 4 mg (has no administration in time range)    Or  ondansetron (ZOFRAN) injection 4 mg (has no administration in time range)  oxyCODONE (Oxy IR/ROXICODONE) immediate release tablet 5 mg (5 mg Oral Given 05/12/23 1901)    ED Course/ Medical Decision Making/ A&P Clinical Course as of 05/12/23 2306  Tue May 12, 2023  1852 Dr Pamelia Hoit consulted. Recommends non-contrast CT and admission.  [RP]  2129 Dr Osie Bond recommends dedicated CT of the shoulder. Patient may need a shoulder replacement. They will see as an inpatient.  [RP]  2152 Dr Mikeal Hawthorne from hospitalist to evaluate for admission. [RP]    Clinical Course User Index [RP] Rondel Baton, MD                                 Medical Decision Making Amount and/or Complexity of Data Reviewed Radiology: ordered.  Risk Prescription drug management. Decision regarding hospitalization.   Ginnie Alyvia Derk is a 89 y.o. female with comorbidities that complicate the patient evaluation including CKD, stroke, and diabetes who presents emergency department right breast swelling.   Initial Ddx:  Mastitis, breast abscess, breast cancer, SVC syndrome, shoulder injury  MDM/Course:  Patient presents to  the emergency  department with breast swelling and pain.  Also complaining of right shoulder pain.  On exam is able to range the shoulder though it is somewhat limited due to pain.  Does also have right upper extremity swelling and right breast swelling.  Some redness of the breast as well.  Does have some mild right-sided facial swelling.  Initially was concerned about SVC syndrome or DVT.  She had a CT scan of the chest, abdomen, and pelvis that showed anterior shoulder dislocation.  Ultrasound without evidence of DVT.  Patient was reevaluated and I am able to abduct her arm though there is some popping and clicking when this occurs.  No trauma to the arm that the patient remembers and it seemed to start hurting over the weekend.  Did discuss with orthopedics because this is very atypical for shoulder dislocation.  It was felt that there may be some other process going on such as a rheumatoid arthritis or even potentially malignancy that could be contributing to her shoulder dislocation.  They will evaluate the patient but felt that she could be in a sling until then.  Also recommended dedicated shoulder CT which was obtained.  If workup for SVC syndrome is negative could potentially be dependent swelling from the shoulder injury.  Discussed with hospitalist and oncology and will be admitted for further evaluation.   This patient presents to the ED for concern of complaints listed in HPI, this involves an extensive number of treatment options, and is a complaint that carries with it a high risk of complications and morbidity. Disposition including potential need for admission considered.   Dispo: Admit to Floor  Additional history obtained from  daughter-in-law Records reviewed Outpatient Clinic Notes The following labs were independently interpreted: Chemistry and show no acute abnormality I independently reviewed the following imaging with scope of interpretation limited to determining acute life threatening  conditions related to emergency care: Extremity x-ray(s) and agree with the radiologist interpretation with the following exceptions: none I personally reviewed and interpreted cardiac monitoring: normal sinus rhythm  I personally reviewed and interpreted the pt's EKG: see above for interpretation  I have reviewed the patients home medications and made adjustments as needed Consults: Hospitalist, Orthopedics, and oncology Social Determinants of health:  Geriatric  Portions of this note were generated with Scientist, clinical (histocompatibility and immunogenetics). Dictation errors may occur despite best attempts at proofreading.     Final Clinical Impression(s) / ED Diagnoses Final diagnoses:  Breast pain  Breast swelling  Dislocation of right shoulder joint, initial encounter  Localized swelling of right upper extremity    Rx / DC Orders ED Discharge Orders     None         Rondel Baton, MD 05/12/23 2306

## 2023-05-12 NOTE — H&P (Signed)
 History and Physical    Patient: [name] XLK:[id number] DOB: 1927/10/02 DOA: 05/12/2023 DOS: the patient was seen and examined on 05/12/2023 PCP: Nona Dell, NP  Patient coming from: Home  Chief Complaint:  Chief Complaint  Patient presents with   Breast Problem   HPI: [name] is a 89 y.o. female with medical history significant of CVA, coronary artery disease, non-insulin-dependent diabetes, chronic kidney disease stage IV, rheumatoid arthritis essential hypertension, who presented to the ER with right breast swelling pain.  Patient was initially seen at an [facility] ER where she was diagnosed with mastitis 4 days ago.  Patient was given antibiotics but the swelling continued and persisted.  She came to the ER today where she was seen and evaluated.  Patient appears to have swelling in the anterior chest the neck of the face on the right side of the body.  Suspicion for SVC syndrome therefore was made.  Further evaluation shows right shoulder dislocation which has now been reduced in the ER.  Dr. Dion Saucier of orthopedics consulted to see patient in the morning.  Patient is still being evaluated for possible SVC syndrome and being admitted to the medical service for further evaluation and treatment.  Patient has no prior history of cancer.  Review of Systems: As mentioned in the history of present illness. All other systems reviewed and are negative. Past Medical History:  Diagnosis Date   Cerebrovascular disease, unspecified    Chronic kidney failure, stage 4 (severe) (HCC)    Coronary atherosclerosis of unspecified type of vessel, native or graft    CVA (cerebral vascular accident) (HCC)    Diabetes mellitus without complication (HCC)    Other and unspecified hyperlipidemia    Rheumatoid arthritis(714.0)    Shingles 2003   Unspecified essential hypertension    Past Surgical History:  Procedure Laterality Date   Cardiac Bypass  2003    CATARACT EXTRACTION, BILATERAL  2007   Social History:  reports that she has never smoked. She has never used smokeless tobacco. She reports that she does not drink alcohol and does not use drugs.  Allergies  Allergen Reactions   Cyclobenzaprine Other (See Comments)    Reaction not recalled by patient   Tramadol Other (See Comments)    Hallucinations     Family History  Problem Relation Age of Onset   Cancer Mother        colon   Hypertension Father    Cancer Brother    Heart disease Brother    Hypertension Paternal Grandmother    Diabetes Neg Hx    Coronary artery disease Neg Hx    Stroke Neg Hx     Prior to Admission medications   Medication Sig Start Date End Date Taking? Authorizing Provider  acetaminophen (TYLENOL) 325 MG tablet Take 2 tablets (650 mg total) by mouth every 6 (six) hours as needed for mild pain (pain score 1-3), moderate pain (pain score 4-6), fever or headache (or temp > 37.5 C (99.5 F)). 02/16/23  Yes Elgergawy, Leana Roe, MD  amLODipine (NORVASC) 10 MG tablet Take 1 tablet (10 mg total) by mouth daily. 02/16/23 02/16/24 Yes Elgergawy, Leana Roe, MD  amoxicillin-clavulanate (AUGMENTIN) 500-125 MG tablet Take 1 tablet by mouth in the morning and at bedtime. 05/09/23  Yes Small, Brooke L, PA  aspirin EC 81 MG tablet Take 81 mg by mouth in the morning. Swallow whole.   Yes [provider]  carvedilol (COREG) 6.25 MG  tablet Take 1 tablet (6.25 mg total) by mouth 2 (two) times daily. 02/16/23 02/16/24 Yes Elgergawy, Leana Roe, MD  chlorthalidone (HYGROTON) 25 MG tablet Take 12.5 mg by mouth daily. 03/06/23  Yes [provider]  Cholecalciferol (VITAMIN D3) 50 MCG (2000 UT) TABS Take 2,000 Units by mouth daily.   Yes [provider]  clopidogrel (PLAVIX) 75 MG tablet Take 1 tablet (75 mg total) by mouth daily. 02/17/23  Yes Elgergawy, Leana Roe, MD  Continuous Glucose Sensor (FREESTYLE LIBRE 2 SENSOR) MISC Inject 1 Device into the skin  every 14 (fourteen) days.   Yes [provider]  fluticasone (FLONASE) 50 MCG/ACT nasal spray Place 2 sprays into both nostrils See admin instructions. Instill 2 sprays into both nostrils in the morning as needed for allergies or rhinitis 06/03/22  Yes [provider]  JARDIANCE 10 MG TABS tablet Take 10 mg by mouth daily. 03/06/23  Yes [provider]  loratadine (CLARITIN) 10 MG tablet Take 10 mg by mouth daily as needed for allergies or rhinitis.   Yes [provider]  melatonin 3 MG TABS tablet Take 3 mg by mouth at bedtime. 03/06/23  Yes [provider]  nitroGLYCERIN (NITROSTAT) 0.4 MG SL tablet PLACE 1 TABLET UNDER THE TONGUE AT THE FIRST SIGN OF ATTACK, MAY REPEAT EVERY 5 MINUTES FOR 3 DOSES IN 15 MINUTES. IF PAIN PERSISTS CALL 911 Patient taking differently: Place 0.4 mg under the tongue See admin instructions. PLACE 0.4 mg (1 TABLET) UNDER THE TONGUE AT THE FIRST SIGN OF ATTACK, MAY REPEAT EVERY 5 MINUTES FOR 3 DOSES IN 15 MINUTES. IF PAIN PERSISTS, CALL 911. 08/24/20  Yes Dorothyann Peng, MD  NON FORMULARY Take 1 tablet by mouth See admin instructions. Century Mature 0.4 mg-300 mcg-250 mcg tablet- Take 1 tablet by mouth in the morning with breakfast   Yes [provider]  pantoprazole (PROTONIX) 40 MG tablet Take 1 tablet (40 mg total) by mouth daily. Patient taking differently: Take 40 mg by mouth daily as needed. 02/17/23  Yes Elgergawy, Leana Roe, MD  polyethylene glycol (MIRALAX / GLYCOLAX) 17 g packet Take 17 g by mouth daily as needed for mild constipation. 08/13/22  Yes Danford, Earl Lites, MD  rosuvastatin (CRESTOR) 10 MG tablet Take 1 tablet (10 mg total) by mouth daily. Patient taking differently: Take 10 mg by mouth every evening. 02/17/23  Yes Elgergawy, Leana Roe, MD  traMADol (ULTRAM) 50 MG tablet Take 50 mg by mouth 2 (two) times daily as needed (for pain). 03/06/23  Yes [provider]  Accu-Chek Softclix Lancets lancets  Use as instructed to check blood sugars up to 3 times a day  Dx code e11.65 05/17/19   Dorothyann Peng, MD  Blood Glucose Calibration (ACCU-CHEK AVIVA) SOLN Use with accu-check aviva machine 06/02/19   Dorothyann Peng, MD  Blood Glucose Monitoring Suppl (ACCU-CHEK AVIVA PLUS) w/Device KIT Use to check  Blood sugars up to 3 times a day.  Dx code e11.65 12/10/20   Dorothyann Peng, MD  glucose blood test strip Use as instructed to check blood sugars up to 3 times a day.  Dx code e11.65 05/17/19   Dorothyann Peng, MD  hydroxychloroquine (PLAQUENIL) 200 MG tablet Take 200 mg by mouth daily. 03/31/23   [provider]  methocarbamol (ROBAXIN) 500 MG tablet Take 500 mg by mouth at bedtime. 03/06/23   [provider]  methylPREDNISolone (MEDROL DOSEPAK) 4 MG TBPK tablet 6-day tapering course:  6, 5, 4, 3, 2,  1 pill(s) for a total of 21 tablets Day 1:  (2) tablets before breakfast, (1) after lunch, (1) after dinner, (2) at bedtime [24-mg] Day 2:  (1) before breakfast, (1) after lunch, (1) after dinner, (2) at bedtime [20-mg] Day 3:  (1) before breakfast, (1) after lunch, (1) after dinner, (1) at bedtime [16-mg] Day 4:  (1) before breakfast, (1) after lunch, (1) at bedtime [12-mg] Day 5:  (1) before breakfast, (1) at bedtime [8-mg] Day 6:  (1) before breakfast [4-mg] Patient not taking: Reported on 05/12/2023 05/03/23   [name], PA-C    Physical Exam: Vitals:   05/12/23 1312 05/12/23 1718 05/12/23 2015 05/12/23 2019  BP: (!) 148/60 (!) 165/61 (!) 159/47   Pulse: 79 80 72   Resp: 18 16 17    Temp: 98.9 F (37.2 C) 98.4 F (36.9 C)  98.8 F (37.1 C)  TempSrc: Oral Oral  Oral  SpO2: 96% 100% 97%    Constitutional: Acutely ill looking, NAD, calm, comfortable Eyes: PERRL, lids and conjunctivae normal ENMT: Mucous membranes are moist. Posterior pharynx clear of any exudate or lesions.Normal dentition.  Neck: Swollen right side of the face extending to the anterior chest wall and right arm   Respiratory: clear to auscultation bilaterally, no wheezing, no crackles. Normal respiratory effort. No accessory muscle use.  Cardiovascular: Regular rate and rhythm, no murmurs / rubs / gallops. No extremity edema. 2+ pedal pulses. No carotid bruits.  Abdomen: no tenderness, no masses palpated. No hepatosplenomegaly. Bowel sounds positive.  Musculoskeletal: Good range of motion, swelling of the right upper extremity or tenderness, Skin: no rashes, lesions, ulcers. No induration Neurologic: CN 2-12 grossly intact. Sensation intact, DTR normal. Strength 5/5 in all 4.  Psychiatric: Normal judgment and insight. Alert and oriented x 3. Normal mood  Data Reviewed:  Temperature 98.8, blood pressure 115/47 hemoglobin 8.2 BUN 72 creatinine 2.44 and calcium 8.8.  White count 6.7 chest x-ray showed no active disease x-ray of the right shoulder showed moderate to severe degenerative changes repeat x-ray showed advanced glenohumeral degenerative change there is subluxation on AP view she due to the right shoulder showed anterior shoulder dislocation with impaction of the humeral head on the anterior glenoid associated with joint effusions CT chest shows diffuse soft tissue edema involving the right shoulder right breast and right chest wall no mass or lymphadenopathy identified in the mediastinal or subclavicular regions there is subpectoral hematoma on the right anterior dissection of the right shoulder with large right shoulder effusion moderate left shoulder effusion no active pulmonary disease no evidence of bowel obstruction or inflammation  Assessment and Plan:  #1 suspected SVC syndrome: I doubt that..  Patient has no significant risk factors.  CT also did not show any lymphadenopathy no masses.  I suspect the swelling is coming from the dislocated right shoulder with subsequent hematoma and fluid collection.  Shoulder has been reduced and orthopedics to see patient in the morning and plan further  treatment  #2 dislocated right shoulder: Patient does not remember trauma.  Most likely came from chronic degenerative joint disease.  #3 coronary artery disease: Status post CABG.  Stable.  No acute decompensation  #4 non-insulin-dependent diabetes: Hold metformin.  Sliding scale insulin  #5 essential hypertension: Blood pressure appears controlled.  Resume home regimen  #6 hyperlipidemia: Will continue with statin  #7 anemia of chronic disease: Monitor H&H especially after hydration  #8 hyperlipidemia: Continue statin  #9 chronic kidney disease stage IV: BUN/creatinine is at baseline  Advance Care Planning:   Code Status: Do not attempt resuscitation (DNR) PRE-ARREST INTERVENTIONS DESIRED   Consults: Dr. Teryl Lucy, orthopedic surgery  Family Communication: No family at bedside  Severity of Illness: The appropriate patient status for this patient is INPATIENT. Inpatient status is judged to be reasonable and necessary in order to provide the required intensity of service to ensure the patient's safety. The patient's presenting symptoms, physical exam findings, and initial radiographic and laboratory data in the context of their chronic comorbidities is felt to place them at high risk for further clinical deterioration. Furthermore, it is not anticipated that the patient will be medically stable for discharge from the hospital within 2 midnights of admission.   * I certify that at the point of admission it is my clinical judgment that the patient will require inpatient hospital care spanning beyond 2 midnights from the point of admission due to high intensity of service, high risk for further deterioration and high frequency of surveillance required.*  AuthorLonia Blood, MD 05/12/2023 10:19 PM  For on call review [email or web address].

## 2023-05-12 NOTE — Progress Notes (Signed)
 Discussed care with EDP.  Nontraumatic shoulder dislocation, I have reviewed the films, there is a strange stippled calcification appearance around the right humeral head, and what looks like anterior subluxation with perching of the humeral head on the anterior glenoid.  All this in the setting of massive unilateral breast and facial swelling, concerning for oncologic etiology.   I am doubtful a closed reduction will have significant clinical benefit, or remain stable after reduction.    Patient may need reverse shoulder replacement.   Full consult to follow, and recommend dedicated CT to define bony deformity and workup soft tissue abnormality.    Eulas Post, MD

## 2023-05-12 NOTE — ED Triage Notes (Signed)
 Pt states that on Thursday she began having R breast swelling. Seen at [facility] on Saturday and diagnosed with mastitis, on abx.

## 2023-05-13 ENCOUNTER — Encounter (HOSPITAL_COMMUNITY): Payer: Self-pay | Admitting: Internal Medicine

## 2023-05-13 ENCOUNTER — Inpatient Hospital Stay (HOSPITAL_COMMUNITY)

## 2023-05-13 ENCOUNTER — Other Ambulatory Visit: Payer: Self-pay

## 2023-05-13 DIAGNOSIS — I871 Compression of vein: Secondary | ICD-10-CM | POA: Diagnosis not present

## 2023-05-13 LAB — BASIC METABOLIC PANEL
Anion gap: 8 (ref 5–15)
BUN: 59 mg/dL — ABNORMAL HIGH (ref 8–23)
CO2: 22 mmol/L (ref 22–32)
Calcium: 8.2 mg/dL — ABNORMAL LOW (ref 8.9–10.3)
Chloride: 108 mmol/L (ref 98–111)
Creatinine, Ser: 2.09 mg/dL — ABNORMAL HIGH (ref 0.44–1.00)
GFR, Estimated: 22 mL/min — ABNORMAL LOW (ref >60)
Glucose, Bld: 149 mg/dL — ABNORMAL HIGH (ref 70–99)
Potassium: 4.1 mmol/L (ref 3.5–5.1)
Sodium: 138 mmol/L (ref 135–145)

## 2023-05-13 LAB — CBC
HCT: 25.8 % — ABNORMAL LOW (ref 36.0–46.0)
HCT: 25.5 % — ABNORMAL LOW (ref 36.0–46.0)
Hemoglobin: 8 g/dL — ABNORMAL LOW (ref 12.0–15.0)
Hemoglobin: 7.9 g/dL — ABNORMAL LOW (ref 12.0–15.0)
MCH: 31.5 pg (ref 26.0–34.0)
MCH: 31.6 pg (ref 26.0–34.0)
MCHC: 31 g/dL (ref 30.0–36.0)
MCHC: 31 g/dL (ref 30.0–36.0)
MCV: 101.6 fL — ABNORMAL HIGH (ref 80.0–100.0)
MCV: 102 fL — ABNORMAL HIGH (ref 80.0–100.0)
Platelets: 262 10*3/uL (ref 150–400)
Platelets: 258 10*3/uL (ref 150–400)
RBC: 2.54 MIL/uL — ABNORMAL LOW (ref 3.87–5.11)
RBC: 2.5 MIL/uL — ABNORMAL LOW (ref 3.87–5.11)
RDW: 13.7 % (ref 11.5–15.5)
RDW: 13.6 % (ref 11.5–15.5)
WBC: 5.7 10*3/uL (ref 4.0–10.5)
WBC: 6.3 10*3/uL (ref 4.0–10.5)
nRBC: 0 % (ref 0.0–0.2)
nRBC: 0 % (ref 0.0–0.2)

## 2023-05-13 LAB — COMPREHENSIVE METABOLIC PANEL
ALT: 13 U/L (ref 0–44)
AST: 19 U/L (ref 15–41)
Albumin: 2.7 g/dL — ABNORMAL LOW (ref 3.5–5.0)
Alkaline Phosphatase: 64 U/L (ref 38–126)
Anion gap: 7 (ref 5–15)
BUN: 58 mg/dL — ABNORMAL HIGH (ref 8–23)
CO2: 25 mmol/L (ref 22–32)
Calcium: 8.4 mg/dL — ABNORMAL LOW (ref 8.9–10.3)
Chloride: 107 mmol/L (ref 98–111)
Creatinine, Ser: 2.21 mg/dL — ABNORMAL HIGH (ref 0.44–1.00)
GFR, Estimated: 20 mL/min — ABNORMAL LOW (ref >60)
Glucose, Bld: 214 mg/dL — ABNORMAL HIGH (ref 70–99)
Potassium: 3.8 mmol/L (ref 3.5–5.1)
Sodium: 139 mmol/L (ref 135–145)
Total Bilirubin: 0.5 mg/dL (ref 0.0–1.2)
Total Protein: 5.5 g/dL — ABNORMAL LOW (ref 6.5–8.1)

## 2023-05-13 LAB — GLUCOSE, CAPILLARY
Glucose-Capillary: 137 mg/dL — ABNORMAL HIGH (ref 70–99)
Glucose-Capillary: 93 mg/dL (ref 70–99)
Glucose-Capillary: 132 mg/dL — ABNORMAL HIGH (ref 70–99)

## 2023-05-13 LAB — CBG MONITORING, ED
Glucose-Capillary: 70 mg/dL (ref 70–99)
Glucose-Capillary: 182 mg/dL — ABNORMAL HIGH (ref 70–99)

## 2023-05-13 MED ORDER — VANCOMYCIN HCL 500 MG/100ML IV SOLN: 500.0000 mg | INTRAVENOUS | Status: DC

## 2023-05-13 MED ORDER — CLOPIDOGREL BISULFATE 75 MG PO TABS
75.0000 mg | ORAL_TABLET | Freq: Every day | ORAL | Status: DC
Start: 2023-05-13 — End: 2023-05-19
  Administered 2023-05-13 – 2023-05-19 (×7): 75 mg via ORAL
  Filled 2023-05-13 (×7): qty 1

## 2023-05-13 MED ORDER — SODIUM CHLORIDE 0.9 % IV SOLN
1.0000 g | INTRAVENOUS | Status: DC
  Administered 2023-05-13: 1 g via INTRAVENOUS
  Filled 2023-05-13: qty 10

## 2023-05-13 MED ORDER — VANCOMYCIN HCL IN DEXTROSE 1-5 GM/200ML-% IV SOLN
1000.0000 mg | Freq: Once | INTRAVENOUS | Status: DC
  Filled 2023-05-13: qty 200

## 2023-05-13 NOTE — Progress Notes (Signed)
 Pharmacy Antibiotic Note  [name] is a 89 y.o. female admitted on 05/12/2023 with mastitis.  Pharmacy has been consulted for vancomycin dosing.  Plan: Ceftriaxone per MD Vancomycin 1000 mg IV x1 then 500 mg IV q48h  (SCr 2.23, est AUC 491) Measure Vanc levels as needed.  Goal AUC = 400 - 550. Follow up renal function, culture results, and clinical course.      Temp (24hrs), Avg:98.7 F (37.1 C), Min:98.4 F (36.9 C), Max:98.9 F (37.2 C)  Recent Labs  Lab 05/09/23 1446 05/12/23 1354 05/12/23 2302  WBC 7.2 7.7 6.7  CREATININE 1.91* 2.44* 2.23*    Estimated Creatinine Clearance: 11.7 mL/min (A) (by C-G formula based on SCr of 2.23 mg/dL (H)).    Allergies  Allergen Reactions   Cyclobenzaprine Other (See Comments)    Reaction not recalled by patient   Tramadol Other (See Comments)    Hallucinations     Antimicrobials this admission: 3/26 Ceftriaxone >>  3/26 Vancomycin >>   Dose adjustments this admission:   Microbiology results:   Thank you for allowing pharmacy to be a part of this patient's care.  Lynann Beaver PharmD, BCPS [facility] main pharmacy [phone] 05/13/2023 10:29 AM

## 2023-05-13 NOTE — Progress Notes (Signed)
 I have reviewed her case, discussed with Dr. Ave Filter, she is a patient of Dr. Luiz Blare (both from [facility]), it appears that she has a subacute anterior subluxation/near dislocation of the glenohumeral joint, this is likely associated with chronic cuff deficiency.  I do not think that closed reduction is going to be of any clinical value, and for definitive management if symptomatic enough she may elect for reverse shoulder arthroplasty.  This can be done on a subacute basis once she has been optimized from all of the rest of the unusual presentation of the soft tissue swelling in her chest wall, and face.  I would not attribute the chest wall/breast swelling (or the face) to the glenohumeral subluxation, I have never seen that clinical presentation.  Dr. Hulda Humphrey is likely going to see her while she is in the hospital, and can make more definitive decision making about surgical timing and intervention options.  Eulas Post, MD

## 2023-05-13 NOTE — Progress Notes (Addendum)
 Progress Note   Patient: [name] XBM:[id number] DOB: 19-Oct-1927 DOA: 05/12/2023     1 DOS: the patient was seen and examined on 05/13/2023   Brief hospital course:  89 y/o F admitted for breast pain and swelling, non-traumatic subpectoral hematoma on CT and diffuse edema without loculated abscess on breast US   Also noted subacute anterior subluxation/near dislocation of the glenohumeral joint for which she is following with Orthopedic Surgery outpatient.   Assessment and Plan: No notes have been filed under this hospital service. Service: Hospitalist   Subpectoral hematoma  ?mastitis  Unclear etiology, patient denies recollection of trauma but she also has a shoulder dislocation  Discussed with GS PA, recommended compression and monitoring. No indication for evacuation at this time.   Not meeting SIRS criteria No loculated abscess seen on breast US, but will empirically treat for mastitis  pharmacy to dose vancomycin and monitor for development of abscess  Very low suspicion for SVC syndrome at this time  - hold asa, ok to continue plavix  - supportive care, warm compress    Acute on Chronic anemia - with concern for subpectoral hematoma, continue to monitor and transfuse as needed.  - d/c heparin  - hold asa, continue plavix.   subacute anterior subluxation/near dislocation of the glenohumeral joint  - may need reverse shoulder arthorplasty for definitive management when medically stable   T2DM - SSI while in house, ada diet   CAD s/p CABG Hx CVA  - cont plavix, hold asa.  Resume statin GDMT resumed   HTN - home medications resumed   SCD No IVF  DNR 2 Heart healthy diet  Monitor/replace electrolytes      Subjective: first encounter with patient.  Patient seen and examined, in NAD. Reports improvement in her right arm swelling. No face swelling noted. Also complaining of new left sided congestion.   Physical Exam: Vitals:   05/13/23 0630  05/13/23 0730 05/13/23 0900 05/13/23 0906  BP: (!) 142/37 (!) 140/38 (!) 137/44   Pulse: 63 64 67   Resp: 13 13 14    Temp:    98.8 F (37.1 C)  TempSrc:    Oral  SpO2: 97% 98% 98%    Physical Exam Vitals reviewed.  HENT:     Nose: Congestion present.     Comments: No facial tenderness   Eyes:     Extraocular Movements: Extraocular movements intact.     Pupils: Pupils are equal, round, and reactive to light.  Neck:     Comments: Some mild b/l cervical lymphadenopathy  Cardiovascular:     Rate and Rhythm: Normal rate and regular rhythm.  Pulmonary:     Effort: Pulmonary effort is normal.     Breath sounds: Normal breath sounds.  Musculoskeletal:     Cervical back: Normal range of motion and neck supple.  Skin:    Comments: Right breast no erythema, but area of induration noted at the 8oclock position. No drainage or overlying skin changes. Overall right breast more full than left.   Neurological:     General: No focal deficit present.     Mental Status: She is alert and oriented to person, place, and time.      Data Reviewed:      Labs on Admission: I have personally reviewed following labs and imaging studies  CBC: Recent Labs  Lab 05/09/23 1446 05/12/23 1354 05/12/23 2302  WBC 7.2 7.7 6.7  NEUTROABS 6.5 5.8  --   HGB 9.4* 9.3*  8.2*  HCT 28.8* 29.8* 25.9*  MCV 95.7 99.7 100.4*  PLT 327 313 250   Basic Metabolic Panel: Recent Labs  Lab 05/09/23 1446 05/12/23 1354 05/12/23 2302  NA 138 141  --   K 5.0 4.5  --   CL 104 106  --   CO2 24 23  --   GLUCOSE 218* 138*  --   BUN 66* 72*  --   CREATININE 1.91* 2.44* 2.23*  CALCIUM 8.7* 8.8*  --    GFR: Estimated Creatinine Clearance: 11.7 mL/min (A) (by C-G formula based on SCr of 2.23 mg/dL (H)). Liver Function Tests: Recent Labs  Lab 05/09/23 1446  AST 23  ALT 20  ALKPHOS 83  BILITOT 0.7  PROT 6.7  ALBUMIN 3.5   No results for input(s): "LIPASE", "AMYLASE" in the last 168 hours. No results for  input(s): "AMMONIA" in the last 168 hours. Coagulation Profile: No results for input(s): "INR", "PROTIME" in the last 168 hours. Cardiac Enzymes: No results for input(s): "CKTOTAL", "CKMB", "CKMBINDEX", "TROPONINI" in the last 168 hours. BNP (last 3 results) No results for input(s): "PROBNP" in the last 8760 hours. HbA1C: No results for input(s): "HGBA1C" in the last 72 hours. CBG: Recent Labs  Lab 05/12/23 2231 05/13/23 0829  GLUCAP 99 70   Lipid Profile: No results for input(s): "CHOL", "HDL", "LDLCALC", "TRIG", "CHOLHDL", "LDLDIRECT" in the last 72 hours. Thyroid Function Tests: No results for input(s): "TSH", "T4TOTAL", "FREET4", "T3FREE", "THYROIDAB" in the last 72 hours. Anemia Panel: No results for input(s): "VITAMINB12", "FOLATE", "FERRITIN", "TIBC", "IRON", "RETICCTPCT" in the last 72 hours. Urine analysis:    Component Value Date/Time   COLORURINE STRAW (A) 03/07/2023 1323   APPEARANCEUR HAZY (A) 03/07/2023 1323   LABSPEC 1.015 03/07/2023 1323   PHURINE 8.5 (H) 03/07/2023 1323   GLUCOSEU NEGATIVE 03/07/2023 1323   HGBUR TRACE (A) 03/07/2023 1323   BILIRUBINUR NEGATIVE 03/07/2023 1323   BILIRUBINUR negative 11/14/2020 1608   KETONESUR NEGATIVE 03/07/2023 1323   PROTEINUR NEGATIVE 03/07/2023 1323   UROBILINOGEN 0.2 11/14/2020 1608   UROBILINOGEN 0.2 04/26/2009 0014   NITRITE NEGATIVE 03/07/2023 1323   LEUKOCYTESUR SMALL (A) 03/07/2023 1323    Radiological Exams on Admission: CT Shoulder Right Wo Contrast Result Date: 05/12/2023 CLINICAL DATA:  R shoulder dislocation EXAM: CT OF THE UPPER RIGHT EXTREMITY WITHOUT CONTRAST TECHNIQUE: Multidetector CT imaging of the upper right extremity was performed according to the standard protocol. RADIATION DOSE REDUCTION: This exam was performed according to the departmental dose-optimization program which includes automated exposure control, adjustment of the mA and/or kV according to patient size and/or use of iterative  reconstruction technique. COMPARISON:  X-ray right shoulder 05/12/2023, CT chest 05/12/2023, x-ray right shoulder 05/03/2023 FINDINGS: Bones/Joint/Cartilage Similar-appearing anterior shoulder dislocation with impaction of the humeral head on the anterior glenoid. Associated joint effusion. Severe degenerative changes of the shoulder. Acute minimally displaced humeral head fracture (2:39). Chronic Hill-Sachs deformity. Benign appearing lucent lesion of the humeral head (6:61). Ligaments Suboptimally assessed by CT. Muscles and Tendons Grossly unremarkable. Soft tissues Subcutaneus soft tissue edema. IMPRESSION: 1. Anterior shoulder dislocation with impaction of the humeral head on the anterior glenoid. Associated joint effusion. 2. Acute minimally displaced humeral head fracture. Electronically Signed   By: Tish Frederickson M.D.   On: 05/12/2023 22:23   DG Shoulder Right Result Date: 05/12/2023 CLINICAL DATA:  Possible shoulder dislocation on CT EXAM: RIGHT SHOULDER - 2+ VIEW COMPARISON:  05/12/2023 radiograph and CT FINDINGS: Mild AC joint degenerative change. Advanced glenohumeral  degenerative change. Humeral head appears slightly subluxed on the AP view but there is no anterior or posterior displacement with respect to the glenoid on the Y-view. IMPRESSION: Advanced glenohumeral degenerative change. Suggestion of subluxation on AP view shoulder but not confirmed on Y-view, question joint instability related to history of rheumatoid. There is no fracture identified Electronically Signed   By: Jasmine Pang M.D.   On: 05/12/2023 21:17   VAS [place] LOWER EXTREMITY VENOUS (DVT) (ONLY [facility] & [facility]) Result Date: 05/12/2023  Lower Venous DVT Study Patient Name:  [name]  Date of Exam:   05/12/2023 Medical Rec #: [id number]                  Accession #:    [id number] Date of Birth: [birth year]                  Patient Gender: F Patient Age:   32 years Exam Location[facility]  [facility] Procedure:      VAS [place]  LOWER EXTREMITY VENOUS (DVT) Referring Phys: Molly Maduro PATERSON --------------------------------------------------------------------------------  Indications: Swelling.  Limitations: Calcific shadowing, patient unable to position properly. Comparison Study: No previous exams Performing Technologist: Jody Hill RVT, RDMS  Examination Guidelines: A complete evaluation includes B-mode imaging, spectral Doppler, color Doppler, and power Doppler as needed of all accessible portions of each vessel. Bilateral testing is considered an integral part of a complete examination. Limited examinations for reoccurring indications may be performed as noted. The reflux portion of the exam is performed with the patient in reverse Trendelenburg.  +--------+---------------+---------+-----------+----------+--------------------+ RIGHT   CompressibilityPhasicitySpontaneityPropertiesThrombus Aging       +--------+---------------+---------+-----------+----------+--------------------+ CFV     Full           No       Yes                                       +--------+---------------+---------+-----------+----------+--------------------+ SFJ     Full                                                              +--------+---------------+---------+-----------+----------+--------------------+ FV Prox Full           Yes      Yes                                       +--------+---------------+---------+-----------+----------+--------------------+ FV Mid                                               Not well visualized  +--------+---------------+---------+-----------+----------+--------------------+ FV                                                   Not well visualized  Distal                                                                    +--------+---------------+---------+-----------+----------+--------------------+  PFV                    No       Yes                  patent by                                                                  color/doppler        +--------+---------------+---------+-----------+----------+--------------------+ POP     Full           Yes      Yes                                       +--------+---------------+---------+-----------+----------+--------------------+ PTV     Full                                                              +--------+---------------+---------+-----------+----------+--------------------+ PERO                                                 Not well visualized  +--------+---------------+---------+-----------+----------+--------------------+ Limited visualization of FV due to extensive calcific shadowing from overlying artery. Patient also unable to lay leg flat, positioning suboptimal. Visualized portions appear patent. PTV & Peroneals not well visualized due to overlying edema.  +----+---------------+---------+-----------+----------+--------------+ LEFTCompressibilityPhasicitySpontaneityPropertiesThrombus Aging +----+---------------+---------+-----------+----------+--------------+ CFV Full           Yes      Yes                                 +----+---------------+---------+-----------+----------+--------------+     Summary: RIGHT: - There is no evidence of deep vein thrombosis in the lower extremity. However, portions of this examination were limited- see technologist comments above.  - No cystic structure found in the popliteal fossa. Extensive subcutaneous edema of calf and ankle.  LEFT: - No evidence of common femoral vein obstruction.   *See table(s) above for measurements and observations. Electronically signed by Carolynn Sayers on 05/12/2023 at 8:23:49 PM.    Final    UE Venous Duplex ([facility] and [facility] ONLY) Result Date: 05/12/2023 UPPER VENOUS STUDY  Patient Name:  [name]  Date of Exam:   05/12/2023 Medical Rec #: [id number]                  Accession #:    [id number] Date of Birth:  May 12, 1927                  Patient Gender: F Patient Age:   33 years Exam Location:  [facility] Procedure:      VAS [place] UPPER EXTREMITY VENOUS DUPLEX Referring Phys: Molly Maduro PATERSON --------------------------------------------------------------------------------  Indications: Pain, and Swelling Risk Factors: Recently diagnosed with mastitis of  right breast. Comparison Study: No previous exams Performing Technologist: Jody Hill RVT, RDMS  Examination Guidelines: A complete evaluation includes B-mode imaging, spectral Doppler, color Doppler, and power Doppler as needed of all accessible portions of each vessel. Bilateral testing is considered an integral part of a complete examination. Limited examinations for reoccurring indications may be performed as noted.  Right Findings: +----------+------------+---------+-----------+----------+--------------------+ RIGHT     CompressiblePhasicitySpontaneousProperties      Summary        +----------+------------+---------+-----------+----------+--------------------+ IJV           Full       Yes       Yes                                   +----------+------------+---------+-----------+----------+--------------------+ Subclavian               Yes       Yes                   patent by                                                              color/doppler     +----------+------------+---------+-----------+----------+--------------------+ Axillary      Full       Yes       Yes                                   +----------+------------+---------+-----------+----------+--------------------+ Brachial      Full       Yes       Yes                                   +----------+------------+---------+-----------+----------+--------------------+ Radial        Full                                                       +----------+------------+---------+-----------+----------+--------------------+ Ulnar         Full                                                        +----------+------------+---------+-----------+----------+--------------------+ Cephalic      Full                                                       +----------+------------+---------+-----------+----------+--------------------+ Basilic       Full                                                       +----------+------------+---------+-----------+----------+--------------------+  Left Findings: +----------+------------+---------+-----------+----------+-------+ LEFT      CompressiblePhasicitySpontaneousPropertiesSummary +----------+------------+---------+-----------+----------+-------+ Subclavian    Full       Yes       Yes                      +----------+------------+---------+-----------+----------+-------+  Summary:  Right: No evidence of deep vein thrombosis in the upper extremity. No evidence of superficial vein thrombosis in the upper extremity. Extensive subcutaneous edema of upper extremity.  Left: No evidence of thrombosis in the subclavian.  *See table(s) above for measurements and observations.  Diagnosing physician: Carolynn Sayers Electronically signed by Carolynn Sayers on 05/12/2023 at 8:23:09 PM.    Final    CT CHEST ABDOMEN PELVIS WO CONTRAST Result Date: 05/12/2023 CLINICAL DATA:  Right-sided swelling to the face, arm, leg, and breast. Right breast swelling began on Thursday. Diagnosed with mastitis on antibiotics. EXAM: CT CHEST, ABDOMEN AND PELVIS WITHOUT CONTRAST TECHNIQUE: Multidetector CT imaging of the chest, abdomen and pelvis was performed following the standard protocol without IV contrast. RADIATION DOSE REDUCTION: This exam was performed according to the departmental dose-optimization program which includes automated exposure control, adjustment of the mA and/or kV according to patient size and/or use of iterative reconstruction technique. COMPARISON:  Chest radiograph 05/12/2023. CT abdomen and pelvis 03/07/2023  FINDINGS: CT CHEST FINDINGS Cardiovascular: Normal heart size. No pericardial effusions. Calcification in the mitral valve annulus and aorta. Postoperative changes consistent with coronary bypass. Normal caliber thoracic aorta. Mediastinum/Nodes: Thyroid gland is unremarkable. Esophagus is decompressed. No significant lymphadenopathy. Extensive soft tissue infiltration and edema demonstrated throughout the right shoulder, right breast, and right chest wall extending into the abdomen. Right subpectoral collection measuring 3.1 x 7.5 cm, possibly hematoma. Lungs/Pleura: Mild dependent atelectasis versus fibrosis in the lung bases. No airspace disease or consolidation. No pleural effusion or pneumothorax. Musculoskeletal: Anterior dislocation of the right shoulder. Degenerative changes in the right shoulder. Large right shoulder effusion. Degenerative changes in the left shoulder with mild anterior subluxation. No discrete dislocation. Moderate left shoulder effusion. Sternotomy wires are present. Degenerative changes throughout the spine. No acute displaced rib fractures. CT ABDOMEN PELVIS FINDINGS Hepatobiliary: Cholelithiasis with stones layering in the gallbladder. No inflammatory changes. No bile duct dilatation. No focal liver lesions. Pancreas: Unremarkable. No pancreatic ductal dilatation or surrounding inflammatory changes. Spleen: Normal in size without focal abnormality. Adrenals/Urinary Tract: Adrenal glands are unremarkable. Kidneys are normal, without renal calculi, focal solid lesion, or hydronephrosis. Bladder is unremarkable. Stomach/Bowel: Stomach, small bowel, and colon are not abnormally distended. Stool throughout the colon. No wall thickening or inflammatory changes. Sigmoid colonic diverticulosis without evidence of acute diverticulitis. Appendix is not identified. Vascular/Lymphatic: Aortic atherosclerosis. No enlarged abdominal or pelvic lymph nodes. Reproductive: Uterus and bilateral adnexa  are unremarkable. Other: No free air or free fluid in the abdomen. Abdominal wall musculature appears intact. Diffuse soft tissue edema throughout the subcutaneous fat bilaterally. Musculoskeletal: Degenerative changes throughout the lumbar spine. Compression of the inferior endplate of L1 without change since previous study. Slight anterior subluxation of L3 on L4 is also unchanged. Severe degenerative changes in the right hip with bone on bone appearance. Subcortical cysts. Mild femoral head deformity. Less prominent degenerative changes in the left hip. IMPRESSION: 1. Diffuse soft tissue edema involving the right shoulder, right breast, and right chest wall. No mass or lymphadenopathy identified in the mediastinum or supraclavicular regions. 2. Subpectoral hematoma on the right. 3. Anterior dislocation of the right shoulder with large right shoulder effusion. Moderate left  shoulder effusion. Degenerative changes in both shoulders in the spine. 4. No evidence of active pulmonary disease. 5. No evidence of active pulmonary disease. 6. Aortic atherosclerosis. 7. Cholelithiasis without evidence of acute cholecystitis. 8. No evidence of bowel obstruction or inflammation. 9. Degenerative changes in the hips. Electronically Signed   By: Burman Nieves M.D.   On: 05/12/2023 20:09   [place] CHEST SOFT TISSUE Result Date: 05/12/2023 CLINICAL DATA:  Right breast abscess EXAM: ULTRASOUND OF THE right BREAST COMPARISON:  None available. FINDINGS: Targeted ultrasound is performed, showing diffuse soft tissue edema in the visualized right breast. No loculated collection. Normal homogeneous flow signal on color flow Doppler imaging. IMPRESSION: Soft tissue edema in the right breast. No evidence of loculated abscess. RECOMMENDATION: This examination represents emergent targeted ultrasound of a focal abnormality suggested clinically. Physical examination was not performed. Recommend referral to breast center for more complete  workup of any area of clinical concern. BI-RADS CATEGORY  0: Incomplete: Need additional imaging evaluation. Electronically Signed   By: Burman Nieves M.D.   On: 05/12/2023 19:14   DG Shoulder Right Result Date: 05/12/2023 CLINICAL DATA:  Right upper extremity swelling. EXAM: RIGHT SHOULDER - 2+ VIEW COMPARISON:  None Available. FINDINGS: There is no evidence of fracture or dislocation. Severe degenerative changes seen involving the right glenohumeral joint. Moderate degenerative changes seen involving the right acromioclavicular joint. Soft tissues are unremarkable. IMPRESSION: Moderate to severe degenerative changes as noted above. No acute abnormality seen. Electronically Signed   By: Lupita Raider M.D.   On: 05/12/2023 18:51   DG Chest 2 View Result Date: 05/12/2023 CLINICAL DATA:  Right breast swelling. EXAM: CHEST - 2 VIEW COMPARISON:  February 12, 2023. FINDINGS: The heart size and mediastinal contours are within normal limits. Status post coronary artery bypass graft. Both lungs are clear. The visualized skeletal structures are unremarkable. IMPRESSION: No active cardiopulmonary disease. Electronically Signed   By: Lupita Raider M.D.   On: 05/12/2023 18:50       Time spent: 35 minutes  Author: Loraine Leriche, DO 05/13/2023 10:16 AM  For on call review [email or web address].

## 2023-05-13 NOTE — Progress Notes (Signed)
 89 year old female with extensive medical history with right shoulder pain with x-ray and CT with subluxation in the setting of severe rotator cuff arthropathy.  She denies any trauma or fall but noticed the shoulder pain sometime last week.  She is being admitted for possible mastitis. CT also demonstrates a fairly large subpectoral hematoma though she denies any trauma.  I do not believe her shoulder is related to her anterior chest wall neck and face swelling and her subluxation is likely due to her chronic degenerative changes.  There is no acute surgical intervention at this time.  The patient can follow-up on an outpatient basis in the office with Dr. Luiz Blare for her left hip with Dr. Ave Filter for her shoulder if she desires further treatment

## 2023-05-13 NOTE — Consult Note (Signed)
 ORTHOPAEDIC CONSULTATION  REQUESTING PHYSICIAN: Krugh, Marissa C, DO  Chief Complaint: Right shoulder pain  HPI: [name] is a 89 y.o. female with history of CVA, CAD, diabetes, CKD stage IV, rheumatoid arthritis, HTN who complains of right shoulder pain.  She was recently evaluated in the emergency department on 05/09/2023 for right breast swelling and pain.  She was diagnosed with mastitis and told to follow-up as outpatient.  She is feels like her right shoulder has been hurting ever since then.  She denies any trauma to the shoulder.  She has limited range of motion and feels like she feels a crunching with movement.  Pain pain can be severe with extensive movement, better with rest.  X-rays were taken showing a shoulder dislocation and orthopedics was consulted.  She also complains of right knee pain today.  Of note according to her chart she did have a total hip replacement scheduled with Dr. Luiz Blare on 05/20/2023 she states this has been canceled due to her right breast swelling.  Denies any numbness or tingling in the right arm or hand.  She states the swelling of her right hand is normal as her rheumatoid arthritis affects multiple joints.  Denies cancer history or use of blood thinners.  She states due to her hip pain she has been wheelchair-bound for the last few weeks to months.  Past Medical History:  Diagnosis Date   Cerebrovascular disease, unspecified    Chronic kidney failure, stage 4 (severe) (HCC)    Coronary atherosclerosis of unspecified type of vessel, native or graft    CVA (cerebral vascular accident) (HCC)    Diabetes mellitus without complication (HCC)    Other and unspecified hyperlipidemia    Rheumatoid arthritis(714.0)    Shingles 2003   Unspecified essential hypertension    Past Surgical History:  Procedure Laterality Date   Cardiac Bypass  2003   CATARACT EXTRACTION, BILATERAL  2007   Social History   Socioeconomic History   Marital status:  Widowed    Spouse name: Not on file   Number of children: Not on file   Years of education: Not on file   Highest education level: Not on file  Occupational History   Occupation: retired  Tobacco Use   Smoking status: Never   Smokeless tobacco: Never  Vaping Use   Vaping status: Never Used  Substance and Sexual Activity   Alcohol use: Never   Drug use: Never   Sexual activity: Not Currently  Other Topics Concern   Not on file  Social History Narrative   RetiredTobacco use- noAlcohol use-no      Lives alone, son lives next door    Right handed   Caffeine: 1 cup/day   Social Drivers of Corporate investment banker Strain: Low Risk  (03/27/2022)   Overall Financial Resource Strain (CARDIA)    Difficulty of Paying Living Expenses: Not hard at all  Food Insecurity: No Food Insecurity (02/13/2023)   Hunger Vital Sign    Worried About Running Out of Food in the Last Year: Never true    Ran Out of Food in the Last Year: Never true  Transportation Needs: No Transportation Needs (02/13/2023)   PRAPARE - Administrator, Civil Service (Medical): No    Lack of Transportation (Non-Medical): No  Physical Activity: Inactive (03/27/2022)   Exercise Vital Sign    Days of Exercise per Week: 0 days    Minutes of Exercise per Session: 0 min  Stress:  No Stress Concern Present (03/27/2022)   [employer] of Occupational Health - Occupational Stress Questionnaire    Feeling of Stress : Not at all  Social Connections: Moderately Integrated (11/14/2020)   Social Connection and Isolation Panel [NHANES]    Frequency of Communication with Friends and Family: More than three times a week    Frequency of Social Gatherings with Friends and Family: Once a week    Attends Religious Services: More than 4 times per year    Active Member of [employer] or Organizations: Yes    Attends Banker Meetings: Never    Marital Status: Widowed   Family History  Problem Relation Age of Onset    Cancer Mother        colon   Hypertension Father    Cancer Brother    Heart disease Brother    Hypertension Paternal Grandmother    Diabetes Neg Hx    Coronary artery disease Neg Hx    Stroke Neg Hx    Allergies  Allergen Reactions   Cyclobenzaprine Other (See Comments)    Reaction not recalled by patient   Tramadol Other (See Comments)    Hallucinations      Positive ROS: All other systems have been reviewed and were otherwise negative with the exception of those mentioned in the HPI and as above.  Physical Exam: General: Alert, no acute distress Cardiovascular: No pedal edema Respiratory: No cyanosis, no use of accessory musculature GI: No organomegaly, abdomen is soft and non-tender Skin: Significant edema of the right breast compared to the left with significant nipple edema as well.  Skin of the right breast is shiny in quality, no ecchymosis. Neurologic: Sensation intact distally Psychiatric: Patient is competent for consent with normal mood and affect Lymphatic: No axillary or cervical lymphadenopathy  MUSCULOSKELETAL: Noted deformity to the right shoulder.  She is able to pull herself up while laying in her stretcher without indicating severe pain.  0 to 60 degrees active forward flexion at the right shoulder.  All fingers flex extend abduct, significant swelling to multiple joints of the right hand.  Sensation intact distally.  2+ radial pulse.  Imaging: CT of the right shoulder shows anterior shoulder dislocation with impaction of humeral head on the anterior glenoid, acute minimally displaced humeral head fracture  X-rays of right knee show moderate tricompartmental degenerative disease.  Assessment: Right glenohumeral subluxation Right knee pain Rheumatoid arthritis Right breast/nipple swelling  Plan: Please see Dr. Shelba Flake follow-up note regarding plan.     Armida Sans, PA-C   05/13/2023 11:42 AM

## 2023-05-13 NOTE — ED Notes (Signed)
 Assumed care of patient. Patient currently in bed with no signs of acute distress noted. Waiting on ready hospital bed. Patient voices no concerns at this time.

## 2023-05-14 DIAGNOSIS — I871 Compression of vein: Secondary | ICD-10-CM | POA: Diagnosis not present

## 2023-05-14 LAB — CBC
HCT: 25.2 % — ABNORMAL LOW (ref 36.0–46.0)
Hemoglobin: 7.9 g/dL — ABNORMAL LOW (ref 12.0–15.0)
MCH: 32 pg (ref 26.0–34.0)
MCHC: 31.3 g/dL (ref 30.0–36.0)
MCV: 102 fL — ABNORMAL HIGH (ref 80.0–100.0)
Platelets: 231 10*3/uL (ref 150–400)
RBC: 2.47 MIL/uL — ABNORMAL LOW (ref 3.87–5.11)
RDW: 13.4 % (ref 11.5–15.5)
WBC: 5.9 10*3/uL (ref 4.0–10.5)
nRBC: 0 % (ref 0.0–0.2)

## 2023-05-14 LAB — GLUCOSE, CAPILLARY
Glucose-Capillary: 89 mg/dL (ref 70–99)
Glucose-Capillary: 169 mg/dL — ABNORMAL HIGH (ref 70–99)
Glucose-Capillary: 129 mg/dL — ABNORMAL HIGH (ref 70–99)
Glucose-Capillary: 132 mg/dL — ABNORMAL HIGH (ref 70–99)

## 2023-05-14 LAB — BASIC METABOLIC PANEL WITH GFR
Anion gap: 7 (ref 5–15)
BUN: 59 mg/dL — ABNORMAL HIGH (ref 8–23)
CO2: 25 mmol/L (ref 22–32)
Calcium: 8.4 mg/dL — ABNORMAL LOW (ref 8.9–10.3)
Chloride: 107 mmol/L (ref 98–111)
Creatinine, Ser: 2.35 mg/dL — ABNORMAL HIGH (ref 0.44–1.00)
GFR, Estimated: 19 mL/min — ABNORMAL LOW (ref >60)
Glucose, Bld: 105 mg/dL — ABNORMAL HIGH (ref 70–99)
Potassium: 4.1 mmol/L (ref 3.5–5.1)
Sodium: 139 mmol/L (ref 135–145)

## 2023-05-14 NOTE — TOC Initial Note (Signed)
 Transition of Care [facility]) - Initial/Assessment Note    Patient Details  Name: [name] MRN: [id number] Date of Birth: 08/17/1927  [facility] CM/SW Contact:    Beckie Busing, RN Phone Number:[phone]  05/14/2023, 4:15 PM  Clinical Narrative:                 TOC acknowledges patient with high risk for readmission. Patient is from home alone where she normally functions independently. Patient states that she does not have any HH services. Current DME (wheelchair, cane & walker). Patient reports that she does have PCP (Schmerge, Marcelino Duster B, NP) and she does follow on a regular basis. Patient reports that she has insurance and access to affordable medication. Currently there are no TOC needs. TOC will continue to follow.   Expected Discharge Plan: Home/Self Care Barriers to Discharge: Continued Medical Work up   Patient Goals and CMS Choice Patient states their goals for this hospitalization and ongoing recovery are:: Ready to get better to go home     [facility] ownership interest in [facility].provided to::  (n/a)    Expected Discharge Plan and Services In-house Referral: NA Discharge Planning Services: CM Consult Post Acute Care Choice: NA Living arrangements for the past 2 months: Single Family Home                 DME Arranged: N/A DME Agency: NA       HH Arranged: NA HH Agency: NA        Prior Living Arrangements/Services Living arrangements for the past 2 months: Single Family Home Lives with:: Self Patient language and need for interpreter reviewed:: Yes Do you feel safe going back to the place where you live?: Yes      Need for Family Participation in Patient Care: No (Comment) Care giver support system in place?: Yes (comment) Current home services: DME (walker, wheelchair, cane) Criminal Activity/Legal Involvement Pertinent to Current Situation/Hospitalization: No - Comment as needed  Activities of Daily Living    ADL Screening (condition at time of admission) Independently performs ADLs?: No Does the patient have a NEW difficulty with bathing/dressing/toileting/self-feeding that is expected to last >3 days?: No Does the patient have a NEW difficulty with getting in/out of bed, walking, or climbing stairs that is expected to last >3 days?: No Does the patient have a NEW difficulty with communication that is expected to last >3 days?: No Is the patient deaf or have difficulty hearing?: No Does the patient have difficulty seeing, even when wearing glasses/contacts?: No Does the patient have difficulty concentrating, remembering, or making decisions?: No  Permission Sought/Granted Permission sought to share information with : Family Supports Permission granted to share information with : Yes, Verbal Permission Granted  Share Information with NAME: [name]     Permission granted to share info w Relationship: son  Permission granted to share info w Contact Information: [phone]  Emotional Assessment Appearance:: Appears stated age Attitude/Demeanor/Rapport: Gracious Affect (typically observed): Pleasant Orientation: : Oriented to Self, Oriented to Place, Oriented to  Time, Oriented to Situation Alcohol / Substance Use: Not Applicable Psych Involvement: No (comment)  Admission diagnosis:  Breast swelling [N63.0] Breast pain [N64.4] SVC syndrome [I87.1] Dislocation of right shoulder joint, initial encounter [S43.004A] Localized swelling of right upper extremity [R22.31] Patient Active Problem List   Diagnosis Date Noted   SVC syndrome 05/12/2023   Acute CVA (cerebrovascular accident) (HCC) 02/13/2023   Anemia of chronic disease 02/12/2023   CVA (cerebral  vascular accident) (HCC) 08/07/2022   History of CAD (coronary artery disease) of artery bypass graft 08/07/2022   Arthritis of right hip 08/06/2021   Unilateral primary osteoarthritis, right knee 08/06/2021   Closed compression  fracture of body of L1 vertebra (HCC) 07/05/2021   Peripheral arterial disease (HCC) 03/17/2021   Onychomycosis 02/28/2021   Right leg pain 07/26/2020   Onychodystrophy 07/26/2020   CKD (chronic kidney disease), stage IV (HCC) 07/19/2020   Hypertensive heart and renal disease 03/22/2020   Gout 12/07/2017   DM type 2 (diabetes mellitus, type 2) (HCC) 02/11/2014   History of coronary artery bypass graft 07/01/2010   MYOCARDIAL INFARCTION, INFERIOR WALL, SUBSEQUENT CARE 05/12/2008   CAD, NATIVE VESSEL 05/12/2008   Hyperlipidemia 05/11/2008   Essential hypertension 05/11/2008   CAD, UNSPECIFIED SITE 05/11/2008   CEREBROVASCULAR DISEASE 05/11/2008   Rheumatoid arthritis (HCC) 05/11/2008   PCP:  Nona Dell, NP Pharmacy:   [facility] - [place], [place] - [address] [place] [address] Phone: [phone] Fax: [phone]     Social Drivers of Health (SDOH) Social History: SDOH Screenings   Food Insecurity: No Food Insecurity (05/13/2023)  Housing: Low Risk  (05/13/2023)  Transportation Needs: No Transportation Needs (05/13/2023)  Utilities: Not At Risk (05/13/2023)  Alcohol Screen: Low Risk  (11/14/2020)  Depression (PHQ2-9): Low Risk  (03/27/2022)  Financial Resource Strain: Low Risk  (03/27/2022)  Physical Activity: Inactive (03/27/2022)  Social Connections: Moderately Integrated (05/13/2023)  Stress: No Stress Concern Present (03/27/2022)  Tobacco Use: Low Risk  (05/13/2023)   SDOH Interventions:     Readmission Risk Interventions    05/14/2023    4:08 PM  Readmission Risk Prevention Plan  Transportation Screening Complete  Medication Review (RN Care Manager) Referral to Pharmacy  PCP or Specialist appointment within 3-5 days of discharge Complete  HRI or Home Care Consult Complete  SW Recovery Care/Counseling Consult Complete  Palliative Care Screening Not Applicable  Skilled Nursing Facility Not Applicable

## 2023-05-14 NOTE — Plan of Care (Signed)

## 2023-05-14 NOTE — Progress Notes (Signed)
 Orthopedic Tech Progress Note Patient Details:  [name] 05-Apr-1927 [id number]  Ortho Devices Type of Ortho Device: Shoulder immobilizer Ortho Device/Splint Location: right Ortho Device/Splint Interventions: Ordered, Application, Adjustment   Post Interventions Patient Tolerated: Well Instructions Provided: Adjustment of device, Care of device  Kizzie Fantasia 05/14/2023, 4:51 PM

## 2023-05-14 NOTE — Plan of Care (Signed)
  Problem: Skin Integrity: Goal: Risk for impaired skin integrity will decrease Outcome: Progressing   Problem: Clinical Measurements: Goal: Will remain free from infection Outcome: Progressing   Problem: Elimination: Goal: Will not experience complications related to bowel motility Outcome: Progressing Goal: Will not experience complications related to urinary retention Outcome: Progressing   Problem: Pain Managment: Goal: General experience of comfort will improve and/or be controlled Outcome: Progressing   Problem: Safety: Goal: Ability to remain free from injury will improve Outcome: Progressing   Problem: Skin Integrity: Goal: Risk for impaired skin integrity will decrease Outcome: Progressing

## 2023-05-14 NOTE — Progress Notes (Signed)
 Progress Note   Patient: [name] WUJ:[id number] DOB: 09-28-1927 DOA: 05/12/2023     2 DOS: the patient was seen and examined on 05/14/2023   Brief hospital course:  89 y/o F admitted for breast pain and swelling, non-traumatic subpectoral hematoma on CT and diffuse edema without loculated abscess on breast US   Also noted subacute anterior subluxation/near dislocation of the glenohumeral joint for which she is following with Orthopedic Surgery outpatient. Patient has no recollection of trauma.    Assessment and Plan:     Subpectoral hematoma  ?mastitis  Unclear etiology, with chronic shoulder dislocation, no recollection of trauma.  Seems to be stable/improving  Discussed with GS PA, recommended compression and monitoring. No indication for evacuation at this time.   Not meeting SIRS criteria No loculated abscess seen on breast US, but will empirically treat for mastitis  pharmacy to dose vancomycin and monitor for development of abscess  Consider de-escalation vs d/c abx tomorrow  Very low suspicion for SVC syndrome at this time  - hold asa, ok to continue plavix  - supportive care, warm compress    Acute on Chronic anemia - hgb stable. continue to monitor    subacute anterior subluxation/near dislocation of the glenohumeral joint  OA R hip Pt reports months of shoulder pain with no history of trauma. Pending R hip replacement in April   - may need reverse shoulder arthorplasty for definitive management when medically stable  - shoulder immobilizer  - Orthopedic Surgery input appreciated  - lives at home alone but has severe limitations between her hip and shoulder.  PT/OT evaluation for dispo recommendations   T2DM - controlled.  Hold jardiance given low GFR  SSI while in house   CAD s/p CABG Hx CVA  Stable.  cont plavix, d/c asa and DAPT not indicated  Resume statin Holding jardiance given below  Cont beta blocker   HTN - cont coreg. Hold  amlodipine and thiazide given low diastolic and bump in Cr. Resume as indicated.   CKD 4-5 - Cr near baseline, but will hold thiazide and jardaince given slight bump in Cr today and GFR <30 Cont monitor, avoid nephrotoxic agents    SCD No IVF  DNR  AHA diet  Monitor/replace electrolytes      Subjective:   Reports continued improvement in her right arm swelling.  Pain adequately controlled. Lives at home alone.   Physical Exam: Vitals:   05/13/23 1808 05/13/23 2146 05/14/23 0859 05/14/23 1311  BP: 139/66 (!) 130/57 (!) 151/45 (!) 120/41  Pulse: 66 62 60 (!) 54  Resp: 18 18  16   Temp: 98.6 F (37 C) 98.1 F (36.7 C) 98.4 F (36.9 C) 97.6 F (36.4 C)  TempSrc: Oral Oral Oral Oral  SpO2: 100% 100% 98% 100%  Weight:      Height:       Physical Exam Vitals reviewed.  HENT:     Nose: Congestion present.     Comments: No facial tenderness   Eyes:     Extraocular Movements: Extraocular movements intact.     Pupils: Pupils are equal, round, and reactive to light.  Neck:     Comments: Some mild b/l cervical lymphadenopathy  Cardiovascular:     Rate and Rhythm: Normal rate and regular rhythm.  Pulmonary:     Effort: Pulmonary effort is normal.     Breath sounds: Normal breath sounds.  Musculoskeletal:     Cervical back: Normal range of motion and neck supple.  Skin:    General: Skin is warm and dry.     Comments: Right breast no erythema, but swelling does seem to be improving. No nipple drainage or overlying skin changes   Neurological:     General: No focal deficit present.     Mental Status: She is alert and oriented to person, place, and time.      Data Reviewed:      Labs on Admission: I have personally reviewed following labs and imaging studies  CBC: Recent Labs  Lab 05/09/23 1446 05/12/23 1354 05/12/23 2302 05/13/23 1055 05/13/23 1504 05/14/23 0546  WBC 7.2 7.7 6.7 5.7 6.3 5.9  NEUTROABS 6.5 5.8  --   --   --   --   HGB 9.4* 9.3* 8.2* 8.0*  7.9* 7.9*  HCT 28.8* 29.8* 25.9* 25.8* 25.5* 25.2*  MCV 95.7 99.7 100.4* 101.6* 102.0* 102.0*  PLT 327 313 250 262 258 231   Basic Metabolic Panel: Recent Labs  Lab 05/09/23 1446 05/12/23 1354 05/12/23 2302 05/13/23 1055 05/13/23 1504 05/14/23 0546  NA 138 141  --  139 138 139  K 5.0 4.5  --  3.8 4.1 4.1  CL 104 106  --  107 108 107  CO2 24 23  --  25 22 25   GLUCOSE 218* 138*  --  214* 149* 105*  BUN 66* 72*  --  58* 59* 59*  CREATININE 1.91* 2.44* 2.23* 2.21* 2.09* 2.35*  CALCIUM 8.7* 8.8*  --  8.4* 8.2* 8.4*   GFR: Estimated Creatinine Clearance: 11.5 mL/min (A) (by C-G formula based on SCr of 2.35 mg/dL (H)). Liver Function Tests: Recent Labs  Lab 05/09/23 1446 05/13/23 1055  AST 23 19  ALT 20 13  ALKPHOS 83 64  BILITOT 0.7 0.5  PROT 6.7 5.5*  ALBUMIN 3.5 2.7*   No results for input(s): "LIPASE", "AMYLASE" in the last 168 hours. No results for input(s): "AMMONIA" in the last 168 hours. Coagulation Profile: No results for input(s): "INR", "PROTIME" in the last 168 hours. Cardiac Enzymes: No results for input(s): "CKTOTAL", "CKMB", "CKMBINDEX", "TROPONINI" in the last 168 hours. BNP (last 3 results) No results for input(s): "PROBNP" in the last 8760 hours. HbA1C: No results for input(s): "HGBA1C" in the last 72 hours. CBG: Recent Labs  Lab 05/13/23 1813 05/13/23 2147 05/14/23 0748 05/14/23 1135 05/14/23 1725  GLUCAP 93 132* 89 169* 129*   Lipid Profile: No results for input(s): "CHOL", "HDL", "LDLCALC", "TRIG", "CHOLHDL", "LDLDIRECT" in the last 72 hours. Thyroid Function Tests: No results for input(s): "TSH", "T4TOTAL", "FREET4", "T3FREE", "THYROIDAB" in the last 72 hours. Anemia Panel: No results for input(s): "VITAMINB12", "FOLATE", "FERRITIN", "TIBC", "IRON", "RETICCTPCT" in the last 72 hours. Urine analysis:    Component Value Date/Time   COLORURINE STRAW (A) 03/07/2023 1323   APPEARANCEUR HAZY (A) 03/07/2023 1323   LABSPEC 1.015 03/07/2023  1323   PHURINE 8.5 (H) 03/07/2023 1323   GLUCOSEU NEGATIVE 03/07/2023 1323   HGBUR TRACE (A) 03/07/2023 1323   BILIRUBINUR NEGATIVE 03/07/2023 1323   BILIRUBINUR negative 11/14/2020 1608   KETONESUR NEGATIVE 03/07/2023 1323   PROTEINUR NEGATIVE 03/07/2023 1323   UROBILINOGEN 0.2 11/14/2020 1608   UROBILINOGEN 0.2 04/26/2009 0014   NITRITE NEGATIVE 03/07/2023 1323   LEUKOCYTESUR SMALL (A) 03/07/2023 1323    Radiological Exams on Admission: DG Knee Right Port Result Date: 05/13/2023 CLINICAL DATA:  Right knee pain. EXAM: PORTABLE RIGHT KNEE - 1-2 VIEW COMPARISON:  None Available. FINDINGS: No acute fracture or dislocation. No  aggressive osseous lesion. There are degenerative changes of the knee joint in the form of moderately reduced tibiofemoral compartment joint space, asymmetrically involving medial compartment along with tibial spiking and tricompartmental osteophytosis. There also changes of chondromalacia patellae. Bilateral meniscal chondrocalcinosis noted. No knee effusion or focal soft tissue swelling. No radiopaque foreign bodies. IMPRESSION: No acute osseous abnormality of the right knee joint. Moderate tricompartmental degenerative joint disease. Electronically Signed   By: Jules Schick M.D.   On: 05/13/2023 10:24   CT Shoulder Right Wo Contrast Result Date: 05/12/2023 CLINICAL DATA:  R shoulder dislocation EXAM: CT OF THE UPPER RIGHT EXTREMITY WITHOUT CONTRAST TECHNIQUE: Multidetector CT imaging of the upper right extremity was performed according to the standard protocol. RADIATION DOSE REDUCTION: This exam was performed according to the departmental dose-optimization program which includes automated exposure control, adjustment of the mA and/or kV according to patient size and/or use of iterative reconstruction technique. COMPARISON:  X-ray right shoulder 05/12/2023, CT chest 05/12/2023, x-ray right shoulder 05/03/2023 FINDINGS: Bones/Joint/Cartilage Similar-appearing anterior  shoulder dislocation with impaction of the humeral head on the anterior glenoid. Associated joint effusion. Severe degenerative changes of the shoulder. Acute minimally displaced humeral head fracture (2:39). Chronic Hill-Sachs deformity. Benign appearing lucent lesion of the humeral head (6:61). Ligaments Suboptimally assessed by CT. Muscles and Tendons Grossly unremarkable. Soft tissues Subcutaneus soft tissue edema. IMPRESSION: 1. Anterior shoulder dislocation with impaction of the humeral head on the anterior glenoid. Associated joint effusion. 2. Acute minimally displaced humeral head fracture. Electronically Signed   By: Tish Frederickson M.D.   On: 05/12/2023 22:23   DG Shoulder Right Result Date: 05/12/2023 CLINICAL DATA:  Possible shoulder dislocation on CT EXAM: RIGHT SHOULDER - 2+ VIEW COMPARISON:  05/12/2023 radiograph and CT FINDINGS: Mild AC joint degenerative change. Advanced glenohumeral degenerative change. Humeral head appears slightly subluxed on the AP view but there is no anterior or posterior displacement with respect to the glenoid on the Y-view. IMPRESSION: Advanced glenohumeral degenerative change. Suggestion of subluxation on AP view shoulder but not confirmed on Y-view, question joint instability related to history of rheumatoid. There is no fracture identified Electronically Signed   By: Jasmine Pang M.D.   On: 05/12/2023 21:17   VAS [place] LOWER EXTREMITY VENOUS (DVT) (ONLY [facility] & [facility]) Result Date: 05/12/2023  Lower Venous DVT Study Patient Name:  Genasis JEZABEL LECKER  Date of Exam:   05/12/2023 Medical Rec #: [id number]                  Accession #:    [id number] Date of Birth: Nov 03, 1927                  Patient Gender: F Patient Age:   75 years Exam Location:  [facility] Procedure:      VAS [place] LOWER EXTREMITY VENOUS (DVT) Referring Phys: Molly Maduro PATERSON --------------------------------------------------------------------------------  Indications: Swelling.  Limitations:  Calcific shadowing, patient unable to position properly. Comparison Study: No previous exams Performing Technologist: Jody Hill RVT, RDMS  Examination Guidelines: A complete evaluation includes B-mode imaging, spectral Doppler, color Doppler, and power Doppler as needed of all accessible portions of each vessel. Bilateral testing is considered an integral part of a complete examination. Limited examinations for reoccurring indications may be performed as noted. The reflux portion of the exam is performed with the patient in reverse Trendelenburg.  +--------+---------------+---------+-----------+----------+--------------------+ RIGHT   CompressibilityPhasicitySpontaneityPropertiesThrombus Aging       +--------+---------------+---------+-----------+----------+--------------------+ CFV     Full  No       Yes                                       +--------+---------------+---------+-----------+----------+--------------------+ SFJ     Full                                                              +--------+---------------+---------+-----------+----------+--------------------+ FV Prox Full           Yes      Yes                                       +--------+---------------+---------+-----------+----------+--------------------+ FV Mid                                               Not well visualized  +--------+---------------+---------+-----------+----------+--------------------+ FV                                                   Not well visualized  Distal                                                                    +--------+---------------+---------+-----------+----------+--------------------+ PFV                    No       Yes                  patent by                                                                 color/doppler        +--------+---------------+---------+-----------+----------+--------------------+ POP     Full            Yes      Yes                                       +--------+---------------+---------+-----------+----------+--------------------+ PTV     Full                                                              +--------+---------------+---------+-----------+----------+--------------------+  PERO                                                 Not well visualized  +--------+---------------+---------+-----------+----------+--------------------+ Limited visualization of FV due to extensive calcific shadowing from overlying artery. Patient also unable to lay leg flat, positioning suboptimal. Visualized portions appear patent. PTV & Peroneals not well visualized due to overlying edema.  +----+---------------+---------+-----------+----------+--------------+ LEFTCompressibilityPhasicitySpontaneityPropertiesThrombus Aging +----+---------------+---------+-----------+----------+--------------+ CFV Full           Yes      Yes                                 +----+---------------+---------+-----------+----------+--------------+     Summary: RIGHT: - There is no evidence of deep vein thrombosis in the lower extremity. However, portions of this examination were limited- see technologist comments above.  - No cystic structure found in the popliteal fossa. Extensive subcutaneous edema of calf and ankle.  LEFT: - No evidence of common femoral vein obstruction.   *See table(s) above for measurements and observations. Electronically signed by Carolynn Sayers on 05/12/2023 at 8:23:49 PM.    Final    UE Venous Duplex ([facility] and [facility] ONLY) Result Date: 05/12/2023 UPPER VENOUS STUDY  Patient Name:  Tyonna SHAUNTEA LOK  Date of Exam:   05/12/2023 Medical Rec #: [id number]                  Accession #:    [id number] Date of Birth: 10-Jul-1927                  Patient Gender: F Patient Age:   79 years Exam Location:  [facility] Procedure:      VAS [place] UPPER EXTREMITY VENOUS DUPLEX Referring Phys:  Molly Maduro PATERSON --------------------------------------------------------------------------------  Indications: Pain, and Swelling Risk Factors: Recently diagnosed with mastitis of right breast. Comparison Study: No previous exams Performing Technologist: Jody Hill RVT, RDMS  Examination Guidelines: A complete evaluation includes B-mode imaging, spectral Doppler, color Doppler, and power Doppler as needed of all accessible portions of each vessel. Bilateral testing is considered an integral part of a complete examination. Limited examinations for reoccurring indications may be performed as noted.  Right Findings: +----------+------------+---------+-----------+----------+--------------------+ RIGHT     CompressiblePhasicitySpontaneousProperties      Summary        +----------+------------+---------+-----------+----------+--------------------+ IJV           Full       Yes       Yes                                   +----------+------------+---------+-----------+----------+--------------------+ Subclavian               Yes       Yes                   patent by                                                              color/doppler     +----------+------------+---------+-----------+----------+--------------------+  Axillary      Full       Yes       Yes                                   +----------+------------+---------+-----------+----------+--------------------+ Brachial      Full       Yes       Yes                                   +----------+------------+---------+-----------+----------+--------------------+ Radial        Full                                                       +----------+------------+---------+-----------+----------+--------------------+ Ulnar         Full                                                       +----------+------------+---------+-----------+----------+--------------------+ Cephalic      Full                                                        +----------+------------+---------+-----------+----------+--------------------+ Basilic       Full                                                       +----------+------------+---------+-----------+----------+--------------------+  Left Findings: +----------+------------+---------+-----------+----------+-------+ LEFT      CompressiblePhasicitySpontaneousPropertiesSummary +----------+------------+---------+-----------+----------+-------+ Subclavian    Full       Yes       Yes                      +----------+------------+---------+-----------+----------+-------+  Summary:  Right: No evidence of deep vein thrombosis in the upper extremity. No evidence of superficial vein thrombosis in the upper extremity. Extensive subcutaneous edema of upper extremity.  Left: No evidence of thrombosis in the subclavian.  *See table(s) above for measurements and observations.  Diagnosing physician: Carolynn Sayers Electronically signed by Carolynn Sayers on 05/12/2023 at 8:23:09 PM.    Final    CT CHEST ABDOMEN PELVIS WO CONTRAST Result Date: 05/12/2023 CLINICAL DATA:  Right-sided swelling to the face, arm, leg, and breast. Right breast swelling began on Thursday. Diagnosed with mastitis on antibiotics. EXAM: CT CHEST, ABDOMEN AND PELVIS WITHOUT CONTRAST TECHNIQUE: Multidetector CT imaging of the chest, abdomen and pelvis was performed following the standard protocol without IV contrast. RADIATION DOSE REDUCTION: This exam was performed according to the departmental dose-optimization program which includes automated exposure control, adjustment of the mA and/or kV according to patient size and/or use of iterative reconstruction technique. COMPARISON:  Chest radiograph 05/12/2023. CT abdomen and pelvis 03/07/2023 FINDINGS: CT CHEST FINDINGS Cardiovascular: Normal heart size.  No pericardial effusions. Calcification in the mitral valve annulus and aorta. Postoperative changes consistent  with coronary bypass. Normal caliber thoracic aorta. Mediastinum/Nodes: Thyroid gland is unremarkable. Esophagus is decompressed. No significant lymphadenopathy. Extensive soft tissue infiltration and edema demonstrated throughout the right shoulder, right breast, and right chest wall extending into the abdomen. Right subpectoral collection measuring 3.1 x 7.5 cm, possibly hematoma. Lungs/Pleura: Mild dependent atelectasis versus fibrosis in the lung bases. No airspace disease or consolidation. No pleural effusion or pneumothorax. Musculoskeletal: Anterior dislocation of the right shoulder. Degenerative changes in the right shoulder. Large right shoulder effusion. Degenerative changes in the left shoulder with mild anterior subluxation. No discrete dislocation. Moderate left shoulder effusion. Sternotomy wires are present. Degenerative changes throughout the spine. No acute displaced rib fractures. CT ABDOMEN PELVIS FINDINGS Hepatobiliary: Cholelithiasis with stones layering in the gallbladder. No inflammatory changes. No bile duct dilatation. No focal liver lesions. Pancreas: Unremarkable. No pancreatic ductal dilatation or surrounding inflammatory changes. Spleen: Normal in size without focal abnormality. Adrenals/Urinary Tract: Adrenal glands are unremarkable. Kidneys are normal, without renal calculi, focal solid lesion, or hydronephrosis. Bladder is unremarkable. Stomach/Bowel: Stomach, small bowel, and colon are not abnormally distended. Stool throughout the colon. No wall thickening or inflammatory changes. Sigmoid colonic diverticulosis without evidence of acute diverticulitis. Appendix is not identified. Vascular/Lymphatic: Aortic atherosclerosis. No enlarged abdominal or pelvic lymph nodes. Reproductive: Uterus and bilateral adnexa are unremarkable. Other: No free air or free fluid in the abdomen. Abdominal wall musculature appears intact. Diffuse soft tissue edema throughout the subcutaneous fat  bilaterally. Musculoskeletal: Degenerative changes throughout the lumbar spine. Compression of the inferior endplate of L1 without change since previous study. Slight anterior subluxation of L3 on L4 is also unchanged. Severe degenerative changes in the right hip with bone on bone appearance. Subcortical cysts. Mild femoral head deformity. Less prominent degenerative changes in the left hip. IMPRESSION: 1. Diffuse soft tissue edema involving the right shoulder, right breast, and right chest wall. No mass or lymphadenopathy identified in the mediastinum or supraclavicular regions. 2. Subpectoral hematoma on the right. 3. Anterior dislocation of the right shoulder with large right shoulder effusion. Moderate left shoulder effusion. Degenerative changes in both shoulders in the spine. 4. No evidence of active pulmonary disease. 5. No evidence of active pulmonary disease. 6. Aortic atherosclerosis. 7. Cholelithiasis without evidence of acute cholecystitis. 8. No evidence of bowel obstruction or inflammation. 9. Degenerative changes in the hips. Electronically Signed   By: Burman Nieves M.D.   On: 05/12/2023 20:09       Time spent: 35 minutes  Author: Loraine Leriche, DO 05/14/2023 7:24 PM  For on call review [email or web address].

## 2023-05-15 ENCOUNTER — Encounter (HOSPITAL_COMMUNITY)
Admission: RE | Admit: 2023-05-15 | Discharge: 2023-05-15 | Disposition: A | Discharge status: Home / Self Care | Source: Ambulatory Visit | Attending: Nurse Practitioner | Admitting: Nurse Practitioner

## 2023-05-15 DIAGNOSIS — I871 Compression of vein: Secondary | ICD-10-CM | POA: Diagnosis not present

## 2023-05-15 LAB — CBC
HCT: 23.9 % — ABNORMAL LOW (ref 36.0–46.0)
Hemoglobin: 7.6 g/dL — ABNORMAL LOW (ref 12.0–15.0)
MCH: 31.7 pg (ref 26.0–34.0)
MCHC: 31.8 g/dL (ref 30.0–36.0)
MCV: 99.6 fL (ref 80.0–100.0)
Platelets: 207 10*3/uL (ref 150–400)
RBC: 2.4 MIL/uL — ABNORMAL LOW (ref 3.87–5.11)
RDW: 13.2 % (ref 11.5–15.5)
WBC: 5.5 10*3/uL (ref 4.0–10.5)
nRBC: 0 % (ref 0.0–0.2)

## 2023-05-15 LAB — BASIC METABOLIC PANEL WITH GFR
Anion gap: 8 (ref 5–15)
BUN: 67 mg/dL — ABNORMAL HIGH (ref 8–23)
CO2: 22 mmol/L (ref 22–32)
Calcium: 8.3 mg/dL — ABNORMAL LOW (ref 8.9–10.3)
Chloride: 107 mmol/L (ref 98–111)
Creatinine, Ser: 2.71 mg/dL — ABNORMAL HIGH (ref 0.44–1.00)
GFR, Estimated: 16 mL/min — ABNORMAL LOW (ref >60)
Glucose, Bld: 97 mg/dL (ref 70–99)
Potassium: 4.2 mmol/L (ref 3.5–5.1)
Sodium: 137 mmol/L (ref 135–145)

## 2023-05-15 LAB — GLUCOSE, CAPILLARY
Glucose-Capillary: 92 mg/dL (ref 70–99)
Glucose-Capillary: 151 mg/dL — ABNORMAL HIGH (ref 70–99)
Glucose-Capillary: 155 mg/dL — ABNORMAL HIGH (ref 70–99)
Glucose-Capillary: 139 mg/dL — ABNORMAL HIGH (ref 70–99)

## 2023-05-15 MED ORDER — POLYETHYLENE GLYCOL 3350 17 G PO PACK
17.0000 g | PACK | Freq: Every day | ORAL | Status: DC
  Administered 2023-05-15 – 2023-05-19 (×5): 17 g via ORAL
  Filled 2023-05-15 (×5): qty 1

## 2023-05-15 MED ORDER — SODIUM CHLORIDE 0.9 % IV SOLN
1.0000 g | Freq: Every day | INTRAVENOUS | Status: DC
  Administered 2023-05-15 – 2023-05-19 (×5): 1 g via INTRAVENOUS
  Filled 2023-05-15 (×6): qty 10

## 2023-05-15 NOTE — Plan of Care (Signed)
  Problem: Clinical Measurements: Goal: Will remain free from infection Outcome: Progressing Goal: Diagnostic test results will improve Outcome: Progressing   Problem: Elimination: Goal: Will not experience complications related to bowel motility Outcome: Progressing Goal: Will not experience complications related to urinary retention Outcome: Progressing   Problem: Safety: Goal: Ability to remain free from injury will improve Outcome: Progressing

## 2023-05-15 NOTE — Progress Notes (Signed)
 PROGRESS NOTE    [name]  ZOX:[id number] DOB: October 12, 1927 DOA: 05/12/2023 PCP: Nona Dell, NP   Brief Narrative: This 89 y/o F admitted for breast pain and swelling, non-traumatic subpectoral hematoma on CT and diffuse edema without loculated abscess on breast US.   She is also noted to have  subacute anterior subluxation/near dislocation of the glenohumeral joint for which she is following with Orthopedic Surgery outpatient. Patient has no recollection of trauma. She was admitted for further evaluation.  Assessment & Plan:   Principal Problem:   SVC syndrome Active Problems:   DM type 2 (diabetes mellitus, type 2) (HCC)   Essential hypertension   Hyperlipidemia   CKD (chronic kidney disease), stage IV (HCC)   Anemia of chronic disease   CAD, UNSPECIFIED SITE   Rheumatoid arthritis (HCC)   History of coronary artery bypass graft   Peripheral arterial disease (HCC)   Subpectoral hematoma / Mastitis : Patient presented with chronic shoulder dislocation, no recollection of trauma Seems to be stable /improving  General surgery recommended compression and monitoring. No indication for evacuation at this time.   Not meeting SIRS criteria. No loculated abscess seen on breast US, but will empirically treat for mastitis. Monitor for development of abscess. Very low suspicion for SVC syndrome at this time. Hold asa, ok to continue Plavix. Continue supportive care, warm compress    Acute on Chronic anemia: Hgb slowly trending down. continue to monitor    Subacute anterior subluxation  Near dislocation of the glenohumeral joint  OA R hip Pt reports months of shoulder pain with no history of trauma. Pending R hip replacement in April   She may need reverse shoulder arthorplasty for definitive management when medically stable  Continue shoulder immobilizer  Orthopedic Surgery input appreciated  She lives at home alone but has severe limitations between her  hip and shoulder.   PT/OT evaluation for dispo recommendations    T2DM Well controlled. Hold jardiance given low GFR  SSI while in house.   CAD s/p CABG Hx CVA  Stable.  Continue plavix, d/c asa and DAPT not indicated  Resume statin. Holding jardiance given below  Cont beta blocker    HTN: Continue coreg. Hold amlodipine and thiazide given low diastolic and bump in Cr. Resume as indicated.    CKD 4-5 Creatinine near baseline, but will hold thiazide and jardaince given slight bump in Cr today and GFR <30 Cont monitor, avoid nephrotoxic agents       DVT prophylaxis: SCDs Code Status: DNR Family Communication: No family at bedside. Disposition Plan:   Status is: Inpatient Remains inpatient appropriate because:  Admitted for subpectoral hematoma,  mastitis   Consultants:  None  Procedures: None  Antimicrobials:  Anti-infectives (From admission, onward)    Start     Dose/Rate Route Frequency Ordered Stop   05/15/23 1200  vancomycin (VANCOREADY) IVPB 500 mg/100 mL  Status:  Discontinued        500 mg 100 mL/hr over 60 Minutes Intravenous Every 48 hours 05/13/23 1042 05/13/23 1133   05/13/23 1100  cefTRIAXone (ROCEPHIN) 1 g in sodium chloride 0.9 % 100 mL IVPB  Status:  Discontinued        1 g 200 mL/hr over 30 Minutes Intravenous Every 24 hours 05/13/23 1024 05/13/23 1133   05/13/23 1045  vancomycin (VANCOCIN) IVPB 1000 mg/200 mL premix  Status:  Discontinued        1,000 mg 200 mL/hr over 60 Minutes Intravenous  Once 05/13/23  1030 05/13/23 1133       Subjective: Patient was seen and examined at bedside.  Overnight events noted. Patient reports doing better,  reports pain is reasonably controlled. She reports having some swelling in the right breast.   Objective: Vitals:   05/14/23 0859 05/14/23 1311 05/14/23 2115 05/15/23 0550  BP: (!) 151/45 (!) 120/41 (!) 157/47 (!) 151/44  Pulse: 60 (!) 54 64 60  Resp:  16 14 14   Temp: 98.4 F (36.9 C) 97.6 F (36.4  C) 99.6 F (37.6 C) 99 F (37.2 C)  TempSrc: Oral Oral Oral Oral  SpO2: 98% 100% 97% 97%  Weight:      Height:        Intake/Output Summary (Last 24 hours) at 05/15/2023 1227 Last data filed at 05/15/2023 1610 Gross per 24 hour  Intake 540 ml  Output 1500 ml  Net -960 ml   Filed Weights   05/13/23 1445  Weight: 57.1 kg    Examination:  General exam: Appears calm and comfortable, deconditioned, not in any acute distress. Respiratory system: CTA Bilaterally. Respiratory effort normal.  RR 15 Chest wall: Right breast no erythema but swelling improving,  no nipple discharge or overlying skin changes, Cardiovascular system: S1 & S2 heard, RRR. No JVD, murmurs, rubs, gallops or clicks. No pedal edema. Gastrointestinal system: Abdomen is nondistended, soft and nontender.  Normal bowel sounds heard. Central nervous system: Alert and oriented X 3. No focal neurological deficits. Extremities: Symmetric 5 x 5 power. Skin: No rashes, lesions or ulcers Psychiatry: Judgement and insight appear normal. Mood & affect appropriate.     Data Reviewed: I have personally reviewed following labs and imaging studies  CBC: Recent Labs  Lab 05/09/23 1446 05/12/23 1354 05/12/23 2302 05/13/23 1055 05/13/23 1504 05/14/23 0546 05/15/23 0507  WBC 7.2 7.7 6.7 5.7 6.3 5.9 5.5  NEUTROABS 6.5 5.8  --   --   --   --   --   HGB 9.4* 9.3* 8.2* 8.0* 7.9* 7.9* 7.6*  HCT 28.8* 29.8* 25.9* 25.8* 25.5* 25.2* 23.9*  MCV 95.7 99.7 100.4* 101.6* 102.0* 102.0* 99.6  PLT 327 313 250 262 258 231 207   Basic Metabolic Panel: Recent Labs  Lab 05/12/23 1354 05/12/23 2302 05/13/23 1055 05/13/23 1504 05/14/23 0546 05/15/23 0507  NA 141  --  139 138 139 137  K 4.5  --  3.8 4.1 4.1 4.2  CL 106  --  107 108 107 107  CO2 23  --  25 22 25 22   GLUCOSE 138*  --  214* 149* 105* 97  BUN 72*  --  58* 59* 59* 67*  CREATININE 2.44* 2.23* 2.21* 2.09* 2.35* 2.71*  CALCIUM 8.8*  --  8.4* 8.2* 8.4* 8.3*    GFR: Estimated Creatinine Clearance: 10 mL/min (A) (by C-G formula based on SCr of 2.71 mg/dL (H)). Liver Function Tests: Recent Labs  Lab 05/09/23 1446 05/13/23 1055  AST 23 19  ALT 20 13  ALKPHOS 83 64  BILITOT 0.7 0.5  PROT 6.7 5.5*  ALBUMIN 3.5 2.7*   No results for input(s): "LIPASE", "AMYLASE" in the last 168 hours. No results for input(s): "AMMONIA" in the last 168 hours. Coagulation Profile: No results for input(s): "INR", "PROTIME" in the last 168 hours. Cardiac Enzymes: No results for input(s): "CKTOTAL", "CKMB", "CKMBINDEX", "TROPONINI" in the last 168 hours. BNP (last 3 results) No results for input(s): "PROBNP" in the last 8760 hours. HbA1C: No results for input(s): "HGBA1C" in the last  72 hours. CBG: Recent Labs  Lab 05/14/23 1135 05/14/23 1725 05/14/23 2117 05/15/23 0815 05/15/23 1116  GLUCAP 169* 129* 132* 92 151*   Lipid Profile: No results for input(s): "CHOL", "HDL", "LDLCALC", "TRIG", "CHOLHDL", "LDLDIRECT" in the last 72 hours. Thyroid Function Tests: No results for input(s): "TSH", "T4TOTAL", "FREET4", "T3FREE", "THYROIDAB" in the last 72 hours. Anemia Panel: No results for input(s): "VITAMINB12", "FOLATE", "FERRITIN", "TIBC", "IRON", "RETICCTPCT" in the last 72 hours. Sepsis Labs: No results for input(s): "PROCALCITON", "LATICACIDVEN" in the last 168 hours.  No results found for this or any previous visit (from the past 240 hours).   Radiology Studies: No results found.  Scheduled Meds:  carvedilol  6.25 mg Oral BID   clopidogrel  75 mg Oral Daily   insulin aspart  0-15 Units Subcutaneous TID WC   insulin aspart  0-5 Units Subcutaneous QHS   pantoprazole  40 mg Oral Daily   polyethylene glycol  17 g Oral Daily   rosuvastatin  10 mg Oral QPM   Continuous Infusions:   LOS: 3 days    Time spent: 50 mins    Willeen Niece, MD [facility]   If 7PM-7AM, please contact night-coverage

## 2023-05-15 NOTE — Evaluation (Signed)
 Physical Therapy Evaluation Patient Details Name: [name] MRN: [id number] DOB: [birth year] Today's Date: 05/15/2023  History of Present Illness  [name] is a 89 y.o. female admitted with R sub pectoral hematoma and R sub acute anterior shoulder subluxation now with R shoulder immobilization. Will f/u outpatient ortho for R shoulder. PMH: anemia, OA R shoulder and hip, DM2, CVA, HTN, and CKD.   Clinical Impression  Pt is a 89 y.o. female with above HPI resulting in the deficits listed below (see PT Problem List). Pt lives alone at baseline and reports she is typically independent with transfers to w/c and ambulates into bathroom at home with use of RW due to tight space- does endorse repeated falls in the past week and increased difficulty with mobility and ADLs due to R shoulder and hip pain. Pt performed bed mobility and transfers with MIN A and propelled w/c with use of UEs and LEs with increased time- increased fatigue during. Pt is currently below baseline mobility level and lives alone with intermittent assist from family. Pt will benefit from continued follow up therapy, <3 hours/day upon d/c to maximize functional mobility to increase independence.          If plan is discharge home, recommend the following: Help with stairs or ramp for entrance;Assist for transportation;Assistance with cooking/housework;A lot of help with bathing/dressing/bathroom;A little help with walking and/or transfers   Can travel by private vehicle   Yes    Equipment Recommendations None recommended by PT  Recommendations for Other Services       Functional Status Assessment Patient has had a recent decline in their functional status and demonstrates the ability to make significant improvements in function in a reasonable and predictable amount of time.     Precautions / Restrictions Precautions Precautions: Fall Restrictions Weight Bearing Restrictions Per Provider Order:  No Other Position/Activity Restrictions: sling in place upon entry. Per provider - Teryl Lucy, MD- pt WBAT and ROM AT with R UE.      Mobility  Bed Mobility Overal bed mobility: Modified Independent             General bed mobility comments: increased time    Transfers Overall transfer level: Needs assistance Equipment used: None Transfers: Sit to/from Stand, Bed to chair/wheelchair/BSC Sit to Stand: Mod assist           General transfer comment: MOD A to power up from lower surface w/c and EOB.    Ambulation/Gait Ambulation/Gait assistance: Min assist Gait Distance (Feet): 8 Feet Assistive device: Rolling walker (2 wheels) Gait Pattern/deviations: Step-to pattern, Narrow base of support, Trunk flexed, Decreased stride length Gait velocity: decreased     General Gait Details: B elbows leaning on RW audible crepitus sounds of R hip with ambulation.  Stairs            Merchant navy officer mobility: Yes Wheelchair propulsion: Both lower extermities, Right upper extremity, Left upper extremity Wheelchair parts: Needs assistance Distance: 55 (58ft x2) Wheelchair Assistance Details (indicate cue type and reason): use of B UEs and B LEs, UE >LEs for w/c propulsion. Instructed pt to propel w/c without use of R UE and pt with increased difficulty, enervy expenditure, and time for problem solving with turns and manuvering into doorways.   Tilt Bed    Modified Rankin (Stroke Patients Only)       Balance Overall balance assessment: Needs assistance Sitting-balance support: Feet supported Sitting balance-[name]: Fair     Standing balance  support: Bilateral upper extremity supported, During functional activity, Reliant on assistive device for balance Standing balance-[name]: Poor Standing balance comment: reliant on external support                             Pertinent Vitals/Pain Pain Assessment Pain  Assessment: No/denies pain    Home Living Family/patient expects to be discharged to:: Private residence Living Arrangements: Alone Available Help at Discharge: Available PRN/intermittently;Personal care attendant Type of Home: House Home Access: Ramped entrance     Alternate Level Stairs-Number of Steps: does not go to second level Home Layout: Two level;Able to live on main level with bedroom/bathroom Home Equipment: Wheelchair - manual;Wheelchair - power;Shower seat;BSC/3in1;Rolling Environmental consultant (2 wheels);Cane - single point;Rollator (4 wheels);Hospital bed (unable to use power wheelchair due to arthritis in hands) Additional Comments: HHPT/OT for ~2weeks. Meals on wheels 1x/day and son provides other meals.    Prior Function Prior Level of Function : Needs assist       Physical Assist : ADLs (physical)     Mobility Comments: reports she has "slips" down to the floor during transfer from bed to w/c. States last week she "went down about 3 times".  Has been using w/c as primary means of mobility due to hip pain and difficulty ambulating. Ambulates with RW into bathroom due to tight space in home- states that [facility] therapists advised her to utilize [facility] in hallway and has grab bar in hallway?[name] ADLs Comments: son's girlfriend assists with bathing and dressing intermittently. When she has no help pt reports she does it on her own, just takes her a while.     Extremity/Trunk Assessment   Upper Extremity Assessment Upper Extremity Assessment: Defer to OT evaluation    Lower Extremity Assessment Lower Extremity Assessment: Generalized weakness    Cervical / Trunk Assessment Cervical / Trunk Assessment: Kyphotic  Communication   Communication Communication: No apparent difficulties    Cognition Arousal: Alert Behavior During Therapy: WFL for tasks assessed/performed   PT - Cognitive impairments: No apparent impairments                         Following commands: Intact        Cueing       General Comments      Exercises     Assessment/Plan    PT Assessment Patient needs continued PT services  PT Problem List Decreased strength;Decreased range of motion;Decreased activity tolerance;Decreased balance;Decreased mobility;Decreased knowledge of use of DME;Decreased safety awareness;Pain       PT Treatment Interventions DME instruction;Gait training;Functional mobility training;Therapeutic activities;Therapeutic exercise;Balance training;Neuromuscular re-education;Wheelchair mobility training;Patient/family education    PT Goals (Current goals can be found in the Care Plan section)  Acute Rehab PT Goals Patient Stated Goal: have less pain with movement and get back to independence PT Goal Formulation: With patient Time For Goal Achievement: 05/29/23 Potential to Achieve Goals: Good Additional Goals Additional Goal #1: Pt will propel wheelchair 140ft with independence    Frequency Min 2X/week     Co-evaluation               AM-PAC PT "6 Clicks" Mobility  Outcome Measure Help needed turning from your back to your side while in a flat bed without using bedrails?: A Little Help needed moving from lying on your back to sitting on the side of a flat bed without using bedrails?: A Little Help needed moving  to and from a bed to a chair (including a wheelchair)?: A Little Help needed standing up from a chair using your arms (e.g., wheelchair or bedside chair)?: A Little Help needed to walk in hospital room?: A Little Help needed climbing 3-5 steps with a railing? : Total 6 Click Score: 16    End of Session Equipment Utilized During Treatment: Gait belt Activity Tolerance: Patient tolerated treatment well Patient left: in chair;with call bell/phone within reach;with chair alarm set (sling in place on R UE) Nurse Communication: Mobility status PT Visit Diagnosis: Unsteadiness on feet (R26.81);Muscle weakness (generalized) (M62.81);History of  falling (Z91.81);Difficulty in walking, not elsewhere classified (R26.2);Pain Pain - Right/Left: Right Pain - part of body: Hip;Shoulder    Time: 7829-5621 PT Time Calculation (min) (ACUTE ONLY): 42 min   Charges:   PT Evaluation $PT Eval Low Complexity: 1 Low PT Treatments $Therapeutic Activity: 23-37 mins PT General Charges $$ ACUTE PT VISIT: 1 Visit       Lyman Speller PT, DPT  Acute Rehabilitation Services  Office [phone] 05/15/2023, 2:15 PM

## 2023-05-15 NOTE — TOC Progression Note (Addendum)
 Transition of Care [facility]) - Progression Note    Patient Details  Name: [name] MRN: [id number] Date of Birth: July 29, 1927  Transition of Care [facility]) CM/SW Contact  Adrian Prows, RN Phone Number: 05/15/2023, 11:26 AM  Clinical Narrative:    Notified by Dennard Nip at [facility] that [facility] PCP had requested agency see pt in home; when agency called family to set up appt; they were informed pt admitted to hospital; Cheri requests pt/family discuss prognosis, and place consult for [facility] or hospice if needed; awaiting PT/OT evals; TOC is following.   Expected Discharge Plan: Home/Self Care Barriers to Discharge: Continued Medical Work up  Expected Discharge Plan and Services In-house Referral: NA Discharge Planning Services: CM Consult Post Acute Care Choice: NA Living arrangements for the past 2 months: Single Family Home                 DME Arranged: N/A DME Agency: NA       HH Arranged: NA HH Agency: NA         Social Determinants of Health (SDOH) Interventions SDOH Screenings   Food Insecurity: No Food Insecurity (05/13/2023)  Housing: Low Risk  (05/13/2023)  Transportation Needs: No Transportation Needs (05/13/2023)  Utilities: Not At Risk (05/13/2023)  Alcohol Screen: Low Risk  (11/14/2020)  Depression (PHQ2-9): Low Risk  (03/27/2022)  Financial Resource Strain: Low Risk  (03/27/2022)  Physical Activity: Inactive (03/27/2022)  Social Connections: Moderately Integrated (05/13/2023)  Stress: No Stress Concern Present (03/27/2022)  Tobacco Use: Low Risk  (05/13/2023)    Readmission Risk Interventions    05/14/2023    4:08 PM  Readmission Risk Prevention Plan  Transportation Screening Complete  Medication Review (RN Care Manager) Referral to Pharmacy  PCP or Specialist appointment within 3-5 days of discharge Complete  HRI or Home Care Consult Complete  SW Recovery Care/Counseling Consult Complete  Palliative Care Screening Not Applicable   Skilled Nursing Facility Not Applicable

## 2023-05-15 NOTE — Evaluation (Signed)
 Occupational Therapy Evaluation Patient Details Name: [name] MRN: [id number] DOB: [birth year] Today's Date: 05/15/2023   History of Present Illness   [name] is a 89 y.o. female admitted with R sub pectoral hematoma and R sub acute anterior shoulder subluxation now with R shoulder immobilization. Will f/u outpatient ortho for R shoulder. PMH: anemia, OA R shoulder and hip, DM2, CVA, HTN, and CKD.     Clinical Impressions PTA, patient lives alone with son and his girlfriend living in the house next door. Patient reports for the past several months, she has fallen multiple times, has had pain and issues with R shoulder, hip (has seen ortho and had planned for potential hip surgery) and had HHOT and PT initiated and uses MOW. Patient has been using a w/c for mobility and a commode and RW for toilet transfers only sponge bathing unless son's GF could assist with showers. Patient presents now with pain, R UE in sling with decreased functional use, B UE ROM, strength, coordination and sensation deficits, balance and activity tolerance limitations impacting all BADL's and functional transfers, mobility and w/c use. Patient requires ongoing skilled OT services while in Acute hospital setting to progress function.Patient will benefit from continued inpatient follow up therapy, <3 hours/day.       If plan is discharge home, recommend the following:   Two people to help with walking and/or transfers;A lot of help with bathing/dressing/bathroom;Assistance with cooking/housework;Direct supervision/assist for medications management;Direct supervision/assist for financial management;Assist for transportation;Help with stairs or ramp for entrance     Functional Status Assessment   Patient has had a recent decline in their functional status and demonstrates the ability to make significant improvements in function in a reasonable and predictable amount of time.     Equipment  Recommendations   None recommended by OT      Precautions/Restrictions   Precautions Precautions: Fall Restrictions Weight Bearing Restrictions Per Provider Order: No Other Position/Activity Restrictions: sling in place upon entry. Per provider - Teryl Lucy, MD- pt WBAT and ROM AT with R UE as per PT eval     Mobility Bed Mobility Overal bed mobility: Needs Assistance Bed Mobility: Supine to Sit, Sit to Supine     Supine to sit: Mod assist Sit to supine: Mod assist   General bed mobility comments: later in day with significant fatigue and dificulty using rails and bed features    Transfers Overall transfer level: Needs assistance Equipment used: None Transfers: Sit to/from Stand, Bed to chair/wheelchair/BSC Sit to Stand: Mod assist           General transfer comment: max a to commode      Balance Overall balance assessment: Needs assistance Sitting-balance support: Feet supported Sitting balance-[name]: Fair     Standing balance support: Bilateral upper extremity supported, During functional activity, Reliant on assistive device for balance Standing balance-[name]: Poor Standing balance comment: reliant on external support                           ADL either performed or assessed with clinical judgement   ADL Overall ADL's : Needs assistance/impaired Eating/Feeding: Set up;Sitting Eating/Feeding Details (indicate cue type and reason): uses L UE > R UE Grooming: Wash/dry hands;Wash/dry face;Oral care;Minimal assistance;Sitting   Upper Body Bathing: Moderate assistance;Sitting;Bed level   Lower Body Bathing: Maximal assistance;Cueing for compensatory techniques;Sitting/lateral leans;Sit to/from stand;Bed level   Upper Body Dressing : Moderate assistance;Sitting   Lower Body Dressing:  Maximal assistance;Cueing for compensatory techniques;Sitting/lateral leans;Sit to/from stand   Toilet Transfer: BSC/3in1;Maximal  Dentist Details (indicate cue type and reason): SPT Toileting- Clothing Manipulation and Hygiene: Maximal assistance       Functional mobility during ADLs: Maximal assistance General ADL Comments: significant difficulty with R UE and gross mobility and generalized weakness     Vision Baseline Vision/History: 0 No visual deficits;1 Wears glasses Ability to See in Adequate Light: 0 Adequate Patient Visual Report: No change from baseline Vision Assessment?: No apparent visual deficits     Perception Perception: Within Functional Limits       Praxis Praxis: [facility]          Extremity/Trunk Assessment Upper Extremity Assessment Upper Extremity Assessment: Generalized weakness;Right hand dominant;RUE deficits/detail;LUE deficits/detail RUE Deficits / Details: significant OA changes with dislocation and R UE sling in place RUE: Unable to fully assess due to immobilization;Unable to fully assess due to pain;Shoulder pain with ROM (R hand dominant) RUE Sensation: decreased light touch RUE Coordination: decreased fine motor;decreased gross motor LUE Deficits / Details: OA changes with o-45 degrees sh flexion LUE Sensation: decreased light touch LUE Coordination: decreased fine motor;decreased gross motor   Lower Extremity Assessment Lower Extremity Assessment: Generalized weakness   Cervical / Trunk Assessment Cervical / Trunk Assessment: Kyphotic   Communication Communication Communication: No apparent difficulties   Cognition Arousal: Alert Behavior During Therapy: WFL for tasks assessed/performed                                 Following commands: Intact       Cueing  General Comments      sling in place, R shoulder edema, reports pu on buttocks from prolonged use of briefs and incontinent of urine and unable to change timely at home   Exercises     Shoulder Instructions      Home Living Family/patient expects to be discharged  to:: Private residence Living Arrangements: Alone Available Help at Discharge: Available PRN/intermittently;Personal care attendant Type of Home: House Home Access: Ramped entrance     Home Layout: Two level;Able to live on main level with bedroom/bathroom Alternate Level Stairs-Number of Steps: does not go to second level   Bathroom Shower/Tub: Producer, television/film/video: Handicapped height Bathroom Accessibility: Yes   Home Equipment: Wheelchair - Engineer, technical sales - power;Shower seat;BSC/3in1;Rolling Environmental consultant (2 wheels);Cane - single point;Rollator (4 wheels);Hospital bed   Additional Comments: HHPT/OT for ~2weeks. Meals on wheels 1x/day and son provides other meals.      Prior Functioning/Environment Prior Level of Function : Needs assist       Physical Assist : ADLs (physical)     Mobility Comments: reports she has "slips" down to the floor during transfer from bed to w/c. States last week she "went down about 3 times".  Has been using w/c as primary means of mobility due to hip pain and difficulty ambulating. Ambulates with RW into bathroom due to tight space in home- states that [facility] therapists advised her to utilize [facility] in hallway and has grab bar in hallway?[name] ADLs Comments: son's girlfriend assists with bathing and dressing intermittently. When she has no help pt reports she does it on her own, just takes her a while.    OT Problem List: Decreased strength;Decreased range of motion;Decreased activity tolerance;Impaired balance (sitting and/or standing);Decreased coordination;Decreased knowledge of use of DME or AE;Decreased knowledge of precautions;Impaired sensation;Impaired UE functional use;Pain  OT Treatment/Interventions: Self-care/ADL training;Therapeutic exercise;Neuromuscular education;Energy conservation;DME and/or AE instruction;Modalities;Manual therapy;Therapeutic activities;Patient/family education;Balance training      OT Goals(Current goals can be  found in the care plan section)   Acute Rehab OT Goals Patient Stated Goal: to be able to [facility] OT Goal Formulation: With patient Time For Goal Achievement: 05/29/23 Potential to Achieve Goals: Fair ADL Goals Pt Will Perform Upper Body Bathing: with contact guard assist;sitting Pt Will Perform Upper Body Dressing: with contact guard assist;sitting Pt Will Transfer to Toilet: with min assist;bedside commode;stand pivot transfer   OT Frequency:  Min 2X/week    AM-PAC OT "6 Clicks" Daily Activity     Outcome Measure Help from another person eating meals?: A Little Help from another person taking care of personal grooming?: A Little Help from another person toileting, which includes using toliet, bedpan, or urinal?: A Lot Help from another person bathing (including washing, rinsing, drying)?: A Lot Help from another person to put on and taking off regular upper body clothing?: A Lot Help from another person to put on and taking off regular lower body clothing?: A Lot 6 Click Score: 14   End of Session Equipment Utilized During Treatment: Gait belt Nurse Communication: Mobility status;Precautions  Activity Tolerance: Patient limited by fatigue;Patient limited by pain Patient left: in bed;with call bell/phone within reach;with bed alarm set  OT Visit Diagnosis: Unsteadiness on feet (R26.81);History of falling (Z91.81);Other abnormalities of gait and mobility (R26.89);Muscle weakness (generalized) (M62.81);Pain Pain - Right/Left: Right Pain - part of body: Shoulder                Time: 1425-1447 OT Time Calculation (min): 22 min Charges:  OT General Charges $OT Visit: 1 Visit OT Evaluation $OT Eval Moderate Complexity: 1 Mod OT Treatments $Self Care/Home Management : 8-22 mins  Jakhari Space OT/L Acute Rehabilitation Department  [phone]  05/15/2023, 4:01 PM

## 2023-05-16 DIAGNOSIS — I871 Compression of vein: Secondary | ICD-10-CM | POA: Diagnosis not present

## 2023-05-16 LAB — GLUCOSE, CAPILLARY
Glucose-Capillary: 230 mg/dL — ABNORMAL HIGH (ref 70–99)
Glucose-Capillary: 245 mg/dL — ABNORMAL HIGH (ref 70–99)
Glucose-Capillary: 120 mg/dL — ABNORMAL HIGH (ref 70–99)
Glucose-Capillary: 155 mg/dL — ABNORMAL HIGH (ref 70–99)

## 2023-05-16 NOTE — Plan of Care (Signed)
   Problem: Coping: Goal: Ability to adjust to condition or change in health will improve Outcome: Progressing   Problem: Fluid Volume: Goal: Ability to maintain a balanced intake and output will improve Outcome: Progressing

## 2023-05-16 NOTE — Plan of Care (Signed)

## 2023-05-16 NOTE — Progress Notes (Signed)
 PROGRESS NOTE    Atheena Kandis Fantasia Iqbal  VWU:[id number] DOB: Jun 01, 1927 DOA: 05/12/2023 PCP: Nona Dell, NP   Brief Narrative: This 89 y/o F admitted for breast pain and swelling, non-traumatic subpectoral hematoma on CT and diffuse edema without loculated abscess on breast US.   She is also noted to have  subacute anterior subluxation/near dislocation of the glenohumeral joint for which she is following with Orthopedic Surgery outpatient. Patient has no recollection of trauma. She was admitted for further evaluation.  Assessment & Plan:   Principal Problem:   SVC syndrome Active Problems:   DM type 2 (diabetes mellitus, type 2) (HCC)   Essential hypertension   Hyperlipidemia   CKD (chronic kidney disease), stage IV (HCC)   Anemia of chronic disease   CAD, UNSPECIFIED SITE   Rheumatoid arthritis (HCC)   History of coronary artery bypass graft   Peripheral arterial disease (HCC)   Subpectoral hematoma / Mastitis : Patient presented with chronic shoulder dislocation, no recollection of trauma Seems to be stable /improving  General surgery recommended compression and monitoring. No indication for evacuation at this time.   Not meeting SIRS criteria. No loculated abscess seen on breast US, but will empirically treat for mastitis. Monitor for development of abscess. Very low suspicion for SVC syndrome at this time. Hold asa, ok to continue Plavix. Continue supportive care, warm compresses.  Continue ceftriaxone for mastitis.   Acute on Chronic anemia: Hgb slowly trending down. continue to monitor    Subacute anterior subluxation  Near dislocation of the glenohumeral joint  OA R hip Pt reports months of shoulder pain with no history of trauma. Pending R hip replacement in April   She may need reverse shoulder arthorplasty for definitive management when medically stable. Continue shoulder immobilizer.  Orthopedic Surgery input appreciated  She lives at home alone  but has severe limitations between her hip and shoulder.   PT/OT evaluation for dispo recommendations    T2DM Well controlled. Hold jardiance given low GFR  SSI while in house.   CAD s/p CABG Hx CVA  Stable.  Continue plavix, d/c asa and DAPT not indicated. Resume statin. Holding jardiance given below  Cont beta blocker    HTN: Continue coreg. Hold amlodipine and thiazide given low diastolic and bump in Cr. Resume as indicated.    CKD 4-5 Creatinine near baseline, but will hold thiazide and jardaince given slight bump in Cr today and GFR <30 Cont monitor, avoid nephrotoxic agents       DVT prophylaxis: SCDs Code Status: DNR Family Communication: No family at bedside. Disposition Plan:   Status is: Inpatient Remains inpatient appropriate because:  Admitted for subpectoral hematoma,  mastitis   Consultants:  None  Procedures: None  Antimicrobials:  Anti-infectives (From admission, onward)    Start     Dose/Rate Route Frequency Ordered Stop   05/15/23 1400  cefTRIAXone (ROCEPHIN) 1 g in sodium chloride 0.9 % 100 mL IVPB        1 g 200 mL/hr over 30 Minutes Intravenous Daily 05/15/23 1313     05/15/23 1200  vancomycin (VANCOREADY) IVPB 500 mg/100 mL  Status:  Discontinued        500 mg 100 mL/hr over 60 Minutes Intravenous Every 48 hours 05/13/23 1042 05/13/23 1133   05/13/23 1100  cefTRIAXone (ROCEPHIN) 1 g in sodium chloride 0.9 % 100 mL IVPB  Status:  Discontinued        1 g 200 mL/hr over 30 Minutes Intravenous Every 24  hours 05/13/23 1024 05/13/23 1133   05/13/23 1045  vancomycin (VANCOCIN) IVPB 1000 mg/200 mL premix  Status:  Discontinued        1,000 mg 200 mL/hr over 60 Minutes Intravenous  Once 05/13/23 1030 05/13/23 1133       Subjective: Patient was seen and examined at bedside.  Overnight events noted. Patient reports feeling better, reports pain is reasonably controlled. She reports having some swelling in the right breast.   Objective: Vitals:    05/15/23 0550 05/15/23 1316 05/15/23 2156 05/16/23 0420  BP: (!) 151/44 (!) 131/47 (!) 137/50 (!) 141/39  Pulse: 60 60 67 62  Resp: 14 16 14 14   Temp: 99 F (37.2 C) 98.2 F (36.8 C) 100 F (37.8 C) 98.7 F (37.1 C)  TempSrc: Oral Oral Oral Oral  SpO2: 97% 98% 96% 95%  Weight:      Height:        Intake/Output Summary (Last 24 hours) at 05/16/2023 1328 Last data filed at 05/16/2023 0900 Gross per 24 hour  Intake 100 ml  Output 1300 ml  Net -1200 ml   Filed Weights   05/13/23 1445  Weight: 57.1 kg    Examination:  General exam: Appears calm and comfortable, deconditioned, not in any acute distress. Respiratory system: CTA Bilaterally. Respiratory effort normal.  RR 15 Chest wall: Right breast no erythema but swelling improving,  no nipple discharge or overlying skin changes, Cardiovascular system: S1 & S2 heard, RRR. No JVD, murmurs, rubs, gallops or clicks. No pedal edema. Gastrointestinal system: Abdomen is nondistended, soft and nontender.  Normal bowel sounds heard. Central nervous system: Alert and oriented X 3. No focal neurological deficits. Extremities: No edema, no cyanosis, no clubbing. Skin: No rashes, lesions or ulcers Psychiatry: Judgement and insight appear normal. Mood & affect appropriate.     Data Reviewed: I have personally reviewed following labs and imaging studies  CBC: Recent Labs  Lab 05/09/23 1446 05/12/23 1354 05/12/23 2302 05/13/23 1055 05/13/23 1504 05/14/23 0546 05/15/23 0507  WBC 7.2 7.7 6.7 5.7 6.3 5.9 5.5  NEUTROABS 6.5 5.8  --   --   --   --   --   HGB 9.4* 9.3* 8.2* 8.0* 7.9* 7.9* 7.6*  HCT 28.8* 29.8* 25.9* 25.8* 25.5* 25.2* 23.9*  MCV 95.7 99.7 100.4* 101.6* 102.0* 102.0* 99.6  PLT 327 313 250 262 258 231 207   Basic Metabolic Panel: Recent Labs  Lab 05/12/23 1354 05/12/23 2302 05/13/23 1055 05/13/23 1504 05/14/23 0546 05/15/23 0507  NA 141  --  139 138 139 137  K 4.5  --  3.8 4.1 4.1 4.2  CL 106  --  107 108  107 107  CO2 23  --  25 22 25 22   GLUCOSE 138*  --  214* 149* 105* 97  BUN 72*  --  58* 59* 59* 67*  CREATININE 2.44* 2.23* 2.21* 2.09* 2.35* 2.71*  CALCIUM 8.8*  --  8.4* 8.2* 8.4* 8.3*   GFR: Estimated Creatinine Clearance: 10 mL/min (A) (by C-G formula based on SCr of 2.71 mg/dL (H)). Liver Function Tests: Recent Labs  Lab 05/09/23 1446 05/13/23 1055  AST 23 19  ALT 20 13  ALKPHOS 83 64  BILITOT 0.7 0.5  PROT 6.7 5.5*  ALBUMIN 3.5 2.7*   No results for input(s): "LIPASE", "AMYLASE" in the last 168 hours. No results for input(s): "AMMONIA" in the last 168 hours. Coagulation Profile: No results for input(s): "INR", "PROTIME" in the last 168 hours.  Cardiac Enzymes: No results for input(s): "CKTOTAL", "CKMB", "CKMBINDEX", "TROPONINI" in the last 168 hours. BNP (last 3 results) No results for input(s): "PROBNP" in the last 8760 hours. HbA1C: No results for input(s): "HGBA1C" in the last 72 hours. CBG: Recent Labs  Lab 05/15/23 1116 05/15/23 1601 05/15/23 2159 05/16/23 0740 05/16/23 1122  GLUCAP 151* 155* 139* 230* 245*   Lipid Profile: No results for input(s): "CHOL", "HDL", "LDLCALC", "TRIG", "CHOLHDL", "LDLDIRECT" in the last 72 hours. Thyroid Function Tests: No results for input(s): "TSH", "T4TOTAL", "FREET4", "T3FREE", "THYROIDAB" in the last 72 hours. Anemia Panel: No results for input(s): "VITAMINB12", "FOLATE", "FERRITIN", "TIBC", "IRON", "RETICCTPCT" in the last 72 hours. Sepsis Labs: No results for input(s): "PROCALCITON", "LATICACIDVEN" in the last 168 hours.  No results found for this or any previous visit (from the past 240 hours).   Radiology Studies: No results found.  Scheduled Meds:  carvedilol  6.25 mg Oral BID   clopidogrel  75 mg Oral Daily   insulin aspart  0-15 Units Subcutaneous TID WC   insulin aspart  0-5 Units Subcutaneous QHS   pantoprazole  40 mg Oral Daily   polyethylene glycol  17 g Oral Daily   rosuvastatin  10 mg Oral QPM    Continuous Infusions:  cefTRIAXone (ROCEPHIN)  IV 1 g (05/16/23 1045)     LOS: 4 days    Time spent: 35 mins    Willeen Niece, MD [facility]   If 7PM-7AM, please contact night-coverage

## 2023-05-16 NOTE — Progress Notes (Signed)
 Physical Therapy Treatment Patient Details Name: [name] MRN: [id number] DOB: 06-09-1927 Today's Date: 05/16/2023   History of Present Illness [name] is a 89 y.o. female admitted with R sub pectoral hematoma and R sub acute anterior shoulder subluxation now with R shoulder immobilization. Will f/u outpatient ortho for R shoulder. PMH: anemia, OA R shoulder and hip, DM2, CVA, HTN, and CKD.    PT Comments  Pt agreeable to mobilize and assisted with standing and pivoting to recliner.  Pt requiring more assist today (at least mod +2) to transfer.  Pt felt unable to tolerate ambulation.  Pt repositioned in recliner and lunch arrived.  Pt requested opening containers (on lunch tray) as she is unable at this time.  Current d/c plan remains appropriate.  Patient will benefit from continued inpatient follow up therapy, <3 hours/day.     If plan is discharge home, recommend the following: Help with stairs or ramp for entrance;Assist for transportation;Assistance with cooking/housework;A lot of help with bathing/dressing/bathroom;A lot of help with walking and/or transfers   Can travel by private vehicle     No  Equipment Recommendations  None recommended by PT    Recommendations for Other Services       Precautions / Restrictions Precautions Precautions: Fall Restrictions Other Position/Activity Restrictions: sling in place upon entry. Per evaluation note: "Per provider - Teryl Lucy, MD- pt WBAT and ROM AT with R UE"     Mobility  Bed Mobility Overal bed mobility: Needs Assistance Bed Mobility: Supine to Sit     Supine to sit: Max assist, +2 for physical assistance     General bed mobility comments: attempting to initiate however pain and weakness limiting; assisted with LEs over EOB and trunk upright, scoot to EOB utilizing bed pad; maintained R sling    Transfers Overall transfer level: Needs assistance Equipment used: None Transfers: Sit to/from  Stand, Bed to chair/wheelchair/BSC Sit to Stand: +2 physical assistance, Mod assist Stand pivot transfers: Mod assist, +2 physical assistance         General transfer comment: verbal cues for positioning, provided RW for pt to use on Left hand and therapist guided on Right since sling was maintained; pt with significant difficulty performing transfers and required mod assist throughout; audible crepitus right hip    Ambulation/Gait                   Stairs             Wheelchair Mobility     Tilt Bed    Modified Rankin (Stroke Patients Only)       Balance Overall balance assessment: Needs assistance         Standing balance support: Bilateral upper extremity supported, During functional activity, Reliant on assistive device for balance Standing balance-[name]: Zero                              Communication Communication Communication: No apparent difficulties  Cognition Arousal: Alert Behavior During Therapy: WFL for tasks assessed/performed   PT - Cognitive impairments: No apparent impairments                         Following commands: Intact      Cueing    Exercises      General Comments        Pertinent Vitals/Pain Pain Assessment Pain Assessment: Faces Faces Pain Scale: Hurts  even more Pain Location: r shoulder and r hip Pain Descriptors / Indicators: Sore, Aching, Tender Pain Intervention(s): Repositioned, Monitored during session    Home Living                          Prior Function            PT Goals (current goals can now be found in the care plan section) Progress towards PT goals: Progressing toward goals    Frequency    Min 2X/week      PT Plan      Co-evaluation              AM-PAC PT "6 Clicks" Mobility   Outcome Measure  Help needed turning from your back to your side while in a flat bed without using bedrails?: A Lot Help needed moving from lying on your  back to sitting on the side of a flat bed without using bedrails?: Total Help needed moving to and from a bed to a chair (including a wheelchair)?: Total Help needed standing up from a chair using your arms (e.g., wheelchair or bedside chair)?: Total Help needed to walk in hospital room?: Total Help needed climbing 3-5 steps with a railing? : Total 6 Click Score: 7    End of Session Equipment Utilized During Treatment: Gait belt Activity Tolerance: Patient limited by fatigue Patient left: in chair;with call bell/phone within reach;with chair alarm set;with family/visitor present   PT Visit Diagnosis: Unsteadiness on feet (R26.81);Muscle weakness (generalized) (M62.81);Difficulty in walking, not elsewhere classified (R26.2)     Time: 1610-9604 PT Time Calculation (min) (ACUTE ONLY): 18 min  Charges:    $Therapeutic Activity: 8-22 mins PT General Charges $$ ACUTE PT VISIT: 1 Visit                    Paulino Door, DPT Physical Therapist Acute Rehabilitation Services Office: [phone]    [name] [place] 05/16/2023, 2:52 PM

## 2023-05-17 DIAGNOSIS — I871 Compression of vein: Secondary | ICD-10-CM | POA: Diagnosis not present

## 2023-05-17 LAB — BASIC METABOLIC PANEL WITH GFR
Anion gap: 9 (ref 5–15)
BUN: 64 mg/dL — ABNORMAL HIGH (ref 8–23)
CO2: 22 mmol/L (ref 22–32)
Calcium: 8.6 mg/dL — ABNORMAL LOW (ref 8.9–10.3)
Chloride: 105 mmol/L (ref 98–111)
Creatinine, Ser: 2.17 mg/dL — ABNORMAL HIGH (ref 0.44–1.00)
GFR, Estimated: 21 mL/min — ABNORMAL LOW (ref >60)
Glucose, Bld: 102 mg/dL — ABNORMAL HIGH (ref 70–99)
Potassium: 4.7 mmol/L (ref 3.5–5.1)
Sodium: 136 mmol/L (ref 135–145)

## 2023-05-17 LAB — GLUCOSE, CAPILLARY
Glucose-Capillary: 121 mg/dL — ABNORMAL HIGH (ref 70–99)
Glucose-Capillary: 171 mg/dL — ABNORMAL HIGH (ref 70–99)
Glucose-Capillary: 117 mg/dL — ABNORMAL HIGH (ref 70–99)
Glucose-Capillary: 121 mg/dL — ABNORMAL HIGH (ref 70–99)

## 2023-05-17 LAB — CBC
HCT: 27.1 % — ABNORMAL LOW (ref 36.0–46.0)
Hemoglobin: 8.2 g/dL — ABNORMAL LOW (ref 12.0–15.0)
MCH: 31.4 pg (ref 26.0–34.0)
MCHC: 30.3 g/dL (ref 30.0–36.0)
MCV: 103.8 fL — ABNORMAL HIGH (ref 80.0–100.0)
Platelets: 208 10*3/uL (ref 150–400)
RBC: 2.61 MIL/uL — ABNORMAL LOW (ref 3.87–5.11)
RDW: 13.1 % (ref 11.5–15.5)
WBC: 5.4 10*3/uL (ref 4.0–10.5)
nRBC: 0 % (ref 0.0–0.2)

## 2023-05-17 LAB — MAGNESIUM: Magnesium: 2.1 mg/dL (ref 1.7–2.4)

## 2023-05-17 LAB — PHOSPHORUS: Phosphorus: 4.7 mg/dL — ABNORMAL HIGH (ref 2.5–4.6)

## 2023-05-17 MED ORDER — ACETAMINOPHEN 325 MG PO TABS
325.0000 mg | ORAL_TABLET | Freq: Once | ORAL | Status: AC | PRN
  Administered 2023-05-17: 325 mg via ORAL
  Filled 2023-05-17: qty 1

## 2023-05-17 NOTE — Plan of Care (Signed)

## 2023-05-17 NOTE — Progress Notes (Signed)
 PROGRESS NOTE    [name]  WGN:[id number] DOB: 15-Feb-1928 DOA: 05/12/2023 PCP: Nona Dell, NP   Brief Narrative: This 89 y/o F admitted for breast pain and swelling, non-traumatic subpectoral hematoma on CT and diffuse edema without loculated abscess on breast US.   She is also noted to have  subacute anterior subluxation/near dislocation of the glenohumeral joint for which she is following with Orthopedic Surgery outpatient. Patient has no recollection of trauma. She was admitted for further evaluation.  Assessment & Plan:   Principal Problem:   SVC syndrome Active Problems:   DM type 2 (diabetes mellitus, type 2) (HCC)   Essential hypertension   Hyperlipidemia   CKD (chronic kidney disease), stage IV (HCC)   Anemia of chronic disease   CAD, UNSPECIFIED SITE   Rheumatoid arthritis (HCC)   History of coronary artery bypass graft   Peripheral arterial disease (HCC)   Subpectoral hematoma / Mastitis : Patient presented with chronic shoulder dislocation, no recollection of trauma. Seems to be stable /improving  General surgery recommended compression and monitoring. No indication for evacuation at this time.   Not meeting SIRS criteria. No loculated abscess seen on breast US, but will empirically treat for mastitis. Monitor for development of abscess. Very low suspicion for SVC syndrome at this time. Hold asa, ok to continue Plavix. Continue supportive care, warm compresses.  Continue ceftriaxone for mastitis.   Acute on Chronic anemia: H/H remains stable.   Subacute anterior subluxation  Near dislocation of the glenohumeral joint  OA R hip Pt reports months of shoulder pain with no history of trauma. Pending R hip replacement in April   She may need reverse shoulder arthorplasty for definitive management when medically stable. Continue shoulder immobilizer.  Orthopedic Surgery input appreciated  She lives at home alone but has severe limitations  between her hip and shoulder.   PT/OT evaluation for dispo recommendations    T2DM Well controlled. Hold jardiance given low GFR  SSI while in house.   CAD s/p CABG Hx CVA  Stable.  Continue plavix, d/c asa and DAPT not indicated. Resume statin. Holding jardiance given below  Cont beta blocker    HTN: Continue coreg. Hold amlodipine and thiazide given low diastolic and bump in Cr. Resume as indicated.    CKD 4-5 Creatinine near baseline, but will hold thiazide and jardaince given slight bump in Cr today and GFR <30 Cont monitor, avoid nephrotoxic agents       DVT prophylaxis: SCDs Code Status: DNR Family Communication: No family at bedside. Disposition Plan:   Status is: Inpatient Remains inpatient appropriate because:  Admitted for subpectoral hematoma,  mastitis, She is medically clear,  awaiting SNF placement.   Consultants:  None  Procedures: None  Antimicrobials:  Anti-infectives (From admission, onward)    Start     Dose/Rate Route Frequency Ordered Stop   05/15/23 1400  cefTRIAXone (ROCEPHIN) 1 g in sodium chloride 0.9 % 100 mL IVPB        1 g 200 mL/hr over 30 Minutes Intravenous Daily 05/15/23 1313     05/15/23 1200  vancomycin (VANCOREADY) IVPB 500 mg/100 mL  Status:  Discontinued        500 mg 100 mL/hr over 60 Minutes Intravenous Every 48 hours 05/13/23 1042 05/13/23 1133   05/13/23 1100  cefTRIAXone (ROCEPHIN) 1 g in sodium chloride 0.9 % 100 mL IVPB  Status:  Discontinued        1 g 200 mL/hr over 30 Minutes  Intravenous Every 24 hours 05/13/23 1024 05/13/23 1133   05/13/23 1045  vancomycin (VANCOCIN) IVPB 1000 mg/200 mL premix  Status:  Discontinued        1,000 mg 200 mL/hr over 60 Minutes Intravenous  Once 05/13/23 1030 05/13/23 1133       Subjective: Patient was seen and examined at bedside.  Overnight events noted. Patient reports feeling much improved, pain is reasonably controlled. She reports having some swelling in the right breast.    Objective: Vitals:   05/16/23 0420 05/16/23 1346 05/16/23 2000 05/17/23 0531  BP: (!) 141/39 (!) 129/51 (!) 138/43 (!) 161/55  Pulse: 62 (!) 58 60 (!) 59  Resp: 14 16 18 18   Temp: 98.7 F (37.1 C) 98.3 F (36.8 C) 98.2 F (36.8 C) 98.4 F (36.9 C)  TempSrc: Oral Oral Oral Oral  SpO2: 95% 99% 100% 100%  Weight:      Height:        Intake/Output Summary (Last 24 hours) at 05/17/2023 1124 Last data filed at 05/17/2023 0500 Gross per 24 hour  Intake 119.43 ml  Output 1700 ml  Net -1580.57 ml   Filed Weights   05/13/23 1445  Weight: 57.1 kg    Examination:  General exam: Appears calm , comfortable, deconditioned, not in any acute distress. Respiratory system: CTA Bilaterally. Respiratory effort normal.  RR 15 Chest wall: Right breast no erythema but swelling improving,  no nipple discharge or overlying skin changes, Cardiovascular system: S1 & S2 heard, RRR. No JVD, murmurs, rubs, gallops or clicks. No pedal edema. Gastrointestinal system: Abdomen is nondistended, soft and nontender.  Normal bowel sounds heard. Central nervous system: Alert and oriented X 3. No focal neurological deficits. Extremities: No edema, no cyanosis, no clubbing. Skin: No rashes, lesions or ulcers Psychiatry: Judgement and insight appear normal. Mood & affect appropriate.     Data Reviewed: I have personally reviewed following labs and imaging studies  CBC: Recent Labs  Lab 05/12/23 1354 05/12/23 2302 05/13/23 1055 05/13/23 1504 05/14/23 0546 05/15/23 0507 05/17/23 0751  WBC 7.7   < > 5.7 6.3 5.9 5.5 5.4  NEUTROABS 5.8  --   --   --   --   --   --   HGB 9.3*   < > 8.0* 7.9* 7.9* 7.6* 8.2*  HCT 29.8*   < > 25.8* 25.5* 25.2* 23.9* 27.1*  MCV 99.7   < > 101.6* 102.0* 102.0* 99.6 103.8*  PLT 313   < > 262 258 231 207 208   < > = values in this interval not displayed.   Basic Metabolic Panel: Recent Labs  Lab 05/13/23 1055 05/13/23 1504 05/14/23 0546 05/15/23 0507 05/17/23 0751   NA 139 138 139 137 136  K 3.8 4.1 4.1 4.2 4.7  CL 107 108 107 107 105  CO2 25 22 25 22 22   GLUCOSE 214* 149* 105* 97 102*  BUN 58* 59* 59* 67* 64*  CREATININE 2.21* 2.09* 2.35* 2.71* 2.17*  CALCIUM 8.4* 8.2* 8.4* 8.3* 8.6*  MG  --   --   --   --  2.1  PHOS  --   --   --   --  4.7*   GFR: Estimated Creatinine Clearance: 12.5 mL/min (A) (by C-G formula based on SCr of 2.17 mg/dL (H)). Liver Function Tests: Recent Labs  Lab 05/13/23 1055  AST 19  ALT 13  ALKPHOS 64  BILITOT 0.5  PROT 5.5*  ALBUMIN 2.7*   No results for  input(s): "LIPASE", "AMYLASE" in the last 168 hours. No results for input(s): "AMMONIA" in the last 168 hours. Coagulation Profile: No results for input(s): "INR", "PROTIME" in the last 168 hours. Cardiac Enzymes: No results for input(s): "CKTOTAL", "CKMB", "CKMBINDEX", "TROPONINI" in the last 168 hours. BNP (last 3 results) No results for input(s): "PROBNP" in the last 8760 hours. HbA1C: No results for input(s): "HGBA1C" in the last 72 hours. CBG: Recent Labs  Lab 05/16/23 0740 05/16/23 1122 05/16/23 1611 05/16/23 2121 05/17/23 0718  GLUCAP 230* 245* 120* 155* 121*   Lipid Profile: No results for input(s): "CHOL", "HDL", "LDLCALC", "TRIG", "CHOLHDL", "LDLDIRECT" in the last 72 hours. Thyroid Function Tests: No results for input(s): "TSH", "T4TOTAL", "FREET4", "T3FREE", "THYROIDAB" in the last 72 hours. Anemia Panel: No results for input(s): "VITAMINB12", "FOLATE", "FERRITIN", "TIBC", "IRON", "RETICCTPCT" in the last 72 hours. Sepsis Labs: No results for input(s): "PROCALCITON", "LATICACIDVEN" in the last 168 hours.  No results found for this or any previous visit (from the past 240 hours).   Radiology Studies: No results found.  Scheduled Meds:  carvedilol  6.25 mg Oral BID   clopidogrel  75 mg Oral Daily   insulin aspart  0-15 Units Subcutaneous TID WC   insulin aspart  0-5 Units Subcutaneous QHS   pantoprazole  40 mg Oral Daily    polyethylene glycol  17 g Oral Daily   rosuvastatin  10 mg Oral QPM   Continuous Infusions:  cefTRIAXone (ROCEPHIN)  IV 1 g (05/17/23 1116)     LOS: 5 days    Time spent: 35 mins    Willeen Niece, MD [facility]   If 7PM-7AM, please contact night-coverage

## 2023-05-17 NOTE — NC FL2 (Signed)
 [place] MEDICAID FL2 LEVEL OF CARE FORM     IDENTIFICATION  Patient Name: [name] Birthdate: 16-Mar-1927 Sex: female Admission Date (Current Location): 05/12/2023  [facility] and [place] Number:  Producer, television/film/video and Address:  [facility],  [address], [place] [address]      Provider Number: [id number]  Attending Physician Name and Address:  Willeen Niece, MD  Relative Name and Phone Number:  son, Shazia Mitchener @ [phone]    Current Level of Care: Hospital Recommended Level of Care: Skilled Nursing Facility Prior Approval Number:    Date Approved/Denied:   PASRR Number: [id number]  Discharge Plan: SNF    Current Diagnoses: Patient Active Problem List   Diagnosis Date Noted   SVC syndrome 05/12/2023   Acute CVA (cerebrovascular accident) (HCC) 02/13/2023   Anemia of chronic disease 02/12/2023   CVA (cerebral vascular accident) (HCC) 08/07/2022   History of CAD (coronary artery disease) of artery bypass graft 08/07/2022   Arthritis of right hip 08/06/2021   Unilateral primary osteoarthritis, right knee 08/06/2021   Closed compression fracture of body of L1 vertebra (HCC) 07/05/2021   Peripheral arterial disease (HCC) 03/17/2021   Onychomycosis 02/28/2021   Right leg pain 07/26/2020   Onychodystrophy 07/26/2020   CKD (chronic kidney disease), stage IV (HCC) 07/19/2020   Hypertensive heart and renal disease 03/22/2020   Gout 12/07/2017   DM type 2 (diabetes mellitus, type 2) (HCC) 02/11/2014   History of coronary artery bypass graft 07/01/2010   MYOCARDIAL INFARCTION, INFERIOR WALL, SUBSEQUENT CARE 05/12/2008   CAD, NATIVE VESSEL 05/12/2008   Hyperlipidemia 05/11/2008   Essential hypertension 05/11/2008   CAD, UNSPECIFIED SITE 05/11/2008   CEREBROVASCULAR DISEASE 05/11/2008   Rheumatoid arthritis (HCC) 05/11/2008    Orientation RESPIRATION BLADDER Height & Weight     Self, Time, Situation, Place  Normal  Continent, External catheter (currently with purewick) Weight: 125 lb 14.1 oz (57.1 kg) Height:  4\' 11"  (149.9 cm)  BEHAVIORAL SYMPTOMS/MOOD NEUROLOGICAL BOWEL NUTRITION STATUS      Continent Diet (regular)  AMBULATORY STATUS COMMUNICATION OF NEEDS Skin   Extensive Assist Verbally Other (Comment) (Buttocks Location Orientation: Right;Left Staging: Stage 2 - Partial thickness loss of dermis presenting as a shallow open injury with a red, pink wound bed)                       Personal Care Assistance Level of Assistance  Bathing, Dressing Bathing Assistance: Limited assistance   Dressing Assistance: Limited assistance     Functional Limitations Info  Sight, Hearing, Speech Sight Info: Adequate Hearing Info: Adequate Speech Info: Adequate    SPECIAL CARE FACTORS FREQUENCY  PT (By licensed PT), OT (By licensed OT)     PT Frequency: 5x/wk OT Frequency: 5x/wk            Contractures Contractures Info: Not present    Additional Factors Info  Code Status, Allergies Code Status Info: DNR Allergies Info: Cyclobenzaprine; Tramadol           Current Medications (05/17/2023):  This is the current hospital active medication list Current Facility-Administered Medications  Medication Dose Route Frequency Provider Last Rate Last Admin   carvedilol (COREG) tablet 6.25 mg  6.25 mg Oral BID Earlie Lou L, MD   6.25 mg at 05/17/23 1055   cefTRIAXone (ROCEPHIN) 1 g in sodium chloride 0.9 % 100 mL IVPB  1 g Intravenous Daily Willeen Niece, MD 200 mL/hr at 05/17/23 1116 1 g  at 05/17/23 1116   clopidogrel (PLAVIX) tablet 75 mg  75 mg Oral Daily Krugh, Marissa C, DO   75 mg at 05/17/23 1054   insulin aspart (novoLOG) injection 0-15 Units  0-15 Units Subcutaneous TID WC Rometta Emery, MD   3 Units at 05/17/23 1319   insulin aspart (novoLOG) injection 0-5 Units  0-5 Units Subcutaneous QHS Mikeal Hawthorne, Mohammad L, MD       morphine (PF) 2 MG/ML injection 2 mg  2 mg Intravenous Q2H PRN  Rometta Emery, MD   2 mg at 05/17/23 1320   ondansetron (ZOFRAN) tablet 4 mg  4 mg Oral Q6H PRN Rometta Emery, MD       Or   ondansetron (ZOFRAN) injection 4 mg  4 mg Intravenous Q6H PRN Rometta Emery, MD       pantoprazole (PROTONIX) EC tablet 40 mg  40 mg Oral Daily Earlie Lou L, MD   40 mg at 05/17/23 1055   polyethylene glycol (MIRALAX / GLYCOLAX) packet 17 g  17 g Oral Daily Willeen Niece, MD   17 g at 05/17/23 1054   rosuvastatin (CRESTOR) tablet 10 mg  10 mg Oral QPM Earlie Lou L, MD   10 mg at 05/16/23 1701   traMADol (ULTRAM) tablet 50 mg  50 mg Oral BID PRN Rometta Emery, MD   50 mg at 05/16/23 1052     Discharge Medications: Please see discharge summary for a list of discharge medications.  Relevant Imaging Results:  Relevant Lab Results:   Additional Information SSN: [id number]  Amada Jupiter, LCSW

## 2023-05-17 NOTE — TOC Progression Note (Signed)
 Transition of Care [facility]) - Progression Note    Patient Details  Name: [name] MRN: [id number] Date of Birth: 1927-08-23  [facility] CM/SW Contact  Amada Jupiter, LCSW Phone Number: 05/17/2023, 3:49 PM  Clinical Narrative:     Met with pt today to review therapy recommendations for SNF rehab.  She is agreeable with plan and has not facility preferences (except does NOT want to return to [facility]).  Will begin bed search.  Expected Discharge Plan: Home/Self Care Barriers to Discharge: Continued Medical Work up  Expected Discharge Plan and Services In-house Referral: NA Discharge Planning Services: CM Consult Post Acute Care Choice: NA Living arrangements for the past 2 months: Single Family Home                 DME Arranged: N/A DME Agency: NA       HH Arranged: NA HH Agency: NA         Social Determinants of Health (SDOH) Interventions SDOH Screenings   Food Insecurity: No Food Insecurity (05/13/2023)  Housing: Low Risk  (05/13/2023)  Transportation Needs: No Transportation Needs (05/13/2023)  Utilities: Not At Risk (05/13/2023)  Alcohol Screen: Low Risk  (11/14/2020)  Depression (PHQ2-9): Low Risk  (03/27/2022)  Financial Resource Strain: Low Risk  (03/27/2022)  Physical Activity: Inactive (03/27/2022)  Social Connections: Moderately Integrated (05/13/2023)  Stress: No Stress Concern Present (03/27/2022)  Tobacco Use: Low Risk  (05/13/2023)    Readmission Risk Interventions    05/14/2023    4:08 PM  Readmission Risk Prevention Plan  Transportation Screening Complete  Medication Review (RN Care Manager) Referral to Pharmacy  PCP or Specialist appointment within 3-5 days of discharge Complete  HRI or Home Care Consult Complete  SW Recovery Care/Counseling Consult Complete  Palliative Care Screening Not Applicable  Skilled Nursing Facility Not Applicable

## 2023-05-18 ENCOUNTER — Encounter (HOSPITAL_COMMUNITY)

## 2023-05-18 DIAGNOSIS — I871 Compression of vein: Secondary | ICD-10-CM | POA: Diagnosis not present

## 2023-05-18 LAB — GLUCOSE, CAPILLARY
Glucose-Capillary: 107 mg/dL — ABNORMAL HIGH (ref 70–99)
Glucose-Capillary: 175 mg/dL — ABNORMAL HIGH (ref 70–99)
Glucose-Capillary: 104 mg/dL — ABNORMAL HIGH (ref 70–99)
Glucose-Capillary: 140 mg/dL — ABNORMAL HIGH (ref 70–99)

## 2023-05-18 MED ORDER — AMLODIPINE BESYLATE 5 MG PO TABS
5.0000 mg | ORAL_TABLET | Freq: Every day | ORAL | Status: DC
  Administered 2023-05-18 – 2023-05-19 (×2): 5 mg via ORAL
  Filled 2023-05-18 (×2): qty 1

## 2023-05-18 NOTE — Progress Notes (Signed)
 PROGRESS NOTE    [name]  BJY:[id number] DOB: 1928-01-09 DOA: 05/12/2023 PCP: Nona Dell, NP   Brief Narrative: This 89 y/o F admitted for breast pain and swelling, non-traumatic subpectoral hematoma on CT and diffuse edema without loculated abscess on breast US.   She is also noted to have  subacute anterior subluxation/near dislocation of the glenohumeral joint for which she is following with Orthopedic Surgery outpatient. Patient has no recollection of trauma. She was admitted for further evaluation.  Assessment & Plan:   Principal Problem:   SVC syndrome Active Problems:   DM type 2 (diabetes mellitus, type 2) (HCC)   Essential hypertension   Hyperlipidemia   CKD (chronic kidney disease), stage IV (HCC)   Anemia of chronic disease   CAD, UNSPECIFIED SITE   Rheumatoid arthritis (HCC)   History of coronary artery bypass graft   Peripheral arterial disease (HCC)   Subpectoral hematoma / Mastitis : Patient presented with chronic shoulder dislocation, no recollection of trauma. Seems to be stable /improving  General surgery recommended compression and monitoring. No indication for evacuation at this time.   Not meeting SIRS criteria. No loculated abscess seen on breast US, but will empirically treat for mastitis. Monitor for development of abscess. Very low suspicion for SVC syndrome at this time. Hold asa, ok to continue Plavix. Continue supportive care, warm compresses.  Continue ceftriaxone for mastitis.  Treat for 7 days antibiotics.   Acute on Chronic anemia: H/H remains stable.   Subacute anterior subluxation  Near dislocation of the glenohumeral joint  OA R hip Pt reports months of shoulder pain with no history of trauma. Pending R hip replacement in April   She may need reverse shoulder arthorplasty for definitive management when medically stable. Continue shoulder immobilizer.  Orthopedic Surgery input appreciated  She lives at home  alone but has severe limitations between her hip and shoulder.   PT/OT evaluation for dispo recommendations    Diabetes Mellitus type II Well controlled. Hold jardiance given low GFR  SSI while in house.   CAD s/p CABG Hx CVA  Stable.  Continue plavix, d/c asa and DAPT not indicated. Resume statin. Holding jardiance given below  Cont beta blocker    HTN: Continue coreg.  Resume amlodipine and hydrochlorothiazide.  CKD 4-5 Creatinine near baseline, but will hold thiazide and jardaince given slight bump in Cr today and GFR <30 Cont monitor, avoid nephrotoxic agents       DVT prophylaxis: SCDs Code Status: DNR Family Communication: No family at bedside. Disposition Plan:   Status is: Inpatient Remains inpatient appropriate because:  Admitted for subpectoral hematoma,  mastitis, She is medically clear,  awaiting SNF placement.   Consultants:  None  Procedures: None  Antimicrobials:  Anti-infectives (From admission, onward)    Start     Dose/Rate Route Frequency Ordered Stop   05/15/23 1400  cefTRIAXone (ROCEPHIN) 1 g in sodium chloride 0.9 % 100 mL IVPB        1 g 200 mL/hr over 30 Minutes Intravenous Daily 05/15/23 1313     05/15/23 1200  vancomycin (VANCOREADY) IVPB 500 mg/100 mL  Status:  Discontinued        500 mg 100 mL/hr over 60 Minutes Intravenous Every 48 hours 05/13/23 1042 05/13/23 1133   05/13/23 1100  cefTRIAXone (ROCEPHIN) 1 g in sodium chloride 0.9 % 100 mL IVPB  Status:  Discontinued        1 g 200 mL/hr over 30 Minutes Intravenous Every  24 hours 05/13/23 1024 05/13/23 1133   05/13/23 1045  vancomycin (VANCOCIN) IVPB 1000 mg/200 mL premix  Status:  Discontinued        1,000 mg 200 mL/hr over 60 Minutes Intravenous  Once 05/13/23 1030 05/13/23 1133       Subjective: Patient was seen and examined at bedside.  Overnight events noted. Patient reports feeling much improved.  Pain is reasonably controlled.  Mastitis  / redness has  resolved.   Objective: Vitals:   05/18/23 0505 05/18/23 0847 05/18/23 0923 05/18/23 1020  BP: (!) 162/54 (!) 180/69 (!) 171/43 (!) 170/50  Pulse: 65 67 61 60  Resp: 18     Temp: 98.2 F (36.8 C)     TempSrc: Oral     SpO2: 99%     Weight:      Height:        Intake/Output Summary (Last 24 hours) at 05/18/2023 1219 Last data filed at 05/18/2023 1121 Gross per 24 hour  Intake 340 ml  Output 1550 ml  Net -1210 ml   Filed Weights   05/13/23 1445  Weight: 57.1 kg    Examination:  General exam: Appears calm , comfortable, deconditioned, not in any acute distress. Respiratory system: CTA Bilaterally. Respiratory effort normal.  RR 15 Chest wall: Right breast no erythema but swelling resolved,  no nipple discharge or overlying skin changes, Cardiovascular system: S1 & S2 heard, RRR. No JVD, murmurs, rubs, gallops or clicks. No pedal edema. Gastrointestinal system: Abdomen is nondistended, soft and nontender.  Normal bowel sounds heard. Central nervous system: Alert and oriented X 3. No focal neurological deficits. Extremities: No edema, no cyanosis, no clubbing. Skin: No rashes, lesions or ulcers Psychiatry: Judgement and insight appear normal. Mood & affect appropriate.     Data Reviewed: I have personally reviewed following labs and imaging studies  CBC: Recent Labs  Lab 05/12/23 1354 05/12/23 2302 05/13/23 1055 05/13/23 1504 05/14/23 0546 05/15/23 0507 05/17/23 0751  WBC 7.7   < > 5.7 6.3 5.9 5.5 5.4  NEUTROABS 5.8  --   --   --   --   --   --   HGB 9.3*   < > 8.0* 7.9* 7.9* 7.6* 8.2*  HCT 29.8*   < > 25.8* 25.5* 25.2* 23.9* 27.1*  MCV 99.7   < > 101.6* 102.0* 102.0* 99.6 103.8*  PLT 313   < > 262 258 231 207 208   < > = values in this interval not displayed.   Basic Metabolic Panel: Recent Labs  Lab 05/13/23 1055 05/13/23 1504 05/14/23 0546 05/15/23 0507 05/17/23 0751  NA 139 138 139 137 136  K 3.8 4.1 4.1 4.2 4.7  CL 107 108 107 107 105  CO2 25 22  25 22 22   GLUCOSE 214* 149* 105* 97 102*  BUN 58* 59* 59* 67* 64*  CREATININE 2.21* 2.09* 2.35* 2.71* 2.17*  CALCIUM 8.4* 8.2* 8.4* 8.3* 8.6*  MG  --   --   --   --  2.1  PHOS  --   --   --   --  4.7*   GFR: Estimated Creatinine Clearance: 12.5 mL/min (A) (by C-G formula based on SCr of 2.17 mg/dL (H)). Liver Function Tests: Recent Labs  Lab 05/13/23 1055  AST 19  ALT 13  ALKPHOS 64  BILITOT 0.5  PROT 5.5*  ALBUMIN 2.7*   No results for input(s): "LIPASE", "AMYLASE" in the last 168 hours. No results for input(s): "AMMONIA" in  the last 168 hours. Coagulation Profile: No results for input(s): "INR", "PROTIME" in the last 168 hours. Cardiac Enzymes: No results for input(s): "CKTOTAL", "CKMB", "CKMBINDEX", "TROPONINI" in the last 168 hours. BNP (last 3 results) No results for input(s): "PROBNP" in the last 8760 hours. HbA1C: No results for input(s): "HGBA1C" in the last 72 hours. CBG: Recent Labs  Lab 05/17/23 1135 05/17/23 1619 05/17/23 2210 05/18/23 0741 05/18/23 1118  GLUCAP 171* 117* 121* 107* 175*   Lipid Profile: No results for input(s): "CHOL", "HDL", "LDLCALC", "TRIG", "CHOLHDL", "LDLDIRECT" in the last 72 hours. Thyroid Function Tests: No results for input(s): "TSH", "T4TOTAL", "FREET4", "T3FREE", "THYROIDAB" in the last 72 hours. Anemia Panel: No results for input(s): "VITAMINB12", "FOLATE", "FERRITIN", "TIBC", "IRON", "RETICCTPCT" in the last 72 hours. Sepsis Labs: No results for input(s): "PROCALCITON", "LATICACIDVEN" in the last 168 hours.  No results found for this or any previous visit (from the past 240 hours).   Radiology Studies: No results found.  Scheduled Meds:  amLODipine  5 mg Oral Daily   carvedilol  6.25 mg Oral BID   clopidogrel  75 mg Oral Daily   insulin aspart  0-15 Units Subcutaneous TID WC   insulin aspart  0-5 Units Subcutaneous QHS   pantoprazole  40 mg Oral Daily   polyethylene glycol  17 g Oral Daily   rosuvastatin  10 mg  Oral QPM   Continuous Infusions:  cefTRIAXone (ROCEPHIN)  IV 1 g (05/18/23 1015)     LOS: 6 days    Time spent: 35 mins    Willeen Niece, MD [facility]   If 7PM-7AM, please contact night-coverage

## 2023-05-18 NOTE — Progress Notes (Signed)
 Pts blood pressure 180/69 MAP 99 @0847  this morning.   Idelle Leech, MD notified that carvedilol was given early.  BP 171/43 MAP 78 @0923  after carvedilol was given.  MD Idelle Leech notified @0926 , response @1004  of no new orders.    Amlodipine ordered @ 1218 Amlodipine given @1229  Pts blood pressure 154/52 MAP 81 @1303 . MD Notified

## 2023-05-18 NOTE — Plan of Care (Signed)

## 2023-05-18 NOTE — Progress Notes (Signed)
 Occupational Therapy Treatment Patient Details Name: [name] MRN: [id number] DOB: [birth year] Today's Date: 05/18/2023   History of present illness [name] is a 89 y.o. female admitted with R sub pectoral hematoma and R sub acute anterior shoulder subluxation now with R shoulder immobilization. Will f/u outpatient ortho for R shoulder. PMH: anemia, OA R shoulder and hip, DM2, CVA, HTN, and CKD.   OT comments  Patient seen for skilled OT session this afternoon. Patient was in bed upon OT arrival and open to all therapy presented including functional transfers from bed to commode and recliner and simple BADL's EOB and recliner level. Patient was able to perform SPT with + 2 mod assist with RW this session and R sling in place for proximal stability and support. Patient continues to require skilled Acute hospital level OT services. Patient will benefit from continued inpatient follow up therapy, <3 hours/day.        If plan is discharge home, recommend the following:  Two people to help with walking and/or transfers;A lot of help with bathing/dressing/bathroom;Assistance with cooking/housework;Direct supervision/assist for medications management;Direct supervision/assist for financial management;Assist for transportation;Help with stairs or ramp for entrance   Equipment Recommendations  None recommended by OT       Precautions / Restrictions Precautions Precautions: Fall Restrictions Weight Bearing Restrictions Per Provider Order: No Other Position/Activity Restrictions: R UE sling       Mobility Bed Mobility Overal bed mobility: Needs Assistance Bed Mobility: Rolling, Supine to Sit Rolling: Min assist   Supine to sit: Mod assist, HOB elevated, Used rails          Transfers Overall transfer level: Needs assistance Equipment used: Rolling walker (2 wheels) Transfers: Sit to/from Stand, Bed to chair/wheelchair/BSC Sit to Stand: +2 physical assistance,  Mod assist Stand pivot transfers: Mod assist, +2 physical assistance         General transfer comment: see above for [facility] transfers     Balance Overall balance assessment: Needs assistance Sitting-balance support: Feet supported Sitting balance-[name]: Fair     Standing balance support: Bilateral upper extremity supported, During functional activity, Reliant on assistive device for balance Standing balance-[name]: Poor Standing balance comment: reliant on external support                           ADL either performed or assessed with clinical judgement   ADL Overall ADL's : Needs assistance/impaired Eating/Feeding: Set up;Sitting   Grooming: Wash/dry hands;Wash/dry face;Oral care;Contact guard assist;Sitting (recliner level with B UE's supported with pillow prop and R UE in sling)                   Toilet Transfer: +2 for physical assistance;+2 for safety/equipment;Moderate assistance;Rolling walker (2 wheels);Stand-pivot   Toileting- Clothing Manipulation and Hygiene: Maximal assistance       Functional mobility during ADLs: +2 for physical assistance;+2 for safety/equipment;Moderate assistance;Rolling walker (2 wheels) (improved ability to perform SPT with RW this session with increased motor planning and decreased pain interference) General ADL Comments: used distal R UE with improved assistgiven proximal support    Extremity/Trunk Assessment Upper Extremity Assessment LUE Coordination: decreased fine motor;decreased gross motor (mild improvement in distal hand use on R for light ADL's this session)   Lower Extremity Assessment Lower Extremity Assessment: Generalized weakness                 Communication Communication Communication: No apparent difficulties   Cognition Arousal:  Alert Behavior During Therapy: [facility] for tasks assessed/performed                                 Following commands: Intact        Cueing    Cueing Techniques: Verbal cues        General Comments improved overall tolerance    Pertinent Vitals/ Pain       Pain Assessment Pain Assessment: Faces Faces Pain Scale: Hurts a little bit Pain Location: R side back and shoulder Pain Descriptors / Indicators: Aching, Discomfort Pain Intervention(s): Monitored during session, Repositioned, Heat applied   Frequency  Min 2X/week        Progress Toward Goals  OT Goals(current goals can now be found in the care plan section)  Progress towards OT goals: Progressing toward goals  Acute Rehab OT Goals Patient Stated Goal: to be able to do more for myself OT Goal Formulation: With patient Time For Goal Achievement: 05/29/23 Potential to Achieve Goals: Good ADL Goals Pt Will Perform Upper Body Bathing: with contact guard assist;sitting Pt Will Perform Upper Body Dressing: with contact guard assist;sitting Pt Will Transfer to Toilet: with min assist;bedside commode;stand pivot transfer  Plan         AM-PAC OT "6 Clicks" Daily Activity     Outcome Measure   Help from another person eating meals?: A Little Help from another person taking care of personal grooming?: A Little Help from another person toileting, which includes using toliet, bedpan, or urinal?: A Lot Help from another person bathing (including washing, rinsing, drying)?: A Lot Help from another person to put on and taking off regular upper body clothing?: A Lot Help from another person to put on and taking off regular lower body clothing?: A Lot 6 Click Score: 14    End of Session Equipment Utilized During Treatment: Gait belt;Rolling walker (2 wheels)  OT Visit Diagnosis: Unsteadiness on feet (R26.81);History of falling (Z91.81);Other abnormalities of gait and mobility (R26.89);Muscle weakness (generalized) (M62.81);Pain Pain - Right/Left: Right Pain - part of body: Shoulder   Activity Tolerance Patient limited by fatigue;Patient limited by pain    Patient Left in chair;with chair alarm set;with SCD's reapplied   Nurse Communication Mobility status;Precautions        Time: 1610-9604 OT Time Calculation (min): 28 min  Charges: OT General Charges $OT Visit: 1 Visit OT Treatments $Self Care/Home Management : 23-37 mins  Danikah Budzik OT/L Acute Rehabilitation Department  [phone]  05/18/2023, 4:26 PM

## 2023-05-18 NOTE — TOC Progression Note (Addendum)
 Transition of Care [facility]) - Progression Note    Patient Details  Name: [name] MRN: [id number] Date of Birth: [birth year]  [facility] CM/SW Contact  Beckie Busing, RN Phone [phone]  05/18/2023, 3:15 PM  Clinical Narrative:    CM at bedside to present bed offers. Patient would like [facility]. Patient has given CM consent to call son to update him.   1528 CM attempted to call son but it goes straight to voicemail. Message has been left.   [address] auth pending Auth ID [id number] CM spoke with [facility] admissions coordinator for [facility] to inquire if facility can accept patient . Per Lawerance Cruel she is in the process of checking to see if patient is in copay days.     Expected Discharge Plan: Home/Self Care Barriers to Discharge: Continued Medical Work up  Expected Discharge Plan and Services In-house Referral: NA Discharge Planning Services: CM Consult Post Acute Care Choice: NA Living arrangements for the past 2 months: Single Family Home                 DME Arranged: N/A DME Agency: NA       HH Arranged: NA HH Agency: NA         Social Determinants of Health (SDOH) Interventions SDOH Screenings   Food Insecurity: No Food Insecurity (05/13/2023)  Housing: Low Risk  (05/13/2023)  Transportation Needs: No Transportation Needs (05/13/2023)  Utilities: Not At Risk (05/13/2023)  Alcohol Screen: Low Risk  (11/14/2020)  Depression (PHQ2-9): Low Risk  (03/27/2022)  Financial Resource Strain: Low Risk  (03/27/2022)  Physical Activity: Inactive (03/27/2022)  Social Connections: Moderately Integrated (05/13/2023)  Stress: No Stress Concern Present (03/27/2022)  Tobacco Use: Low Risk  (05/13/2023)    Readmission Risk Interventions    05/14/2023    4:08 PM  Readmission Risk Prevention Plan  Transportation Screening Complete  Medication Review (RN Care Manager) Referral to Pharmacy  PCP or Specialist appointment within 3-5 days of discharge Complete   HRI or Home Care Consult Complete  SW Recovery Care/Counseling Consult Complete  Palliative Care Screening Not Applicable  Skilled Nursing Facility Not Applicable

## 2023-05-19 DIAGNOSIS — I871 Compression of vein: Secondary | ICD-10-CM | POA: Diagnosis not present

## 2023-05-19 LAB — GLUCOSE, CAPILLARY
Glucose-Capillary: 98 mg/dL (ref 70–99)
Glucose-Capillary: 140 mg/dL — ABNORMAL HIGH (ref 70–99)

## 2023-05-19 MED ORDER — TRAMADOL HCL 50 MG PO TABS: 50.0000 mg | ORAL_TABLET | Freq: Two times a day (BID) | ORAL | 0 refills | Status: AC | PRN

## 2023-05-19 NOTE — Progress Notes (Signed)
 Physical Therapy Treatment Patient Details Name: [name] MRN: [id number] DOB: 1927/12/10 Today's Date: 05/19/2023   History of Present Illness [name] is a 89 y.o. female admitted with R sub pectoral hematoma and R sub acute anterior shoulder subluxation now with R shoulder immobilization. Will f/u outpatient ortho for R shoulder. PMH: anemia, OA R shoulder and hip, DM2, CVA, HTN, and CKD.    PT Comments  PT - Cognition Comments: AxO x 3 pleasant and willing.  Sweet Lady. Assisted OOB was difficult.  General bed mobility comments: pt required MAX Assist to transfer to EOB using bed pad to complete "swival" and "scooting" to EOB with VC's to avoid using R UE that is in a sling. General transfer comment: pt was unable to perform a traditional sit to stand, unable to clear hips off bed.  Perofrmed "Bear Hug" SPS 1/4 turn from elevated bed to [facility].  Audible crepitus from her hips.  Assisted with peri care then another SPS from [facility] to recliner.  Then required + 2 Max Assist to scoot to back of recliner. General Gait Details: Unable to attempt amb this session due to poor transfer ability and inability to stand upright. Positioned in recliner to comfort.   Pt will need ST Rehab at SNF to address mobility and functional decline prior to safely returning home.    If plan is discharge home, recommend the following: Help with stairs or ramp for entrance;Assist for transportation;Assistance with cooking/housework;A lot of help with bathing/dressing/bathroom;A lot of help with walking and/or transfers   Can travel by private vehicle     No  Equipment Recommendations  None recommended by PT    Recommendations for Other Services       Precautions / Restrictions Precautions Precautions: Fall Restrictions Weight Bearing Restrictions Per Provider Order: No Other Position/Activity Restrictions: R UE sling     Mobility  Bed Mobility Overal bed mobility: Needs Assistance Bed  Mobility: Supine to Sit     Supine to sit: Max assist     General bed mobility comments: pt required MAX Assist to transfer to EOB using bed pad to complete "swival" and "scooting" to EOB with VC's to avoid using R UE that is in a sling.    Transfers Overall transfer level: Needs assistance Equipment used: None Transfers: Bed to chair/wheelchair/BSC Sit to Stand: +2 physical assistance, Max assist, Total assist           General transfer comment: pt was unable to perform a traditional sit to stand, unable to clear hips off bed.  Perofrmed "Bear Hug" SPS 1/4 turn from elevated bed to [facility].  Audible crepitus from her hips.  Assisted with peri care then another SPS from [facility] to recliner.  Then required + 2 Max Assist to scoot to back of recliner.    Ambulation/Gait               General Gait Details: Unable to attempt amb this session due to poor transfer ability and inability to stand upright.   Stairs             Wheelchair Mobility     Tilt Bed    Modified Rankin (Stroke Patients Only)       Balance                                            Communication  Communication Communication: No apparent difficulties  Cognition Arousal: Alert Behavior During Therapy: WFL for tasks assessed/performed   PT - Cognitive impairments: No apparent impairments                       PT - Cognition Comments: AxO x 3 pleasant and willing.  Sweet Lady. Following commands: Intact      Cueing Cueing Techniques: Verbal cues  Exercises      General Comments        Pertinent Vitals/Pain Pain Assessment Pain Assessment: No/denies pain    Home Living                          Prior Function            PT Goals (current goals can now be found in the care plan section) Progress towards PT goals: Progressing toward goals    Frequency    Min 2X/week      PT Plan      Co-evaluation              AM-PAC PT "6  Clicks" Mobility   Outcome Measure  Help needed turning from your back to your side while in a flat bed without using bedrails?: A Lot Help needed moving from lying on your back to sitting on the side of a flat bed without using bedrails?: A Lot Help needed moving to and from a bed to a chair (including a wheelchair)?: A Lot Help needed standing up from a chair using your arms (e.g., wheelchair or bedside chair)?: A Lot Help needed to walk in hospital room?: Total Help needed climbing 3-5 steps with a railing? : Total 6 Click Score: 10    End of Session Equipment Utilized During Treatment: Gait belt Activity Tolerance: Patient limited by fatigue Patient left: in chair;with call bell/phone within reach;with chair alarm set;with family/visitor present Nurse Communication: Mobility status PT Visit Diagnosis: Unsteadiness on feet (R26.81);Muscle weakness (generalized) (M62.81);Difficulty in walking, not elsewhere classified (R26.2) Pain - Right/Left: Right Pain - part of body: Shoulder     Time: 4098-1191 PT Time Calculation (min) (ACUTE ONLY): 12 min  Charges:    $Therapeutic Activity: 8-22 mins PT General Charges $$ ACUTE PT VISIT: 1 Visit                    Felecia Shelling  PTA Acute  Rehabilitation Services Office M-F          [phone]

## 2023-05-19 NOTE — Discharge Summary (Signed)
 Physician Discharge Summary  [name] BMW:[id number] DOB: 08/20/1927 DOA: 05/12/2023  PCP: Nona Dell, NP  Admit date: 05/12/2023  Discharge date: 05/19/2023  Admitted From: Home.  Disposition:  SNF  Recommendations for Outpatient Follow-up:  Follow up with PCP in 1-2 weeks. Please obtain BMP/CBC in one week. Advised to follow-up with orthopedics as an outpatient for chronic right shoulder dislocation. Patient has completed antibiotics for mastitis.  Home Health:None Equipment / Devices:None  Discharge Condition: Stable CODE STATUS: DNR Diet recommendation: Heart Healthy   Brief Summary/ Hospital Course: This 89 y/o F admitted for breast pain and swelling, non-traumatic subpectoral hematoma on CT and diffuse edema without loculated abscess on breast US. She is also noted to have subacute anterior subluxation/near dislocation of the glenohumeral joint for which she is following with Orthopedic Surgery outpatient. Patient has no recollection of trauma. She was admitted for further evaluation.  Patient was continued on IV antibiotics.  Mastitis has significantly improved.  There is no loculated infection.  There is very low suspicion for SVC syndrome at this time.  Continue supportive care and warm compresses.  Patient was evaluated by orthopedics,  recommended to continue shoulder immobilizer.  And outpatient follow-up. PT and OT recommended skilled nursing facility.  Patient being discharged to SNF.   Discharge Diagnoses:  Principal Problem:   SVC syndrome Active Problems:   DM type 2 (diabetes mellitus, type 2) (HCC)   Essential hypertension   Hyperlipidemia   CKD (chronic kidney disease), stage IV (HCC)   Anemia of chronic disease   CAD, UNSPECIFIED SITE   Rheumatoid arthritis (HCC)   History of coronary artery bypass graft   Peripheral arterial disease (HCC)  Subpectoral hematoma / Mastitis : Patient presented with chronic shoulder dislocation, no  recollection of trauma. Seems to be stable /improving  General surgery recommended compression and monitoring. No indication for evacuation at this time.   Not meeting SIRS criteria. No loculated abscess seen on breast US, but will empirically treat for mastitis. Monitor for development of abscess. Very low suspicion for SVC syndrome at this time. Hold asa, ok to continue Plavix. Continue supportive care, warm compresses.  Continue ceftriaxone for mastitis.  Treat for 7 days antibiotics.   Acute on Chronic anemia: H/H remains stable.   Subacute anterior subluxation  Near dislocation of the glenohumeral joint  OA R hip Pt reports months of shoulder pain with no history of trauma. Pending R hip replacement in April   She may need reverse shoulder arthorplasty for definitive management when medically stable. Continue shoulder immobilizer.  Orthopedic Surgery input appreciated  She lives at home alone but has severe limitations between her hip and shoulder.   PT/OT evaluation for dispo recommendations    Diabetes Mellitus type II Well controlled. Continue jardiance SSI while in house.   CAD s/p CABG Hx CVA  Stable.  Continue plavix, d/c asa and DAPT not indicated. Resume statin. Cont beta blocker    HTN: Continue coreg.  Resume amlodipine and hydrochlorothiazide.   CKD 4-5 Creatinine near baseline, but will hold thiazide and jardaince given slight bump in Cr today and GFR <30 Cont monitor, avoid nephrotoxic agents      Discharge Instructions  Discharge Instructions     Call MD for:  difficulty breathing, headache or visual disturbances   Complete by: As directed    Call MD for:  persistant dizziness or light-headedness   Complete by: As directed    Call MD for:  persistant nausea and vomiting  Complete by: As directed    Diet - low sodium heart healthy   Complete by: As directed    Diet Carb Modified   Complete by: As directed    Discharge instructions    Complete by: As directed    Advised to follow-up with primary care physician in 1 week. Advised to follow-up with orthopedics as an outpatient for chronic right shoulder dislocation. Patient has completed antibiotics for mastitis.   Increase activity slowly   Complete by: As directed    No wound care   Complete by: As directed       Allergies as of 05/19/2023       Reactions   Cyclobenzaprine Other (See Comments)   Reaction not recalled by patient   Tramadol Other (See Comments)   Hallucinations        Medication List     STOP taking these medications    amoxicillin-clavulanate 500-125 MG tablet Commonly known as: AUGMENTIN   aspirin EC 81 MG tablet   methylPREDNISolone 4 MG Tbpk tablet Commonly known as: MEDROL DOSEPAK       TAKE these medications    Accu-Chek Aviva Plus w/Device Kit Use to check  Blood sugars up to 3 times a day.  Dx code e11.65   Accu-Chek Aviva Soln Use with accu-check aviva machine   Accu-Chek Softclix Lancets lancets Use as instructed to check blood sugars up to 3 times a day  Dx code e11.65   acetaminophen 325 MG tablet Commonly known as: TYLENOL Take 2 tablets (650 mg total) by mouth every 6 (six) hours as needed for mild pain (pain score 1-3), moderate pain (pain score 4-6), fever or headache (or temp > 37.5 C (99.5 F)).   amLODipine 10 MG tablet Commonly known as: NORVASC Take 1 tablet (10 mg total) by mouth daily.   carvedilol 6.25 MG tablet Commonly known as: Coreg Take 1 tablet (6.25 mg total) by mouth 2 (two) times daily.   chlorthalidone 25 MG tablet Commonly known as: HYGROTON Take 12.5 mg by mouth daily.   clopidogrel 75 MG tablet Commonly known as: PLAVIX Take 1 tablet (75 mg total) by mouth daily.   fluticasone 50 MCG/ACT nasal spray Commonly known as: FLONASE Place 2 sprays into both nostrils See admin instructions. Instill 2 sprays into both nostrils in the morning as needed for allergies or rhinitis    FreeStyle Libre 2 Sensor Misc Inject 1 Device into the skin every 14 (fourteen) days.   glucose blood test strip Use as instructed to check blood sugars up to 3 times a day.  Dx code e11.65   hydroxychloroquine 200 MG tablet Commonly known as: PLAQUENIL Take 200 mg by mouth daily.   Jardiance 10 MG Tabs tablet Generic drug: empagliflozin Take 10 mg by mouth daily.   loratadine 10 MG tablet Commonly known as: CLARITIN Take 10 mg by mouth daily as needed for allergies or rhinitis.   melatonin 3 MG Tabs tablet Take 3 mg by mouth at bedtime.   methocarbamol 500 MG tablet Commonly known as: ROBAXIN Take 500 mg by mouth at bedtime.   nitroGLYCERIN 0.4 MG SL tablet Commonly known as: NITROSTAT PLACE 1 TABLET UNDER THE TONGUE AT THE FIRST SIGN OF ATTACK, MAY REPEAT EVERY 5 MINUTES FOR 3 DOSES IN 15 MINUTES. IF PAIN PERSISTS CALL 911 What changed:  how much to take how to take this when to take this additional instructions   NON FORMULARY Take 1 tablet by mouth See admin instructions.  Century Mature 0.4 mg-300 mcg-250 mcg tablet- Take 1 tablet by mouth in the morning with breakfast   pantoprazole 40 MG tablet Commonly known as: PROTONIX Take 1 tablet (40 mg total) by mouth daily. What changed:  when to take this reasons to take this   polyethylene glycol 17 g packet Commonly known as: MIRALAX / GLYCOLAX Take 17 g by mouth daily as needed for mild constipation.   rosuvastatin 10 MG tablet Commonly known as: CRESTOR Take 1 tablet (10 mg total) by mouth daily. What changed: when to take this   traMADol 50 MG tablet Commonly known as: ULTRAM Take 1 tablet (50 mg total) by mouth 2 (two) times daily as needed for up to 3 days (for pain).   Vitamin D3 50 MCG (2000 UT) Tabs Take 2,000 Units by mouth daily.        Follow-up Information     Schmerge, Belenda Cruise, NP Follow up in 1 week(s).   Specialty: Nurse Practitioner Contact information: [address] [place] [address] [phone]         [facility] Follow up in 1 week(s).                 Allergies  Allergen Reactions   Cyclobenzaprine Other (See Comments)    Reaction not recalled by patient   Tramadol Other (See Comments)    Hallucinations     Consultations: General surgery Orthopedics   Procedures/Studies: DG Knee Right Port Result Date: 05/13/2023 CLINICAL DATA:  Right knee pain. EXAM: PORTABLE RIGHT KNEE - 1-2 VIEW COMPARISON:  None Available. FINDINGS: No acute fracture or dislocation. No aggressive osseous lesion. There are degenerative changes of the knee joint in the form of moderately reduced tibiofemoral compartment joint space, asymmetrically involving medial compartment along with tibial spiking and tricompartmental osteophytosis. There also changes of chondromalacia patellae. Bilateral meniscal chondrocalcinosis noted. No knee effusion or focal soft tissue swelling. No radiopaque foreign bodies. IMPRESSION: No acute osseous abnormality of the right knee joint. Moderate tricompartmental degenerative joint disease. Electronically Signed   By: Jules Schick M.D.   On: 05/13/2023 10:24   CT Shoulder Right Wo Contrast Result Date: 05/12/2023 CLINICAL DATA:  R shoulder dislocation EXAM: CT OF THE UPPER RIGHT EXTREMITY WITHOUT CONTRAST TECHNIQUE: Multidetector CT imaging of the upper right extremity was performed according to the standard protocol. RADIATION DOSE REDUCTION: This exam was performed according to the departmental dose-optimization program which includes automated exposure control, adjustment of the mA and/or kV according to patient size and/or use of iterative reconstruction technique. COMPARISON:  X-ray right shoulder 05/12/2023, CT chest 05/12/2023, x-ray right shoulder 05/03/2023 FINDINGS: Bones/Joint/Cartilage Similar-appearing anterior shoulder dislocation with impaction of the humeral head on the anterior glenoid. Associated  joint effusion. Severe degenerative changes of the shoulder. Acute minimally displaced humeral head fracture (2:39). Chronic Hill-Sachs deformity. Benign appearing lucent lesion of the humeral head (6:61). Ligaments Suboptimally assessed by CT. Muscles and Tendons Grossly unremarkable. Soft tissues Subcutaneus soft tissue edema. IMPRESSION: 1. Anterior shoulder dislocation with impaction of the humeral head on the anterior glenoid. Associated joint effusion. 2. Acute minimally displaced humeral head fracture. Electronically Signed   By: Tish Frederickson M.D.   On: 05/12/2023 22:23   DG Shoulder Right Result Date: 05/12/2023 CLINICAL DATA:  Possible shoulder dislocation on CT EXAM: RIGHT SHOULDER - 2+ VIEW COMPARISON:  05/12/2023 radiograph and CT FINDINGS: Mild AC joint degenerative change. Advanced glenohumeral degenerative change. Humeral head appears slightly subluxed on the AP view  but there is no anterior or posterior displacement with respect to the glenoid on the Y-view. IMPRESSION: Advanced glenohumeral degenerative change. Suggestion of subluxation on AP view shoulder but not confirmed on Y-view, question joint instability related to history of rheumatoid. There is no fracture identified Electronically Signed   By: Jasmine Pang M.D.   On: 05/12/2023 21:17   VAS [place] LOWER EXTREMITY VENOUS (DVT) (ONLY [facility] & [facility]) Result Date: 05/12/2023  Lower Venous DVT Study Patient Name:  [name]  Date of Exam:   05/12/2023 Medical Rec #: [id number]                  Accession #:    [id number] Date of Birth: [birth year]                  Patient Gender: F Patient Age:   60 years Exam Location:  [facility] Procedure:      VAS [place] LOWER EXTREMITY VENOUS (DVT) Referring Phys: Molly Maduro PATERSON --------------------------------------------------------------------------------  Indications: Swelling.  Limitations: Calcific shadowing, patient unable to position properly. Comparison Study: No previous exams  Performing Technologist: Jody Hill RVT, RDMS  Examination Guidelines: A complete evaluation includes B-mode imaging, spectral Doppler, color Doppler, and power Doppler as needed of all accessible portions of each vessel. Bilateral testing is considered an integral part of a complete examination. Limited examinations for reoccurring indications may be performed as noted. The reflux portion of the exam is performed with the patient in reverse Trendelenburg.  +--------+---------------+---------+-----------+----------+--------------------+ RIGHT   CompressibilityPhasicitySpontaneityPropertiesThrombus Aging       +--------+---------------+---------+-----------+----------+--------------------+ CFV     Full           No       Yes                                       +--------+---------------+---------+-----------+----------+--------------------+ SFJ     Full                                                              +--------+---------------+---------+-----------+----------+--------------------+ FV Prox Full           Yes      Yes                                       +--------+---------------+---------+-----------+----------+--------------------+ FV Mid                                               Not well visualized  +--------+---------------+---------+-----------+----------+--------------------+ FV                                                   Not well visualized  Distal                                                                    +--------+---------------+---------+-----------+----------+--------------------+  PFV                    No       Yes                  patent by                                                                 color/doppler        +--------+---------------+---------+-----------+----------+--------------------+ POP     Full           Yes      Yes                                        +--------+---------------+---------+-----------+----------+--------------------+ PTV     Full                                                              +--------+---------------+---------+-----------+----------+--------------------+ PERO                                                 Not well visualized  +--------+---------------+---------+-----------+----------+--------------------+ Limited visualization of FV due to extensive calcific shadowing from overlying artery. Patient also unable to lay leg flat, positioning suboptimal. Visualized portions appear patent. PTV & Peroneals not well visualized due to overlying edema.  +----+---------------+---------+-----------+----------+--------------+ LEFTCompressibilityPhasicitySpontaneityPropertiesThrombus Aging +----+---------------+---------+-----------+----------+--------------+ CFV Full           Yes      Yes                                 +----+---------------+---------+-----------+----------+--------------+     Summary: RIGHT: - There is no evidence of deep vein thrombosis in the lower extremity. However, portions of this examination were limited- see technologist comments above.  - No cystic structure found in the popliteal fossa. Extensive subcutaneous edema of calf and ankle.  LEFT: - No evidence of common femoral vein obstruction.   *See table(s) above for measurements and observations. Electronically signed by Carolynn Sayers on 05/12/2023 at 8:23:49 PM.    Final    UE Venous Duplex ([facility] ONLY) Result Date: 05/12/2023 UPPER VENOUS STUDY  Patient Name:  [name]  Date of Exam:   05/12/2023 Medical Rec #: [id number]                  Accession #:    [id number] Date of Birth: 05/24/1927                  Patient Gender: F Patient Age:   11 years Exam Location:  [facility] Procedure:      VAS [place] UPPER EXTREMITY VENOUS DUPLEX Referring Phys: Molly Maduro PATERSON  --------------------------------------------------------------------------------  Indications: Pain, and Swelling Risk Factors: Recently diagnosed with mastitis of  right breast. Comparison Study: No previous exams Performing Technologist: Jody Hill RVT, RDMS  Examination Guidelines: A complete evaluation includes B-mode imaging, spectral Doppler, color Doppler, and power Doppler as needed of all accessible portions of each vessel. Bilateral testing is considered an integral part of a complete examination. Limited examinations for reoccurring indications may be performed as noted.  Right Findings: +----------+------------+---------+-----------+----------+--------------------+ RIGHT     CompressiblePhasicitySpontaneousProperties      Summary        +----------+------------+---------+-----------+----------+--------------------+ IJV           Full       Yes       Yes                                   +----------+------------+---------+-----------+----------+--------------------+ Subclavian               Yes       Yes                   patent by                                                              color/doppler     +----------+------------+---------+-----------+----------+--------------------+ Axillary      Full       Yes       Yes                                   +----------+------------+---------+-----------+----------+--------------------+ Brachial      Full       Yes       Yes                                   +----------+------------+---------+-----------+----------+--------------------+ Radial        Full                                                       +----------+------------+---------+-----------+----------+--------------------+ Ulnar         Full                                                       +----------+------------+---------+-----------+----------+--------------------+ Cephalic      Full                                                        +----------+------------+---------+-----------+----------+--------------------+ Basilic       Full                                                       +----------+------------+---------+-----------+----------+--------------------+  Left Findings: +----------+------------+---------+-----------+----------+-------+ LEFT      CompressiblePhasicitySpontaneousPropertiesSummary +----------+------------+---------+-----------+----------+-------+ Subclavian    Full       Yes       Yes                      +----------+------------+---------+-----------+----------+-------+  Summary:  Right: No evidence of deep vein thrombosis in the upper extremity. No evidence of superficial vein thrombosis in the upper extremity. Extensive subcutaneous edema of upper extremity.  Left: No evidence of thrombosis in the subclavian.  *See table(s) above for measurements and observations.  Diagnosing physician: Carolynn Sayers Electronically signed by Carolynn Sayers on 05/12/2023 at 8:23:09 PM.    Final    CT CHEST ABDOMEN PELVIS WO CONTRAST Result Date: 05/12/2023 CLINICAL DATA:  Right-sided swelling to the face, arm, leg, and breast. Right breast swelling began on Thursday. Diagnosed with mastitis on antibiotics. EXAM: CT CHEST, ABDOMEN AND PELVIS WITHOUT CONTRAST TECHNIQUE: Multidetector CT imaging of the chest, abdomen and pelvis was performed following the standard protocol without IV contrast. RADIATION DOSE REDUCTION: This exam was performed according to the departmental dose-optimization program which includes automated exposure control, adjustment of the mA and/or kV according to patient size and/or use of iterative reconstruction technique. COMPARISON:  Chest radiograph 05/12/2023. CT abdomen and pelvis 03/07/2023 FINDINGS: CT CHEST FINDINGS Cardiovascular: Normal heart size. No pericardial effusions. Calcification in the mitral valve annulus and aorta. Postoperative changes consistent with coronary  bypass. Normal caliber thoracic aorta. Mediastinum/Nodes: Thyroid gland is unremarkable. Esophagus is decompressed. No significant lymphadenopathy. Extensive soft tissue infiltration and edema demonstrated throughout the right shoulder, right breast, and right chest wall extending into the abdomen. Right subpectoral collection measuring 3.1 x 7.5 cm, possibly hematoma. Lungs/Pleura: Mild dependent atelectasis versus fibrosis in the lung bases. No airspace disease or consolidation. No pleural effusion or pneumothorax. Musculoskeletal: Anterior dislocation of the right shoulder. Degenerative changes in the right shoulder. Large right shoulder effusion. Degenerative changes in the left shoulder with mild anterior subluxation. No discrete dislocation. Moderate left shoulder effusion. Sternotomy wires are present. Degenerative changes throughout the spine. No acute displaced rib fractures. CT ABDOMEN PELVIS FINDINGS Hepatobiliary: Cholelithiasis with stones layering in the gallbladder. No inflammatory changes. No bile duct dilatation. No focal liver lesions. Pancreas: Unremarkable. No pancreatic ductal dilatation or surrounding inflammatory changes. Spleen: Normal in size without focal abnormality. Adrenals/Urinary Tract: Adrenal glands are unremarkable. Kidneys are normal, without renal calculi, focal solid lesion, or hydronephrosis. Bladder is unremarkable. Stomach/Bowel: Stomach, small bowel, and colon are not abnormally distended. Stool throughout the colon. No wall thickening or inflammatory changes. Sigmoid colonic diverticulosis without evidence of acute diverticulitis. Appendix is not identified. Vascular/Lymphatic: Aortic atherosclerosis. No enlarged abdominal or pelvic lymph nodes. Reproductive: Uterus and bilateral adnexa are unremarkable. Other: No free air or free fluid in the abdomen. Abdominal wall musculature appears intact. Diffuse soft tissue edema throughout the subcutaneous fat bilaterally.  Musculoskeletal: Degenerative changes throughout the lumbar spine. Compression of the inferior endplate of L1 without change since previous study. Slight anterior subluxation of L3 on L4 is also unchanged. Severe degenerative changes in the right hip with bone on bone appearance. Subcortical cysts. Mild femoral head deformity. Less prominent degenerative changes in the left hip. IMPRESSION: 1. Diffuse soft tissue edema involving the right shoulder, right breast, and right chest wall. No mass or lymphadenopathy identified in the mediastinum or supraclavicular regions. 2. Subpectoral hematoma on the right. 3. Anterior dislocation of the right shoulder with large right shoulder effusion. Moderate left  shoulder effusion. Degenerative changes in both shoulders in the spine. 4. No evidence of active pulmonary disease. 5. No evidence of active pulmonary disease. 6. Aortic atherosclerosis. 7. Cholelithiasis without evidence of acute cholecystitis. 8. No evidence of bowel obstruction or inflammation. 9. Degenerative changes in the hips. Electronically Signed   By: Burman Nieves M.D.   On: 05/12/2023 20:09   [place] CHEST SOFT TISSUE Result Date: 05/12/2023 CLINICAL DATA:  Right breast abscess EXAM: ULTRASOUND OF THE right BREAST COMPARISON:  None available. FINDINGS: Targeted ultrasound is performed, showing diffuse soft tissue edema in the visualized right breast. No loculated collection. Normal homogeneous flow signal on color flow Doppler imaging. IMPRESSION: Soft tissue edema in the right breast. No evidence of loculated abscess. RECOMMENDATION: This examination represents emergent targeted ultrasound of a focal abnormality suggested clinically. Physical examination was not performed. Recommend referral to breast center for more complete workup of any area of clinical concern. BI-RADS CATEGORY  0: Incomplete: Need additional imaging evaluation. Electronically Signed   By: Burman Nieves M.D.   On: 05/12/2023 19:14    DG Shoulder Right Result Date: 05/12/2023 CLINICAL DATA:  Right upper extremity swelling. EXAM: RIGHT SHOULDER - 2+ VIEW COMPARISON:  None Available. FINDINGS: There is no evidence of fracture or dislocation. Severe degenerative changes seen involving the right glenohumeral joint. Moderate degenerative changes seen involving the right acromioclavicular joint. Soft tissues are unremarkable. IMPRESSION: Moderate to severe degenerative changes as noted above. No acute abnormality seen. Electronically Signed   By: Lupita Raider M.D.   On: 05/12/2023 18:51   DG Chest 2 View Result Date: 05/12/2023 CLINICAL DATA:  Right breast swelling. EXAM: CHEST - 2 VIEW COMPARISON:  February 12, 2023. FINDINGS: The heart size and mediastinal contours are within normal limits. Status post coronary artery bypass graft. Both lungs are clear. The visualized skeletal structures are unremarkable. IMPRESSION: No active cardiopulmonary disease. Electronically Signed   By: Lupita Raider M.D.   On: 05/12/2023 18:50   CT Cervical Spine Wo Contrast Result Date: 05/03/2023 CLINICAL DATA:  Blunt poly trauma.  History of rheumatoid arthritis EXAM: CT CERVICAL SPINE WITHOUT CONTRAST TECHNIQUE: Multidetector CT imaging of the cervical spine was performed without intravenous contrast. Multiplanar CT image reconstructions were also generated. RADIATION DOSE REDUCTION: This exam was performed according to the departmental dose-optimization program which includes automated exposure control, adjustment of the mA and/or kV according to patient size and/or use of iterative reconstruction technique. COMPARISON:  Cervical MRI 02/12/2023 FINDINGS: Alignment: No traumatic malalignment. Chronic degenerative anterolisthesis at C3-4, C4-5, and C7-T1. Skull base and vertebrae: Generalized osteopenia. No acute fracture or incidental bone lesion. Soft tissues and spinal canal: No prevertebral fluid or swelling. No visible canal hematoma. Disc levels:  Advanced and generalized cervical spine degeneration affecting disc spaces and facets. Ankylosis at C3-4 and C5-6. retro dental ligamentous thickening and calcification the does not appear compressive. Upper chest: Clear apical lungs. IMPRESSION: No acute finding. Advanced and generalized cervical spine degeneration. Electronically Signed   By: Tiburcio Pea M.D.   On: 05/03/2023 07:48   DG Elbow Complete Right Result Date: 05/03/2023 CLINICAL DATA:  Pain and swelling.  History of RA. EXAM: RIGHT ELBOW - COMPLETE 3+ VIEW COMPARISON:  None Available. FINDINGS: The lateral projection radiograph is suboptimal due to patient positioning. There are no signs of acute fracture or dislocation. No focal bone erosions identified. No significant arthropathy. Mild diffuse soft tissue edema. IMPRESSION: 1. No acute bone abnormality. 2. Mild diffuse soft tissue edema.  Electronically Signed   By: Signa Kell M.D.   On: 05/03/2023 07:34   DG Shoulder Right Result Date: 05/03/2023 CLINICAL DATA:  Pain.  History of rheumatoid arthritis. EXAM: RIGHT SHOULDER - 2+ VIEW COMPARISON:  02/09/2013. FINDINGS: No dislocation or acute fracture. Glenohumeral and acromioclavicular joint osteoarthritis. Soft tissues are unremarkable. IMPRESSION: 1. No acute findings. 2. Glenohumeral and acromioclavicular joint osteoarthritis.  The Electronically Signed   By: Signa Kell M.D.   On: 05/03/2023 07:30   DG Hand 2 View Right Result Date: 05/03/2023 CLINICAL DATA:  Hand swelling for 3 days. History of rheumatoid arthritis. EXAM: RIGHT HAND - 2 VIEW COMPARISON:  None Available. FINDINGS: There is diffuse soft tissue edema. Bones appear osteopenic. No signs of acute fracture or dislocation. Slight ulnar deviation of the fingers. There is uniform joint space narrowing involving the second metacarpal phalangeal joint. Advanced osteoarthritis noted involving the basilar joint. Mild degenerative changes are noted involving the first, second  and third DIP joints. Radiocarpal joint space narrowing is noted along with chondrocalcinosis. No focal bone erosions or cystic changes identified. IMPRESSION: 1. Diffuse soft tissue edema. 2. No acute bone abnormality. 3. Osteoarthritis. 4. Chondrocalcinosis. Electronically Signed   By: Signa Kell M.D.   On: 05/03/2023 06:46    Subjective: Patient was seen and examined at bedside.  Overnight events noted.   Patient reports doing much better , she wants to be discharged.  Discharge Exam: Vitals:   05/19/23 0559 05/19/23 1011  BP: (!) 137/48 (!) 142/40  Pulse: 61   Resp: 18   Temp: 98.2 F (36.8 C)   SpO2: 99%    Vitals:   05/18/23 2215 05/18/23 2348 05/19/23 0559 05/19/23 1011  BP: (!) 174/49 (!) 143/45 (!) 137/48 (!) 142/40  Pulse: 74 66 61   Resp: 18  18   Temp: 97.8 F (36.6 C)  98.2 F (36.8 C)   TempSrc: Oral  Oral   SpO2: 99% 98% 99%   Weight:      Height:        General: Pt is alert, awake, not in acute distress Cardiovascular: RRR, S1/S2 +, no rubs, no gallops Respiratory: CTA bilaterally, no wheezing, no rhonchi Abdominal: Soft, NT, ND, bowel sounds + Extremities: no edema, no cyanosis    The results of significant diagnostics from this hospitalization (including imaging, microbiology, ancillary and laboratory) are listed below for reference.     Microbiology: No results found for this or any previous visit (from the past 240 hours).   Labs: BNP (last 3 results) No results for input(s): "BNP" in the last 8760 hours. Basic Metabolic Panel: Recent Labs  Lab 05/13/23 1055 05/13/23 1504 05/14/23 0546 05/15/23 0507 05/17/23 0751  NA 139 138 139 137 136  K 3.8 4.1 4.1 4.2 4.7  CL 107 108 107 107 105  CO2 25 22 25 22 22   GLUCOSE 214* 149* 105* 97 102*  BUN 58* 59* 59* 67* 64*  CREATININE 2.21* 2.09* 2.35* 2.71* 2.17*  CALCIUM 8.4* 8.2* 8.4* 8.3* 8.6*  MG  --   --   --   --  2.1  PHOS  --   --   --   --  4.7*   Liver Function Tests: Recent Labs   Lab 05/13/23 1055  AST 19  ALT 13  ALKPHOS 64  BILITOT 0.5  PROT 5.5*  ALBUMIN 2.7*   No results for input(s): "LIPASE", "AMYLASE" in the last 168 hours. No results for input(s): "AMMONIA" in the  last 168 hours. CBC: Recent Labs  Lab 05/12/23 1354 05/12/23 2302 05/13/23 1055 05/13/23 1504 05/14/23 0546 05/15/23 0507 05/17/23 0751  WBC 7.7   < > 5.7 6.3 5.9 5.5 5.4  NEUTROABS 5.8  --   --   --   --   --   --   HGB 9.3*   < > 8.0* 7.9* 7.9* 7.6* 8.2*  HCT 29.8*   < > 25.8* 25.5* 25.2* 23.9* 27.1*  MCV 99.7   < > 101.6* 102.0* 102.0* 99.6 103.8*  PLT 313   < > 262 258 231 207 208   < > = values in this interval not displayed.   Cardiac Enzymes: No results for input(s): "CKTOTAL", "CKMB", "CKMBINDEX", "TROPONINI" in the last 168 hours. BNP: Invalid input(s): "POCBNP" CBG: Recent Labs  Lab 05/18/23 0741 05/18/23 1118 05/18/23 1638 05/18/23 2218 05/19/23 0731  GLUCAP 107* 175* 104* 140* 98   D-Dimer No results for input(s): "DDIMER" in the last 72 hours. Hgb A1c No results for input(s): "HGBA1C" in the last 72 hours. Lipid Profile No results for input(s): "CHOL", "HDL", "LDLCALC", "TRIG", "CHOLHDL", "LDLDIRECT" in the last 72 hours. Thyroid function studies No results for input(s): "TSH", "T4TOTAL", "T3FREE", "THYROIDAB" in the last 72 hours.  Invalid input(s): "FREET3" Anemia work up No results for input(s): "VITAMINB12", "FOLATE", "FERRITIN", "TIBC", "IRON", "RETICCTPCT" in the last 72 hours. Urinalysis    Component Value Date/Time   COLORURINE STRAW (A) 03/07/2023 1323   APPEARANCEUR HAZY (A) 03/07/2023 1323   LABSPEC 1.015 03/07/2023 1323   PHURINE 8.5 (H) 03/07/2023 1323   GLUCOSEU NEGATIVE 03/07/2023 1323   HGBUR TRACE (A) 03/07/2023 1323   BILIRUBINUR NEGATIVE 03/07/2023 1323   BILIRUBINUR negative 11/14/2020 1608   KETONESUR NEGATIVE 03/07/2023 1323   PROTEINUR NEGATIVE 03/07/2023 1323   UROBILINOGEN 0.2 11/14/2020 1608   UROBILINOGEN 0.2  04/26/2009 0014   NITRITE NEGATIVE 03/07/2023 1323   LEUKOCYTESUR SMALL (A) 03/07/2023 1323   Sepsis Labs Recent Labs  Lab 05/13/23 1504 05/14/23 0546 05/15/23 0507 05/17/23 0751  WBC 6.3 5.9 5.5 5.4   Microbiology No results found for this or any previous visit (from the past 240 hours).   Time coordinating discharge: Over 30 minutes  SIGNED:   Willeen Niece, MD  [facility] 05/19/2023, 11:11 AM Pager   If 7PM-7AM, please contact night-coverage

## 2023-05-19 NOTE — Discharge Instructions (Signed)
 Advised to follow-up with primary care physician in 1 week. Advised to follow-up with orthopedics as an outpatient for chronic right shoulder dislocation. Patient has completed antibiotics for mastitis.

## 2023-05-19 NOTE — Plan of Care (Signed)
  Problem: Fluid Volume: Goal: Ability to maintain a balanced intake and output will improve Outcome: Progressing   Problem: Metabolic: Goal: Ability to maintain appropriate glucose levels will improve Outcome: Progressing   Problem: Clinical Measurements: Goal: Diagnostic test results will improve Outcome: Progressing   Problem: Pain Managment: Goal: General experience of comfort will improve and/or be controlled Outcome: Progressing   Problem: Safety: Goal: Ability to remain free from injury will improve Outcome: Progressing

## 2023-05-19 NOTE — TOC Transition Note (Signed)
 Transition of Care [facility]) - Discharge Note   Patient Details  Name: [name] MRN: [id number] Date of Birth: February 20, 1927  [facility] CM/SW Contact:  Beckie Busing, RN Phone [phone]  05/19/2023, 1:53 PM   Clinical Narrative:    Patient discharging to [facility]. D/c summary and transfer report has been faxed. Transportation has been arranged per [facility]. Nurse has been sent message with info to call report. Patient gives CM consent to speak with son Adele Schilder. There is no answer cm has left voicemail requesting son to call CM. D/c packet is in chart at nurses station. No other TOC needs. TOC will sign off.    Final next level of care: Skilled Nursing Facility Barriers to Discharge: No Barriers Identified   Patient Goals and CMS Choice Patient states their goals for this hospitalization and ongoing recovery are:: Ready to get better to go home CMS Medicare.gov Compare Post Acute Care list provided to:: Patient   [place] ownership interest in [facility].provided to::  (n/a)    Discharge Placement              Patient chooses bed at: [facility] Patient to be transferred to facility by: [facility] Name of family member notified: Adele Schilder Babin no answer Phone goes straight to voicemail. Message left for son to retunr call for update. Patient and family notified of of transfer: 05/19/23  Discharge Plan and Services Additional resources added to the After Visit Summary for   In-house Referral: NA Discharge Planning Services: CM Consult Post Acute Care Choice: NA          DME Arranged: N/A DME Agency: NA       HH Arranged: NA HH Agency: NA        Social Drivers of Health (SDOH) Interventions SDOH Screenings   Food Insecurity: No Food Insecurity (05/13/2023)  Housing: Low Risk  (05/13/2023)  Transportation Needs: No Transportation Needs (05/13/2023)  Utilities: Not At Risk (05/13/2023)  Alcohol Screen: Low Risk  (11/14/2020)   Depression (PHQ2-9): Low Risk  (03/27/2022)  Financial Resource Strain: Low Risk  (03/27/2022)  Physical Activity: Inactive (03/27/2022)  Social Connections: Moderately Integrated (05/13/2023)  Stress: No Stress Concern Present (03/27/2022)  Tobacco Use: Low Risk  (05/13/2023)     Readmission Risk Interventions    05/14/2023    4:08 PM  Readmission Risk Prevention Plan  Transportation Screening Complete  Medication Review (RN Care Manager) Referral to Pharmacy  PCP or Specialist appointment within 3-5 days of discharge Complete  HRI or Home Care Consult Complete  SW Recovery Care/Counseling Consult Complete  Palliative Care Screening Not Applicable  Skilled Nursing Facility Not Applicable

## 2023-05-19 NOTE — Plan of Care (Signed)
 Gave report to Nurse [facility]. Patient understood discharge instructions, PIV intact upon removal, vital signs stable.  Transportation via [employer] to [employer]   Problem: Education: Goal: Ability to describe self-care measures that may prevent or decrease complications (Diabetes Financial trader) will improve Outcome: Adequate for Discharge Goal: Individualized Educational Video(s) Outcome: Adequate for Discharge   Problem: Coping: Goal: Ability to adjust to condition or change in health will improve Outcome: Adequate for Discharge   Problem: Fluid Volume: Goal: Ability to maintain a balanced intake and output will improve Outcome: Adequate for Discharge   Problem: Health Behavior/Discharge Planning: Goal: Ability to identify and utilize available resources and services will improve Outcome: Adequate for Discharge Goal: Ability to manage health-related needs will improve Outcome: Adequate for Discharge   Problem: Metabolic: Goal: Ability to maintain appropriate glucose levels will improve Outcome: Adequate for Discharge   Problem: Nutritional: Goal: Maintenance of adequate nutrition will improve Outcome: Adequate for Discharge Goal: Progress toward achieving an optimal weight will improve Outcome: Adequate for Discharge   Problem: Skin Integrity: Goal: Risk for impaired skin integrity will decrease Outcome: Adequate for Discharge   Problem: Tissue Perfusion: Goal: Adequacy of tissue perfusion will improve Outcome: Adequate for Discharge   Problem: Education: Goal: Knowledge of General Education information will improve Description: Including pain rating scale, medication(s)/side effects and non-pharmacologic comfort measures Outcome: Adequate for Discharge   Problem: Health Behavior/Discharge Planning: Goal: Ability to manage health-related needs will improve Outcome: Adequate for Discharge   Problem: Clinical Measurements: Goal: Ability to  maintain clinical measurements within normal limits will improve Outcome: Adequate for Discharge Goal: Will remain free from infection Outcome: Adequate for Discharge Goal: Diagnostic test results will improve Outcome: Adequate for Discharge Goal: Respiratory complications will improve Outcome: Adequate for Discharge Goal: Cardiovascular complication will be avoided Outcome: Adequate for Discharge   Problem: Activity: Goal: Risk for activity intolerance will decrease Outcome: Adequate for Discharge   Problem: Nutrition: Goal: Adequate nutrition will be maintained Outcome: Adequate for Discharge   Problem: Coping: Goal: Level of anxiety will decrease Outcome: Adequate for Discharge   Problem: Elimination: Goal: Will not experience complications related to bowel motility Outcome: Adequate for Discharge Goal: Will not experience complications related to urinary retention Outcome: Adequate for Discharge   Problem: Pain Managment: Goal: General experience of comfort will improve and/or be controlled Outcome: Adequate for Discharge   Problem: Safety: Goal: Ability to remain free from injury will improve Outcome: Adequate for Discharge   Problem: Skin Integrity: Goal: Risk for impaired skin integrity will decrease Outcome: Adequate for Discharge

## 2023-05-20 ENCOUNTER — Ambulatory Visit (HOSPITAL_COMMUNITY): Admit: 2023-05-20 | Admitting: Orthopedic Surgery

## 2023-05-20 SURGERY — ARTHROPLASTY, HIP, TOTAL, ANTERIOR APPROACH
Anesthesia: Spinal | Site: Hip | Laterality: Right

## 2023-08-03 ENCOUNTER — Encounter (HOSPITAL_COMMUNITY): Payer: Self-pay

## 2023-08-03 ENCOUNTER — Other Ambulatory Visit: Payer: Self-pay

## 2023-08-03 ENCOUNTER — Emergency Department (HOSPITAL_COMMUNITY)
Admission: EM | Admit: 2023-08-03 | Discharge: 2023-08-03 | Disposition: A | Discharge status: Home / Self Care | Attending: Emergency Medicine | Admitting: Emergency Medicine

## 2023-08-03 DIAGNOSIS — D649 Anemia, unspecified: Secondary | ICD-10-CM | POA: Insufficient documentation

## 2023-08-03 DIAGNOSIS — I130 Hypertensive heart and chronic kidney disease with heart failure and stage 1 through stage 4 chronic kidney disease, or unspecified chronic kidney disease: Secondary | ICD-10-CM | POA: Insufficient documentation

## 2023-08-03 DIAGNOSIS — G8929 Other chronic pain: Secondary | ICD-10-CM | POA: Diagnosis not present

## 2023-08-03 DIAGNOSIS — R531 Weakness: Secondary | ICD-10-CM | POA: Insufficient documentation

## 2023-08-03 DIAGNOSIS — Z79899 Other long term (current) drug therapy: Secondary | ICD-10-CM | POA: Insufficient documentation

## 2023-08-03 DIAGNOSIS — E1122 Type 2 diabetes mellitus with diabetic chronic kidney disease: Secondary | ICD-10-CM | POA: Diagnosis not present

## 2023-08-03 DIAGNOSIS — I2510 Atherosclerotic heart disease of native coronary artery without angina pectoris: Secondary | ICD-10-CM | POA: Insufficient documentation

## 2023-08-03 DIAGNOSIS — N189 Chronic kidney disease, unspecified: Secondary | ICD-10-CM | POA: Insufficient documentation

## 2023-08-03 DIAGNOSIS — I509 Heart failure, unspecified: Secondary | ICD-10-CM | POA: Insufficient documentation

## 2023-08-03 LAB — URINALYSIS, W/ REFLEX TO CULTURE (INFECTION SUSPECTED)
Bilirubin Urine: NEGATIVE
Glucose, UA: NEGATIVE mg/dL
Ketones, ur: NEGATIVE mg/dL
Leukocytes,Ua: NEGATIVE
Nitrite: NEGATIVE
Protein, ur: NEGATIVE mg/dL
Specific Gravity, Urine: 1.01 (ref 1.005–1.030)
pH: 6 (ref 5.0–8.0)

## 2023-08-03 LAB — COMPREHENSIVE METABOLIC PANEL WITH GFR
ALT: 10 U/L (ref 0–44)
AST: 21 U/L (ref 15–41)
Albumin: 3.2 g/dL — ABNORMAL LOW (ref 3.5–5.0)
Alkaline Phosphatase: 59 U/L (ref 38–126)
Anion gap: 12 (ref 5–15)
BUN: 61 mg/dL — ABNORMAL HIGH (ref 8–23)
CO2: 22 mmol/L (ref 22–32)
Calcium: 9.3 mg/dL (ref 8.9–10.3)
Chloride: 104 mmol/L (ref 98–111)
Creatinine, Ser: 2.72 mg/dL — ABNORMAL HIGH (ref 0.44–1.00)
GFR, Estimated: 16 mL/min — ABNORMAL LOW (ref >60)
Glucose, Bld: 88 mg/dL (ref 70–99)
Potassium: 4.5 mmol/L (ref 3.5–5.1)
Sodium: 138 mmol/L (ref 135–145)
Total Bilirubin: 0.7 mg/dL (ref 0.0–1.2)
Total Protein: 6.3 g/dL — ABNORMAL LOW (ref 6.5–8.1)

## 2023-08-03 LAB — CBC
HCT: 29.8 % — ABNORMAL LOW (ref 36.0–46.0)
Hemoglobin: 9.5 g/dL — ABNORMAL LOW (ref 12.0–15.0)
MCH: 30.6 pg (ref 26.0–34.0)
MCHC: 31.9 g/dL (ref 30.0–36.0)
MCV: 96.1 fL (ref 80.0–100.0)
Platelets: 259 10*3/uL (ref 150–400)
RBC: 3.1 MIL/uL — ABNORMAL LOW (ref 3.87–5.11)
RDW: 13.2 % (ref 11.5–15.5)
WBC: 4.9 10*3/uL (ref 4.0–10.5)
nRBC: 0 % (ref 0.0–0.2)

## 2023-08-03 LAB — CBG MONITORING, ED: Glucose-Capillary: 89 mg/dL (ref 70–99)

## 2023-08-03 MED ORDER — ACETAMINOPHEN 325 MG PO TABS
650.0000 mg | ORAL_TABLET | Freq: Once | ORAL | Status: AC
  Administered 2023-08-03: 650 mg via ORAL
  Filled 2023-08-03: qty 2

## 2023-08-03 MED ORDER — LIDOCAINE 5 % EX PTCH
1.0000 | MEDICATED_PATCH | CUTANEOUS | Status: DC
  Administered 2023-08-03: 1 via TRANSDERMAL
  Filled 2023-08-03: qty 1

## 2023-08-03 MED ORDER — OXYCODONE HCL 5 MG PO TABS
5.0000 mg | ORAL_TABLET | Freq: Once | ORAL | Status: AC
  Administered 2023-08-03: 5 mg via ORAL
  Filled 2023-08-03: qty 1

## 2023-08-03 NOTE — Discharge Instructions (Addendum)
 It was a pleasure caring for you today.  Please remember to stay plenty hydrated and follow-up with your primary care provider and your orthopedic provider.  Seek emergency care if experiencing any new or worsening symptoms.

## 2023-08-03 NOTE — ED Provider Notes (Addendum)
 [place] EMERGENCY DEPARTMENT AT [facility] Provider Note   CSN: [id number] Arrival date & time: 08/03/23  0705     Patient presents with: Weakness   [name] is a 89 y.o. female with PMHx CVA, CKD, CAD, DM, HTN, CHF, who presents to ED after welfare check. Over the past week, patient has been having difficulties getting out of bed d/t her chronic sciatica pain and not having anything to grab onto to get out of bed. Patient stating that she has been calling her son (who lives next door) to help her out, but she accidentally called her friend last night. The friend heard the voice message this morning and called [employer] for welfare check. Police assisted patient out of bed and called EMS for evaluation. EMS stating that [employer] helped patient off of the floor - patient denying this stating that she was just stuck in her bed and has not had any recent falls.   Last dose of Tylenol  around midnight for pain control. Son at bedside stating that patient also has chronic pain from her shoulders and has some narcotic pain medication at home for these pains.  Denies fever, chest pain, dyspnea, cough, nausea, vomiting, diarrhea, dysuria, hematuria. Denies urinary complaint or bowel incontinence.     Weakness      Prior to Admission medications   Medication Sig Start Date End Date Taking? Authorizing Provider  Accu-Chek Softclix Lancets lancets Use as instructed to check blood sugars up to 3 times a day  Dx code e11.65 05/17/19   Cleave Curling, MD  acetaminophen  (TYLENOL ) 325 MG tablet Take 2 tablets (650 mg total) by mouth every 6 (six) hours as needed for mild pain (pain score 1-3), moderate pain (pain score 4-6), fever or headache (or temp > 37.5 C (99.5 F)). 02/16/23   Elgergawy, Ardia Kraft, MD  amLODipine  (NORVASC ) 10 MG tablet Take 1 tablet (10 mg total) by mouth daily. 02/16/23 02/16/24  Elgergawy, Ardia Kraft, MD  Blood Glucose Calibration (ACCU-CHEK AVIVA) SOLN Use with  accu-check aviva machine 06/02/19   Cleave Curling, MD  Blood Glucose Monitoring Suppl (ACCU-CHEK AVIVA PLUS) w/Device KIT Use to check  Blood sugars up to 3 times a day.  Dx code e11.65 12/10/20   Cleave Curling, MD  carvedilol  (COREG ) 6.25 MG tablet Take 1 tablet (6.25 mg total) by mouth 2 (two) times daily. 02/16/23 02/16/24  Elgergawy, Ardia Kraft, MD  chlorthalidone  (HYGROTON ) 25 MG tablet Take 12.5 mg by mouth daily. 03/06/23   [provider]  Cholecalciferol (VITAMIN D3) 50 MCG (2000 UT) TABS Take 2,000 Units by mouth daily.    [provider]  clopidogrel  (PLAVIX ) 75 MG tablet Take 1 tablet (75 mg total) by mouth daily. 02/17/23   Elgergawy, Ardia Kraft, MD  Continuous Glucose Sensor (FREESTYLE LIBRE 2 SENSOR) MISC Inject 1 Device into the skin every 14 (fourteen) days.    [provider]  fluticasone  (FLONASE ) 50 MCG/ACT nasal spray Place 2 sprays into both nostrils See admin instructions. Instill 2 sprays into both nostrils in the morning as needed for allergies or rhinitis 06/03/22   [provider]  glucose blood test strip Use as instructed to check blood sugars up to 3 times a day.  Dx code e11.65 05/17/19   Cleave Curling, MD  hydroxychloroquine (PLAQUENIL) 200 MG tablet Take 200 mg by mouth daily. 03/31/23   [provider]  JARDIANCE  10 MG TABS tablet Take 10 mg by mouth daily. 03/06/23  [provider]  loratadine  (CLARITIN ) 10 MG tablet Take 10 mg by mouth daily as needed for allergies or rhinitis.    [provider]  melatonin 3 MG TABS tablet Take 3 mg by mouth at bedtime. 03/06/23   [provider]  methocarbamol (ROBAXIN) 500 MG tablet Take 500 mg by mouth at bedtime. 03/06/23   [provider]  nitroGLYCERIN  (NITROSTAT ) 0.4 MG SL tablet PLACE 1 TABLET UNDER THE TONGUE AT THE FIRST SIGN OF ATTACK, MAY REPEAT EVERY 5 MINUTES FOR 3 DOSES IN 15 MINUTES. IF PAIN PERSISTS CALL 911 Patient taking differently:  Place 0.4 mg under the tongue See admin instructions. PLACE 0.4 mg (1 TABLET) UNDER THE TONGUE AT THE FIRST SIGN OF ATTACK, MAY REPEAT EVERY 5 MINUTES FOR 3 DOSES IN 15 MINUTES. IF PAIN PERSISTS, CALL 911. 08/24/20   Cleave Curling, MD  NON FORMULARY Take 1 tablet by mouth See admin instructions. Century Mature 0.4 mg-300 mcg-250 mcg tablet- Take 1 tablet by mouth in the morning with breakfast    [provider]  pantoprazole  (PROTONIX ) 40 MG tablet Take 1 tablet (40 mg total) by mouth daily. Patient taking differently: Take 40 mg by mouth daily as needed. 02/17/23   Elgergawy, Ardia Kraft, MD  polyethylene glycol (MIRALAX  / GLYCOLAX ) 17 g packet Take 17 g by mouth daily as needed for mild constipation. 08/13/22   Danford, Willis Harter, MD  rosuvastatin  (CRESTOR ) 10 MG tablet Take 1 tablet (10 mg total) by mouth daily. Patient taking differently: Take 10 mg by mouth every evening. 02/17/23   Elgergawy, Ardia Kraft, MD    Allergies: Cyclobenzaprine  and Tramadol     Review of Systems  Neurological:  Positive for weakness.    Updated Vital Signs BP (!) 195/63   Pulse 71   Temp (!) 96.7 F (35.9 C) (Temporal)   Resp 17   Ht 4' 11 (1.499 m)   Wt 56.7 kg   SpO2 100%   BMI 25.25 kg/m   Physical Exam Vitals and nursing note reviewed.  Constitutional:      General: She is not in acute distress.    Appearance: She is not ill-appearing or toxic-appearing.  HENT:     Head: Normocephalic and atraumatic.     Mouth/Throat:     Mouth: Mucous membranes are moist.   Eyes:     General: No scleral icterus.       Right eye: No discharge.        Left eye: No discharge.     Conjunctiva/sclera: Conjunctivae normal.    Cardiovascular:     Rate and Rhythm: Normal rate and regular rhythm.     Pulses: Normal pulses.     Heart sounds: Normal heart sounds. No murmur heard. Pulmonary:     Effort: Pulmonary effort is normal. No respiratory distress.     Breath sounds: Normal breath sounds. No  wheezing, rhonchi or rales.  Abdominal:     General: Abdomen is flat.     Palpations: Abdomen is soft.   Musculoskeletal:     Right lower leg: No edema.     Left lower leg: No edema.     Comments: +2 pedal pulses BL. No swelling or erythema of BL LE.   Skin:    General: Skin is warm and dry.     Findings: No rash.   Neurological:     General: No focal deficit present.     Mental Status: She is alert and oriented to person, place,  and time. Mental status is at baseline.     Comments: GCS 15. Speech is goal oriented. No deficits appreciated to CN III-XII; symmetric eyebrow raise, no facial drooping, tongue midline. Patient has equal grip strength bilaterally with 5/5 strength against resistance in all major muscle groups bilaterally. Sensation to light touch intact. Patient moves extremities without ataxia.    Psychiatric:        Mood and Affect: Mood normal.        Behavior: Behavior normal.     (all labs ordered are listed, but only abnormal results are displayed) Labs Reviewed  COMPREHENSIVE METABOLIC PANEL WITH GFR - Abnormal; Notable for the following components:      Result Value   BUN 61 (*)    Creatinine, Ser 2.72 (*)    Total Protein 6.3 (*)    Albumin 3.2 (*)    GFR, Estimated 16 (*)    All other components within normal limits  CBC - Abnormal; Notable for the following components:   RBC 3.10 (*)    Hemoglobin 9.5 (*)    HCT 29.8 (*)    All other components within normal limits  URINALYSIS, W/ REFLEX TO CULTURE (INFECTION SUSPECTED) - Abnormal; Notable for the following components:   Hgb urine dipstick TRACE (*)    Bacteria, UA MANY (*)    All other components within normal limits  CBG MONITORING, ED    EKG: EKG Interpretation Date/Time:  Monday August 03 2023 07:28:46 EDT Ventricular Rate:  63 PR Interval:  171 QRS Duration:  113 QT Interval:  450 QTC Calculation: 461 R Axis:   28  Text Interpretation: Sinus rhythm Borderline intraventricular conduction  delay Nonspecific T abnormalities, diffuse leads Borderline ST elevation, anterolateral leads Nonspecific  ST changes. Confirmed by Mozell Arias [phone]) on 08/03/2023 9:16:16 AM  Radiology: No results found.   Procedures   Medications Ordered in the ED  lidocaine  (LIDODERM ) 5 % 1 patch (1 patch Transdermal Patch Applied 08/03/23 0735)  acetaminophen  (TYLENOL ) tablet 650 mg (650 mg Oral Given 08/03/23 0734)  oxyCODONE  (Oxy IR/ROXICODONE ) immediate release tablet 5 mg (5 mg Oral Given 08/03/23 1104)                                    Medical Decision Making Amount and/or Complexity of Data Reviewed Labs: ordered.  Risk OTC drugs. Prescription drug management.   This patient presents to the ED for concern of weakness, this involves an extensive number of treatment options, and is a complaint that carries with it a high risk of complications and morbidity.  The differential diagnosis includes Ischemic stroke, intracerebral hemorrhage, subarachnoid hemorrhage, Guillain-Barr syndrome, hypoglycemia, electrolyte abnormality, sepsis, ACS, carbon monoxide poisoning, anemia, dehydration.   Co morbidities that complicate the patient evaluation  CVA, CKD, CAD, DM, HTN, CHF,    Additional history obtained:  Dr. Adalberto Acton PCP   Problem List / ED Course / Critical interventions / Medication management  Patient presents to ED after welfare check. Patient recently having troubles getting out of bed because there is nothing sturdy for her to grab onto to help raise up out of bed. Patient also with chronic pain in BL shoulders and right sided sciatica which also makes getting out of bed difficult. Physical exam reassuring. Patient afebrile with stable vitals. I Ordered, and personally interpreted labs.  CBC without leukocytosis.  There is anemia near patient's baseline with hemoglobin at 9.5 today.  UA not concerning for infection.  CBG 89.  CMP with mild elevation in patient's CKD with BUN/CR  at 61/2.72 today. The patient was maintained on a cardiac monitor.  I personally viewed and interpreted the EKG/cardiac monitored which showed an underlying rhythm of: Sinus rhythm, nonspecific ST changes It appears that patient's difficulties today were d/t her chronic pain.  Patient denies any recent traumas or falls so imaging was not obtained for her chronic pain at this time.  Patient agrees stating that this pain has been chronic for the past 2 years.  Will provide patient with pain management while awaiting lab results in ED. I did talk with patient and her son at bedside.  Patient does have home health and a hospital bed in her house.  Patient seems to have good assistance at home.  I recommended following up with PCP and orthopedic providers.  Patient and son verbalized understanding of plan. Staffed with Dr. Val Garin. I have reviewed the patients home medicines and have made adjustments as needed The patient has been appropriately medically screened and/or stabilized in the ED. I have low suspicion for any other emergent medical condition which would require further screening, evaluation or treatment in the ED or require inpatient management. At time of discharge the patient is hemodynamically stable and in no acute distress. I have discussed work-up results and diagnosis with patient and answered all questions. Patient is agreeable with discharge plan. We discussed strict return precautions for returning to the emergency department and they verbalized understanding.     Social Determinants of Health:  geriatric      Final diagnoses:  Other chronic pain  Weakness    ED Discharge Orders     None           [place], PA-C 08/03/23 1200    Mozell Arias, MD 08/03/23 1506

## 2023-08-03 NOTE — ED Triage Notes (Signed)
 Arrives [facility] from home after welfare check initiated by friend.   Reported to paramedics by PD that pt was laying down on ground.   Pt alert, GCS- 15. Says she was not on the ground but that she has been weak over the past several days.

## 2023-08-06 ENCOUNTER — Other Ambulatory Visit: Payer: Self-pay

## 2023-08-06 ENCOUNTER — Inpatient Hospital Stay (HOSPITAL_COMMUNITY)
Admission: EM | Admit: 2023-08-06 | Discharge: 2023-08-12 | DRG: 682 | Disposition: A | Discharge status: Skilled Nursing Facility | Attending: Internal Medicine | Admitting: Internal Medicine

## 2023-08-06 ENCOUNTER — Emergency Department (HOSPITAL_COMMUNITY)

## 2023-08-06 ENCOUNTER — Encounter (HOSPITAL_COMMUNITY): Payer: Self-pay | Admitting: Emergency Medicine

## 2023-08-06 DIAGNOSIS — R110 Nausea: Secondary | ICD-10-CM | POA: Diagnosis present

## 2023-08-06 DIAGNOSIS — I10 Essential (primary) hypertension: Secondary | ICD-10-CM | POA: Diagnosis present

## 2023-08-06 DIAGNOSIS — R1084 Generalized abdominal pain: Principal | ICD-10-CM

## 2023-08-06 DIAGNOSIS — G8929 Other chronic pain: Secondary | ICD-10-CM | POA: Diagnosis present

## 2023-08-06 DIAGNOSIS — D638 Anemia in other chronic diseases classified elsewhere: Secondary | ICD-10-CM | POA: Diagnosis present

## 2023-08-06 DIAGNOSIS — Z7902 Long term (current) use of antithrombotics/antiplatelets: Secondary | ICD-10-CM

## 2023-08-06 DIAGNOSIS — N390 Urinary tract infection, site not specified: Secondary | ICD-10-CM | POA: Diagnosis present

## 2023-08-06 DIAGNOSIS — Z8673 Personal history of transient ischemic attack (TIA), and cerebral infarction without residual deficits: Secondary | ICD-10-CM | POA: Diagnosis present

## 2023-08-06 DIAGNOSIS — E875 Hyperkalemia: Secondary | ICD-10-CM | POA: Diagnosis not present

## 2023-08-06 DIAGNOSIS — N179 Acute kidney failure, unspecified: Principal | ICD-10-CM | POA: Diagnosis present

## 2023-08-06 DIAGNOSIS — R630 Anorexia: Secondary | ICD-10-CM | POA: Diagnosis present

## 2023-08-06 DIAGNOSIS — T402X5A Adverse effect of other opioids, initial encounter: Secondary | ICD-10-CM | POA: Diagnosis not present

## 2023-08-06 DIAGNOSIS — Z885 Allergy status to narcotic agent status: Secondary | ICD-10-CM

## 2023-08-06 DIAGNOSIS — E119 Type 2 diabetes mellitus without complications: Secondary | ICD-10-CM

## 2023-08-06 DIAGNOSIS — T8089XA Other complications following infusion, transfusion and therapeutic injection, initial encounter: Secondary | ICD-10-CM | POA: Diagnosis not present

## 2023-08-06 DIAGNOSIS — M069 Rheumatoid arthritis, unspecified: Secondary | ICD-10-CM | POA: Diagnosis present

## 2023-08-06 DIAGNOSIS — Z66 Do not resuscitate: Secondary | ICD-10-CM | POA: Diagnosis present

## 2023-08-06 DIAGNOSIS — E1169 Type 2 diabetes mellitus with other specified complication: Secondary | ICD-10-CM

## 2023-08-06 DIAGNOSIS — Z888 Allergy status to other drugs, medicaments and biological substances status: Secondary | ICD-10-CM

## 2023-08-06 DIAGNOSIS — R197 Diarrhea, unspecified: Secondary | ICD-10-CM | POA: Diagnosis present

## 2023-08-06 DIAGNOSIS — D631 Anemia in chronic kidney disease: Secondary | ICD-10-CM | POA: Diagnosis present

## 2023-08-06 DIAGNOSIS — M5430 Sciatica, unspecified side: Secondary | ICD-10-CM | POA: Diagnosis present

## 2023-08-06 DIAGNOSIS — E860 Dehydration: Principal | ICD-10-CM | POA: Diagnosis present

## 2023-08-06 DIAGNOSIS — N189 Chronic kidney disease, unspecified: Secondary | ICD-10-CM | POA: Diagnosis present

## 2023-08-06 DIAGNOSIS — R627 Adult failure to thrive: Secondary | ICD-10-CM | POA: Diagnosis present

## 2023-08-06 DIAGNOSIS — R109 Unspecified abdominal pain: Secondary | ICD-10-CM | POA: Diagnosis not present

## 2023-08-06 DIAGNOSIS — E785 Hyperlipidemia, unspecified: Secondary | ICD-10-CM | POA: Diagnosis present

## 2023-08-06 DIAGNOSIS — E1122 Type 2 diabetes mellitus with diabetic chronic kidney disease: Secondary | ICD-10-CM | POA: Diagnosis present

## 2023-08-06 DIAGNOSIS — I129 Hypertensive chronic kidney disease with stage 1 through stage 4 chronic kidney disease, or unspecified chronic kidney disease: Secondary | ICD-10-CM | POA: Diagnosis present

## 2023-08-06 DIAGNOSIS — T43025A Adverse effect of tetracyclic antidepressants, initial encounter: Secondary | ICD-10-CM | POA: Diagnosis not present

## 2023-08-06 DIAGNOSIS — Z6825 Body mass index (BMI) 25.0-25.9, adult: Secondary | ICD-10-CM

## 2023-08-06 DIAGNOSIS — Z8249 Family history of ischemic heart disease and other diseases of the circulatory system: Secondary | ICD-10-CM

## 2023-08-06 DIAGNOSIS — Z79899 Other long term (current) drug therapy: Secondary | ICD-10-CM

## 2023-08-06 DIAGNOSIS — I2510 Atherosclerotic heart disease of native coronary artery without angina pectoris: Secondary | ICD-10-CM | POA: Diagnosis present

## 2023-08-06 DIAGNOSIS — Y718 Miscellaneous cardiovascular devices associated with adverse incidents, not elsewhere classified: Secondary | ICD-10-CM | POA: Diagnosis not present

## 2023-08-06 DIAGNOSIS — G928 Other toxic encephalopathy: Secondary | ICD-10-CM | POA: Diagnosis not present

## 2023-08-06 DIAGNOSIS — I639 Cerebral infarction, unspecified: Secondary | ICD-10-CM | POA: Diagnosis present

## 2023-08-06 DIAGNOSIS — Z7984 Long term (current) use of oral hypoglycemic drugs: Secondary | ICD-10-CM

## 2023-08-06 DIAGNOSIS — N184 Chronic kidney disease, stage 4 (severe): Secondary | ICD-10-CM | POA: Diagnosis present

## 2023-08-06 DIAGNOSIS — I679 Cerebrovascular disease, unspecified: Secondary | ICD-10-CM | POA: Diagnosis present

## 2023-08-06 DIAGNOSIS — T428X5A Adverse effect of antiparkinsonism drugs and other central muscle-tone depressants, initial encounter: Secondary | ICD-10-CM | POA: Diagnosis not present

## 2023-08-06 LAB — CBC WITH DIFFERENTIAL/PLATELET
Abs Immature Granulocytes: 0 10*3/uL (ref 0.00–0.07)
Basophils Absolute: 0 10*3/uL (ref 0.0–0.1)
Basophils Relative: 0 %
Eosinophils Absolute: 0.1 10*3/uL (ref 0.0–0.5)
Eosinophils Relative: 2 %
HCT: 30.8 % — ABNORMAL LOW (ref 36.0–46.0)
Hemoglobin: 9.7 g/dL — ABNORMAL LOW (ref 12.0–15.0)
Immature Granulocytes: 0 %
Lymphocytes Relative: 11 %
Lymphs Abs: 0.5 10*3/uL — ABNORMAL LOW (ref 0.7–4.0)
MCH: 29.9 pg (ref 26.0–34.0)
MCHC: 31.5 g/dL (ref 30.0–36.0)
MCV: 95.1 fL (ref 80.0–100.0)
Monocytes Absolute: 0.5 10*3/uL (ref 0.1–1.0)
Monocytes Relative: 10 %
Neutro Abs: 3.7 10*3/uL (ref 1.7–7.7)
Neutrophils Relative %: 77 %
Platelets: 265 10*3/uL (ref 150–400)
RBC: 3.24 MIL/uL — ABNORMAL LOW (ref 3.87–5.11)
RDW: 13.4 % (ref 11.5–15.5)
WBC: 4.9 10*3/uL (ref 4.0–10.5)
nRBC: 0 % (ref 0.0–0.2)

## 2023-08-06 LAB — COMPREHENSIVE METABOLIC PANEL WITH GFR
ALT: 10 U/L (ref 0–44)
AST: 23 U/L (ref 15–41)
Albumin: 3.5 g/dL (ref 3.5–5.0)
Alkaline Phosphatase: 70 U/L (ref 38–126)
Anion gap: 7 (ref 5–15)
BUN: 68 mg/dL — ABNORMAL HIGH (ref 8–23)
CO2: 25 mmol/L (ref 22–32)
Calcium: 9.3 mg/dL (ref 8.9–10.3)
Chloride: 104 mmol/L (ref 98–111)
Creatinine, Ser: 2.97 mg/dL — ABNORMAL HIGH (ref 0.44–1.00)
GFR, Estimated: 14 mL/min — ABNORMAL LOW (ref >60)
Glucose, Bld: 117 mg/dL — ABNORMAL HIGH (ref 70–99)
Potassium: 4.9 mmol/L (ref 3.5–5.1)
Sodium: 136 mmol/L (ref 135–145)
Total Bilirubin: 0.6 mg/dL (ref 0.0–1.2)
Total Protein: 6.7 g/dL (ref 6.5–8.1)

## 2023-08-06 LAB — GLUCOSE, CAPILLARY
Glucose-Capillary: 95 mg/dL (ref 70–99)
Glucose-Capillary: 185 mg/dL — ABNORMAL HIGH (ref 70–99)

## 2023-08-06 LAB — LIPASE, BLOOD: Lipase: 38 U/L (ref 11–51)

## 2023-08-06 LAB — TROPONIN I (HIGH SENSITIVITY)
Troponin I (High Sensitivity): 31 ng/L — ABNORMAL HIGH (ref <18)
Troponin I (High Sensitivity): 30 ng/L — ABNORMAL HIGH (ref <18)

## 2023-08-06 MED ORDER — FLUTICASONE PROPIONATE 50 MCG/ACT NA SUSP: 1.0000 | Freq: Every day | NASAL | Status: DC | PRN

## 2023-08-06 MED ORDER — ROSUVASTATIN CALCIUM 10 MG PO TABS
10.0000 mg | ORAL_TABLET | Freq: Every evening | ORAL | Status: DC
  Administered 2023-08-06 – 2023-08-11 (×6): 10 mg via ORAL
  Filled 2023-08-06 (×6): qty 1

## 2023-08-06 MED ORDER — ONDANSETRON HCL 4 MG PO TABS: 4.0000 mg | ORAL_TABLET | Freq: Four times a day (QID) | ORAL | Status: DC | PRN

## 2023-08-06 MED ORDER — ORAL CARE MOUTH RINSE
15.0000 mL | OROMUCOSAL | Status: AC | PRN
Start: 2023-08-06 — End: ?

## 2023-08-06 MED ORDER — SODIUM CHLORIDE 0.9 % IV BOLUS
500.0000 mL | Freq: Once | INTRAVENOUS | Status: AC
  Administered 2023-08-06: 500 mL via INTRAVENOUS

## 2023-08-06 MED ORDER — INSULIN ASPART 100 UNIT/ML IJ SOLN
0.0000 [IU] | Freq: Three times a day (TID) | INTRAMUSCULAR | Status: DC
  Administered 2023-08-07 – 2023-08-09 (×4): 2 [IU] via SUBCUTANEOUS
  Administered 2023-08-09: 3 [IU] via SUBCUTANEOUS
  Administered 2023-08-11: 2 [IU] via SUBCUTANEOUS
  Filled 2023-08-06: qty 0.15

## 2023-08-06 MED ORDER — CLOPIDOGREL BISULFATE 75 MG PO TABS
75.0000 mg | ORAL_TABLET | Freq: Every day | ORAL | Status: DC
  Administered 2023-08-06 – 2023-08-12 (×6): 75 mg via ORAL
  Filled 2023-08-06 (×6): qty 1

## 2023-08-06 MED ORDER — ACETAMINOPHEN 325 MG PO TABS
650.0000 mg | ORAL_TABLET | Freq: Four times a day (QID) | ORAL | Status: DC | PRN
  Administered 2023-08-06 – 2023-08-12 (×5): 650 mg via ORAL
  Filled 2023-08-06 (×6): qty 2

## 2023-08-06 MED ORDER — ONDANSETRON HCL 4 MG/2ML IJ SOLN
4.0000 mg | Freq: Once | INTRAMUSCULAR | Status: AC
  Administered 2023-08-06: 4 mg via INTRAVENOUS
  Filled 2023-08-06: qty 2

## 2023-08-06 MED ORDER — ALBUTEROL SULFATE (2.5 MG/3ML) 0.083% IN NEBU: 2.5000 mg | INHALATION_SOLUTION | RESPIRATORY_TRACT | Status: DC | PRN

## 2023-08-06 MED ORDER — LORATADINE 10 MG PO TABS
10.0000 mg | ORAL_TABLET | Freq: Every day | ORAL | Status: DC | PRN
  Administered 2023-08-10: 10 mg via ORAL
  Filled 2023-08-06: qty 1

## 2023-08-06 MED ORDER — ONDANSETRON HCL 4 MG/2ML IJ SOLN
4.0000 mg | Freq: Four times a day (QID) | INTRAMUSCULAR | Status: DC | PRN
  Administered 2023-08-07 – 2023-08-10 (×2): 4 mg via INTRAVENOUS
  Filled 2023-08-06 (×2): qty 2

## 2023-08-06 MED ORDER — SODIUM CHLORIDE 0.9 % IV SOLN: INTRAVENOUS | Status: DC

## 2023-08-06 MED ORDER — INSULIN ASPART 100 UNIT/ML IJ SOLN
0.0000 [IU] | Freq: Every day | INTRAMUSCULAR | Status: DC
  Filled 2023-08-06: qty 0.05

## 2023-08-06 MED ORDER — ACETAMINOPHEN 650 MG RE SUPP: 650.0000 mg | Freq: Four times a day (QID) | RECTAL | Status: DC | PRN

## 2023-08-06 MED ORDER — POLYETHYLENE GLYCOL 3350 17 G PO PACK
17.0000 g | PACK | Freq: Every day | ORAL | Status: DC | PRN
  Administered 2023-08-07: 17 g via ORAL
  Filled 2023-08-06: qty 1

## 2023-08-06 MED ORDER — AMLODIPINE BESYLATE 10 MG PO TABS
10.0000 mg | ORAL_TABLET | Freq: Every day | ORAL | Status: DC
  Administered 2023-08-06 – 2023-08-12 (×6): 10 mg via ORAL
  Filled 2023-08-06 (×6): qty 1

## 2023-08-06 MED ORDER — CARVEDILOL 6.25 MG PO TABS
6.2500 mg | ORAL_TABLET | Freq: Two times a day (BID) | ORAL | Status: DC
  Administered 2023-08-06 – 2023-08-12 (×11): 6.25 mg via ORAL
  Filled 2023-08-06 (×11): qty 1

## 2023-08-06 MED ORDER — HEPARIN SODIUM (PORCINE) 5000 UNIT/ML IJ SOLN
5000.0000 [IU] | Freq: Three times a day (TID) | INTRAMUSCULAR | Status: DC
  Administered 2023-08-06 – 2023-08-12 (×17): 5000 [IU] via SUBCUTANEOUS
  Filled 2023-08-06 (×17): qty 1

## 2023-08-06 NOTE — ED Triage Notes (Signed)
 Pt. BIBEMS w/ c/o upper abd pain that started on Monday. Pt. Seen at ER but was sent home. Pt. Now c/o N/VD and pain that radiates up towards her chest and into her throat. Denies loss of appetite. Afebrile. Pt. Is non-ambulatory.  Per EMS: BP: 120/60 HR: 50 SPO2: 100% RA CBG: 154

## 2023-08-06 NOTE — ED Provider Notes (Addendum)
 [place] EMERGENCY DEPARTMENT AT [facility] Provider Note   CSN: [id number] Arrival date & time: 08/06/23  1003     Patient presents with: Abdominal Pain   [name] is a 89 y.o. female.   Patient with a complaint of abdominal pain this started about 3 in the morning associated with nausea.  Patient denies any vomiting or diarrhea.  The pain does radiate up into the chest area as well.  Patient was seen on June 16 that had more to do with her chronic back pain.  Patient states she still has that.  Patient does have a sitter that comes in every day during the week but not at night that just got started.  She did call her neighbor this morning because of the pain and the nausea.  And they called EMS.  Per EMS blood pressure was 120/60 heart rate 50 oxygen saturation on room air 100% blood sugar was 154.  Here temp 97.6 pulse 55 blood pressure 143/57.  Patient appears nontoxic no acute distress.  Past medical history significant for rheumatoid arthritis hypertension hyperlipidemia cerebrovascular disease diabetes chronic kidney failure stage IV.  Patient had cardiac bypass in 2003.  Send known to have coronary artery disease as well.       Prior to Admission medications   Medication Sig Start Date End Date Taking? Authorizing Provider  Accu-Chek Softclix Lancets lancets Use as instructed to check blood sugars up to 3 times a day  Dx code e11.65 05/17/19   Cleave Curling, MD  acetaminophen  (TYLENOL ) 325 MG tablet Take 2 tablets (650 mg total) by mouth every 6 (six) hours as needed for mild pain (pain score 1-3), moderate pain (pain score 4-6), fever or headache (or temp > 37.5 C (99.5 F)). 02/16/23   Elgergawy, Ardia Kraft, MD  amLODipine  (NORVASC ) 10 MG tablet Take 1 tablet (10 mg total) by mouth daily. 02/16/23 02/16/24  Elgergawy, Ardia Kraft, MD  Blood Glucose Calibration (ACCU-CHEK AVIVA) SOLN Use with accu-check aviva machine 06/02/19   Cleave Curling, MD  Blood  Glucose Monitoring Suppl (ACCU-CHEK AVIVA PLUS) w/Device KIT Use to check  Blood sugars up to 3 times a day.  Dx code e11.65 12/10/20   Cleave Curling, MD  carvedilol  (COREG ) 6.25 MG tablet Take 1 tablet (6.25 mg total) by mouth 2 (two) times daily. 02/16/23 02/16/24  Elgergawy, Ardia Kraft, MD  chlorthalidone  (HYGROTON ) 25 MG tablet Take 12.5 mg by mouth daily. 03/06/23   [provider]  Cholecalciferol (VITAMIN D3) 50 MCG (2000 UT) TABS Take 2,000 Units by mouth daily.    [provider]  clopidogrel  (PLAVIX ) 75 MG tablet Take 1 tablet (75 mg total) by mouth daily. 02/17/23   Elgergawy, Ardia Kraft, MD  Continuous Glucose Sensor (FREESTYLE LIBRE 2 SENSOR) MISC Inject 1 Device into the skin every 14 (fourteen) days.    [provider]  fluticasone  (FLONASE ) 50 MCG/ACT nasal spray Place 2 sprays into both nostrils See admin instructions. Instill 2 sprays into both nostrils in the morning as needed for allergies or rhinitis 06/03/22   [provider]  glucose blood test strip Use as instructed to check blood sugars up to 3 times a day.  Dx code e11.65 05/17/19   Cleave Curling, MD  hydroxychloroquine (PLAQUENIL) 200 MG tablet Take 200 mg by mouth daily. 03/31/23   [provider]  JARDIANCE  10 MG TABS tablet Take 10 mg by mouth daily. 03/06/23   [provider]  loratadine  (CLARITIN )  10 MG tablet Take 10 mg by mouth daily as needed for allergies or rhinitis.    [provider]  melatonin 3 MG TABS tablet Take 3 mg by mouth at bedtime. 03/06/23   [provider]  methocarbamol (ROBAXIN) 500 MG tablet Take 500 mg by mouth at bedtime. 03/06/23   [provider]  nitroGLYCERIN  (NITROSTAT ) 0.4 MG SL tablet PLACE 1 TABLET UNDER THE TONGUE AT THE FIRST SIGN OF ATTACK, MAY REPEAT EVERY 5 MINUTES FOR 3 DOSES IN 15 MINUTES. IF PAIN PERSISTS CALL 911 Patient taking differently: Place 0.4 mg under the tongue See admin instructions. PLACE 0.4  mg (1 TABLET) UNDER THE TONGUE AT THE FIRST SIGN OF ATTACK, MAY REPEAT EVERY 5 MINUTES FOR 3 DOSES IN 15 MINUTES. IF PAIN PERSISTS, CALL 911. 08/24/20   Cleave Curling, MD  NON FORMULARY Take 1 tablet by mouth See admin instructions. Century Mature 0.4 mg-300 mcg-250 mcg tablet- Take 1 tablet by mouth in the morning with breakfast    [provider]  pantoprazole  (PROTONIX ) 40 MG tablet Take 1 tablet (40 mg total) by mouth daily. Patient taking differently: Take 40 mg by mouth daily as needed. 02/17/23   Elgergawy, Ardia Kraft, MD  polyethylene glycol (MIRALAX  / GLYCOLAX ) 17 g packet Take 17 g by mouth daily as needed for mild constipation. 08/13/22   Danford, Willis Harter, MD  rosuvastatin  (CRESTOR ) 10 MG tablet Take 1 tablet (10 mg total) by mouth daily. Patient taking differently: Take 10 mg by mouth every evening. 02/17/23   Elgergawy, Ardia Kraft, MD    Allergies: Cyclobenzaprine  and Tramadol     Review of Systems  Constitutional:  Negative for chills and fever.  HENT:  Negative for ear pain and sore throat.   Eyes:  Negative for pain and visual disturbance.  Respiratory:  Negative for cough and shortness of breath.   Cardiovascular:  Negative for chest pain and palpitations.  Gastrointestinal:  Positive for abdominal pain and nausea. Negative for vomiting.  Genitourinary:  Negative for dysuria and hematuria.  Musculoskeletal:  Positive for back pain. Negative for arthralgias.  Skin:  Negative for color change and rash.  Neurological:  Negative for seizures and syncope.  All other systems reviewed and are negative.   Updated Vital Signs BP (!) 143/57 (BP Location: Left Arm)   Pulse (!) 55   Temp 97.6 F (36.4 C) (Oral)   Resp 18   Ht 1.499 m (4' 11)   Wt 56.7 kg   SpO2 100%   BMI 25.25 kg/m   Physical Exam Vitals and nursing note reviewed.  Constitutional:      General: She is not in acute distress.    Appearance: Normal appearance. She is well-developed.  HENT:      Head: Normocephalic and atraumatic.     Mouth/Throat:     Mouth: Mucous membranes are dry.   Eyes:     Extraocular Movements: Extraocular movements intact.     Conjunctiva/sclera: Conjunctivae normal.     Pupils: Pupils are equal, round, and reactive to light.    Cardiovascular:     Rate and Rhythm: Normal rate and regular rhythm.     Heart sounds: No murmur heard. Pulmonary:     Effort: Pulmonary effort is normal. No respiratory distress.     Breath sounds: Normal breath sounds.  Abdominal:     General: There is no distension.     Palpations: Abdomen is soft.     Tenderness: There is no abdominal tenderness.  There is no guarding.   Musculoskeletal:        General: Deformity present. No swelling.     Cervical back: Normal range of motion and neck supple.     Comments: Deformity to both shoulders.  Patient states she is got chronic dislocation of the right shoulder.   Skin:    General: Skin is warm and dry.     Capillary Refill: Capillary refill takes less than 2 seconds.   Neurological:     Mental Status: She is alert and oriented to person, place, and time. Mental status is at baseline.   Psychiatric:        Mood and Affect: Mood normal.     (all labs ordered are listed, but only abnormal results are displayed) Labs Reviewed  CBC WITH DIFFERENTIAL/PLATELET - Abnormal; Notable for the following components:      Result Value   RBC 3.24 (*)    Hemoglobin 9.7 (*)    HCT 30.8 (*)    Lymphs Abs 0.5 (*)    All other components within normal limits  COMPREHENSIVE METABOLIC PANEL WITH GFR  LIPASE, BLOOD  URINALYSIS, ROUTINE W REFLEX MICROSCOPIC  TROPONIN I (HIGH SENSITIVITY)    EKG: EKG Interpretation Date/Time:  Thursday August 06 2023 10:18:05 EDT Ventricular Rate:  54 PR Interval:    QRS Duration:  119 QT Interval:  472 QTC Calculation: 448 R Axis:   -9  Text Interpretation: Junctional rhythm Nonspecific intraventricular conduction delay Nonspecific T  abnormalities, inferior leads No significant change since last tracing Confirmed by Iana Buzan [phone]) on 08/06/2023 10:29:55 AM  Radiology: No results found.   Procedures   Medications Ordered in the ED  sodium chloride  0.9 % bolus 500 mL (500 mLs Intravenous New Bag/Given 08/06/23 1123)  ondansetron  (ZOFRAN ) injection 4 mg (4 mg Intravenous Given 08/06/23 1123)                                    Medical Decision Making Amount and/or Complexity of Data Reviewed Labs: ordered. Radiology: ordered.  Risk Prescription drug management.   CBC white count 4.9 hemoglobin 9.7 platelets 265.  Complete metabolic panel lipase pending troponins are pending.  EKG without acute findings.  Will get CT scan abdomen and pelvis as well will give 500 cc fluid bolus.  Patient does appear dehydrated.  Will get urinalysis as well.  CBC as stated above.  Complete metabolic panel GFR today is 14 with a creatinine of 2.97, patient's creatinine was 2.7 just on the 16th.  She is showing a worsening trend.  Consistent with a dehydration picture.  LFTs are normal electrolytes are normal.  Patient is known to have chronic kidney disease.  Patient's lipase normal initial troponin 31 delta troponin to be required chest x-ray just shows persistent chronic dislocation of the right shoulder.  CT scan abdomen and pelvis without any acute findings.  I think based on the worsening renal function and her being at home by herself that she would benefit to come in for some IV fluids.  She there is a little bit of improvement in her renal function.  Will discuss with hospitalist.  Urinalysis is still pending.  And delta troponin needs to be checked.    Final diagnoses:  Generalized abdominal pain    ED Discharge Orders     None          Nicklas Barns, MD 08/06/23 1128  Emmry Hinsch, MD 08/06/23 1226

## 2023-08-06 NOTE — Plan of Care (Signed)
  Problem: Education: Goal: Ability to describe self-care measures that may prevent or decrease complications (Diabetes Survival Skills Education) will improve Outcome: Progressing Goal: Individualized Educational Video(s) Outcome: Progressing   Problem: Coping: Goal: Ability to adjust to condition or change in health will improve Outcome: Progressing   Problem: Fluid Volume: Goal: Ability to maintain a balanced intake and output will improve Outcome: Progressing   Problem: Health Behavior/Discharge Planning: Goal: Ability to identify and utilize available resources and services will improve Outcome: Progressing Goal: Ability to manage health-related needs will improve Outcome: Progressing   Problem: Metabolic: Goal: Ability to maintain appropriate glucose levels will improve Outcome: Progressing   Problem: Skin Integrity: Goal: Risk for impaired skin integrity will decrease Outcome: Progressing   Problem: Education: Goal: Knowledge of General Education information will improve Description: Including pain rating scale, medication(s)/side effects and non-pharmacologic comfort measures Outcome: Progressing   Problem: Clinical Measurements: Goal: Ability to maintain clinical measurements within normal limits will improve Outcome: Progressing Goal: Will remain free from infection Outcome: Progressing Goal: Diagnostic test results will improve Outcome: Progressing Goal: Respiratory complications will improve Outcome: Progressing Goal: Cardiovascular complication will be avoided Outcome: Progressing   Problem: Pain Managment: Goal: General experience of comfort will improve and/or be controlled Outcome: Progressing   Problem: Safety: Goal: Ability to remain free from injury will improve Outcome: Progressing   Problem: Skin Integrity: Goal: Risk for impaired skin integrity will decrease Outcome: Progressing

## 2023-08-06 NOTE — ED Notes (Signed)
 Xray bedside.

## 2023-08-06 NOTE — Evaluation (Signed)
 Physical Therapy Evaluation Patient Details Name: [name] MRN: [id number] DOB: Dec 26, 1927 Today's Date: 08/06/2023  History of Present Illness  89 y.o. female  who lives independently with the assistance of her son who lives next door,  being admitted to the hospital with concern for dehydration and AKI superimposed on CKD stage IV.  medical history significant for CKD stage IV, CVA on Plavix , non-insulin -dependent type 2 diabetes, RA, hypertension  Clinical Impression  Pt admitted with above diagnosis.  Pt agreeable to PT, reports she was independent until ~ 1 month ago; recently pt has been functioning at bed level, often unable to roll or sit up.  Assisted supine <> sit with mod assist, further mobility limited by pain.  Patient will benefit from continued  follow up therapy, <3 hours/day   Pt currently with functional limitations due to the deficits listed below (see PT Problem List). Pt will benefit from acute skilled PT to increase their independence and safety with mobility to allow discharge.           If plan is discharge home, recommend the following: A lot of help with walking and/or transfers;Assistance with cooking/housework;A lot of help with bathing/dressing/bathroom;Help with stairs or ramp for entrance;Assist for transportation   Can travel by private vehicle   No    Equipment Recommendations None recommended by PT  Recommendations for Other Services       Functional Status Assessment Patient has had a recent decline in their functional status and demonstrates the ability to make significant improvements in function in a reasonable and predictable amount of time.     Precautions / Restrictions Precautions Precautions: Fall      Mobility  Bed Mobility Overal bed mobility: Needs Assistance Bed Mobility: Supine to Sit, Sit to Supine     Supine to sit: Mod assist Sit to supine: Mod assist   General bed mobility comments: assist to progress LEs  on and off bed, pt able to self assist trunk to upright;  total assist to scoot up in bed    Transfers                   General transfer comment: unable to attempt d/t pain    Ambulation/Gait                  Stairs            Wheelchair Mobility     Tilt Bed    Modified Rankin (Stroke Patients Only)       Balance Overall balance assessment: Needs assistance Sitting-balance support: Feet supported, No upper extremity supported Sitting balance-[name]: Fair         Standing balance comment: NT                             Pertinent Vitals/Pain Pain Assessment Pain Assessment: Faces Faces Pain Scale: Hurts little more Pain Location: bil knees Pain Descriptors / Indicators: Sore, Grimacing Pain Intervention(s): Limited activity within patient's tolerance, Monitored during session    Home Living Family/patient expects to be discharged to:: Private residence Living Arrangements: Alone Available Help at Discharge: Available PRN/intermittently;Personal care attendant Type of Home: House Home Access: Ramped entrance     Alternate Level Stairs-Number of Steps: does not go to second level Home Layout: Two level;Able to live on main level with bedroom/bathroom Home Equipment: Wheelchair - manual;Wheelchair - power;Shower seat;BSC/3in1;Rolling Environmental consultant (2 wheels);Cane - single point;Rollator (4 wheels);Hospital  bed Additional Comments: meals on wheels 1x/day; cargeiver ~3 days/wk for 3 hrs. son checks in, does not physically assist    Prior Function Prior Level of Function : Needs assist       Physical Assist : Mobility (physical);ADLs (physical) Mobility (physical): Bed mobility;Transfers   Mobility Comments: pt reports she has been mostly in the bed for ~ 1 month, the last 2 wks she has been unable to roll in the bed, caregivers began ~ 2 wks ago. pt wears depends and uses voids or has BMs in tthe bed, waits until caregiver comes  to help her perform hygiene ADLs Comments: previous notes say son's girlfriend assists with ADLs, pt does not report family assisting her at this time     Extremity/Trunk Assessment   Upper Extremity Assessment Upper Extremity Assessment: Defer to OT evaluation    Lower Extremity Assessment Lower Extremity Assessment: Generalized weakness;LLE deficits/detail;RLE deficits/detail RLE Deficits / Details: knee flexion to ~ 40 degrees, initially limited by pain, with time ArOM WFL ; ankles grossly WFL; MMT grossly 2+/5 RLE: Unable to fully assess due to pain LLE Deficits / Details: knee flexion to ~ 40 degrees, initially limited by pain, with time ArOM WFL; ankles grossly WFL; MMT grossly 2+/5 LLE: Unable to fully assess due to pain       Communication   Communication Communication: No apparent difficulties Factors Affecting Communication: Difficulty expressing self (difficulty completing thoughts at times, delayed response time, repeats self intermittently)    Cognition Arousal: Alert Behavior During Therapy: WFL for tasks assessed/performed   PT - Cognitive impairments: No apparent impairments, No family/caregiver present to determine baseline, Initiation                       PT - Cognition Comments: Ox3; delayed response time Following commands: Intact       Cueing Cueing Techniques: Verbal cues     General Comments      Exercises     Assessment/Plan    PT Assessment Patient needs continued PT services  PT Problem List Decreased strength;Decreased mobility;Decreased activity tolerance;Decreased balance;Decreased knowledge of use of DME;Pain       PT Treatment Interventions DME instruction;Therapeutic exercise;Gait training;Functional mobility training;Therapeutic activities;Patient/family education;Balance training    PT Goals (Current goals can be found in the Care Plan section)  Acute Rehab PT Goals PT Goal Formulation: With patient Time For Goal  Achievement: 08/20/23 Potential to Achieve Goals: Fair    Frequency Min 3X/week     Co-evaluation               AM-PAC PT 6 Clicks Mobility  Outcome Measure Help needed turning from your back to your side while in a flat bed without using bedrails?: A Lot Help needed moving from lying on your back to sitting on the side of a flat bed without using bedrails?: A Lot Help needed moving to and from a bed to a chair (including a wheelchair)?: Total Help needed standing up from a chair using your arms (e.g., wheelchair or bedside chair)?: Total Help needed to walk in hospital room?: Total Help needed climbing 3-5 steps with a railing? : Total 6 Click Score: 8    End of Session   Activity Tolerance: Patient limited by pain;Patient tolerated treatment well Patient left: in bed;with call bell/phone within reach;with bed alarm set   PT Visit Diagnosis: Other abnormalities of gait and mobility (R26.89);Muscle weakness (generalized) (M62.81)    Time: 3086-5784 PT Time Calculation (  min) (ACUTE ONLY): 28 min   Charges:   PT Evaluation $PT Eval Low Complexity: 1 Low PT Treatments $Therapeutic Activity: 8-22 mins PT General Charges $$ ACUTE PT VISIT: 1 Visit         Karter Haire, PT  Acute Rehab Dept [facility]) [phone]  08/06/2023   [facility] 08/06/2023, 4:50 PM

## 2023-08-06 NOTE — H&P (Signed)
 History and Physical  [name] EXB:[id number] DOB: 01-01-1928 DOA: 08/06/2023  PCP: Sharma Dears, NP   Chief Complaint: Nausea  HPI: [name] is a 89 y.o. female with medical history significant for CKD stage IV, CVA on Plavix , non-insulin -dependent type 2 diabetes, RA, hypertension who lives independently with the assistance of her son who lives next door and is being admitted to the hospital with concern for dehydration and AKI superimposed on CKD stage IV.  History is provided by the patient, who is mentally sharp.  She tells me that she has been dealing with sciatica lately was actually seen in the [facility] ER for this on 6/16.  She is closely assisted by her son, has equipment at home including a hospital bed and was discharged home with plan to follow-up with PCP and orthopedics.  Early this morning, patient states that she had sudden onset of nausea without vomiting.  She also had 2 very loose bowel movements which are atypical for her.  She states that she does not eat or drink very much, but has been eating and drinking as much as she can and does not feel like it is any less than it has been recently.  She was brought to the ER this morning for evaluation found to have worsening renal failure.  Review of Systems: Please see HPI for pertinent positives and negatives. A complete 10 system review of systems are otherwise negative.  Past Medical History:  Diagnosis Date   Cerebrovascular disease, unspecified    Chronic kidney failure, stage 4 (severe) (HCC)    Coronary atherosclerosis of unspecified type of vessel, native or graft    CVA (cerebral vascular accident) (HCC)    Diabetes mellitus without complication (HCC)    Other and unspecified hyperlipidemia    Rheumatoid arthritis(714.0)    Shingles 2003   Unspecified essential hypertension    Past Surgical History:  Procedure Laterality Date   Cardiac Bypass  2003   CATARACT EXTRACTION,  BILATERAL  2007   Social History:  reports that she has never smoked. She has never used smokeless tobacco. She reports that she does not drink alcohol and does not use drugs.  Allergies  Allergen Reactions   Cyclobenzaprine  Other (See Comments)    Reaction not recalled by patient   Tramadol  Other (See Comments)    Hallucinations     Family History  Problem Relation Age of Onset   Cancer Mother        colon   Hypertension Father    Cancer Brother    Heart disease Brother    Hypertension Paternal Grandmother    Diabetes Neg Hx    Coronary artery disease Neg Hx    Stroke Neg Hx      Prior to Admission medications   Medication Sig Start Date End Date Taking? Authorizing Provider  Accu-Chek Softclix Lancets lancets Use as instructed to check blood sugars up to 3 times a day  Dx code e11.65 05/17/19   Cleave Curling, MD  acetaminophen  (TYLENOL ) 325 MG tablet Take 2 tablets (650 mg total) by mouth every 6 (six) hours as needed for mild pain (pain score 1-3), moderate pain (pain score 4-6), fever or headache (or temp > 37.5 C (99.5 F)). 02/16/23   Elgergawy, Ardia Kraft, MD  amLODipine  (NORVASC ) 10 MG tablet Take 1 tablet (10 mg total) by mouth daily. 02/16/23 02/16/24  Elgergawy, Ardia Kraft, MD  Blood Glucose Calibration (ACCU-CHEK AVIVA) SOLN Use with accu-check aviva machine 06/02/19  Cleave Curling, MD  Blood Glucose Monitoring Suppl (ACCU-CHEK AVIVA PLUS) w/Device KIT Use to check  Blood sugars up to 3 times a day.  Dx code e11.65 12/10/20   Cleave Curling, MD  carvedilol  (COREG ) 6.25 MG tablet Take 1 tablet (6.25 mg total) by mouth 2 (two) times daily. 02/16/23 02/16/24  Elgergawy, Ardia Kraft, MD  chlorthalidone  (HYGROTON ) 25 MG tablet Take 12.5 mg by mouth daily. 03/06/23   [provider]  Cholecalciferol (VITAMIN D3) 50 MCG (2000 UT) TABS Take 2,000 Units by mouth daily.    [provider]  clopidogrel  (PLAVIX ) 75 MG tablet Take 1 tablet (75 mg total) by mouth  daily. 02/17/23   Elgergawy, Ardia Kraft, MD  Continuous Glucose Sensor (FREESTYLE LIBRE 2 SENSOR) MISC Inject 1 Device into the skin every 14 (fourteen) days.    [provider]  fluticasone  (FLONASE ) 50 MCG/ACT nasal spray Place 2 sprays into both nostrils See admin instructions. Instill 2 sprays into both nostrils in the morning as needed for allergies or rhinitis 06/03/22   [provider]  glucose blood test strip Use as instructed to check blood sugars up to 3 times a day.  Dx code e11.65 05/17/19   Cleave Curling, MD  hydroxychloroquine (PLAQUENIL) 200 MG tablet Take 200 mg by mouth daily. 03/31/23   [provider]  JARDIANCE  10 MG TABS tablet Take 10 mg by mouth daily. 03/06/23   [provider]  loratadine  (CLARITIN ) 10 MG tablet Take 10 mg by mouth daily as needed for allergies or rhinitis.    [provider]  melatonin 3 MG TABS tablet Take 3 mg by mouth at bedtime. 03/06/23   [provider]  methocarbamol (ROBAXIN) 500 MG tablet Take 500 mg by mouth at bedtime. 03/06/23   [provider]  nitroGLYCERIN  (NITROSTAT ) 0.4 MG SL tablet PLACE 1 TABLET UNDER THE TONGUE AT THE FIRST SIGN OF ATTACK, MAY REPEAT EVERY 5 MINUTES FOR 3 DOSES IN 15 MINUTES. IF PAIN PERSISTS CALL 911 Patient taking differently: Place 0.4 mg under the tongue See admin instructions. PLACE 0.4 mg (1 TABLET) UNDER THE TONGUE AT THE FIRST SIGN OF ATTACK, MAY REPEAT EVERY 5 MINUTES FOR 3 DOSES IN 15 MINUTES. IF PAIN PERSISTS, CALL 911. 08/24/20   Cleave Curling, MD  NON FORMULARY Take 1 tablet by mouth See admin instructions. Century Mature 0.4 mg-300 mcg-250 mcg tablet- Take 1 tablet by mouth in the morning with breakfast    [provider]  pantoprazole  (PROTONIX ) 40 MG tablet Take 1 tablet (40 mg total) by mouth daily. Patient taking differently: Take 40 mg by mouth daily as needed. 02/17/23   Elgergawy, Dawood S, MD  polyethylene glycol (MIRALAX  / GLYCOLAX ) 17  g packet Take 17 g by mouth daily as needed for mild constipation. 08/13/22   Danford, Willis Harter, MD  rosuvastatin  (CRESTOR ) 10 MG tablet Take 1 tablet (10 mg total) by mouth daily. Patient taking differently: Take 10 mg by mouth every evening. 02/17/23   Elgergawy, Ardia Kraft, MD    Physical Exam: BP (!) 143/57 (BP Location: Left Arm)   Pulse (!) 55   Temp 97.6 F (36.4 C) (Oral)   Resp 18   Ht 4' 11 (1.499 m)   Wt 56.7 kg   SpO2 100%   BMI 25.25 kg/m  General:  Alert, oriented, calm, in no acute distress, looks younger than her stated age, pleasant and cooperative.  Mucous membranes are dry. Cardiovascular: RRR, no murmurs or rubs,  no peripheral edema  Respiratory: clear to auscultation bilaterally, no wheezes, no crackles  Abdomen: soft, nontender, nondistended, normal bowel tones heard  Skin: dry, no rashes  Musculoskeletal: no joint effusions, normal range of motion  Psychiatric: appropriate affect, normal speech  Neurologic: extraocular muscles intact, clear speech, moving all extremities with intact sensorium         Labs on Admission:  Basic Metabolic Panel: Recent Labs  Lab 08/03/23 0725 08/06/23 1117  NA 138 136  K 4.5 4.9  CL 104 104  CO2 22 25  GLUCOSE 88 117*  BUN 61* 68*  CREATININE 2.72* 2.97*  CALCIUM  9.3 9.3   Liver Function Tests: Recent Labs  Lab 08/03/23 0725 08/06/23 1117  AST 21 23  ALT 10 10  ALKPHOS 59 70  BILITOT 0.7 0.6  PROT 6.3* 6.7  ALBUMIN 3.2* 3.5   Recent Labs  Lab 08/06/23 1117  LIPASE 38   No results for input(s): AMMONIA in the last 168 hours. CBC: Recent Labs  Lab 08/03/23 0725 08/06/23 1117  WBC 4.9 4.9  NEUTROABS  --  3.7  HGB 9.5* 9.7*  HCT 29.8* 30.8*  MCV 96.1 95.1  PLT 259 265   Cardiac Enzymes: No results for input(s): CKTOTAL, CKMB, CKMBINDEX, TROPONINI in the last 168 hours. BNP (last 3 results) No results for input(s): BNP in the last 8760 hours.  ProBNP (last 3 results) No results  for input(s): PROBNP in the last 8760 hours.  CBG: Recent Labs  Lab 08/03/23 0815  GLUCAP 89    Radiological Exams on Admission: DG Chest Port 1 View Result Date: 08/06/2023 CLINICAL DATA:  Chest pain. EXAM: PORTABLE CHEST 1 VIEW COMPARISON:  05/12/2023. FINDINGS: Stable cardiomediastinal silhouette. Aortic atherosclerosis. Prior median sternotomy and CABG. No focal consolidation, pleural effusion, or pneumothorax. Redemonstrated anterior dislocation of the right glenohumeral joint with remote humeral head fracture. IMPRESSION: 1. No acute cardiopulmonary findings. 2. Redemonstrated anterior dislocation of the right glenohumeral joint with remote humeral head fracture. Electronically Signed   By: Mannie Seek M.D.   On: 08/06/2023 12:15   CT ABDOMEN PELVIS WO CONTRAST Result Date: 08/06/2023 CLINICAL DATA:  Abdominal pain, acute, nonlocalized EXAM: CT ABDOMEN AND PELVIS WITHOUT CONTRAST TECHNIQUE: Multidetector CT imaging of the abdomen and pelvis was performed following the standard protocol without IV contrast. RADIATION DOSE REDUCTION: This exam was performed according to the departmental dose-optimization program which includes automated exposure control, adjustment of the mA and/or kV according to patient size and/or use of iterative reconstruction technique. COMPARISON:  05/12/2023. FINDINGS: Lower chest: Mild bibasilar linear atelectasis/scarring. Hepatobiliary: No suspicious focal hepatic lesion identified within the limits of an unenhanced exam. Unchanged cholelithiasis. No gallbladder wall thickening or pericholecystic fluid. No biliary dilatation. Pancreas: Unremarkable Spleen: Unremarkable Adrenals/Urinary Tract: Adrenal glands are unremarkable. No urolithiasis or hydronephrosis. Bladder is unremarkable. Stomach/Bowel: Stomach is within normal limits. No evidence of obstruction or focal inflammatory changes. Appendix is not clearly identified. Sigmoid colonic diverticulosis without  evidence of acute diverticulitis. Vascular/Lymphatic: Abdominal aorta is normal in caliber with atherosclerotic calcification. No enlarged abdominal or pelvic lymph nodes. Reproductive: Uterus and bilateral adnexa are unremarkable. Other: No abdominopelvic ascites. No intraperitoneal free air. Persistent diffuse body wall edema. Musculoskeletal: Redemonstrated inferior endplate compression deformity of L1, unchanged. Multilevel degenerative changes of the lumbar spine with similar grade 1 anterolisthesis of L3 on L4. Redemonstrated severe arthritic changes of the right hip with concentric superior predominant joint space loss, bone-on-bone contact, subchondral cystic changes/marginal erosions. These findings may be  secondary to inflammatory arthropathy, such as rheumatoid arthritis. Mild-to-moderate degenerative changes of the left hip. IMPRESSION: 1. No acute localizing findings in the abdomen or pelvis. 2. Sigmoid colonic diverticulosis without evidence of acute diverticulitis. 3. Cholelithiasis without evidence of acute cholecystitis. 4. Additional unchanged chronic findings, as above. 5.  Aortic Atherosclerosis (ICD10-I70.0). Electronically Signed   By: Mannie Seek M.D.   On: 08/06/2023 12:12   Assessment/Plan Hilja Murchison Parkhurst is a 89 y.o. female with medical history significant for CKD stage IV, CVA on Plavix , non-insulin -dependent type 2 diabetes, RA, hypertension who lives independently with the assistance of her son who lives next door and is being admitted to the hospital with concern for dehydration and AKI superimposed on CKD stage IV.  Acute kidney injury superimposed on CKD stage IV-I suspect this is due to ATN related to dehydration from inadequate p.o. intake and in the setting of diarrhea. -Observation admission -IV fluids -Avoid nephrotoxins, renally dose medications as appropriate  History of CVA-continue Plavix   Type 2 diabetes-carb modified diet, moderate dose sliding  scale while in house  Hypertension-resume home meds once reconciled  Rheumatoid arthritis-patient has Plaquenil on her home medication list but unclear whether she takes this currently.  Will await medication reconciliation, and resume if appropriate.  Chronic sciatica and weakness-patient reportedly has DME and hospital bed at home -PT/OT -TOC consult  DVT prophylaxis: Subcutaneous heparin     Code Status: Do not attempt resuscitation (DNR) PRE-ARREST INTERVENTIONS DESIRED  Consults called: None  Admission status: Observation  Time spent: 49 minutes  Cougar Imel Rickey Charm MD [facility] Pager [phone]  If 7PM-7AM, please contact night-coverage [email or web address] Password [email or web address]  08/06/2023, 1:30 PM

## 2023-08-06 NOTE — ED Notes (Signed)
 Pt CT

## 2023-08-07 DIAGNOSIS — E1122 Type 2 diabetes mellitus with diabetic chronic kidney disease: Secondary | ICD-10-CM | POA: Diagnosis present

## 2023-08-07 DIAGNOSIS — T402X5A Adverse effect of other opioids, initial encounter: Secondary | ICD-10-CM | POA: Diagnosis not present

## 2023-08-07 DIAGNOSIS — G8929 Other chronic pain: Secondary | ICD-10-CM | POA: Diagnosis present

## 2023-08-07 DIAGNOSIS — R197 Diarrhea, unspecified: Secondary | ICD-10-CM | POA: Diagnosis present

## 2023-08-07 DIAGNOSIS — E860 Dehydration: Secondary | ICD-10-CM | POA: Diagnosis present

## 2023-08-07 DIAGNOSIS — Z66 Do not resuscitate: Secondary | ICD-10-CM | POA: Diagnosis present

## 2023-08-07 DIAGNOSIS — M5430 Sciatica, unspecified side: Secondary | ICD-10-CM | POA: Diagnosis present

## 2023-08-07 DIAGNOSIS — E785 Hyperlipidemia, unspecified: Secondary | ICD-10-CM | POA: Diagnosis present

## 2023-08-07 DIAGNOSIS — N189 Chronic kidney disease, unspecified: Secondary | ICD-10-CM | POA: Diagnosis present

## 2023-08-07 DIAGNOSIS — T43025A Adverse effect of tetracyclic antidepressants, initial encounter: Secondary | ICD-10-CM | POA: Diagnosis not present

## 2023-08-07 DIAGNOSIS — R627 Adult failure to thrive: Secondary | ICD-10-CM | POA: Diagnosis present

## 2023-08-07 DIAGNOSIS — I679 Cerebrovascular disease, unspecified: Secondary | ICD-10-CM | POA: Diagnosis present

## 2023-08-07 DIAGNOSIS — Y718 Miscellaneous cardiovascular devices associated with adverse incidents, not elsewhere classified: Secondary | ICD-10-CM | POA: Diagnosis not present

## 2023-08-07 DIAGNOSIS — N184 Chronic kidney disease, stage 4 (severe): Secondary | ICD-10-CM

## 2023-08-07 DIAGNOSIS — I129 Hypertensive chronic kidney disease with stage 1 through stage 4 chronic kidney disease, or unspecified chronic kidney disease: Secondary | ICD-10-CM | POA: Diagnosis present

## 2023-08-07 DIAGNOSIS — R109 Unspecified abdominal pain: Secondary | ICD-10-CM | POA: Diagnosis present

## 2023-08-07 DIAGNOSIS — D631 Anemia in chronic kidney disease: Secondary | ICD-10-CM | POA: Diagnosis present

## 2023-08-07 DIAGNOSIS — R1084 Generalized abdominal pain: Secondary | ICD-10-CM | POA: Diagnosis not present

## 2023-08-07 DIAGNOSIS — N390 Urinary tract infection, site not specified: Secondary | ICD-10-CM | POA: Diagnosis present

## 2023-08-07 DIAGNOSIS — Z8673 Personal history of transient ischemic attack (TIA), and cerebral infarction without residual deficits: Secondary | ICD-10-CM | POA: Diagnosis not present

## 2023-08-07 DIAGNOSIS — T428X5A Adverse effect of antiparkinsonism drugs and other central muscle-tone depressants, initial encounter: Secondary | ICD-10-CM | POA: Diagnosis not present

## 2023-08-07 DIAGNOSIS — M069 Rheumatoid arthritis, unspecified: Secondary | ICD-10-CM | POA: Diagnosis present

## 2023-08-07 DIAGNOSIS — I2510 Atherosclerotic heart disease of native coronary artery without angina pectoris: Secondary | ICD-10-CM | POA: Diagnosis present

## 2023-08-07 DIAGNOSIS — R630 Anorexia: Secondary | ICD-10-CM | POA: Diagnosis present

## 2023-08-07 DIAGNOSIS — N179 Acute kidney failure, unspecified: Secondary | ICD-10-CM | POA: Diagnosis present

## 2023-08-07 DIAGNOSIS — R609 Edema, unspecified: Secondary | ICD-10-CM | POA: Diagnosis not present

## 2023-08-07 DIAGNOSIS — G928 Other toxic encephalopathy: Secondary | ICD-10-CM | POA: Diagnosis not present

## 2023-08-07 DIAGNOSIS — E875 Hyperkalemia: Secondary | ICD-10-CM | POA: Diagnosis not present

## 2023-08-07 DIAGNOSIS — R110 Nausea: Secondary | ICD-10-CM | POA: Diagnosis present

## 2023-08-07 HISTORY — DX: Chronic kidney disease, unspecified: N17.9

## 2023-08-07 LAB — URINALYSIS, ROUTINE W REFLEX MICROSCOPIC
Bilirubin Urine: NEGATIVE
Glucose, UA: 150 mg/dL — AB
Ketones, ur: NEGATIVE mg/dL
Nitrite: NEGATIVE
Protein, ur: NEGATIVE mg/dL
Specific Gravity, Urine: 1.01 (ref 1.005–1.030)
pH: 5 (ref 5.0–8.0)

## 2023-08-07 LAB — FERRITIN: Ferritin: 138 ng/mL (ref 11–307)

## 2023-08-07 LAB — BASIC METABOLIC PANEL WITH GFR
Anion gap: 8 (ref 5–15)
BUN: 59 mg/dL — ABNORMAL HIGH (ref 8–23)
CO2: 23 mmol/L (ref 22–32)
Calcium: 8.6 mg/dL — ABNORMAL LOW (ref 8.9–10.3)
Chloride: 107 mmol/L (ref 98–111)
Creatinine, Ser: 2.5 mg/dL — ABNORMAL HIGH (ref 0.44–1.00)
GFR, Estimated: 17 mL/min — ABNORMAL LOW (ref >60)
Glucose, Bld: 86 mg/dL (ref 70–99)
Potassium: 4 mmol/L (ref 3.5–5.1)
Sodium: 138 mmol/L (ref 135–145)

## 2023-08-07 LAB — TSH: TSH: 2.917 u[IU]/mL (ref 0.350–4.500)

## 2023-08-07 LAB — RETICULOCYTES
Immature Retic Fract: 10.2 % (ref 2.3–15.9)
RBC.: 3.06 MIL/uL — ABNORMAL LOW (ref 3.87–5.11)
Retic Count, Absolute: 49 10*3/uL (ref 19.0–186.0)
Retic Ct Pct: 1.6 % (ref 0.4–3.1)

## 2023-08-07 LAB — CBC
HCT: 28.2 % — ABNORMAL LOW (ref 36.0–46.0)
Hemoglobin: 8.9 g/dL — ABNORMAL LOW (ref 12.0–15.0)
MCH: 30.7 pg (ref 26.0–34.0)
MCHC: 31.6 g/dL (ref 30.0–36.0)
MCV: 97.2 fL (ref 80.0–100.0)
Platelets: 234 10*3/uL (ref 150–400)
RBC: 2.9 MIL/uL — ABNORMAL LOW (ref 3.87–5.11)
RDW: 13.2 % (ref 11.5–15.5)
WBC: 4 10*3/uL (ref 4.0–10.5)
nRBC: 0 % (ref 0.0–0.2)

## 2023-08-07 LAB — URINALYSIS, W/ REFLEX TO CULTURE (INFECTION SUSPECTED)
Bilirubin Urine: NEGATIVE
Glucose, UA: 150 mg/dL — AB
Hgb urine dipstick: NEGATIVE
Ketones, ur: NEGATIVE mg/dL
Nitrite: NEGATIVE
Protein, ur: 30 mg/dL — AB
Specific Gravity, Urine: 1.012 (ref 1.005–1.030)
WBC, UA: >50 WBC/hpf (ref 0–5)
pH: 5 (ref 5.0–8.0)

## 2023-08-07 LAB — GLUCOSE, CAPILLARY
Glucose-Capillary: 79 mg/dL (ref 70–99)
Glucose-Capillary: 123 mg/dL — ABNORMAL HIGH (ref 70–99)
Glucose-Capillary: 73 mg/dL (ref 70–99)
Glucose-Capillary: 93 mg/dL (ref 70–99)

## 2023-08-07 LAB — IRON AND TIBC
Iron: 49 ug/dL (ref 28–170)
Saturation Ratios: 17 % (ref 10.4–31.8)
TIBC: 284 ug/dL (ref 250–450)
UIBC: 235 ug/dL

## 2023-08-07 LAB — VITAMIN B12: Vitamin B-12: 451 pg/mL (ref 180–914)

## 2023-08-07 LAB — FOLATE: Folate: >40 ng/mL (ref >5.9)

## 2023-08-07 MED ORDER — VITAMIN D 25 MCG (1000 UNIT) PO TABS
2000.0000 [IU] | ORAL_TABLET | Freq: Every day | ORAL | Status: DC
  Administered 2023-08-07 – 2023-08-12 (×5): 2000 [IU] via ORAL
  Filled 2023-08-07 (×5): qty 2

## 2023-08-07 MED ORDER — SODIUM CHLORIDE 0.9 % IV SOLN
1.0000 g | INTRAVENOUS | Status: DC
  Administered 2023-08-07 – 2023-08-11 (×5): 1 g via INTRAVENOUS
  Filled 2023-08-07 (×5): qty 10

## 2023-08-07 MED ORDER — GLUCERNA SHAKE PO LIQD
237.0000 mL | Freq: Three times a day (TID) | ORAL | Status: DC
  Administered 2023-08-07 – 2023-08-12 (×8): 237 mL via ORAL
  Filled 2023-08-07 (×16): qty 237

## 2023-08-07 MED ORDER — HYDROCODONE-ACETAMINOPHEN 5-325 MG PO TABS
1.0000 | ORAL_TABLET | Freq: Two times a day (BID) | ORAL | Status: DC | PRN
  Administered 2023-08-07 – 2023-08-08 (×2): 1 via ORAL
  Filled 2023-08-07 (×2): qty 1

## 2023-08-07 MED ORDER — HYDROCODONE-ACETAMINOPHEN 5-325 MG PO TABS
1.0000 | ORAL_TABLET | Freq: Once | ORAL | Status: AC
  Administered 2023-08-07: 1 via ORAL
  Filled 2023-08-07: qty 1

## 2023-08-07 MED ORDER — SODIUM CHLORIDE 0.9 % IV SOLN: INTRAVENOUS | Status: AC

## 2023-08-07 MED ORDER — PANTOPRAZOLE SODIUM 40 MG PO TBEC
40.0000 mg | DELAYED_RELEASE_TABLET | Freq: Every day | ORAL | Status: DC
  Administered 2023-08-07 – 2023-08-12 (×5): 40 mg via ORAL
  Filled 2023-08-07 (×5): qty 1

## 2023-08-07 MED ORDER — HYDROXYCHLOROQUINE SULFATE 200 MG PO TABS
200.0000 mg | ORAL_TABLET | Freq: Every day | ORAL | Status: DC
  Administered 2023-08-07 – 2023-08-12 (×5): 200 mg via ORAL
  Filled 2023-08-07 (×6): qty 1

## 2023-08-07 MED ORDER — MIRTAZAPINE 15 MG PO TABS
7.5000 mg | ORAL_TABLET | Freq: Every day | ORAL | Status: DC
  Administered 2023-08-07 – 2023-08-09 (×3): 7.5 mg via ORAL
  Filled 2023-08-07 (×3): qty 1

## 2023-08-07 MED ORDER — METHOCARBAMOL 500 MG PO TABS
500.0000 mg | ORAL_TABLET | Freq: Every day | ORAL | Status: DC
  Administered 2023-08-07 – 2023-08-09 (×3): 500 mg via ORAL
  Filled 2023-08-07 (×3): qty 1

## 2023-08-07 NOTE — [place] FL2 (Signed)
 [place]  MEDICAID FL2 LEVEL OF CARE FORM     IDENTIFICATION  Patient Name: [name] Birthdate: Sep 20, 1927 Sex: female Admission Date (Current Location): 08/06/2023  [facility] and [place] Number:  Producer, television/film/video and Address:  [facility],  [address], [place] [address]      Provider Number: [id number]  Attending Physician Name and Address:  Loma Rising, MD  Relative Name and Phone Number:  Eilyn Polack   [phone]    Current Level of Care: Hospital Recommended Level of Care: Skilled Nursing Facility Prior Approval Number:    Date Approved/Denied:   PASRR Number: [id number]  Discharge Plan:      Current Diagnoses: Patient Active Problem List   Diagnosis Date Noted   Acute kidney injury superimposed on chronic kidney disease (HCC) 08/07/2023   Failure to thrive in adult 08/07/2023   Dehydration 08/06/2023   SVC syndrome 05/12/2023   Acute CVA (cerebrovascular accident) (HCC) 02/13/2023   Anemia of chronic disease 02/12/2023   CVA (cerebral vascular accident) (HCC) 08/07/2022   History of CAD (coronary artery disease) of artery bypass graft 08/07/2022   Arthritis of right hip 08/06/2021   Unilateral primary osteoarthritis, right knee 08/06/2021   Closed compression fracture of body of L1 vertebra (HCC) 07/05/2021   Peripheral arterial disease (HCC) 03/17/2021   Onychomycosis 02/28/2021   Right leg pain 07/26/2020   Onychodystrophy 07/26/2020   CKD (chronic kidney disease), stage IV (HCC) 07/19/2020   Hypertensive heart and renal disease 03/22/2020   Gout 12/07/2017   DM type 2 (diabetes mellitus, type 2) (HCC) 02/11/2014   History of coronary artery bypass graft 07/01/2010   MYOCARDIAL INFARCTION, INFERIOR WALL, SUBSEQUENT CARE 05/12/2008   CAD, NATIVE VESSEL 05/12/2008   Hyperlipidemia 05/11/2008   Essential hypertension 05/11/2008   CAD, UNSPECIFIED SITE 05/11/2008   CEREBROVASCULAR DISEASE 05/11/2008    Rheumatoid arthritis (HCC) 05/11/2008    Orientation RESPIRATION BLADDER Height & Weight     Self, Time, Situation, Place  Normal Continent Weight: 56.7 kg Height:  4' 11 (149.9 cm)  BEHAVIORAL SYMPTOMS/MOOD NEUROLOGICAL BOWEL NUTRITION STATUS     (n/a) Continent Diet (Carb modified)  AMBULATORY STATUS COMMUNICATION OF NEEDS Skin   Extensive Assist Verbally Other (Comment) (dry and intact)                       Personal Care Assistance Level of Assistance  Bathing, Feeding, Dressing Bathing Assistance: Maximum assistance Feeding assistance: Limited assistance Dressing Assistance: Maximum assistance     Functional Limitations Info  Sight, Hearing, Speech Sight Info: Impaired Hearing Info: Impaired Speech Info: Adequate    SPECIAL CARE FACTORS FREQUENCY  PT (By licensed PT), OT (By licensed OT)     PT Frequency: 5x/wk OT Frequency: 5x/wk            Contractures Contractures Info: Not present    Additional Factors Info  Code Status, Allergies, Psychotropic, Insulin  Sliding Scale, Isolation Precautions Code Status Info: DNR Allergies Info: Cyclobenzaprine , Tramadol  Psychotropic Info: see discharge summary Insulin  Sliding Scale Info: see discharge summary Isolation Precautions Info: n/a     Current Medications (08/07/2023):  This is the current hospital active medication list Current Facility-Administered Medications  Medication Dose Route Frequency Provider Last Rate Last Admin   0.9 %  sodium chloride  infusion   Intravenous Continuous Rai, Ripudeep K, MD 100 mL/hr at 08/07/23 0629 New Bag at 08/07/23 0629   acetaminophen  (TYLENOL ) tablet 650 mg  650 mg Oral  Q6H PRN Gaylin Ke, MD   650 mg at 08/06/23 2108   Or   acetaminophen  (TYLENOL ) suppository 650 mg  650 mg Rectal Q6H PRN Jannette Mend, Mir M, MD       albuterol (PROVENTIL) (2.5 MG/3ML) 0.083% nebulizer solution 2.5 mg  2.5 mg Nebulization Q2H PRN Jannette Mend, Mir M, MD       amLODipine  (NORVASC )  tablet 10 mg  10 mg Oral Daily Ikramullah, Mir M, MD   10 mg at 08/07/23 8657   carvedilol  (COREG ) tablet 6.25 mg  6.25 mg Oral BID Jannette Mend, Mir M, MD   6.25 mg at 08/07/23 8469   cefTRIAXone  (ROCEPHIN ) 1 g in sodium chloride  0.9 % 100 mL IVPB  1 g Intravenous Q24H Rai, Ripudeep K, MD       cholecalciferol (VITAMIN D3) 25 MCG (1000 UNIT) tablet 2,000 Units  2,000 Units Oral Daily Rai, Ripudeep K, MD   2,000 Units at 08/07/23 1430   clopidogrel  (PLAVIX ) tablet 75 mg  75 mg Oral Daily Jannette Mend, Mir M, MD   75 mg at 08/07/23 6295   fluticasone  (FLONASE ) 50 MCG/ACT nasal spray 1 spray  1 spray Each Nare Daily PRN Gaylin Ke, MD       heparin  injection 5,000 Units  5,000 Units Subcutaneous Q8H Ikramullah, Mir M, MD   5,000 Units at 08/07/23 1432   HYDROcodone -acetaminophen  (NORCO/VICODIN) 5-325 MG per tablet 1 tablet  1 tablet Oral Q12H PRN Rai, Ripudeep K, MD   1 tablet at 08/07/23 1019   hydroxychloroquine (PLAQUENIL) tablet 200 mg  200 mg Oral Daily Rai, Ripudeep K, MD       insulin  aspart (novoLOG ) injection 0-15 Units  0-15 Units Subcutaneous TID WC Jannette Mend, Mir M, MD   2 Units at 08/07/23 1433   insulin  aspart (novoLOG ) injection 0-5 Units  0-5 Units Subcutaneous QHS Jannette Mend, Mir M, MD       loratadine  (CLARITIN ) tablet 10 mg  10 mg Oral Daily PRN Jannette Mend, Mir M, MD       methocarbamol (ROBAXIN) tablet 500 mg  500 mg Oral QHS Rai, Ripudeep K, MD       mirtazapine (REMERON) tablet 7.5 mg  7.5 mg Oral QHS Rai, Ripudeep K, MD       ondansetron  (ZOFRAN ) tablet 4 mg  4 mg Oral Q6H PRN Jannette Mend, Mir M, MD       Or   ondansetron  (ZOFRAN ) injection 4 mg  4 mg Intravenous Q6H PRN Jannette Mend, Mir M, MD   4 mg at 08/07/23 0601   Oral care mouth rinse  15 mL Mouth Rinse PRN Jannette Mend, Mir M, MD       pantoprazole  (PROTONIX ) EC tablet 40 mg  40 mg Oral Daily Rai, Ripudeep K, MD   40 mg at 08/07/23 1430   polyethylene glycol (MIRALAX  / GLYCOLAX ) packet 17 g  17 g Oral Daily PRN  Jannette Mend, Mir M, MD       rosuvastatin  (CRESTOR ) tablet 10 mg  10 mg Oral QPM Jannette Mend, Mir M, MD   10 mg at 08/06/23 1613     Discharge Medications: Please see discharge summary for a list of discharge medications.  Relevant Imaging Results:  Relevant Lab Results:   Additional Information SS# [id number]  Loreda Rodriguez, RN

## 2023-08-07 NOTE — Care Management Obs Status (Signed)
 MEDICARE OBSERVATION STATUS NOTIFICATION   Patient Details  Name: [name] MRN: [id number] Date of Birth: 17-Sep-1927   Medicare Observation Status Notification Given:  Yes    Loreda Rodriguez, RN 08/07/2023, 1:15 PM

## 2023-08-07 NOTE — Progress Notes (Addendum)
 [facility]                                                                              [name], is a 89 y.o. female, DOB - 07/29/1927, [id number] Admit date - 08/06/2023    Outpatient Primary MD for the patient is Schmerge, Arzella Laurence, NP  LOS - 0  days  Chief Complaint  Patient presents with   Abdominal Pain       Brief summary   Patient is a 89 year old female with CKD stage IV, CVA on Plavix , NIDDM, RA, HTN, lives independently with assistance of her son who lives next-door presented with dehydration and AKI superimposed on CKD.  Patient reported that she has been having sciatica back pain lately and was seen in the [facility] ER on 6/16.  She has equipment at home including a hospital bed and was discharged home with plan to follow-up with PCP and orthopedics.  On the morning of admission, she had sudden onset of nausea, 2 loose BMs.  She reported that she has not been eating or drinking very much. Creatinine in ED was noted to be 2.97, was 2.7 on 08/03/2023, 2.1 on 05/17/2023.  Patient was admitted for further workup. UA positive for UTI  Assessment & Plan    Principal Problem:   Dehydration with AKI superimposed on CKD stage IV  - Baseline creatinine appears to be 2.1-2.4 however has been trending up 2.7 on 6/16 - Per patient, poor p.o. intake, diarrhea - Continue IV fluid hydration, encourage p.o. diet  FTT, poor appetite -PT OT evaluation, reportedly has DME and hospital bed at home -Will check TSH, anemia panel - Will try low-dose Remeron at night  Urinary tract infection - Back pain could be from pyelonephritis, UA positive, urine culture from the ER not available/reflexed. - Will add on urine culture and start patient on IV Rocephin   Anemia of chronic disease likely due to CKD, normocytic -Hemoglobin 8.9, no reports of any bleeding - Obtain anemia panel     CVA (cerebral vascular accident) (HCC) -Continue Plavix   Back pain, chronic  sciatica, rheumatoid arthritis -Continue pain control, Robaxin, Plaquenil     DM type 2 (diabetes mellitus, type 2) (HCC) -Continue sliding scale insulin  while inpatient -Hemoglobin A1c 6.6 on 02/12/2023    Essential hypertension -BP stable, continue amlodipine , Coreg     Hyperlipidemia -Continue statin   Estimated body mass index is 25.25 kg/m as calculated from the following:   Height as of this encounter: 4' 11 (1.499 m).   Weight as of this encounter: 56.7 kg.  Code Status: DNR DVT Prophylaxis:  heparin  injection 5,000 Units Start: 08/06/23 2200   Level of Care: Level of care: Med-Surg Family Communication: Updated patient.  Called patient's sons ([name] and Autry Legions), unable to make contact. Disposition Plan:      Remains inpatient appropriate: PT recommending SNF, TOC consulted   Procedures:    Consultants:   None  Antimicrobials:   Anti-infectives (From admission, onward)    None          Medications  amLODipine   10 mg Oral Daily   carvedilol   6.25 mg Oral  BID   clopidogrel   75 mg Oral Daily   heparin   5,000 Units Subcutaneous Q8H   insulin  aspart  0-15 Units Subcutaneous TID WC   insulin  aspart  0-5 Units Subcutaneous QHS   rosuvastatin   10 mg Oral QPM      Subjective:   [name] was seen and examined today.  Feeling a little bit better today, no acute nausea vomiting, abdominal pain or any diarrhea.  Patient denies dizziness, chest pain, shortness of breath, abdominal pain.    Objective:   Vitals:   08/06/23 1941 08/06/23 2349 08/07/23 0456 08/07/23 1028  BP: (!) 120/45 (!) 146/51 (!) 168/52 (!) 170/50  Pulse: 63 (!) 58 (!) 57 (!) 57  Resp: 17 17 17 16   Temp: 98.1 F (36.7 C) 98.4 F (36.9 C) 97.9 F (36.6 C) (!) 97.5 F (36.4 C)  TempSrc: Oral Oral Oral Oral  SpO2: 97% 98% 99% 97%  Weight:      Height:        Intake/Output Summary (Last 24 hours) at 08/07/2023 1134 Last data filed at 08/07/2023 1031 Gross per 24 hour   Intake 2485.92 ml  Output 300 ml  Net 2185.92 ml     Wt Readings from Last 3 Encounters:  08/06/23 56.7 kg  08/03/23 56.7 kg  05/13/23 57.1 kg     Exam General: Alert and oriented x 3, NAD Cardiovascular: S1 S2 auscultated,  RRR Respiratory: Clear to auscultation bilaterally Gastrointestinal: Soft, nontender, nondistended, + bowel sounds Ext: no pedal edema bilaterally Neuro: no new deficits Skin: No rashes Psych: Normal affect     Data Reviewed:  I have personally reviewed following labs    CBC Lab Results  Component Value Date   WBC 4.0 08/07/2023   RBC 2.90 (L) 08/07/2023   HGB 8.9 (L) 08/07/2023   HCT 28.2 (L) 08/07/2023   MCV 97.2 08/07/2023   MCH 30.7 08/07/2023   PLT 234 08/07/2023   MCHC 31.6 08/07/2023   RDW 13.2 08/07/2023   LYMPHSABS 0.5 (L) 08/06/2023   MONOABS 0.5 08/06/2023   EOSABS 0.1 08/06/2023   BASOSABS 0.0 08/06/2023     Last metabolic panel Lab Results  Component Value Date   NA 138 08/07/2023   K 4.0 08/07/2023   CL 107 08/07/2023   CO2 23 08/07/2023   BUN 59 (H) 08/07/2023   CREATININE 2.50 (H) 08/07/2023   GLUCOSE 86 08/07/2023   GFRNONAA 17 (L) 08/07/2023   GFRAA 28 06/25/2020   CALCIUM  8.6 (L) 08/07/2023   PHOS 4.7 (H) 05/17/2023   PROT 6.7 08/06/2023   ALBUMIN 3.5 08/06/2023   LABGLOB 2.7 07/17/2021   LABGLOB 3.0 07/17/2021   AGRATIO 1.4 07/17/2021   AGRATIO 1.2 07/17/2021   BILITOT 0.6 08/06/2023   ALKPHOS 70 08/06/2023   AST 23 08/06/2023   ALT 10 08/06/2023   ANIONGAP 8 08/07/2023    CBG (last 3)  Recent Labs    08/06/23 1633 08/06/23 2140 08/07/23 0740  GLUCAP 95 185* 79      Coagulation Profile: No results for input(s): INR, PROTIME in the last 168 hours.   Radiology Studies: I have personally reviewed the imaging studies  DG Chest Port 1 View Result Date: 08/06/2023 CLINICAL DATA:  Chest pain. EXAM: PORTABLE CHEST 1 VIEW COMPARISON:  05/12/2023. FINDINGS: Stable cardiomediastinal  silhouette. Aortic atherosclerosis. Prior median sternotomy and CABG. No focal consolidation, pleural effusion, or pneumothorax. Redemonstrated anterior dislocation of the right glenohumeral joint with remote humeral head fracture. IMPRESSION: 1.  No acute cardiopulmonary findings. 2. Redemonstrated anterior dislocation of the right glenohumeral joint with remote humeral head fracture. Electronically Signed   By: Mannie Seek M.D.   On: 08/06/2023 12:15   CT ABDOMEN PELVIS WO CONTRAST Result Date: 08/06/2023 CLINICAL DATA:  Abdominal pain, acute, nonlocalized EXAM: CT ABDOMEN AND PELVIS WITHOUT CONTRAST TECHNIQUE: Multidetector CT imaging of the abdomen and pelvis was performed following the standard protocol without IV contrast. RADIATION DOSE REDUCTION: This exam was performed according to the departmental dose-optimization program which includes automated exposure control, adjustment of the mA and/or kV according to patient size and/or use of iterative reconstruction technique. COMPARISON:  05/12/2023. FINDINGS: Lower chest: Mild bibasilar linear atelectasis/scarring. Hepatobiliary: No suspicious focal hepatic lesion identified within the limits of an unenhanced exam. Unchanged cholelithiasis. No gallbladder wall thickening or pericholecystic fluid. No biliary dilatation. Pancreas: Unremarkable Spleen: Unremarkable Adrenals/Urinary Tract: Adrenal glands are unremarkable. No urolithiasis or hydronephrosis. Bladder is unremarkable. Stomach/Bowel: Stomach is within normal limits. No evidence of obstruction or focal inflammatory changes. Appendix is not clearly identified. Sigmoid colonic diverticulosis without evidence of acute diverticulitis. Vascular/Lymphatic: Abdominal aorta is normal in caliber with atherosclerotic calcification. No enlarged abdominal or pelvic lymph nodes. Reproductive: Uterus and bilateral adnexa are unremarkable. Other: No abdominopelvic ascites. No intraperitoneal free air. Persistent  diffuse body wall edema. Musculoskeletal: Redemonstrated inferior endplate compression deformity of L1, unchanged. Multilevel degenerative changes of the lumbar spine with similar grade 1 anterolisthesis of L3 on L4. Redemonstrated severe arthritic changes of the right hip with concentric superior predominant joint space loss, bone-on-bone contact, subchondral cystic changes/marginal erosions. These findings may be secondary to inflammatory arthropathy, such as rheumatoid arthritis. Mild-to-moderate degenerative changes of the left hip. IMPRESSION: 1. No acute localizing findings in the abdomen or pelvis. 2. Sigmoid colonic diverticulosis without evidence of acute diverticulitis. 3. Cholelithiasis without evidence of acute cholecystitis. 4. Additional unchanged chronic findings, as above. 5.  Aortic Atherosclerosis (ICD10-I70.0). Electronically Signed   By: Mannie Seek M.D.   On: 08/06/2023 12:12       Alexiz Cothran M.D. [facility] 08/07/2023, 11:34 AM  Available via Epic secure chat 7am-7pm After 7 pm, please refer to night coverage provider listed on amion.

## 2023-08-07 NOTE — Plan of Care (Signed)
  Problem: Education: Goal: Ability to describe self-care measures that may prevent or decrease complications (Diabetes Survival Skills Education) will improve Outcome: Progressing   Problem: Coping: Goal: Ability to adjust to condition or change in health will improve Outcome: Progressing   Problem: Fluid Volume: Goal: Ability to maintain a balanced intake and output will improve Outcome: Progressing   Problem: Health Behavior/Discharge Planning: Goal: Ability to identify and utilize available resources and services will improve Outcome: Progressing   Problem: Metabolic: Goal: Ability to maintain appropriate glucose levels will improve Outcome: Progressing   Problem: Skin Integrity: Goal: Risk for impaired skin integrity will decrease Outcome: Progressing   Problem: Education: Goal: Knowledge of General Education information will improve Description: Including pain rating scale, medication(s)/side effects and non-pharmacologic comfort measures Outcome: Progressing   Problem: Clinical Measurements: Goal: Ability to maintain clinical measurements within normal limits will improve Outcome: Progressing

## 2023-08-07 NOTE — Evaluation (Signed)
 Occupational Therapy Evaluation Patient Details Name: [name] MRN: [id number] DOB: 26-Dec-1927 Today's Date: 08/07/2023   History of Present Illness   89 yr old female who lives independently with the assistance of her son who lives next door,  being admitted to the hospital with concern for dehydration and AKI superimposed on CKD stage IV.  PMH: CKD stage IV, CVA, non-insulin -dependent type 2 diabetes, RA, hypertension     Clinical Impressions The pt is currently presenting with the below listed deficits (see OT problem list). As such, her occupational performance is impaired and she requires assistance for self-care management. The pt reported having a lot of difficulty managing bathing & dressing tasks at home. She stated, I do the best I can. During the session today, she required mod assist for supine to sit, total assist to donn her socks seated EOB, and mod assist to stand using a Stedy device. She reported having 2 prior CVAs, with resultant weakness on each side of her body. She will benefit from further OT services to maximize her independence with self-care tasks and to decrease the risk for further weakness and deconditioning. Overall, OT anticipates the pt would do very well in an assisted living setting, as a long-term care option; this is something the pt is actually interested in, however she admits she would need someone to assist her in initiating this process. In the meantime, short-term SNF rehab is recommended; the pt indicated she has been to SNF rehab quite a few times.      If plan is discharge home, recommend the following:   Help with stairs or ramp for entrance;Assistance with cooking/housework;Assist for transportation;A lot of help with walking and/or transfers;A lot of help with bathing/dressing/bathroom     Functional Status Assessment   Patient has had a recent decline in their functional status and demonstrates the ability to make  significant improvements in function in a reasonable and predictable amount of time.     Equipment Recommendations   None recommended by OT     Recommendations for Other Services         Precautions/Restrictions   Precautions Precautions: Fall Restrictions Weight Bearing Restrictions Per Provider Order: No     Mobility Bed Mobility Overal bed mobility: Needs Assistance Bed Mobility: Supine to Sit     Supine to sit: Mod assist     General bed mobility comments: assist to progress B LE off bed, pt able to self assist trunk to upright    Transfers Overall transfer level: Needs assistance   Transfers: Sit to/from Stand, Bed to chair/wheelchair/BSC Sit to Stand: Mod assist, From elevated surface           General transfer comment: required instruction on B LE placement on the floor, hand placement on support bar, and correct of forwards flexed posture in standing Transfer via Lift Equipment: Stedy    Balance     Sitting balance-[name]: Fair       Standing balance-[name]: Poor             ADL either performed or assessed with clinical judgement   ADL Overall ADL's : Needs assistance/impaired Eating/Feeding: Set up;Sitting   Grooming: Minimal assistance;Sitting           Upper Body Dressing : Minimal assistance;Sitting   Lower Body Dressing: Maximal assistance;Sitting/lateral leans       Toileting- Clothing Manipulation and Hygiene: Maximal assistance Toileting - Clothing Manipulation Details (indicate cue type and reason): at bedside commode level,  based on clinical judgement             Vision                Pertinent Vitals/Pain Pain Assessment Pain Location: generalized with pain also attributed to sciatica Pain Intervention(s): Limited activity within patient's tolerance, Monitored during session, Repositioned     Extremity/Trunk Assessment Upper Extremity Assessment Upper Extremity Assessment: RUE  deficits/detail;LUE deficits/detail RUE Deficits / Details: Significant shoulder AROM limitations, with AROM for shoulder flexion <90 degrees. Elbow and hand AROM WFL. Functional grip strength. Chronic arthritic changes of hands.  LUE Deficits / Details: Significant shoulder AROM limitations, with AROM for shoulder flexion <90 degrees. Elbow and hand AROM WFL. Functional grip strength. Chronic arthritic changes of hands.    Lower Extremity Assessment Lower Extremity Assessment: Generalized weakness       Communication Communication Communication: No apparent difficulties Factors Affecting Communication: Difficulty expressing self (occasional difficulty completing thoughts)   Cognition Arousal: Alert Behavior During Therapy: WFL for tasks assessed/performed               OT - Cognition Comments: Oriented x4                 Following commands: Intact                  Home Living Family/patient expects to be discharged to:: Private residence Living Arrangements: Alone Available Help at Discharge: Family;Available PRN/intermittently Type of Home: House Home Access: Ramped entrance     Home Layout: Two level;Able to live on main level with bedroom/bathroom Alternate Level Stairs-Number of Steps: does not go to second level             Home Equipment: Cane - single point;Wheelchair - Stage manager (4 wheels)   Additional Comments:  (Her son lives next door and checks in on her, though he has not been able to help as much recently, as she reported he has some health issues.)      Prior Functioning/Environment Prior Level of Function : Needs assist             Mobility Comments: She reported being non-ambulatory for ~1 year. She has been using a manual wheelchair for mobility inside her home. She reported performing a squat-pivot transfer into and out of the wheelchair.  She has been to SNF rehab several times. ADLs  Comments: The pt reported having a lot of difficulty managing bathing & dressing tasks. She stated, I do the best I can. She takes spongebaths, as she's unable to transfer into & out of her shower. She gets 1 meal per day with meals on wheels & her son also helps with meals.    OT Problem List: Decreased strength;Decreased range of motion;Decreased activity tolerance;Impaired balance (sitting and/or standing);Decreased coordination;Decreased knowledge of use of DME or AE   OT Treatment/Interventions: Self-care/ADL training;Therapeutic exercise;Energy conservation;DME and/or AE instruction;Patient/family education;Therapeutic activities;Balance training      OT Goals(Current goals can be found in the care plan section)   Acute Rehab OT Goals OT Goal Formulation: With patient Time For Goal Achievement: 08/21/23 Potential to Achieve Goals: Fair ADL Goals Pt Will Perform Grooming: with set-up;sitting Pt Will Perform Upper Body Dressing: with set-up;sitting Pt Will Transfer to Toilet: with contact guard assist;bedside commode;stand pivot transfer;squat pivot transfer Additional ADL Goal #1: The pt will perform bed mobility with CGA, in prep for progressive ADL participation.   OT Frequency:  Min 2X/week  AM-PAC OT 6 Clicks Daily Activity     Outcome Measure Help from another person eating meals?: A Little Help from another person taking care of personal grooming?: A Little Help from another person toileting, which includes using toliet, bedpan, or urinal?: A Lot Help from another person bathing (including washing, rinsing, drying)?: A Lot Help from another person to put on and taking off regular upper body clothing?: A Little Help from another person to put on and taking off regular lower body clothing?: A Lot 6 Click Score: 15   End of Session Equipment Utilized During Treatment: Gait belt;Other (comment) Octaviano Belts) Nurse Communication: Mobility status  Activity Tolerance:  Other (comment) (Fair tolerance) Patient left: in chair;with call bell/phone within reach;with nursing/sitter in room  OT Visit Diagnosis: Unsteadiness on feet (R26.81);Other abnormalities of gait and mobility (R26.89);Muscle weakness (generalized) (M62.81)                Time: 1100-1140 OT Time Calculation (min): 40 min Charges:  OT General Charges $OT Visit: 1 Visit OT Evaluation $OT Eval Moderate Complexity: 1 Mod    Jory Tanguma L Jermal Dismuke, OTR/L 08/07/2023, 12:34 PM

## 2023-08-07 NOTE — TOC Initial Note (Signed)
 Transition of Care [facility]) - Initial/Assessment Note    Patient Details  Name: [name] MRN: [id number] Date of Birth: 08-31-1927  [facility] CM/SW Contact:    Loreda Rodriguez, RN Phone [phone]  08/07/2023, 3:21 PM  Clinical Narrative:                 [facility] acknowledges consult for SNF placement. Patient is from home where she lives alone. Patient states that she has gotten progressively weaker over the past few weeks and understands that she does need some rehab. Patient gives verbal consent for CM to initiate bed search. SNF workup has been initiated FL2 completed PASRR done # [id number] A patient info faxed out for bed offers.   Expected Discharge Plan: Skilled Nursing Facility Barriers to Discharge: Continued Medical Work up, No Barriers Identified   Patient Goals and CMS Choice Patient states their goals for this hospitalization and ongoing recovery are:: Wants to go to rehab to get stronger CMS Medicare.gov Compare Post Acute Care list provided to:: Patient Choice offered to / list presented to : Patient [place] ownership interest in [facility].provided to:: Patient    Expected Discharge Plan and Services In-house Referral: NA Discharge Planning Services: CM Consult Post Acute Care Choice: Skilled Nursing Facility Living arrangements for the past 2 months: Single Family Home                 DME Arranged: N/A DME Agency: NA       HH Arranged: NA HH Agency: NA        Prior Living Arrangements/Services Living arrangements for the past 2 months: Single Family Home Lives with:: Self Patient language and need for interpreter reviewed:: Yes Do you feel safe going back to the place where you live?: Yes      Need for Family Participation in Patient Care: Yes (Comment) Care giver support system in place?: Yes (comment) Current home services: DME (walker wheelchair cane) Criminal Activity/Legal Involvement Pertinent to  Current Situation/Hospitalization: No - Comment as needed  Activities of Daily Living   ADL Screening (condition at time of admission) Independently performs ADLs?: No Does the patient have a NEW difficulty with bathing/dressing/toileting/self-feeding that is expected to last >3 days?: No Does the patient have a NEW difficulty with getting in/out of bed, walking, or climbing stairs that is expected to last >3 days?: No Does the patient have a NEW difficulty with communication that is expected to last >3 days?: No Is the patient deaf or have difficulty hearing?: No Does the patient have difficulty seeing, even when wearing glasses/contacts?: No Does the patient have difficulty concentrating, remembering, or making decisions?: No  Permission Sought/Granted Permission sought to share information with : Family Supports Permission granted to share information with : Yes, Verbal Permission Granted  Share Information with NAME: [name] Evon  Permission granted to share info w AGENCY: SNF agencies  Permission granted to share info w Relationship: son  Permission granted to share info w Contact Information: [phone]  Emotional Assessment Appearance:: Appears stated age Attitude/Demeanor/Rapport: Gracious Affect (typically observed): Pleasant Orientation: : Oriented to Self, Oriented to Place, Oriented to  Time, Oriented to Situation   Psych Involvement: No (comment)  Admission diagnosis:  Dehydration [E86.0] Generalized abdominal pain [R10.84] AKI (acute kidney injury) (HCC) [N17.9] Stage 4 chronic kidney disease (HCC) [N18.4] Patient Active Problem List   Diagnosis Date Noted   Acute kidney injury superimposed on chronic kidney disease (HCC) 08/07/2023   Failure to thrive  in adult 08/07/2023   Dehydration 08/06/2023   SVC syndrome 05/12/2023   Acute CVA (cerebrovascular accident) (HCC) 02/13/2023   Anemia of chronic disease 02/12/2023   CVA (cerebral vascular accident) (HCC)  08/07/2022   History of CAD (coronary artery disease) of artery bypass graft 08/07/2022   Arthritis of right hip 08/06/2021   Unilateral primary osteoarthritis, right knee 08/06/2021   Closed compression fracture of body of L1 vertebra (HCC) 07/05/2021   Peripheral arterial disease (HCC) 03/17/2021   Onychomycosis 02/28/2021   Right leg pain 07/26/2020   Onychodystrophy 07/26/2020   CKD (chronic kidney disease), stage IV (HCC) 07/19/2020   Hypertensive heart and renal disease 03/22/2020   Gout 12/07/2017   DM type 2 (diabetes mellitus, type 2) (HCC) 02/11/2014   History of coronary artery bypass graft 07/01/2010   MYOCARDIAL INFARCTION, INFERIOR WALL, SUBSEQUENT CARE 05/12/2008   CAD, NATIVE VESSEL 05/12/2008   Hyperlipidemia 05/11/2008   Essential hypertension 05/11/2008   CAD, UNSPECIFIED SITE 05/11/2008   CEREBROVASCULAR DISEASE 05/11/2008   Rheumatoid arthritis (HCC) 05/11/2008   PCP:  Sharma Dears, NP Pharmacy:   [facility] - [place], [place] - [address] [place] [address] Phone: [phone] Fax: [phone]     Social Drivers of Health (SDOH) Social History: SDOH Screenings   Food Insecurity: No Food Insecurity (08/06/2023)  Housing: Low Risk  (08/06/2023)  Transportation Needs: No Transportation Needs (08/06/2023)  Utilities: Not At Risk (08/06/2023)  Alcohol Screen: Low Risk  (11/14/2020)  Depression (PHQ2-9): Low Risk  (03/27/2022)  Financial Resource Strain: Low Risk  (03/27/2022)  Physical Activity: Inactive (03/27/2022)  Social Connections: Moderately Isolated (08/06/2023)  Stress: No Stress Concern Present (03/27/2022)  Tobacco Use: Low Risk  (08/06/2023)   SDOH Interventions:     Readmission Risk Interventions    05/14/2023    4:08 PM  Readmission Risk Prevention Plan  Transportation Screening Complete  Medication Review (RN Care Manager) Referral to Pharmacy  PCP or Specialist appointment within 3-5  days of discharge Complete  HRI or Home Care Consult Complete  SW Recovery Care/Counseling Consult Complete  Palliative Care Screening Not Applicable  Skilled Nursing Facility Not Applicable

## 2023-08-07 NOTE — Progress Notes (Signed)
   08/07/23 1328  TOC Brief Assessment  Insurance and Status Reviewed  Patient has primary care physician Yes (Schmerge, Arzella Laurence, NP)  Home environment has been reviewed home alone  Prior level of function: Modified independent  Prior/Current Home Services No current home services  Social Drivers of Health Review SDOH reviewed needs interventions;SDOH reviewed no interventions necessary  Readmission risk has been reviewed Yes  Transition of care needs transition of care needs identified, TOC will continue to follow   Will follow for new SNF recommendation and workup

## 2023-08-08 DIAGNOSIS — N179 Acute kidney failure, unspecified: Secondary | ICD-10-CM | POA: Diagnosis not present

## 2023-08-08 DIAGNOSIS — E860 Dehydration: Secondary | ICD-10-CM | POA: Diagnosis not present

## 2023-08-08 DIAGNOSIS — N184 Chronic kidney disease, stage 4 (severe): Secondary | ICD-10-CM | POA: Diagnosis not present

## 2023-08-08 DIAGNOSIS — R1084 Generalized abdominal pain: Secondary | ICD-10-CM | POA: Diagnosis not present

## 2023-08-08 LAB — RENAL FUNCTION PANEL
Albumin: 3.1 g/dL — ABNORMAL LOW (ref 3.5–5.0)
Anion gap: 13 (ref 5–15)
BUN: 48 mg/dL — ABNORMAL HIGH (ref 8–23)
CO2: 19 mmol/L — ABNORMAL LOW (ref 22–32)
Calcium: 9.2 mg/dL (ref 8.9–10.3)
Chloride: 107 mmol/L (ref 98–111)
Creatinine, Ser: 2.12 mg/dL — ABNORMAL HIGH (ref 0.44–1.00)
GFR, Estimated: 21 mL/min — ABNORMAL LOW (ref >60)
Glucose, Bld: 117 mg/dL — ABNORMAL HIGH (ref 70–99)
Phosphorus: 3.4 mg/dL (ref 2.5–4.6)
Potassium: 4.2 mmol/L (ref 3.5–5.1)
Sodium: 139 mmol/L (ref 135–145)

## 2023-08-08 LAB — CBC
HCT: 28.7 % — ABNORMAL LOW (ref 36.0–46.0)
Hemoglobin: 9.3 g/dL — ABNORMAL LOW (ref 12.0–15.0)
MCH: 30.3 pg (ref 26.0–34.0)
MCHC: 32.4 g/dL (ref 30.0–36.0)
MCV: 93.5 fL (ref 80.0–100.0)
Platelets: 233 10*3/uL (ref 150–400)
RBC: 3.07 MIL/uL — ABNORMAL LOW (ref 3.87–5.11)
RDW: 13.3 % (ref 11.5–15.5)
WBC: 4.3 10*3/uL (ref 4.0–10.5)
nRBC: 0 % (ref 0.0–0.2)

## 2023-08-08 LAB — GLUCOSE, CAPILLARY
Glucose-Capillary: 66 mg/dL — ABNORMAL LOW (ref 70–99)
Glucose-Capillary: 111 mg/dL — ABNORMAL HIGH (ref 70–99)
Glucose-Capillary: 135 mg/dL — ABNORMAL HIGH (ref 70–99)
Glucose-Capillary: 134 mg/dL — ABNORMAL HIGH (ref 70–99)
Glucose-Capillary: 146 mg/dL — ABNORMAL HIGH (ref 70–99)

## 2023-08-08 MED ORDER — HYDROCODONE-ACETAMINOPHEN 5-325 MG PO TABS
1.0000 | ORAL_TABLET | Freq: Four times a day (QID) | ORAL | Status: DC | PRN
  Administered 2023-08-09: 1 via ORAL
  Filled 2023-08-08: qty 1

## 2023-08-08 MED ORDER — GUAIFENESIN-DM 100-10 MG/5ML PO SYRP
5.0000 mL | ORAL_SOLUTION | ORAL | Status: DC | PRN
  Filled 2023-08-08: qty 5

## 2023-08-08 MED ORDER — MELATONIN 3 MG PO TABS
3.0000 mg | ORAL_TABLET | Freq: Every day | ORAL | Status: DC
  Administered 2023-08-08 – 2023-08-09 (×2): 3 mg via ORAL
  Filled 2023-08-08 (×2): qty 1

## 2023-08-08 MED ORDER — MUSCLE RUB 10-15 % EX CREA
1.0000 | TOPICAL_CREAM | CUTANEOUS | Status: DC | PRN
  Administered 2023-08-08 – 2023-08-11 (×4): 1 via TOPICAL
  Filled 2023-08-08: qty 85

## 2023-08-08 MED ORDER — HYDRALAZINE HCL 20 MG/ML IJ SOLN
10.0000 mg | Freq: Four times a day (QID) | INTRAMUSCULAR | Status: DC | PRN
  Administered 2023-08-09 – 2023-08-10 (×3): 10 mg via INTRAVENOUS
  Filled 2023-08-08 (×3): qty 1

## 2023-08-08 NOTE — TOC Progression Note (Signed)
 Transition of Care [facility]) - Progression Note   Patient Details  Name: [name] MRN: [id number] Date of Birth: 02-Apr-1927  Transition of [facility]) CM/SW Contact  Duwaine GORMAN Aran, LCSW Phone Number: 08/08/2023, 12:03 PM  Clinical Narrative: CSW provided patient with list of bed offers and Medicare star ratings. Patient received the following bed offers:  [facility] [address] [place], [place] [address] [phone] Overall rating ??? Average  [facility] [address] [place], [place] [address] [phone] Overall rating? Much below average  [facility] [address] [place], [place] [address] [phone] Overall rating ? Much below average  [facility] [address] [place], [place] [address] [phone] Overall rating ?? Below average  [facility] [address] [place], [place] [address] [phone] Overall rating ??? Average  [facility] [address] [place], [place] [address] [phone] Overall rating ???? Above average  [facility] [address] [place], [place] [address] [phone] Overall rating ? Much below average  Patient to review bed offers. TOC awaiting bed choice.  Expected Discharge Plan: Skilled Nursing Facility Barriers to Discharge: Continued Medical Work up, No Barriers Identified  Expected Discharge Plan and Services In-house Referral: NA Discharge Planning Services: CM Consult Post Acute Care Choice: Skilled Nursing Facility Living arrangements for the past 2 months: Single Family Home            DME Arranged: N/A DME Agency: NA HH Arranged: NA HH Agency: NA  Social Determinants of Health (SDOH) Interventions SDOH Screenings   Food Insecurity: No Food Insecurity (08/06/2023)  Housing:  Low Risk  (08/06/2023)  Transportation Needs: No Transportation Needs (08/06/2023)  Utilities: Not At Risk (08/06/2023)  Alcohol Screen: Low Risk  (11/14/2020)  Depression (PHQ2-9): Low Risk  (03/27/2022)  Financial Resource Strain: Low Risk  (03/27/2022)  Physical Activity: Inactive (03/27/2022)  Social Connections: Moderately Isolated (08/06/2023)  Stress: No Stress Concern Present (03/27/2022)  Tobacco Use: Low Risk  (08/06/2023)   Readmission Risk Interventions    05/14/2023    4:08 PM  Readmission Risk Prevention Plan  Transportation Screening Complete  Medication Review (RN Care Manager) Referral to Pharmacy  PCP or Specialist appointment within 3-5 days of discharge Complete  HRI or Home Care Consult Complete  SW Recovery Care/Counseling Consult Complete  Palliative Care Screening Not Applicable  Skilled Nursing Facility Not Applicable

## 2023-08-08 NOTE — Progress Notes (Signed)
 Hypoglycemic Event  CBG: 66  Treatment: 4 oz juice/soda  Symptoms: None  Follow-up CBG: Time: 0814 CBG Result:111  Possible Reasons for Event: Inadequate meal intake    [name]

## 2023-08-08 NOTE — Plan of Care (Signed)
   Problem: Education: Goal: Ability to describe self-care measures that may prevent or decrease complications (Diabetes Survival Skills Education) will improve Outcome: Progressing Goal: Individualized Educational Video(s) Outcome: Progressing   Problem: Coping: Goal: Ability to adjust to condition or change in health will improve Outcome: Progressing

## 2023-08-08 NOTE — Plan of Care (Signed)
  Problem: Education: Goal: Ability to describe self-care measures that may prevent or decrease complications (Diabetes Survival Skills Education) will improve Outcome: Progressing Goal: Individualized Educational Video(s) Outcome: Progressing   Problem: Coping: Goal: Ability to adjust to condition or change in health will improve Outcome: Progressing   Problem: Fluid Volume: Goal: Ability to maintain a balanced intake and output will improve Outcome: Progressing   Problem: Health Behavior/Discharge Planning: Goal: Ability to identify and utilize available resources and services will improve Outcome: Progressing Goal: Ability to manage health-related needs will improve Outcome: Progressing   Problem: Nutritional: Goal: Maintenance of adequate nutrition will improve Outcome: Progressing Goal: Progress toward achieving an optimal weight will improve Outcome: Progressing   

## 2023-08-08 NOTE — Progress Notes (Signed)
 [facility]                                                                              [name], is a 89 y.o. female, DOB - [birth year], FMW:[id number] Admit date - 08/06/2023    Outpatient Primary MD for the patient is Schmerge, [name], NP  LOS - 1  days  Chief Complaint  Patient presents with   Abdominal Pain       Brief summary   Patient is a 89 year old female with CKD stage IV, CVA on Plavix , NIDDM, RA, HTN, lives independently with assistance of her son who lives next-door presented with dehydration and AKI superimposed on CKD.  Patient reported that she has been having sciatica back pain lately and was seen in the [facility] ER on 6/16.  She has equipment at home including a hospital bed and was discharged home with plan to follow-up with PCP and orthopedics.  On the morning of admission, she had sudden onset of nausea, 2 loose BMs.  She reported that she has not been eating or drinking very much. Creatinine in ED was noted to be 2.97, was 2.7 on 08/03/2023, 2.1 on 05/17/2023.  Patient was admitted for further workup. UA positive for UTI  Assessment & Plan    Principal Problem:   Dehydration with AKI superimposed on CKD stage IV  - Baseline creatinine appears to be 2.1-2.4 however has been trending up 2.7 on 6/16 - Per patient, poor p.o. intake, diarrhea - Improving, creatinine 2.1, now at baseline  FTT, poor appetite - PT evaluation recommended SNF  - TSH 2.9, anemia panel within normal limits - Continue low-dose Remeron   Urinary tract infection - Back pain could be from pyelonephritis, UA positive, urine culture from the ER not available/reflexed. - Sent another UA however still no culture available  - Continue IV Rocephin    Anemia of chronic disease likely due to CKD, normocytic - H&H stable, 9.3, appears close to baseline 8-9     CVA (cerebral vascular accident) (HCC) -Continue Plavix   Back pain, chronic sciatica, rheumatoid  arthritis -Continue pain control, Robaxin , Plaquenil      DM type 2 (diabetes mellitus, type 2) (HCC) -Continue sliding scale insulin  while inpatient -Hemoglobin A1c 6.6 on 02/12/2023    Essential hypertension -BP stable, continue amlodipine , Coreg     Hyperlipidemia -Continue statin   Estimated body mass index is 25.25 kg/m as calculated from the following:   Height as of this encounter: 4' 11 (1.499 m).   Weight as of this encounter: 56.7 kg.  Code Status: DNR DVT Prophylaxis:  heparin  injection 5,000 Units Start: 08/06/23 2200   Level of Care: Level of care: Med-Surg Family Communication: Updated patient.  Disposition Plan:      Remains inpatient appropriate: PT recommending SNF, TOC consulted   Procedures:    Consultants:   None  Antimicrobials:   Anti-infectives (From admission, onward)    Start     Dose/Rate Route Frequency Ordered Stop   08/07/23 1400  cefTRIAXone  (ROCEPHIN ) 1 g in sodium chloride  0.9 % 100 mL IVPB        1 g 200 mL/hr over 30  Minutes Intravenous Every 24 hours 08/07/23 1307     08/07/23 1230  hydroxychloroquine  (PLAQUENIL ) tablet 200 mg        200 mg Oral Daily 08/07/23 1142            Medications  amLODipine   10 mg Oral Daily   carvedilol   6.25 mg Oral BID   cholecalciferol   2,000 Units Oral Daily   clopidogrel   75 mg Oral Daily   feeding supplement (GLUCERNA SHAKE)  237 mL Oral TID BM   heparin   5,000 Units Subcutaneous Q8H   hydroxychloroquine   200 mg Oral Daily   insulin  aspart  0-15 Units Subcutaneous TID WC   insulin  aspart  0-5 Units Subcutaneous QHS   methocarbamol   500 mg Oral QHS   mirtazapine   7.5 mg Oral QHS   pantoprazole   40 mg Oral Daily   rosuvastatin   10 mg Oral QPM      Subjective:   [name] was seen and examined today.  Feeling better today, BP readings are elevated.  No acute nausea vomiting, chest pain or shortness of breath.  States having some joint pains and arthritis  Objective:    Vitals:   08/07/23 1759 08/07/23 2034 08/08/23 0444 08/08/23 1322  BP: (!) 170/56 (!) 157/55 (!) 169/64 (!) 163/60  Pulse: (!) 54 (!) 58 65 66  Resp: 15 18 18 18   Temp: 98 F (36.7 C) 97.6 F (36.4 C) 98 F (36.7 C) 98.4 F (36.9 C)  TempSrc:  Oral Oral Oral  SpO2: 96% 98% 96% 98%  Weight:      Height:        Intake/Output Summary (Last 24 hours) at 08/08/2023 1351 Last data filed at 08/07/2023 1605 Gross per 24 hour  Intake 924.72 ml  Output --  Net 924.72 ml     Wt Readings from Last 3 Encounters:  08/06/23 56.7 kg  08/03/23 56.7 kg  05/13/23 57.1 kg    Physical Exam General: Alert and oriented x 3, NAD Cardiovascular: S1 S2 clear, RRR.  Respiratory: CTAB Gastrointestinal: Soft, nontender, nondistended, NBS Ext: no pedal edema bilaterally Neuro: no new deficits Psych: Normal affect    Data Reviewed:  I have personally reviewed following labs    CBC Lab Results  Component Value Date   WBC 4.3 08/08/2023   RBC 3.07 (L) 08/08/2023   HGB 9.3 (L) 08/08/2023   HCT 28.7 (L) 08/08/2023   MCV 93.5 08/08/2023   MCH 30.3 08/08/2023   PLT 233 08/08/2023   MCHC 32.4 08/08/2023   RDW 13.3 08/08/2023   LYMPHSABS 0.5 (L) 08/06/2023   MONOABS 0.5 08/06/2023   EOSABS 0.1 08/06/2023   BASOSABS 0.0 08/06/2023     Last metabolic panel Lab Results  Component Value Date   NA 139 08/08/2023   K 4.2 08/08/2023   CL 107 08/08/2023   CO2 19 (L) 08/08/2023   BUN 48 (H) 08/08/2023   CREATININE 2.12 (H) 08/08/2023   GLUCOSE 117 (H) 08/08/2023   GFRNONAA 21 (L) 08/08/2023   GFRAA 28 06/25/2020   CALCIUM  9.2 08/08/2023   PHOS 3.4 08/08/2023   PROT 6.7 08/06/2023   ALBUMIN 3.1 (L) 08/08/2023   LABGLOB 2.7 07/17/2021   LABGLOB 3.0 07/17/2021   AGRATIO 1.4 07/17/2021   AGRATIO 1.2 07/17/2021   BILITOT 0.6 08/06/2023   ALKPHOS 70 08/06/2023   AST 23 08/06/2023   ALT 10 08/06/2023   ANIONGAP 13 08/08/2023    CBG (last 3)  Recent Labs  08/08/23 0718  08/08/23 0814 08/08/23 1129  GLUCAP 66* 111* 135*      Coagulation Profile: No results for input(s): INR, PROTIME in the last 168 hours.   Radiology Studies: I have personally reviewed the imaging studies  No results found.      Nydia Distance M.D. [facility] 08/08/2023, 1:51 PM  Available via Epic secure chat 7am-7pm After 7 pm, please refer to night coverage provider listed on amion.

## 2023-08-09 DIAGNOSIS — N184 Chronic kidney disease, stage 4 (severe): Secondary | ICD-10-CM | POA: Diagnosis not present

## 2023-08-09 DIAGNOSIS — R1084 Generalized abdominal pain: Secondary | ICD-10-CM | POA: Diagnosis not present

## 2023-08-09 DIAGNOSIS — N179 Acute kidney failure, unspecified: Secondary | ICD-10-CM | POA: Diagnosis not present

## 2023-08-09 DIAGNOSIS — E860 Dehydration: Secondary | ICD-10-CM | POA: Diagnosis not present

## 2023-08-09 LAB — CBC
HCT: 27.1 % — ABNORMAL LOW (ref 36.0–46.0)
Hemoglobin: 9 g/dL — ABNORMAL LOW (ref 12.0–15.0)
MCH: 30.8 pg (ref 26.0–34.0)
MCHC: 33.2 g/dL (ref 30.0–36.0)
MCV: 92.8 fL (ref 80.0–100.0)
Platelets: 228 10*3/uL (ref 150–400)
RBC: 2.92 MIL/uL — ABNORMAL LOW (ref 3.87–5.11)
RDW: 13.3 % (ref 11.5–15.5)
WBC: 3.8 10*3/uL — ABNORMAL LOW (ref 4.0–10.5)
nRBC: 0 % (ref 0.0–0.2)

## 2023-08-09 LAB — RENAL FUNCTION PANEL
Albumin: 2.9 g/dL — ABNORMAL LOW (ref 3.5–5.0)
Anion gap: 6 (ref 5–15)
BUN: 44 mg/dL — ABNORMAL HIGH (ref 8–23)
CO2: 21 mmol/L — ABNORMAL LOW (ref 22–32)
Calcium: 9.1 mg/dL (ref 8.9–10.3)
Chloride: 113 mmol/L — ABNORMAL HIGH (ref 98–111)
Creatinine, Ser: 2.03 mg/dL — ABNORMAL HIGH (ref 0.44–1.00)
GFR, Estimated: 22 mL/min — ABNORMAL LOW (ref >60)
Glucose, Bld: 85 mg/dL (ref 70–99)
Phosphorus: 3.4 mg/dL (ref 2.5–4.6)
Potassium: 4.6 mmol/L (ref 3.5–5.1)
Sodium: 140 mmol/L (ref 135–145)

## 2023-08-09 LAB — GLUCOSE, CAPILLARY
Glucose-Capillary: 93 mg/dL (ref 70–99)
Glucose-Capillary: 147 mg/dL — ABNORMAL HIGH (ref 70–99)
Glucose-Capillary: 192 mg/dL — ABNORMAL HIGH (ref 70–99)
Glucose-Capillary: 137 mg/dL — ABNORMAL HIGH (ref 70–99)

## 2023-08-09 MED ORDER — ISOSORBIDE MONONITRATE ER 30 MG PO TB24
30.0000 mg | ORAL_TABLET | Freq: Every day | ORAL | Status: DC
  Administered 2023-08-09 – 2023-08-12 (×3): 30 mg via ORAL
  Filled 2023-08-09 (×3): qty 1

## 2023-08-09 MED ORDER — HYDROCODONE-ACETAMINOPHEN 5-325 MG PO TABS
1.0000 | ORAL_TABLET | Freq: Once | ORAL | Status: AC
  Administered 2023-08-09: 1 via ORAL
  Filled 2023-08-09: qty 1

## 2023-08-09 NOTE — Plan of Care (Signed)
   Problem: Education: Goal: Ability to describe self-care measures that may prevent or decrease complications (Diabetes Survival Skills Education) will improve Outcome: Progressing Goal: Individualized Educational Video(s) Outcome: Progressing   Problem: Coping: Goal: Ability to adjust to condition or change in health will improve Outcome: Progressing

## 2023-08-09 NOTE — Plan of Care (Signed)

## 2023-08-09 NOTE — Progress Notes (Signed)
 [facility]                                                                              [name], is a 89 y.o. female, DOB - 08-10-1927, FMW:[id number] Admit date - 08/06/2023    Outpatient Primary MD for the patient is Schmerge, [name], NP  LOS - 2  days  Chief Complaint  Patient presents with   Abdominal Pain       Brief summary   Patient is a 89 year old female with CKD stage IV, CVA on Plavix , NIDDM, RA, HTN, lives independently with assistance of her son who lives next-door presented with dehydration and AKI superimposed on CKD.  Patient reported that she has been having sciatica back pain lately and was seen in the [facility] ER on 6/16.  She has equipment at home including a hospital bed and was discharged home with plan to follow-up with PCP and orthopedics.  On the morning of admission, she had sudden onset of nausea, 2 loose BMs.  She reported that she has not been eating or drinking very much. Creatinine in ED was noted to be 2.97, was 2.7 on 08/03/2023, 2.1 on 05/17/2023.  Patient was admitted for further workup. UA positive for UTI  Assessment & Plan    Principal Problem:   Dehydration with AKI superimposed on CKD stage IV  - Baseline creatinine appears to be 2.1-2.4 however has been trending up 2.7 on 6/16 - Per patient, poor p.o. intake, diarrhea - Creatinine 2.0, at baseline  FTT, poor appetite - PT evaluation recommended SNF  - TSH 2.9, anemia panel within normal limits - Continue low-dose Remeron , appetite improving  Urinary tract infection - Back pain could be from pyelonephritis, UA positive, urine culture from the ER not available/reflexed. - Sent another UA however still no culture available  - Continue IV Rocephin    Anemia of chronic disease likely due to CKD, normocytic - baseline 8-9 - H&H stable    CVA (cerebral vascular accident) (HCC) -Continue Plavix   Back pain, chronic sciatica, rheumatoid arthritis -Continue pain  control, Robaxin , Plaquenil      DM type 2 (diabetes mellitus, type 2) (HCC) -Continue sliding scale insulin  while inpatient -Hemoglobin A1c 6.6 on 02/12/2023    Essential hypertension - BP readings elevated, continue amlodipine  10 mg daily, Coreg  6.25 mg twice daily, added Imdur 30 mg daily    Hyperlipidemia -Continue statin   Estimated body mass index is 25.25 kg/m as calculated from the following:   Height as of this encounter: 4' 11 (1.499 m).   Weight as of this encounter: 56.7 kg.  Code Status: DNR DVT Prophylaxis:  heparin  injection 5,000 Units Start: 08/06/23 2200   Level of Care: Level of care: Med-Surg Family Communication: Updated patient.  Disposition Plan:      Remains inpatient appropriate: Medically stable, awaiting SNF   Procedures:    Consultants:   None  Antimicrobials:   Anti-infectives (From admission, onward)    Start     Dose/Rate Route Frequency Ordered Stop   08/07/23 1400  cefTRIAXone  (ROCEPHIN ) 1 g in sodium chloride  0.9 % 100 mL IVPB  1 g 200 mL/hr over 30 Minutes Intravenous Every 24 hours 08/07/23 1307     08/07/23 1230  hydroxychloroquine  (PLAQUENIL ) tablet 200 mg        200 mg Oral Daily 08/07/23 1142            Medications  amLODipine   10 mg Oral Daily   carvedilol   6.25 mg Oral BID   cholecalciferol   2,000 Units Oral Daily   clopidogrel   75 mg Oral Daily   feeding supplement (GLUCERNA SHAKE)  237 mL Oral TID BM   heparin   5,000 Units Subcutaneous Q8H   hydroxychloroquine   200 mg Oral Daily   insulin  aspart  0-15 Units Subcutaneous TID WC   insulin  aspart  0-5 Units Subcutaneous QHS   melatonin  3 mg Oral QHS   methocarbamol   500 mg Oral QHS   mirtazapine   7.5 mg Oral QHS   pantoprazole   40 mg Oral Daily   rosuvastatin   10 mg Oral QPM      Subjective:   [name] was seen and examined today.  No acute complaints, feeling better.  BP readings elevated.  Awaiting SNF.   Objective:   Vitals:    08/08/23 0444 08/08/23 1322 08/08/23 2114 08/09/23 0634  BP: (!) 169/64 (!) 163/60 (!) 177/68 (!) 178/51  Pulse: 65 66 79 62  Resp: 18 18 16 14   Temp: 98 F (36.7 C) 98.4 F (36.9 C) 98.3 F (36.8 C) 99 F (37.2 C)  TempSrc: Oral Oral Oral   SpO2: 96% 98% 100% 100%  Weight:      Height:        Intake/Output Summary (Last 24 hours) at 08/09/2023 1301 Last data filed at 08/09/2023 1000 Gross per 24 hour  Intake 240 ml  Output 600 ml  Net -360 ml     Wt Readings from Last 3 Encounters:  08/06/23 56.7 kg  08/03/23 56.7 kg  05/13/23 57.1 kg   Physical Exam General: Alert and oriented x 3, NAD Cardiovascular: S1 S2 clear, RRR.  Respiratory: CTAB Gastrointestinal: Soft, nontender, nondistended, NBS Ext: no pedal edema bilaterally Neuro: no new deficits Psych: Normal affect     Data Reviewed:  I have personally reviewed following labs    CBC Lab Results  Component Value Date   WBC 3.8 (L) 08/09/2023   RBC 2.92 (L) 08/09/2023   HGB 9.0 (L) 08/09/2023   HCT 27.1 (L) 08/09/2023   MCV 92.8 08/09/2023   MCH 30.8 08/09/2023   PLT 228 08/09/2023   MCHC 33.2 08/09/2023   RDW 13.3 08/09/2023   LYMPHSABS 0.5 (L) 08/06/2023   MONOABS 0.5 08/06/2023   EOSABS 0.1 08/06/2023   BASOSABS 0.0 08/06/2023     Last metabolic panel Lab Results  Component Value Date   NA 140 08/09/2023   K 4.6 08/09/2023   CL 113 (H) 08/09/2023   CO2 21 (L) 08/09/2023   BUN 44 (H) 08/09/2023   CREATININE 2.03 (H) 08/09/2023   GLUCOSE 85 08/09/2023   GFRNONAA 22 (L) 08/09/2023   GFRAA 28 06/25/2020   CALCIUM  9.1 08/09/2023   PHOS 3.4 08/09/2023   PROT 6.7 08/06/2023   ALBUMIN 2.9 (L) 08/09/2023   LABGLOB 2.7 07/17/2021   LABGLOB 3.0 07/17/2021   AGRATIO 1.4 07/17/2021   AGRATIO 1.2 07/17/2021   BILITOT 0.6 08/06/2023   ALKPHOS 70 08/06/2023   AST 23 08/06/2023   ALT 10 08/06/2023   ANIONGAP 6 08/09/2023    CBG (last 3)  Recent Labs  08/08/23 2116 08/09/23 0742  08/09/23 1126  GLUCAP 146* 93 147*      Coagulation Profile: No results for input(s): INR, PROTIME in the last 168 hours.   Radiology Studies: I have personally reviewed the imaging studies  No results found.      Nydia Distance M.D. [facility] 08/09/2023, 1:01 PM  Available via Epic secure chat 7am-7pm After 7 pm, please refer to night coverage provider listed on amion.

## 2023-08-09 NOTE — Progress Notes (Signed)
 Mobility Specialist - Progress Note   08/09/23 0845  Mobility  Activity Dangled on edge of bed  Level of Assistance Moderate assist, patient does 50-74%  Assistive Device Front wheel walker  Range of Motion/Exercises Active Assistive  Activity Response Tolerated well  Mobility Referral Yes  Mobility visit 1 Mobility  Mobility Specialist Start Time (ACUTE ONLY) 0830  Mobility Specialist Stop Time (ACUTE ONLY) 0845  Mobility Specialist Time Calculation (min) (ACUTE ONLY) 15 min   Pt was found in bed and agreeable to mobilize. Sat EOB and preformed the exercises below. At EOS returned to bed with all needs met. Call bell in reach and bed alarm on.  Seated BLE Exercises: 10 reps each  1) Toe Raise/ Heel Raise  2) Knee Extension (hold 3 seconds)  3) Marching    4) Hip Adduction (pillow squeezes)   [name],  Mobility Specialist Can be reached via Secure Chat

## 2023-08-10 ENCOUNTER — Inpatient Hospital Stay (HOSPITAL_COMMUNITY)

## 2023-08-10 DIAGNOSIS — N179 Acute kidney failure, unspecified: Secondary | ICD-10-CM | POA: Diagnosis not present

## 2023-08-10 DIAGNOSIS — E860 Dehydration: Secondary | ICD-10-CM | POA: Diagnosis not present

## 2023-08-10 DIAGNOSIS — N184 Chronic kidney disease, stage 4 (severe): Secondary | ICD-10-CM | POA: Diagnosis not present

## 2023-08-10 DIAGNOSIS — R1084 Generalized abdominal pain: Secondary | ICD-10-CM | POA: Diagnosis not present

## 2023-08-10 LAB — RENAL FUNCTION PANEL
Albumin: 2.6 g/dL — ABNORMAL LOW (ref 3.5–5.0)
Anion gap: 7 (ref 5–15)
BUN: 48 mg/dL — ABNORMAL HIGH (ref 8–23)
CO2: 23 mmol/L (ref 22–32)
Calcium: 8.8 mg/dL — ABNORMAL LOW (ref 8.9–10.3)
Chloride: 107 mmol/L (ref 98–111)
Creatinine, Ser: 2.67 mg/dL — ABNORMAL HIGH (ref 0.44–1.00)
GFR, Estimated: 16 mL/min — ABNORMAL LOW (ref >60)
Glucose, Bld: 101 mg/dL — ABNORMAL HIGH (ref 70–99)
Phosphorus: 3.7 mg/dL (ref 2.5–4.6)
Potassium: 5.4 mmol/L — ABNORMAL HIGH (ref 3.5–5.1)
Sodium: 137 mmol/L (ref 135–145)

## 2023-08-10 LAB — BLOOD GAS, ARTERIAL
Acid-base deficit: 0.8 mmol/L (ref 0.0–2.0)
Bicarbonate: 24.3 mmol/L (ref 20.0–28.0)
Drawn by: 20012
FIO2: 21 %
O2 Saturation: 95.6 %
Patient temperature: 36.6
pCO2 arterial: 40 mmHg (ref 32–48)
pH, Arterial: 7.39 (ref 7.35–7.45)
pO2, Arterial: 66 mmHg — ABNORMAL LOW (ref 83–108)

## 2023-08-10 LAB — BASIC METABOLIC PANEL WITH GFR
Anion gap: 10 (ref 5–15)
BUN: 49 mg/dL — ABNORMAL HIGH (ref 8–23)
CO2: 21 mmol/L — ABNORMAL LOW (ref 22–32)
Calcium: 8.8 mg/dL — ABNORMAL LOW (ref 8.9–10.3)
Chloride: 103 mmol/L (ref 98–111)
Creatinine, Ser: 2.46 mg/dL — ABNORMAL HIGH (ref 0.44–1.00)
GFR, Estimated: 18 mL/min — ABNORMAL LOW (ref >60)
Glucose, Bld: 99 mg/dL (ref 70–99)
Potassium: 5.1 mmol/L (ref 3.5–5.1)
Sodium: 134 mmol/L — ABNORMAL LOW (ref 135–145)

## 2023-08-10 LAB — HEPATIC FUNCTION PANEL
ALT: 10 U/L (ref 0–44)
AST: 19 U/L (ref 15–41)
Albumin: 2.6 g/dL — ABNORMAL LOW (ref 3.5–5.0)
Alkaline Phosphatase: 62 U/L (ref 38–126)
Bilirubin, Direct: <0.1 mg/dL (ref 0.0–0.2)
Total Bilirubin: 0.5 mg/dL (ref 0.0–1.2)
Total Protein: 5.4 g/dL — ABNORMAL LOW (ref 6.5–8.1)

## 2023-08-10 LAB — AMMONIA: Ammonia: 25 umol/L (ref 9–35)

## 2023-08-10 LAB — GLUCOSE, CAPILLARY
Glucose-Capillary: 141 mg/dL — ABNORMAL HIGH (ref 70–99)
Glucose-Capillary: 102 mg/dL — ABNORMAL HIGH (ref 70–99)
Glucose-Capillary: 140 mg/dL — ABNORMAL HIGH (ref 70–99)
Glucose-Capillary: 106 mg/dL — ABNORMAL HIGH (ref 70–99)
Glucose-Capillary: 109 mg/dL — ABNORMAL HIGH (ref 70–99)
Glucose-Capillary: 129 mg/dL — ABNORMAL HIGH (ref 70–99)

## 2023-08-10 LAB — CBC
HCT: 26.2 % — ABNORMAL LOW (ref 36.0–46.0)
Hemoglobin: 8.3 g/dL — ABNORMAL LOW (ref 12.0–15.0)
MCH: 30.4 pg (ref 26.0–34.0)
MCHC: 31.7 g/dL (ref 30.0–36.0)
MCV: 96 fL (ref 80.0–100.0)
Platelets: 225 10*3/uL (ref 150–400)
RBC: 2.73 MIL/uL — ABNORMAL LOW (ref 3.87–5.11)
RDW: 13.7 % (ref 11.5–15.5)
WBC: 4.3 10*3/uL (ref 4.0–10.5)
nRBC: 0 % (ref 0.0–0.2)

## 2023-08-10 LAB — POTASSIUM
Potassium: 5 mmol/L (ref 3.5–5.1)
Potassium: 5.1 mmol/L (ref 3.5–5.1)

## 2023-08-10 LAB — MRSA NEXT GEN BY PCR, NASAL: MRSA by PCR Next Gen: NOT DETECTED

## 2023-08-10 MED ORDER — DEXTROSE 50 % IV SOLN
1.0000 | Freq: Once | INTRAVENOUS | Status: AC
  Administered 2023-08-10: 50 mL via INTRAVENOUS
  Filled 2023-08-10: qty 50

## 2023-08-10 MED ORDER — LABETALOL HCL 5 MG/ML IV SOLN
20.0000 mg | Freq: Once | INTRAVENOUS | Status: AC
  Administered 2023-08-10: 20 mg via INTRAVENOUS
  Filled 2023-08-10: qty 4

## 2023-08-10 MED ORDER — SALINE SPRAY 0.65 % NA SOLN
1.0000 | NASAL | Status: DC | PRN
  Administered 2023-08-10: 1 via NASAL
  Filled 2023-08-10: qty 44

## 2023-08-10 MED ORDER — LACTATED RINGERS IV SOLN: INTRAVENOUS | Status: AC

## 2023-08-10 MED ORDER — MELATONIN 3 MG PO TABS: 3.0000 mg | ORAL_TABLET | Freq: Every evening | ORAL | Status: DC | PRN

## 2023-08-10 MED ORDER — INSULIN ASPART 100 UNIT/ML IV SOLN
5.0000 [IU] | Freq: Once | INTRAVENOUS | Status: AC
  Administered 2023-08-10: 5 [IU] via INTRAVENOUS

## 2023-08-10 MED ORDER — NALOXONE HCL 0.4 MG/ML IJ SOLN: 0.1000 mg | INTRAMUSCULAR | Status: DC | PRN

## 2023-08-10 MED ORDER — CALCIUM GLUCONATE-NACL 1-0.675 GM/50ML-% IV SOLN
1.0000 g | Freq: Once | INTRAVENOUS | Status: AC
  Administered 2023-08-10: 1000 mg via INTRAVENOUS
  Filled 2023-08-10: qty 50

## 2023-08-10 MED ORDER — CHLORHEXIDINE GLUCONATE CLOTH 2 % EX PADS
6.0000 | MEDICATED_PAD | Freq: Every day | CUTANEOUS | Status: DC
  Administered 2023-08-10 – 2023-08-11 (×2): 6 via TOPICAL

## 2023-08-10 MED ORDER — ORAL CARE MOUTH RINSE: 15.0000 mL | OROMUCOSAL | Status: DC | PRN

## 2023-08-10 MED ORDER — SODIUM ZIRCONIUM CYCLOSILICATE 10 G PO PACK
10.0000 g | PACK | Freq: Once | ORAL | Status: DC
  Filled 2023-08-10: qty 1

## 2023-08-10 MED ORDER — CARMEX CLASSIC LIP BALM EX OINT: TOPICAL_OINTMENT | CUTANEOUS | Status: DC | PRN

## 2023-08-10 MED ORDER — PROCHLORPERAZINE EDISYLATE 10 MG/2ML IJ SOLN
5.0000 mg | Freq: Once | INTRAMUSCULAR | Status: AC
  Administered 2023-08-10: 5 mg via INTRAVENOUS
  Filled 2023-08-10: qty 2

## 2023-08-10 NOTE — Plan of Care (Signed)
  Problem: Coping: Goal: Ability to adjust to condition or change in health will improve Outcome: Progressing   Problem: Fluid Volume: Goal: Ability to maintain a balanced intake and output will improve Outcome: Progressing   Problem: Metabolic: Goal: Ability to maintain appropriate glucose levels will improve Outcome: Progressing   Problem: Nutritional: Goal: Progress toward achieving an optimal weight will improve Outcome: Progressing   Problem: Skin Integrity: Goal: Risk for impaired skin integrity will decrease Outcome: Progressing   Problem: Tissue Perfusion: Goal: Adequacy of tissue perfusion will improve Outcome: Progressing   Problem: Education: Goal: Knowledge of General Education information will improve Description: Including pain rating scale, medication(s)/side effects and non-pharmacologic comfort measures Outcome: Progressing   Problem: Health Behavior/Discharge Planning: Goal: Ability to manage health-related needs will improve Outcome: Progressing   Problem: Clinical Measurements: Goal: Ability to maintain clinical measurements within normal limits will improve Outcome: Progressing Goal: Will remain free from infection Outcome: Progressing Goal: Diagnostic test results will improve Outcome: Progressing Goal: Respiratory complications will improve Outcome: Progressing Goal: Cardiovascular complication will be avoided Outcome: Progressing   Problem: Activity: Goal: Risk for activity intolerance will decrease Outcome: Progressing   Problem: Coping: Goal: Level of anxiety will decrease Outcome: Progressing   Problem: Elimination: Goal: Will not experience complications related to bowel motility Outcome: Progressing Goal: Will not experience complications related to urinary retention Outcome: Progressing   Problem: Pain Managment: Goal: General experience of comfort will improve and/or be controlled Outcome: Progressing   Problem:  Safety: Goal: Ability to remain free from injury will improve Outcome: Progressing   Problem: Skin Integrity: Goal: Risk for impaired skin integrity will decrease Outcome: Progressing

## 2023-08-10 NOTE — Progress Notes (Signed)
 Orthopedic Tech Progress Note Patient Details:  [name] 1927/12/17 [id number]  Ortho Devices Type of Ortho Device: Sling immobilizer Ortho Device/Splint Location: RUE Ortho Device/Splint Interventions: Ordered, Application, Adjustment   Post Interventions Patient Tolerated: Well Instructions Provided: Care of device  [place] A [place] 08/10/2023, 11:45 AM

## 2023-08-10 NOTE — Progress Notes (Signed)
 [facility]                                                                              [name], is a 89 y.o. female, DOB - 1927/11/11, FMW:[id number] Admit date - 08/06/2023    Outpatient Primary MD for the patient is Schmerge, [name], NP  LOS - 3  days  Chief Complaint  Patient presents with   Abdominal Pain       Brief summary   Patient is a 89 year old female with CKD stage IV, CVA on Plavix , NIDDM, RA, HTN, lives independently with assistance of her son who lives next-door presented with dehydration and AKI superimposed on CKD.  Patient reported that she has been having sciatica back pain lately and was seen in the [facility] ER on 6/16.  She has equipment at home including a hospital bed and was discharged home with plan to follow-up with PCP and orthopedics.  On the morning of admission, she had sudden onset of nausea, 2 loose BMs.  She reported that she has not been eating or drinking very much. Creatinine in ED was noted to be 2.97, was 2.7 on 08/03/2023, 2.1 on 05/17/2023.  Patient was admitted for further workup. UA positive for UTI  Assessment & Plan    Acute metabolic encephalopathy -Likely multifactorial secondary to Norco, Remeron , melatonin, Robaxin .   -Hold all the above meds - ABG completed, no hypercarbia, ammonia level normal, already on IV antibiotics for UTI.  Vital signs stable -Received Narcan x 2, little more alert and awake but not following commands - Obtain stat CT head to rule out any CVA     Dehydration with AKI superimposed on CKD stage IV  - Baseline creatinine appears to be 2.1-2.4 however has been trending up 2.7 on 6/16 - Per patient, poor p.o. intake, diarrhea - Creatinine was improving, now worsened to 2.67 today, placed on IV fluids until patient is alert enough and tolerating diet  FTT, poor appetite - PT evaluation recommended SNF  - TSH 2.9, anemia panel within normal limits - Due to sedated mental status today,  discontinued primidone  Urinary tract infection - Back pain could be from pyelonephritis, UA positive, urine culture from the ER not available/reflexed. - Sent another UA however still no culture available  - Continue IV Rocephin    Hyperkalemia - Potassium 5.4, ordered IV calcium  gluconate, IV insulin /D50, Lokelma  Anemia of chronic disease likely due to CKD, normocytic - baseline 8-9 - H&H stable    CVA (cerebral vascular accident) (HCC) -Continue Plavix   Back pain, chronic sciatica, rheumatoid arthritis - Continue Plaquenil      DM type 2 (diabetes mellitus, type 2) (HCC) -Continue sliding scale insulin  while inpatient -Hemoglobin A1c 6.6 on 02/12/2023    Essential hypertension - BP now more stable, continue amlodipine  10 mg daily, Coreg  6.25 mg twice daily, Imdur was added on 6/22    Hyperlipidemia -Continue statin   Estimated body mass index is 24.49 kg/m as calculated from the following:   Height as of this encounter: 4' 11 (1.499 m).   Weight as of this encounter: 55 kg.  Code Status: DNR DVT  Prophylaxis:  heparin  injection 5,000 Units Start: 08/06/23 2200   Level of Care: Level of care: Stepdown Family Communication:  Disposition Plan:      Remains inpatient appropriate: Patient more somnolent, not following commands, transferred to higher level of care   Procedures:    Consultants:   None  Antimicrobials:   Anti-infectives (From admission, onward)    Start     Dose/Rate Route Frequency Ordered Stop   08/07/23 1400  cefTRIAXone  (ROCEPHIN ) 1 g in sodium chloride  0.9 % 100 mL IVPB        1 g 200 mL/hr over 30 Minutes Intravenous Every 24 hours 08/07/23 1307     08/07/23 1230  hydroxychloroquine  (PLAQUENIL ) tablet 200 mg        200 mg Oral Daily 08/07/23 1142            Medications  amLODipine   10 mg Oral Daily   carvedilol   6.25 mg Oral BID   Chlorhexidine Gluconate Cloth  6 each Topical Daily   cholecalciferol   2,000 Units Oral Daily    clopidogrel   75 mg Oral Daily   feeding supplement (GLUCERNA SHAKE)  237 mL Oral TID BM   heparin   5,000 Units Subcutaneous Q8H   hydroxychloroquine   200 mg Oral Daily   insulin  aspart  0-15 Units Subcutaneous TID WC   insulin  aspart  0-5 Units Subcutaneous QHS   isosorbide mononitrate  30 mg Oral Daily   methocarbamol   500 mg Oral QHS   pantoprazole   40 mg Oral Daily   rosuvastatin   10 mg Oral QPM   sodium zirconium cyclosilicate  10 g Oral Once      Subjective:   Towanda Wery was seen and examined today.  Seen this morning, very somnolent, not following any commands opens eyes.  Denied issues noted, received Narcan.  Objective:   Vitals:   08/10/23 1035 08/10/23 1100 08/10/23 1154 08/10/23 1200  BP:  (!) 125/44    Pulse: 60 (!) 59    Resp: (!) 22 20    Temp:   (!) 96.3 F (35.7 C) (!) 96.3 F (35.7 C)  TempSrc:   Axillary Axillary  SpO2: 99% 97%    Weight:      Height:        Intake/Output Summary (Last 24 hours) at 08/10/2023 1240 Last data filed at 08/10/2023 1109 Gross per 24 hour  Intake 1356.08 ml  Output 1025 ml  Net 331.08 ml     Wt Readings from Last 3 Encounters:  08/10/23 55 kg  08/03/23 56.7 kg  05/13/23 57.1 kg   Physical Exam General: Somnolent but arousable Cardiovascular: S1 S2 clear, RRR.  Respiratory: CTAB, no wheezing Gastrointestinal: Soft, nontender, nondistended, NBS Ext: no pedal edema bilaterally, left shoulder in sling Neuro: difficult to assess, patient is not following commands. Psych: somnolent    Data Reviewed:  I have personally reviewed following labs    CBC Lab Results  Component Value Date   WBC 4.3 08/10/2023   RBC 2.73 (L) 08/10/2023   HGB 8.3 (L) 08/10/2023   HCT 26.2 (L) 08/10/2023   MCV 96.0 08/10/2023   MCH 30.4 08/10/2023   PLT 225 08/10/2023   MCHC 31.7 08/10/2023   RDW 13.7 08/10/2023   LYMPHSABS 0.5 (L) 08/06/2023   MONOABS 0.5 08/06/2023   EOSABS 0.1 08/06/2023   BASOSABS 0.0 08/06/2023      Last metabolic panel Lab Results  Component Value Date   NA 137 08/10/2023   K 5.4 (H)  08/10/2023   CL 107 08/10/2023   CO2 23 08/10/2023   BUN 48 (H) 08/10/2023   CREATININE 2.67 (H) 08/10/2023   GLUCOSE 101 (H) 08/10/2023   GFRNONAA 16 (L) 08/10/2023   GFRAA 28 06/25/2020   CALCIUM  8.8 (L) 08/10/2023   PHOS 3.7 08/10/2023   PROT 5.4 (L) 08/10/2023   ALBUMIN 2.6 (L) 08/10/2023   LABGLOB 2.7 07/17/2021   LABGLOB 3.0 07/17/2021   AGRATIO 1.4 07/17/2021   AGRATIO 1.2 07/17/2021   BILITOT 0.5 08/10/2023   ALKPHOS 62 08/10/2023   AST 19 08/10/2023   ALT 10 08/10/2023   ANIONGAP 7 08/10/2023    CBG (last 3)  Recent Labs    08/10/23 0741 08/10/23 1034 08/10/23 1151  GLUCAP 102* 140* 106*      Coagulation Profile: No results for input(s): INR, PROTIME in the last 168 hours.   Radiology Studies: I have personally reviewed the imaging studies  No results found.      Nydia Distance M.D. [facility] 08/10/2023, 12:40 PM  Available via Epic secure chat 7am-7pm After 7 pm, please refer to night coverage provider listed on amion.

## 2023-08-10 NOTE — Plan of Care (Signed)

## 2023-08-10 NOTE — Progress Notes (Signed)
 PT Cancellation Note  Patient Details Name: [name] MRN: [id number] DOB: 1927/06/15   Cancelled Treatment:    Reason Eval/Treat Not Completed: Other (comment). Pt just transferred to ICU. Will continue to follow   [place] 08/10/2023, 11:17 AM

## 2023-08-11 ENCOUNTER — Inpatient Hospital Stay (HOSPITAL_COMMUNITY)

## 2023-08-11 DIAGNOSIS — N184 Chronic kidney disease, stage 4 (severe): Secondary | ICD-10-CM | POA: Diagnosis not present

## 2023-08-11 DIAGNOSIS — N179 Acute kidney failure, unspecified: Secondary | ICD-10-CM | POA: Diagnosis not present

## 2023-08-11 DIAGNOSIS — R609 Edema, unspecified: Secondary | ICD-10-CM

## 2023-08-11 DIAGNOSIS — R1084 Generalized abdominal pain: Secondary | ICD-10-CM | POA: Diagnosis not present

## 2023-08-11 DIAGNOSIS — E860 Dehydration: Secondary | ICD-10-CM | POA: Diagnosis not present

## 2023-08-11 LAB — RENAL FUNCTION PANEL
Albumin: 2.8 g/dL — ABNORMAL LOW (ref 3.5–5.0)
Anion gap: 7 (ref 5–15)
BUN: 50 mg/dL — ABNORMAL HIGH (ref 8–23)
CO2: 22 mmol/L (ref 22–32)
Calcium: 8.8 mg/dL — ABNORMAL LOW (ref 8.9–10.3)
Chloride: 106 mmol/L (ref 98–111)
Creatinine, Ser: 2.6 mg/dL — ABNORMAL HIGH (ref 0.44–1.00)
GFR, Estimated: 17 mL/min — ABNORMAL LOW (ref >60)
Glucose, Bld: 77 mg/dL (ref 70–99)
Phosphorus: 4.2 mg/dL (ref 2.5–4.6)
Potassium: 5.2 mmol/L — ABNORMAL HIGH (ref 3.5–5.1)
Sodium: 135 mmol/L (ref 135–145)

## 2023-08-11 LAB — CBC
HCT: 25.8 % — ABNORMAL LOW (ref 36.0–46.0)
Hemoglobin: 8.1 g/dL — ABNORMAL LOW (ref 12.0–15.0)
MCH: 29.9 pg (ref 26.0–34.0)
MCHC: 31.4 g/dL (ref 30.0–36.0)
MCV: 95.2 fL (ref 80.0–100.0)
Platelets: 225 10*3/uL (ref 150–400)
RBC: 2.71 MIL/uL — ABNORMAL LOW (ref 3.87–5.11)
RDW: 14.4 % (ref 11.5–15.5)
WBC: 4.5 10*3/uL (ref 4.0–10.5)
nRBC: 0 % (ref 0.0–0.2)

## 2023-08-11 LAB — GLUCOSE, CAPILLARY
Glucose-Capillary: 87 mg/dL (ref 70–99)
Glucose-Capillary: 124 mg/dL — ABNORMAL HIGH (ref 70–99)
Glucose-Capillary: 119 mg/dL — ABNORMAL HIGH (ref 70–99)
Glucose-Capillary: 139 mg/dL — ABNORMAL HIGH (ref 70–99)

## 2023-08-11 LAB — POTASSIUM: Potassium: 5.2 mmol/L — ABNORMAL HIGH (ref 3.5–5.1)

## 2023-08-11 MED ORDER — SODIUM ZIRCONIUM CYCLOSILICATE 10 G PO PACK
10.0000 g | PACK | Freq: Once | ORAL | Status: AC
  Administered 2023-08-11: 10 g via ORAL

## 2023-08-11 NOTE — Progress Notes (Signed)
 Report given. Patient's belongings collected (Bag, clothes, cellphone, grabber device, book, ballons & flowers). Patient stable at time of transfer.

## 2023-08-11 NOTE — Progress Notes (Signed)
 Right upper extremity venous  has been completed. Refer to [facility] under chart review to view preliminary results.   08/11/2023  9:39 AM [name], Ricka BIRCH

## 2023-08-11 NOTE — Progress Notes (Signed)
 [facility]                                                                              [name], is a 89 y.o. female, DOB - 12-11-1927, FMW:[id number] Admit date - 08/06/2023    Outpatient Primary MD for the patient is Schmerge, [name], NP  LOS - 4  days  Chief Complaint  Patient presents with   Abdominal Pain       Brief summary   Patient is a 89 year old female with CKD stage IV, CVA on Plavix , NIDDM, RA, HTN, lives independently with assistance of her son who lives next-door presented with dehydration and AKI superimposed on CKD.  Patient reported that she has been having sciatica back pain lately and was seen in the [facility] ER on 6/16.  She has equipment at home including a hospital bed and was discharged home with plan to follow-up with PCP and orthopedics.  On the morning of admission, she had sudden onset of nausea, 2 loose BMs.  She reported that she has not been eating or drinking very much. Creatinine in ED was noted to be 2.97, was 2.7 on 08/03/2023, 2.1 on 05/17/2023.  Patient was admitted for further workup. UA positive for UTI  Assessment & Plan    Acute metabolic encephalopathy - On 08/10/23, patient was noted likely very somnolent, not following any commands, multifactorial secondary to Norco, Remeron , melatonin, Robaxin .  Received Narcan x 2 - ABG showed no hypercarbia, ammonia level normal, already on IV antibiotics for UTI.   - CT head showed no acute CVA - Much more alert and oriented today, just complaining of weakness     Dehydration with AKI superimposed on CKD stage IV  - Baseline creatinine appears to be 2.1-2.4 however has been trending up 2.7 on 6/16, poor p.o. intake, diarrhea PTA - Creatinine was improving, now worsened to 2.6, receiving gentle hydration    FTT, poor appetite - PT evaluation recommended SNF  - TSH 2.9, anemia panel within normal limits - Due to sedation, discontinued Remeron   Urinary tract infection - Back  pain could be from pyelonephritis, UA positive, urine culture from the ER not available/reflexed. - Sent another UA however still no culture available  - Continue IV Rocephin    Hyperkalemia - Received IV calcium  gluconate, IV insulin /D50 on 6/23 - K 5.2,  Lokelma 10 g x 1  Right upper arm edema - Likely due to IV infiltration, venous Dopplers negative for SVT or DVT  Anemia of chronic disease likely due to CKD, normocytic - baseline 8-9 - H&H stable, within baseline    CVA (cerebral vascular accident) (HCC) -Continue Plavix   Back pain, chronic sciatica, rheumatoid arthritis - Continue Plaquenil      DM type 2 (diabetes mellitus, type 2) (HCC) -Continue sliding scale insulin  while inpatient -Hemoglobin A1c 6.6 on 02/12/2023    Essential hypertension - Continue amlodipine , Coreg , Imdur    Hyperlipidemia -Continue statin   Estimated body mass index is 24.49 kg/m as calculated from the following:   Height as of this encounter: 4' 11 (1.499 m).   Weight as of this encounter: 55 kg.  Code  Status: DNR DVT Prophylaxis:  heparin  injection 5,000 Units Start: 08/06/23 2200   Level of Care: Level of care: Stepdown Family Communication:  Disposition Plan:      Remains inpatient appropriate: Much more alert and oriented today, transfer to telemetry floor   Procedures:    Consultants:   None  Antimicrobials:   Anti-infectives (From admission, onward)    Start     Dose/Rate Route Frequency Ordered Stop   08/07/23 1400  cefTRIAXone  (ROCEPHIN ) 1 g in sodium chloride  0.9 % 100 mL IVPB        1 g 200 mL/hr over 30 Minutes Intravenous Every 24 hours 08/07/23 1307     08/07/23 1230  hydroxychloroquine  (PLAQUENIL ) tablet 200 mg        200 mg Oral Daily 08/07/23 1142            Medications  amLODipine   10 mg Oral Daily   carvedilol   6.25 mg Oral BID   Chlorhexidine Gluconate Cloth  6 each Topical Daily   cholecalciferol   2,000 Units Oral Daily   clopidogrel   75 mg  Oral Daily   feeding supplement (GLUCERNA SHAKE)  237 mL Oral TID BM   heparin   5,000 Units Subcutaneous Q8H   hydroxychloroquine   200 mg Oral Daily   insulin  aspart  0-15 Units Subcutaneous TID WC   insulin  aspart  0-5 Units Subcutaneous QHS   isosorbide mononitrate  30 mg Oral Daily   pantoprazole   40 mg Oral Daily   rosuvastatin   10 mg Oral QPM   sodium zirconium cyclosilicate  10 g Oral Once      Subjective:   [name] was seen and examined today.  Seen this morning, much more alert and oriented today however still complaining of generalized weakness.  Mental status improving, no acute issues overnight, no chest pain, shortness of breath, fever or chills.  Objective:   Vitals:   08/11/23 0324 08/11/23 0700 08/11/23 0800 08/11/23 0803  BP:  (!) 133/30 (!) 147/36   Pulse:  72 78   Resp:  19 20   Temp: 98.5 F (36.9 C)   99 F (37.2 C)  TempSrc: Oral   Oral  SpO2:  93% 94%   Weight:      Height:        Intake/Output Summary (Last 24 hours) at 08/11/2023 1114 Last data filed at 08/11/2023 0855 Gross per 24 hour  Intake 712.92 ml  Output 1200 ml  Net -487.08 ml     Wt Readings from Last 3 Encounters:  08/10/23 55 kg  08/03/23 56.7 kg  05/13/23 57.1 kg    Physical Exam General: Alert and oriented x 3, NAD Cardiovascular: S1 S2 clear, RRR.  Respiratory: CTAB Gastrointestinal: Soft, nontender, nondistended, NBS Ext: no pedal edema bilaterally Neuro: no new deficits, following commands, moving all 4 extremities spontaneously Psych: Normal affect      Data Reviewed:  I have personally reviewed following labs    CBC Lab Results  Component Value Date   WBC 4.5 08/11/2023   RBC 2.71 (L) 08/11/2023   HGB 8.1 (L) 08/11/2023   HCT 25.8 (L) 08/11/2023   MCV 95.2 08/11/2023   MCH 29.9 08/11/2023   PLT 225 08/11/2023   MCHC 31.4 08/11/2023   RDW 14.4 08/11/2023   LYMPHSABS 0.5 (L) 08/06/2023   MONOABS 0.5 08/06/2023   EOSABS 0.1 08/06/2023    BASOSABS 0.0 08/06/2023     Last metabolic panel Lab Results  Component Value Date  NA 135 08/11/2023   K 5.2 (H) 08/11/2023   K 5.2 (H) 08/11/2023   CL 106 08/11/2023   CO2 22 08/11/2023   BUN 50 (H) 08/11/2023   CREATININE 2.60 (H) 08/11/2023   GLUCOSE 77 08/11/2023   GFRNONAA 17 (L) 08/11/2023   GFRAA 28 06/25/2020   CALCIUM  8.8 (L) 08/11/2023   PHOS 4.2 08/11/2023   PROT 5.4 (L) 08/10/2023   ALBUMIN 2.8 (L) 08/11/2023   LABGLOB 2.7 07/17/2021   LABGLOB 3.0 07/17/2021   AGRATIO 1.4 07/17/2021   AGRATIO 1.2 07/17/2021   BILITOT 0.5 08/10/2023   ALKPHOS 62 08/10/2023   AST 19 08/10/2023   ALT 10 08/10/2023   ANIONGAP 7 08/11/2023    CBG (last 3)  Recent Labs    08/10/23 1625 08/10/23 2130 08/11/23 0801  GLUCAP 109* 129* 87      Coagulation Profile: No results for input(s): INR, PROTIME in the last 168 hours.   Radiology Studies: I have personally reviewed the imaging studies  VAS US  UPPER EXTREMITY VENOUS DUPLEX Result Date: 08/11/2023 UPPER VENOUS STUDY  Patient Name:  [name]  Date of Exam:   08/11/2023 Medical Rec #: [id number]                  Accession #:    [id number] Date of Birth: 28-Apr-1927                  Patient Gender: F Patient Age:   24 years Exam Location:  [facility] Procedure:      VAS US  UPPER EXTREMITY VENOUS DUPLEX Referring Phys: Katonya Blecher --------------------------------------------------------------------------------  Indications: Edema, and Swelling Comparison Study: 05/12/23 - Negative Performing Technologist: Ricka Sturdivant-Jones RDMS, RVT  Examination Guidelines: A complete evaluation includes B-mode imaging, spectral Doppler, color Doppler, and power Doppler as needed of all accessible portions of each vessel. Bilateral testing is considered an integral part of a complete examination. Limited examinations for reoccurring indications may be performed as noted.  Right Findings:  +----------+------------+---------+-----------+----------+-------+ RIGHT     CompressiblePhasicitySpontaneousPropertiesSummary +----------+------------+---------+-----------+----------+-------+ IJV           Full       Yes       Yes                      +----------+------------+---------+-----------+----------+-------+ Subclavian    Full       Yes       Yes                      +----------+------------+---------+-----------+----------+-------+ Axillary      Full       Yes       Yes                      +----------+------------+---------+-----------+----------+-------+ Brachial      Full                                          +----------+------------+---------+-----------+----------+-------+ Radial        Full                                          +----------+------------+---------+-----------+----------+-------+ Ulnar         Full                                          +----------+------------+---------+-----------+----------+-------+  Cephalic      Full                                          +----------+------------+---------+-----------+----------+-------+ Basilic       Full                                          +----------+------------+---------+-----------+----------+-------+  Left Findings: +----------+------------+---------+-----------+----------+-------+ LEFT      CompressiblePhasicitySpontaneousPropertiesSummary +----------+------------+---------+-----------+----------+-------+ Subclavian               Yes       Yes                      +----------+------------+---------+-----------+----------+-------+  Summary:  Right: No evidence of deep vein thrombosis in the upper extremity. No evidence of superficial vein thrombosis in the upper extremity.  Left: No evidence of thrombosis in the subclavian.  *See table(s) above for measurements and observations.    Preliminary    CT HEAD WO CONTRAST ( ) Result Date:  08/10/2023 CLINICAL DATA:  Mental status change, unknown cause EXAM: CT HEAD WITHOUT CONTRAST TECHNIQUE: Contiguous axial images were obtained from the base of the skull through the vertex without intravenous contrast. RADIATION DOSE REDUCTION: This exam was performed according to the departmental dose-optimization program which includes automated exposure control, adjustment of the mA and/or kV according to patient size and/or use of iterative reconstruction technique. COMPARISON:  MRI of the head dated February 12, 2023. FINDINGS: Brain: There is a moderate amount of generalized cerebral and cerebellar volume loss present. There is extensive cerebral white matter disease. A chronic lacunar infarct is again demonstrated at the right thalamus capsular junction. There is no evidence of hemorrhage, mass, acute cortical infarct or hydrocephalus. Vascular: Moderate calcific atheromatous disease. Skull: Intact and unremarkable. Sinuses/Orbits: Status post bilateral lens replacement. The visualized paranasal sinuses and mastoid air cells are clear. Other: Negative. IMPRESSION: 1. Age-related atrophy and advanced ischemic small vessel disease. Electronically Signed   By: Evalene Coho M.D.   On: 08/10/2023 14:21        Dieter Hane M.D. [facility] 08/11/2023, 11:14 AM  Available via Epic secure chat 7am-7pm After 7 pm, please refer to night coverage provider listed on amion.

## 2023-08-11 NOTE — Plan of Care (Signed)
  Problem: Skin Integrity: Goal: Risk for impaired skin integrity will decrease Outcome: Progressing   Problem: Tissue Perfusion: Goal: Adequacy of tissue perfusion will improve Outcome: Progressing   

## 2023-08-11 NOTE — Progress Notes (Signed)
 Occupational Therapy Treatment Patient Details Name: [name] MRN: [id number] DOB: 12/20/1927 Today's Date: 08/11/2023   History of present illness 89 yr old female being admitted to the hospital with concern for dehydration and AKI superimposed on CKD stage IV. Pt also found to have a right shoulder dislocation ( R UE sling applied). Medical history significant for CKD stage IV, CVA on Plavix , non-insulin -dependent type 2 diabetes, RA, hypertension   OT comments  The pt was motivated to participate in the session and she put forth good effort. She was seen for functional strengthening, progression of functional activity, and ADL instruction. She required max assist to stand from the chair using a Stedy device, max assist for sit to supine and min assist for self-feeding in bed. She reported having moderate B UE pain. Continue OT plan of care. Patient will benefit from continued inpatient follow up therapy, <3 hours/day.       If plan is discharge home, recommend the following:  Help with stairs or ramp for entrance;Assistance with cooking/housework;Assist for transportation;A lot of help with walking and/or transfers;A lot of help with bathing/dressing/bathroom   Equipment Recommendations  None recommended by OT    Recommendations for Other Services      Precautions / Restrictions Precautions Precautions: Fall Precaution/Restrictions Comments: right shoulder dislocation Required Braces or Orthoses: Sling       Mobility Bed Mobility Overal bed mobility: Needs Assistance Bed Mobility: Sit to Supine     Supine to sit: Max assist          Transfers Overall transfer level: Needs assistance Equipment used:  Laurent) Transfers: Sit to/from Stand, Bed to chair/wheelchair/BSC Sit to Stand: Max assist             Transfer via Lift Equipment: [employer] balance - Comments:  (static sitting-fair. dynamic sitting-poor)     Standing  balance-[name]: Poor       ADL either performed or assessed with clinical judgement   ADL Overall ADL's : Needs assistance/impaired Eating/Feeding: Minimal assistance Eating/Feeding Details (indicate cue type and reason): The pt required assist to cut food, remove lids, and open packets. She was noted to have L UE edema with decreased ability to form closed fist and hold onto standard feeding utensils. OT instructed the pt on using her R UE for feeding, emphasizing flexing her elbow to perform, in order to avoid shoulder ROM given newly found shoulder dislocation. Pt performed self-feeding tasks in the semi-fowler's position.        Communication Communication Communication: No apparent difficulties Factors Affecting Communication: Difficulty expressing self;Reduced clarity of speech   Cognition Arousal: Alert Behavior During Therapy: WFL for tasks assessed/performed                         Pertinent Vitals/ Pain       Pain Assessment Pain Assessment: 0-10 Pain Score: 5  Pain Location: left arm, right arm Pain Descriptors / Indicators: Sore, Grimacing Pain Intervention(s): Limited activity within patient's tolerance, Monitored during session, Repositioned   Frequency  Min 2X/week        Progress Toward Goals  OT Goals(current goals can now be found in the care plan section)     Acute Rehab OT Goals OT Goal Formulation: With patient Time For Goal Achievement: 08/21/23 Potential to Achieve Goals: Fair  Plan         AM-PAC OT 6 Clicks Daily Activity  Outcome Measure   Help from another person eating meals?: A Little Help from another person taking care of personal grooming?: A Little Help from another person toileting, which includes using toliet, bedpan, or urinal?: A Lot Help from another person bathing (including washing, rinsing, drying)?: A Lot Help from another person to put on and taking off regular upper body clothing?: A Lot Help from  another person to put on and taking off regular lower body clothing?: A Lot 6 Click Score: 14    End of Session Equipment Utilized During Treatment: Gait belt;Other (comment) Laurent)  OT Visit Diagnosis: Unsteadiness on feet (R26.81);Other abnormalities of gait and mobility (R26.89);Muscle weakness (generalized) (M62.81);Pain   Activity Tolerance Other (comment) (Fair tolerance)   Patient Left in bed;with call bell/phone within reach;with bed alarm set   Nurse Communication Other (comment) (Nurse cleared pt for therapy participation.)        Time: 1139-1203 OT Time Calculation (min): 24 min  Charges: OT General Charges $OT Visit: 1 Visit OT Treatments $Self Care/Home Management : 8-22 mins $Therapeutic Activity: 8-22 mins      Delanna LITTIE Molt, OTR/L 08/11/2023, 2:51 PM

## 2023-08-11 NOTE — TOC Progression Note (Addendum)
 Transition of Care [facility]) - Progression Note    Patient Details  Name: [name] MRN: [id number] Date of Birth: [birth year]  Transition of Care [facility]) CM/SW Contact  Jon ONEIDA Anon, RN Phone Number: 08/11/2023, 1:04 PM  Clinical Narrative:    NCM met with pt at bedside to discuss bed offers. Pt states she has been to [name] in the past and would like to accept the bed offer at [facility]. NCM spoke with [place] and she agrees to accept pt, stating she will have an open bed tomorrow. Will start insurance auth. TOC following.   Addendum: 1420: Heloise barrows, pending approval    Expected Discharge Plan: Skilled Nursing Facility Barriers to Discharge: Continued Medical Work up, No Barriers Identified  Expected Discharge Plan and Services In-house Referral: NA Discharge Planning Services: CM Consult Post Acute Care Choice: Skilled Nursing Facility Living arrangements for the past 2 months: Single Family Home                 DME Arranged: N/A DME Agency: NA       HH Arranged: NA HH Agency: NA         Social Determinants of Health (SDOH) Interventions SDOH Screenings   Food Insecurity: No Food Insecurity (08/06/2023)  Housing: Low Risk  (08/06/2023)  Transportation Needs: No Transportation Needs (08/06/2023)  Utilities: Not At Risk (08/06/2023)  Alcohol Screen: Low Risk  (11/14/2020)  Depression (PHQ2-9): Low Risk  (03/27/2022)  Financial Resource Strain: Low Risk  (03/27/2022)  Physical Activity: Inactive (03/27/2022)  Social Connections: Moderately Isolated (08/06/2023)  Stress: No Stress Concern Present (03/27/2022)  Tobacco Use: Low Risk  (08/06/2023)    Readmission Risk Interventions    08/08/2023   12:05 PM 05/14/2023    4:08 PM  Readmission Risk Prevention Plan  Transportation Screening Complete Complete  Medication Review (RN Care Manager) Complete Referral to Pharmacy  PCP or Specialist appointment within 3-5 days of discharge  Complete  HRI or  Home Care Consult Complete Complete  SW Recovery Care/Counseling Consult Complete Complete  Palliative Care Screening Not Applicable Not Applicable  Skilled Nursing Facility Complete Not Applicable

## 2023-08-11 NOTE — Progress Notes (Signed)
 Physical Therapy Treatment Patient Details Name: [name] MRN: [id number] DOB: 09-18-1927 Today's Date: 08/11/2023   History of Present Illness 89 y.o. female  who lives independently with the assistance of her son who lives next door,  being admitted to the hospital with concern for dehydration and AKI superimposed on CKD stage IV. Found right shoulder dislocation- sling ordered. medical history significant for CKD stage IV, CVA on Plavix , non-insulin -dependent type 2 diabetes, RA, hypertension    PT Comments  The patient is pleasant, low speech volume. Placed sling on RUE, patient indicating the LUE is painful, noted edema throughout.\ The patient required max support to sit up and squat pivot to recliner. Frequent reminders to not use the RUE. Patient will benefit from continued inpatient follow up therapy, <3 hours/day     If plan is discharge home, recommend the following: Assistance with cooking/housework;A lot of help with bathing/dressing/bathroom;Help with stairs or ramp for entrance;Assist for transportation;Two people to help with walking and/or transfers   Can travel by private vehicle        Equipment Recommendations  None recommended by PT    Recommendations for Other Services       Precautions / Restrictions Precautions Precautions: Fall Precaution/Restrictions Comments: right shoulder dislocation Required Braces or Orthoses: Sling Restrictions Weight Bearing Restrictions Per Provider Order: No     Mobility  Bed Mobility   Bed Mobility: Supine to Sit     Supine to sit: Max assist     General bed mobility comments: assist legs and trunk, quite flexed trunk    Transfers Overall transfer level: Needs assistance   Transfers: Sit to/from Stand, Bed to chair/wheelchair/BSC Sit to Stand: Max assist          Lateral/Scoot Transfers: Max assist, +2 physical assistance, +2 safety/equipment General transfer comment: max partial stand and  scoot  with bed pad to recliner.    Ambulation/Gait                   Stairs             Wheelchair Mobility     Tilt Bed    Modified Rankin (Stroke Patients Only)       Balance Overall balance assessment: Needs assistance Sitting-balance support: Feet unsupported Sitting balance-[name]: Poor Sitting balance - Comments: forward flexed   sitting                                    Communication Communication Communication: No apparent difficulties Factors Affecting Communication: Difficulty expressing self;Reduced clarity of speech  Cognition Arousal: Alert Behavior During Therapy: WFL for tasks assessed/performed   PT - Cognitive impairments: No apparent impairments, No family/caregiver present to determine baseline, Initiation                       PT - Cognition Comments: Ox3; delayed response time Following commands: Intact      Cueing Cueing Techniques: Verbal cues  Exercises      General Comments        Pertinent Vitals/Pain Pain Assessment Faces Pain Scale: Hurts little more Pain Location: left arm, right arm Pain Descriptors / Indicators: Sore, Grimacing Pain Intervention(s): Monitored during session, Repositioned    Home Living                          Prior Function  PT Goals (current goals can now be found in the care plan section) Progress towards PT goals: Progressing toward goals    Frequency    Min 2X/week      PT Plan      Co-evaluation              AM-PAC PT 6 Clicks Mobility   Outcome Measure  Help needed turning from your back to your side while in a flat bed without using bedrails?: A Lot Help needed moving from lying on your back to sitting on the side of a flat bed without using bedrails?: Total Help needed moving to and from a bed to a chair (including a wheelchair)?: Total Help needed standing up from a chair using your arms (e.g., wheelchair  or bedside chair)?: Total Help needed to walk in hospital room?: Total Help needed climbing 3-5 steps with a railing? : Total 6 Click Score: 7    End of Session Equipment Utilized During Treatment: Gait belt Activity Tolerance: Patient limited by pain;Patient tolerated treatment well Patient left: in chair;with call bell/phone within reach Nurse Communication: Mobility status;Need for lift equipment PT Visit Diagnosis: Other abnormalities of gait and mobility (R26.89);Muscle weakness (generalized) (M62.81)     Time: 1000-1019 PT Time Calculation (min) (ACUTE ONLY): 19 min  Charges:    $Therapeutic Activity: 8-22 mins PT General Charges $$ ACUTE PT VISIT: 1 Visit                    Darice Potters PT Acute Rehabilitation Services Office [phone]   Potters Darice Norris 08/11/2023, 11:51 AM

## 2023-08-12 DIAGNOSIS — E860 Dehydration: Secondary | ICD-10-CM | POA: Diagnosis not present

## 2023-08-12 LAB — RENAL FUNCTION PANEL
Albumin: 2.7 g/dL — ABNORMAL LOW (ref 3.5–5.0)
Anion gap: 9 (ref 5–15)
BUN: 49 mg/dL — ABNORMAL HIGH (ref 8–23)
CO2: 21 mmol/L — ABNORMAL LOW (ref 22–32)
Calcium: 8.6 mg/dL — ABNORMAL LOW (ref 8.9–10.3)
Chloride: 103 mmol/L (ref 98–111)
Creatinine, Ser: 2.46 mg/dL — ABNORMAL HIGH (ref 0.44–1.00)
GFR, Estimated: 18 mL/min — ABNORMAL LOW (ref >60)
Glucose, Bld: 85 mg/dL (ref 70–99)
Phosphorus: 4.1 mg/dL (ref 2.5–4.6)
Potassium: 4.8 mmol/L (ref 3.5–5.1)
Sodium: 133 mmol/L — ABNORMAL LOW (ref 135–145)

## 2023-08-12 LAB — CBC
HCT: 26.6 % — ABNORMAL LOW (ref 36.0–46.0)
Hemoglobin: 8.1 g/dL — ABNORMAL LOW (ref 12.0–15.0)
MCH: 29.8 pg (ref 26.0–34.0)
MCHC: 30.5 g/dL (ref 30.0–36.0)
MCV: 97.8 fL (ref 80.0–100.0)
Platelets: 209 10*3/uL (ref 150–400)
RBC: 2.72 MIL/uL — ABNORMAL LOW (ref 3.87–5.11)
RDW: 14.3 % (ref 11.5–15.5)
WBC: 3.9 10*3/uL — ABNORMAL LOW (ref 4.0–10.5)
nRBC: 0 % (ref 0.0–0.2)

## 2023-08-12 LAB — GLUCOSE, CAPILLARY
Glucose-Capillary: 94 mg/dL (ref 70–99)
Glucose-Capillary: 130 mg/dL — ABNORMAL HIGH (ref 70–99)

## 2023-08-12 NOTE — TOC Transition Note (Signed)
 Transition of Care [facility]) - Discharge Note  Patient Details  Name: [name] MRN: [id number] Date of Birth: [birth year]  Transition of Care [facility] CM/SW Contact:  Duwaine GORMAN Aran, LCSW Phone Number: 08/12/2023, 11:16 AM  Clinical Narrative: Patient has insurance approval for SNF. Plan auth ID # is: [id number]. Reference ID # is: [id number]. Patient approved for 08/12/2023-08/14/2023. Patient will go to room 604A and the number for report is [id number]. Discharge summary, discharge orders, and SNF transfer report faxed to facility in hub. Medical necessity form done; PTAR scheduled. Discharge packet completed. Patient and son, Norleen, notified of insurance approval and transportation. RN updated. TOC signing off.  Final next level of care: Skilled Nursing Facility Barriers to Discharge: Barriers Resolved  Patient Goals and CMS Choice Patient states their goals for this hospitalization and ongoing recovery are:: Wants to go to rehab to get stronger CMS Medicare.gov Compare Post Acute Care list provided to:: Patient Choice offered to / list presented to : Patient [place] ownership interest in [facility].provided to:: Patient   Discharge Placement Existing PASRR number confirmed : 08/07/23          Patient chooses bed at: [name] Patient to be transferred to facility by: PTAR Name of family member notified: Kaelin Holford (son) Patient and family notified of of transfer: 08/12/23  Discharge Plan and Services Additional resources added to the After Visit Summary for   In-house Referral: NA Discharge Planning Services: CM Consult Post Acute Care Choice: Skilled Nursing Facility          DME Arranged: N/A DME Agency: NA HH Arranged: NA HH Agency: NA  Social Drivers of Health (SDOH) Interventions SDOH Screenings   Food Insecurity: No Food Insecurity (08/06/2023)  Housing: Low Risk  (08/06/2023)  Transportation Needs: No Transportation Needs (08/06/2023)   Utilities: Not At Risk (08/06/2023)  Alcohol Screen: Low Risk  (11/14/2020)  Depression (PHQ2-9): Low Risk  (03/27/2022)  Financial Resource Strain: Low Risk  (03/27/2022)  Physical Activity: Inactive (03/27/2022)  Social Connections: Moderately Isolated (08/06/2023)  Stress: No Stress Concern Present (03/27/2022)  Tobacco Use: Low Risk  (08/06/2023)   Readmission Risk Interventions    08/08/2023   12:05 PM 05/14/2023    4:08 PM  Readmission Risk Prevention Plan  Transportation Screening Complete Complete  Medication Review (RN Care Manager) Complete Referral to Pharmacy  PCP or Specialist appointment within 3-5 days of discharge  Complete  HRI or Home Care Consult Complete Complete  SW Recovery Care/Counseling Consult Complete Complete  Palliative Care Screening Not Applicable Not Applicable  Skilled Nursing Facility Complete Not Applicable

## 2023-08-12 NOTE — Discharge Summary (Signed)
 Physician Discharge Summary  Billiejo Pinson Danese FMW:[id number] DOB: 05/15/1927 DOA: 08/06/2023  PCP: Ileen [name], NP  Admit date: 08/06/2023 Discharge date: 08/12/2023  Admitted From: Home Disposition:  SNF  Discharge Condition:Stable CODE STATUS:DNR Diet recommendation: Carb Modified  Brief/Interim Summary: Patient is a 89 year old female with CKD stage IV, CVA on Plavix , NIDDM, RA, HTN, lives independently with assistance of her son who lives next-door presented with dehydration and AKI superimposed on CKD.  UTI was also suspected on presentation.  Started on ceftriaxone .  Had stable hospital course, AKI has resolved and currently kidney function at baseline.  PT recommended SNF on discharge.  Medically stable for discharge today  Following problems were addressed during the hospitalization:  Acute metabolic encephalopathy - On 08/10/23, patient was noted likely very somnolent, not following any commands, multifactorial secondary to Norco, Remeron ,Robaxin .  Received Narcan x 2 - ABG showed no hypercarbia, ammonia level normal - CT head showed no acute CVA - Alert and oriented this morning.  Obeys comments       Dehydration with AKI superimposed on CKD stage IV  - Baseline creatinine appears to be 2.1-2.4, now back to baseline   FTT, poor appetite - PT evaluation recommended SNF  - TSH 2.9, anemia panel within normal limits - Due to sedation, discontinued Remeron    Urinary tract infection UA positive, urine culture from the ER not available/reflexed. - Sent another UA however still no culture available  - Finished 5 days course of ceftriaxone    Hyperkalemia - Received IV calcium  gluconate, IV insulin /D50 on 6/23 - K 5.2,  Lokelma 10 g x 1 -Potassium level stable today   Right upper arm edema - Likely due to IV infiltration, venous Dopplers negative for SVT or DVT   Anemia of chronic disease likely due to CKD, normocytic - baseline 8-9 - H&H stable, within  baseline     CVA  -Continue Plavix    Back pain, chronic sciatica, rheumatoid arthritis - Continue Plaquenil      DM type 2 -On jardiance  -Hemoglobin A1c 6.6 on 02/12/2023     Essential hypertension - Continue amlodipine , Coreg , Imdur     Hyperlipidemia -Continue statin     Discharge Diagnoses:  Principal Problem:   Dehydration Active Problems:   CVA (cerebral vascular accident) (HCC)   DM type 2 (diabetes mellitus, type 2) (HCC)   Essential hypertension   Hyperlipidemia   CKD (chronic kidney disease), stage IV (HCC)   Anemia of chronic disease   Acute kidney injury superimposed on chronic kidney disease (HCC)   Failure to thrive in adult    Discharge Instructions  Discharge Instructions     Diet Carb Modified   Complete by: As directed    Discharge instructions   Complete by: As directed    1)Please do a CBC and BMP tests in a week 2)Follow up with your PCP in 3 weeks   Increase activity slowly   Complete by: As directed       Allergies as of 08/12/2023       Reactions   Cyclobenzaprine  Other (See Comments)   Reaction not recalled by patient   Tramadol  Other (See Comments)   Hallucinations        Medication List     STOP taking these medications    amoxicillin -clavulanate 875-125 MG tablet Commonly known as: AUGMENTIN    HYDROcodone -acetaminophen  5-325 MG tablet Commonly known as: NORCO/VICODIN   methocarbamol  500 MG tablet Commonly known as: ROBAXIN    traMADol  50 MG tablet Commonly known  as: ULTRAM        TAKE these medications    Accu-Chek Aviva Plus w/Device Kit Use to check  Blood sugars up to 3 times a day.  Dx code e11.65   Accu-Chek Aviva Soln Use with accu-check aviva machine   Accu-Chek Softclix Lancets lancets Use as instructed to check blood sugars up to 3 times a day  Dx code e11.65   acetaminophen  325 MG tablet Commonly known as: TYLENOL  Take 2 tablets (650 mg total) by mouth every 6 (six) hours as needed for  mild pain (pain score 1-3), moderate pain (pain score 4-6), fever or headache (or temp > 37.5 C (99.5 F)).   amLODipine  10 MG tablet Commonly known as: NORVASC  Take 1 tablet (10 mg total) by mouth daily.   carvedilol  6.25 MG tablet Commonly known as: Coreg  Take 1 tablet (6.25 mg total) by mouth 2 (two) times daily.   cetirizine 10 MG tablet Commonly known as: ZYRTEC Take 10 mg by mouth daily.   chlorthalidone  25 MG tablet Commonly known as: HYGROTON  Take 12.5 mg by mouth daily.   clopidogrel  75 MG tablet Commonly known as: PLAVIX  Take 1 tablet (75 mg total) by mouth daily.   fluticasone  50 MCG/ACT nasal spray Commonly known as: FLONASE  Place 2 sprays into both nostrils See admin instructions. Instill 2 sprays into both nostrils in the morning as needed for allergies or rhinitis   FreeStyle Libre 2 Sensor Misc Inject 1 Device into the skin every 14 (fourteen) days.   glucose blood test strip Use as instructed to check blood sugars up to 3 times a day.  Dx code e11.65   hydroxychloroquine  200 MG tablet Commonly known as: PLAQUENIL  Take 200 mg by mouth daily.   Jardiance  10 MG Tabs tablet Generic drug: empagliflozin  Take 10 mg by mouth daily.   lidocaine  5 % Commonly known as: LIDODERM  Place 1 patch onto the skin every 12 (twelve) hours as needed (pain).   loratadine  10 MG tablet Commonly known as: CLARITIN  Take 10 mg by mouth daily as needed for allergies or rhinitis.   melatonin 3 MG Tabs tablet Take 3 mg by mouth at bedtime.   nitroGLYCERIN  0.4 MG SL tablet Commonly known as: NITROSTAT  PLACE 1 TABLET UNDER THE TONGUE AT THE FIRST SIGN OF ATTACK, MAY REPEAT EVERY 5 MINUTES FOR 3 DOSES IN 15 MINUTES. IF PAIN PERSISTS CALL 911 What changed:  how much to take how to take this when to take this additional instructions   NON FORMULARY Take 1 tablet by mouth See admin instructions. Century Mature 0.4 mg-300 mcg-250 mcg tablet- Take 1 tablet by mouth in the  morning with breakfast   ondansetron  4 MG disintegrating tablet Commonly known as: ZOFRAN -ODT Take 4 mg by mouth every 8 (eight) hours as needed.   pantoprazole  40 MG tablet Commonly known as: PROTONIX  Take 1 tablet (40 mg total) by mouth daily. What changed:  when to take this reasons to take this   polyethylene glycol 17 g packet Commonly known as: MIRALAX  / GLYCOLAX  Take 17 g by mouth daily as needed for mild constipation.   rosuvastatin  10 MG tablet Commonly known as: CRESTOR  Take 1 tablet (10 mg total) by mouth daily. What changed: when to take this   Vitamin D3 50 MCG (2000 UT) Tabs Take 2,000 Units by mouth daily.        Contact information for follow-up providers     Schmerge, Rosaline B, NP Follow up in 3 week(s).  Specialty: Nurse Practitioner Contact information: [address] [place] [address] [phone]              Contact information for after-discharge care     Destination     [name] .   Service: Skilled Nursing Contact information: [address] [place]  [address] [phone]                    Allergies  Allergen Reactions   Cyclobenzaprine  Other (See Comments)    Reaction not recalled by patient   Tramadol  Other (See Comments)    Hallucinations     Consultations: None   Procedures/Studies: VAS US  UPPER EXTREMITY VENOUS DUPLEX Result Date: 08/11/2023 UPPER VENOUS STUDY  Patient Name:  Janayla DORATHA MCSWAIN  Date of Exam:   08/11/2023 Medical Rec #: [id number]                  Accession #:    [id number] Date of Birth: 01/15/1928                  Patient Gender: F Patient Age:   55 years Exam Location:  [facility] Procedure:      VAS US  UPPER EXTREMITY VENOUS DUPLEX Referring Phys: RIPUDEEP RAI --------------------------------------------------------------------------------  Indications: Edema, and Swelling Comparison Study: 05/12/23 - Negative Performing  Technologist: Ricka Sturdivant-Jones RDMS, RVT  Examination Guidelines: A complete evaluation includes B-mode imaging, spectral Doppler, color Doppler, and power Doppler as needed of all accessible portions of each vessel. Bilateral testing is considered an integral part of a complete examination. Limited examinations for reoccurring indications may be performed as noted.  Right Findings: +----------+------------+---------+-----------+----------+-------+ RIGHT     CompressiblePhasicitySpontaneousPropertiesSummary +----------+------------+---------+-----------+----------+-------+ IJV           Full       Yes       Yes                      +----------+------------+---------+-----------+----------+-------+ Subclavian    Full       Yes       Yes                      +----------+------------+---------+-----------+----------+-------+ Axillary      Full       Yes       Yes                      +----------+------------+---------+-----------+----------+-------+ Brachial      Full                                          +----------+------------+---------+-----------+----------+-------+ Radial        Full                                          +----------+------------+---------+-----------+----------+-------+ Ulnar         Full                                          +----------+------------+---------+-----------+----------+-------+ Cephalic      Full                                          +----------+------------+---------+-----------+----------+-------+  Basilic       Full                                          +----------+------------+---------+-----------+----------+-------+  Left Findings: +----------+------------+---------+-----------+----------+-------+ LEFT      CompressiblePhasicitySpontaneousPropertiesSummary +----------+------------+---------+-----------+----------+-------+ Subclavian               Yes       Yes                       +----------+------------+---------+-----------+----------+-------+  Summary:  Right: No evidence of deep vein thrombosis in the upper extremity. No evidence of superficial vein thrombosis in the upper extremity.  Left: No evidence of thrombosis in the subclavian.  *See table(s) above for measurements and observations.  Diagnosing physician: Penne Colorado MD Electronically signed by Penne Colorado MD on 08/11/2023 at 5:02:39 PM.    Final    CT HEAD WO CONTRAST ( ) Result Date: 08/10/2023 CLINICAL DATA:  Mental status change, unknown cause EXAM: CT HEAD WITHOUT CONTRAST TECHNIQUE: Contiguous axial images were obtained from the base of the skull through the vertex without intravenous contrast. RADIATION DOSE REDUCTION: This exam was performed according to the departmental dose-optimization program which includes automated exposure control, adjustment of the mA and/or kV according to patient size and/or use of iterative reconstruction technique. COMPARISON:  MRI of the head dated February 12, 2023. FINDINGS: Brain: There is a moderate amount of generalized cerebral and cerebellar volume loss present. There is extensive cerebral white matter disease. A chronic lacunar infarct is again demonstrated at the right thalamus capsular junction. There is no evidence of hemorrhage, mass, acute cortical infarct or hydrocephalus. Vascular: Moderate calcific atheromatous disease. Skull: Intact and unremarkable. Sinuses/Orbits: Status post bilateral lens replacement. The visualized paranasal sinuses and mastoid air cells are clear. Other: Negative. IMPRESSION: 1. Age-related atrophy and advanced ischemic small vessel disease. Electronically Signed   By: Evalene Coho M.D.   On: 08/10/2023 14:21   DG Chest Port 1 View Result Date: 08/06/2023 CLINICAL DATA:  Chest pain. EXAM: PORTABLE CHEST 1 VIEW COMPARISON:  05/12/2023. FINDINGS: Stable cardiomediastinal silhouette. Aortic atherosclerosis. Prior median sternotomy and CABG. No  focal consolidation, pleural effusion, or pneumothorax. Redemonstrated anterior dislocation of the right glenohumeral joint with remote humeral head fracture. IMPRESSION: 1. No acute cardiopulmonary findings. 2. Redemonstrated anterior dislocation of the right glenohumeral joint with remote humeral head fracture. Electronically Signed   By: Harrietta Sherry M.D.   On: 08/06/2023 12:15   CT ABDOMEN PELVIS WO CONTRAST Result Date: 08/06/2023 CLINICAL DATA:  Abdominal pain, acute, nonlocalized EXAM: CT ABDOMEN AND PELVIS WITHOUT CONTRAST TECHNIQUE: Multidetector CT imaging of the abdomen and pelvis was performed following the standard protocol without IV contrast. RADIATION DOSE REDUCTION: This exam was performed according to the departmental dose-optimization program which includes automated exposure control, adjustment of the mA and/or kV according to patient size and/or use of iterative reconstruction technique. COMPARISON:  05/12/2023. FINDINGS: Lower chest: Mild bibasilar linear atelectasis/scarring. Hepatobiliary: No suspicious focal hepatic lesion identified within the limits of an unenhanced exam. Unchanged cholelithiasis. No gallbladder wall thickening or pericholecystic fluid. No biliary dilatation. Pancreas: Unremarkable Spleen: Unremarkable Adrenals/Urinary Tract: Adrenal glands are unremarkable. No urolithiasis or hydronephrosis. Bladder is unremarkable. Stomach/Bowel: Stomach is within normal limits. No evidence of obstruction or focal inflammatory changes. Appendix is not clearly identified. Sigmoid colonic diverticulosis without evidence of acute diverticulitis. Vascular/Lymphatic:  Abdominal aorta is normal in caliber with atherosclerotic calcification. No enlarged abdominal or pelvic lymph nodes. Reproductive: Uterus and bilateral adnexa are unremarkable. Other: No abdominopelvic ascites. No intraperitoneal free air. Persistent diffuse body wall edema. Musculoskeletal: Redemonstrated inferior  endplate compression deformity of L1, unchanged. Multilevel degenerative changes of the lumbar spine with similar grade 1 anterolisthesis of L3 on L4. Redemonstrated severe arthritic changes of the right hip with concentric superior predominant joint space loss, bone-on-bone contact, subchondral cystic changes/marginal erosions. These findings may be secondary to inflammatory arthropathy, such as rheumatoid arthritis. Mild-to-moderate degenerative changes of the left hip. IMPRESSION: 1. No acute localizing findings in the abdomen or pelvis. 2. Sigmoid colonic diverticulosis without evidence of acute diverticulitis. 3. Cholelithiasis without evidence of acute cholecystitis. 4. Additional unchanged chronic findings, as above. 5.  Aortic Atherosclerosis (ICD10-I70.0). Electronically Signed   By: Harrietta Sherry M.D.   On: 08/06/2023 12:12      Subjective: Patient seen and examined the bedside today.  Comfortable, lying on the bed.  Alert ,oriented this morning.  Denies any new complaints.  Medically stable for discharge.I called and discussed the discharge planning with daughter-in-law Amra Shukla on phone   Discharge Exam: Vitals:   08/11/23 2302 08/12/23 0259  BP: (!) 157/54 (!) 153/62  Pulse: 66 68  Resp:    Temp: 98.2 F (36.8 C) 97.7 F (36.5 C)  SpO2: 98% 97%   Vitals:   08/11/23 1838 08/11/23 2144 08/11/23 2302 08/12/23 0259  BP: (!) 167/58 (!) 172/56 (!) 157/54 (!) 153/62  Pulse: 73 72 66 68  Resp: 18 19    Temp: 98.2 F (36.8 C) 98.4 F (36.9 C) 98.2 F (36.8 C) 97.7 F (36.5 C)  TempSrc: Oral Oral Oral Oral  SpO2: 100% 100% 98% 97%  Weight:      Height:        General: Pt is alert, awake, not in acute distress Cardiovascular: RRR, S1/S2 +, no rubs, no gallops Respiratory: CTA bilaterally, no wheezing, no rhonchi Abdominal: Soft, NT, ND, bowel sounds + Extremities: no edema, no cyanosis    The results of significant diagnostics from this hospitalization (including  imaging, microbiology, ancillary and laboratory) are listed below for reference.     Microbiology: Recent Results (from the past 240 hours)  MRSA Next Gen by PCR, Nasal     Status: None   Collection Time: 08/10/23 10:43 AM   Specimen: Nasal Mucosa; Nasal Swab  Result Value Ref Range Status   MRSA by PCR Next Gen NOT DETECTED NOT DETECTED Final    Comment: (NOTE) The GeneXpert MRSA Assay (FDA approved for NASAL specimens only), is one component of a comprehensive MRSA colonization surveillance program. It is not intended to diagnose MRSA infection nor to guide or monitor treatment for MRSA infections. Test performance is not FDA approved in patients less than 72 years old. Performed at [facility], [address], [place], [place] [address]      Labs: BNP (last 3 results) No results for input(s): BNP in the last 8760 hours. Basic Metabolic Panel: Recent Labs  Lab 08/08/23 0839 08/09/23 0721 08/10/23 0658 08/10/23 1252 08/10/23 1737 08/10/23 2034 08/11/23 0158 08/12/23 0409  NA 139 140 137 134*  --   --  135 133*  K 4.2 4.6 5.4* 5.1 5.0 5.1 5.2*  5.2* 4.8  CL 107 113* 107 103  --   --  106 103  CO2 19* 21* 23 21*  --   --  22  21*  GLUCOSE 117* 85 101* 99  --   --  77 85  BUN 48* 44* 48* 49*  --   --  50* 49*  CREATININE 2.12* 2.03* 2.67* 2.46*  --   --  2.60* 2.46*  CALCIUM  9.2 9.1 8.8* 8.8*  --   --  8.8* 8.6*  PHOS 3.4 3.4 3.7  --   --   --  4.2 4.1   Liver Function Tests: Recent Labs  Lab 08/06/23 1117 08/08/23 0839 08/09/23 0721 08/10/23 0658 08/10/23 0737 08/11/23 0158 08/12/23 0409  AST 23  --   --   --  19  --   --   ALT 10  --   --   --  10  --   --   ALKPHOS 70  --   --   --  62  --   --   BILITOT 0.6  --   --   --  0.5  --   --   PROT 6.7  --   --   --  5.4*  --   --   ALBUMIN 3.5   < > 2.9* 2.6* 2.6* 2.8* 2.7*   < > = values in this interval not displayed.   Recent Labs  Lab 08/06/23 1117  LIPASE 38   Recent Labs   Lab 08/10/23 0748  AMMONIA 25   CBC: Recent Labs  Lab 08/06/23 1117 08/07/23 0606 08/08/23 0839 08/09/23 0721 08/10/23 0658 08/11/23 0823 08/12/23 0409  WBC 4.9   < > 4.3 3.8* 4.3 4.5 3.9*  NEUTROABS 3.7  --   --   --   --   --   --   HGB 9.7*   < > 9.3* 9.0* 8.3* 8.1* 8.1*  HCT 30.8*   < > 28.7* 27.1* 26.2* 25.8* 26.6*  MCV 95.1   < > 93.5 92.8 96.0 95.2 97.8  PLT 265   < > 233 228 225 225 209   < > = values in this interval not displayed.   Cardiac Enzymes: No results for input(s): CKTOTAL, CKMB, CKMBINDEX, TROPONINI in the last 168 hours. BNP: Invalid input(s): POCBNP CBG: Recent Labs  Lab 08/11/23 0801 08/11/23 1135 08/11/23 1607 08/11/23 2048 08/12/23 0756  GLUCAP 87 124* 119* 139* 94   D-Dimer No results for input(s): DDIMER in the last 72 hours. Hgb A1c No results for input(s): HGBA1C in the last 72 hours. Lipid Profile No results for input(s): CHOL, HDL, LDLCALC, TRIG, CHOLHDL, LDLDIRECT in the last 72 hours. Thyroid  function studies No results for input(s): TSH, T4TOTAL, T3FREE, THYROIDAB in the last 72 hours.  Invalid input(s): FREET3 Anemia work up No results for input(s): VITAMINB12, FOLATE, FERRITIN, TIBC, IRON , RETICCTPCT in the last 72 hours. Urinalysis    Component Value Date/Time   COLORURINE YELLOW 08/07/2023 1559   APPEARANCEUR CLOUDY (A) 08/07/2023 1559   LABSPEC 1.012 08/07/2023 1559   PHURINE 5.0 08/07/2023 1559   GLUCOSEU 150 (A) 08/07/2023 1559   HGBUR NEGATIVE 08/07/2023 1559   BILIRUBINUR NEGATIVE 08/07/2023 1559   BILIRUBINUR negative 11/14/2020 1608   KETONESUR NEGATIVE 08/07/2023 1559   PROTEINUR 30 (A) 08/07/2023 1559   UROBILINOGEN 0.2 11/14/2020 1608   UROBILINOGEN 0.2 04/26/2009 0014   NITRITE NEGATIVE 08/07/2023 1559   LEUKOCYTESUR LARGE (A) 08/07/2023 1559   Sepsis Labs Recent Labs  Lab 08/09/23 0721 08/10/23 0658 08/11/23 0823 08/12/23 0409  WBC 3.8* 4.3 4.5  3.9*   Microbiology Recent Results (from the past 240  hours)  MRSA Next Gen by PCR, Nasal     Status: None   Collection Time: 08/10/23 10:43 AM   Specimen: Nasal Mucosa; Nasal Swab  Result Value Ref Range Status   MRSA by PCR Next Gen NOT DETECTED NOT DETECTED Final    Comment: (NOTE) The GeneXpert MRSA Assay (FDA approved for NASAL specimens only), is one component of a comprehensive MRSA colonization surveillance program. It is not intended to diagnose MRSA infection nor to guide or monitor treatment for MRSA infections. Test performance is not FDA approved in patients less than 53 years old. Performed at [facility], [address], [place], [place] [address]     Please note: You were cared for by a hospitalist during your hospital stay. Once you are discharged, your primary care physician will handle any further medical issues. Please note that NO REFILLS for any discharge medications will be authorized once you are discharged, as it is imperative that you return to your primary care physician (or establish a relationship with a primary care physician if you do not have one) for your post hospital discharge needs so that they can reassess your need for medications and monitor your lab values.    Time coordinating discharge: 40 minutes  SIGNED:   Ivonne Mustache, MD  [facility] 08/12/2023, 10:13 AM Pager [phone]  If 7PM-7AM, please contact night-coverage [email or web address] Password [email or web address]

## 2023-08-17 DIAGNOSIS — K219 Gastro-esophageal reflux disease without esophagitis: Secondary | ICD-10-CM | POA: Diagnosis not present

## 2023-08-17 DIAGNOSIS — E119 Type 2 diabetes mellitus without complications: Secondary | ICD-10-CM | POA: Diagnosis not present

## 2023-08-17 DIAGNOSIS — I10 Essential (primary) hypertension: Secondary | ICD-10-CM | POA: Diagnosis not present

## 2023-08-17 DIAGNOSIS — E785 Hyperlipidemia, unspecified: Secondary | ICD-10-CM | POA: Diagnosis not present

## 2023-08-19 DIAGNOSIS — R52 Pain, unspecified: Secondary | ICD-10-CM | POA: Diagnosis not present

## 2023-09-07 ENCOUNTER — Inpatient Hospital Stay (HOSPITAL_COMMUNITY)
Admission: EM | Admit: 2023-09-07 | Discharge: 2023-09-18 | DRG: 291 | Disposition: A | Discharge status: Skilled Nursing Facility | Attending: Internal Medicine | Admitting: Internal Medicine

## 2023-09-07 ENCOUNTER — Emergency Department (HOSPITAL_COMMUNITY)

## 2023-09-07 ENCOUNTER — Other Ambulatory Visit: Payer: Self-pay

## 2023-09-07 ENCOUNTER — Encounter (HOSPITAL_COMMUNITY): Payer: Self-pay

## 2023-09-07 DIAGNOSIS — E1151 Type 2 diabetes mellitus with diabetic peripheral angiopathy without gangrene: Secondary | ICD-10-CM | POA: Diagnosis present

## 2023-09-07 DIAGNOSIS — Z8673 Personal history of transient ischemic attack (TIA), and cerebral infarction without residual deficits: Secondary | ICD-10-CM

## 2023-09-07 DIAGNOSIS — M069 Rheumatoid arthritis, unspecified: Secondary | ICD-10-CM | POA: Diagnosis present

## 2023-09-07 DIAGNOSIS — B961 Klebsiella pneumoniae [K. pneumoniae] as the cause of diseases classified elsewhere: Secondary | ICD-10-CM | POA: Diagnosis present

## 2023-09-07 DIAGNOSIS — E8809 Other disorders of plasma-protein metabolism, not elsewhere classified: Secondary | ICD-10-CM | POA: Diagnosis present

## 2023-09-07 DIAGNOSIS — Z79899 Other long term (current) drug therapy: Secondary | ICD-10-CM

## 2023-09-07 DIAGNOSIS — M109 Gout, unspecified: Secondary | ICD-10-CM | POA: Diagnosis present

## 2023-09-07 DIAGNOSIS — E1169 Type 2 diabetes mellitus with other specified complication: Secondary | ICD-10-CM | POA: Diagnosis present

## 2023-09-07 DIAGNOSIS — I5033 Acute on chronic diastolic (congestive) heart failure: Secondary | ICD-10-CM | POA: Diagnosis present

## 2023-09-07 DIAGNOSIS — M24411 Recurrent dislocation, right shoulder: Secondary | ICD-10-CM | POA: Diagnosis present

## 2023-09-07 DIAGNOSIS — R0602 Shortness of breath: Secondary | ICD-10-CM | POA: Diagnosis not present

## 2023-09-07 DIAGNOSIS — N184 Chronic kidney disease, stage 4 (severe): Secondary | ICD-10-CM | POA: Diagnosis present

## 2023-09-07 DIAGNOSIS — M65842 Other synovitis and tenosynovitis, left hand: Secondary | ICD-10-CM | POA: Diagnosis present

## 2023-09-07 DIAGNOSIS — D631 Anemia in chronic kidney disease: Secondary | ICD-10-CM | POA: Diagnosis present

## 2023-09-07 DIAGNOSIS — I25810 Atherosclerosis of coronary artery bypass graft(s) without angina pectoris: Secondary | ICD-10-CM | POA: Diagnosis present

## 2023-09-07 DIAGNOSIS — E785 Hyperlipidemia, unspecified: Secondary | ICD-10-CM | POA: Diagnosis present

## 2023-09-07 DIAGNOSIS — Z66 Do not resuscitate: Secondary | ICD-10-CM | POA: Diagnosis present

## 2023-09-07 DIAGNOSIS — Z951 Presence of aortocoronary bypass graft: Secondary | ICD-10-CM

## 2023-09-07 DIAGNOSIS — Z888 Allergy status to other drugs, medicaments and biological substances status: Secondary | ICD-10-CM

## 2023-09-07 DIAGNOSIS — D638 Anemia in other chronic diseases classified elsewhere: Secondary | ICD-10-CM | POA: Diagnosis present

## 2023-09-07 DIAGNOSIS — E11649 Type 2 diabetes mellitus with hypoglycemia without coma: Secondary | ICD-10-CM | POA: Diagnosis not present

## 2023-09-07 DIAGNOSIS — E1122 Type 2 diabetes mellitus with diabetic chronic kidney disease: Secondary | ICD-10-CM | POA: Diagnosis present

## 2023-09-07 DIAGNOSIS — Z993 Dependence on wheelchair: Secondary | ICD-10-CM

## 2023-09-07 DIAGNOSIS — Z800 Family history of malignant neoplasm of digestive organs: Secondary | ICD-10-CM

## 2023-09-07 DIAGNOSIS — Z1152 Encounter for screening for COVID-19: Secondary | ICD-10-CM

## 2023-09-07 DIAGNOSIS — N179 Acute kidney failure, unspecified: Secondary | ICD-10-CM | POA: Diagnosis not present

## 2023-09-07 DIAGNOSIS — I10 Essential (primary) hypertension: Secondary | ICD-10-CM | POA: Diagnosis not present

## 2023-09-07 DIAGNOSIS — Z809 Family history of malignant neoplasm, unspecified: Secondary | ICD-10-CM

## 2023-09-07 DIAGNOSIS — I5031 Acute diastolic (congestive) heart failure: Secondary | ICD-10-CM

## 2023-09-07 DIAGNOSIS — Z885 Allergy status to narcotic agent status: Secondary | ICD-10-CM

## 2023-09-07 DIAGNOSIS — I081 Rheumatic disorders of both mitral and tricuspid valves: Secondary | ICD-10-CM | POA: Diagnosis present

## 2023-09-07 DIAGNOSIS — N183 Chronic kidney disease, stage 3 unspecified: Secondary | ICD-10-CM

## 2023-09-07 DIAGNOSIS — E119 Type 2 diabetes mellitus without complications: Secondary | ICD-10-CM

## 2023-09-07 DIAGNOSIS — E872 Acidosis, unspecified: Secondary | ICD-10-CM | POA: Diagnosis present

## 2023-09-07 DIAGNOSIS — I272 Pulmonary hypertension, unspecified: Secondary | ICD-10-CM | POA: Diagnosis present

## 2023-09-07 DIAGNOSIS — N390 Urinary tract infection, site not specified: Secondary | ICD-10-CM | POA: Diagnosis present

## 2023-09-07 DIAGNOSIS — Z8249 Family history of ischemic heart disease and other diseases of the circulatory system: Secondary | ICD-10-CM

## 2023-09-07 DIAGNOSIS — R54 Age-related physical debility: Secondary | ICD-10-CM | POA: Diagnosis present

## 2023-09-07 DIAGNOSIS — R1310 Dysphagia, unspecified: Secondary | ICD-10-CM | POA: Diagnosis present

## 2023-09-07 DIAGNOSIS — Z7984 Long term (current) use of oral hypoglycemic drugs: Secondary | ICD-10-CM

## 2023-09-07 DIAGNOSIS — D509 Iron deficiency anemia, unspecified: Secondary | ICD-10-CM | POA: Diagnosis present

## 2023-09-07 DIAGNOSIS — I2510 Atherosclerotic heart disease of native coronary artery without angina pectoris: Secondary | ICD-10-CM | POA: Diagnosis present

## 2023-09-07 DIAGNOSIS — I130 Hypertensive heart and chronic kidney disease with heart failure and stage 1 through stage 4 chronic kidney disease, or unspecified chronic kidney disease: Secondary | ICD-10-CM | POA: Diagnosis not present

## 2023-09-07 DIAGNOSIS — I739 Peripheral vascular disease, unspecified: Secondary | ICD-10-CM | POA: Diagnosis present

## 2023-09-07 DIAGNOSIS — Z7902 Long term (current) use of antithrombotics/antiplatelets: Secondary | ICD-10-CM

## 2023-09-07 DIAGNOSIS — I1310 Hypertensive heart and chronic kidney disease without heart failure, with stage 1 through stage 4 chronic kidney disease, or unspecified chronic kidney disease: Secondary | ICD-10-CM | POA: Diagnosis present

## 2023-09-07 DIAGNOSIS — I509 Heart failure, unspecified: Principal | ICD-10-CM

## 2023-09-07 LAB — RESP PANEL BY RT-PCR (RSV, FLU A&B, COVID)  RVPGX2
Influenza A by PCR: NEGATIVE
Influenza B by PCR: NEGATIVE
Resp Syncytial Virus by PCR: NEGATIVE
SARS Coronavirus 2 by RT PCR: NEGATIVE

## 2023-09-07 LAB — CBC
HCT: 26.4 % — ABNORMAL LOW (ref 36.0–46.0)
Hemoglobin: 8.4 g/dL — ABNORMAL LOW (ref 12.0–15.0)
MCH: 31.3 pg (ref 26.0–34.0)
MCHC: 31.8 g/dL (ref 30.0–36.0)
MCV: 98.5 fL (ref 80.0–100.0)
Platelets: 218 K/uL (ref 150–400)
RBC: 2.68 MIL/uL — ABNORMAL LOW (ref 3.87–5.11)
RDW: 15.4 % (ref 11.5–15.5)
WBC: 3.6 K/uL — ABNORMAL LOW (ref 4.0–10.5)
nRBC: 0 % (ref 0.0–0.2)

## 2023-09-07 LAB — TROPONIN I (HIGH SENSITIVITY)
Troponin I (High Sensitivity): 80 ng/L — ABNORMAL HIGH (ref <18)
Troponin I (High Sensitivity): 95 ng/L — ABNORMAL HIGH (ref <18)

## 2023-09-07 LAB — BASIC METABOLIC PANEL WITH GFR
Anion gap: 11 (ref 5–15)
BUN: 68 mg/dL — ABNORMAL HIGH (ref 8–23)
CO2: 15 mmol/L — ABNORMAL LOW (ref 22–32)
Calcium: 9 mg/dL (ref 8.9–10.3)
Chloride: 107 mmol/L (ref 98–111)
Creatinine, Ser: 2.36 mg/dL — ABNORMAL HIGH (ref 0.44–1.00)
GFR, Estimated: 19 mL/min — ABNORMAL LOW (ref >60)
Glucose, Bld: 92 mg/dL (ref 70–99)
Potassium: 4.9 mmol/L (ref 3.5–5.1)
Sodium: 133 mmol/L — ABNORMAL LOW (ref 135–145)

## 2023-09-07 LAB — BRAIN NATRIURETIC PEPTIDE: B Natriuretic Peptide: 803.4 pg/mL — ABNORMAL HIGH (ref 0.0–100.0)

## 2023-09-07 LAB — CBG MONITORING, ED: Glucose-Capillary: 81 mg/dL (ref 70–99)

## 2023-09-07 LAB — GLUCOSE, CAPILLARY: Glucose-Capillary: 132 mg/dL — ABNORMAL HIGH (ref 70–99)

## 2023-09-07 LAB — MAGNESIUM: Magnesium: 2 mg/dL (ref 1.7–2.4)

## 2023-09-07 MED ORDER — ACETAMINOPHEN 650 MG RE SUPP: 650.0000 mg | Freq: Four times a day (QID) | RECTAL | Status: DC | PRN

## 2023-09-07 MED ORDER — MELATONIN 3 MG PO TABS
3.0000 mg | ORAL_TABLET | Freq: Every day | ORAL | Status: DC
  Administered 2023-09-07 – 2023-09-17 (×11): 3 mg via ORAL
  Filled 2023-09-07 (×11): qty 1

## 2023-09-07 MED ORDER — HEPARIN SODIUM (PORCINE) 5000 UNIT/ML IJ SOLN
5000.0000 [IU] | Freq: Three times a day (TID) | INTRAMUSCULAR | Status: DC
  Administered 2023-09-07 – 2023-09-18 (×33): 5000 [IU] via SUBCUTANEOUS
  Filled 2023-09-07 (×35): qty 1

## 2023-09-07 MED ORDER — ROSUVASTATIN CALCIUM 5 MG PO TABS
10.0000 mg | ORAL_TABLET | Freq: Every evening | ORAL | Status: DC
  Administered 2023-09-07 – 2023-09-17 (×11): 10 mg via ORAL
  Filled 2023-09-07 (×12): qty 2

## 2023-09-07 MED ORDER — FUROSEMIDE 10 MG/ML IJ SOLN
60.0000 mg | Freq: Once | INTRAMUSCULAR | Status: AC
  Administered 2023-09-07: 60 mg via INTRAVENOUS
  Filled 2023-09-07: qty 6

## 2023-09-07 MED ORDER — PANTOPRAZOLE SODIUM 40 MG PO TBEC
40.0000 mg | DELAYED_RELEASE_TABLET | Freq: Every day | ORAL | Status: DC
  Administered 2023-09-08 – 2023-09-18 (×11): 40 mg via ORAL
  Filled 2023-09-07 (×11): qty 1

## 2023-09-07 MED ORDER — HYDROXYCHLOROQUINE SULFATE 200 MG PO TABS
200.0000 mg | ORAL_TABLET | Freq: Every day | ORAL | Status: DC
  Administered 2023-09-08 – 2023-09-18 (×11): 200 mg via ORAL
  Filled 2023-09-07 (×11): qty 1

## 2023-09-07 MED ORDER — SODIUM CHLORIDE 0.9% FLUSH
3.0000 mL | Freq: Two times a day (BID) | INTRAVENOUS | Status: DC
  Administered 2023-09-07 – 2023-09-17 (×21): 3 mL via INTRAVENOUS

## 2023-09-07 MED ORDER — CARVEDILOL 6.25 MG PO TABS
6.2500 mg | ORAL_TABLET | Freq: Two times a day (BID) | ORAL | Status: DC
  Administered 2023-09-07 – 2023-09-18 (×22): 6.25 mg via ORAL
  Filled 2023-09-07 (×22): qty 1

## 2023-09-07 MED ORDER — POLYETHYLENE GLYCOL 3350 17 G PO PACK
17.0000 g | PACK | Freq: Every day | ORAL | Status: DC | PRN
Start: 2023-09-07 — End: 2023-09-18
  Administered 2023-09-08: 17 g via ORAL
  Filled 2023-09-07 (×3): qty 1

## 2023-09-07 MED ORDER — INSULIN ASPART 100 UNIT/ML IJ SOLN
0.0000 [IU] | Freq: Three times a day (TID) | INTRAMUSCULAR | Status: DC
  Administered 2023-09-08: 1 [IU] via SUBCUTANEOUS
  Administered 2023-09-09 – 2023-09-10 (×2): 2 [IU] via SUBCUTANEOUS
  Administered 2023-09-11 – 2023-09-12 (×3): 1 [IU] via SUBCUTANEOUS
  Administered 2023-09-13 – 2023-09-15 (×5): 2 [IU] via SUBCUTANEOUS
  Administered 2023-09-15 – 2023-09-17 (×3): 1 [IU] via SUBCUTANEOUS
  Administered 2023-09-17: 2 [IU] via SUBCUTANEOUS
  Administered 2023-09-17: 1 [IU] via SUBCUTANEOUS

## 2023-09-07 MED ORDER — LIDOCAINE 5 % EX PTCH
1.0000 | MEDICATED_PATCH | Freq: Two times a day (BID) | CUTANEOUS | Status: DC | PRN
  Administered 2023-09-14: 1 via TRANSDERMAL
  Filled 2023-09-07 (×3): qty 1

## 2023-09-07 MED ORDER — CLOPIDOGREL BISULFATE 75 MG PO TABS
75.0000 mg | ORAL_TABLET | Freq: Every day | ORAL | Status: DC
  Administered 2023-09-08 – 2023-09-18 (×11): 75 mg via ORAL
  Filled 2023-09-07 (×11): qty 1

## 2023-09-07 MED ORDER — ACETAMINOPHEN 325 MG PO TABS
650.0000 mg | ORAL_TABLET | Freq: Four times a day (QID) | ORAL | Status: DC | PRN
  Administered 2023-09-12 – 2023-09-14 (×2): 650 mg via ORAL
  Filled 2023-09-07 (×2): qty 2

## 2023-09-07 MED ORDER — FUROSEMIDE 10 MG/ML IJ SOLN
60.0000 mg | Freq: Two times a day (BID) | INTRAMUSCULAR | Status: DC
  Filled 2023-09-07: qty 6

## 2023-09-07 NOTE — ED Notes (Signed)
 Phlebotomy asked to collect 2nd trop

## 2023-09-07 NOTE — ED Notes (Signed)
 IV team at bedside

## 2023-09-07 NOTE — ED Provider Notes (Signed)
  Physical Exam  BP (!) 148/64   Pulse 66   Temp 97.6 F (36.4 C) (Oral)   Resp (!) 21   Ht 5' 4 (1.626 m)   Wt 61.2 kg   SpO2 100%   BMI 23.17 kg/m   Physical Exam Vitals and nursing note reviewed.  HENT:     Head: Normocephalic and atraumatic.  Eyes:     Pupils: Pupils are equal, round, and reactive to light.  Cardiovascular:     Rate and Rhythm: Normal rate and regular rhythm.  Pulmonary:     Effort: Pulmonary effort is normal.     Breath sounds: Normal breath sounds.  Abdominal:     Palpations: Abdomen is soft.     Tenderness: There is no abdominal tenderness.  Skin:    General: Skin is warm and dry.  Neurological:     Mental Status: She is alert.  Psychiatric:        Mood and Affect: Mood normal.     Procedures  Procedures  ED Course / MDM   Clinical Course as of 09/07/23 1558  Mon Sep 07, 2023  1447 Hgb stable anemia, Cr stable CKD from recent admission and lab checks [MT]  1558 Discussed with admitting hospitalist Dr. Seena who accept patient for admission [MP]    Clinical Course User Index [MP] Pamella Ozell LABOR, DO [MT] Cottie Donnice PARAS, MD   Medical Decision Making I, Ozell Pamella DO, have assumed care of this patient from the previous provider pending admission for heart failure exacerbation with new O2 requirement  Amount and/or Complexity of Data Reviewed Labs: ordered. Radiology: ordered.  Risk Prescription drug management. Decision regarding hospitalization.          Pamella Ozell LABOR, DO 09/07/23 1558

## 2023-09-07 NOTE — ED Notes (Signed)
 IV team unable to get blood return from US  IV, ED phlebotomist consulted for lab draw

## 2023-09-07 NOTE — ED Provider Notes (Signed)
 [place] EMERGENCY DEPARTMENT AT [facility] Provider Note   CSN: [id number] Arrival date & time: 09/07/23  1116     Patient presents with: Shortness of Breath   [name] is a 89 y.o. female here with SOB x 1 week.  Lives with her son.  Pt reports arm and leg swelling, dyspnea with exertion (she is wheelchair bound due to chronic back pain and leg weakness).    Medical records reviewed - hospitalized 4 weeks ago for AMS.  She is noted to have hx of CVA on plavix , NIDDMA, RA, HTN, CKD stave IV.  Was admitted for AKI and treated for UTI as well. She was noted to have right upper arm edema with no acute DVT on ultrasound - felt to be due to IV infiltration. No urine culture results visible on our system.  Echo Dec 2024 with EF 60-65%, mild MR  {Add pertinent medical, surgical, social history, OB history to HPI:32947} HPI     Prior to Admission medications   Medication Sig Start Date End Date Taking? Authorizing Provider  Accu-Chek Softclix Lancets lancets Use as instructed to check blood sugars up to 3 times a day  Dx code e11.65 05/17/19   Jarold Medici, MD  acetaminophen  (TYLENOL ) 325 MG tablet Take 2 tablets (650 mg total) by mouth every 6 (six) hours as needed for mild pain (pain score 1-3), moderate pain (pain score 4-6), fever or headache (or temp > 37.5 C (99.5 F)). 02/16/23   Elgergawy, Brayton RAMAN, MD  amLODipine  (NORVASC ) 10 MG tablet Take 1 tablet (10 mg total) by mouth daily. 02/16/23 02/16/24  Elgergawy, Brayton RAMAN, MD  Blood Glucose Calibration (ACCU-CHEK AVIVA) SOLN Use with accu-check aviva machine 06/02/19   Jarold Medici, MD  Blood Glucose Monitoring Suppl (ACCU-CHEK AVIVA PLUS) w/Device KIT Use to check  Blood sugars up to 3 times a day.  Dx code e11.65 12/10/20   Jarold Medici, MD  carvedilol  (COREG ) 6.25 MG tablet Take 1 tablet (6.25 mg total) by mouth 2 (two) times daily. 02/16/23 02/16/24  Elgergawy, Brayton RAMAN, MD  cetirizine (ZYRTEC) 10  MG tablet Take 10 mg by mouth daily. 08/04/23   [provider]  Cholecalciferol  (VITAMIN D3) 50 MCG (2000 UT) TABS Take 2,000 Units by mouth daily.    [provider]  clopidogrel  (PLAVIX ) 75 MG tablet Take 1 tablet (75 mg total) by mouth daily. 02/17/23   Elgergawy, Brayton RAMAN, MD  Continuous Glucose Sensor (FREESTYLE LIBRE 2 SENSOR) MISC Inject 1 Device into the skin every 14 (fourteen) days. Patient not taking: Reported on 08/06/2023    [provider]  fluticasone  (FLONASE ) 50 MCG/ACT nasal spray Place 2 sprays into both nostrils See admin instructions. Instill 2 sprays into both nostrils in the morning as needed for allergies or rhinitis 06/03/22   [provider]  glucose blood test strip Use as instructed to check blood sugars up to 3 times a day.  Dx code e11.65 05/17/19   Jarold Medici, MD  hydroxychloroquine  (PLAQUENIL ) 200 MG tablet Take 200 mg by mouth daily. 03/31/23   [provider]  JARDIANCE  10 MG TABS tablet Take 10 mg by mouth daily. 03/06/23   [provider]  lidocaine  (LIDODERM ) 5 % Place 1 patch onto the skin every 12 (twelve) hours as needed (pain). 06/17/23   [provider]  loratadine  (CLARITIN ) 10 MG tablet Take 10 mg by mouth daily as needed for allergies or rhinitis.    [provider]  melatonin 3 MG TABS tablet Take 3 mg by mouth at bedtime. 03/06/23   [provider]  nitroGLYCERIN  (NITROSTAT ) 0.4 MG SL tablet PLACE 1 TABLET UNDER THE TONGUE AT THE FIRST SIGN OF ATTACK, MAY REPEAT EVERY 5 MINUTES FOR 3 DOSES IN 15 MINUTES. IF PAIN PERSISTS CALL 911 Patient taking differently: Place 0.4 mg under the tongue See admin instructions. PLACE 0.4 mg (1 TABLET) UNDER THE TONGUE AT THE FIRST SIGN OF ATTACK, MAY REPEAT EVERY 5 MINUTES FOR 3 DOSES IN 15 MINUTES. IF PAIN PERSISTS, CALL 911. 08/24/20   Jarold Medici, MD  NON FORMULARY Take 1 tablet by mouth See admin instructions. Century Mature 0.4 mg-300  mcg-250 mcg tablet- Take 1 tablet by mouth in the morning with breakfast    [provider]  ondansetron  (ZOFRAN -ODT) 4 MG disintegrating tablet Take 4 mg by mouth every 8 (eight) hours as needed. 05/25/23   [provider]  pantoprazole  (PROTONIX ) 40 MG tablet Take 1 tablet (40 mg total) by mouth daily. Patient taking differently: Take 40 mg by mouth daily as needed. 02/17/23   Elgergawy, Dawood S, MD  polyethylene glycol (MIRALAX  / GLYCOLAX ) 17 g packet Take 17 g by mouth daily as needed for mild constipation. 08/13/22   Danford, Lonni SQUIBB, MD  rosuvastatin  (CRESTOR ) 10 MG tablet Take 1 tablet (10 mg total) by mouth daily. Patient taking differently: Take 10 mg by mouth every evening. 02/17/23   Elgergawy, Brayton RAMAN, MD    Allergies: Cyclobenzaprine  and Tramadol     Review of Systems  Updated Vital Signs BP (!) 158/61 (BP Location: Left Arm)   Pulse 65   Temp (!) 97.4 F (36.3 C) (Oral)   Resp 16   Ht 5' 4 (1.626 m)   Wt 61.2 kg   SpO2 100%   BMI 23.17 kg/m   Physical Exam Constitutional:      General: She is not in acute distress. HENT:     Head: Normocephalic and atraumatic.  Eyes:     Conjunctiva/sclera: Conjunctivae normal.     Pupils: Pupils are equal, round, and reactive to light.  Cardiovascular:     Rate and Rhythm: Normal rate and regular rhythm.  Pulmonary:     Effort: Pulmonary effort is normal. No respiratory distress.     Comments: Wheezing Abdominal:     General: There is no distension.     Tenderness: There is no abdominal tenderness.  Musculoskeletal:     Comments: Edema of the bilateral upper arms, milder edema of lower extremities  Skin:    General: Skin is warm and dry.  Neurological:     General: No focal deficit present.     Mental Status: She is alert. Mental status is at baseline.  Psychiatric:        Mood and Affect: Mood normal.        Behavior: Behavior normal.     (all labs ordered are listed, but only abnormal  results are displayed) Labs Reviewed  BASIC METABOLIC PANEL WITH GFR  CBC  BRAIN NATRIURETIC PEPTIDE  TROPONIN I (HIGH SENSITIVITY)  TROPONIN I (HIGH SENSITIVITY)    EKG: EKG Interpretation Date/Time:  Monday September 07 2023 11:32:06 EDT Ventricular Rate:  66 PR Interval:  183 QRS Duration:  116 QT Interval:  438 QTC Calculation: 459 R Axis:   19  Text Interpretation: Sinus rhythm Nonspecific intraventricular conduction delay Low voltage, precordial leads Probable anteroseptal infarct, old Confirmed by Cottie Cough [phone]) on 09/07/2023 11:47:17 AM  Radiology: DG Chest 2 View Result Date: 09/07/2023 CLINICAL DATA:  Shortness of breath. EXAM: CHEST - 2 VIEW COMPARISON:  08/06/2023 and CT chest 05/12/2023. FINDINGS: Patient is slightly rotated. Trachea is midline. Heart is enlarged, as before. Thoracic aorta is calcified. Mid and lower lung zone predominant mixed interstitial and airspace opacification with moderate bilateral pleural effusions. Bibasilar collapse/consolidation. IMPRESSION: 1. Congestive heart failure. 2. Bibasilar collapse/consolidation likely due to atelectasis. Difficult to exclude pneumonia. Electronically Signed   By: Newell Eke M.D.   On: 09/07/2023 12:51    {Document cardiac monitor, telemetry assessment procedure when appropriate:32947} Procedures   Medications Ordered in the ED  furosemide  (LASIX ) injection 60 mg (has no administration in time range)      {Click here for ABCD2, HEART and other calculators REFRESH Note before signing:1}                              Medical Decision Making Amount and/or Complexity of Data Reviewed Labs: ordered. Radiology: ordered.  Risk Prescription drug management.   This patient presents to the ED with concern for SOB x 1 week. This involves an extensive number of treatment options, and is a complaint that carries with it a high risk of complications and morbidity.  The differential diagnosis includes COPD vs  CHF vs anemia vs pleural effusion vs PNA vs other  Co-morbidities that complicate the patient evaluation: CHF, advanced age  Additional history obtained from EMS  External records from outside source obtained and reviewed including recent hospitalization records  I ordered and personally interpreted labs.  The pertinent results include:  ***  I ordered imaging studies including dg chest I independently visualized and interpreted imaging which showed pulm edema/atelectasis  I agree with the radiologist interpretation  The patient was maintained on a cardiac monitor.  I personally viewed and interpreted the cardiac monitored which showed an underlying rhythm of: regular HR  Per my interpretation the patient's ECG shows no acute ischemic findings  I ordered medication including IV lasix  for CHF exacerbation  I have reviewed the patients home medicines and have made adjustments as needed  Test Considered: lower suspicion for acute PE, sepsis  After the interventions noted above, I reevaluated the patient and found that they have: stayed the same   Dispostion:  After consideration of the diagnostic results and the patients response to treatment, I feel that the patent would benefit from medical admission for CHF exacerbation and hypoxia.    {Document critical care time when appropriate  Document review of labs and clinical decision tools ie CHADS2VASC2, etc  Document your independent review of radiology images and any outside records  Document your discussion with family members, caretakers and with consultants  Document social determinants of health affecting pt's care  Document your decision making why or why not admission, treatments were needed:32947:::1}   Final diagnoses:  None    ED Discharge Orders     None

## 2023-09-07 NOTE — H&P (Signed)
 History and Physical   [name] FMW:[id number] DOB: [birth year] DOA: 09/07/2023  PCP: Ileen [name], NP   Patient coming from: Home  Chief Complaint: Shortness of breath, edema  HPI: [name] is a 89 y.o. female with medical history significant of hypertension, hyperlipidemia, diabetes, CVA, anemia, CKD 4, PAD, CAD status post CABG, gout, rheumatoid arthritis presenting with shortness of breath.  Patient was admitted 6/19-6/25 with altered mental status believed to be multifactorial secondary to polypharmacy, dehydration, decreased p.o. intake, UTI, hyperkalemia.  Was noted to have upper extremity edema at that time and DVT was ruled out, source was suspected to be IV infiltration.  Patient reports 1 week of worsening dyspnea on exertion.  Is wheelchair bound due to back pain at baseline.  Also reporting worsening edema in her lower extremities.    Denies fevers, chills, chest pain, abdominal pain, constipation, diarrhea, nausea, vomiting.  ED Course: Vital signs in the ED notable for blood pressure in the 140s-150 systolic.  Lab workup included BMP with sodium 133, bicarb 15, BUN 68, creatinine 2.36 which is stable.  CBC with hemoglobin stable at 8.4 and WBC stable at 3.6.  Troponin 80 with repeat pending.  BNP elevated to 803.  Rest for panel for flu COVID RSV pending.  Chest x-ray shows changes consistent with CHF and also noted to basilar collapse/consolidation likely representing atelectasis but difficult to rule out pneumonia.  Patient received 60 mg IV Lasix  in the ED.  Review of Systems: As per HPI otherwise all other systems reviewed and are negative.  Past Medical History:  Diagnosis Date   Acute CVA (cerebrovascular accident) (HCC) 02/13/2023   Acute kidney injury superimposed on chronic kidney disease (HCC) 08/07/2023   Cerebrovascular disease, unspecified    Chronic kidney failure, stage 4 (severe) (HCC)    Coronary atherosclerosis  of unspecified type of vessel, native or graft    CVA (cerebral vascular accident) (HCC)    Diabetes mellitus without complication (HCC)    Other and unspecified hyperlipidemia    Rheumatoid arthritis(714.0)    Shingles 2003   Unspecified essential hypertension     Past Surgical History:  Procedure Laterality Date   Cardiac Bypass  2003   CATARACT EXTRACTION, BILATERAL  2007    Social History  reports that she has never smoked. She has never used smokeless tobacco. She reports that she does not drink alcohol and does not use drugs.  Allergies  Allergen Reactions   Cyclobenzaprine  Other (See Comments)    Reaction not recalled by patient   Tramadol  Other (See Comments)    Hallucinations     Family History  Problem Relation Age of Onset   Cancer Mother        colon   Hypertension Father    Cancer Brother    Heart disease Brother    Hypertension Paternal Grandmother    Diabetes Neg Hx    Coronary artery disease Neg Hx    Stroke Neg Hx   Reviewed on admission  Prior to Admission medications   Medication Sig Start Date End Date Taking? Authorizing Provider  Accu-Chek Softclix Lancets lancets Use as instructed to check blood sugars up to 3 times a day  Dx code e11.65 05/17/19   Jarold Medici, MD  acetaminophen  (TYLENOL ) 325 MG tablet Take 2 tablets (650 mg total) by mouth every 6 (six) hours as needed for mild pain (pain score 1-3), moderate pain (pain score 4-6), fever or headache (or temp > 37.5 C (99.5  F)). 02/16/23   Elgergawy, Brayton RAMAN, MD  amLODipine  (NORVASC ) 10 MG tablet Take 1 tablet (10 mg total) by mouth daily. 02/16/23 02/16/24  Elgergawy, Brayton RAMAN, MD  Blood Glucose Calibration (ACCU-CHEK AVIVA) SOLN Use with accu-check aviva machine 06/02/19   Jarold Medici, MD  Blood Glucose Monitoring Suppl (ACCU-CHEK AVIVA PLUS) w/Device KIT Use to check  Blood sugars up to 3 times a day.  Dx code e11.65 12/10/20   Jarold Medici, MD  carvedilol  (COREG ) 6.25 MG tablet Take  1 tablet (6.25 mg total) by mouth 2 (two) times daily. 02/16/23 02/16/24  Elgergawy, Brayton RAMAN, MD  cetirizine (ZYRTEC) 10 MG tablet Take 10 mg by mouth daily. 08/04/23   [provider]  Cholecalciferol  (VITAMIN D3) 50 MCG (2000 UT) TABS Take 2,000 Units by mouth daily.    [provider]  clopidogrel  (PLAVIX ) 75 MG tablet Take 1 tablet (75 mg total) by mouth daily. 02/17/23   Elgergawy, Brayton RAMAN, MD  Continuous Glucose Sensor (FREESTYLE LIBRE 2 SENSOR) MISC Inject 1 Device into the skin every 14 (fourteen) days. Patient not taking: Reported on 08/06/2023    [provider]  fluticasone  (FLONASE ) 50 MCG/ACT nasal spray Place 2 sprays into both nostrils See admin instructions. Instill 2 sprays into both nostrils in the morning as needed for allergies or rhinitis 06/03/22   [provider]  glucose blood test strip Use as instructed to check blood sugars up to 3 times a day.  Dx code e11.65 05/17/19   Jarold Medici, MD  hydroxychloroquine  (PLAQUENIL ) 200 MG tablet Take 200 mg by mouth daily. 03/31/23   [provider]  JARDIANCE  10 MG TABS tablet Take 10 mg by mouth daily. 03/06/23   [provider]  lidocaine  (LIDODERM ) 5 % Place 1 patch onto the skin every 12 (twelve) hours as needed (pain). 06/17/23   [provider]  loratadine  (CLARITIN ) 10 MG tablet Take 10 mg by mouth daily as needed for allergies or rhinitis.    [provider]  melatonin 3 MG TABS tablet Take 3 mg by mouth at bedtime. 03/06/23   [provider]  nitroGLYCERIN  (NITROSTAT ) 0.4 MG SL tablet PLACE 1 TABLET UNDER THE TONGUE AT THE FIRST SIGN OF ATTACK, MAY REPEAT EVERY 5 MINUTES FOR 3 DOSES IN 15 MINUTES. IF PAIN PERSISTS CALL 911 Patient taking differently: Place 0.4 mg under the tongue See admin instructions. PLACE 0.4 mg (1 TABLET) UNDER THE TONGUE AT THE FIRST SIGN OF ATTACK, MAY REPEAT EVERY 5 MINUTES FOR 3 DOSES IN 15 MINUTES. IF PAIN PERSISTS, CALL  911. 08/24/20   Jarold Medici, MD  NON FORMULARY Take 1 tablet by mouth See admin instructions. Century Mature 0.4 mg-300 mcg-250 mcg tablet- Take 1 tablet by mouth in the morning with breakfast    [provider]  ondansetron  (ZOFRAN -ODT) 4 MG disintegrating tablet Take 4 mg by mouth every 8 (eight) hours as needed. 05/25/23   [provider]  pantoprazole  (PROTONIX ) 40 MG tablet Take 1 tablet (40 mg total) by mouth daily. Patient taking differently: Take 40 mg by mouth daily as needed. 02/17/23   Elgergawy, Dawood S, MD  polyethylene glycol (MIRALAX  / GLYCOLAX ) 17 g packet Take 17 g by mouth daily as needed for mild constipation. 08/13/22   Danford, Lonni SQUIBB, MD  rosuvastatin  (CRESTOR ) 10 MG tablet Take 1 tablet (10 mg total) by mouth daily. Patient taking differently: Take 10 mg by mouth every evening. 02/17/23   Elgergawy, Brayton RAMAN, MD  Physical Exam: Vitals:   09/07/23 1120 09/07/23 1135 09/07/23 1452 09/07/23 1534  BP:  (!) 158/61 (!) 148/64   Pulse:  65 66   Resp:  16 (!) 21   Temp:  (!) 97.4 F (36.3 C)  97.6 F (36.4 C)  TempSrc:  Oral  Oral  SpO2:  100% 100%   Weight: 61.2 kg     Height: 5' 4 (1.626 m)       Physical Exam Constitutional:      General: She is not in acute distress.    Appearance: Normal appearance.  HENT:     Head: Normocephalic and atraumatic.     Mouth/Throat:     Mouth: Mucous membranes are moist.     Pharynx: Oropharynx is clear.  Eyes:     Extraocular Movements: Extraocular movements intact.     Pupils: Pupils are equal, round, and reactive to light.  Cardiovascular:     Rate and Rhythm: Normal rate and regular rhythm.     Pulses: Normal pulses.     Heart sounds: Normal heart sounds.  Pulmonary:     Effort: Pulmonary effort is normal. No respiratory distress.     Breath sounds: Rales (trace) present.  Abdominal:     General: Bowel sounds are normal. There is no distension.     Palpations: Abdomen is soft.      Tenderness: There is no abdominal tenderness.  Musculoskeletal:        General: No swelling or deformity.     Right lower leg: Edema (Pedal) present.     Left lower leg: Edema (Pedal) present.  Skin:    General: Skin is warm and dry.  Neurological:     General: No focal deficit present.     Mental Status: Mental status is at baseline.    Labs on Admission: I have personally reviewed following labs and imaging studies  CBC: Recent Labs  Lab 09/07/23 1351  WBC 3.6*  HGB 8.4*  HCT 26.4*  MCV 98.5  PLT 218    Basic Metabolic Panel: Recent Labs  Lab 09/07/23 1351  NA 133*  K 4.9  CL 107  CO2 15*  GLUCOSE 92  BUN 68*  CREATININE 2.36*  CALCIUM  9.0    GFR: Estimated Creatinine Clearance: 12.6 mL/min (A) (by C-G formula based on SCr of 2.36 mg/dL (H)).  Liver Function Tests: No results for input(s): AST, ALT, ALKPHOS, BILITOT, PROT, ALBUMIN in the last 168 hours.  Urine analysis:    Component Value Date/Time   COLORURINE YELLOW 08/07/2023 1559   APPEARANCEUR CLOUDY (A) 08/07/2023 1559   LABSPEC 1.012 08/07/2023 1559   PHURINE 5.0 08/07/2023 1559   GLUCOSEU 150 (A) 08/07/2023 1559   HGBUR NEGATIVE 08/07/2023 1559   BILIRUBINUR NEGATIVE 08/07/2023 1559   BILIRUBINUR negative 11/14/2020 1608   KETONESUR NEGATIVE 08/07/2023 1559   PROTEINUR 30 (A) 08/07/2023 1559   UROBILINOGEN 0.2 11/14/2020 1608   UROBILINOGEN 0.2 04/26/2009 0014   NITRITE NEGATIVE 08/07/2023 1559   LEUKOCYTESUR LARGE (A) 08/07/2023 1559    Radiological Exams on Admission: DG Chest 2 View Result Date: 09/07/2023 CLINICAL DATA:  Shortness of breath. EXAM: CHEST - 2 VIEW COMPARISON:  08/06/2023 and CT chest 05/12/2023. FINDINGS: Patient is slightly rotated. Trachea is midline. Heart is enlarged, as before. Thoracic aorta is calcified. Mid and lower lung zone predominant mixed interstitial and airspace opacification with moderate bilateral pleural effusions. Bibasilar  collapse/consolidation. IMPRESSION: 1. Congestive heart failure. 2. Bibasilar collapse/consolidation likely due to atelectasis. Difficult  to exclude pneumonia. Electronically Signed   By: Newell Eke M.D.   On: 09/07/2023 12:51   EKG: Independently reviewed.  Sinus rhythm at 66 bpm.  Nonspecific T wave flattening multiple leads.  Minimal baseline artifact.  Low voltage multiple leads.  Assessment/Plan Active Problems:   History of CVA (cerebrovascular accident)   DM type 2 (diabetes mellitus, type 2) (HCC)   Essential hypertension   Hyperlipidemia   CKD (chronic kidney disease), stage IV (HCC)   Anemia of chronic disease   CAD, NATIVE VESSEL   Rheumatoid arthritis (HCC)   History of coronary artery bypass graft   Gout   Hypertensive heart and renal disease   Peripheral arterial disease (HCC)   History of CAD (coronary artery disease) of artery bypass graft   Acute CHF > No listed history of CHF.  We did have echo done at the end of last year which showed EF 60-65%, G1 DD, normal RV function. > Now with progressive dyspnea on exertion, oxygen requirement in the ED.  BNP elevated to 803.  Chest x-ray consistent with CHF. > Patient received Lasix  60 mg IV in the ED. - Monitor on telemetry overnight - Continue with Lasix  60 mg IV twice daily - Check magnesium - Trend renal function and electrolytes - Echocardiogram - Strict I's and O's, daily weights  Hypertension - Continue Coreg  - Holding amlodipine  while on new Lasix  as above  Hyperlipidemia - Continue home rosuvastatin   Diabetes - SSI  Anemia - Hemoglobin stable at 8.4  CKD 4 > Hemoglobin near baseline at 2.36 - Trend renal function and electrolytes  PAD CAD > Status post CABG - Continue home Coreg , rosuvastatin , Plavix   History of CVA - Continue home rosuvastatin , Plavix   Rheumatoid arthritis - Continue home Plaquenil   DVT prophylaxis: Heparin  Code Status:   DNR, intubation okay Family Communication:   None on admission  Disposition Plan:   Patient is from:  Home  Anticipated DC to:  Home  Anticipated DC date:  1 to 3 days  Anticipated DC barriers: None  Consults called:  None Admission status:  Observation, telemetry  Severity of Illness: The appropriate patient status for this patient is OBSERVATION. Observation status is judged to be reasonable and necessary in order to provide the required intensity of service to ensure the patient's safety. The patient's presenting symptoms, physical exam findings, and initial radiographic and laboratory data in the context of their medical condition is felt to place them at decreased risk for further clinical deterioration. Furthermore, it is anticipated that the patient will be medically stable for discharge from the hospital within 2 midnights of admission.    Marsa KATHEE Scurry MD [facility]  How to contact the [facility] Attending or Consulting provider 7A - 7P or covering provider during after hours 7P -7A, for this patient?   Check the care team in [facility] and look for a) attending/consulting [facility] provider listed and b) the [facility] team listed Log into [email or web address] and use [facility] universal password to access. If you do not have the password, please contact the hospital operator. Locate the [facility] provider you are looking for under [facility] and page to a number that you can be directly reached. If you still have difficulty reaching the provider, please page the [facility] (Director on Call) for the Hospitalists listed on amion for assistance.  09/07/2023, 3:55 PM

## 2023-09-07 NOTE — ED Notes (Signed)
 Phlebotomy at bedside.

## 2023-09-07 NOTE — ED Notes (Signed)
 2nd trop due @1600 

## 2023-09-07 NOTE — ED Triage Notes (Signed)
 Pt from home for [facility] for a week. C/O cough. Denies fevers. Axox4. Pt states she takes abx for breast infection.

## 2023-09-08 ENCOUNTER — Inpatient Hospital Stay (HOSPITAL_COMMUNITY)

## 2023-09-08 DIAGNOSIS — Z7984 Long term (current) use of oral hypoglycemic drugs: Secondary | ICD-10-CM | POA: Diagnosis not present

## 2023-09-08 DIAGNOSIS — I509 Heart failure, unspecified: Secondary | ICD-10-CM | POA: Insufficient documentation

## 2023-09-08 DIAGNOSIS — E1169 Type 2 diabetes mellitus with other specified complication: Secondary | ICD-10-CM | POA: Diagnosis present

## 2023-09-08 DIAGNOSIS — B961 Klebsiella pneumoniae [K. pneumoniae] as the cause of diseases classified elsewhere: Secondary | ICD-10-CM | POA: Diagnosis present

## 2023-09-08 DIAGNOSIS — Z66 Do not resuscitate: Secondary | ICD-10-CM | POA: Diagnosis present

## 2023-09-08 DIAGNOSIS — I2510 Atherosclerotic heart disease of native coronary artery without angina pectoris: Secondary | ICD-10-CM | POA: Diagnosis present

## 2023-09-08 DIAGNOSIS — N184 Chronic kidney disease, stage 4 (severe): Secondary | ICD-10-CM | POA: Diagnosis present

## 2023-09-08 DIAGNOSIS — Z79899 Other long term (current) drug therapy: Secondary | ICD-10-CM | POA: Diagnosis not present

## 2023-09-08 DIAGNOSIS — I5031 Acute diastolic (congestive) heart failure: Secondary | ICD-10-CM | POA: Diagnosis not present

## 2023-09-08 DIAGNOSIS — I272 Pulmonary hypertension, unspecified: Secondary | ICD-10-CM | POA: Diagnosis present

## 2023-09-08 DIAGNOSIS — N179 Acute kidney failure, unspecified: Secondary | ICD-10-CM | POA: Diagnosis not present

## 2023-09-08 DIAGNOSIS — E785 Hyperlipidemia, unspecified: Secondary | ICD-10-CM | POA: Diagnosis present

## 2023-09-08 DIAGNOSIS — I10 Essential (primary) hypertension: Secondary | ICD-10-CM | POA: Diagnosis not present

## 2023-09-08 DIAGNOSIS — E8809 Other disorders of plasma-protein metabolism, not elsewhere classified: Secondary | ICD-10-CM | POA: Diagnosis present

## 2023-09-08 DIAGNOSIS — M7989 Other specified soft tissue disorders: Secondary | ICD-10-CM | POA: Diagnosis not present

## 2023-09-08 DIAGNOSIS — M069 Rheumatoid arthritis, unspecified: Secondary | ICD-10-CM | POA: Diagnosis present

## 2023-09-08 DIAGNOSIS — Z1152 Encounter for screening for COVID-19: Secondary | ICD-10-CM | POA: Diagnosis not present

## 2023-09-08 DIAGNOSIS — E1151 Type 2 diabetes mellitus with diabetic peripheral angiopathy without gangrene: Secondary | ICD-10-CM | POA: Diagnosis present

## 2023-09-08 DIAGNOSIS — D631 Anemia in chronic kidney disease: Secondary | ICD-10-CM | POA: Diagnosis present

## 2023-09-08 DIAGNOSIS — I5033 Acute on chronic diastolic (congestive) heart failure: Secondary | ICD-10-CM | POA: Diagnosis present

## 2023-09-08 DIAGNOSIS — Z8249 Family history of ischemic heart disease and other diseases of the circulatory system: Secondary | ICD-10-CM | POA: Diagnosis not present

## 2023-09-08 DIAGNOSIS — E11649 Type 2 diabetes mellitus with hypoglycemia without coma: Secondary | ICD-10-CM | POA: Diagnosis not present

## 2023-09-08 DIAGNOSIS — E1122 Type 2 diabetes mellitus with diabetic chronic kidney disease: Secondary | ICD-10-CM | POA: Diagnosis present

## 2023-09-08 DIAGNOSIS — I25810 Atherosclerosis of coronary artery bypass graft(s) without angina pectoris: Secondary | ICD-10-CM | POA: Diagnosis not present

## 2023-09-08 DIAGNOSIS — M109 Gout, unspecified: Secondary | ICD-10-CM | POA: Diagnosis present

## 2023-09-08 DIAGNOSIS — I130 Hypertensive heart and chronic kidney disease with heart failure and stage 1 through stage 4 chronic kidney disease, or unspecified chronic kidney disease: Secondary | ICD-10-CM | POA: Diagnosis present

## 2023-09-08 DIAGNOSIS — N390 Urinary tract infection, site not specified: Secondary | ICD-10-CM | POA: Diagnosis present

## 2023-09-08 DIAGNOSIS — I081 Rheumatic disorders of both mitral and tricuspid valves: Secondary | ICD-10-CM | POA: Diagnosis present

## 2023-09-08 DIAGNOSIS — E872 Acidosis, unspecified: Secondary | ICD-10-CM | POA: Diagnosis present

## 2023-09-08 DIAGNOSIS — R0602 Shortness of breath: Secondary | ICD-10-CM | POA: Diagnosis present

## 2023-09-08 LAB — COMPREHENSIVE METABOLIC PANEL WITH GFR
ALT: 13 U/L (ref 0–44)
AST: 17 U/L (ref 15–41)
Albumin: 2.8 g/dL — ABNORMAL LOW (ref 3.5–5.0)
Alkaline Phosphatase: 71 U/L (ref 38–126)
Anion gap: 9 (ref 5–15)
BUN: 74 mg/dL — ABNORMAL HIGH (ref 8–23)
CO2: 17 mmol/L — ABNORMAL LOW (ref 22–32)
Calcium: 8.8 mg/dL — ABNORMAL LOW (ref 8.9–10.3)
Chloride: 106 mmol/L (ref 98–111)
Creatinine, Ser: 2.46 mg/dL — ABNORMAL HIGH (ref 0.44–1.00)
GFR, Estimated: 18 mL/min — ABNORMAL LOW (ref >60)
Glucose, Bld: 89 mg/dL (ref 70–99)
Potassium: 4.6 mmol/L (ref 3.5–5.1)
Sodium: 132 mmol/L — ABNORMAL LOW (ref 135–145)
Total Bilirubin: 0.7 mg/dL (ref 0.0–1.2)
Total Protein: 5.7 g/dL — ABNORMAL LOW (ref 6.5–8.1)

## 2023-09-08 LAB — IRON AND TIBC
Iron: 37 ug/dL (ref 28–170)
Saturation Ratios: 11 % (ref 10.4–31.8)
TIBC: 339 ug/dL (ref 250–450)
UIBC: 302 ug/dL

## 2023-09-08 LAB — ECHOCARDIOGRAM COMPLETE
AR max vel: 2.55 cm2
AV Area VTI: 2.64 cm2
AV Area mean vel: 2.53 cm2
AV Mean grad: 4 mmHg
AV Peak grad: 7.7 mmHg
Ao pk vel: 1.39 m/s
Area-P 1/2: 5.02 cm2
Height: 64.016 in
S' Lateral: 3.1 cm
Weight: 2313.95 [oz_av]

## 2023-09-08 LAB — RETICULOCYTES
Immature Retic Fract: 21 % — ABNORMAL HIGH (ref 2.3–15.9)
RBC.: 2.78 MIL/uL — ABNORMAL LOW (ref 3.87–5.11)
Retic Count, Absolute: 118.7 K/uL (ref 19.0–186.0)
Retic Ct Pct: 4.3 % — ABNORMAL HIGH (ref 0.4–3.1)

## 2023-09-08 LAB — HEMOGLOBIN A1C
Hgb A1c MFr Bld: 5.2 % (ref 4.8–5.6)
Mean Plasma Glucose: 102.54 mg/dL

## 2023-09-08 LAB — CBC
HCT: 23.7 % — ABNORMAL LOW (ref 36.0–46.0)
Hemoglobin: 7.8 g/dL — ABNORMAL LOW (ref 12.0–15.0)
MCH: 30.7 pg (ref 26.0–34.0)
MCHC: 32.9 g/dL (ref 30.0–36.0)
MCV: 93.3 fL (ref 80.0–100.0)
Platelets: 212 K/uL (ref 150–400)
RBC: 2.54 MIL/uL — ABNORMAL LOW (ref 3.87–5.11)
RDW: 14.9 % (ref 11.5–15.5)
WBC: 3.4 K/uL — ABNORMAL LOW (ref 4.0–10.5)
nRBC: 0 % (ref 0.0–0.2)

## 2023-09-08 LAB — GLUCOSE, CAPILLARY
Glucose-Capillary: 85 mg/dL (ref 70–99)
Glucose-Capillary: 86 mg/dL (ref 70–99)
Glucose-Capillary: 136 mg/dL — ABNORMAL HIGH (ref 70–99)
Glucose-Capillary: 125 mg/dL — ABNORMAL HIGH (ref 70–99)

## 2023-09-08 LAB — FOLATE: Folate: 38.5 ng/mL (ref >5.9)

## 2023-09-08 LAB — VITAMIN B12: Vitamin B-12: 402 pg/mL (ref 180–914)

## 2023-09-08 LAB — TROPONIN I (HIGH SENSITIVITY): Troponin I (High Sensitivity): 87 ng/L — ABNORMAL HIGH (ref <18)

## 2023-09-08 LAB — FERRITIN: Ferritin: 185 ng/mL (ref 11–307)

## 2023-09-08 MED ORDER — SIMETHICONE 80 MG PO CHEW
80.0000 mg | CHEWABLE_TABLET | Freq: Four times a day (QID) | ORAL | Status: DC | PRN
  Administered 2023-09-08: 80 mg via ORAL
  Filled 2023-09-08: qty 1

## 2023-09-08 MED ORDER — HYDRALAZINE HCL 20 MG/ML IJ SOLN: 5.0000 mg | Freq: Four times a day (QID) | INTRAMUSCULAR | Status: DC | PRN

## 2023-09-08 MED ORDER — MORPHINE SULFATE (PF) 2 MG/ML IV SOLN
1.0000 mg | INTRAVENOUS | Status: DC | PRN
  Administered 2023-09-08 – 2023-09-16 (×10): 1 mg via INTRAVENOUS
  Filled 2023-09-08 (×11): qty 1

## 2023-09-08 MED ORDER — FUROSEMIDE 10 MG/ML IJ SOLN
80.0000 mg | Freq: Two times a day (BID) | INTRAMUSCULAR | Status: DC
  Administered 2023-09-08 – 2023-09-10 (×5): 80 mg via INTRAVENOUS
  Filled 2023-09-08 (×5): qty 8

## 2023-09-08 MED ORDER — ALBUTEROL SULFATE (2.5 MG/3ML) 0.083% IN NEBU
2.5000 mg | INHALATION_SOLUTION | RESPIRATORY_TRACT | Status: DC | PRN
  Administered 2023-09-08: 2.5 mg via RESPIRATORY_TRACT
  Filled 2023-09-08: qty 3

## 2023-09-08 MED ORDER — NITROGLYCERIN 0.4 MG SL SUBL: 0.4000 mg | SUBLINGUAL_TABLET | SUBLINGUAL | Status: DC | PRN

## 2023-09-08 MED ORDER — HYDRALAZINE HCL 20 MG/ML IJ SOLN
10.0000 mg | Freq: Four times a day (QID) | INTRAMUSCULAR | Status: DC | PRN
  Administered 2023-09-08 – 2023-09-12 (×4): 10 mg via INTRAVENOUS
  Filled 2023-09-08 (×5): qty 1

## 2023-09-08 NOTE — Plan of Care (Addendum)
 89 year old female history of diastolic heart failure and history of CAD status post CABG 2012 admitted for CHF exacerbation.   Patient is complaining about bilateral lower chest pain/tingling sensation that radiates to bilateral shoulder.  -Obtain EKG which showed normal sinus rhythm heart rate 66.  Pending next troponin check.  -Per chart review previous troponin 80 trended up to 95.  Elevated troponin secondary to demand ischemia in the context of CHF exacerbation hypertensive urgency. -Noncardiac chest pain in the setting of bibasilar atelectasis.  Giving Tylenol  and continue spirometry every 2 hours.   update, flat troponin without any delta change.  ACS has been ruled out.SABRA

## 2023-09-08 NOTE — Plan of Care (Signed)
 Received a call from rapid response team-Jennifer Motley, NP that patient was complaining about some left-sided arm weakness, swelling and pain.  However during evaluation at the bedside found out that patient has chronic left-sided arm swelling  this is not new.  NIH is stroke is 1.  Patient reported that she cannot lift her left arm because of the pain.  Physical exam revealed it is swollen with 2+ edema.  Patient is stating that this is a new change.  At this time there is no concern for CVA and no indication for head CT scan/MRI.  Obtaining left upper extremity ultrasound rule out DVT.  Marionette Meskill, MD [facility] 09/08/2023, 9:31 PM

## 2023-09-08 NOTE — Progress Notes (Signed)
 PROGRESS NOTE    [name]  FMW:[id number] DOB: [birth year] DOA: 09/07/2023 PCP: Ileen [name], NP  89/F, chronically ill, wheelchair-bound with history of CVA, diabetes, dislocated right shoulder, CKD 4, CABG, PAD, gout, rheumatoid arthritis presented to the ED with shortness of breath and swelling. - Recently hospitalized 6/19-25 with encephalopathy which was felt to be multifactorial related to polypharmacy UTI etc, subsequently after discharge home noticed worsening edema in lower legs along with progressive dyspnea on exertion. - In the ED hypertensive, labs noted creatinine of 2.3, hemoglobin 8.4, troponin 80, BNP 803, chest x-ray concerning for CHF and basilar collapse/consolidation likely reflecting atelectasis -Admitted, started on diuretics  Subjective: -Feels fair, still with dyspnea, rather uncomfortable in bed  Assessment and Plan:  Acute CHF, likely diastolic -Echo last year noted preserved EF, grade 1 DD, normal RV -Repeat echo ordered on admission will follow-up -She has hypoalbuminemia as well which is likely contributing to third spacing -Continue Lasix , increased dose to 80 Mg twice daily, continue Coreg  -GDMT limited by CKD 4 -Conservative management on account of advanced age frailty, poor functional status  CKD 4 -Creatinine in the 2.3-2.6 range of baseline, relatively stable, monitor with diuresis, avoid hypotension  Acute on chronic anemia -Likely from CKD and hemodilution -Check anemia panel   Hypertension - Continue Coreg , diuretics as above   Hyperlipidemia - Continue rosuvastatin    Diabetes - CBGs are controlled, check A1c   PAD CAD > Status post CABG - Continue home Coreg , rosuvastatin , Plavix    History of CVA - Continue rosuvastatin , Plavix    Rheumatoid arthritis - Continue home Plaquenil   Chronic right shoulder dislocation -Noted   DVT prophylaxis:      Heparin  Code Status:              DNR, intubation okay-per  discussion with admitting MD yesterday, I did not rediscuss this today Family Communication:   No family at bedside, will attempt to contact son Disposition Plan:   Consultants:    Procedures:   Antimicrobials:    Objective: Vitals:   09/07/23 2255 09/08/23 0007 09/08/23 0425 09/08/23 0713  BP:  (!) 161/57 (!) 159/62 (!) 162/63  Pulse:  67 76 75  Resp:  20 20 20   Temp:  98.1 F (36.7 C) 97.7 F (36.5 C) (!) 97.4 F (36.3 C)  TempSrc:  Oral Oral Oral  SpO2:  100% 94% 100%  Weight: 65.6 kg  65.6 kg   Height: 5' 4.02 (1.626 m)       Intake/Output Summary (Last 24 hours) at 09/08/2023 9057 Last data filed at 09/08/2023 0900 Gross per 24 hour  Intake 300 ml  Output 350 ml  Net -50 ml   Filed Weights   09/07/23 1120 09/07/23 2255 09/08/23 0425  Weight: 61.2 kg 65.6 kg 65.6 kg    Examination:  General exam: Chronically ill-appearing, AAO x 3 HEENT: Positive JVD CVS: S1-S2, regular rhythm Lungs: Few basilar Rales Abdomen: Soft, nontender, bowel sounds present Extremities: 1-2+ edema Right shoulder dislocated Neuro: Right and left-sided deficits, per patient these are baseline  Psychiatry:  Mood & affect appropriate.     Data Reviewed:   CBC: Recent Labs  Lab 09/07/23 1351 09/08/23 0208  WBC 3.6* 3.4*  HGB 8.4* 7.8*  HCT 26.4* 23.7*  MCV 98.5 93.3  PLT 218 212   Basic Metabolic Panel: Recent Labs  Lab 09/07/23 1351 09/07/23 1659 09/08/23 0208  NA 133*  --  132*  K 4.9  --  4.6  CL  107  --  106  CO2 15*  --  17*  GLUCOSE 92  --  89  BUN 68*  --  74*  CREATININE 2.36*  --  2.46*  CALCIUM  9.0  --  8.8*  MG  --  2.0  --    GFR: Estimated Creatinine Clearance: 12.1 mL/min (A) (by C-G formula based on SCr of 2.46 mg/dL (H)). Liver Function Tests: Recent Labs  Lab 09/08/23 0208  AST 17  ALT 13  ALKPHOS 71  BILITOT 0.7  PROT 5.7*  ALBUMIN 2.8*   No results for input(s): LIPASE, AMYLASE in the last 168 hours. No results for input(s):  AMMONIA in the last 168 hours. Coagulation Profile: No results for input(s): INR, PROTIME in the last 168 hours. Cardiac Enzymes: No results for input(s): CKTOTAL, CKMB, CKMBINDEX, TROPONINI in the last 168 hours. BNP (last 3 results) No results for input(s): PROBNP in the last 8760 hours. HbA1C: No results for input(s): HGBA1C in the last 72 hours. CBG: Recent Labs  Lab 09/07/23 1707 09/07/23 2306 09/08/23 0638  GLUCAP 81 132* 85   Lipid Profile: No results for input(s): CHOL, HDL, LDLCALC, TRIG, CHOLHDL, LDLDIRECT in the last 72 hours. Thyroid  Function Tests: No results for input(s): TSH, T4TOTAL, FREET4, T3FREE, THYROIDAB in the last 72 hours. Anemia Panel: No results for input(s): VITAMINB12, FOLATE, FERRITIN, TIBC, IRON , RETICCTPCT in the last 72 hours. Urine analysis:    Component Value Date/Time   COLORURINE YELLOW 08/07/2023 1559   APPEARANCEUR CLOUDY (A) 08/07/2023 1559   LABSPEC 1.012 08/07/2023 1559   PHURINE 5.0 08/07/2023 1559   GLUCOSEU 150 (A) 08/07/2023 1559   HGBUR NEGATIVE 08/07/2023 1559   BILIRUBINUR NEGATIVE 08/07/2023 1559   BILIRUBINUR negative 11/14/2020 1608   KETONESUR NEGATIVE 08/07/2023 1559   PROTEINUR 30 (A) 08/07/2023 1559   UROBILINOGEN 0.2 11/14/2020 1608   UROBILINOGEN 0.2 04/26/2009 0014   NITRITE NEGATIVE 08/07/2023 1559   LEUKOCYTESUR LARGE (A) 08/07/2023 1559   Sepsis Labs: @LABRCNTIP (procalcitonin:4,lacticidven:4)  ) Recent Results (from the past 240 hours)  Resp panel by RT-PCR (RSV, Flu A&B, Covid) Anterior Nasal Swab     Status: None   Collection Time: 09/07/23  4:05 PM   Specimen: Anterior Nasal Swab  Result Value Ref Range Status   SARS Coronavirus 2 by RT PCR NEGATIVE NEGATIVE Final   Influenza A by PCR NEGATIVE NEGATIVE Final   Influenza B by PCR NEGATIVE NEGATIVE Final    Comment: (NOTE) The Xpert Xpress SARS-CoV-2/FLU/RSV plus assay is intended as an aid in the  diagnosis of influenza from Nasopharyngeal swab specimens and should not be used as a sole basis for treatment. Nasal washings and aspirates are unacceptable for Xpert Xpress SARS-CoV-2/FLU/RSV testing.  Fact Sheet for Patients: BloggerCourse.com  Fact Sheet for Healthcare Providers: SeriousBroker.it  This test is not yet approved or cleared by the [place]  FDA and has been authorized for detection and/or diagnosis of SARS-CoV-2 by FDA under an Emergency Use Authorization (EUA). This EUA will remain in effect (meaning this test can be used) for the duration of the COVID-19 declaration under Section 564(b)(1) of the Act, 21 U.S.C. section 360bbb-3(b)(1), unless the authorization is terminated or revoked.     Resp Syncytial Virus by PCR NEGATIVE NEGATIVE Final    Comment: (NOTE) Fact Sheet for Patients: BloggerCourse.com  Fact Sheet for Healthcare Providers: SeriousBroker.it  This test is not yet approved or cleared by the [place]  FDA and has been authorized for detection and/or diagnosis of SARS-CoV-2  by FDA under an Emergency Use Authorization (EUA). This EUA will remain in effect (meaning this test can be used) for the duration of the COVID-19 declaration under Section 564(b)(1) of the Act, 21 U.S.C. section 360bbb-3(b)(1), unless the authorization is terminated or revoked.  Performed at [facility], [address], [place], [place] [address]      Radiology Studies: DG Chest 2 View Result Date: 09/07/2023 CLINICAL DATA:  Shortness of breath. EXAM: CHEST - 2 VIEW COMPARISON:  08/06/2023 and CT chest 05/12/2023. FINDINGS: Patient is slightly rotated. Trachea is midline. Heart is enlarged, as before. Thoracic aorta is calcified. Mid and lower lung zone predominant mixed interstitial and airspace opacification with moderate bilateral pleural effusions. Bibasilar  collapse/consolidation. IMPRESSION: 1. Congestive heart failure. 2. Bibasilar collapse/consolidation likely due to atelectasis. Difficult to exclude pneumonia. Electronically Signed   By: Newell Eke M.D.   On: 09/07/2023 12:51     Scheduled Meds:  carvedilol   6.25 mg Oral BID WC   clopidogrel   75 mg Oral Daily   furosemide   80 mg Intravenous BID   heparin   5,000 Units Subcutaneous Q8H   hydroxychloroquine   200 mg Oral Daily   insulin  aspart  0-9 Units Subcutaneous TID WC   melatonin  3 mg Oral QHS   pantoprazole   40 mg Oral Daily   rosuvastatin   10 mg Oral QPM   sodium chloride  flush  3 mL Intravenous Q12H   Continuous Infusions:   LOS: 0 days    Time spent:    Sigurd Pac, MD [facility]   09/08/2023, 9:42 AM

## 2023-09-08 NOTE — Significant Event (Signed)
 Rapid Response Event Note   Reason for Call :  Left arm weakness  Initial Focused Assessment:  Patient is alert and oriented x4. Complaining of 8/10 left arm pain. Left arm is swollen, +2 edema, moderate palpable radial pulse, warm to the touch, tingling noted by patient, able to move fingers, move at the elbow and shoulder. Patient states that this pain and swelling is not new however the pain has gotten worse and she is unable to raise it due to the pain. NIH unremarkable besides a drift in the left arm.   Temp 97.7 O2 99 HR 72 BP 160/62 RR 18   Interventions:  VS NIH 1 left arm drift Elevated left arm above the heart MD notified  Plan of Care:  MD ordered Vas US  of left upper extremity. Give tylenol  for pain control. Please call rapid for further assistance if needed.   Event Summary:   MD Notified: 2108 Call Time: 2040 Arrival Time: 2045 End Time: 2120  Delon Daub, RN

## 2023-09-08 NOTE — Progress Notes (Signed)
*  PRELIMINARY RESULTS* Echocardiogram 2D Echocardiogram has been performed.  [name] 09/08/2023, 3:14 PM

## 2023-09-08 NOTE — Progress Notes (Signed)
 Heart Failure Navigator Progress Note  Assessed for Heart & Vascular TOC clinic readiness.  Patient does not meet criteria due to EF 60-65%, bed bound , No HF TOC per Dr. Fairy. .   Navigator will sign off at this time.    Stephane Haddock, BSN, Scientist, clinical (histocompatibility and immunogenetics) Only

## 2023-09-09 ENCOUNTER — Inpatient Hospital Stay (HOSPITAL_COMMUNITY)

## 2023-09-09 DIAGNOSIS — M7989 Other specified soft tissue disorders: Secondary | ICD-10-CM

## 2023-09-09 DIAGNOSIS — I5031 Acute diastolic (congestive) heart failure: Secondary | ICD-10-CM | POA: Diagnosis not present

## 2023-09-09 LAB — GLUCOSE, CAPILLARY
Glucose-Capillary: 67 mg/dL — ABNORMAL LOW (ref 70–99)
Glucose-Capillary: 89 mg/dL (ref 70–99)
Glucose-Capillary: 172 mg/dL — ABNORMAL HIGH (ref 70–99)
Glucose-Capillary: 70 mg/dL (ref 70–99)
Glucose-Capillary: 117 mg/dL — ABNORMAL HIGH (ref 70–99)

## 2023-09-09 LAB — BASIC METABOLIC PANEL WITH GFR
Anion gap: 11 (ref 5–15)
BUN: 76 mg/dL — ABNORMAL HIGH (ref 8–23)
CO2: 18 mmol/L — ABNORMAL LOW (ref 22–32)
Calcium: 8.8 mg/dL — ABNORMAL LOW (ref 8.9–10.3)
Chloride: 107 mmol/L (ref 98–111)
Creatinine, Ser: 2.35 mg/dL — ABNORMAL HIGH (ref 0.44–1.00)
GFR, Estimated: 19 mL/min — ABNORMAL LOW (ref >60)
Glucose, Bld: 85 mg/dL (ref 70–99)
Potassium: 4.7 mmol/L (ref 3.5–5.1)
Sodium: 136 mmol/L (ref 135–145)

## 2023-09-09 LAB — CBC
HCT: 22.6 % — ABNORMAL LOW (ref 36.0–46.0)
Hemoglobin: 7.4 g/dL — ABNORMAL LOW (ref 12.0–15.0)
MCH: 31 pg (ref 26.0–34.0)
MCHC: 32.7 g/dL (ref 30.0–36.0)
MCV: 94.6 fL (ref 80.0–100.0)
Platelets: 207 K/uL (ref 150–400)
RBC: 2.39 MIL/uL — ABNORMAL LOW (ref 3.87–5.11)
RDW: 15.5 % (ref 11.5–15.5)
WBC: 3.1 K/uL — ABNORMAL LOW (ref 4.0–10.5)
nRBC: 0 % (ref 0.0–0.2)

## 2023-09-09 MED ORDER — IRON SUCROSE 500 MG IVPB - SIMPLE MED
500.0000 mg | Freq: Once | INTRAVENOUS | Status: DC
  Filled 2023-09-09: qty 275

## 2023-09-09 MED ORDER — SODIUM CHLORIDE 0.9 % IV SOLN
500.0000 mg | Freq: Once | INTRAVENOUS | Status: AC
  Administered 2023-09-09: 500 mg via INTRAVENOUS
  Filled 2023-09-09: qty 25

## 2023-09-09 MED ORDER — DEXTROSE 50 % IV SOLN: 25.0000 mL | INTRAVENOUS | Status: DC | PRN

## 2023-09-09 NOTE — TOC Initial Note (Signed)
 Transition of Care [facility]) - Initial/Assessment Note    Patient Details  Name: [name] MRN: [id number] Date of Birth: Aug 15, 1927  [facility] CM/SW Contact:    Waddell Barnie Rama, RN Phone Number: 09/09/2023, 5:08 PM  Clinical Narrative:                 From home with son, has PCP and insurance on file, states has  [facility] services in place  with [facility] and would like to continue, has w/chair hosp bed and walker at home.  States her son will transport them home at [employer] and family is support system, states gets medications from [facility].  Pta w/chair bound.  NCM received call from [facility] with [facility] stating patient is active with them for HHPT, HHRN, HHOT.  Will need orders prior to dc.   Expected Discharge Plan: Home w Home Health Services Barriers to Discharge: Continued Medical Work up   Patient Goals and CMS Choice Patient states their goals for this hospitalization and ongoing recovery are:: return home with son CMS Medicare.gov Compare Post Acute Care list provided to:: Patient Choice offered to / list presented to : Patient      Expected Discharge Plan and Services   Discharge Planning Services: CM Consult Post Acute Care Choice: Home Health Living arrangements for the past 2 months: Single Family Home                 DME Arranged: N/A DME Agency: NA       HH Arranged: PT, OT, RN HH Agency: [facility] Date HH Agency Contacted: 09/09/23 Time HH Agency Contacted: 0900 Representative spoke with at [facility] Agency: Kiki  Prior Living Arrangements/Services Living arrangements for the past 2 months: Single Family Home Lives with:: Adult Children Patient language and need for interpreter reviewed:: Yes Do you feel safe going back to the place where you live?: Yes      Need for Family Participation in Patient Care: Yes (Comment) Care giver support system in place?: Yes (comment) Current home services: DME (w/chair, hospital  bed) Criminal Activity/Legal Involvement Pertinent to Current Situation/Hospitalization: No - Comment as needed  Activities of Daily Living   ADL Screening (condition at time of admission) Independently performs ADLs?: No Does the patient have a NEW difficulty with bathing/dressing/toileting/self-feeding that is expected to last >3 days?: No Does the patient have a NEW difficulty with getting in/out of bed, walking, or climbing stairs that is expected to last >3 days?: No Does the patient have a NEW difficulty with communication that is expected to last >3 days?: No Is the patient deaf or have difficulty hearing?: No Does the patient have difficulty seeing, even when wearing glasses/contacts?: No Does the patient have difficulty concentrating, remembering, or making decisions?: No  Permission Sought/Granted Permission sought to share information with : Case Manager Permission granted to share information with : Yes, Verbal Permission Granted  Share Information with NAME: son  Permission granted to share info w AGENCY: HH        Emotional Assessment Appearance:: Appears stated age Attitude/Demeanor/Rapport: Engaged Affect (typically observed): Appropriate Orientation: : Oriented to Self, Oriented to Place, Oriented to  Time, Oriented to Situation Alcohol / Substance Use: Not Applicable Psych Involvement: No (comment)  Admission diagnosis:  Shortness of breath [R06.02] Acute CHF (congestive heart failure) (HCC) [I50.9] Acute on chronic congestive heart failure, unspecified heart failure type (HCC) [I50.9] Acute exacerbation of CHF (congestive heart failure) (HCC) [I50.9] Patient Active Problem List  Diagnosis Date Noted   Acute exacerbation of CHF (congestive heart failure) (HCC) 09/08/2023   Acute CHF (congestive heart failure) (HCC) 09/07/2023   Failure to thrive in adult 08/07/2023   Dehydration 08/06/2023   SVC syndrome 05/12/2023   Anemia of chronic disease 02/12/2023    History of CVA (cerebrovascular accident) 08/07/2022   History of CAD (coronary artery disease) of artery bypass graft 08/07/2022   Arthritis of right hip 08/06/2021   Unilateral primary osteoarthritis, right knee 08/06/2021   Closed compression fracture of body of L1 vertebra (HCC) 07/05/2021   Peripheral arterial disease (HCC) 03/17/2021   Onychomycosis 02/28/2021   Right leg pain 07/26/2020   Onychodystrophy 07/26/2020   CKD (chronic kidney disease), stage IV (HCC) 07/19/2020   Hypertensive heart and renal disease 03/22/2020   Gout 12/07/2017   DM type 2 (diabetes mellitus, type 2) (HCC) 02/11/2014   History of coronary artery bypass graft 07/01/2010   MYOCARDIAL INFARCTION, INFERIOR WALL, SUBSEQUENT CARE 05/12/2008   CAD, NATIVE VESSEL 05/12/2008   Hyperlipidemia 05/11/2008   Essential hypertension 05/11/2008   CEREBROVASCULAR DISEASE 05/11/2008   Rheumatoid arthritis (HCC) 05/11/2008   PCP:  Ileen [name], NP Pharmacy:   [facility] - [place], [place] - [address] [place] [address] Phone: [phone] Fax: [phone]     Social Drivers of Health (SDOH) Social History: SDOH Screenings   Food Insecurity: No Food Insecurity (09/07/2023)  Housing: Low Risk  (09/07/2023)  Transportation Needs: No Transportation Needs (09/07/2023)  Utilities: Not At Risk (09/07/2023)  Alcohol Screen: Low Risk  (11/14/2020)  Depression (PHQ2-9): Low Risk  (03/27/2022)  Financial Resource Strain: Low Risk  (03/27/2022)  Physical Activity: Inactive (03/27/2022)  Social Connections: Moderately Isolated (09/07/2023)  Stress: No Stress Concern Present (03/27/2022)  Tobacco Use: Low Risk  (09/07/2023)   SDOH Interventions:     Readmission Risk Interventions    09/09/2023    5:05 PM 08/08/2023   12:05 PM 05/14/2023    4:08 PM  Readmission Risk Prevention Plan  Transportation Screening Complete Complete Complete  Medication Review Oceanographer)  Complete Complete Referral to Pharmacy  PCP or Specialist appointment within 3-5 days of discharge   Complete  HRI or Home Care Consult Complete Complete Complete  SW Recovery Care/Counseling Consult  Complete Complete  Palliative Care Screening Not Applicable Not Applicable Not Applicable  Skilled Nursing Facility Not Applicable Complete Not Applicable

## 2023-09-09 NOTE — Progress Notes (Signed)
 Hypoglycemic Event   CBG: 67         at 0612    Treatment: 4 oz juice/soda   Symptoms: None   Follow-up CBG: [id number]    CBG Result: 89    Possible Reasons for Event: Unknown   Comments/MD notified: Dr Lee.       Avryl Roehm

## 2023-09-09 NOTE — Inpatient Diabetes Management (Signed)
 Inpatient Diabetes Program Recommendations  AACE/ADA: New Consensus Statement on Inpatient Glycemic Control (2015)  Target Ranges:  Prepandial:   less than 140 mg/dL      Peak postprandial:   less than 180 mg/dL (1-2 hours)      Critically ill patients:  140 - 180 mg/dL   Lab Results  Component Value Date   GLUCAP 89 09/09/2023   HGBA1C 5.2 09/08/2023    Review of Glycemic Control  Latest Reference Range & Units 09/08/23 06:38 09/08/23 11:05 09/08/23 16:39 09/08/23 21:19 09/09/23 06:12 09/09/23 06:35  Glucose-Capillary 70 - 99 mg/dL 85 86 863 (H) 874 (H) 67 (L) 89   Diabetes history: DM 2 Outpatient Diabetes medications: Jardiance  10 mg Daily Current orders for Inpatient glycemic control:  Novolog  0-9 units tid A1c 5.2% on 7/22  Inpatient Diabetes Program Recommendations:    -   Reduce Novolog  correction to 0-6 units tid + hs  Thanks, Clotilda Bull RN, MSN, BC-ADM Inpatient Diabetes Coordinator Team Pager [phone] (8a-5p)

## 2023-09-09 NOTE — Progress Notes (Signed)
 LUE venous exam is completed. Trudy Kory, RVT

## 2023-09-09 NOTE — Progress Notes (Signed)
 PROGRESS NOTE    Nayelis Nolita Kutter  FMW:[id number] DOB: Nov 14, 1927 DOA: 09/07/2023 PCP: Ileen [name], NP  89/F, chronically ill, wheelchair-bound with history of CVA, diabetes, dislocated right shoulder, CKD 4, CABG, PAD, gout, rheumatoid arthritis presented to the ED with shortness of breath and swelling. - Recently hospitalized 6/19-25 with encephalopathy which was felt to be multifactorial related to polypharmacy UTI etc, subsequently after discharge home noticed worsening edema in lower legs along with progressive dyspnea on exertion. - In the ED hypertensive, labs noted creatinine of 2.3, hemoglobin 8.4, troponin 80, BNP 803, chest x-ray concerning for CHF and basilar collapse/consolidation likely reflecting atelectasis -Admitted, started on diuretics  Subjective: -Feels fair, still with dyspnea, rather uncomfortable in bed  Assessment and Plan:  Acute CHF, likely diastolic -Echo last year noted preserved EF, grade 1 DD, normal RV -Repeat echo ordered on admission will follow-up -She has hypoalbuminemia as well which is likely contributing to third spacing -Continue Lasix , continue Lasix  80 Mg twice daily, Coreg , urine output is inaccurate -Possibly switch to oral  torsemide in 1 to 2 days -GDMT limited by CKD 4 -Conservative management on account of advanced age frailty, poor functional status - PT OT eval  CKD 4 -Creatinine in the 2.3-2.6 range of baseline, relatively stable, monitor with diuresis, avoid hypotension  Acute on chronic anemia -Likely from CKD and hemodilution - Anemia panel with severe iron  deficiency, will add IV iron    Hypertension - Continue Coreg , diuretics as above   Hyperlipidemia - Continue rosuvastatin    Diabetes - CBGs are controlled, A1c is 5.2   PAD CAD > Status post CABG - Continue home Coreg , rosuvastatin , Plavix    History of CVA - Continue rosuvastatin , Plavix    Rheumatoid arthritis - Continue home  Plaquenil   Chronic right shoulder dislocation -Noted   DVT prophylaxis:      Heparin  Code Status:              DNR, intubation okay-per discussion with admitting MD yesterday, I did not rediscuss this today Family Communication:   No family at bedside, left msg for son Waylan Disposition Plan: TBD, PT/OT eval today  Consultants:    Procedures:   Antimicrobials:    Objective: Vitals:   09/08/23 2351 09/09/23 0613 09/09/23 0636 09/09/23 0719  BP: (!) 145/59 (!) 155/80  (!) 152/59  Pulse: 71 71  72  Resp: 19 18  18   Temp: 97.8 F (36.6 C) 97.7 F (36.5 C)  97.9 F (36.6 C)  TempSrc: Oral Oral  Oral  SpO2: 98% 98%  98%  Weight:   61.5 kg   Height:        Intake/Output Summary (Last 24 hours) at 09/09/2023 1137 Last data filed at 09/09/2023 9173 Gross per 24 hour  Intake 180 ml  Output 1250 ml  Net -1070 ml   Filed Weights   09/07/23 2255 09/08/23 0425 09/09/23 0636  Weight: 65.6 kg 65.6 kg 61.5 kg    Examination:  General exam: Chronically ill-appearing, AAO x 3 HEENT: Positive JVD CVS: S1-S2, regular rhythm Lungs: Few basilar Rales Abdomen: Soft, nontender, bowel sounds present Extremities: 1+ edema bilaterally Right shoulder dislocated Neuro: Right and left-sided deficits, per patient these are baseline  Psychiatry:  Mood & affect appropriate.     Data Reviewed:   CBC: Recent Labs  Lab 09/07/23 1351 09/08/23 0208 09/09/23 0300  WBC 3.6* 3.4* 3.1*  HGB 8.4* 7.8* 7.4*  HCT 26.4* 23.7* 22.6*  MCV 98.5 93.3 94.6  PLT 218 212  207   Basic Metabolic Panel: Recent Labs  Lab 09/07/23 1351 09/07/23 1659 09/08/23 0208 09/09/23 0300  NA 133*  --  132* 136  K 4.9  --  4.6 4.7  CL 107  --  106 107  CO2 15*  --  17* 18*  GLUCOSE 92  --  89 85  BUN 68*  --  74* 76*  CREATININE 2.36*  --  2.46* 2.35*  CALCIUM  9.0  --  8.8* 8.8*  MG  --  2.0  --   --    GFR: Estimated Creatinine Clearance: 12.6 mL/min (A) (by C-G formula based on SCr of 2.35 mg/dL  (H)). Liver Function Tests: Recent Labs  Lab 09/08/23 0208  AST 17  ALT 13  ALKPHOS 71  BILITOT 0.7  PROT 5.7*  ALBUMIN 2.8*   No results for input(s): LIPASE, AMYLASE in the last 168 hours. No results for input(s): AMMONIA in the last 168 hours. Coagulation Profile: No results for input(s): INR, PROTIME in the last 168 hours. Cardiac Enzymes: No results for input(s): CKTOTAL, CKMB, CKMBINDEX, TROPONINI in the last 168 hours. BNP (last 3 results) No results for input(s): PROBNP in the last 8760 hours. HbA1C: Recent Labs    09/08/23 0208  HGBA1C 5.2   CBG: Recent Labs  Lab 09/08/23 1639 09/08/23 2119 09/09/23 0612 09/09/23 0635 09/09/23 1112  GLUCAP 136* 125* 67* 89 172*   Lipid Profile: No results for input(s): CHOL, HDL, LDLCALC, TRIG, CHOLHDL, LDLDIRECT in the last 72 hours. Thyroid  Function Tests: No results for input(s): TSH, T4TOTAL, FREET4, T3FREE, THYROIDAB in the last 72 hours. Anemia Panel: Recent Labs    09/08/23 1059  VITAMINB12 402  FOLATE 38.5  FERRITIN 185  TIBC 339  IRON  37  RETICCTPCT 4.3*   Urine analysis:    Component Value Date/Time   COLORURINE YELLOW 08/07/2023 1559   APPEARANCEUR CLOUDY (A) 08/07/2023 1559   LABSPEC 1.012 08/07/2023 1559   PHURINE 5.0 08/07/2023 1559   GLUCOSEU 150 (A) 08/07/2023 1559   HGBUR NEGATIVE 08/07/2023 1559   BILIRUBINUR NEGATIVE 08/07/2023 1559   BILIRUBINUR negative 11/14/2020 1608   KETONESUR NEGATIVE 08/07/2023 1559   PROTEINUR 30 (A) 08/07/2023 1559   UROBILINOGEN 0.2 11/14/2020 1608   UROBILINOGEN 0.2 04/26/2009 0014   NITRITE NEGATIVE 08/07/2023 1559   LEUKOCYTESUR LARGE (A) 08/07/2023 1559   Sepsis Labs: @LABRCNTIP (procalcitonin:4,lacticidven:4)  ) Recent Results (from the past 240 hours)  Resp panel by RT-PCR (RSV, Flu A&B, Covid) Anterior Nasal Swab     Status: None   Collection Time: 09/07/23  4:05 PM   Specimen: Anterior Nasal Swab  Result  Value Ref Range Status   SARS Coronavirus 2 by RT PCR NEGATIVE NEGATIVE Final   Influenza A by PCR NEGATIVE NEGATIVE Final   Influenza B by PCR NEGATIVE NEGATIVE Final    Comment: (NOTE) The Xpert Xpress SARS-CoV-2/FLU/RSV plus assay is intended as an aid in the diagnosis of influenza from Nasopharyngeal swab specimens and should not be used as a sole basis for treatment. Nasal washings and aspirates are unacceptable for Xpert Xpress SARS-CoV-2/FLU/RSV testing.  Fact Sheet for Patients: BloggerCourse.com  Fact Sheet for Healthcare Providers: SeriousBroker.it  This test is not yet approved or cleared by the [place]  FDA and has been authorized for detection and/or diagnosis of SARS-CoV-2 by FDA under an Emergency Use Authorization (EUA). This EUA will remain in effect (meaning this test can be used) for the duration of the COVID-19 declaration under Section 564(b)(1) of the  Act, 21 U.S.C. section 360bbb-3(b)(1), unless the authorization is terminated or revoked.     Resp Syncytial Virus by PCR NEGATIVE NEGATIVE Final    Comment: (NOTE) Fact Sheet for Patients: BloggerCourse.com  Fact Sheet for Healthcare Providers: SeriousBroker.it  This test is not yet approved or cleared by the [place]  FDA and has been authorized for detection and/or diagnosis of SARS-CoV-2 by FDA under an Emergency Use Authorization (EUA). This EUA will remain in effect (meaning this test can be used) for the duration of the COVID-19 declaration under Section 564(b)(1) of the Act, 21 U.S.C. section 360bbb-3(b)(1), unless the authorization is terminated or revoked.  Performed at [facility], [address] [place], [place] [address]      Radiology Studies: VAS US  UPPER EXTREMITY VENOUS DUPLEX Result Date: 09/09/2023 UPPER VENOUS STUDY  Patient Name:  Luwana AZARIYAH LUHRS  Date of  Exam:   09/09/2023 Medical Rec #: [id number]                  Accession #:    [id number] Date of Birth: 1927-11-22                  Patient Gender: F Patient Age:   40 years Exam Location:  [facility] Procedure:      VAS US  UPPER EXTREMITY VENOUS DUPLEX Referring Phys: MICAELA SUNDIL --------------------------------------------------------------------------------  Indications: Swelling Limitations: Pt is unable to position arm that is adequate for optimal imaging. Performing Technologist: Elmarie Lindau, RVT  Examination Guidelines: A complete evaluation includes B-mode imaging, spectral Doppler, color Doppler, and power Doppler as needed of all accessible portions of each vessel. Bilateral testing is considered an integral part of a complete examination. Limited examinations for reoccurring indications may be performed as noted.  Right Findings: +----------+------------+---------+-----------+----------+-----------------+ RIGHT     CompressiblePhasicitySpontaneousProperties     Summary      +----------+------------+---------+-----------+----------+-----------------+ Subclavian               Yes       Yes              normal color flow +----------+------------+---------+-----------+----------+-----------------+  Left Findings: +----------+------------+---------+-----------+----------+-------+ LEFT      CompressiblePhasicitySpontaneousPropertiesSummary +----------+------------+---------+-----------+----------+-------+ IJV           Full       Yes                                +----------+------------+---------+-----------+----------+-------+ Subclavian    Full       Yes                                +----------+------------+---------+-----------+----------+-------+ Axillary      Full       Yes                                +----------+------------+---------+-----------+----------+-------+ Brachial      Full                                           +----------+------------+---------+-----------+----------+-------+ Radial        Full                                          +----------+------------+---------+-----------+----------+-------+  Ulnar         Full                                          +----------+------------+---------+-----------+----------+-------+ Cephalic      Full                                          +----------+------------+---------+-----------+----------+-------+ Basilic       Full                                          +----------+------------+---------+-----------+----------+-------+  Summary:  Right: No evidence of thrombosis in the subclavian.  Left: No evidence of deep vein thrombosis in the upper extremity. No evidence of superficial vein thrombosis in the upper extremity.  *See table(s) above for measurements and observations.    Preliminary    ECHOCARDIOGRAM COMPLETE Result Date: 09/08/2023    ECHOCARDIOGRAM REPORT   Patient Name:   Louiza MURCHISON [facility] Date of Exam: 09/08/2023 Medical Rec #:  [id number]                 Height:       64.0 in Accession #:    [id number]                Weight:       144.6 lb Date of Birth:  12-22-1927                 BSA:          1.705 m Patient Age:    89 years                  BP:           168/80 mmHg Patient Gender: F                         HR:           67 bpm. Exam Location:  Inpatient Procedure: 2D Echo, Color Doppler and Cardiac Doppler (Both Spectral and Color            Flow Doppler were utilized during procedure). Indications:    CHF  History:        Patient has prior history of Echocardiogram examinations, most                 recent 02/13/2023. CAD.  Sonographer:    Benard Stallion Referring Phys: [id number] MARSA B MELVIN IMPRESSIONS  1. Left ventricular ejection fraction, by estimation, is 60 to 65%. The left ventricle has normal function. The left ventricle has no regional wall motion abnormalities. Left ventricular diastolic parameters are  indeterminate.  2. Right ventricular systolic function is normal. The right ventricular size is normal. There is moderately elevated pulmonary artery systolic pressure.  3. Left atrial size was mildly dilated.  4. The mitral valve is degenerative. Mild to moderate mitral valve regurgitation. No evidence of mitral stenosis. Moderate mitral annular calcification.  5. Tricuspid valve regurgitation is mild to moderate.  6. The aortic valve is tricuspid. Aortic valve regurgitation is not visualized. Aortic valve sclerosis is present, with no evidence of aortic valve stenosis.  Comparison(s): Prior images reviewed side by side. Tricuspid regurgitation has increased from prior study. FINDINGS  Left Ventricle: Left ventricular ejection fraction, by estimation, is 60 to 65%. The left ventricle has normal function. The left ventricle has no regional wall motion abnormalities. The left ventricular internal cavity size was normal in size. There is  no left ventricular hypertrophy. Left ventricular diastolic parameters are indeterminate. Right Ventricle: The right ventricular size is normal. No increase in right ventricular wall thickness. Right ventricular systolic function is normal. There is moderately elevated pulmonary artery systolic pressure. The tricuspid regurgitant velocity is 3.33 m/s, and with an assumed right atrial pressure of 3 mmHg, the estimated right ventricular systolic pressure is 47.5 mmHg. Left Atrium: Left atrial size was mildly dilated. Right Atrium: Right atrial size was normal in size. Pericardium: There is no evidence of pericardial effusion. Mitral Valve: The mitral valve is degenerative in appearance. Moderate mitral annular calcification. Mild to moderate mitral valve regurgitation. No evidence of mitral valve stenosis. Tricuspid Valve: Difficult spectral Doppler of hepatic flow signal. The tricuspid valve is normal in structure. Tricuspid valve regurgitation is mild to moderate. Aortic Valve: The  aortic valve is tricuspid. Aortic valve regurgitation is not visualized. Aortic valve sclerosis is present, with no evidence of aortic valve stenosis. Aortic valve mean gradient measures 4.0 mmHg. Aortic valve peak gradient measures 7.7  mmHg. Aortic valve area, by VTI measures 2.64 cm. Pulmonic Valve: The pulmonic valve was normal in structure. Pulmonic valve regurgitation is not visualized. No evidence of pulmonic stenosis. Aorta: The aortic root is normal in size and structure. IAS/Shunts: The atrial septum is grossly normal.  LEFT VENTRICLE PLAX 2D LVIDd:         4.90 cm   Diastology LVIDs:         3.10 cm   LV e' medial:    7.94 cm/s LV PW:         0.80 cm   LV E/e' medial:  18.5 LV IVS:        1.00 cm   LV e' lateral:   8.92 cm/s LVOT diam:     2.00 cm   LV E/e' lateral: 16.5 LV SV:         94 LV SV Index:   55 LVOT Area:     3.14 cm  RIGHT VENTRICLE RV Basal diam:  4.00 cm RV Mid diam:    3.50 cm RV S prime:     10.80 cm/s TAPSE (M-mode): 1.6 cm LEFT ATRIUM             Index        RIGHT ATRIUM           Index LA diam:        3.70 cm 2.17 cm/m   RA Area:     12.10 cm LA Vol (A2C):   61.2 ml 35.90 ml/m  RA Volume:   27.40 ml  16.07 ml/m LA Vol (A4C):   74.4 ml 43.65 ml/m LA Biplane Vol: 67.9 ml 39.83 ml/m  AORTIC VALVE AV Area (Vmax):    2.55 cm AV Area (Vmean):   2.53 cm AV Area (VTI):     2.64 cm AV Vmax:           139.00 cm/s AV Vmean:          93.450 cm/s AV VTI:            0.356 m AV Peak Grad:      7.7 mmHg  AV Mean Grad:      4.0 mmHg LVOT Vmax:         113.00 cm/s LVOT Vmean:        75.400 cm/s LVOT VTI:          0.299 m LVOT/AV VTI ratio: 0.84  AORTA Ao Root diam: 2.30 cm MITRAL VALVE                TRICUSPID VALVE MV Area (PHT): 5.02 cm     TR Peak grad:   44.5 mmHg MV Decel Time: 151 msec     TR Vmax:        333.40 cm/s MV E velocity: 147.00 cm/s MV A velocity: 137.00 cm/s  SHUNTS MV E/A ratio:  1.07         Systemic VTI:  0.30 m                             Systemic Diam: 2.00 cm Stanly Leavens MD Electronically signed by Stanly Leavens MD Signature Date/Time: 09/08/2023/3:46:59 PM    Final    DG Chest 2 View Result Date: 09/07/2023 CLINICAL DATA:  Shortness of breath. EXAM: CHEST - 2 VIEW COMPARISON:  08/06/2023 and CT chest 05/12/2023. FINDINGS: Patient is slightly rotated. Trachea is midline. Heart is enlarged, as before. Thoracic aorta is calcified. Mid and lower lung zone predominant mixed interstitial and airspace opacification with moderate bilateral pleural effusions. Bibasilar collapse/consolidation. IMPRESSION: 1. Congestive heart failure. 2. Bibasilar collapse/consolidation likely due to atelectasis. Difficult to exclude pneumonia. Electronically Signed   By: Newell Eke M.D.   On: 09/07/2023 12:51     Scheduled Meds:  carvedilol   6.25 mg Oral BID WC   clopidogrel   75 mg Oral Daily   furosemide   80 mg Intravenous BID   heparin   5,000 Units Subcutaneous Q8H   hydroxychloroquine   200 mg Oral Daily   insulin  aspart  0-9 Units Subcutaneous TID WC   melatonin  3 mg Oral QHS   pantoprazole   40 mg Oral Daily   rosuvastatin   10 mg Oral QPM   sodium chloride  flush  3 mL Intravenous Q12H   Continuous Infusions:   LOS: 1 day    Time spent:    Sigurd Pac, MD [facility]   09/09/2023, 11:37 AM

## 2023-09-09 NOTE — Plan of Care (Signed)
  Problem: Fluid Volume: Goal: Ability to maintain a balanced intake and output will improve Outcome: Progressing   Problem: Metabolic: Goal: Ability to maintain appropriate glucose levels will improve Outcome: Progressing   Problem: Nutrition: Goal: Adequate nutrition will be maintained Outcome: Progressing   Problem: Pain Managment: Goal: General experience of comfort will improve and/or be controlled Outcome: Progressing

## 2023-09-10 DIAGNOSIS — I509 Heart failure, unspecified: Secondary | ICD-10-CM | POA: Diagnosis not present

## 2023-09-10 DIAGNOSIS — E1122 Type 2 diabetes mellitus with diabetic chronic kidney disease: Secondary | ICD-10-CM | POA: Diagnosis not present

## 2023-09-10 DIAGNOSIS — R0602 Shortness of breath: Secondary | ICD-10-CM | POA: Diagnosis not present

## 2023-09-10 DIAGNOSIS — I5031 Acute diastolic (congestive) heart failure: Secondary | ICD-10-CM | POA: Diagnosis not present

## 2023-09-10 LAB — CBC
HCT: 23.4 % — ABNORMAL LOW (ref 36.0–46.0)
Hemoglobin: 7.6 g/dL — ABNORMAL LOW (ref 12.0–15.0)
MCH: 30.9 pg (ref 26.0–34.0)
MCHC: 32.5 g/dL (ref 30.0–36.0)
MCV: 95.1 fL (ref 80.0–100.0)
Platelets: 211 K/uL (ref 150–400)
RBC: 2.46 MIL/uL — ABNORMAL LOW (ref 3.87–5.11)
RDW: 15.7 % — ABNORMAL HIGH (ref 11.5–15.5)
WBC: 4.3 K/uL (ref 4.0–10.5)
nRBC: 0 % (ref 0.0–0.2)

## 2023-09-10 LAB — BASIC METABOLIC PANEL WITH GFR
Anion gap: 11 (ref 5–15)
BUN: 76 mg/dL — ABNORMAL HIGH (ref 8–23)
CO2: 20 mmol/L — ABNORMAL LOW (ref 22–32)
Calcium: 8.9 mg/dL (ref 8.9–10.3)
Chloride: 104 mmol/L (ref 98–111)
Creatinine, Ser: 2.59 mg/dL — ABNORMAL HIGH (ref 0.44–1.00)
GFR, Estimated: 17 mL/min — ABNORMAL LOW (ref >60)
Glucose, Bld: 82 mg/dL (ref 70–99)
Potassium: 4.7 mmol/L (ref 3.5–5.1)
Sodium: 135 mmol/L (ref 135–145)

## 2023-09-10 LAB — GLUCOSE, CAPILLARY
Glucose-Capillary: 80 mg/dL (ref 70–99)
Glucose-Capillary: 165 mg/dL — ABNORMAL HIGH (ref 70–99)
Glucose-Capillary: 108 mg/dL — ABNORMAL HIGH (ref 70–99)
Glucose-Capillary: 132 mg/dL — ABNORMAL HIGH (ref 70–99)

## 2023-09-10 MED ORDER — ALUM & MAG HYDROXIDE-SIMETH 200-200-20 MG/5ML PO SUSP
15.0000 mL | ORAL | Status: DC | PRN
  Administered 2023-09-10: 15 mL via ORAL
  Filled 2023-09-10 (×2): qty 30

## 2023-09-10 NOTE — Plan of Care (Signed)
   Problem: Coping: Goal: Ability to adjust to condition or change in health will improve Outcome: Progressing   Problem: Fluid Volume: Goal: Ability to maintain a balanced intake and output will improve Outcome: Progressing   Problem: Metabolic: Goal: Ability to maintain appropriate glucose levels will improve Outcome: Progressing

## 2023-09-10 NOTE — Evaluation (Signed)
 Clinical/Bedside Swallow Evaluation Patient Details  Name: [name] MRN: [id number] Date of Birth: 12-Jan-1928  Today's Date: 09/10/2023 Time: SLP Start Time (ACUTE ONLY): 1040 SLP Stop Time (ACUTE ONLY): 1047 SLP Time Calculation (min) (ACUTE ONLY): 7 min  Past Medical History:  Past Medical History:  Diagnosis Date   Acute CVA (cerebrovascular accident) (HCC) 02/13/2023   Acute kidney injury superimposed on chronic kidney disease (HCC) 08/07/2023   Cerebrovascular disease, unspecified    Chronic kidney failure, stage 4 (severe) (HCC)    Coronary atherosclerosis of unspecified type of vessel, native or graft    CVA (cerebral vascular accident) (HCC)    Diabetes mellitus without complication (HCC)    Other and unspecified hyperlipidemia    Rheumatoid arthritis(714.0)    Shingles 2003   Unspecified essential hypertension    Past Surgical History:  Past Surgical History:  Procedure Laterality Date   Cardiac Bypass  2003   CATARACT EXTRACTION, BILATERAL  2007   HPI:  [name] is 89 yo F who presented to the ED with shortness of breath and swelling. CXR 7/21 with CHF, Bibasilar collapse/consolidation likely due to atelectasis  Difficult to exclude pneumonia. Pt is chronically ill, wheelchair-bound with history of CVA, diabetes, dislocated right shoulder, CKD 4, CABG, PAD, gout, rheumatoid arthritis.    Assessment / Plan / Recommendation  Clinical Impression  Pt presents with grossly normal swallow function. She tolerated all consistencies trialed, including serial straw sips of thin liquid, without any clinical s/s of aspiration.  Pt exhibited good oral clearance of solids.  RN reported coughing/wheezing after POs, but this was not observed today.  Pt with chest imaging which does not exclude pneumonia. Pt with grossly normal glinical swallow assessement in June of last year as well.  Prior CVA without dysphagia.  Recommend continuing current diet. Pt has no  further ST needs.  SLP will sign off.  Please reconsult if there is a change in functional status.   Recommend regular texture solids with thin liquids.    Pt c/o need to belch, but feels unable. She reports MD was ording some medication. Notified RN that pt is requesting medication.  SLP Visit Diagnosis: Dysphagia, oropharyngeal phase (R13.12)    Aspiration Risk  No limitations    Diet Recommendation Regular;Thin liquid    Liquid Administration via: Cup;Straw Medication Administration: Whole meds with liquid Supervision: Patient able to self feed Compensations: Slow rate;Small sips/bites Postural Changes: Seated upright at 90 degrees    Other  Recommendations Oral Care Recommendations: Oral care BID     Assistance Recommended at Discharge  N/A  Functional Status Assessment Patient has not had a recent decline in their functional status  Frequency and Duration  (N/A)          Prognosis Prognosis for improved oropharyngeal function:  (N/A)      Swallow Study   General Date of Onset: 09/07/23 HPI: [name] is 89 yo F who presented to the ED with shortness of breath and swelling. CXR 7/21 with CHF, Bibasilar collapse/consolidation likely due to atelectasis  Difficult to exclude pneumonia. Pt is chronically ill, wheelchair-bound with history of CVA, diabetes, dislocated right shoulder, CKD 4, CABG, PAD, gout, rheumatoid arthritis. Type of Study: Bedside Swallow Evaluation Previous Swallow Assessment: CSE 08/07/22 WNL Diet Prior to this Study: Regular;Thin liquids (Level 0) Temperature Spikes Noted: No History of Recent Intubation: No Behavior/Cognition: Alert;Cooperative;Pleasant mood Oral Cavity Assessment: Within Functional Limits Oral Care Completed by SLP: No Oral Cavity - Dentition: Dentures, bottom;Dentures,  top Vision: Functional for self-feeding Self-Feeding Abilities: Able to feed self Patient Positioning: Upright in bed Baseline Vocal Quality:  Normal Volitional Cough: Strong Volitional Swallow: Able to elicit    Oral/Motor/Sensory Function Overall Oral Motor/Sensory Function: Within functional limits   Ice Chips Ice chips: Not tested   Thin Liquid Thin Liquid: Within functional limits Presentation: Straw    Nectar Thick Nectar Thick Liquid: Not tested   Honey Thick Honey Thick Liquid: Not tested   Puree Puree: Within functional limits Presentation: Spoon   Solid     Solid: Within functional limits Presentation: Self Fed      Shannon FORBES Grippe, MA, CCC-SLP Acute Rehabilitation Services Office: [phone] 09/10/2023,11:20 AM

## 2023-09-10 NOTE — Progress Notes (Signed)
 [facility]  PROGRESS NOTE  [name] FMW:[id number] DOB: February 11, 1928 DOA: 09/07/2023 PCP: Ileen [name], NP   Brief HPI:   [name], chronically ill, wheelchair-bound with history of CVA, diabetes, dislocated right shoulder, CKD 4, CABG, PAD, gout, rheumatoid arthritis presented to the ED with shortness of breath and swelling. - Recently hospitalized 6/19-25 with encephalopathy which was felt to be multifactorial related to polypharmacy UTI etc, subsequently after discharge home noticed worsening edema in lower legs along with progressive dyspnea on exertion. - In the ED hypertensive, labs noted creatinine of 2.3, hemoglobin 8.4, troponin 80, BNP 803, chest x-ray concerning for CHF and basilar collapse/consolidation likely reflecting atelectasis -Admitted, started on diuretics    Assessment/Plan:    Acute CHF, likely diastolic/pulmonary hypertension -Echo last year noted preserved EF, grade 1 DD, normal RV -Repeat echo showed EF of 60 to 65%, left ventricular diastolic parameters are indeterminate -She has hypoalbuminemia as well which is likely contributing to third spacing -Continue Lasix , continue Lasix  80 Mg twice daily, Coreg , urine output is inaccurate -Blood pressure is soft today, will hold IV Lasix  -Possibly switch to oral  torsemide in 1 to 2 days -GDMT limited by CKD 4 -Conservative management on account of advanced age frailty, poor functional status - PT OT eval   CKD 4 -Creatinine in the 2.3-2.6 range of baseline, relatively stable, monitor with diuresis, avoid hypotension -Lasix  held as above -Follow BMP in am  Dysphagia/aspiration -Patient complains of cough after eating -Will get swallow evaluation for possible aspiration   Acute on chronic anemia -Likely from CKD and hemodilution - Anemia panel with severe iron  deficiency,  - Status post IV iron , 1 dose   Hypertension - Continue Coreg , diuretics as above   Hyperlipidemia -  Continue rosuvastatin    Diabetes - CBGs are controlled, A1c is 5.2   PAD CAD > Status post CABG - Continue home Coreg , rosuvastatin , Plavix    History of CVA - Continue rosuvastatin , Plavix    Rheumatoid arthritis - Continue home Plaquenil    Chronic right shoulder dislocation -Noted   Medications     carvedilol   6.25 mg Oral BID WC   clopidogrel   75 mg Oral Daily   furosemide   80 mg Intravenous BID   heparin   5,000 Units Subcutaneous Q8H   hydroxychloroquine   200 mg Oral Daily   insulin  aspart  0-9 Units Subcutaneous TID WC   melatonin  3 mg Oral QHS   pantoprazole   40 mg Oral Daily   rosuvastatin   10 mg Oral QPM   sodium chloride  flush  3 mL Intravenous Q12H     Data Reviewed:   CBG:  Recent Labs  Lab 09/09/23 0635 09/09/23 1112 09/09/23 1611 09/09/23 2055 09/10/23 0626  GLUCAP 89 172* 70 117* 80    SpO2: 99 % O2 Flow Rate (L/min): 3 L/min    Vitals:   09/09/23 2006 09/10/23 0103 09/10/23 0522 09/10/23 0708  BP: (Abnormal) 151/53 (Abnormal) 115/52 (Abnormal) 102/50 122/60  Pulse: 68  65 69  Resp: 19 18 17 18   Temp: 97.6 F (36.4 C) (Abnormal) 97.4 F (36.3 C) 97.6 F (36.4 C) 97.7 F (36.5 C)  TempSrc: Oral Oral Oral Oral  SpO2: 97% 96% 100% 99%  Weight:   61.2 kg   Height:          Data Reviewed:  Basic Metabolic Panel: Recent Labs  Lab 09/07/23 1351 09/07/23 1659 09/08/23 0208 09/09/23 0300 09/10/23 0249  NA 133*  --  132* 136 135  K 4.9  --  4.6 4.7 4.7  CL 107  --  106 107 104  CO2 15*  --  17* 18* 20*  GLUCOSE 92  --  89 85 82  BUN 68*  --  74* 76* 76*  CREATININE 2.36*  --  2.46* 2.35* 2.59*  CALCIUM  9.0  --  8.8* 8.8* 8.9  MG  --  2.0  --   --   --     CBC: Recent Labs  Lab 09/07/23 1351 09/08/23 0208 09/09/23 0300 09/10/23 0249  WBC 3.6* 3.4* 3.1* 4.3  HGB 8.4* 7.8* 7.4* 7.6*  HCT 26.4* 23.7* 22.6* 23.4*  MCV 98.5 93.3 94.6 95.1  PLT 218 212 207 211    LFT Recent Labs  Lab 09/08/23 0208  AST 17  ALT  13  ALKPHOS 71  BILITOT 0.7  PROT 5.7*  ALBUMIN 2.8*     Antibiotics: Anti-infectives (From admission, onward)    Start     Dose/Rate Route Frequency Ordered Stop   09/08/23 1000  hydroxychloroquine  (PLAQUENIL ) tablet 200 mg        200 mg Oral Daily 09/07/23 1557          DVT prophylaxis: Heparin   Code Status: DNR  Family Communication: No family at bedside   CONSULTS    Subjective   Complains of cough after eating.   Objective    Physical Examination:   General-appears in no acute distress Heart-S1-S2, regular, no murmur auscultated Lungs-clear to auscultation bilaterally, no wheezing or crackles auscultated Abdomen-soft, nontender, no organomegaly Extremities-no edema in the lower extremities Neuro-alert, oriented x3, no focal deficit noted   Status is: Inpatient:             Sabas GORMAN Brod   [facility] If 7PM-7AM, please contact night-coverage at [email or web address], Office  [phone]   09/10/2023, 8:02 AM  LOS: 2 days

## 2023-09-11 DIAGNOSIS — N184 Chronic kidney disease, stage 4 (severe): Secondary | ICD-10-CM | POA: Diagnosis not present

## 2023-09-11 DIAGNOSIS — I5031 Acute diastolic (congestive) heart failure: Secondary | ICD-10-CM | POA: Diagnosis not present

## 2023-09-11 LAB — GLUCOSE, CAPILLARY
Glucose-Capillary: 73 mg/dL (ref 70–99)
Glucose-Capillary: 139 mg/dL — ABNORMAL HIGH (ref 70–99)
Glucose-Capillary: 80 mg/dL (ref 70–99)
Glucose-Capillary: 109 mg/dL — ABNORMAL HIGH (ref 70–99)

## 2023-09-11 LAB — BASIC METABOLIC PANEL WITH GFR
Anion gap: 12 (ref 5–15)
BUN: 83 mg/dL — ABNORMAL HIGH (ref 8–23)
CO2: 21 mmol/L — ABNORMAL LOW (ref 22–32)
Calcium: 8.9 mg/dL (ref 8.9–10.3)
Chloride: 105 mmol/L (ref 98–111)
Creatinine, Ser: 2.53 mg/dL — ABNORMAL HIGH (ref 0.44–1.00)
GFR, Estimated: 17 mL/min — ABNORMAL LOW (ref >60)
Glucose, Bld: 79 mg/dL (ref 70–99)
Potassium: 4.6 mmol/L (ref 3.5–5.1)
Sodium: 138 mmol/L (ref 135–145)

## 2023-09-11 MED ORDER — FUROSEMIDE 10 MG/ML IJ SOLN
40.0000 mg | Freq: Every day | INTRAMUSCULAR | Status: DC
  Administered 2023-09-11 – 2023-09-12 (×2): 40 mg via INTRAVENOUS
  Filled 2023-09-11 (×2): qty 4

## 2023-09-11 NOTE — Plan of Care (Signed)
  Problem: Education: Goal: Ability to describe self-care measures that may prevent or decrease complications (Diabetes Survival Skills Education) will improve Outcome: Progressing   Problem: Coping: Goal: Ability to adjust to condition or change in health will improve Outcome: Progressing   Problem: Fluid Volume: Goal: Ability to maintain a balanced intake and output will improve Outcome: Progressing   Problem: Metabolic: Goal: Ability to maintain appropriate glucose levels will improve Outcome: Progressing   Problem: Tissue Perfusion: Goal: Adequacy of tissue perfusion will improve Outcome: Progressing   Problem: Education: Goal: Knowledge of General Education information will improve Description: Including pain rating scale, medication(s)/side effects and non-pharmacologic comfort measures Outcome: Progressing   Problem: Nutrition: Goal: Adequate nutrition will be maintained Outcome: Progressing

## 2023-09-11 NOTE — Progress Notes (Signed)
 PROGRESS NOTE  [name]  FMW:[id number] DOB: [birth year] DOA: 09/07/2023 PCP: Ileen [name], NP  Consultants  Brief Narrative: 89 y.o. female with a PMH significant for chronically ill, wheelchair-bound with history of CVA, diabetes, dislocated right shoulder, CKD 4, CABG, PAD, gout, rheumatoid arthritis presented to the ED with shortness of breath and swelling. - Recently hospitalized 6/19-25 with encephalopathy which was felt to be multifactorial related to polypharmacy UTI etc, subsequently after discharge home noticed worsening edema in lower legs along with progressive dyspnea on exertion. - In the ED hypertensive, labs noted creatinine of 2.3, hemoglobin 8.4, troponin 80, BNP 803, chest x-ray concerning for CHF and basilar collapse/consolidation likely reflecting atelectasis -Admitted, started on diuretics  Assessment & Plan: Acute CHF, likely diastolic/pulmonary hypertension -Echo last year noted preserved EF, grade 1 DD, normal RV -Repeat echo showed EF of 60 to 65%, left ventricular diastolic parameters are indeterminate -She has hypoalbuminemia as well which is likely contributing to third spacing - Lasix  held yesterday 7/24 in light of hypotension.  Blood pressure creeping back upwards today and restarted at 40 mg IV. -If continues to diurese plan will be to switch to oral torsemide tomorrow -GDMT limited by CKD 4 -Conservative management on account of advanced age frailty, poor functional status - PT OT eval previously recommended but I do not see that this was actually ordered.  Will order today   CKD 4 -Creatinine in the 2.3-2.6 range of baseline, relatively stable, monitor with diuresis, avoid hypotension - Remains stable   Dysphagia/aspiration -Patient complains of cough after eating - Swallow eval negative. - Possibility of GERD, although she is on Protonix .  No actual choking episodes, no globus sensation   Acute on chronic anemia -Likely from  CKD and hemodilution - Anemia panel with severe iron  deficiency,  - Status post IV iron , 1 dose   Hypertension - Continue Coreg , diuretics as above   Hyperlipidemia - Continue rosuvastatin    Diabetes - CBGs are controlled, A1c is 5.2   PAD CAD > Status post CABG - Continue home Coreg , rosuvastatin , Plavix    History of CVA - Continue rosuvastatin , Plavix    Rheumatoid arthritis - Continue home Plaquenil    Chronic right shoulder dislocation -Noted  DVT prophylaxis:  heparin  injection 5,000 Units Start: 09/07/23 1600  Code Status:   Code Status: Do not attempt resuscitation (DNR) PRE-ARREST INTERVENTIONS DESIRED Level of care: Telemetry Cardiac Status is: Inpatient Dispo: Moving towards discharge   Subjective: Patient awake and alert today.  Reports breathing is close to her baseline.  She has not been out of bed, eating and drinking well.  Still complains of coughing after eating  Objective: Vitals:   09/11/23 0452 09/11/23 0709 09/11/23 1129 09/11/23 1603  BP: (!) 139/52 (!) 150/48 (!) 139/56 129/79  Pulse: 69 76 67 68  Resp: 20 16 16 16   Temp: 99.3 F (37.4 C) 99 F (37.2 C) 99.3 F (37.4 C) 99.3 F (37.4 C)  TempSrc: Axillary Oral Oral Oral  SpO2: 99% 98% 99% 98%  Weight: 59.5 kg     Height:        Intake/Output Summary (Last 24 hours) at 09/11/2023 1604 Last data filed at 09/11/2023 1132 Gross per 24 hour  Intake 358 ml  Output 975 ml  Net -617 ml   Filed Weights   09/09/23 0636 09/10/23 0522 09/11/23 0452  Weight: 61.5 kg 61.2 kg 59.5 kg   Body mass index is 22.5 kg/m.  Gen: 89 y.o. female in no apparent distress.  Nontoxic, frail appearing, slumped over in bed Pulm: Non-labored breathing.  Clear to auscultation bilaterally.  CV: Regular rate and rhythm.  GI: Abdomen soft, non-tender, non-distended Ext: Warm, no deformities,  Skin: No rashes, lesions  Neuro: Alert and oriented to person and place. No focal neurological deficits. Psych: Calm   Judgement and insight appear normal. Mood & affect appropriate.     I have personally reviewed the following labs and images: CBC: Recent Labs  Lab 09/07/23 1351 09/08/23 0208 09/09/23 0300 09/10/23 0249  WBC 3.6* 3.4* 3.1* 4.3  HGB 8.4* 7.8* 7.4* 7.6*  HCT 26.4* 23.7* 22.6* 23.4*  MCV 98.5 93.3 94.6 95.1  PLT 218 212 207 211   BMP &GFR Recent Labs  Lab 09/07/23 1351 09/07/23 1659 09/08/23 0208 09/09/23 0300 09/10/23 0249 09/11/23 0251  NA 133*  --  132* 136 135 138  K 4.9  --  4.6 4.7 4.7 4.6  CL 107  --  106 107 104 105  CO2 15*  --  17* 18* 20* 21*  GLUCOSE 92  --  89 85 82 79  BUN 68*  --  74* 76* 76* 83*  CREATININE 2.36*  --  2.46* 2.35* 2.59* 2.53*  CALCIUM  9.0  --  8.8* 8.8* 8.9 8.9  MG  --  2.0  --   --   --   --    Estimated Creatinine Clearance: 11.7 mL/min (A) (by C-G formula based on SCr of 2.53 mg/dL (H)). Liver & Pancreas: Recent Labs  Lab 09/08/23 0208  AST 17  ALT 13  ALKPHOS 71  BILITOT 0.7  PROT 5.7*  ALBUMIN 2.8*   No results for input(s): LIPASE, AMYLASE in the last 168 hours. No results for input(s): AMMONIA in the last 168 hours. Diabetic: No results for input(s): HGBA1C in the last 72 hours. Recent Labs  Lab 09/10/23 1124 09/10/23 1606 09/10/23 2130 09/11/23 0622 09/11/23 1130  GLUCAP 165* 108* 132* 73 139*   Cardiac Enzymes: No results for input(s): CKTOTAL, CKMB, CKMBINDEX, TROPONINI in the last 168 hours. No results for input(s): PROBNP in the last 8760 hours. Coagulation Profile: No results for input(s): INR, PROTIME in the last 168 hours. Thyroid  Function Tests: No results for input(s): TSH, T4TOTAL, FREET4, T3FREE, THYROIDAB in the last 72 hours. Lipid Profile: No results for input(s): CHOL, HDL, LDLCALC, TRIG, CHOLHDL, LDLDIRECT in the last 72 hours. Anemia Panel: No results for input(s): VITAMINB12, FOLATE, FERRITIN, TIBC, IRON , RETICCTPCT in the last 72  hours. Urine analysis:    Component Value Date/Time   COLORURINE YELLOW 08/07/2023 1559   APPEARANCEUR CLOUDY (A) 08/07/2023 1559   LABSPEC 1.012 08/07/2023 1559   PHURINE 5.0 08/07/2023 1559   GLUCOSEU 150 (A) 08/07/2023 1559   HGBUR NEGATIVE 08/07/2023 1559   BILIRUBINUR NEGATIVE 08/07/2023 1559   BILIRUBINUR negative 11/14/2020 1608   KETONESUR NEGATIVE 08/07/2023 1559   PROTEINUR 30 (A) 08/07/2023 1559   UROBILINOGEN 0.2 11/14/2020 1608   UROBILINOGEN 0.2 04/26/2009 0014   NITRITE NEGATIVE 08/07/2023 1559   LEUKOCYTESUR LARGE (A) 08/07/2023 1559   Sepsis Labs: Invalid input(s): PROCALCITONIN, LACTICIDVEN  Microbiology: Recent Results (from the past 240 hours)  Resp panel by RT-PCR (RSV, Flu A&B, Covid) Anterior Nasal Swab     Status: None   Collection Time: 09/07/23  4:05 PM   Specimen: Anterior Nasal Swab  Result Value Ref Range Status   SARS Coronavirus 2 by RT PCR NEGATIVE NEGATIVE Final   Influenza A by PCR NEGATIVE NEGATIVE Final  Influenza B by PCR NEGATIVE NEGATIVE Final    Comment: (NOTE) The Xpert Xpress SARS-CoV-2/FLU/RSV plus assay is intended as an aid in the diagnosis of influenza from Nasopharyngeal swab specimens and should not be used as a sole basis for treatment. Nasal washings and aspirates are unacceptable for Xpert Xpress SARS-CoV-2/FLU/RSV testing.  Fact Sheet for Patients: BloggerCourse.com  Fact Sheet for Healthcare Providers: SeriousBroker.it  This test is not yet approved or cleared by the [place]  FDA and has been authorized for detection and/or diagnosis of SARS-CoV-2 by FDA under an Emergency Use Authorization (EUA). This EUA will remain in effect (meaning this test can be used) for the duration of the COVID-19 declaration under Section 564(b)(1) of the Act, 21 U.S.C. section 360bbb-3(b)(1), unless the authorization is terminated or revoked.     Resp Syncytial Virus by PCR  NEGATIVE NEGATIVE Final    Comment: (NOTE) Fact Sheet for Patients: BloggerCourse.com  Fact Sheet for Healthcare Providers: SeriousBroker.it  This test is not yet approved or cleared by the [place]  FDA and has been authorized for detection and/or diagnosis of SARS-CoV-2 by FDA under an Emergency Use Authorization (EUA). This EUA will remain in effect (meaning this test can be used) for the duration of the COVID-19 declaration under Section 564(b)(1) of the Act, 21 U.S.C. section 360bbb-3(b)(1), unless the authorization is terminated or revoked.  Performed at [facility], [address] [place], [place] [address]     Radiology Studies: No results found.  Scheduled Meds:  carvedilol   6.25 mg Oral BID WC   clopidogrel   75 mg Oral Daily   furosemide   40 mg Intravenous Daily   heparin   5,000 Units Subcutaneous Q8H   hydroxychloroquine   200 mg Oral Daily   insulin  aspart  0-9 Units Subcutaneous TID WC   melatonin  3 mg Oral QHS   pantoprazole   40 mg Oral Daily   rosuvastatin   10 mg Oral QPM   sodium chloride  flush  3 mL Intravenous Q12H   Continuous Infusions:   LOS: 3 days   35 minutes with more than 50% spent in reviewing records, counseling patient/family and coordinating care.  Reyes VEAR Gaw, MD [facility] [email or web address] 09/11/2023, 4:04 PM

## 2023-09-12 DIAGNOSIS — N184 Chronic kidney disease, stage 4 (severe): Secondary | ICD-10-CM | POA: Diagnosis not present

## 2023-09-12 DIAGNOSIS — I5031 Acute diastolic (congestive) heart failure: Secondary | ICD-10-CM | POA: Diagnosis not present

## 2023-09-12 LAB — CBC
HCT: 22.1 % — ABNORMAL LOW (ref 36.0–46.0)
Hemoglobin: 7.1 g/dL — ABNORMAL LOW (ref 12.0–15.0)
MCH: 30.7 pg (ref 26.0–34.0)
MCHC: 32.1 g/dL (ref 30.0–36.0)
MCV: 95.7 fL (ref 80.0–100.0)
Platelets: 200 K/uL (ref 150–400)
RBC: 2.31 MIL/uL — ABNORMAL LOW (ref 3.87–5.11)
RDW: 15.7 % — ABNORMAL HIGH (ref 11.5–15.5)
WBC: 5.1 K/uL (ref 4.0–10.5)
nRBC: 0 % (ref 0.0–0.2)

## 2023-09-12 LAB — BASIC METABOLIC PANEL WITH GFR
Anion gap: 9 (ref 5–15)
BUN: 88 mg/dL — ABNORMAL HIGH (ref 8–23)
CO2: 22 mmol/L (ref 22–32)
Calcium: 8.8 mg/dL — ABNORMAL LOW (ref 8.9–10.3)
Chloride: 106 mmol/L (ref 98–111)
Creatinine, Ser: 2.88 mg/dL — ABNORMAL HIGH (ref 0.44–1.00)
GFR, Estimated: 15 mL/min — ABNORMAL LOW (ref >60)
Glucose, Bld: 77 mg/dL (ref 70–99)
Potassium: 4.7 mmol/L (ref 3.5–5.1)
Sodium: 137 mmol/L (ref 135–145)

## 2023-09-12 LAB — GLUCOSE, CAPILLARY
Glucose-Capillary: 74 mg/dL (ref 70–99)
Glucose-Capillary: 149 mg/dL — ABNORMAL HIGH (ref 70–99)
Glucose-Capillary: 133 mg/dL — ABNORMAL HIGH (ref 70–99)
Glucose-Capillary: 145 mg/dL — ABNORMAL HIGH (ref 70–99)

## 2023-09-12 MED ORDER — FUROSEMIDE 40 MG PO TABS
40.0000 mg | ORAL_TABLET | Freq: Every day | ORAL | Status: DC
  Administered 2023-09-13 – 2023-09-18 (×6): 40 mg via ORAL
  Filled 2023-09-12 (×6): qty 1

## 2023-09-12 MED ORDER — AMLODIPINE BESYLATE 10 MG PO TABS
10.0000 mg | ORAL_TABLET | Freq: Every day | ORAL | Status: DC
  Administered 2023-09-13 – 2023-09-18 (×6): 10 mg via ORAL
  Filled 2023-09-12 (×7): qty 1

## 2023-09-12 NOTE — Evaluation (Signed)
 Occupational Therapy Evaluation Patient Details Name: [name] MRN: [id number] DOB: 1927-02-19 Today's Date: 09/12/2023   History of Present Illness   Pt is a 89 y/o female admitted, 7/21 with 1 week period of worsening dyspnea on exertion and edema.  Past medical history significant for CVA in 2003, 12/24, CAD status post CABG, hyperlipidemia, hypertension, CKD stage IV, lumbar radiculopathy, diabetes mellitus type 2 non-insulin -dependent and closed wedge compression fracture of L1 vertebra with routine healing, RA     Clinical Impressions Pt currently at max assist to max +2 for simulated selfcare tasks sit to stand.  She exhibits bilateral shoulder limitations with increased edema noted in the LUE and decreased AROM in digits.  She was unable to complete full upright standing with two attempts and remains in flexed posture with max assist for static standing.  Prior to admission she had some assist from caregiver for 3 days a week for 3 hours and her son would check on her.  She reports having difficulty and just trying to get by best she could.  Recommend acute care OT at this time to help increase strength, balance, and help decrease LUE edema and increase functional use.  Feel pt will benefit from post acute rehab <3 hours/day to reach safe level for home with her son.        If plan is discharge home, recommend the following:   A lot of help with bathing/dressing/bathroom;A lot of help with walking and/or transfers;Assistance with cooking/housework;Assist for transportation;Help with stairs or ramp for entrance;Supervision due to cognitive status     Functional Status Assessment   Patient has had a recent decline in their functional status and demonstrates the ability to make significant improvements in function in a reasonable and predictable amount of time.     Equipment Recommendations   None recommended by OT      Precautions/Restrictions    Precautions Precautions: Fall Recall of Precautions/Restrictions: Intact Restrictions Weight Bearing Restrictions Per Provider Order: No     Mobility Bed Mobility               General bed mobility comments: Pt up in recliner to start session    Transfers Overall transfer level: Needs assistance   Transfers: Sit to/from Stand Sit to Stand: Max assist           General transfer comment: Pt unable to achieve full upright standing for stepping or mobility.      Balance Overall balance assessment: Needs assistance Sitting-balance support: No upper extremity supported, Single extremity supported Sitting balance-[name]: Fair     Standing balance support: Bilateral upper extremity supported, During functional activity, Reliant on assistive device for balance Standing balance-[name]: Zero Standing balance comment: She needs max assist from therapist to maintain upright standing.                           ADL either performed or assessed with clinical judgement   ADL Overall ADL's : Needs assistance/impaired Eating/Feeding: Supervision/ safety;Sitting   Grooming: Wash/dry face;Wash/dry hands;Set up;Sitting Grooming Details (indicate cue type and reason): supported sitting in recliner Upper Body Bathing: Minimal assistance;Sitting   Lower Body Bathing: Moderate assistance;+2 for physical assistance;Sit to/from stand   Upper Body Dressing : Sitting;Moderate assistance   Lower Body Dressing: +2 for physical assistance;Sit to/from stand;Maximal assistance   Toilet Transfer: Maximal assistance;+2 for physical assistance;Stand-pivot Toilet Transfer Details (indicate cue type and reason): simulated Toileting- Clothing Manipulation and  Hygiene: Maximal assistance;Sit to/from stand;+2 for physical assistance       Functional mobility during ADLs: +2 for physical assistance;Maximal assistance General ADL Comments: Pt with increased edema noted in the  left arm from digits proximal to upper arm.  Completed retrograde massage and educated pt on AROM/AAROM for the hand, wrist, and elbow to help with decreasing fluid.  Also, provided instruction on positioning in bed and in recliner to have hand positioned above her elbow.  Pt with decreased ability to complete reciprical scooting to the EOC., needed max assist and then she was unable to achieve full upright standing from recliner.     Vision Baseline Vision/History: 0 No visual deficits Ability to See in Adequate Light: 0 Adequate Patient Visual Report: No change from baseline Vision Assessment?: No apparent visual deficits     Perception Perception: Not tested       Praxis Praxis: Not tested       Pertinent Vitals/Pain Pain Assessment Pain Assessment: Faces Faces Pain Scale: No hurt     Extremity/Trunk Assessment Upper Extremity Assessment Upper Extremity Assessment: RUE deficits/detail;LUE deficits/detail RUE Deficits / Details: AROM shoulder flexion less than 80 degrees,  AAROM 0-90 with end range feel and slight pain.  Elbow flexion AROM 75% of normal.  Noted increased edema throughout the LUE.  Decreased AROM digit flexion and extension.  Approximately 50% of normal gross digit flexion with 75% gross extension RUE Sensation: WNL RUE Coordination: decreased fine motor;decreased gross motor LUE Deficits / Details: Shoulder flexion AROM less than 80 degrees.  All other joints AROM WFLS LUE Coordination: decreased gross motor;decreased fine motor   Lower Extremity Assessment Lower Extremity Assessment: Defer to PT evaluation   Cervical / Trunk Assessment Cervical / Trunk Assessment: Kyphotic   Communication Communication Communication: No apparent difficulties   Cognition Arousal: Alert Behavior During Therapy: WFL for tasks assessed/performed Cognition: No family/caregiver present to determine baseline                               Following commands:  Intact                  Home Living Family/patient expects to be discharged to:: Private residence Living Arrangements: Children (Will be going to son's home) Available Help at Discharge: Family;Available PRN/intermittently;Personal care attendant Type of Home: House Home Access: Level entry;Ramped entrance     Home Layout: Two level;Able to live on main level with bedroom/bathroom Alternate Level Stairs-Number of Steps: does not go to second level   Bathroom Shower/Tub: Producer, television/film/video: Handicapped height Bathroom Accessibility: Yes   Home Equipment: Cane - single point;Wheelchair - Stage manager (4 wheels)   Additional Comments: meals on wheels 1x/day; cargeiver ~3 days/wk for 3 hrs. son checks in, does not physically assist      Prior Functioning/Environment Prior Level of Function : Needs assist             Mobility Comments: She reported being non-ambulatory for ~1 year. She has been using a manual wheelchair for mobility inside her home. She reported performing a squat-pivot transfer into and out of the wheelchair.  She has been to SNF rehab several times. ADLs Comments: The pt reported having a lot of difficulty managing bathing & dressing tasks. She stated, I do the best I can. She takes spongebaths, as she's unable to transfer into & out of her shower. She gets 1 meal  per day with meals on wheels & her son also helps with meals.    OT Problem List: Decreased strength;Decreased range of motion;Impaired balance (sitting and/or standing);Decreased knowledge of use of DME or AE;Pain;Impaired UE functional use   OT Treatment/Interventions: Self-care/ADL training;Patient/family education;Therapeutic exercise;Balance training;Therapeutic activities;DME and/or AE instruction      OT Goals(Current goals can be found in the care plan section)   Acute Rehab OT Goals Patient Stated Goal: Pt did not state but  reports that she is going to stay with her son. OT Goal Formulation: With patient Time For Goal Achievement: 09/26/23 Potential to Achieve Goals: Fair   OT Frequency:  Min 1X/week       AM-PAC OT 6 Clicks Daily Activity     Outcome Measure Help from another person eating meals?: A Little Help from another person taking care of personal grooming?: A Little Help from another person toileting, which includes using toliet, bedpan, or urinal?: Total Help from another person bathing (including washing, rinsing, drying)?: Total Help from another person to put on and taking off regular upper body clothing?: A Lot Help from another person to put on and taking off regular lower body clothing?: Total 6 Click Score: 11   End of Session Nurse Communication: Mobility status  Activity Tolerance: Patient tolerated treatment well Patient left: in chair;with call bell/phone within reach  OT Visit Diagnosis: Unsteadiness on feet (R26.81);Other abnormalities of gait and mobility (R26.89);Muscle weakness (generalized) (M62.81);Pain Pain - Right/Left: Left Pain - part of body: Shoulder                Time: 8575-8497 OT Time Calculation (min): 38 min Charges:  OT General Charges $OT Visit: 1 Visit OT Evaluation $OT Eval Moderate Complexity: 1 Mod OT Treatments $Self Care/Home Management : 23-37 mins  Lynwood Constant, OTR/L Acute Rehabilitation Services  Office [phone] 09/12/2023

## 2023-09-12 NOTE — Plan of Care (Addendum)
 Pt will need substantial physical therapy. Pt states on Adx (7/21) could still walk, pt can no longer stand by herself. Two person assist to move.  Pt has generalized L-sided weakness (neuro) from CVA sustained.

## 2023-09-12 NOTE — Progress Notes (Addendum)
 PROGRESS NOTE  [name]  FMW:[id number] DOB: Jun 22, 1927 DOA: 09/07/2023 PCP: Ileen [name], NP  Consultants  Brief Narrative: 89 y.o. female with a PMH significant for chronically ill, wheelchair-bound with history of CVA, diabetes, dislocated right shoulder, CKD 4, CABG, PAD, gout, rheumatoid arthritis presented to the ED with shortness of breath and swelling.  Of note, pt had recent hospitalization 6/19-25 with encephalopathy which was felt to be multifactorial related to polypharmacy UTI etc.  Subsequently after discharge home noticed worsening edema in lower legs along with progressive dyspnea on exertion which eventually led to, this ER presentation and admission.  In ED BNP 803 and chest x-ray concerning for CHF and basilar collapse/atelectasis.  [facility] hospital[facility] called for admission and started on diuretics for treatment for acute CHF exacerbation.  Assessment & Plan: Acute CHF, likely diastolic/pulmonary hypertension -Echo last year noted preserved EF, grade 1 DD, normal RV -Repeat echo showed EF of 60 to 65%, left ventricular diastolic parameters are indeterminate -She has hypoalbuminemia as well which is likely contributing to third spacing - Lasix  held yesterday 7/24 in light of hypotension.  Blood pressure creeping back upwards today and restarted at 40 mg IV. -She is down about 5 L since admission.  Plan will be to start oral torsemide today. -GDMT limited by CKD 4 -Conservative management on account of advanced age frailty, poor functional status - PT evaluated and recommended patient for SNF.  Will need to discuss with family.   CKD 4 -Creatinine in the 2.3-2.6 range of baseline, relatively stable, monitor with diuresis, avoid hypotension - Slight bump today to 2.8.  Stopping IV diuretics and starting oral torsemide.   Dysphagia/aspiration -Patient complains of cough after eating - Swallow eval negative. - Possibility of GERD, although she is on  Protonix .  No actual choking episodes, no globus sensation, just reports cough 2 to 3 minutes after eating. - Will obtain modified barium swallow to further evaluate.   Acute on chronic anemia - Multifactorial, CKD plus severe iron  deficiency. -She is status post 1 dose of IV iron . - She has had slow decrease in hemoglobin since admission.  Will continue to monitor and transfuse for hemoglobin less than 7 -She is asymptomatic    Hypertension - Continue Coreg , diuretics as above   Hyperlipidemia - Continue rosuvastatin    Diabetes - CBGs are controlled, A1c is 5.2   PAD CAD > Status post CABG - Continue home Coreg , rosuvastatin , Plavix    History of CVA - Continue rosuvastatin , Plavix    Rheumatoid arthritis - Continue home Plaquenil    Chronic right shoulder dislocation -Noted  DVT prophylaxis:  heparin  injection 5,000 Units Start: 09/07/23 1600 Family Communication:  tried son at both house and mobile phone and had to leave voicemail Code Status:   Code Status: Do not attempt resuscitation (DNR) PRE-ARREST INTERVENTIONS DESIRED Level of care: Telemetry Cardiac Status is: Inpatient   Subjective: Patient awake and alert today.  Reports breathing is close to her baseline. Still complains of coughing after eating.  Hasn't been out of bed yet.   Objective: Vitals:   09/12/23 0421 09/12/23 0745 09/12/23 0800 09/12/23 1143  BP: (!) 165/51 (!) 162/54  (!) 170/54  Pulse: 72 71    Resp: 18     Temp: 98.1 F (36.7 C) 98.5 F (36.9 C)  98.3 F (36.8 C)  TempSrc: Oral Axillary  Oral  SpO2: 95% 97% 94%   Weight: 61.3 kg     Height:        Intake/Output  Summary (Last 24 hours) at 09/12/2023 1532 Last data filed at 09/12/2023 0907 Gross per 24 hour  Intake 240 ml  Output 410 ml  Net -170 ml   Filed Weights   09/10/23 0522 09/11/23 0452 09/12/23 0421  Weight: 61.2 kg 59.5 kg 61.3 kg   Body mass index is 23.19 kg/m.  Gen: 89 y.o. female in no apparent distress.   Nontoxic, frail appearing, slumped over in bed Pulm: Non-labored breathing.  Clear to auscultation bilaterally.  CV: Regular rate and rhythm.  GI: Abdomen soft, non-tender, non-distended Ext: Warm, no deformities,  Skin: No rashes, lesions  Neuro: Alert and oriented to person and place. No focal neurological deficits. Psych: Calm  Judgement and insight appear normal. Mood & affect appropriate.     I have personally reviewed the following labs and images: CBC: Recent Labs  Lab 09/07/23 1351 09/08/23 0208 09/09/23 0300 09/10/23 0249 09/12/23 0359  WBC 3.6* 3.4* 3.1* 4.3 5.1  HGB 8.4* 7.8* 7.4* 7.6* 7.1*  HCT 26.4* 23.7* 22.6* 23.4* 22.1*  MCV 98.5 93.3 94.6 95.1 95.7  PLT 218 212 207 211 200   BMP &GFR Recent Labs  Lab 09/07/23 1659 09/08/23 0208 09/09/23 0300 09/10/23 0249 09/11/23 0251 09/12/23 0359  NA  --  132* 136 135 138 137  K  --  4.6 4.7 4.7 4.6 4.7  CL  --  106 107 104 105 106  CO2  --  17* 18* 20* 21* 22  GLUCOSE  --  89 85 82 79 77  BUN  --  74* 76* 76* 83* 88*  CREATININE  --  2.46* 2.35* 2.59* 2.53* 2.88*  CALCIUM   --  8.8* 8.8* 8.9 8.9 8.8*  MG 2.0  --   --   --   --   --    Estimated Creatinine Clearance: 10.3 mL/min (A) (by C-G formula based on SCr of 2.88 mg/dL (H)). Liver & Pancreas: Recent Labs  Lab 09/08/23 0208  AST 17  ALT 13  ALKPHOS 71  BILITOT 0.7  PROT 5.7*  ALBUMIN 2.8*   No results for input(s): LIPASE, AMYLASE in the last 168 hours. No results for input(s): AMMONIA in the last 168 hours. Diabetic: No results for input(s): HGBA1C in the last 72 hours. Recent Labs  Lab 09/11/23 1130 09/11/23 1604 09/11/23 2056 09/12/23 0617 09/12/23 1029  GLUCAP 139* 80 109* 74 149*   Cardiac Enzymes: No results for input(s): CKTOTAL, CKMB, CKMBINDEX, TROPONINI in the last 168 hours. No results for input(s): PROBNP in the last 8760 hours. Coagulation Profile: No results for input(s): INR, PROTIME in the last 168  hours. Thyroid  Function Tests: No results for input(s): TSH, T4TOTAL, FREET4, T3FREE, THYROIDAB in the last 72 hours. Lipid Profile: No results for input(s): CHOL, HDL, LDLCALC, TRIG, CHOLHDL, LDLDIRECT in the last 72 hours. Anemia Panel: No results for input(s): VITAMINB12, FOLATE, FERRITIN, TIBC, IRON , RETICCTPCT in the last 72 hours. Urine analysis:    Component Value Date/Time   COLORURINE YELLOW 08/07/2023 1559   APPEARANCEUR CLOUDY (A) 08/07/2023 1559   LABSPEC 1.012 08/07/2023 1559   PHURINE 5.0 08/07/2023 1559   GLUCOSEU 150 (A) 08/07/2023 1559   HGBUR NEGATIVE 08/07/2023 1559   BILIRUBINUR NEGATIVE 08/07/2023 1559   BILIRUBINUR negative 11/14/2020 1608   KETONESUR NEGATIVE 08/07/2023 1559   PROTEINUR 30 (A) 08/07/2023 1559   UROBILINOGEN 0.2 11/14/2020 1608   UROBILINOGEN 0.2 04/26/2009 0014   NITRITE NEGATIVE 08/07/2023 1559   LEUKOCYTESUR LARGE (A) 08/07/2023 1559  Sepsis Labs: Invalid input(s): PROCALCITONIN, LACTICIDVEN  Microbiology: Recent Results (from the past 240 hours)  Resp panel by RT-PCR (RSV, Flu A&B, Covid) Anterior Nasal Swab     Status: None   Collection Time: 09/07/23  4:05 PM   Specimen: Anterior Nasal Swab  Result Value Ref Range Status   SARS Coronavirus 2 by RT PCR NEGATIVE NEGATIVE Final   Influenza A by PCR NEGATIVE NEGATIVE Final   Influenza B by PCR NEGATIVE NEGATIVE Final    Comment: (NOTE) The Xpert Xpress SARS-CoV-2/FLU/RSV plus assay is intended as an aid in the diagnosis of influenza from Nasopharyngeal swab specimens and should not be used as a sole basis for treatment. Nasal washings and aspirates are unacceptable for Xpert Xpress SARS-CoV-2/FLU/RSV testing.  Fact Sheet for Patients: BloggerCourse.com  Fact Sheet for Healthcare Providers: SeriousBroker.it  This test is not yet approved or cleared by the [place]  FDA and has been  authorized for detection and/or diagnosis of SARS-CoV-2 by FDA under an Emergency Use Authorization (EUA). This EUA will remain in effect (meaning this test can be used) for the duration of the COVID-19 declaration under Section 564(b)(1) of the Act, 21 U.S.C. section 360bbb-3(b)(1), unless the authorization is terminated or revoked.     Resp Syncytial Virus by PCR NEGATIVE NEGATIVE Final    Comment: (NOTE) Fact Sheet for Patients: BloggerCourse.com  Fact Sheet for Healthcare Providers: SeriousBroker.it  This test is not yet approved or cleared by the [place]  FDA and has been authorized for detection and/or diagnosis of SARS-CoV-2 by FDA under an Emergency Use Authorization (EUA). This EUA will remain in effect (meaning this test can be used) for the duration of the COVID-19 declaration under Section 564(b)(1) of the Act, 21 U.S.C. section 360bbb-3(b)(1), unless the authorization is terminated or revoked.  Performed at [facility], [address] [place], [place] [address]     Radiology Studies: No results found.  Scheduled Meds:  carvedilol   6.25 mg Oral BID WC   clopidogrel   75 mg Oral Daily   furosemide   40 mg Intravenous Daily   heparin   5,000 Units Subcutaneous Q8H   hydroxychloroquine   200 mg Oral Daily   insulin  aspart  0-9 Units Subcutaneous TID WC   melatonin  3 mg Oral QHS   pantoprazole   40 mg Oral Daily   rosuvastatin   10 mg Oral QPM   sodium chloride  flush  3 mL Intravenous Q12H   Continuous Infusions:   LOS: 4 days   35 minutes with more than 50% spent in reviewing records, counseling patient/family and coordinating care.  Reyes VEAR Gaw, MD [facility] [email or web address] 09/12/2023, 3:32 PM

## 2023-09-12 NOTE — Evaluation (Signed)
 Physical Therapy Evaluation Patient Details Name: [name] MRN: [id number] DOB: 12-Aug-1927 Today's Date: 09/12/2023  History of Present Illness  Pt is a 89 y/o female admitted, 7/21 with 1 week period of worsening dyspnea on exertion and edema.  Past medical history significant for CVA in 2003, 12/24, CAD status post CABG, hyperlipidemia, hypertension, CKD stage IV, lumbar radiculopathy, diabetes mellitus type 2 non-insulin -dependent and closed wedge compression fracture of L1 vertebra with routine healing, RA  Clinical Impression  Pt admitted with/for worsening dyspnea and edema.  Presently, this patient needs maximal assist for basic mobility and squat pivot transfers.  Pt currently limited functionally due to the problems listed. ( See problems list.)   Pt will benefit from PT to maximize function and safety in order to get ready for next venue listed below.  Patient will benefit from continued inpatient follow up therapy, <3 hours/day, but she may push to go straight home to her son's home.  TBD early next week.           If plan is discharge home, recommend the following: A lot of help with walking and/or transfers;A lot of help with bathing/dressing/bathroom;Assistance with cooking/housework;Assist for transportation   Can travel by private vehicle   No    Equipment Recommendations None recommended by PT  Recommendations for Other Services       Functional Status Assessment Patient has had a recent decline in their functional status and demonstrates the ability to make significant improvements in function in a reasonable and predictable amount of time.     Precautions / Restrictions Precautions Precautions: Fall Recall of Precautions/Restrictions: Intact      Mobility  Bed Mobility Overal bed mobility: Needs Assistance Bed Mobility: Supine to Sit           General bed mobility comments: HOB slightly raised.  Attempted assist of roll L over onto L UE  with max assist including legs off the bed.  then mod to complete coming upright with bil UE assist.  mod assist for laborous scoot to EOB    Transfers Overall transfer level: Needs assistance   Transfers: Bed to chair/wheelchair/BSC, Sit to/from Stand Sit to Stand: Max assist     Squat pivot transfers: Max assist     General transfer comment: face to face assist for squat pivot to the recliner    Ambulation/Gait               General Gait Details: pt non ambulatory  Stairs            Wheelchair Mobility     Tilt Bed    Modified Rankin (Stroke Patients Only)       Balance Overall balance assessment: Needs assistance Sitting-balance support: No upper extremity supported, Single extremity supported Sitting balance-[name]: Fair     Standing balance support: Bilateral upper extremity supported, During functional activity, Reliant on assistive device for balance Standing balance-[name]: Zero                               Pertinent Vitals/Pain Pain Assessment Pain Assessment: Faces Faces Pain Scale: No hurt Pain Intervention(s): Monitored during session    Home Living Family/patient expects to be discharged to:: Private residence Living Arrangements: Children (Will be going to son's home) Available Help at Discharge: Family;Available PRN/intermittently;Personal care attendant Type of Home: House Home Access: Level entry;Ramped entrance     Alternate Level Stairs-Number of Steps: does  not go to second level Home Layout: Two level;Able to live on main level with bedroom/bathroom Home Equipment: [place] - single point;Wheelchair - Stage manager (4 wheels) Additional Comments: meals on wheels 1x/day; cargeiver ~3 days/wk for 3 hrs. son checks in, does not physically assist    Prior Function Prior Level of Function : Needs assist             Mobility Comments: She reported being non-ambulatory  for ~1 year. She has been using a manual wheelchair for mobility inside her home. She reported performing a squat-pivot transfer into and out of the wheelchair.  She has been to SNF rehab several times. ADLs Comments: The pt reported having a lot of difficulty managing bathing & dressing tasks. She stated, I do the best I can. She takes spongebaths, as she's unable to transfer into & out of her shower. She gets 1 meal per day with meals on wheels & her son also helps with meals.     Extremity/Trunk Assessment   Upper Extremity Assessment Upper Extremity Assessment: Defer to OT evaluation    Lower Extremity Assessment Lower Extremity Assessment: Generalized weakness    Cervical / Trunk Assessment Cervical / Trunk Assessment: Kyphotic (weakness, difficulty building momentum)  Communication   Communication Communication: No apparent difficulties    Cognition Arousal: Alert Behavior During Therapy: WFL for tasks assessed/performed   PT - Cognitive impairments: No apparent impairments                         Following commands: Intact       Cueing Cueing Techniques: Verbal cues     General Comments General comments (skin integrity, edema, etc.): VSS on RA  97% and HR 65 bpm    Exercises     Assessment/Plan    PT Assessment Patient needs continued PT services  PT Problem List Decreased strength;Decreased activity tolerance;Decreased balance;Decreased mobility;Decreased coordination;Cardiopulmonary status limiting activity;Pain       PT Treatment Interventions Functional mobility training;Therapeutic activities;Therapeutic exercise;Balance training;Patient/family education    PT Goals (Current goals can be found in the Care Plan section)  Acute Rehab PT Goals Patient Stated Goal: able to do well at my son's home. PT Goal Formulation: With patient Time For Goal Achievement: 09/26/23 Potential to Achieve Goals: Good    Frequency Min 2X/week      Co-evaluation               AM-PAC PT 6 Clicks Mobility  Outcome Measure Help needed turning from your back to your side while in a flat bed without using bedrails?: A Lot Help needed moving from lying on your back to sitting on the side of a flat bed without using bedrails?: A Lot Help needed moving to and from a bed to a chair (including a wheelchair)?: A Lot Help needed standing up from a chair using your arms (e.g., wheelchair or bedside chair)?: A Lot Help needed to walk in hospital room?: Total Help needed climbing 3-5 steps with a railing? : Total 6 Click Score: 10    End of Session   Activity Tolerance: Patient tolerated treatment well Patient left: in chair;with call bell/phone within reach;Other (comment) (lunch set up) Nurse Communication: Mobility status PT Visit Diagnosis: Muscle weakness (generalized) (M62.81);Other abnormalities of gait and mobility (R26.89)    Time: 8772-8746 PT Time Calculation (min) (ACUTE ONLY): 26 min   Charges:   PT Evaluation $PT Eval Moderate Complexity: 1 Mod PT Treatments $Therapeutic  Activity: 8-22 mins PT General Charges $$ ACUTE PT VISIT: 1 Visit         09/12/2023  India HERO., PT Acute Rehabilitation Services [phone]  (office)  Vinie GAILS Disney Ruggiero 09/12/2023, 1:21 PM

## 2023-09-13 ENCOUNTER — Inpatient Hospital Stay (HOSPITAL_COMMUNITY)

## 2023-09-13 LAB — CBC
HCT: 22.7 % — ABNORMAL LOW (ref 36.0–46.0)
Hemoglobin: 7.4 g/dL — ABNORMAL LOW (ref 12.0–15.0)
MCH: 31 pg (ref 26.0–34.0)
MCHC: 32.6 g/dL (ref 30.0–36.0)
MCV: 95 fL (ref 80.0–100.0)
Platelets: 211 K/uL (ref 150–400)
RBC: 2.39 MIL/uL — ABNORMAL LOW (ref 3.87–5.11)
RDW: 15.2 % (ref 11.5–15.5)
WBC: 5.5 K/uL (ref 4.0–10.5)
nRBC: 0 % (ref 0.0–0.2)

## 2023-09-13 LAB — BASIC METABOLIC PANEL WITH GFR
Anion gap: 11 (ref 5–15)
BUN: 92 mg/dL — ABNORMAL HIGH (ref 8–23)
CO2: 21 mmol/L — ABNORMAL LOW (ref 22–32)
Calcium: 8.8 mg/dL — ABNORMAL LOW (ref 8.9–10.3)
Chloride: 104 mmol/L (ref 98–111)
Creatinine, Ser: 2.83 mg/dL — ABNORMAL HIGH (ref 0.44–1.00)
GFR, Estimated: 15 mL/min — ABNORMAL LOW (ref >60)
Glucose, Bld: 88 mg/dL (ref 70–99)
Potassium: 4.5 mmol/L (ref 3.5–5.1)
Sodium: 136 mmol/L (ref 135–145)

## 2023-09-13 LAB — URINALYSIS, W/ REFLEX TO CULTURE (INFECTION SUSPECTED)
Bilirubin Urine: NEGATIVE
Glucose, UA: NEGATIVE mg/dL
Hgb urine dipstick: NEGATIVE
Ketones, ur: NEGATIVE mg/dL
Nitrite: NEGATIVE
Protein, ur: NEGATIVE mg/dL
Specific Gravity, Urine: 1.009 (ref 1.005–1.030)
pH: 5 (ref 5.0–8.0)

## 2023-09-13 LAB — GLUCOSE, CAPILLARY
Glucose-Capillary: 88 mg/dL (ref 70–99)
Glucose-Capillary: 184 mg/dL — ABNORMAL HIGH (ref 70–99)
Glucose-Capillary: 211 mg/dL — ABNORMAL HIGH (ref 70–99)
Glucose-Capillary: 126 mg/dL — ABNORMAL HIGH (ref 70–99)

## 2023-09-13 NOTE — NC FL2 (Signed)
 [place]  MEDICAID FL2 LEVEL OF CARE FORM     IDENTIFICATION  Patient Name: [name] Birthdate: 23-Oct-1927 Sex: female Admission Date (Current Location): 09/07/2023  [facility] and [place] Number:  Producer, television/film/video and Address:  [facility] [place], [address] [place], [place] [address]      Provider Number: [id number]  Attending Physician Name and Address:  Elpidio Reyes DEL, MD  Relative Name and Phone Number:  Rivka Baune (son) [phone]    Current Level of Care: Hospital Recommended Level of Care: Skilled Nursing Facility Prior Approval Number:    Date Approved/Denied:   PASRR Number: [id number]  Discharge Plan: SNF    Current Diagnoses: Patient Active Problem List   Diagnosis Date Noted   Acute exacerbation of CHF (congestive heart failure) (HCC) 09/08/2023   Acute CHF (congestive heart failure) (HCC) 09/07/2023   Failure to thrive in adult 08/07/2023   Dehydration 08/06/2023   SVC syndrome 05/12/2023   Anemia of chronic disease 02/12/2023   History of CVA (cerebrovascular accident) 08/07/2022   History of CAD (coronary artery disease) of artery bypass graft 08/07/2022   Arthritis of right hip 08/06/2021   Unilateral primary osteoarthritis, right knee 08/06/2021   Closed compression fracture of body of L1 vertebra (HCC) 07/05/2021   Peripheral arterial disease (HCC) 03/17/2021   Onychomycosis 02/28/2021   Right leg pain 07/26/2020   Onychodystrophy 07/26/2020   CKD (chronic kidney disease), stage IV (HCC) 07/19/2020   Hypertensive heart and renal disease 03/22/2020   Gout 12/07/2017   DM type 2 (diabetes mellitus, type 2) (HCC) 02/11/2014   History of coronary artery bypass graft 07/01/2010   MYOCARDIAL INFARCTION, INFERIOR WALL, SUBSEQUENT CARE 05/12/2008   CAD, NATIVE VESSEL 05/12/2008   Hyperlipidemia 05/11/2008   Essential hypertension 05/11/2008   CEREBROVASCULAR DISEASE 05/11/2008   Rheumatoid  arthritis (HCC) 05/11/2008    Orientation RESPIRATION BLADDER Height & Weight     Self, Time, Situation, Place  Normal Continent, External catheter (External Urinary Catheter) Weight: 130 lb 4.7 oz (59.1 kg) Height:  5' 4.02 (162.6 cm)  BEHAVIORAL SYMPTOMS/MOOD NEUROLOGICAL BOWEL NUTRITION STATUS      Incontinent Diet (Please see discharge summary)  AMBULATORY STATUS COMMUNICATION OF NEEDS Skin   Extensive Assist Verbally Other (Comment) (WDL,Wound/Incision LDAs)                       Personal Care Assistance Level of Assistance  Bathing, Feeding, Dressing Bathing Assistance: Maximum assistance Feeding assistance: Limited assistance Dressing Assistance: Maximum assistance     Functional Limitations Info  Sight, Hearing, Speech Sight Info: Impaired (reading glasses) Hearing Info: Adequate Speech Info: Adequate    SPECIAL CARE FACTORS FREQUENCY  PT (By licensed PT), OT (By licensed OT)     PT Frequency: 5x min weekly OT Frequency: 5x min weekly            Contractures Contractures Info: Not present    Additional Factors Info  Code Status, Allergies, Insulin  Sliding Scale, Psychotropic Code Status Info: DNR Allergies Info: Cyclobenzaprine ,Tramadol  Psychotropic Info: melatonin tablet 3 mg daily at bedtime Insulin  Sliding Scale Info: insulin  aspart (novoLOG ) injection 0-9 Units 3 times daily with meals       Current Medications (09/13/2023):  This is the current hospital active medication list Current Facility-Administered Medications  Medication Dose Route Frequency Provider Last Rate Last Admin   acetaminophen  (TYLENOL ) tablet 650 mg  650 mg Oral Q6H PRN Seena Marsa NOVAK, MD  650 mg at 09/12/23 1113   Or   acetaminophen  (TYLENOL ) suppository 650 mg  650 mg Rectal Q6H PRN Seena Marsa NOVAK, MD       albuterol  (PROVENTIL ) (2.5 MG/3ML) 0.083% nebulizer solution 2.5 mg  2.5 mg Nebulization Q4H PRN Sundil, Subrina, MD   2.5 mg at 09/08/23 0523   alum & mag  hydroxide-simeth (MAALOX/MYLANTA) 200-200-20 MG/5ML suspension 15 mL  15 mL Oral Q4H PRN Lama, Gagan S, MD   15 mL at 09/10/23 1110   amLODipine  (NORVASC ) tablet 10 mg  10 mg Oral Daily Elpidio Reyes DEL, MD   10 mg at 09/13/23 1055   carvedilol  (COREG ) tablet 6.25 mg  6.25 mg Oral BID WC Melvin, Alexander B, MD   6.25 mg at 09/13/23 0800   clopidogrel  (PLAVIX ) tablet 75 mg  75 mg Oral Daily Melvin, Alexander B, MD   75 mg at 09/13/23 1057   dextrose  50 % solution 25 mL  25 mL Intravenous PRN Sundil, Subrina, MD       furosemide  (LASIX ) tablet 40 mg  40 mg Oral Daily Elpidio Reyes DEL, MD   40 mg at 09/13/23 1056   heparin  injection 5,000 Units  5,000 Units Subcutaneous Q8H Seena Marsa NOVAK, MD   5,000 Units at 09/13/23 0757   hydrALAZINE  (APRESOLINE ) injection 10 mg  10 mg Intravenous Q6H PRN Sundil, Subrina, MD   10 mg at 09/12/23 1621   hydroxychloroquine  (PLAQUENIL ) tablet 200 mg  200 mg Oral Daily Melvin, Alexander B, MD   200 mg at 09/13/23 1105   insulin  aspart (novoLOG ) injection 0-9 Units  0-9 Units Subcutaneous TID WC Melvin, Alexander B, MD   1 Units at 09/12/23 1757   lidocaine  (LIDODERM ) 5 % 1 patch  1 patch Transdermal Q12H PRN Melvin, Alexander B, MD       melatonin tablet 3 mg  3 mg Oral QHS Melvin, Alexander B, MD   3 mg at 09/12/23 2127   morphine  (PF) 2 MG/ML injection 1 mg  1 mg Intravenous Q4H PRN Sundil, Subrina, MD   1 mg at 09/11/23 0341   pantoprazole  (PROTONIX ) EC tablet 40 mg  40 mg Oral Daily Melvin, Alexander B, MD   40 mg at 09/13/23 1055   polyethylene glycol (MIRALAX  / GLYCOLAX ) packet 17 g  17 g Oral Daily PRN Melvin, Alexander B, MD   17 g at 09/08/23 1823   rosuvastatin  (CRESTOR ) tablet 10 mg  10 mg Oral QPM Melvin, Alexander B, MD   10 mg at 09/12/23 1757   sodium chloride  flush (NS) 0.9 % injection 3 mL  3 mL Intravenous Q12H Seena Marsa NOVAK, MD   3 mL at 09/12/23 2128     Discharge Medications: Please see discharge summary for a list of discharge  medications.  Relevant Imaging Results:  Relevant Lab Results:   Additional Information [id number]  Isaiah Public, LCSWA

## 2023-09-13 NOTE — Progress Notes (Signed)
 PROGRESS NOTE  [name]  FMW:[id number] DOB: [birth year] DOA: 09/07/2023 PCP: Ileen [name], NP  Consultants  Brief Narrative: 89 y.o. female with a PMH significant for chronically ill, wheelchair-bound with history of CVA, diabetes, dislocated right shoulder, CKD 4, CABG, PAD, gout, rheumatoid arthritis presented to the ED with shortness of breath and swelling.  Of note, pt had recent hospitalization 6/19-25 with encephalopathy which was felt to be multifactorial related to polypharmacy UTI etc.  Subsequently after discharge home noticed worsening edema in lower legs along with progressive dyspnea on exertion which eventually led to, this ER presentation and admission.  In ED BNP 803 and chest x-ray concerning for CHF and basilar collapse/atelectasis.  [facility] hospital[facility] called for admission and started on diuretics for treatment for acute CHF exacerbation.  Assessment & Plan: Acute CHF, likely diastolic/pulmonary hypertension -Echo last year noted preserved EF, grade 1 DD, normal RV -Repeat echo showed EF of 60 to 65%, left ventricular diastolic parameters are indeterminate -She has hypoalbuminemia as well which is likely contributing to third spacing -She is down about 5.5 L since admission.   - Oral torsemide started 7/27.  Will follow output.   -Conservative management on account of advanced age frailty, poor functional status   CKD 4 -Creatinine in the 2.3-2.6 range of baseline, relatively stable, monitor with diuresis, avoid hypotension - Creatinine has held steady at 2.8.  Continue oral torsemide   Dysphagia/aspiration -Patient complains of cough after eating - Swallow eval negative. - Possibility of GERD, although she is on Protonix .  No actual choking episodes, no globus sensation, just reports cough 2 to 3 minutes after eating. - Spoke with son who states cough is mild.  They will plan to follow-up outpatient with her being on Protonix    Acute on chronic  anemia - Multifactorial, CKD plus severe iron  deficiency. -She is status post 1 dose of IV iron . - She has had slow decrease in hemoglobin since admission.  Will continue to monitor and transfuse for hemoglobin less than 7 -She is asymptomatic   Left hand swelling:  -present for 1 to 2 months if not a bit longer.  Family is concerned about this.  She was told previously was because she had had an IV line infiltrated but that was over 6 weeks ago. - Plan will be to obtain MRI today.  She has had a negative vascular study of her left upper extremity this admission   Hypertension - Continue Coreg , diuretics as above   Hyperlipidemia - Continue rosuvastatin    Diabetes - CBGs are controlled, A1c is 5.2   PAD CAD > Status post CABG - Continue home Coreg , rosuvastatin , Plavix    History of CVA - Continue rosuvastatin , Plavix    Rheumatoid arthritis - Continue home Plaquenil    Chronic right shoulder dislocation -Noted  DVT prophylaxis:  heparin  injection 5,000 Units Start: 09/07/23 1600 Family Communication: Spoke with son and daughter-in-law who is a Lawyer Code Status:   Code Status: Do not attempt resuscitation (DNR) PRE-ARREST INTERVENTIONS DESIRED Level of care: Telemetry Cardiac Status is: Inpatient Dispo: Moving towards discharge.  Possibly as early as tomorrow.  Plan will be SNF at discharge.   Subjective: Patient awake and alert today.  Worked well with physical therapy.  Reports that she feels that she is back at her baseline.  Coughing after eating is still present but better.  Objective: Vitals:   09/13/23 0456 09/13/23 0720 09/13/23 1110 09/13/23 1500  BP:  (!) 153/52 (!) 154/52 (!) 143/56  Pulse:  70 65 67  Resp:  18 18   Temp:  97.7 F (36.5 C) 97.7 F (36.5 C) 97.8 F (36.6 C)  TempSrc:  Oral Oral Oral  SpO2:  94% 96%   Weight: 59.1 kg     Height:        Intake/Output Summary (Last 24 hours) at 09/13/2023 1535 Last data filed at 09/13/2023 1432 Gross  per 24 hour  Intake 5 ml  Output 500 ml  Net -495 ml   Filed Weights   09/11/23 0452 09/12/23 0421 09/13/23 0456  Weight: 59.5 kg 61.3 kg 59.1 kg   Body mass index is 22.35 kg/m.  Gen: 89 y.o. female in no apparent distress.  Nontoxic, frail appearing, slumped over in bed Pulm: Non-labored breathing.  Clear to auscultation bilaterally.  CV: Regular rate and rhythm.  GI: Abdomen soft, non-tender, non-distended Ext: Warm, no deformities,  Skin: No rashes, lesions  Neuro: Alert and oriented to person and place. No focal neurological deficits. Psych: Calm  Judgement and insight appear normal. Mood & affect appropriate.     I have personally reviewed the following labs and images: CBC: Recent Labs  Lab 09/08/23 0208 09/09/23 0300 09/10/23 0249 09/12/23 0359 09/13/23 0253  WBC 3.4* 3.1* 4.3 5.1 5.5  HGB 7.8* 7.4* 7.6* 7.1* 7.4*  HCT 23.7* 22.6* 23.4* 22.1* 22.7*  MCV 93.3 94.6 95.1 95.7 95.0  PLT 212 207 211 200 211   BMP &GFR Recent Labs  Lab 09/07/23 1659 09/08/23 0208 09/09/23 0300 09/10/23 0249 09/11/23 0251 09/12/23 0359 09/13/23 0253  NA  --    < > 136 135 138 137 136  K  --    < > 4.7 4.7 4.6 4.7 4.5  CL  --    < > 107 104 105 106 104  CO2  --    < > 18* 20* 21* 22 21*  GLUCOSE  --    < > 85 82 79 77 88  BUN  --    < > 76* 76* 83* 88* 92*  CREATININE  --    < > 2.35* 2.59* 2.53* 2.88* 2.83*  CALCIUM   --    < > 8.8* 8.9 8.9 8.8* 8.8*  MG 2.0  --   --   --   --   --   --    < > = values in this interval not displayed.   Estimated Creatinine Clearance: 10.5 mL/min (A) (by C-G formula based on SCr of 2.83 mg/dL (H)). Liver & Pancreas: Recent Labs  Lab 09/08/23 0208  AST 17  ALT 13  ALKPHOS 71  BILITOT 0.7  PROT 5.7*  ALBUMIN 2.8*   No results for input(s): LIPASE, AMYLASE in the last 168 hours. No results for input(s): AMMONIA in the last 168 hours. Diabetic: No results for input(s): HGBA1C in the last 72 hours. Recent Labs  Lab  09/12/23 1029 09/12/23 1703 09/12/23 2117 09/13/23 0624 09/13/23 1108  GLUCAP 149* 133* 145* 88 184*   Cardiac Enzymes: No results for input(s): CKTOTAL, CKMB, CKMBINDEX, TROPONINI in the last 168 hours. No results for input(s): PROBNP in the last 8760 hours. Coagulation Profile: No results for input(s): INR, PROTIME in the last 168 hours. Thyroid  Function Tests: No results for input(s): TSH, T4TOTAL, FREET4, T3FREE, THYROIDAB in the last 72 hours. Lipid Profile: No results for input(s): CHOL, HDL, LDLCALC, TRIG, CHOLHDL, LDLDIRECT in the last 72 hours. Anemia Panel: No results for input(s): VITAMINB12, FOLATE, FERRITIN, TIBC, IRON , RETICCTPCT in the  last 72 hours. Urine analysis:    Component Value Date/Time   COLORURINE YELLOW 09/13/2023 0000   APPEARANCEUR HAZY (A) 09/13/2023 0000   LABSPEC 1.009 09/13/2023 0000   PHURINE 5.0 09/13/2023 0000   GLUCOSEU NEGATIVE 09/13/2023 0000   HGBUR NEGATIVE 09/13/2023 0000   BILIRUBINUR NEGATIVE 09/13/2023 0000   BILIRUBINUR negative 11/14/2020 1608   KETONESUR NEGATIVE 09/13/2023 0000   PROTEINUR NEGATIVE 09/13/2023 0000   UROBILINOGEN 0.2 11/14/2020 1608   UROBILINOGEN 0.2 04/26/2009 0014   NITRITE NEGATIVE 09/13/2023 0000   LEUKOCYTESUR LARGE (A) 09/13/2023 0000   Sepsis Labs: Invalid input(s): PROCALCITONIN, LACTICIDVEN  Microbiology: Recent Results (from the past 240 hours)  Resp panel by RT-PCR (RSV, Flu A&B, Covid) Anterior Nasal Swab     Status: None   Collection Time: 09/07/23  4:05 PM   Specimen: Anterior Nasal Swab  Result Value Ref Range Status   SARS Coronavirus 2 by RT PCR NEGATIVE NEGATIVE Final   Influenza A by PCR NEGATIVE NEGATIVE Final   Influenza B by PCR NEGATIVE NEGATIVE Final    Comment: (NOTE) The Xpert Xpress SARS-CoV-2/FLU/RSV plus assay is intended as an aid in the diagnosis of influenza from Nasopharyngeal swab specimens and should not be used as  a sole basis for treatment. Nasal washings and aspirates are unacceptable for Xpert Xpress SARS-CoV-2/FLU/RSV testing.  Fact Sheet for Patients: BloggerCourse.com  Fact Sheet for Healthcare Providers: SeriousBroker.it  This test is not yet approved or cleared by the [place]  FDA and has been authorized for detection and/or diagnosis of SARS-CoV-2 by FDA under an Emergency Use Authorization (EUA). This EUA will remain in effect (meaning this test can be used) for the duration of the COVID-19 declaration under Section 564(b)(1) of the Act, 21 U.S.C. section 360bbb-3(b)(1), unless the authorization is terminated or revoked.     Resp Syncytial Virus by PCR NEGATIVE NEGATIVE Final    Comment: (NOTE) Fact Sheet for Patients: BloggerCourse.com  Fact Sheet for Healthcare Providers: SeriousBroker.it  This test is not yet approved or cleared by the [place]  FDA and has been authorized for detection and/or diagnosis of SARS-CoV-2 by FDA under an Emergency Use Authorization (EUA). This EUA will remain in effect (meaning this test can be used) for the duration of the COVID-19 declaration under Section 564(b)(1) of the Act, 21 U.S.C. section 360bbb-3(b)(1), unless the authorization is terminated or revoked.  Performed at [facility], [address], [place], [place] [address]     Radiology Studies: No results found.  Scheduled Meds:  amLODipine   10 mg Oral Daily   carvedilol   6.25 mg Oral BID WC   clopidogrel   75 mg Oral Daily   furosemide   40 mg Oral Daily   heparin   5,000 Units Subcutaneous Q8H   hydroxychloroquine   200 mg Oral Daily   insulin  aspart  0-9 Units Subcutaneous TID WC   melatonin  3 mg Oral QHS   pantoprazole   40 mg Oral Daily   rosuvastatin   10 mg Oral QPM   sodium chloride  flush  3 mL Intravenous Q12H   Continuous Infusions:   LOS: 5 days    35 minutes with more than 50% spent in reviewing records, counseling patient/family and coordinating care.  Reyes VEAR Gaw, MD [facility] [email or web address] 09/13/2023, 3:35 PM

## 2023-09-13 NOTE — Plan of Care (Signed)
  Problem: Coping: Goal: Level of anxiety will decrease Outcome: Progressing   Problem: Elimination: Goal: Will not experience complications related to urinary retention Outcome: Progressing   

## 2023-09-13 NOTE — TOC Progression Note (Signed)
 Transition of Care [facility]) - Progression Note    Patient Details  Name: [name] MRN: [id number] Date of Birth: 1927/09/02  Transition of Care [facility]) CM/SW Contact  Isaiah Public, LCSWA Phone Number: 09/13/2023, 11:47 AM  Clinical Narrative:     CSW received consult for possible SNF placement at time of discharge. CSW spoke with patient regarding PT recommendation of SNF placement at time of discharge. Patient reports PTA she comes from home with son. Patient expressed understanding of PT recommendation and is agreeable to SNF placement at time of discharge. Patient reports preference for [facility] Patient gave CSW permission to fax out initial referral for SNF placement.CSW discussed insurance authorization process. No further questions reported at this time. CSW to continue to follow and assist with discharge planning needs.   Expected Discharge Plan: Skilled Nursing Facility Barriers to Discharge: Continued Medical Work up               Expected Discharge Plan and Services In-house Referral: Clinical Social Work Discharge Planning Services: CM Consult Post Acute Care Choice: Home Health Living arrangements for the past 2 months: Single Family Home                 DME Arranged: N/A DME Agency: NA       HH Arranged: PT, OT, RN HH Agency: [facility] Date HH Agency Contacted: 09/09/23 Time HH Agency Contacted: 0900 Representative spoke with at [facility] Agency: [facility]   Social Drivers of Health (SDOH) Interventions SDOH Screenings   Food Insecurity: No Food Insecurity (09/07/2023)  Housing: Low Risk  (09/07/2023)  Transportation Needs: No Transportation Needs (09/07/2023)  Utilities: Not At Risk (09/07/2023)  Alcohol Screen: Low Risk  (11/14/2020)  Depression (PHQ2-9): Low Risk  (03/27/2022)  Financial Resource Strain: Low Risk  (03/27/2022)  Physical Activity: Inactive (03/27/2022)  Social Connections: Moderately Isolated (09/07/2023)  Stress: No Stress  Concern Present (03/27/2022)  Tobacco Use: Low Risk  (09/07/2023)    Readmission Risk Interventions    09/09/2023    5:05 PM 08/08/2023   12:05 PM 05/14/2023    4:08 PM  Readmission Risk Prevention Plan  Transportation Screening Complete Complete Complete  Medication Review (RN Care Manager) Complete Complete Referral to Pharmacy  PCP or Specialist appointment within 3-5 days of discharge   Complete  HRI or Home Care Consult Complete Complete Complete  SW Recovery Care/Counseling Consult  Complete Complete  Palliative Care Screening Not Applicable Not Applicable Not Applicable  Skilled Nursing Facility Not Applicable Complete Not Applicable

## 2023-09-13 NOTE — Plan of Care (Signed)
 Pt got new MRI to understand R arm edema, current etiology unknown. Otherwise ready for discharge to SNF. Progressed PO Lasix 

## 2023-09-14 DIAGNOSIS — I5031 Acute diastolic (congestive) heart failure: Secondary | ICD-10-CM | POA: Diagnosis not present

## 2023-09-14 LAB — GLUCOSE, CAPILLARY
Glucose-Capillary: 107 mg/dL — ABNORMAL HIGH (ref 70–99)
Glucose-Capillary: 175 mg/dL — ABNORMAL HIGH (ref 70–99)
Glucose-Capillary: 174 mg/dL — ABNORMAL HIGH (ref 70–99)
Glucose-Capillary: 141 mg/dL — ABNORMAL HIGH (ref 70–99)

## 2023-09-14 MED ORDER — OXYCODONE HCL 5 MG PO TABS
5.0000 mg | ORAL_TABLET | Freq: Once | ORAL | Status: AC
  Administered 2023-09-14: 5 mg via ORAL
  Filled 2023-09-14: qty 1

## 2023-09-14 MED ORDER — DICLOFENAC SODIUM 1 % EX GEL
2.0000 g | Freq: Three times a day (TID) | CUTANEOUS | Status: DC | PRN
  Administered 2023-09-15 – 2023-09-16 (×2): 2 g via TOPICAL
  Filled 2023-09-14: qty 100

## 2023-09-14 MED ORDER — CEFADROXIL 500 MG PO CAPS
500.0000 mg | ORAL_CAPSULE | ORAL | Status: DC
  Administered 2023-09-14: 500 mg via ORAL
  Filled 2023-09-14: qty 1

## 2023-09-14 NOTE — TOC Progression Note (Addendum)
 Transition of Care [facility]) - Progression Note    Patient Details  Name: [name] MRN: [id number] Date of Birth: [birth year]  [facility] CM/SW Contact  [name], [place] Phone Number: 09/14/2023, 12:47 PM  Clinical Narrative:   CSW provided patient with medicare.gov ratings for accepting SNFs, CSW informed patient that [name] has declined. Patient inquired about [name]. CSW informed her that they declined as well. Patient chose [name] at this time. CSW to start insurance auth once updated PT note in available.  3:12 PM Auth started for [facility], auth id [id number]. [name] is now pending.   TOC will continue to follow.    Expected Discharge Plan: Skilled Nursing Facility Barriers to Discharge: Continued Medical Work up               Expected Discharge Plan and Services In-house Referral: Clinical Social Work Discharge Planning Services: CM Consult Post Acute Care Choice: Home Health Living arrangements for the past 2 months: Single Family Home                 DME Arranged: N/A DME Agency: NA       HH Arranged: PT, OT, RN HH Agency: [facility] Date HH Agency Contacted: 09/09/23 Time HH Agency Contacted: 0900 Representative spoke with at [facility] Agency: Kiki   Social Drivers of Health (SDOH) Interventions SDOH Screenings   Food Insecurity: No Food Insecurity (09/07/2023)  Housing: Low Risk  (09/07/2023)  Transportation Needs: No Transportation Needs (09/07/2023)  Utilities: Not At Risk (09/07/2023)  Alcohol Screen: Low Risk  (11/14/2020)  Depression (PHQ2-9): Low Risk  (03/27/2022)  Financial Resource Strain: Low Risk  (03/27/2022)  Physical Activity: Inactive (03/27/2022)  Social Connections: Moderately Isolated (09/07/2023)  Stress: No Stress Concern Present (03/27/2022)  Tobacco Use: Low Risk  (09/07/2023)    Readmission Risk Interventions    09/09/2023    5:05 PM 08/08/2023   12:05 PM 05/14/2023    4:08 PM   Readmission Risk Prevention Plan  Transportation Screening Complete Complete Complete  Medication Review (RN Care Manager) Complete Complete Referral to Pharmacy  PCP or Specialist appointment within 3-5 days of discharge   Complete  HRI or Home Care Consult Complete Complete Complete  SW Recovery Care/Counseling Consult  Complete Complete  Palliative Care Screening Not Applicable Not Applicable Not Applicable  Skilled Nursing Facility Not Applicable Complete Not Applicable

## 2023-09-14 NOTE — Progress Notes (Addendum)
 PROGRESS NOTE  [name]  FMW:[id number] DOB: 1928/02/11 DOA: 09/07/2023 PCP: Ileen [name], NP  Consultants  Brief Narrative: 89 y.o. female with a PMH significant for chronically ill, wheelchair-bound with history of CVA, diabetes, dislocated right shoulder, CKD 4, CABG, PAD, gout, rheumatoid arthritis presented to the ED with shortness of breath and swelling.  Of note, pt had recent hospitalization 6/19-25 with encephalopathy which was felt to be multifactorial related to polypharmacy UTI etc.  Subsequently after discharge home noticed worsening edema in lower legs along with progressive dyspnea on exertion which eventually led to, this ER presentation and admission.  In ED BNP 803 and chest x-ray concerning for CHF and basilar collapse/atelectasis.  [facility] hospitalist called for admission and started on diuretics for treatment for acute CHF exacerbation.  Assessment & Plan: Acute CHF, likely diastolic/pulmonary hypertension -Echo last year noted preserved EF, grade 1 DD, normal RV -Repeat echo showed EF of 60 to 65%, left ventricular diastolic parameters are indeterminate -She has hypoalbuminemia as well which is likely contributing to third spacing -She is down about 4.2 L since admission, continues with good output - Oral torsemide started 7/27.  Will follow output.   -Conservative management on account of advanced age frailty, poor functional status   CKD 4 -Creatinine in the 2.3-2.6 range of baseline, relatively stable, monitor with diuresis, avoid hypotension - Creatinine has held steady at 2.8.  Continue oral torsemide -With concurrent metabolic acidosis secondary to CKD.   Dysphagia/aspiration -Patient complains of cough after eating.  No further cough today, however, after breakfast, which is good news.  - Swallow eval negative. - Possibility of GERD, although she is on Protonix .  No actual choking episodes, no globus sensation, just reports cough 2 to 3  minutes after eating. - Spoke with son who states cough is mild.  They will plan to follow-up outpatient with her being on Protonix    Acute on chronic anemia - Multifactorial, CKD plus severe iron  deficiency. -She is status post 1 dose of IV iron . - She has had slow decrease in hemoglobin since admission.  Will continue to monitor and transfuse for hemoglobin less than 7 -She is asymptomatic   Left hand swelling:  -present for 1 to 2 months if not a bit longer.  Family is concerned about this.  - MRI revealed circumferential subcutaneous edema.  No abscess.  Mild synovitis/tenosynovitis and arthropathy.  Also concern for partial tear of TFCC disc at radial attachment -She does have some warmth and redness to her hand which she says is new over the past 2 3 days, superimposed on this chronic hand swelling.  Not sure exact etiology of the swelling other than arthropathy/possible ligamentous tear mentioned above, we will start antibiotics in case of any cellulitis.   Hypertension - Continue Coreg , diuretics as above   Hyperlipidemia - Continue rosuvastatin    Diabetes - CBGs are controlled, A1c is 5.2   PAD CAD > Status post CABG - Continue home Coreg , rosuvastatin , Plavix    History of CVA - Continue rosuvastatin , Plavix    Rheumatoid arthritis - Continue home Plaquenil    Chronic right shoulder dislocation -Noted  Mild hypoglycemia: Added to chart at CDI request  DVT prophylaxis:  heparin  injection 5,000 Units Start: 09/07/23 1600 Family Communication: Spoke with son and daughter-in-law who is a Lawyer Code Status:   Code Status: Do not attempt resuscitation (DNR) PRE-ARREST INTERVENTIONS DESIRED Level of care: Telemetry Cardiac Status is: Inpatient Dispo: Medically ready for discharge.  Awaiting SNF placement.  Subjective: Patient awake and alert today.  Worked well with physical therapy.  Reports that she feels that she is back at her baseline.  No more coughing after  eating.  Objective: Vitals:   09/14/23 0015 09/14/23 0509 09/14/23 0710 09/14/23 1119  BP: (!) 145/71 (!) 153/50 (!) 161/63 (!) 142/61  Pulse: 63 68 71 63  Resp: 18 16 18 18   Temp: 97.7 F (36.5 C) 97.7 F (36.5 C) 97.8 F (36.6 C) 97.7 F (36.5 C)  TempSrc: Oral Oral Oral Oral  SpO2: 94% 97% 95% 96%  Weight:  59.2 kg    Height:        Intake/Output Summary (Last 24 hours) at 09/14/2023 1525 Last data filed at 09/14/2023 1036 Gross per 24 hour  Intake 3 ml  Output 400 ml  Net -397 ml   Filed Weights   09/12/23 0421 09/13/23 0456 09/14/23 0509  Weight: 61.3 kg 59.1 kg 59.2 kg   Body mass index is 22.39 kg/m.  Gen: 89 y.o. female in no apparent distress.  Nontoxic, frail appearing, slumped over in bed Pulm: Non-labored breathing.  Clear to auscultation bilaterally.  CV: Regular rate and rhythm.  GI: Abdomen soft, non-tender, non-distended Ext: Warm, no deformities, left hand with +2 edema around the wrist and some warmth and redness. Skin: No rashes, lesions  Neuro: Alert and oriented to person and place. No focal neurological deficits. Psych: Calm  Judgement and insight appear normal. Mood & affect appropriate.     I have personally reviewed the following labs and images: CBC: Recent Labs  Lab 09/08/23 0208 09/09/23 0300 09/10/23 0249 09/12/23 0359 09/13/23 0253  WBC 3.4* 3.1* 4.3 5.1 5.5  HGB 7.8* 7.4* 7.6* 7.1* 7.4*  HCT 23.7* 22.6* 23.4* 22.1* 22.7*  MCV 93.3 94.6 95.1 95.7 95.0  PLT 212 207 211 200 211   BMP &GFR Recent Labs  Lab 09/07/23 1659 09/08/23 0208 09/09/23 0300 09/10/23 0249 09/11/23 0251 09/12/23 0359 09/13/23 0253  NA  --    < > 136 135 138 137 136  K  --    < > 4.7 4.7 4.6 4.7 4.5  CL  --    < > 107 104 105 106 104  CO2  --    < > 18* 20* 21* 22 21*  GLUCOSE  --    < > 85 82 79 77 88  BUN  --    < > 76* 76* 83* 88* 92*  CREATININE  --    < > 2.35* 2.59* 2.53* 2.88* 2.83*  CALCIUM   --    < > 8.8* 8.9 8.9 8.8* 8.8*  MG 2.0  --    --   --   --   --   --    < > = values in this interval not displayed.   Estimated Creatinine Clearance: 10.5 mL/min (A) (by C-G formula based on SCr of 2.83 mg/dL (H)). Liver & Pancreas: Recent Labs  Lab 09/08/23 0208  AST 17  ALT 13  ALKPHOS 71  BILITOT 0.7  PROT 5.7*  ALBUMIN 2.8*   No results for input(s): LIPASE, AMYLASE in the last 168 hours. No results for input(s): AMMONIA in the last 168 hours. Diabetic: No results for input(s): HGBA1C in the last 72 hours. Recent Labs  Lab 09/13/23 1108 09/13/23 1634 09/13/23 2058 09/14/23 0621 09/14/23 1120  GLUCAP 184* 211* 126* 107* 175*   Cardiac Enzymes: No results for input(s): CKTOTAL, CKMB, CKMBINDEX, TROPONINI in the last 168 hours.  No results for input(s): PROBNP in the last 8760 hours. Coagulation Profile: No results for input(s): INR, PROTIME in the last 168 hours. Thyroid  Function Tests: No results for input(s): TSH, T4TOTAL, FREET4, T3FREE, THYROIDAB in the last 72 hours. Lipid Profile: No results for input(s): CHOL, HDL, LDLCALC, TRIG, CHOLHDL, LDLDIRECT in the last 72 hours. Anemia Panel: No results for input(s): VITAMINB12, FOLATE, FERRITIN, TIBC, IRON , RETICCTPCT in the last 72 hours. Urine analysis:    Component Value Date/Time   COLORURINE YELLOW 09/13/2023 0000   APPEARANCEUR HAZY (A) 09/13/2023 0000   LABSPEC 1.009 09/13/2023 0000   PHURINE 5.0 09/13/2023 0000   GLUCOSEU NEGATIVE 09/13/2023 0000   HGBUR NEGATIVE 09/13/2023 0000   BILIRUBINUR NEGATIVE 09/13/2023 0000   BILIRUBINUR negative 11/14/2020 1608   KETONESUR NEGATIVE 09/13/2023 0000   PROTEINUR NEGATIVE 09/13/2023 0000   UROBILINOGEN 0.2 11/14/2020 1608   UROBILINOGEN 0.2 04/26/2009 0014   NITRITE NEGATIVE 09/13/2023 0000   LEUKOCYTESUR LARGE (A) 09/13/2023 0000   Sepsis Labs: Invalid input(s): PROCALCITONIN, LACTICIDVEN  Microbiology: Recent Results (from the past 240  hours)  Resp panel by RT-PCR (RSV, Flu A&B, Covid) Anterior Nasal Swab     Status: None   Collection Time: 09/07/23  4:05 PM   Specimen: Anterior Nasal Swab  Result Value Ref Range Status   SARS Coronavirus 2 by RT PCR NEGATIVE NEGATIVE Final   Influenza A by PCR NEGATIVE NEGATIVE Final   Influenza B by PCR NEGATIVE NEGATIVE Final    Comment: (NOTE) The Xpert Xpress SARS-CoV-2/FLU/RSV plus assay is intended as an aid in the diagnosis of influenza from Nasopharyngeal swab specimens and should not be used as a sole basis for treatment. Nasal washings and aspirates are unacceptable for Xpert Xpress SARS-CoV-2/FLU/RSV testing.  Fact Sheet for Patients: BloggerCourse.com  Fact Sheet for Healthcare Providers: SeriousBroker.it  This test is not yet approved or cleared by the [place]  FDA and has been authorized for detection and/or diagnosis of SARS-CoV-2 by FDA under an Emergency Use Authorization (EUA). This EUA will remain in effect (meaning this test can be used) for the duration of the COVID-19 declaration under Section 564(b)(1) of the Act, 21 U.S.C. section 360bbb-3(b)(1), unless the authorization is terminated or revoked.     Resp Syncytial Virus by PCR NEGATIVE NEGATIVE Final    Comment: (NOTE) Fact Sheet for Patients: BloggerCourse.com  Fact Sheet for Healthcare Providers: SeriousBroker.it  This test is not yet approved or cleared by the [place]  FDA and has been authorized for detection and/or diagnosis of SARS-CoV-2 by FDA under an Emergency Use Authorization (EUA). This EUA will remain in effect (meaning this test can be used) for the duration of the COVID-19 declaration under Section 564(b)(1) of the Act, 21 U.S.C. section 360bbb-3(b)(1), unless the authorization is terminated or revoked.  Performed at [facility], [address] [place],  [place] [address]   Urine Culture     Status: Abnormal (Preliminary result)   Collection Time: 09/13/23 12:00 AM   Specimen: Urine, Random  Result Value Ref Range Status   Specimen Description URINE, RANDOM  Final   Special Requests NONE Reflexed from [id number]  Final   Culture (A)  Final    >=100,000 COLONIES/mL KLEBSIELLA PNEUMONIAE SUSCEPTIBILITIES TO FOLLOW Performed at [facility], [address] [place], [place] [address]    Report Status PENDING  Incomplete    Radiology Studies: No results found.  Scheduled Meds:  amLODipine   10 mg Oral Daily   carvedilol   6.25 mg Oral BID WC   clopidogrel   75 mg Oral Daily   furosemide   40 mg Oral Daily   heparin   5,000 Units Subcutaneous Q8H   hydroxychloroquine   200 mg Oral Daily   insulin  aspart  0-9 Units Subcutaneous TID WC   melatonin  3 mg Oral QHS   pantoprazole   40 mg Oral Daily   rosuvastatin   10 mg Oral QPM   sodium chloride  flush  3 mL Intravenous Q12H   Continuous Infusions:   LOS: 6 days   35 minutes with more than 50% spent in reviewing records, counseling patient/family and coordinating care.  Reyes VEAR Gaw, MD [facility] [email or web address] 09/14/2023, 3:25 PM

## 2023-09-14 NOTE — Progress Notes (Signed)
 Physical Therapy Treatment Patient Details Name: [name] MRN: [id number] DOB: April 14, 1927 Today's Date: 09/14/2023   History of Present Illness Pt is a 89 y/o female admitted, 7/21 with 1 week period of worsening dyspnea on exertion and edema.  Past medical history significant for CVA in 2003, 12/24, CAD status post CABG, hyperlipidemia, hypertension, CKD stage IV, lumbar radiculopathy, diabetes mellitus type 2 non-insulin -dependent and closed wedge compression fracture of L1 vertebra with routine healing, RA    PT Comments  The pt was eager to progress mobility, presents with BLE flexed in bed at hips and knees, reports this is a position of comfort for her. The pt was educated in LE ROM exercises prior to mobility, able to complete full ROM bilaterally despite preference for hip and knee flexion. The pt continues to demo deficits in power, strength, stability, and motor planning requiring maxA to complete bed mobility and maxA to complete all OOB transfers. Pt with great tolerance for sit-stand transfer practice, but ultimately still needing maxA to achieve upright with poor ability to extend at hips, trunk, or complete pivotal movement to chair. Continue to recommend post-acute therapies <3hours/day to improve functional strength, ROM, and independence with transfers prior to return home.     If plan is discharge home, recommend the following: A lot of help with walking and/or transfers;A lot of help with bathing/dressing/bathroom;Assistance with cooking/housework;Assist for transportation   Can travel by private vehicle     No  Equipment Recommendations  None recommended by PT    Recommendations for Other Services       Precautions / Restrictions Precautions Precautions: Fall Recall of Precautions/Restrictions: Intact Restrictions Weight Bearing Restrictions Per Provider Order: No     Mobility  Bed Mobility Overal bed mobility: Needs Assistance Bed Mobility:  Supine to Sit     Supine to sit: Mod assist, Max assist     General bed mobility comments: assist to complete trunk rotation, move LE to EOB, and scoot to EOB. pt attempting to assist with RUE but poor functional power and strength    Transfers Overall transfer level: Needs assistance Equipment used: Rolling walker (2 wheels), None Transfers: Bed to chair/wheelchair/BSC, Sit to/from Stand Sit to Stand: Max assist Stand pivot transfers: Max assist   Squat pivot transfers: Max assist     General transfer comment: attempted x3 sit-stand from EOB with focus on hip and trunk extension, then attempted with RW x3, then stand/squat pivot to recliner x2. maxA due to poor balance, inability to use LUE, and inability to step to R    Ambulation/Gait               General Gait Details: pt unable to advance RLE for pivotal steps at [facility]   Stairs             Wheelchair Mobility     Tilt Bed    Modified Rankin (Stroke Patients Only)       Balance Overall balance assessment: Needs assistance Sitting-balance support: No upper extremity supported, Single extremity supported Sitting balance-[name]: Fair     Standing balance support: Bilateral upper extremity supported, During functional activity, Reliant on assistive device for balance Standing balance-[name]: Zero Standing balance comment: dependent on BUE support and maxA for static stance                            Communication Communication Communication: No apparent difficulties  Cognition Arousal: Alert Behavior During  Therapy: WFL for tasks assessed/performed   PT - Cognitive impairments: No apparent impairments                         Following commands: Intact      Cueing Cueing Techniques: Verbal cues  Exercises General Exercises - Lower Extremity Quad Sets: Both, 10 reps Long Arc Quad: AROM, Both, 10 reps Heel Slides: AROM, Both, 10 reps Hip Flexion/Marching: AROM,  Both, 10 reps    General Comments General comments (skin integrity, edema, etc.): VSS on RA      Pertinent Vitals/Pain Pain Assessment Pain Assessment: Faces Faces Pain Scale: Hurts a little bit Pain Location: joints with ROM due to stiffness Pain Descriptors / Indicators: Sore Pain Intervention(s): Limited activity within patient's tolerance, Monitored during session, Repositioned    Home Living                          Prior Function            PT Goals (current goals can now be found in the care plan section) Acute Rehab PT Goals Patient Stated Goal: able to do well at my son's home. PT Goal Formulation: With patient Time For Goal Achievement: 09/26/23 Potential to Achieve Goals: Good Progress towards PT goals: Progressing toward goals    Frequency    Min 2X/week      PT Plan      Co-evaluation              AM-PAC PT 6 Clicks Mobility   Outcome Measure  Help needed turning from your back to your side while in a flat bed without using bedrails?: A Lot Help needed moving from lying on your back to sitting on the side of a flat bed without using bedrails?: Total Help needed moving to and from a bed to a chair (including a wheelchair)?: Total Help needed standing up from a chair using your arms (e.g., wheelchair or bedside chair)?: Total Help needed to walk in hospital room?: Total Help needed climbing 3-5 steps with a railing? : Total 6 Click Score: 7    End of Session Equipment Utilized During Treatment: Gait belt Activity Tolerance: Patient tolerated treatment well Patient left: in chair;with Houston bell/phone within reach;with chair alarm set Nurse Communication: Mobility status PT Visit Diagnosis: Muscle weakness (generalized) (M62.81);Other abnormalities of gait and mobility (R26.89)     Time: 1344-1410 PT Time Calculation (min) (ACUTE ONLY): 26 min  Charges:    $Therapeutic Exercise: 8-22 mins $Therapeutic Activity: 8-22  mins PT General Charges $$ ACUTE PT VISIT: 1 Visit                     [name], PT, DPT   Acute Rehabilitation Department Office [phone] Secure Chat Communication Preferred   [name] 09/14/2023, 2:19 PM

## 2023-09-14 NOTE — Plan of Care (Signed)
 Patient up to chair two times.  Heavy assist with ambulation.  Mentation good and vitals stable.

## 2023-09-14 NOTE — Plan of Care (Signed)
  Problem: Nutritional: Goal: Maintenance of adequate nutrition will improve Outcome: Progressing   Problem: Clinical Measurements: Goal: Cardiovascular complication will be avoided Outcome: Progressing   Problem: Activity: Goal: Risk for activity intolerance will decrease Outcome: Progressing

## 2023-09-15 ENCOUNTER — Inpatient Hospital Stay (HOSPITAL_COMMUNITY)

## 2023-09-15 DIAGNOSIS — M7989 Other specified soft tissue disorders: Secondary | ICD-10-CM

## 2023-09-15 DIAGNOSIS — I5031 Acute diastolic (congestive) heart failure: Secondary | ICD-10-CM | POA: Diagnosis not present

## 2023-09-15 LAB — BASIC METABOLIC PANEL WITH GFR
Anion gap: 12 (ref 5–15)
BUN: 90 mg/dL — ABNORMAL HIGH (ref 8–23)
CO2: 21 mmol/L — ABNORMAL LOW (ref 22–32)
Calcium: 9.3 mg/dL (ref 8.9–10.3)
Chloride: 102 mmol/L (ref 98–111)
Creatinine, Ser: 2.66 mg/dL — ABNORMAL HIGH (ref 0.44–1.00)
GFR, Estimated: 16 mL/min — ABNORMAL LOW (ref >60)
Glucose, Bld: 176 mg/dL — ABNORMAL HIGH (ref 70–99)
Potassium: 4.6 mmol/L (ref 3.5–5.1)
Sodium: 135 mmol/L (ref 135–145)

## 2023-09-15 LAB — URINE CULTURE: Culture: >=100000 — AB

## 2023-09-15 LAB — GLUCOSE, CAPILLARY
Glucose-Capillary: 73 mg/dL (ref 70–99)
Glucose-Capillary: 140 mg/dL — ABNORMAL HIGH (ref 70–99)
Glucose-Capillary: 172 mg/dL — ABNORMAL HIGH (ref 70–99)
Glucose-Capillary: 124 mg/dL — ABNORMAL HIGH (ref 70–99)

## 2023-09-15 MED ORDER — CEFAZOLIN SODIUM-DEXTROSE 1-4 GM/50ML-% IV SOLN
1.0000 g | INTRAVENOUS | Status: DC
  Administered 2023-09-16 – 2023-09-17 (×2): 1 g via INTRAVENOUS
  Filled 2023-09-15 (×2): qty 50

## 2023-09-15 MED ORDER — SODIUM CHLORIDE 0.9 % IV SOLN
1.0000 g | INTRAVENOUS | Status: DC
  Administered 2023-09-15: 1 g via INTRAVENOUS
  Filled 2023-09-15: qty 10

## 2023-09-15 NOTE — TOC Progression Note (Signed)
 Transition of Care [facility]) - Progression Note    Patient Details  Name: [name] MRN: [id number] Date of Birth: Aug 21, 1927  [facility] CM/SW Contact  [name], [place] Phone Number: 09/15/2023, 8:36 AM  Clinical Narrative:   [name] approved for [name]; approval dates 09/15/2023-09/17/2023. CSW notified facility.   TOC will continue to follow.    Expected Discharge Plan: Skilled Nursing Facility Barriers to Discharge: Continued Medical Work up               Expected Discharge Plan and Services In-house Referral: Clinical Social Work Discharge Planning Services: CM Consult Post Acute Care Choice: Home Health Living arrangements for the past 2 months: Single Family Home                 DME Arranged: N/A DME Agency: NA       HH Arranged: PT, OT, RN HH Agency: [facility] Date HH Agency Contacted: 09/09/23 Time HH Agency Contacted: 0900 Representative spoke with at [facility] Agency: [facility]   Social Drivers of Health (SDOH) Interventions SDOH Screenings   Food Insecurity: No Food Insecurity (09/07/2023)  Housing: Low Risk  (09/07/2023)  Transportation Needs: No Transportation Needs (09/07/2023)  Utilities: Not At Risk (09/07/2023)  Alcohol Screen: Low Risk  (11/14/2020)  Depression (PHQ2-9): Low Risk  (03/27/2022)  Financial Resource Strain: Low Risk  (03/27/2022)  Physical Activity: Inactive (03/27/2022)  Social Connections: Moderately Isolated (09/07/2023)  Stress: No Stress Concern Present (03/27/2022)  Tobacco Use: Low Risk  (09/07/2023)    Readmission Risk Interventions    09/09/2023    5:05 PM 08/08/2023   12:05 PM 05/14/2023    4:08 PM  Readmission Risk Prevention Plan  Transportation Screening Complete Complete Complete  Medication Review (RN Care Manager) Complete Complete Referral to Pharmacy  PCP or Specialist appointment within 3-5 days of discharge   Complete  HRI or Home Care Consult Complete Complete Complete  SW  Recovery Care/Counseling Consult  Complete Complete  Palliative Care Screening Not Applicable Not Applicable Not Applicable  Skilled Nursing Facility Not Applicable Complete Not Applicable

## 2023-09-15 NOTE — Progress Notes (Signed)
 Physical Therapy Treatment Patient Details Name: [name] MRN: [id number] DOB: October 18, 1927 Today's Date: 09/15/2023   History of Present Illness 89 y/o female admitted 09/07/23 with DOE, LB edema with acute CHF. PMhx: CVA, CAD s/p CABG, HLD, HTN, CKD, lumbar radiculopathy, T2DM, RA, gout    PT Comments  Pt pleasant with kyphotic posture and reports she doesn't understand why she is so weak. Pt with improved ability to pivot to EOB toward right with HOB 50 degrees but remains max assist to rise and pivot OOB to chair. Pt states she can stay with son but he is not present to confirm or witness level of physical assist currently required for pt. Pt abel to perform seated HEp but very limited by poor trunk and UB control and posture. Patient will benefit from continued inpatient follow up therapy, <3 hours/day    If plan is discharge home, recommend the following: A lot of help with walking and/or transfers;A lot of help with bathing/dressing/bathroom;Assistance with cooking/housework;Assist for transportation   Can travel by private vehicle     No  Equipment Recommendations  None recommended by PT    Recommendations for Other Services       Precautions / Restrictions Precautions Precautions: Fall Recall of Precautions/Restrictions: Intact     Mobility  Bed Mobility Overal bed mobility: Needs Assistance Bed Mobility: Supine to Sit     Supine to sit: Min assist, HOB elevated, Used rails     General bed mobility comments: HOB 50 degrees with use of rail, increased time and momentum to pivot to right side of bed as she does at home. min assist to fully scoot to EOB    Transfers Overall transfer level: Needs assistance   Transfers: Bed to chair/wheelchair/BSC       Squat pivot transfers: Max assist     General transfer comment: max assist squat pivot toward pt left from EOB as she reports she transfers on her own at home. Pt with limited ability to move feet,  maintains fully flexed posture with little to no trunk control requiring physical assist. Mod assist to scoot back in chair    Ambulation/Gait               General Gait Details: unable   Stairs             Wheelchair Mobility     Tilt Bed    Modified Rankin (Stroke Patients Only)       Balance Overall balance assessment: Needs assistance Sitting-balance support: No upper extremity supported, Single extremity supported Sitting balance-[name]: Fair     Standing balance support: Bilateral upper extremity supported, During functional activity Standing balance-[name]: Zero Standing balance comment: UB support on therapist with max assist for static standing                            Communication Communication Communication: No apparent difficulties  Cognition Arousal: Alert Behavior During Therapy: WFL for tasks assessed/performed   PT - Cognitive impairments: Safety/Judgement, Problem solving                       PT - Cognition Comments: pt continues to state concern over LUE edema, per chart baseline and limited awareness of deficits or need for assist Following commands: Impaired Following commands impaired: Follows one step commands with increased time    Cueing Cueing Techniques: Verbal cues, Gestural cues  Exercises General Exercises -  Lower Extremity Long Arc Quad: AROM, Both, 15 reps, Seated, Strengthening Hip ABduction/ADduction: AAROM, Both, 15 reps, Seated, Strengthening Hip Flexion/Marching: AROM, Both, 15 reps, Seated, Strengthening    General Comments        Pertinent Vitals/Pain Pain Assessment Pain Score: 4  Pain Location: joints with ROM due to stiffness, LUE Pain Descriptors / Indicators: Aching, Sore Pain Intervention(s): Limited activity within patient's tolerance, Monitored during session, Repositioned    Home Living                          Prior Function            PT Goals  (current goals can now be found in the care plan section) Progress towards PT goals: Progressing toward goals    Frequency    Min 1X/week      PT Plan      Co-evaluation              AM-PAC PT 6 Clicks Mobility   Outcome Measure  Help needed turning from your back to your side while in a flat bed without using bedrails?: A Lot Help needed moving from lying on your back to sitting on the side of a flat bed without using bedrails?: A Lot Help needed moving to and from a bed to a chair (including a wheelchair)?: Total Help needed standing up from a chair using your arms (e.g., wheelchair or bedside chair)?: Total Help needed to walk in hospital room?: Total Help needed climbing 3-5 steps with a railing? : Total 6 Click Score: 8    End of Session Equipment Utilized During Treatment: Gait belt Activity Tolerance: Patient tolerated treatment well Patient left: in chair;with call bell/phone within reach;with chair alarm set;with nursing/sitter in room Nurse Communication: Mobility status PT Visit Diagnosis: Muscle weakness (generalized) (M62.81);Other abnormalities of gait and mobility (R26.89)     Time: 9077-9056 PT Time Calculation (min) (ACUTE ONLY): 21 min  Charges:    $Therapeutic Activity: 8-22 mins PT General Charges $$ ACUTE PT VISIT: 1 Visit                     Lenoard SQUIBB, PT Acute Rehabilitation Services Office: [phone]    Lenoard NOVAK Darchelle Nunes 09/15/2023, 10:57 AM

## 2023-09-15 NOTE — Progress Notes (Signed)
 PROGRESS NOTE  [name]  FMW:[id number] DOB: 1928-01-30 DOA: 09/07/2023 PCP: Ileen [name], NP  Consultants  Brief Narrative: 89 y.o. female with a PMH significant for chronically ill, wheelchair-bound with history of CVA, diabetes, dislocated right shoulder, CKD 4, CABG, PAD, gout, rheumatoid arthritis presented to the ED with shortness of breath and swelling.  Of note, pt had recent hospitalization 6/19-25 with encephalopathy which was felt to be multifactorial related to polypharmacy UTI etc.  Subsequently after discharge home noticed worsening edema in lower legs along with progressive dyspnea on exertion which eventually led to, this ER presentation and admission.  In ED BNP 803 and chest x-ray concerning for CHF and basilar collapse/atelectasis.  [facility] hospital[facility] called for admission and started on diuretics for treatment for acute CHF exacerbation.  Assessment & Plan: Acute CHF, likely diastolic/pulmonary hypertension -Echo last year noted preserved EF, grade 1 DD, normal RV -Repeat echo showed EF of 60 to 65%, left ventricular diastolic parameters are indeterminate -She has hypoalbuminemia as well which is likely contributing to third spacing - Oral torsemide started 7/27.  Output good since switch to oral diuretics.   -Conservative management on account of advanced age frailty, poor functional status  UTI: - Klebsiella UTI dx'ed - Sensitive to cefazolin .  On the same   RLE edema: - reports that this is new for her. - she does have a doppler from March of this year on that leg.  Hasn't really been as notably swollen for me this admission - Doppler here negative.   - Other than her Left arm which has been chronic for months prior to admission, no other signs of fluid overload.   - if other signs of overload, would increase torsemide/restart IV diuretics.     CKD 4 -Creatinine in the 2.3-2.7 range of baseline, relatively stable, monitor with diuresis, avoid  hypotension - Creatinine has held steady at 2.8.  Continue oral torsemide -With concurrent metabolic acidosis secondary to CKD.  Defer bicarb to outpt nephrology    Dysphagia/aspiration -Patient complains of cough after eating.  No further cough today, however, after breakfast, which is good news.  - Swallow eval negative. - Possibility of GERD, although she is on Protonix .  No actual choking episodes, no globus sensation, just reports cough 2 to 3 minutes after eating. - Spoke with son who states cough is mild.  They will plan to follow-up outpatient with her being on Protonix  - Regardless, this seems to be improving and no longer an issue   Acute on chronic anemia - Multifactorial, CKD plus severe iron  deficiency. -She is status post 1 dose of IV iron . - She has had slow decrease in hemoglobin since admission.  Will continue to monitor and transfuse for hemoglobin less than 7 -She is asymptomatic   Left hand swelling:  -present for 1 to 2 months if not a bit longer.  Family is concerned about this.  - MRI revealed circumferential subcutaneous edema.  No abscess.  Mild synovitis/tenosynovitis and arthropathy.  Also concern for partial tear of TFCC disc at radial attachment -She does have some warmth and redness to her hand which she says is new over the past 2 3 days, superimposed on this chronic hand swelling.  Not sure exact etiology of the swelling other than arthropathy/possible ligamentous tear mentioned above, we will start antibiotics in case of any cellulitis.   Hypertension - Continue Coreg , diuretics as above   Hyperlipidemia - Continue rosuvastatin    Diabetes - CBGs are controlled, A1c  is 5.2   PAD CAD > Status post CABG - Continue home Coreg , rosuvastatin , Plavix    History of CVA - Continue rosuvastatin , Plavix    Rheumatoid arthritis - Continue home Plaquenil    Chronic right shoulder dislocation -Noted  Mild hypoglycemia: Added to chart at [facility] request  DVT  prophylaxis:  heparin  injection 5,000 Units Start: 09/07/23 1600 Family Communication: Spoke with son and daughter-in-law who is a Lawyer Code Status:   Code Status: Do not attempt resuscitation (DNR) PRE-ARREST INTERVENTIONS DESIRED Level of care: Telemetry Cardiac Status is: Inpatient Dispo: Medically ready for discharge.  Awaiting SNF placement.   Subjective: Patient awake and alert today.  Worked well with physical therapy.  Reports new Right leg swelling today.  No more coughing after eating.  Objective: Vitals:   09/15/23 0017 09/15/23 0423 09/15/23 0716 09/15/23 1115  BP: (!) 133/46 (!) 121/37  (!) 153/62  Pulse: 66 60 66 64  Resp: 17 18 18 18   Temp: 97.7 F (36.5 C) 97.6 F (36.4 C) 97.7 F (36.5 C) (!) 97.4 F (36.3 C)  TempSrc: Oral Oral Oral Oral  SpO2: 97% 93% 94% 99%  Weight:  61 kg    Height:        Intake/Output Summary (Last 24 hours) at 09/15/2023 1326 Last data filed at 09/15/2023 0839 Gross per 24 hour  Intake 240 ml  Output 700 ml  Net -460 ml   Filed Weights   09/13/23 0456 09/14/23 0509 09/15/23 0423  Weight: 59.1 kg 59.2 kg 61 kg   Body mass index is 23.07 kg/m.  Gen: 89 y.o. female in no apparent distress.  Nontoxic, frail appearing, slumped over in bed Pulm: Non-labored breathing.  Clear to auscultation bilaterally.  CV: Regular rate and rhythm.  GI: Abdomen soft, non-tender, non-distended Ext: Warm, no deformities, left hand with +2 edema around the wrist and some warmth and redness.  Now with +1 edema Right calf without warmth or redness Skin: No rashes, lesions  Neuro: Alert and oriented to person and place. No focal neurological deficits. Psych: Calm  Judgement and insight appear normal. Mood & affect appropriate.     I have personally reviewed the following labs and images: CBC: Recent Labs  Lab 09/09/23 0300 09/10/23 0249 09/12/23 0359 09/13/23 0253  WBC 3.1* 4.3 5.1 5.5  HGB 7.4* 7.6* 7.1* 7.4*  HCT 22.6* 23.4* 22.1* 22.7*   MCV 94.6 95.1 95.7 95.0  PLT 207 211 200 211   BMP &GFR Recent Labs  Lab 09/09/23 0300 09/10/23 0249 09/11/23 0251 09/12/23 0359 09/13/23 0253  NA 136 135 138 137 136  K 4.7 4.7 4.6 4.7 4.5  CL 107 104 105 106 104  CO2 18* 20* 21* 22 21*  GLUCOSE 85 82 79 77 88  BUN 76* 76* 83* 88* 92*  CREATININE 2.35* 2.59* 2.53* 2.88* 2.83*  CALCIUM  8.8* 8.9 8.9 8.8* 8.8*   Estimated Creatinine Clearance: 10.5 mL/min (A) (by C-G formula based on SCr of 2.83 mg/dL (H)). Liver & Pancreas: No results for input(s): AST, ALT, ALKPHOS, BILITOT, PROT, ALBUMIN in the last 168 hours.  No results for input(s): LIPASE, AMYLASE in the last 168 hours. No results for input(s): AMMONIA in the last 168 hours. Diabetic: No results for input(s): HGBA1C in the last 72 hours. Recent Labs  Lab 09/14/23 1120 09/14/23 1617 09/14/23 2129 09/15/23 0622 09/15/23 1114  GLUCAP 175* 174* 141* 73 140*   Cardiac Enzymes: No results for input(s): CKTOTAL, CKMB, CKMBINDEX, TROPONINI in the last 168  hours. No results for input(s): PROBNP in the last 8760 hours. Coagulation Profile: No results for input(s): INR, PROTIME in the last 168 hours. Thyroid  Function Tests: No results for input(s): TSH, T4TOTAL, FREET4, T3FREE, THYROIDAB in the last 72 hours. Lipid Profile: No results for input(s): CHOL, HDL, LDLCALC, TRIG, CHOLHDL, LDLDIRECT in the last 72 hours. Anemia Panel: No results for input(s): VITAMINB12, FOLATE, FERRITIN, TIBC, IRON , RETICCTPCT in the last 72 hours. Urine analysis:    Component Value Date/Time   COLORURINE YELLOW 09/13/2023 0000   APPEARANCEUR HAZY (A) 09/13/2023 0000   LABSPEC 1.009 09/13/2023 0000   PHURINE 5.0 09/13/2023 0000   GLUCOSEU NEGATIVE 09/13/2023 0000   HGBUR NEGATIVE 09/13/2023 0000   BILIRUBINUR NEGATIVE 09/13/2023 0000   BILIRUBINUR negative 11/14/2020 1608   KETONESUR NEGATIVE 09/13/2023 0000    PROTEINUR NEGATIVE 09/13/2023 0000   UROBILINOGEN 0.2 11/14/2020 1608   UROBILINOGEN 0.2 04/26/2009 0014   NITRITE NEGATIVE 09/13/2023 0000   LEUKOCYTESUR LARGE (A) 09/13/2023 0000   Sepsis Labs: Invalid input(s): PROCALCITONIN, LACTICIDVEN  Microbiology: Recent Results (from the past 240 hours)  Resp panel by RT-PCR (RSV, Flu A&B, Covid) Anterior Nasal Swab     Status: None   Collection Time: 09/07/23  4:05 PM   Specimen: Anterior Nasal Swab  Result Value Ref Range Status   SARS Coronavirus 2 by RT PCR NEGATIVE NEGATIVE Final   Influenza A by PCR NEGATIVE NEGATIVE Final   Influenza B by PCR NEGATIVE NEGATIVE Final    Comment: (NOTE) The Xpert Xpress SARS-CoV-2/FLU/RSV plus assay is intended as an aid in the diagnosis of influenza from Nasopharyngeal swab specimens and should not be used as a sole basis for treatment. Nasal washings and aspirates are unacceptable for Xpert Xpress SARS-CoV-2/FLU/RSV testing.  Fact Sheet for Patients: BloggerCourse.com  Fact Sheet for Healthcare Providers: SeriousBroker.it  This test is not yet approved or cleared by the [place]  FDA and has been authorized for detection and/or diagnosis of SARS-CoV-2 by FDA under an Emergency Use Authorization (EUA). This EUA will remain in effect (meaning this test can be used) for the duration of the COVID-19 declaration under Section 564(b)(1) of the Act, 21 U.S.C. section 360bbb-3(b)(1), unless the authorization is terminated or revoked.     Resp Syncytial Virus by PCR NEGATIVE NEGATIVE Final    Comment: (NOTE) Fact Sheet for Patients: BloggerCourse.com  Fact Sheet for Healthcare Providers: SeriousBroker.it  This test is not yet approved or cleared by the [place]  FDA and has been authorized for detection and/or diagnosis of SARS-CoV-2 by FDA under an Emergency Use Authorization (EUA).  This EUA will remain in effect (meaning this test can be used) for the duration of the COVID-19 declaration under Section 564(b)(1) of the Act, 21 U.S.C. section 360bbb-3(b)(1), unless the authorization is terminated or revoked.  Performed at [facility], [address], [place], [place] [address]   Urine Culture     Status: Abnormal   Collection Time: 09/13/23 12:00 AM   Specimen: Urine, Random  Result Value Ref Range Status   Specimen Description URINE, RANDOM  Final   Special Requests   Final    NONE Reflexed from [id number] Performed at [facility], [address], [place], [place] [address]    Culture >=100,000 COLONIES/mL KLEBSIELLA PNEUMONIAE (A)  Final   Report Status 09/15/2023 FINAL  Final   Organism ID, Bacteria KLEBSIELLA PNEUMONIAE (A)  Final      Susceptibility   Klebsiella pneumoniae - MIC*  AMPICILLIN RESISTANT Resistant     CEFAZOLIN  <=4 SENSITIVE Sensitive     CEFEPIME <=0.12 SENSITIVE Sensitive     CEFTRIAXONE  <=0.25 SENSITIVE Sensitive     CIPROFLOXACIN <=0.25 SENSITIVE Sensitive     GENTAMICIN <=1 SENSITIVE Sensitive     IMIPENEM <=0.25 SENSITIVE Sensitive     NITROFURANTOIN 64 INTERMEDIATE Intermediate     TRIMETH/SULFA <=20 SENSITIVE Sensitive     AMPICILLIN/SULBACTAM <=2 SENSITIVE Sensitive     PIP/TAZO <=4 SENSITIVE Sensitive ug/mL    * >=100,000 COLONIES/mL KLEBSIELLA PNEUMONIAE    Radiology Studies: VAS US  LOWER EXTREMITY VENOUS (DVT) Result Date: 09/15/2023  Lower Venous DVT Study Patient Name:  [name]  Date of Exam:   09/15/2023 Medical Rec #: [id number]                  Accession #:    [id number] Date of Birth: December 20, 1927                  Patient Gender: F Patient Age:   47 years Exam Location:  [facility] Procedure:      VAS US  LOWER EXTREMITY VENOUS (DVT) Referring Phys: Devann Cribb --------------------------------------------------------------------------------  Indications: Swelling, and Edema.  Performing  Technologist: Elmarie Lindau, RVT  Examination Guidelines: A complete evaluation includes B-mode imaging, spectral Doppler, color Doppler, and power Doppler as needed of all accessible portions of each vessel. Bilateral testing is considered an integral part of a complete examination. Limited examinations for reoccurring indications may be performed as noted. The reflux portion of the exam is performed with the patient in reverse Trendelenburg.  +---------+---------------+---------+-----------+----------+--------------+ RIGHT    CompressibilityPhasicitySpontaneityPropertiesThrombus Aging +---------+---------------+---------+-----------+----------+--------------+ CFV      Full           Yes      Yes                                 +---------+---------------+---------+-----------+----------+--------------+ SFJ      Full                                                        +---------+---------------+---------+-----------+----------+--------------+ FV Prox  Full                                                        +---------+---------------+---------+-----------+----------+--------------+ FV Mid   Full                                                        +---------+---------------+---------+-----------+----------+--------------+ FV DistalFull                                                        +---------+---------------+---------+-----------+----------+--------------+ PFV      Full                                                        +---------+---------------+---------+-----------+----------+--------------+  POP      Full           Yes      Yes                                 +---------+---------------+---------+-----------+----------+--------------+ PTV      Full                                                        +---------+---------------+---------+-----------+----------+--------------+ PERO     Full                                                         +---------+---------------+---------+-----------+----------+--------------+   +----+---------------+---------+-----------+----------+--------------+ LEFTCompressibilityPhasicitySpontaneityPropertiesThrombus Aging +----+---------------+---------+-----------+----------+--------------+ CFV Full           Yes      Yes                                 +----+---------------+---------+-----------+----------+--------------+     Summary: RIGHT: - There is no evidence of deep vein thrombosis in the lower extremity.  - No cystic structure found in the popliteal fossa.  LEFT: - No evidence of common femoral vein obstruction.   *See table(s) above for measurements and observations.    Preliminary     Scheduled Meds:  amLODipine   10 mg Oral Daily   carvedilol   6.25 mg Oral BID WC   clopidogrel   75 mg Oral Daily   furosemide   40 mg Oral Daily   heparin   5,000 Units Subcutaneous Q8H   hydroxychloroquine   200 mg Oral Daily   insulin  aspart  0-9 Units Subcutaneous TID WC   melatonin  3 mg Oral QHS   pantoprazole   40 mg Oral Daily   rosuvastatin   10 mg Oral QPM   sodium chloride  flush  3 mL Intravenous Q12H   Continuous Infusions:  [START ON 09/16/2023]  ceFAZolin  (ANCEF ) IV       LOS: 7 days   35 minutes with more than 50% spent in reviewing records, counseling patient/family and coordinating care.  Reyes VEAR Gaw, MD [facility] [email or web address] 09/15/2023, 1:26 PM

## 2023-09-15 NOTE — Plan of Care (Signed)
  Problem: Education: Goal: Ability to describe self-care measures that may prevent or decrease complications (Diabetes Survival Skills Education) will improve 09/15/2023 1655 by Carlin Raspberry, RN Outcome: Progressing 09/15/2023 1655 by Carlin Raspberry, RN Outcome: Progressing Goal: Individualized Educational Video(s) 09/15/2023 1655 by Carlin Raspberry, RN Outcome: Progressing 09/15/2023 1655 by Carlin Raspberry, RN Outcome: Progressing   Problem: Coping: Goal: Ability to adjust to condition or change in health will improve 09/15/2023 1655 by Carlin Raspberry, RN Outcome: Progressing 09/15/2023 1655 by Carlin Raspberry, RN Outcome: Progressing   Problem: Fluid Volume: Goal: Ability to maintain a balanced intake and output will improve 09/15/2023 1655 by Carlin Raspberry, RN Outcome: Progressing 09/15/2023 1655 by Carlin Raspberry, RN Outcome: Progressing

## 2023-09-16 DIAGNOSIS — I5033 Acute on chronic diastolic (congestive) heart failure: Secondary | ICD-10-CM | POA: Diagnosis not present

## 2023-09-16 DIAGNOSIS — I25810 Atherosclerosis of coronary artery bypass graft(s) without angina pectoris: Secondary | ICD-10-CM | POA: Diagnosis not present

## 2023-09-16 DIAGNOSIS — N184 Chronic kidney disease, stage 4 (severe): Secondary | ICD-10-CM | POA: Diagnosis not present

## 2023-09-16 DIAGNOSIS — I10 Essential (primary) hypertension: Secondary | ICD-10-CM | POA: Diagnosis not present

## 2023-09-16 DIAGNOSIS — E1169 Type 2 diabetes mellitus with other specified complication: Secondary | ICD-10-CM

## 2023-09-16 DIAGNOSIS — M109 Gout, unspecified: Secondary | ICD-10-CM

## 2023-09-16 LAB — GLUCOSE, CAPILLARY
Glucose-Capillary: 93 mg/dL (ref 70–99)
Glucose-Capillary: 131 mg/dL — ABNORMAL HIGH (ref 70–99)
Glucose-Capillary: 102 mg/dL — ABNORMAL HIGH (ref 70–99)
Glucose-Capillary: 172 mg/dL — ABNORMAL HIGH (ref 70–99)

## 2023-09-16 MED ORDER — PREDNISONE 20 MG PO TABS
20.0000 mg | ORAL_TABLET | Freq: Every day | ORAL | Status: AC
  Administered 2023-09-16 – 2023-09-18 (×3): 20 mg via ORAL
  Filled 2023-09-16 (×3): qty 1

## 2023-09-16 NOTE — Assessment & Plan Note (Deleted)
 Continue statin and clopidogrel .  Blood pressure control.

## 2023-09-16 NOTE — Plan of Care (Signed)
  Problem: Coping: Goal: Ability to adjust to condition or change in health will improve Outcome: Progressing   Problem: Health Behavior/Discharge Planning: Goal: Ability to manage health-related needs will improve Outcome: Progressing   Problem: Metabolic: Goal: Ability to maintain appropriate glucose levels will improve Outcome: Progressing

## 2023-09-16 NOTE — Assessment & Plan Note (Signed)
 Continue blood pressure control with amlodipine  Continue blood pressure monitoring

## 2023-09-16 NOTE — Progress Notes (Signed)
 Occupational Therapy Treatment Patient Details Name: [name] MRN: [id number] DOB: [birth year] Today's Date: 09/16/2023   History of present illness 89 y/o female admitted 09/07/23 with DOE, LB edema with acute CHF. PMhx: CVA, CAD s/p CABG, HLD, HTN, CKD, lumbar radiculopathy, T2DM, RA, gout   OT comments  Patient is making progress toward goals.  Patient requires Mod A for supine to sit with HOB elevated and patient attempted sit to stand  from EOB although unable to stand upright so patient completed squat pivot to bedside chair with max A x1. Patient completed UB grooming task of washing face with Supervision s/p setup  Patient would benefit from additional OT intervention to address functional deficits in order for patient to return to PLOF. Continue with OT POC      If plan is discharge home, recommend the following:  A lot of help with bathing/dressing/bathroom;A lot of help with walking and/or transfers;Assistance with cooking/housework;Assist for transportation;Help with stairs or ramp for entrance;Supervision due to cognitive status   Equipment Recommendations       Recommendations for Other Services      Precautions / Restrictions Precautions Precautions: Fall Recall of Precautions/Restrictions: Intact Restrictions Weight Bearing Restrictions Per Provider Order: No       Mobility Bed Mobility Overal bed mobility: Needs Assistance Bed Mobility: Supine to Sit     Supine to sit: Mod assist, HOB elevated, Used rails          Transfers Overall transfer level: Needs assistance Equipment used: None Transfers: Bed to chair/wheelchair/BSC Sit to Stand: Max assist   Squat pivot transfers: Max assist             Balance                                           ADL either performed or assessed with clinical judgement   ADL Overall ADL's : Needs assistance/impaired                                      Functional mobility during ADLs: Cueing for safety;Cueing for sequencing;Maximal assistance      Extremity/Trunk Assessment              Vision       Perception     Praxis     Communication     Cognition Arousal: Alert Behavior During Therapy: WFL for tasks assessed/performed Cognition: No family/caregiver present to determine baseline                                        Cueing      Exercises      Shoulder Instructions       General Comments      Pertinent Vitals/ Pain       Pain Assessment Pain Assessment: 0-10 Pain Score: 8  Faces Pain Scale: Hurts whole lot Pain Location: L hand which also has noted edema  RN aware and contacting MD Pain Descriptors / Indicators: Aching Pain Intervention(s): Monitored during session  Home Living  Prior Functioning/Environment              Frequency  Min 1X/week        Progress Toward Goals  OT Goals(current goals can now be found in the care plan section)  Progress towards OT goals: Progressing toward goals  Acute Rehab OT Goals OT Goal Formulation: With patient Time For Goal Achievement: 09/26/23 Potential to Achieve Goals: Fair ADL Goals Pt Will Perform Grooming: with supervision;sitting Pt Will Transfer to Toilet: with mod assist;stand pivot transfer;bedside commode Pt Will Perform Toileting - Clothing Manipulation and hygiene: with mod assist;sit to/from stand Additional ADL Goal #1: Pt will return demonstrate AROM/AAROM for the LUE (digits, hand, and elbow) with supervision in order to help increase functional use and decrease edema.  Plan      Co-evaluation                 AM-PAC OT 6 Clicks Daily Activity     Outcome Measure   Help from another person eating meals?: A Little Help from another person taking care of personal grooming?: A Little Help from another person toileting, which includes using  toliet, bedpan, or urinal?: Total Help from another person bathing (including washing, rinsing, drying)?: Total Help from another person to put on and taking off regular upper body clothing?: A Lot Help from another person to put on and taking off regular lower body clothing?: Total 6 Click Score: 11    End of Session    OT Visit Diagnosis: Unsteadiness on feet (R26.81);Other abnormalities of gait and mobility (R26.89);Muscle weakness (generalized) (M62.81);Pain Pain - Right/Left: Left Pain - part of body: Hand   Activity Tolerance Patient tolerated treatment well   Patient Left in chair;with call bell/phone within reach   Nurse Communication Mobility status        Time: 0930-1003 OT Time Calculation (min): 33 min  Charges: OT General Charges $OT Visit: 1 Visit OT Treatments $Self Care/Home Management : 23-37 mins  [name] OT/L [name] 09/16/2023, 1:31 PM

## 2023-09-16 NOTE — Progress Notes (Signed)
 The patient is reporting more and worsening pain in her left hand and now the pain has moved to the right hand. There is no swelling on her right arm.Administered  morphine . Notified MD

## 2023-09-16 NOTE — Assessment & Plan Note (Signed)
 AKI   Renal function with serum cr at 2,6 with K at 4,6 and serum bicarbonate at 21  Na 135   Continue diuresis with furosemide .  Follow up renal function and electrolytes in am.

## 2023-09-16 NOTE — Assessment & Plan Note (Signed)
 Peripheral vascular disease.  Continue blood pressure control, statin and clopidogrel .

## 2023-09-16 NOTE — Assessment & Plan Note (Signed)
 Continue glucose cover and monitoring with insulin sliding scale.  Fasting glucose today 141 mg/dl.   Continue with statin

## 2023-09-16 NOTE — Progress Notes (Signed)
 Progress Note   Patient: [name] FMW:[id number] DOB: 1928/01/28 DOA: 09/07/2023     8 DOS: the patient was seen and examined on 09/16/2023   Brief hospital course: 89 y.o. female with a PMH significant for chronically ill, wheelchair-bound with history of CVA, diabetes, dislocated right shoulder, CKD 4, CABG, PAD, gout, rheumatoid arthritis presented to the ED with shortness of breath and swelling.  Of note, pt had recent hospitalization 6/19-25 with encephalopathy which was felt to be multifactorial related to polypharmacy UTI etc.  Subsequently after discharge home noticed worsening edema in lower legs along with progressive dyspnea on exertion which eventually led to, this ER presentation and admission.  In ED BNP 803 and chest x-ray concerning for CHF and basilar collapse/atelectasis.  [facility] hospitalist called for admission and started on diuretics for treatment for acute CHF exacerbation.   Assessment and Plan: * Acute on chronic diastolic CHF (congestive heart failure) (HCC) Echocardiogram with preserved LV systolic function EF 60 to 65%, RV systolic function preserved, LA with mild dilatation, mild to moderate mitral regurgitation, mild to moderate tricuspid valve regurgitation, no aortic valve stenosis.   Urine output is documented at 500 cc, since admission negative fluid balance at -5,336 ml.  Systolic blood pressure 100 mmHg range.   Plan to continue diuresis with furosemide  40 mg po daily.  Continue carvedilol .  Limited guideline directed medical therapy due patient frailty and risk of hypotension.   History of CAD (coronary artery disease) of artery bypass graft Continue clopidogrel  and statin Blood pressure control.   Essential hypertension Continue blood pressure control with amlodipine .  Continue blood pressure monitoring   CKD (chronic kidney disease), stage IV (HCC) AKI   Renal function with serum cr at 2,6 with K at 4,6 and serum bicarbonate at 21   Na 135   Continue diuresis with furosemide .  Follow up renal function and electrolytes in am.   Rheumatoid arthritis (HCC) No sing of acute flare. Noted hand joint deformities.   History of CVA (cerebrovascular accident) Peripheral vascular disease.  Continue blood pressure control, statin and clopidogrel .    Type 2 diabetes mellitus with hyperlipidemia (HCC) Continue glucose cover and monitoring with insulin  sliding scale.  Continue with statin.   Gout Acute left hand gouty attack Will start oral prednisone  20 mg po daily for 3 days.  Follow up response No Nonsteroidal antiinflammatory agents due to reduced GFR.        Subjective: Patient with improvement in dyspnea and edema, today has pain left hand with positive edema and increased local temperature.   Physical Exam: Vitals:   09/16/23 0730 09/16/23 0907 09/16/23 1135 09/16/23 1535  BP: (!) 165/67 (!) 166/67 (!) 107/46 (!) 100/52  Pulse: 70  63 69  Resp: 18  18 17   Temp: 97.7 F (36.5 C)  98 F (36.7 C) 98 F (36.7 C)  TempSrc: Oral  Oral Oral  SpO2: 94%  96% 95%  Weight:      Height:       Neurology awake and alert, deconditioned  ENT with mild pallor Cardiovascular with S1 and S2 present and regular with no gallops or rubs, positive systolic murmur at the apex and right lower sternal border No JVD No lower extremity edema Respirator with poor inspiratory effort and mild rales at bases with no wheezing or rhonchi  Abdomen with no distention  Data Reviewed:   Family Communication: no family at the bedside   Disposition: Status is: Inpatient Remains inpatient appropriate because: pending  transfer to SNF   Planned Discharge Destination: Skilled nursing facility    Author: Elidia Toribio Furnace, MD 09/16/2023 4:40 PM  For on call review [email or web address].

## 2023-09-16 NOTE — Assessment & Plan Note (Addendum)
 Echocardiogram with preserved LV systolic function EF 60 to 65%, RV systolic function preserved, LA with mild dilatation, mild to moderate mitral regurgitation, mild to moderate tricuspid valve regurgitation, no aortic valve stenosis.   Urine output is documented at 500 cc, since admission negative fluid balance at -5,336 ml.  Systolic blood pressure 100 mmHg range.   Plan to continue diuresis with furosemide  40 mg po daily.  Continue carvedilol .  Limited guideline directed medical therapy due patient frailty and risk of hypotension.

## 2023-09-16 NOTE — Plan of Care (Signed)

## 2023-09-16 NOTE — Assessment & Plan Note (Signed)
 Continue clopidogrel  and statin Blood pressure control.

## 2023-09-16 NOTE — Assessment & Plan Note (Signed)
 No sing of acute flare. Noted hand joint deformities.

## 2023-09-16 NOTE — TOC Progression Note (Addendum)
 Transition of Care [facility]) - Progression Note    Patient Details  Name: [name] MRN: [id number] Date of Birth: May 10, 1927  [facility] CM/SW Contact  [name], [place] Phone Number: 09/16/2023, 9:33 AM  Clinical Narrative:   Per daily meeting with treatment team, patient is not medically ready. Facility notified.   TOC will continue to follow.    Expected Discharge Plan: Skilled Nursing Facility Barriers to Discharge: Continued Medical Work up               Expected Discharge Plan and Services In-house Referral: Clinical Social Work Discharge Planning Services: CM Consult Post Acute Care Choice: Home Health Living arrangements for the past 2 months: Single Family Home                 DME Arranged: N/A DME Agency: NA       HH Arranged: PT, OT, RN HH Agency: [facility] Date HH Agency Contacted: 09/09/23 Time HH Agency Contacted: 0900 Representative spoke with at [facility] Agency: [facility]   Social Drivers of Health (SDOH) Interventions SDOH Screenings   Food Insecurity: No Food Insecurity (09/07/2023)  Housing: Low Risk  (09/07/2023)  Transportation Needs: No Transportation Needs (09/07/2023)  Utilities: Not At Risk (09/07/2023)  Alcohol Screen: Low Risk  (11/14/2020)  Depression (PHQ2-9): Low Risk  (03/27/2022)  Financial Resource Strain: Low Risk  (03/27/2022)  Physical Activity: Inactive (03/27/2022)  Social Connections: Moderately Isolated (09/07/2023)  Stress: No Stress Concern Present (03/27/2022)  Tobacco Use: Low Risk  (09/07/2023)    Readmission Risk Interventions    09/09/2023    5:05 PM 08/08/2023   12:05 PM 05/14/2023    4:08 PM  Readmission Risk Prevention Plan  Transportation Screening Complete Complete Complete  Medication Review (RN Care Manager) Complete Complete Referral to Pharmacy  PCP or Specialist appointment within 3-5 days of discharge   Complete  HRI or Home Care Consult Complete Complete Complete  SW  Recovery Care/Counseling Consult  Complete Complete  Palliative Care Screening Not Applicable Not Applicable Not Applicable  Skilled Nursing Facility Not Applicable Complete Not Applicable

## 2023-09-16 NOTE — Hospital Course (Signed)
 89 y.o. female with a PMH significant for chronically ill, wheelchair-bound with history of CVA, diabetes, dislocated right shoulder, CKD 4, CABG, PAD, gout, rheumatoid arthritis presented to the ED with shortness of breath and swelling.  Of note, pt had recent hospitalization 6/19-25 with encephalopathy which was felt to be multifactorial related to polypharmacy UTI etc.  Subsequently after discharge home noticed worsening edema in lower legs along with progressive dyspnea on exertion which eventually led to, this ER presentation and admission.  In ED BNP 803 and chest x-ray concerning for CHF and basilar collapse/atelectasis.  [facility] hospitalist called for admission and started on diuretics for treatment for acute CHF exacerbation.

## 2023-09-16 NOTE — Assessment & Plan Note (Addendum)
 Acute left hand gouty attack Will start oral prednisone  20 mg po daily for 3 days.  Follow up response No Nonsteroidal antiinflammatory agents due to reduced GFR.

## 2023-09-17 DIAGNOSIS — I5033 Acute on chronic diastolic (congestive) heart failure: Secondary | ICD-10-CM | POA: Diagnosis not present

## 2023-09-17 LAB — BASIC METABOLIC PANEL WITH GFR
Anion gap: 11 (ref 5–15)
BUN: 80 mg/dL — ABNORMAL HIGH (ref 8–23)
CO2: 22 mmol/L (ref 22–32)
Calcium: 8.7 mg/dL — ABNORMAL LOW (ref 8.9–10.3)
Chloride: 100 mmol/L (ref 98–111)
Creatinine, Ser: 2.55 mg/dL — ABNORMAL HIGH (ref 0.44–1.00)
GFR, Estimated: 17 mL/min — ABNORMAL LOW (ref >60)
Glucose, Bld: 128 mg/dL — ABNORMAL HIGH (ref 70–99)
Potassium: 5 mmol/L (ref 3.5–5.1)
Sodium: 133 mmol/L — ABNORMAL LOW (ref 135–145)

## 2023-09-17 LAB — GLUCOSE, CAPILLARY
Glucose-Capillary: 128 mg/dL — ABNORMAL HIGH (ref 70–99)
Glucose-Capillary: 143 mg/dL — ABNORMAL HIGH (ref 70–99)
Glucose-Capillary: 188 mg/dL — ABNORMAL HIGH (ref 70–99)
Glucose-Capillary: 165 mg/dL — ABNORMAL HIGH (ref 70–99)

## 2023-09-17 MED ORDER — ALLOPURINOL 100 MG PO TABS
100.0000 mg | ORAL_TABLET | Freq: Every day | ORAL | Status: DC
  Administered 2023-09-17 – 2023-09-18 (×2): 100 mg via ORAL
  Filled 2023-09-17 (×2): qty 1

## 2023-09-17 MED ORDER — CEFAZOLIN SODIUM-DEXTROSE 1-4 GM/50ML-% IV SOLN
1.0000 g | Freq: Two times a day (BID) | INTRAVENOUS | Status: DC
  Administered 2023-09-17 – 2023-09-18 (×2): 1 g via INTRAVENOUS
  Filled 2023-09-17 (×2): qty 50

## 2023-09-17 NOTE — Progress Notes (Signed)
 PROGRESS NOTE    [name]  FMW:[id number] DOB: [birth year] DOA: 09/07/2023 PCP: Ileen [name], NP   Brief Narrative:  89 y.o. female with a PMH significant for chronically ill, wheelchair-bound with history of CVA, diabetes, dislocated right shoulder, CKD 4, CABG, PAD, gout, rheumatoid arthritis presented to the ED with shortness of breath and swelling.  Of note, pt had recent hospitalization 6/19-25 with encephalopathy which was felt to be multifactorial related to polypharmacy UTI etc.  Subsequently after discharge home noticed worsening edema in lower legs along with progressive dyspnea on exertion which eventually led to, this ER presentation and admission.  In ED BNP 803 and chest x-ray concerning for CHF and basilar collapse/atelectasis.  [facility] hospitalist called for admission and started on diuretics for treatment for acute CHF exacerbation.      Assessment & Plan:  Principal Problem:   Acute on chronic diastolic CHF (congestive heart failure) (HCC) Active Problems:   History of CAD (coronary artery disease) of artery bypass graft   Essential hypertension   CKD (chronic kidney disease), stage IV (HCC)   Rheumatoid arthritis (HCC)   History of CVA (cerebrovascular accident)   Type 2 diabetes mellitus with hyperlipidemia (HCC)   Gout    Acute on chronic diastolic CHF (congestive heart failure) (HCC) Echocardiogram with preserved LV systolic function EF 60 to 65%, RV systolic function preserved, LA with mild dilatation, mild to moderate mitral regurgitation, mild to moderate tricuspid valve regurgitation, no aortic valve stenosis.   Plan to continue diuresis with furosemide  40 mg po daily.  Continue carvedilol .  Limited guideline directed medical therapy due patient frailty and risk of hypotension.    History of CAD (coronary artery disease) of artery bypass graft Continue clopidogrel  and statin Blood pressure control.    Essential hypertension Continue  blood pressure control with amlodipine .  Continue blood pressure monitoring    CKD (chronic kidney disease), stage IV  Continue diuresis with furosemide .  Follow up renal function and electrolytes in am.    Rheumatoid arthritis  No sing of acute flare. Noted hand joint deformities.    History of CVA (cerebrovascular accident) Peripheral vascular disease.  Continue blood pressure control, statin and clopidogrel .      Type 2 diabetes mellitus: Continue glucose cover and monitoring with insulin  sliding scale.   Hyperlipidemia: Continue with statin.    Acute gout flare of the left hand: improving Acute left hand gouty attack Continue oral prednisone  20 mg po daily for 3 days.  No Nonsteroidal antiinflammatory agents due to reduced GFR.  Started on allopurinol  100 mg daily  DVT prophylaxis: heparin  injection 5,000 Units Start: 09/07/23 1600     Code Status: Do not attempt resuscitation (DNR) PRE-ARREST INTERVENTIONS DESIRED Family Communication:   Status is: Inpatient Remains inpatient appropriate because: pending SNF    Subjective:  She feels better and having breakfast. Left hand is still swollen but pain has improved.   Examination:  General exam: Appears calm and comfortable  Respiratory system: Clear to auscultation. Respiratory effort normal. Cardiovascular system: S1 & S2 heard, RRR. No JVD, murmurs, rubs, gallops or clicks. No pedal edema. Gastrointestinal system: Abdomen is nondistended, soft and nontender. No organomegaly or masses felt. Normal bowel sounds heard. Central nervous system: Alert and oriented. No focal neurological deficits. Extremities: Symmetric 5 x 5 power. Left hand swelling and mild tenderness Skin: No rashes, lesions or ulcers Psychiatry: Judgement and insight appear normal. Mood & affect appropriate.       Diet Orders (From  admission, onward)     Start     Ordered   09/07/23 1555  Diet Heart Room service appropriate? Yes; Fluid  consistency: Thin; Fluid restriction: 1800 mL Fluid  Diet effective now       Question Answer Comment  Room service appropriate? Yes   Fluid consistency: Thin   Fluid restriction: 1800 mL Fluid      09/07/23 1557            Objective: Vitals:   09/17/23 0400 09/17/23 0737 09/17/23 0829 09/17/23 1130  BP: (!) 159/53 (!) 160/85 (!) 160/85 (!) 155/55  Pulse: 80   69  Resp: 18 17  18   Temp: 100 F (37.8 C) 98.8 F (37.1 C)  98 F (36.7 C)  TempSrc: Oral Oral  Oral  SpO2: 95% 94%  100%  Weight: 57.6 kg     Height:        Intake/Output Summary (Last 24 hours) at 09/17/2023 1332 Last data filed at 09/17/2023 0935 Gross per 24 hour  Intake 245.14 ml  Output 250 ml  Net -4.86 ml   Filed Weights   09/15/23 0423 09/16/23 0417 09/17/23 0400  Weight: 61 kg 63.2 kg 57.6 kg    Scheduled Meds:  amLODipine   10 mg Oral Daily   carvedilol   6.25 mg Oral BID WC   clopidogrel   75 mg Oral Daily   furosemide   40 mg Oral Daily   heparin   5,000 Units Subcutaneous Q8H   hydroxychloroquine   200 mg Oral Daily   insulin  aspart  0-9 Units Subcutaneous TID WC   melatonin  3 mg Oral QHS   pantoprazole   40 mg Oral Daily   predniSONE   20 mg Oral Q breakfast   rosuvastatin   10 mg Oral QPM   sodium chloride  flush  3 mL Intravenous Q12H   Continuous Infusions:   ceFAZolin  (ANCEF ) IV 1 g (09/17/23 0836)    Nutritional status     Body mass index is 21.79 kg/m.  Data Reviewed:   CBC: Recent Labs  Lab 09/12/23 0359 09/13/23 0253  WBC 5.1 5.5  HGB 7.1* 7.4*  HCT 22.1* 22.7*  MCV 95.7 95.0  PLT 200 211   Basic Metabolic Panel: Recent Labs  Lab 09/11/23 0251 09/12/23 0359 09/13/23 0253 09/15/23 1315 09/17/23 0233  NA 138 137 136 135 133*  K 4.6 4.7 4.5 4.6 5.0  CL 105 106 104 102 100  CO2 21* 22 21* 21* 22  GLUCOSE 79 77 88 176* 128*  BUN 83* 88* 92* 90* 80*  CREATININE 2.53* 2.88* 2.83* 2.66* 2.55*  CALCIUM  8.9 8.8* 8.8* 9.3 8.7*   GFR: Estimated Creatinine  Clearance: 11.6 mL/min (A) (by C-G formula based on SCr of 2.55 mg/dL (H)). Liver Function Tests: No results for input(s): AST, ALT, ALKPHOS, BILITOT, PROT, ALBUMIN in the last 168 hours. No results for input(s): LIPASE, AMYLASE in the last 168 hours. No results for input(s): AMMONIA in the last 168 hours. Coagulation Profile: No results for input(s): INR, PROTIME in the last 168 hours. Cardiac Enzymes: No results for input(s): CKTOTAL, CKMB, CKMBINDEX, TROPONINI in the last 168 hours. BNP (last 3 results) No results for input(s): PROBNP in the last 8760 hours. HbA1C: No results for input(s): HGBA1C in the last 72 hours. CBG: Recent Labs  Lab 09/16/23 1134 09/16/23 1534 09/16/23 2117 09/17/23 0605 09/17/23 1128  GLUCAP 131* 102* 172* 128* 143*   Lipid Profile: No results for input(s): CHOL, HDL, LDLCALC, TRIG, CHOLHDL, LDLDIRECT in the  last 72 hours. Thyroid  Function Tests: No results for input(s): TSH, T4TOTAL, FREET4, T3FREE, THYROIDAB in the last 72 hours. Anemia Panel: No results for input(s): VITAMINB12, FOLATE, FERRITIN, TIBC, IRON , RETICCTPCT in the last 72 hours. Sepsis Labs: No results for input(s): PROCALCITON, LATICACIDVEN in the last 168 hours.  Recent Results (from the past 240 hours)  Resp panel by RT-PCR (RSV, Flu A&B, Covid) Anterior Nasal Swab     Status: None   Collection Time: 09/07/23  4:05 PM   Specimen: Anterior Nasal Swab  Result Value Ref Range Status   SARS Coronavirus 2 by RT PCR NEGATIVE NEGATIVE Final   Influenza A by PCR NEGATIVE NEGATIVE Final   Influenza B by PCR NEGATIVE NEGATIVE Final    Comment: (NOTE) The Xpert Xpress SARS-CoV-2/FLU/RSV plus assay is intended as an aid in the diagnosis of influenza from Nasopharyngeal swab specimens and should not be used as a sole basis for treatment. Nasal washings and aspirates are unacceptable for Xpert Xpress  SARS-CoV-2/FLU/RSV testing.  Fact Sheet for Patients: BloggerCourse.com  Fact Sheet for Healthcare Providers: SeriousBroker.it  This test is not yet approved or cleared by the [place]  FDA and has been authorized for detection and/or diagnosis of SARS-CoV-2 by FDA under an Emergency Use Authorization (EUA). This EUA will remain in effect (meaning this test can be used) for the duration of the COVID-19 declaration under Section 564(b)(1) of the Act, 21 U.S.C. section 360bbb-3(b)(1), unless the authorization is terminated or revoked.     Resp Syncytial Virus by PCR NEGATIVE NEGATIVE Final    Comment: (NOTE) Fact Sheet for Patients: BloggerCourse.com  Fact Sheet for Healthcare Providers: SeriousBroker.it  This test is not yet approved or cleared by the [place]  FDA and has been authorized for detection and/or diagnosis of SARS-CoV-2 by FDA under an Emergency Use Authorization (EUA). This EUA will remain in effect (meaning this test can be used) for the duration of the COVID-19 declaration under Section 564(b)(1) of the Act, 21 U.S.C. section 360bbb-3(b)(1), unless the authorization is terminated or revoked.  Performed at [facility], [address] [place], [place] [address]   Urine Culture     Status: Abnormal   Collection Time: 09/13/23 12:00 AM   Specimen: Urine, Random  Result Value Ref Range Status   Specimen Description URINE, RANDOM  Final   Special Requests   Final    NONE Reflexed from [id number] Performed at [facility], [address], [place], [place] [address]    Culture >=100,000 COLONIES/mL KLEBSIELLA PNEUMONIAE (A)  Final   Report Status 09/15/2023 FINAL  Final   Organism ID, Bacteria KLEBSIELLA PNEUMONIAE (A)  Final      Susceptibility   Klebsiella pneumoniae - MIC*    AMPICILLIN RESISTANT Resistant     CEFAZOLIN  <=4 SENSITIVE  Sensitive     CEFEPIME <=0.12 SENSITIVE Sensitive     CEFTRIAXONE  <=0.25 SENSITIVE Sensitive     CIPROFLOXACIN <=0.25 SENSITIVE Sensitive     GENTAMICIN <=1 SENSITIVE Sensitive     IMIPENEM <=0.25 SENSITIVE Sensitive     NITROFURANTOIN 64 INTERMEDIATE Intermediate     TRIMETH/SULFA <=20 SENSITIVE Sensitive     AMPICILLIN/SULBACTAM <=2 SENSITIVE Sensitive     PIP/TAZO <=4 SENSITIVE Sensitive ug/mL    * >=100,000 COLONIES/mL KLEBSIELLA PNEUMONIAE         Radiology Studies: No results found.     LOS: 9 days   Time spent= 42 mins    Deliliah Room, MD [facility]  If 7PM-7AM, please contact night-coverage  09/17/2023, 1:32 PM

## 2023-09-17 NOTE — Progress Notes (Signed)
 PHARMACY NOTE:  ANTIMICROBIAL RENAL DOSAGE ADJUSTMENT  Current antimicrobial regimen includes a mismatch between antimicrobial dosage and estimated renal function.  As per policy approved by the Pharmacy & Therapeutics and Medical Executive Committees, the antimicrobial dosage will be adjusted accordingly.  Current antimicrobial dosage:  Cefazolin  1g q24h  Indication: Klebsiella Pneumoniae UTI  Renal Function:  Estimated Creatinine Clearance: 11.6 mL/min (A) (by C-G formula based on SCr of 2.55 mg/dL (H)). []      On intermittent HD, scheduled: []      On CRRT    Antimicrobial dosage has been changed to:  Cefazolin  1g q12h  Additional comments: Patient is not ESRD and is not on any kind of renal replacement therapy.  Thank you for allowing pharmacy to be involved with this patient's care.  Mendel Barter, PharmD PGY1 Clinical Pharmacist [facility]  09/17/2023 3:03 PM

## 2023-09-17 NOTE — TOC Progression Note (Addendum)
 Transition of Care [facility]) - Progression Note    Patient Details  Name: [name] MRN: [id number] Date of Birth: 1927-10-15  Transition of Care [facility]) CM/SW Contact  [name], [place] Phone Number: 09/17/2023, 11:35 AM  Clinical Narrative:   Per MD, patient is medically stable for discharge today. Per MD, pts daughter [name]) does not want patient to go to [facility]. CSW went to speak with patient about SNF and her son ([name]) was at bedside. [name] asked if patient could discharge to [facility] instead, CSW reached out to [place] with admissions at [facility] to inquire about bed availability. [name] stated she will reach back out to CSW about bed availability. CSW conveyed message to patient and her son GLENWOOD Gore expressed to patient that maybe she could try [facility] and see if she likes it. CSW informed family that they can request a transfer to another facility if patient does not want to stay at [facility]. Patient inquired about going to [place] or [place], CSW informed patient that they have declined at this time. Patient inquired about going home. CSW reviewed PT note with patient and informed her that the hospital wants to ensure a safe discharge plan for her. Patient expressed her understanding. [name] encouraged patient to try STR at a SNF before discharging home. CSW asked permission to call pts dtr, [name], to provide an update to her as well. [name] attempted to call and her phone went to voicemail. CSW attempted to call and left a voicemail for [employer].   12:00PM CSW spoke with Houston and she stated she does not want patient discharging to [facility] at this time. She would like CSW to check in with [facility] and check about copay as patient is in copay days with insurance. CSW informed family that [facility] is a billing facility and do not require upfront payment. Family would still like to know what the copay is.   12:10PM [facility] spoke with [facility]  and they are not able to offer a bed to patient at this time. CSW informed Houston and she would like CSW to check with [facility]. CSW inquired about copay, [facility]'s benefits checking system is currently down but [facility] is also a billing facility and is willing to work with family to arrange payment of copays. CSW inquired about bed availability at [facility], awaiting a response. CSW notified [place].   1:37 PM Per Bedside RN, family is interested in CIR. CSW notified CIR.   2:58 PM Per CIR, patient is not a candidate for inpatient rehab at this time.   CSW spoke with Houston and notified her that [facility] can accept patient tomorrow. She stated okay. CSW notified Houston that facility will review patients copay with family as they cannot see it at this time. CSW explained that once patient reaches her out of pocket max with insurance then [facility] stated she will not have to worry about a copay. Houston expressed her understanding verbally and is agreeable to current plan for discharge to [facility] tomorrow.   CSW contacted Navi with [employer] and switched patients insurance authorization.  CSW left a VM for [name] to notify him that inpatient rehab has determined patient is not a good candidate for them.   CSW notified MD and bedside RN.   TOC will continue to follow.    Expected Discharge Plan: Skilled Nursing Facility Barriers to Discharge: Continued Medical Work up               Expected Discharge Plan  and Services In-house Referral: Clinical Social Work Discharge Planning Services: CM Consult Post Acute Care Choice: Home Health Living arrangements for the past 2 months: Single Family Home                 DME Arranged: N/A DME Agency: NA       HH Arranged: PT, OT, RN HH Agency: [facility] Date HH Agency Contacted: 09/09/23 Time HH Agency Contacted: 0900 Representative spoke with at [facility] Agency: [place]   Social Drivers of Health (SDOH) Interventions SDOH Screenings   Food Insecurity:  No Food Insecurity (09/07/2023)  Housing: Low Risk  (09/07/2023)  Transportation Needs: No Transportation Needs (09/07/2023)  Utilities: Not At Risk (09/07/2023)  Alcohol Screen: Low Risk  (11/14/2020)  Depression (PHQ2-9): Low Risk  (03/27/2022)  Financial Resource Strain: Low Risk  (03/27/2022)  Physical Activity: Inactive (03/27/2022)  Social Connections: Moderately Isolated (09/07/2023)  Stress: No Stress Concern Present (03/27/2022)  Tobacco Use: Low Risk  (09/07/2023)    Readmission Risk Interventions    09/09/2023    5:05 PM 08/08/2023   12:05 PM 05/14/2023    4:08 PM  Readmission Risk Prevention Plan  Transportation Screening Complete Complete Complete  Medication Review (RN Care Manager) Complete Complete Referral to Pharmacy  PCP or Specialist appointment within 3-5 days of discharge   Complete  HRI or Home Care Consult Complete Complete Complete  SW Recovery Care/Counseling Consult  Complete Complete  Palliative Care Screening Not Applicable Not Applicable Not Applicable  Skilled Nursing Facility Not Applicable Complete Not Applicable

## 2023-09-17 NOTE — Progress Notes (Signed)
 Inpatient Rehab Admissions Coordinator:   Per family request pt was screened for CIR candidacy by Reche Lowers, PT, DPT.  Admitted to [facility] with acute CHF exacerbation.  She is currently requiring max assist for transfers and ADLs.  Her chronic medical conditions are stable.  Therapy recommendations are for SNF.  Based on my assessment I do not believe that her [facility] Medicare would approve CIR admission based on decreased tolerance for therapy, and lack of medical necessity to support hospital rehab admission.  I notified TOC.  No consult at this time.   Reche Lowers, PT, DPT Admissions Coordinator [phone] 09/17/23  2:53 PM

## 2023-09-18 DIAGNOSIS — I5033 Acute on chronic diastolic (congestive) heart failure: Secondary | ICD-10-CM | POA: Diagnosis not present

## 2023-09-18 LAB — GLUCOSE, CAPILLARY
Glucose-Capillary: 115 mg/dL — ABNORMAL HIGH (ref 70–99)
Glucose-Capillary: 195 mg/dL — ABNORMAL HIGH (ref 70–99)

## 2023-09-18 MED ORDER — ALLOPURINOL 100 MG PO TABS: 100.0000 mg | ORAL_TABLET | Freq: Every day | ORAL | Status: AC

## 2023-09-18 MED ORDER — DICLOFENAC SODIUM 1 % EX GEL: 2.0000 g | Freq: Three times a day (TID) | CUTANEOUS | Status: AC | PRN

## 2023-09-18 MED ORDER — FUROSEMIDE 40 MG PO TABS: 40.0000 mg | ORAL_TABLET | Freq: Every day | ORAL | Status: AC

## 2023-09-18 MED ORDER — CEFDINIR 300 MG PO CAPS: 300.0000 mg | ORAL_CAPSULE | Freq: Two times a day (BID) | ORAL | 0 refills | Status: AC

## 2023-09-18 NOTE — Discharge Summary (Signed)
 Physician Discharge Summary   Patient: [name] MRN: [id number] DOB: 03-Oct-1927  Admit date:     09/07/2023  Discharge date: 09/18/23  Discharge Physician: Deliliah Room   PCP: Ileen [name], NP   Recommendations at discharge:    Follow up with your PCP in one week. Continue meds as prescribed  Discharge Diagnoses: Principal Problem:   Acute on chronic diastolic CHF (congestive heart failure) (HCC) Active Problems:   History of CAD (coronary artery disease) of artery bypass graft   Essential hypertension   CKD (chronic kidney disease), stage IV (HCC)   Rheumatoid arthritis (HCC)   History of CVA (cerebrovascular accident)   Type 2 diabetes mellitus with hyperlipidemia (HCC)   Gout   Hospital Course:  Acute on chronic diastolic CHF (congestive heart failure) (HCC) Echocardiogram with preserved LV systolic function EF 60 to 65%, RV systolic function preserved, LA with mild dilatation, mild to moderate mitral regurgitation, mild to moderate tricuspid valve regurgitation, no aortic valve stenosis.   Plan to continue diuresis with furosemide  40 mg po daily.  Continue carvedilol .  Limited guideline directed medical therapy due patient frailty and risk of hypotension.   Acute uncomplicated UTI secondary to Klebsiella: On oral cefdinir on discharge.   History of CAD (coronary artery disease) of artery bypass graft Continue clopidogrel  and statin Blood pressure control.    Essential hypertension Continue blood pressure control with amlodipine .  Continue blood pressure monitoring    CKD (chronic kidney disease), stage IV  Continue diuresis with furosemide .  Follow up renal function and electrolytes in am.    Rheumatoid arthritis  No sing of acute flare. Noted hand joint deformities.    History of CVA (cerebrovascular accident) Peripheral vascular disease.  Continue blood pressure control, statin and clopidogrel .      Type 2 diabetes  mellitus: Continue with jardiance    Hyperlipidemia: Continue with statin.    Acute gout flare of the left hand: improving Acute left hand gouty attack Continue oral prednisone  20 mg po daily for 3 days.  No Nonsteroidal antiinflammatory agents due to reduced GFR.  Started on allopurinol  100 mg daily       Consultants: none Procedures performed: None  Disposition: Skilled nursing facility Diet recommendation:  Cardiac diet DISCHARGE MEDICATION: Allergies as of 09/18/2023       Reactions   Cyclobenzaprine  Other (See Comments)   Reaction not recalled by patient   Tramadol  Other (See Comments)   Hallucinations        Medication List     STOP taking these medications    cetirizine 10 MG tablet Commonly known as: ZYRTEC       TAKE these medications    Accu-Chek Aviva Plus w/Device Kit Use to check  Blood sugars up to 3 times a day.  Dx code e11.65   Accu-Chek Aviva Soln Use with accu-check aviva machine   Accu-Chek Softclix Lancets lancets Use as instructed to check blood sugars up to 3 times a day  Dx code e11.65   acetaminophen  325 MG tablet Commonly known as: TYLENOL  Take 2 tablets (650 mg total) by mouth every 6 (six) hours as needed for mild pain (pain score 1-3), moderate pain (pain score 4-6), fever or headache (or temp > 37.5 C (99.5 F)). What changed: reasons to take this   allopurinol  100 MG tablet Commonly known as: ZYLOPRIM  Take 1 tablet (100 mg total) by mouth daily.   amLODipine  10 MG tablet Commonly known as: NORVASC  Take 1 tablet (10 mg  total) by mouth daily.   carvedilol  6.25 MG tablet Commonly known as: Coreg  Take 1 tablet (6.25 mg total) by mouth 2 (two) times daily.   cefdinir 300 MG capsule Commonly known as: OMNICEF Take 1 capsule (300 mg total) by mouth 2 (two) times daily.   clopidogrel  75 MG tablet Commonly known as: PLAVIX  Take 1 tablet (75 mg total) by mouth daily.   diclofenac  Sodium 1 % Gel Commonly known as:  VOLTAREN  Apply 2 g topically 3 (three) times daily as needed (joint pain).   furosemide  40 MG tablet Commonly known as: LASIX  Take 1 tablet (40 mg total) by mouth daily.   gabapentin  300 MG capsule Commonly known as: NEURONTIN  Take 300 mg by mouth 3 (three) times daily.   glucose blood test strip Use as instructed to check blood sugars up to 3 times a day.  Dx code e11.65   hydroxychloroquine  200 MG tablet Commonly known as: PLAQUENIL  Take 200 mg by mouth daily.   Jardiance  10 MG Tabs tablet Generic drug: empagliflozin  Take 10 mg by mouth daily.   lidocaine  5 % Commonly known as: LIDODERM  Place 1 patch onto the skin every 12 (twelve) hours as needed (pain).   loratadine  10 MG tablet Commonly known as: CLARITIN  Take 10 mg by mouth daily as needed for allergies or rhinitis.   melatonin 3 MG Tabs tablet Take 3 mg by mouth at bedtime.   nitroGLYCERIN  0.4 MG SL tablet Commonly known as: NITROSTAT  PLACE 1 TABLET UNDER THE TONGUE AT THE FIRST SIGN OF ATTACK, MAY REPEAT EVERY 5 MINUTES FOR 3 DOSES IN 15 MINUTES. IF PAIN PERSISTS CALL 911 What changed:  how much to take how to take this when to take this reasons to take this additional instructions   ondansetron  4 MG disintegrating tablet Commonly known as: ZOFRAN -ODT Take 4 mg by mouth every 8 (eight) hours as needed.   pantoprazole  40 MG tablet Commonly known as: PROTONIX  Take 1 tablet (40 mg total) by mouth daily.   rosuvastatin  10 MG tablet Commonly known as: CRESTOR  Take 1 tablet (10 mg total) by mouth daily.   Vitamin D3 50 MCG (2000 UT) Tabs Take 2,000 Units by mouth daily.        Contact information for follow-up providers     Schmerge, [name], NP. Call in 1 week(s).   Specialty: Nurse Practitioner Contact information: [address] [place] [address] [phone]              Contact information for after-discharge care     Destination     [facility] .   Service: Skilled Nursing Contact information: [address] [place]  [address] [phone]                    Discharge Exam: Filed Weights   09/16/23 0417 09/17/23 0400 09/18/23 0437  Weight: 63.2 kg 57.6 kg 56.1 kg   General exam: Appears calm and comfortable  Respiratory system: Clear to auscultation. Respiratory effort normal. Cardiovascular system: S1 & S2 heard, RRR. No JVD, murmurs, rubs, gallops or clicks. No pedal edema. Gastrointestinal system: Abdomen is nondistended, soft and nontender. No organomegaly or masses felt. Normal bowel sounds heard. Central nervous system: Alert and oriented. No focal neurological deficits. Extremities: Symmetric 5 x 5 power. Left hand swelling and mild tenderness Skin: No rashes, lesions or ulcers Psychiatry: Judgement and insight appear normal. Mood & affect appropriate.  Condition at discharge: good  The results  of significant diagnostics from this hospitalization (including imaging, microbiology, ancillary and laboratory) are listed below for reference.   Imaging Studies: VAS US  LOWER EXTREMITY VENOUS (DVT) Result Date: 09/16/2023  Lower Venous DVT Study Patient Name:  Catrina LORIN HAUCK  Date of Exam:   09/15/2023 Medical Rec #: [id number]                  Accession #:    [id number] Date of Birth: 1927/06/15                  Patient Gender: F Patient Age:   49 years Exam [facility]  [facility] Procedure:      VAS US  LOWER EXTREMITY VENOUS (DVT) Referring Phys: JEFFREY WALDEN --------------------------------------------------------------------------------  Indications: Swelling, and Edema.  Performing Technologist: Elmarie Lindau, RVT  Examination Guidelines: A complete evaluation includes B-mode imaging, spectral Doppler, color Doppler, and power Doppler as needed of all accessible portions of each vessel. Bilateral testing is considered an integral part of a complete examination. Limited  examinations for reoccurring indications may be performed as noted. The reflux portion of the exam is performed with the patient in reverse Trendelenburg.  +---------+---------------+---------+-----------+----------+--------------+ RIGHT    CompressibilityPhasicitySpontaneityPropertiesThrombus Aging +---------+---------------+---------+-----------+----------+--------------+ CFV      Full           Yes      Yes                                 +---------+---------------+---------+-----------+----------+--------------+ SFJ      Full                                                        +---------+---------------+---------+-----------+----------+--------------+ FV Prox  Full                                                        +---------+---------------+---------+-----------+----------+--------------+ FV Mid   Full                                                        +---------+---------------+---------+-----------+----------+--------------+ FV DistalFull                                                        +---------+---------------+---------+-----------+----------+--------------+ PFV      Full                                                        +---------+---------------+---------+-----------+----------+--------------+ POP      Full           Yes      Yes                                 +---------+---------------+---------+-----------+----------+--------------+  PTV      Full                                                        +---------+---------------+---------+-----------+----------+--------------+ PERO     Full                                                        +---------+---------------+---------+-----------+----------+--------------+   +----+---------------+---------+-----------+----------+--------------+ LEFTCompressibilityPhasicitySpontaneityPropertiesThrombus Aging  +----+---------------+---------+-----------+----------+--------------+ CFV Full           Yes      Yes                                 +----+---------------+---------+-----------+----------+--------------+     Summary: RIGHT: - There is no evidence of deep vein thrombosis in the lower extremity.  - No cystic structure found in the popliteal fossa.  LEFT: - No evidence of common femoral vein obstruction.   *See table(s) above for measurements and observations. Electronically signed by Norman Serve on 09/16/2023 at 10:07:48 AM.    Final    MR WRIST LEFT WO CONTRAST Result Date: 09/13/2023 CLINICAL DATA:  Joint effusion, joint swelling, erythema EXAM: MR OF THE LEFT WRIST WITHOUT CONTRAST TECHNIQUE: Multiplanar, multisequence MR imaging of the left wrist was performed. No intravenous contrast was administered. COMPARISON:  None Available. FINDINGS: Despite efforts by the technologist and patient, motion artifact is present on today's exam and could not be eliminated. This reduces exam sensitivity and specificity. Ligaments: No definite tear along the extrinsic interosseous ligaments or dorsal or volar ligamentous structures, subject to the limitations of motion artifact. Triangular fibrocartilage: Substantial suspicion for type 1a tear of the TFCC disc at the radial attachment on image 15 of series 4 Tendons: Mild flexor tenosynovitis proximal to the carpal tunnel and distal to the carpal tunnel without substantial edema within the carpal tunnel itself. Carpal tunnel/median nerve: No expansion or edema of the median nerve. Guyon's canal: No impinging lesion identified. Joint/cartilage: There is potentially mild synovitis in the radiocarpal joint and midcarpal row without a large joint effusion. Small to effusion of the distal radioulnar joint. Mild chondral thinning the radiocarpal articulation. Bones/carpal alignment: Markedly severe arthropathy at the first carpometacarpal articulation. Moderate arthropathy  between the scaphoid and the trapezium. Scattered small geodes in the carpus. Other: Circumferential subcutaneous edema in the wrist tracks into the hand. Fluid tracks along the superficial fascia margin of the abductor digiti minimi for example on image 5 series 1. No clear drainable abscess. IMPRESSION: 1. Circumferential subcutaneous edema in the wrist tracks into the hand. Cellulitis not excluded. Fluid tracks along the superficial fascia margin of the abductor digiti minimi. No clear drainable abscess. 2. Mild synovitis in the radiocarpal joint and midcarpal row without a large joint effusion. Small to effusion of the distal radioulnar joint. 3. Mild flexor tenosynovitis proximal to the carpal tunnel and distal to the carpal tunnel without substantial edema within the carpal tunnel itself. 4. Markedly severe arthropathy at the first carpometacarpal articulation. Moderate arthropathy between the scaphoid and the trapezium. 5. Substantial suspicion for type 1a tear of the TFCC disc at  the radial attachment. Electronically Signed   By: Ryan Salvage M.D.   On: 09/13/2023 16:27   MR HAND LEFT WO CONTRAST Result Date: 09/13/2023 CLINICAL DATA:  Soft tissue infection EXAM: MRI OF THE LEFT HAND WITHOUT CONTRAST TECHNIQUE: Multiplanar, multisequence MR imaging of the left hand was performed. No intravenous contrast was administered. COMPARISON:  None Available. FINDINGS: Bones/Joint/Cartilage Severe osteoarthritis of the first carpometacarpal articulation with extensive flattening, spurring, a joint effusion. Mild subcortical marrow edema along the first carpometacarpal articulation. There is also arthropathy between the scaphoid and the trapezium and a small joint effusion between the scaphoid and the trapezoid. Scattered small geodes in the carpus. Ligaments Large field-of-images of the hand demonstrate no obvious ligamentous injury, with no direction to an individual joint in the hand. Muscles and Tendons  Minimal tenosynovitis of the flexor tendons of the index, middle, and small fingers. Soft tissues Prominent circumferential subcutaneous edema along the wrist tracking into the hand, fingers, and thumb. No drainable fluid collection is identified. IMPRESSION: 1. Prominent circumferential subcutaneous edema along the wrist tracking into the hand, fingers, and thumb. Cellulitis not excluded. No drainable fluid collection is identified. 2. Severe osteoarthritis of the first carpometacarpal articulation. 3. Minimal tenosynovitis of the flexor tendons of the index, middle, and small fingers. Electronically Signed   By: Ryan Salvage M.D.   On: 09/13/2023 16:21   VAS US  UPPER EXTREMITY VENOUS DUPLEX Result Date: 09/10/2023 UPPER VENOUS STUDY  Patient Name:  Miles EARLENE BJELLAND  Date of Exam:   09/09/2023 Medical Rec #: [id number]                  Accession #:    [id number] Date of Birth: 02-Mar-1927                  Patient Gender: F Patient Age:   53 years Exam Location:  [facility] Procedure:      VAS US  UPPER EXTREMITY VENOUS DUPLEX Referring Phys: MICAELA SUNDIL --------------------------------------------------------------------------------  Indications: Swelling Limitations: Pt is unable to position arm that is adequate for optimal imaging. Performing Technologist: Elmarie Lindau, RVT  Examination Guidelines: A complete evaluation includes B-mode imaging, spectral Doppler, color Doppler, and power Doppler as needed of all accessible portions of each vessel. Bilateral testing is considered an integral part of a complete examination. Limited examinations for reoccurring indications may be performed as noted.  Right Findings: +----------+------------+---------+-----------+----------+-----------------+ RIGHT     CompressiblePhasicitySpontaneousProperties     Summary      +----------+------------+---------+-----------+----------+-----------------+ Subclavian               Yes       Yes               normal color flow +----------+------------+---------+-----------+----------+-----------------+  Left Findings: +----------+------------+---------+-----------+----------+-------+ LEFT      CompressiblePhasicitySpontaneousPropertiesSummary +----------+------------+---------+-----------+----------+-------+ IJV           Full       Yes                                +----------+------------+---------+-----------+----------+-------+ Subclavian    Full       Yes                                +----------+------------+---------+-----------+----------+-------+ Axillary      Full       Yes                                +----------+------------+---------+-----------+----------+-------+  Brachial      Full                                          +----------+------------+---------+-----------+----------+-------+ Radial        Full                                          +----------+------------+---------+-----------+----------+-------+ Ulnar         Full                                          +----------+------------+---------+-----------+----------+-------+ Cephalic      Full                                          +----------+------------+---------+-----------+----------+-------+ Basilic       Full                                          +----------+------------+---------+-----------+----------+-------+  Summary:  Right: No evidence of thrombosis in the subclavian.  Left: No evidence of deep vein thrombosis in the upper extremity. No evidence of superficial vein thrombosis in the upper extremity.  *See table(s) above for measurements and observations.  Diagnosing physician: Lonni Gaskins MD Electronically signed by Lonni Gaskins MD on 09/10/2023 at 8:25:50 AM.    Final    ECHOCARDIOGRAM COMPLETE Result Date: 09/08/2023    ECHOCARDIOGRAM REPORT   Patient Name:   Saharra MURCHISON [facility] Date of Exam: 09/08/2023 Medical Rec #:  [id number]                  Height:       64.0 in Accession #:    [id number]                Weight:       144.6 lb Date of Birth:  [birth year]                 BSA:          1.705 m Patient Age:    89 years                  BP:           168/80 mmHg Patient Gender: F                         HR:           67 bpm. Exam Location:  Inpatient Procedure: 2D Echo, Color Doppler and Cardiac Doppler (Both Spectral and Color            Flow Doppler were utilized during procedure). Indications:    CHF  History:        Patient has prior history of Echocardiogram examinations, most                 recent 02/13/2023. CAD.  Sonographer:    Benard Stallion Referring Phys: [id number] MARSA B MELVIN IMPRESSIONS  1.  Left ventricular ejection fraction, by estimation, is 60 to 65%. The left ventricle has normal function. The left ventricle has no regional wall motion abnormalities. Left ventricular diastolic parameters are indeterminate.  2. Right ventricular systolic function is normal. The right ventricular size is normal. There is moderately elevated pulmonary artery systolic pressure.  3. Left atrial size was mildly dilated.  4. The mitral valve is degenerative. Mild to moderate mitral valve regurgitation. No evidence of mitral stenosis. Moderate mitral annular calcification.  5. Tricuspid valve regurgitation is mild to moderate.  6. The aortic valve is tricuspid. Aortic valve regurgitation is not visualized. Aortic valve sclerosis is present, with no evidence of aortic valve stenosis. Comparison(s): Prior images reviewed side by side. Tricuspid regurgitation has increased from prior study. FINDINGS  Left Ventricle: Left ventricular ejection fraction, by estimation, is 60 to 65%. The left ventricle has normal function. The left ventricle has no regional wall motion abnormalities. The left ventricular internal cavity size was normal in size. There is  no left ventricular hypertrophy. Left ventricular diastolic parameters are indeterminate. Right  Ventricle: The right ventricular size is normal. No increase in right ventricular wall thickness. Right ventricular systolic function is normal. There is moderately elevated pulmonary artery systolic pressure. The tricuspid regurgitant velocity is 3.33 m/s, and with an assumed right atrial pressure of 3 mmHg, the estimated right ventricular systolic pressure is 47.5 mmHg. Left Atrium: Left atrial size was mildly dilated. Right Atrium: Right atrial size was normal in size. Pericardium: There is no evidence of pericardial effusion. Mitral Valve: The mitral valve is degenerative in appearance. Moderate mitral annular calcification. Mild to moderate mitral valve regurgitation. No evidence of mitral valve stenosis. Tricuspid Valve: Difficult spectral Doppler of hepatic flow signal. The tricuspid valve is normal in structure. Tricuspid valve regurgitation is mild to moderate. Aortic Valve: The aortic valve is tricuspid. Aortic valve regurgitation is not visualized. Aortic valve sclerosis is present, with no evidence of aortic valve stenosis. Aortic valve mean gradient measures 4.0 mmHg. Aortic valve peak gradient measures 7.7  mmHg. Aortic valve area, by VTI measures 2.64 cm. Pulmonic Valve: The pulmonic valve was normal in structure. Pulmonic valve regurgitation is not visualized. No evidence of pulmonic stenosis. Aorta: The aortic root is normal in size and structure. IAS/Shunts: The atrial septum is grossly normal.  LEFT VENTRICLE PLAX 2D LVIDd:         4.90 cm   Diastology LVIDs:         3.10 cm   LV e' medial:    7.94 cm/s LV PW:         0.80 cm   LV E/e' medial:  18.5 LV IVS:        1.00 cm   LV e' lateral:   8.92 cm/s LVOT diam:     2.00 cm   LV E/e' lateral: 16.5 LV SV:         94 LV SV Index:   55 LVOT Area:     3.14 cm  RIGHT VENTRICLE RV Basal diam:  4.00 cm RV Mid diam:    3.50 cm RV S prime:     10.80 cm/s TAPSE (M-mode): 1.6 cm LEFT ATRIUM             Index        RIGHT ATRIUM           Index LA diam:         3.70 cm 2.17 cm/m   RA Area:  12.10 cm LA Vol (A2C):   61.2 ml 35.90 ml/m  RA Volume:   27.40 ml  16.07 ml/m LA Vol (A4C):   74.4 ml 43.65 ml/m LA Biplane Vol: 67.9 ml 39.83 ml/m  AORTIC VALVE AV Area (Vmax):    2.55 cm AV Area (Vmean):   2.53 cm AV Area (VTI):     2.64 cm AV Vmax:           139.00 cm/s AV Vmean:          93.450 cm/s AV VTI:            0.356 m AV Peak Grad:      7.7 mmHg AV Mean Grad:      4.0 mmHg LVOT Vmax:         113.00 cm/s LVOT Vmean:        75.400 cm/s LVOT VTI:          0.299 m LVOT/AV VTI ratio: 0.84  AORTA Ao Root diam: 2.30 cm MITRAL VALVE                TRICUSPID VALVE MV Area (PHT): 5.02 cm     TR Peak grad:   44.5 mmHg MV Decel Time: 151 msec     TR Vmax:        333.40 cm/s MV E velocity: 147.00 cm/s MV A velocity: 137.00 cm/s  SHUNTS MV E/A ratio:  1.07         Systemic VTI:  0.30 m                             Systemic Diam: 2.00 cm Stanly Leavens MD Electronically signed by Stanly Leavens MD Signature Date/Time: 09/08/2023/3:46:59 PM    Final    DG Chest 2 View Result Date: 09/07/2023 CLINICAL DATA:  Shortness of breath. EXAM: CHEST - 2 VIEW COMPARISON:  08/06/2023 and CT chest 05/12/2023. FINDINGS: Patient is slightly rotated. Trachea is midline. Heart is enlarged, as before. Thoracic aorta is calcified. Mid and lower lung zone predominant mixed interstitial and airspace opacification with moderate bilateral pleural effusions. Bibasilar collapse/consolidation. IMPRESSION: 1. Congestive heart failure. 2. Bibasilar collapse/consolidation likely due to atelectasis. Difficult to exclude pneumonia. Electronically Signed   By: Newell Eke M.D.   On: 09/07/2023 12:51    Microbiology: Results for orders placed or performed during the hospital encounter of 09/07/23  Resp panel by RT-PCR (RSV, Flu A&B, Covid) Anterior Nasal Swab     Status: None   Collection Time: 09/07/23  4:05 PM   Specimen: Anterior Nasal Swab  Result Value Ref Range Status   SARS  Coronavirus 2 by RT PCR NEGATIVE NEGATIVE Final   Influenza A by PCR NEGATIVE NEGATIVE Final   Influenza B by PCR NEGATIVE NEGATIVE Final    Comment: (NOTE) The Xpert Xpress SARS-CoV-2/FLU/RSV plus assay is intended as an aid in the diagnosis of influenza from Nasopharyngeal swab specimens and should not be used as a sole basis for treatment. Nasal washings and aspirates are unacceptable for Xpert Xpress SARS-CoV-2/FLU/RSV testing.  Fact Sheet for Patients: BloggerCourse.com  Fact Sheet for Healthcare Providers: SeriousBroker.it  This test is not yet approved or cleared by the [place]  FDA and has been authorized for detection and/or diagnosis of SARS-CoV-2 by FDA under an Emergency Use Authorization (EUA). This EUA will remain in effect (meaning this test can be used) for the duration of the COVID-19 declaration under Section 564(b)(1) of the  Act, 21 U.S.C. section 360bbb-3(b)(1), unless the authorization is terminated or revoked.     Resp Syncytial Virus by PCR NEGATIVE NEGATIVE Final    Comment: (NOTE) Fact Sheet for Patients: BloggerCourse.com  Fact Sheet for Healthcare Providers: SeriousBroker.it  This test is not yet approved or cleared by the [place]  FDA and has been authorized for detection and/or diagnosis of SARS-CoV-2 by FDA under an Emergency Use Authorization (EUA). This EUA will remain in effect (meaning this test can be used) for the duration of the COVID-19 declaration under Section 564(b)(1) of the Act, 21 U.S.C. section 360bbb-3(b)(1), unless the authorization is terminated or revoked.  Performed at [facility], [address] [place], [place] [address]   Urine Culture     Status: Abnormal   Collection Time: 09/13/23 12:00 AM   Specimen: Urine, Random  Result Value Ref Range Status   Specimen Description URINE, RANDOM  Final   Special  Requests   Final    NONE Reflexed from [id number] Performed at [facility], [address], [place], [place] [address]    Culture >=100,000 COLONIES/mL KLEBSIELLA PNEUMONIAE (A)  Final   Report Status 09/15/2023 FINAL  Final   Organism ID, Bacteria KLEBSIELLA PNEUMONIAE (A)  Final      Susceptibility   Klebsiella pneumoniae - MIC*    AMPICILLIN RESISTANT Resistant     CEFAZOLIN  <=4 SENSITIVE Sensitive     CEFEPIME <=0.12 SENSITIVE Sensitive     CEFTRIAXONE  <=0.25 SENSITIVE Sensitive     CIPROFLOXACIN <=0.25 SENSITIVE Sensitive     GENTAMICIN <=1 SENSITIVE Sensitive     IMIPENEM <=0.25 SENSITIVE Sensitive     NITROFURANTOIN 64 INTERMEDIATE Intermediate     TRIMETH/SULFA <=20 SENSITIVE Sensitive     AMPICILLIN/SULBACTAM <=2 SENSITIVE Sensitive     PIP/TAZO <=4 SENSITIVE Sensitive ug/mL    * >=100,000 COLONIES/mL KLEBSIELLA PNEUMONIAE    Labs: CBC: Recent Labs  Lab 09/12/23 0359 09/13/23 0253  WBC 5.1 5.5  HGB 7.1* 7.4*  HCT 22.1* 22.7*  MCV 95.7 95.0  PLT 200 211   Basic Metabolic Panel: Recent Labs  Lab 09/12/23 0359 09/13/23 0253 09/15/23 1315 09/17/23 0233  NA 137 136 135 133*  K 4.7 4.5 4.6 5.0  CL 106 104 102 100  CO2 22 21* 21* 22  GLUCOSE 77 88 176* 128*  BUN 88* 92* 90* 80*  CREATININE 2.88* 2.83* 2.66* 2.55*  CALCIUM  8.8* 8.8* 9.3 8.7*   Liver Function Tests: No results for input(s): AST, ALT, ALKPHOS, BILITOT, PROT, ALBUMIN in the last 168 hours. CBG: Recent Labs  Lab 09/17/23 0605 09/17/23 1128 09/17/23 1535 09/17/23 2101 09/18/23 0612  GLUCAP 128* 143* 188* 165* 115*    Discharge time spent: 44 minutes.  Signed: Deliliah Room, MD [facility] 09/18/2023

## 2023-09-18 NOTE — TOC Transition Note (Addendum)
 Transition of Care [facility]) - Discharge Note   Patient Details  Name: [name] MRN: [id number] Date of Birth: Feb 19, 1927  [facility] CM/SW Contact:  [name], LCSWA Phone Number: 09/18/2023, 10:39 AM   Clinical Narrative:   Patient will DC to: [facility]  Anticipated DC date: 09/18/23  Family notified: Lesley (daughter in Social worker) Transport by: [facility]   Per MD patient ready for DC to [facility]. RN to call report prior to discharge [phone]; room 108). RN, patient, patient's family, and facility notified of DC. Discharge Summary and FL2 sent to facility. DC packet on chart. Ambulance transport requested for patient 10:42 AM.   CSW will sign off for now as social work intervention is no longer needed. Please consult us  again if new needs arise.   Final next level of care: Skilled Nursing Facility Barriers to Discharge: Barriers Resolved   Patient Goals and CMS Choice Patient states their goals for this hospitalization and ongoing recovery are:: SNF CMS Medicare.gov Compare Post Acute Care list provided to:: Patient Choice offered to / list presented to : Patient      Discharge Placement              Patient [facility] bed at: [facility] Patient to be transferred to facility by: [facility] Name of family member notified: Lesley (daughter in law) Patient and family notified of of transfer: 09/18/23  Discharge Plan and Services Additional resources added to the After Visit Summary for   In-house Referral: Clinical Social Work Discharge Planning Services: CM Consult Post Acute Care Choice: Home Health          DME Arranged: N/A DME Agency: NA       HH Arranged: PT, OT, RN HH Agency: [facility] Date [facility] Agency Contacted: 09/09/23 Time HH Agency Contacted: 0900 Representative spoke with at [facility] Agency: [facility]  Social Drivers of Health (SDOH) Interventions SDOH Screenings   Food Insecurity: No Food  Insecurity (09/07/2023)  Housing: Low Risk  (09/07/2023)  Transportation Needs: No Transportation Needs (09/07/2023)  Utilities: Not At Risk (09/07/2023)  Alcohol Screen: Low Risk  (11/14/2020)  Depression (PHQ2-9): Low Risk  (03/27/2022)  Financial Resource Strain: Low Risk  (03/27/2022)  Physical Activity: Inactive (03/27/2022)  Social Connections: Moderately Isolated (09/07/2023)  Stress: No Stress Concern Present (03/27/2022)  Tobacco Use: Low Risk  (09/07/2023)     Readmission Risk Interventions    09/09/2023    5:05 PM 08/08/2023   12:05 PM 05/14/2023    4:08 PM  Readmission Risk Prevention Plan  Transportation Screening Complete Complete Complete  Medication Review (RN Care Manager) Complete Complete Referral to Pharmacy  PCP or Specialist appointment within 3-5 days of discharge   Complete  HRI or Home Care Consult Complete Complete Complete  SW Recovery Care/Counseling Consult  Complete Complete  Palliative Care Screening Not Applicable Not Applicable Not Applicable  Skilled Nursing Facility Not Applicable Complete Not Applicable

## 2023-10-22 ENCOUNTER — Encounter: Payer: Self-pay | Admitting: Adult Health

## 2023-10-22 ENCOUNTER — Ambulatory Visit: Payer: Medicare PPO | Admitting: Adult Health
